# Patient Record
Sex: Female | Born: 1940
Health system: Southern US, Community
[De-identification: ages and names within clinical notes are randomized; demographics above are authoritative.]

## PROBLEM LIST (undated history)

## (undated) DIAGNOSIS — I499 Cardiac arrhythmia, unspecified: Secondary | ICD-10-CM

## (undated) DIAGNOSIS — M1990 Unspecified osteoarthritis, unspecified site: Secondary | ICD-10-CM

## (undated) DIAGNOSIS — I4891 Unspecified atrial fibrillation: Secondary | ICD-10-CM

## (undated) DIAGNOSIS — I639 Cerebral infarction, unspecified: Secondary | ICD-10-CM

## (undated) DIAGNOSIS — R011 Cardiac murmur, unspecified: Secondary | ICD-10-CM

## (undated) DIAGNOSIS — E785 Hyperlipidemia, unspecified: Secondary | ICD-10-CM

## (undated) DIAGNOSIS — I509 Heart failure, unspecified: Secondary | ICD-10-CM

## (undated) DIAGNOSIS — I10 Essential (primary) hypertension: Secondary | ICD-10-CM

## (undated) DIAGNOSIS — F419 Anxiety disorder, unspecified: Secondary | ICD-10-CM

## (undated) HISTORY — DX: Cerebral infarction, unspecified: I63.9

## (undated) HISTORY — DX: Hyperlipidemia, unspecified: E78.5

## (undated) HISTORY — DX: Cardiac arrhythmia, unspecified: I49.9

## (undated) HISTORY — PX: ABDOMINAL HYSTERECTOMY: SHX81

## (undated) HISTORY — PX: TUMOR REMOVAL: SHX12

## (undated) HISTORY — DX: Unspecified osteoarthritis, unspecified site: M19.90

## (undated) HISTORY — PX: APPENDECTOMY: SHX54

## (undated) HISTORY — DX: Unspecified atrial fibrillation: I48.91

## (undated) HISTORY — PX: TONSILLECTOMY: SUR1361

## (undated) HISTORY — PX: BLADDER SUSPENSION: SHX72

## (undated) HISTORY — DX: Anxiety disorder, unspecified: F41.9

## (undated) HISTORY — DX: Heart failure, unspecified: I50.9

---

## 2005-09-02 ENCOUNTER — Ambulatory Visit: Payer: Self-pay | Admitting: Family Medicine

## 2005-09-17 ENCOUNTER — Ambulatory Visit: Payer: Self-pay | Admitting: Family Medicine

## 2005-10-08 ENCOUNTER — Ambulatory Visit: Payer: Self-pay | Admitting: Family Medicine

## 2005-10-17 ENCOUNTER — Ambulatory Visit: Payer: Self-pay | Admitting: Family Medicine

## 2005-10-23 ENCOUNTER — Ambulatory Visit: Payer: Self-pay | Admitting: Family Medicine

## 2006-01-09 ENCOUNTER — Ambulatory Visit: Payer: Self-pay | Admitting: Family Medicine

## 2006-08-04 ENCOUNTER — Encounter (INDEPENDENT_AMBULATORY_CARE_PROVIDER_SITE_OTHER): Payer: Self-pay | Admitting: Internal Medicine

## 2006-12-22 ENCOUNTER — Telehealth (INDEPENDENT_AMBULATORY_CARE_PROVIDER_SITE_OTHER): Payer: Self-pay | Admitting: *Deleted

## 2006-12-26 ENCOUNTER — Ambulatory Visit: Payer: Self-pay | Admitting: Family Medicine

## 2006-12-26 DIAGNOSIS — Z8739 Personal history of other diseases of the musculoskeletal system and connective tissue: Secondary | ICD-10-CM

## 2006-12-26 DIAGNOSIS — M722 Plantar fascial fibromatosis: Secondary | ICD-10-CM | POA: Insufficient documentation

## 2006-12-26 DIAGNOSIS — I10 Essential (primary) hypertension: Secondary | ICD-10-CM | POA: Insufficient documentation

## 2006-12-29 LAB — CONVERTED CEMR LAB
Basophils Absolute: 0 10*3/uL (ref 0.0–0.1)
Creatinine, Ser: 0.7 mg/dL (ref 0.4–1.2)
Direct LDL: 171.2 mg/dL
Eosinophils Absolute: 0.1 10*3/uL (ref 0.0–0.6)
Glucose, Bld: 96 mg/dL (ref 70–99)
HCT: 38.1 % (ref 36.0–46.0)
Hemoglobin: 13 g/dL (ref 12.0–15.0)
MCHC: 34.1 g/dL (ref 30.0–36.0)
MCV: 92.7 fL (ref 78.0–100.0)
Monocytes Absolute: 0.5 10*3/uL (ref 0.2–0.7)
Neutrophils Relative %: 53.4 % (ref 43.0–77.0)
Potassium: 3.9 meq/L (ref 3.5–5.1)
Sodium: 140 meq/L (ref 135–145)

## 2007-01-01 ENCOUNTER — Encounter (INDEPENDENT_AMBULATORY_CARE_PROVIDER_SITE_OTHER): Payer: Self-pay | Admitting: Internal Medicine

## 2007-08-05 ENCOUNTER — Encounter (INDEPENDENT_AMBULATORY_CARE_PROVIDER_SITE_OTHER): Payer: Self-pay | Admitting: Internal Medicine

## 2008-01-06 ENCOUNTER — Telehealth (INDEPENDENT_AMBULATORY_CARE_PROVIDER_SITE_OTHER): Payer: Self-pay | Admitting: Internal Medicine

## 2008-01-12 ENCOUNTER — Ambulatory Visit: Payer: Self-pay | Admitting: Internal Medicine

## 2008-01-13 ENCOUNTER — Encounter (INDEPENDENT_AMBULATORY_CARE_PROVIDER_SITE_OTHER): Payer: Self-pay | Admitting: Internal Medicine

## 2008-01-13 LAB — CONVERTED CEMR LAB
ALT: 55 units/L — ABNORMAL HIGH (ref 0–35)
AST: 43 units/L — ABNORMAL HIGH (ref 0–37)
BUN: 10 mg/dL (ref 6–23)
Chloride: 108 meq/L (ref 96–112)
Direct LDL: 118.8 mg/dL
GFR calc non Af Amer: 107 mL/min
Potassium: 4 meq/L (ref 3.5–5.1)
Sodium: 142 meq/L (ref 135–145)
VLDL: 19 mg/dL (ref 0–40)

## 2008-07-06 ENCOUNTER — Ambulatory Visit: Payer: Self-pay | Admitting: Family Medicine

## 2008-07-11 LAB — CONVERTED CEMR LAB
ALT: 28 units/L (ref 0–35)
AST: 29 units/L (ref 0–37)
Albumin: 4 g/dL (ref 3.5–5.2)
Alkaline Phosphatase: 59 units/L (ref 39–117)
Bilirubin, Direct: 0.1 mg/dL (ref 0.0–0.3)
Cholesterol: 218 mg/dL — ABNORMAL HIGH (ref 0–200)
Total Protein: 6.5 g/dL (ref 6.0–8.3)
VLDL: 14.4 mg/dL (ref 0.0–40.0)

## 2008-07-13 ENCOUNTER — Ambulatory Visit: Payer: Self-pay | Admitting: Family Medicine

## 2008-07-29 ENCOUNTER — Encounter (INDEPENDENT_AMBULATORY_CARE_PROVIDER_SITE_OTHER): Payer: Self-pay | Admitting: Internal Medicine

## 2008-08-02 ENCOUNTER — Encounter (INDEPENDENT_AMBULATORY_CARE_PROVIDER_SITE_OTHER): Payer: Self-pay | Admitting: Internal Medicine

## 2008-08-04 ENCOUNTER — Encounter (INDEPENDENT_AMBULATORY_CARE_PROVIDER_SITE_OTHER): Payer: Self-pay | Admitting: *Deleted

## 2008-08-04 ENCOUNTER — Encounter (INDEPENDENT_AMBULATORY_CARE_PROVIDER_SITE_OTHER): Payer: Self-pay | Admitting: Internal Medicine

## 2009-01-24 ENCOUNTER — Ambulatory Visit: Payer: Self-pay | Admitting: Family Medicine

## 2009-02-02 ENCOUNTER — Ambulatory Visit: Payer: Self-pay | Admitting: Family Medicine

## 2009-02-02 DIAGNOSIS — Z780 Asymptomatic menopausal state: Secondary | ICD-10-CM | POA: Insufficient documentation

## 2009-02-03 ENCOUNTER — Encounter (INDEPENDENT_AMBULATORY_CARE_PROVIDER_SITE_OTHER): Payer: Self-pay | Admitting: Internal Medicine

## 2009-02-03 LAB — CONVERTED CEMR LAB
ALT: 20 units/L (ref 0–35)
AST: 24 units/L (ref 0–37)
Calcium: 9 mg/dL (ref 8.4–10.5)
Direct LDL: 160.7 mg/dL
GFR calc non Af Amer: 88.89 mL/min (ref >60)
Potassium: 3.7 meq/L (ref 3.5–5.1)
Sodium: 141 meq/L (ref 135–145)
Total CHOL/HDL Ratio: 4
VLDL: 16.8 mg/dL (ref 0.0–40.0)

## 2009-02-06 ENCOUNTER — Telehealth (INDEPENDENT_AMBULATORY_CARE_PROVIDER_SITE_OTHER): Payer: Self-pay | Admitting: Internal Medicine

## 2009-03-01 ENCOUNTER — Telehealth (INDEPENDENT_AMBULATORY_CARE_PROVIDER_SITE_OTHER): Payer: Self-pay | Admitting: Internal Medicine

## 2009-03-02 ENCOUNTER — Ambulatory Visit: Payer: Self-pay | Admitting: Family Medicine

## 2009-05-24 ENCOUNTER — Ambulatory Visit: Payer: Self-pay | Admitting: Family Medicine

## 2009-05-24 DIAGNOSIS — J0100 Acute maxillary sinusitis, unspecified: Secondary | ICD-10-CM | POA: Insufficient documentation

## 2009-05-24 DIAGNOSIS — J019 Acute sinusitis, unspecified: Secondary | ICD-10-CM

## 2009-05-24 DIAGNOSIS — H02849 Edema of unspecified eye, unspecified eyelid: Secondary | ICD-10-CM

## 2009-06-16 ENCOUNTER — Ambulatory Visit: Payer: Self-pay | Admitting: Family Medicine

## 2009-06-19 ENCOUNTER — Telehealth: Payer: Self-pay | Admitting: Family Medicine

## 2009-06-27 ENCOUNTER — Telehealth: Payer: Self-pay | Admitting: Family Medicine

## 2009-07-11 ENCOUNTER — Ambulatory Visit: Payer: Self-pay | Admitting: Family Medicine

## 2009-07-11 LAB — CONVERTED CEMR LAB
ALT: 32 units/L (ref 0–35)
AST: 32 units/L (ref 0–37)
Cholesterol: 211 mg/dL — ABNORMAL HIGH (ref 0–200)
Direct LDL: 139.5 mg/dL
HDL: 63.8 mg/dL (ref >39.00)
Total CHOL/HDL Ratio: 3
Triglycerides: 79 mg/dL (ref 0.0–149.0)
VLDL: 15.8 mg/dL (ref 0.0–40.0)

## 2009-07-18 ENCOUNTER — Ambulatory Visit: Payer: Self-pay | Admitting: Family Medicine

## 2009-07-18 LAB — CONVERTED CEMR LAB: LDL Cholesterol: 139 mg/dL

## 2009-07-18 LAB — HM COLONOSCOPY

## 2010-01-17 ENCOUNTER — Ambulatory Visit: Payer: Self-pay | Admitting: Family Medicine

## 2010-01-18 LAB — CONVERTED CEMR LAB
ALT: 19 units/L (ref 0–35)
Alkaline Phosphatase: 64 units/L (ref 39–117)
Bilirubin, Direct: 0 mg/dL (ref 0.0–0.3)
CO2: 27 meq/L (ref 19–32)
Calcium: 9.4 mg/dL (ref 8.4–10.5)
Chloride: 109 meq/L (ref 96–112)
Creatinine, Ser: 0.7 mg/dL (ref 0.4–1.2)
Direct LDL: 152.3 mg/dL
HDL: 62.6 mg/dL (ref >39.00)
Sodium: 143 meq/L (ref 135–145)
Total Bilirubin: 0.4 mg/dL (ref 0.3–1.2)
Total CHOL/HDL Ratio: 4
Total Protein: 6.5 g/dL (ref 6.0–8.3)
Triglycerides: 109 mg/dL (ref 0.0–149.0)

## 2010-01-24 ENCOUNTER — Ambulatory Visit: Payer: Self-pay | Admitting: Family Medicine

## 2010-05-01 NOTE — Progress Notes (Signed)
Summary: rash with BP meds  Phone Note Call from Patient Call back at Home Phone [phone]   Caller: Patient Call For: Kerby Nora MD Summary of Call: Pt states she has been having rashes with BP meds.  She has a rash with metoprolol/ hctz.  She cut this in half on saturday night, which helped ease the rash a little.  She is still taking half a dose.  The medicine did relieve the swelling in her feet and ankles.  Please advise on what she needs to do. Initial call taken by: Lowella Petties CMA,  June 27, 2009 8:36 AM  Follow-up for Phone Call        Stop metoprolol HCTZ..I believe she may have cross reactivity allergy to diuretics given her allergy to sulfa. Change to metoprolol alone without diuretic.  Follow-up by: Kerby Nora MD,  June 27, 2009 11:55 AM  Additional Follow-up for Phone Call Additional follow up Details #1::        Patient advised and will try this medication Additional Follow-up by: Benny Lennert CMA Duncan Dull),  June 27, 2009 12:14 PM    New/Updated Medications: METOPROLOL TARTRATE 50 MG TABS (METOPROLOL TARTRATE) 1 tab by mouth two times a day Prescriptions: METOPROLOL TARTRATE 50 MG TABS (METOPROLOL TARTRATE) 1 tab by mouth two times a day  #60 x 11   Entered and Authorized by:   Kerby Nora MD   Signed by:   Kerby Nora MD on 06/27/2009   Method used:   [facility]        [facility]       [address]       [place], [place]  [address]       Ph: [phone] or [phone]       Fax: [phone]   RxID:   [id number]

## 2010-05-01 NOTE — Progress Notes (Signed)
Summary: swelling with diovan  Phone Note Call from Patient Call back at Home Phone [phone]   Caller: Patient Call For: Kerby Nora MD Summary of Call: Pt states she took diovan on friday and saturday and her face started to swell and get a rash. Her feet are also swelling.  She didnt take this yesterday.  She is asking if you know of something else that she can try.  [facility]. Initial call taken by: Lowella Petties CMA,  June 19, 2009 11:30 AM  Follow-up for Phone Call        If continued swelling in face we will given repeat prednisone taper. Let me know. Continue off diovan. Instead start metoprolol HCT twice daily. Follow up as scheduled with Dr. Patsy Lager in April , but call if BP remains above 140/90.   (Note: if continued allergic symtpoms may be cross reactivity between sulfa allergy and diuretics) Follow-up by: Kerby Nora MD,  June 20, 2009 2:04 PM  Additional Follow-up for Phone Call Additional follow up Details #1::        Patient advised.Consuello Masse CMA  Additional Follow-up by: Benny Lennert CMA Duncan Dull),  June 20, 2009 2:15 PM    New/Updated Medications: METOPROLOL-HYDROCHLOROTHIAZIDE 50-25 MG TABS (METOPROLOL-HYDROCHLOROTHIAZIDE)  METOPROLOL-HYDROCHLOROTHIAZIDE 50-25 MG TABS (METOPROLOL-HYDROCHLOROTHIAZIDE) 1 tab by mouth two times a day Prescriptions: METOPROLOL-HYDROCHLOROTHIAZIDE 50-25 MG TABS (METOPROLOL-HYDROCHLOROTHIAZIDE) 1 tab by mouth two times a day  #60 x 3   Entered and Authorized by:   Kerby Nora MD   Signed by:   Kerby Nora MD on 06/20/2009   Method used:   [facility]        [facility] (retail[facility]       [address]       [place], [place]  [address]       Ph: [phone] or [phone]       Fax: [phone]   RxID:   [id number]   Current Allergies (reviewed today): ! CIPRO ! DYAZIDE ! SULFA ! DIOVAN

## 2010-05-01 NOTE — Letter (Signed)
Summary: [facility]   [facility]   Imported By: Lanelle Bal 07/31/2009 09:12:58  _____________________________________________________________________  External Attachment:    Type:   Image     Comment:   External Document

## 2010-05-01 NOTE — Progress Notes (Signed)
Summary: Med List Brought by Patient  Med List Brought by Patient   Imported By: Lanelle Bal 01/31/2010 08:49:35  _____________________________________________________________________  External Attachment:    Type:   Image     Comment:   External Document

## 2010-05-01 NOTE — Assessment & Plan Note (Signed)
Summary: RASH AND SWOLLEN EYE LID   Vital Signs:  Patient profile:   70 year old female Height:      65.25 inches Weight:      139.38 pounds BMI:     23.10 Temp:     98.0 degrees F oral Pulse rate:   76 / minute Pulse rhythm:   regular BP sitting:   140 / 80  (left arm) Cuff size:   regular  Vitals Entered By: Linde Gillis CMA Duncan Dull) (May 24, 2009 8:02 AM) CC: rash, swollen eye, sinus concerns   History of Present Illness: 70 year old female:  Eyes are swollen, some puffiness,mostly on the left side.   started to get a cold in december. Feels something is not right. Having some discharge. May have some yellowish coloration.   Coughing some. Not so severe.   B eye swelling - mostly on the L, mild swelling, no loss of vision, no throat swelling, no swollen tongue.  Acute Visit History:      The patient complains of cough, eye symptoms, headache, nasal discharge, rash, and sinus problems.  These symptoms began 2 months ago.  She denies earache, fever, nausea, and sore throat.        There is no history of wheezing, sleep interference, shortness of breath, respiratory retractions, tachypnea, cyanosis, or interference with oral intake associated with her cough.        She complains of sinus pressure, nasal congestion, purulent drainage, and frontal headache.  The patient has had a past history of sinusitis.  She denies previous sinus surgery.        Urine output has been normal.  She is tolerating clear liquids.        Allergies: 1)  ! Cipro 2)  ! Dyazide 3)  ! Sulfa 4)  ! Diovan  Past History:  Past medical, surgical, family and social histories (including risk factors) reviewed, and no changes noted (except as noted below).  Past Surgical History: Reviewed history from 01/24/2009 and no changes required. colonoscopy --4/04--polyp complete hysterectomy at age 21 for severe endometriosis  Family History: Reviewed history from 12/26/2006 and no changes  required. Father: died at a ge 67--colon cancer Mother:   osteoporosis, valvular heart disease Siblings: 1 br--Chron's   DM- Maunt MI-  CVA- mother's side Prostate Cancer- 0 Breast Cancer- [place] Ovarian Cancer- 0 Uterine Cancer- 0 Colon Cancer- multiple on father's side Drug/ ETOH Abuse- 0 Depression-   Social History: Reviewed history from 12/26/2006 and no changes required. Marital Status: Married Children: 2 sons Occupation: retired Tourist information centre manager.   Review of Systems General:  Complains of fatigue; denies chills and fever. ENT:  See HPI. Derm:  See HPI.  Physical Exam  General:  Well-developed,well-nourished,in no acute distress; alert,appropriate and cooperative throughout examination Head:  Normocephalic and atraumatic without obvious abnormalities. No apparent alopecia or balding. Eyes:  vision grossly intact, pupils equal, pupils round, and pupils reactive to light.    EYELID SWELLING, RELATIVELY MILD, L > R.  Ears:  External ear exam shows no significant lesions or deformities.  Otoscopic examination reveals clear canals, tympanic membranes are intact bilaterally without bulging, retraction, inflammation or discharge. Hearing is grossly normal bilaterally. Nose:  External nasal examination shows no deformity or inflammation. Nasal mucosa are pink and moist without lesions or exudates. Mouth:  Oral mucosa and oropharynx without lesions or exudates.  Teeth in good repair. Neck:  No deformities, masses, or tenderness noted. Lungs:  Normal respiratory effort, chest expands symmetrically.  Lungs are clear to auscultation, no crackles or wheezes. Heart:  Normal rate and regular rhythm. S1 and S2 normal without gallop, murmur, click, rub or other extra sounds. Extremities:  No clubbing, cyanosis, edema, or deformity noted with normal full range of motion of all joints.   Cervical Nodes:  No lymphadenopathy noted Psych:  Cognition and judgment appear intact. Alert and cooperative  with normal attention span and concentration. No apparent delusions, illusions, hallucinations   Impression & Recommendations:  Problem # 1:  SINUSITIS - ACUTE-NOS (ICD-461.9) Assessment New  high dose amox, 1500 mg two times a day     The following medications were removed from the medication list:    Amoxicillin 500 Mg Caps (Amoxicillin) .Marland Kitchen... Take 2 two times a day Her updated medication list for this problem includes:    Amoxicillin 875 Mg Tabs (Amoxicillin) .Marland Kitchen... 1 by mouth two times a day  Instructed on treatment. Call if symptoms persist or worsen.   Problem # 2:  EDEMA, EYELID (ICD-374.82) Assessment: New Concerning -- the patient is on a tremendous number of supplements, this could be very mild angioedema, so I am stopping her ARB.  Could also be a reaction to supplement -- on some she does not know the name of as well  Problem # 3:  HYPERTENSION, BENIGN ESSENTIAL (ICD-401.1) recheck with Dr. Ermalene Searing  The following medications were removed from the medication list:    Diovan 160 Mg Tabs (Valsartan) .Marland Kitchen... Take 1 tablet by mouth once a day Her updated medication list for this problem includes:    Norvasc 10 Mg Tabs (Amlodipine besylate) .Marland Kitchen... 1 by mouth daily  Complete Medication List: 1)  Glucosamine-chondroitin 500-400 Mg Tabs (Glucosamine-chondroitin) .... Take 1 tablet by mouth two times a day 2)  [facility] Evening Primrose Oil 500 Mg Caps (Evening primrose oil) .... Take 4 capsules  by mouth once a day 3)  Silymarin Caps (Milk thistle-turmeric) .... Take 2 capsule by mouth once a day 4)  Bilberry Extract 40 Mg Caps (Bilberry-bioflav-rutin-quercet) .... Take 2 capsules  by mouth once a day 5)  Ginger 500 Mg Caps (Ginger (zingiber officinalis)) .... Take 2 tablets by mouth once a day 6)  Azo-cranberry 450 Mg Tabs (Cranberry) .... Take 1 tablet by mouth once a day 7)  Olive Leaf Extract 500 Mg Caps (Olive leaf) .... Take 1 capsule by mouth once a day 8)  Daily Multiple  Vitamins Tabs (Multiple vitamin) .... Take 3 by mouth every am and pm 9)  Probiotic Caps (Misc intestinal flora regulat) .... Take 2 tablet by mouth once a day 10)  Absinthium Oil ([place] (absinthium) oil) .... As directed 11)  Glutamine 500 Mg Caps (Glutamine) .... Take 1 tablet by mouth once a day 12)  Cinnamon 500 Mg Caps (Cinnamon) .... Take 4 capsules by mouth once a day 13)  Colon Care  .... Take 3 by mouth every pm 14)  Bone-up Build Bone Density  .... Take 2 by mouth two times a day 15)  Vitamin C 1000 Mg Tabs (Ascorbic acid) .... Take one by mouth two times a day 16)  Cayne Pepper Tabs  .... One twice a day 17)  Clotrimazole-betamethasone 1-0.05 % Crea (Clotrimazole-betamethasone) .... Use sparingly two times a day to rash as needed 18)  Amoxicillin 875 Mg Tabs (Amoxicillin) .Marland Kitchen.. 1 by mouth two times a day 19)  Norvasc 10 Mg Tabs (Amlodipine besylate) .Marland Kitchen.. 1 by mouth daily  Patient Instructions: 1)  Benadryl 1 tablet by mouth  q 6 hours for the next few days Prescriptions: NORVASC 10 MG TABS (AMLODIPINE BESYLATE) 1 by mouth daily  #30 x 5   Entered and Authorized by:   Hannah Beat MD   Signed by:   Hannah Beat MD on 05/24/2009   Method used:   [facility]        [id number]       [address]       [place], [place]  [address]       Ph: [phone] or [phone]       Fax: [phone]   RxID:   [id number] AMOXICILLIN 875 MG TABS (AMOXICILLIN) 1 by mouth two times a day  #20 x 0   Entered and Authorized by:   Hannah Beat MD   Signed by:   Hannah Beat MD on 05/24/2009   Method used:   [facility]        [facility]  [address] (retail)       [address]       [place], [place]  [address]       Ph: [phone] or [phone]       Fax: [phone]   RxID:   [id number]   Current Allergies (reviewed today): ! CIPRO ! DYAZIDE ! SULFA ! DIOVAN

## 2010-05-01 NOTE — Assessment & Plan Note (Signed)
Summary: CPX / LFW   Vital Signs:  Patient profile:   70 year old female Height:      65.25 inches Weight:      137.6 pounds BMI:     22.80 Temp:     97.8 degrees F oral Pulse rate:   76 / minute Pulse rhythm:   regular BP sitting:   160 / 98  (left arm) Cuff size:   regular  Vitals Entered By: Benny Lennert CMA Duncan Dull) (July 18, 2009 9:59 AM)  History of Present Illness: Chief complaint cpx  The patient is here for annual wellness exam and preventative care.    HTN, continued poor control on metoprolol. Stopped diovan/HCTZ and metoprolol HCTZ for allergic reaction/angioedema thought to be due to ARB, then may thought to be due to cross reactivity to HCTZ given sulfa allergy.   She felt she had worse symtpoms on metoprolol alone... so she has stopped all BP medication entirely.  BP poorly controlled now.   She states that she will not go through that again..refuses any BP medication despite risk. Has been using dandelion and CoQ 10. Plans on going to naturalist for BP treatment.   See Dr. Gavin Potters for RA..will do bone density  Problems Prior to Update: 1)  Sinusitis - Acute-nos  (ICD-461.9) 2)  Edema, Eyelid  (ICD-374.82) 3)  Postmenopausal Status  (ICD-V49.81) 4)  Health Screening  (ICD-V70.0) 5)  Other Screening Mammogram  (ICD-V76.12) 6)  Arthritis, Rheumatoid  (ICD-714.0) 7)  Hyperlipidemia  (ICD-272.2) 8)  Hypertension, Benign Essential  (ICD-401.1) 9)  Plantar Fasciitis  (ICD-728.71)  Current Medications (verified): 1)  Naprosyn 500 Mg Tabs (Naproxen) .... If Needed One Tablet 2 Times Daily 2)  Astepro 0.15 % Soln (Azelastine Hcl) .... One Spray in The Pm 3)  Gr8- Dophilus .... Daily 4)  Cholest Off 450 Mg Tabs (Plant Sterols and Stanols) .... Daily 5)  Colon Care .... Daily 6)  Quercetin 50 Mg Tabs (Quercetin) .... Sometimes 7)  Serrapeptase 8)  Bee Pollen 580 Mg Caps (Bee Pollen) .... Daily 9)  Oil of Oregano 1500 Mg Caps (Oregano) .... Daily 10)   Multivitamins  Caps (Multiple Vitamin) .... Daily 11)  Bone Up .... Daily 12)  Osteo Bi-Flex Adv Double St  Tabs (Misc Natural Products) .... Daily 13)  Cissus 14)  Ginger Root 500 Mg Caps (Ginger (Zingiber Officinalis)) .... Daily 15)  Urinary Tract Health 365-50 Mg Caps (Cranberry-Olive Leaf) .... Daily in Am 16)  Milk Thistle 300 Mg Caps (Milk Thistle) .... Daily 17)  Lutein 8 Mg Caps (Lutein) .... Daily For Eyes 18)  Evening Primrose Oil 500 Mg Caps (Evening Primrose Oil) .... 1000mg  Nightly 19)  Ashwagandha 20)  Black Cohosh 540 Mg Caps (Black Cohosh) .... Once Daily 21)  Red Wine Extract Plus  Caps (Misc Natural Products) .... 200mg  Daily 22)  Super-C 1000  Tabs (Bioflavonoid Products) .... 2000 Mg Daily 23)  Magnesium Oxide 250 Mg Tabs (Magnesium Oxide) .... Daily 24)  Ayr 0.65 % Soln (Saline) .... Sometime 25)  Saline Allergy With Butterbur Extract .... Sometimes 26)  Now Activated Nasal With Erythritol and Xylitol and Natural .... Sometimes 27)  Co-Enzyme Q-10 30 Mg Caps (Coenzyme Q10) .... Daily 28)  Dandelion Root 520 Mg Caps (Misc Natural Products) .... Daily 29)  Cinnamon 500 Mg Tabs (Cinnamon) .... Daily  Allergies: 1)  ! Cipro 2)  ! Dyazide 3)  ! Sulfa 4)  ! Diovan  Past History:  Past medical, surgical, family  and social histories (including risk factors) reviewed, and no changes noted (except as noted below).  Past Surgical History: Reviewed history from 01/24/2009 and no changes required. colonoscopy --4/04--polyp complete hysterectomy at age 27 for severe endometriosis  Family History: Reviewed history from 12/26/2006 and no changes required. Father: died at a ge 67--colon cancer Mother:   osteoporosis, valvular heart disease Siblings: 1 br--Chron's   DM- Maunt MI-  CVA- mother's side Prostate Cancer- 0 Breast Cancer- [place] Ovarian Cancer- 0 Uterine Cancer- 0 Colon Cancer- multiple on father's side Drug/ ETOH Abuse- 0 Depression-   Social  History: Reviewed history from 12/26/2006 and no changes required. Marital Status: Married Children: 2 sons Occupation: retired Tourist information centre manager.   Review of Systems General:  Denies fatigue and fever. CV:  Denies chest pain or discomfort. Resp:  Denies shortness of breath. GI:  Denies abdominal pain. GU:  Denies dysuria.  Physical Exam  General:  Well-developed,well-nourished,in no acute distress; alert,appropriate and cooperative throughout examination Eyes:  vision grossly intact, pupils equal, pupils round, and pupils reactive to light.    EYELID SWELLING, RELATIVELY MILD, L > R.  Ears:  External ear exam shows no significant lesions or deformities.  Otoscopic examination reveals clear canals, tympanic membranes are intact bilaterally without bulging, retraction, inflammation or discharge. Hearing is grossly normal bilaterally. Nose:  External nasal examination shows no deformity or inflammation. Nasal mucosa are pink and moist without lesions or exudates. Mouth:  Oral mucosa and oropharynx without lesions or exudates.  Teeth in good repair. Neck:  no carotid bruit or thyromegaly no cervical or supraclavicular lymphadenopathy  Chest Wall:  No deformities, masses, or tenderness noted. Breasts:  No mass, nodules, thickening, tenderness, bulging, retraction, inflamation, nipple discharge or skin changes noted.   Lungs:  Normal respiratory effort, chest expands symmetrically. Lungs are clear to auscultation, no crackles or wheezes. Heart:  Normal rate and regular rhythm. S1 and S2 normal without gallop, murmur, click, rub or other extra sounds. Abdomen:  Bowel sounds positive,abdomen soft and non-tender without masses, organomegaly or hernias noted. Genitalia:  No indication given complete hysterectomy Pulses:  R and L posterior tibial pulses are full and equal bilaterally  Extremities:  no edema  Psych:  slightly anxious.     Impression & Recommendations:  Problem # 1:  HYPERTENSION, BENIGN  ESSENTIAL (ICD-401.1) Poor control, but refuses any medication..understands risksof HTN, poor control...wishes to try natural treatment. Will call if interested in treatment.  Spent >than 30- min in discussion with [pt and coordination of care.  The following medications were removed from the medication list:    Diovan Hct 160-12.5 Mg Tabs (Valsartan-hydrochlorothiazide) .Marland Kitchen... Take 1 tablet by mouth once a day    Metoprolol Tartrate 50 Mg Tabs (Metoprolol tartrate) .Marland Kitchen... 1 tab by mouth two times a day  Problem # 2:  HYPERLIPIDEMIA (ICD-272.2) Well controlled. Encouraged exercise, weight loss, healthy eating habits.  Labs Reviewed: SGOT: 32 (07/11/2009)   SGPT: 32 (07/11/2009)   HDL:63.80 (07/11/2009), 57.50 (02/02/2009)  LDL:139 (07/18/2009), DEL (01/12/2008)  Chol:211 (07/11/2009), 234 (02/02/2009)  Trig:79.0 (07/11/2009), 84.0 (02/02/2009)  Problem # 3:  Preventive Health Care (ICD-V70.0) Assessment: Comment Only Reviewed preventive care protocols, scheduled due services, and updated immunizations.   Complete Medication List: 1)  Naprosyn 500 Mg Tabs (Naproxen) .... If needed one tablet 2 times daily 2)  Astepro 0.15 % Soln (Azelastine hcl) .... One spray in the pm 3)  Gr8- Dophilus  .... Daily 4)  Cholest Off 450 Mg Tabs (Plant sterols and stanols) .Marland KitchenMarland KitchenMarland Kitchen  Daily 5)  Colon Care  .... Daily 6)  Quercetin 50 Mg Tabs (Quercetin) .... Sometimes 7)  Serrapeptase  8)  Bee Pollen 580 Mg Caps (Bee pollen) .... Daily 9)  Oil of Oregano 1500 Mg Caps (Oregano) .... Daily 10)  Multivitamins Caps (Multiple vitamin) .... Daily 11)  Bone Up  .... Daily 12)  Osteo Bi-flex Adv Double St Tabs (Misc natural products) .... Daily 13)  Cissus  14)  Ginger Root 500 Mg Caps (Ginger (zingiber officinalis)) .... Daily 15)  Urinary Tract Health 365-50 Mg Caps (Cranberry-olive leaf) .... Daily in am 16)  Milk Thistle 300 Mg Caps (Milk thistle) .... Daily 17)  Lutein 8 Mg Caps (Lutein) .... Daily for eyes 18)   Evening Primrose Oil 500 Mg Caps (Evening primrose oil) .... 1000mg  nightly 19)  Ashwagandha  20)  Black Cohosh 540 Mg Caps (Black cohosh) .... Once daily 21)  Red Wine Extract Plus Caps (Misc natural products) .... 200mg  daily 22)  Super-c 1000 Tabs (Bioflavonoid products) .... 2000 mg daily 23)  Magnesium Oxide 250 Mg Tabs (Magnesium oxide) .... Daily 24)  Ayr 0.65 % Soln (Saline) .... Sometime 25)  Saline Allergy With Butterbur Extract  .... Sometimes 26)  Now Activated Nasal With Erythritol and Xylitol and Natural  .... Sometimes 27)  Co-enzyme Q-10 30 Mg Caps (Coenzyme q10) .... Daily 28)  Dandelion Root 520 Mg Caps (Misc natural products) .... Daily 29)  Cinnamon 500 Mg Tabs (Cinnamon) .... Daily  Patient Instructions: 1)  Please schedule a follow-up appointment in 6 months  30 min.  2)  BMP prior to visit, ICD-9: 272.0 3)  Hepatic Panel prior to visit ICD-9:  4)  Lipid panel prior to visit ICD-9 :  5)  Call for nurse visit if interested in shingles vaccine or pneumonia vaccine.Call if interested in BP treatment.   Current Allergies (reviewed today): ! CIPRO ! DYAZIDE ! SULFA ! DIOVAN  Last HDL:  63.80 (07/11/2009 10:13:26 AM) HDL Next Due:  6 mo Last LDL:  DEL (01/12/2008 11:05:00 AM) LDL Result Date:  07/18/2009 LDL Result:  139 LDL Next Due:  6 mo Flex Sig Next Due:  Not Indicated Hemoccult Next Due:  Not Indicated Last PAP:  normal (04/01/1978 6:06:06 PM) PAP Next Due:  Not Indicated Last Mammogram:  normal (08/04/2008 11:34:02 AM) Mammogram Next Due:  Refused     Past Surgical History:    Reviewed history from 01/24/2009 and no changes required:       colonoscopy --4/04--polyp       complete hysterectomy at age 49 for severe endometriosis

## 2010-05-01 NOTE — Assessment & Plan Note (Signed)
Summary: 8 M F/U 30 MIN PER MD/DLO   Vital Signs:  Patient profile:   70 year old female Height:      65.25 inches Weight:      138.0 pounds BMI:     22.87 Temp:     97.7 degrees F oral Pulse rate:   76 / minute Pulse rhythm:   regular BP sitting:   150 / 90  (left arm) Cuff size:   regular  Vitals Entered By: Benny Lennert CMA Duncan Dull) (January 24, 2010 10:55 AM)  History of Present Illness: Chief complaint 6 month follow up    HTN, inadequate control.Marland KitchenMarland KitchenShe has ben using natural meds to treat (she is on probably 69 natural suplements).. refuses prescription due to past SE and intolerance.  Was not exercising regularly  Problems Prior to Update: 1)  Sinusitis - Acute-nos  (ICD-461.9) 2)  Edema, Eyelid  (ICD-374.82) 3)  Postmenopausal Status  (ICD-V49.81) 4)  Health Screening  (ICD-V70.0) 5)  Other Screening Mammogram  (ICD-V76.12) 6)  Arthritis, Rheumatoid  (ICD-714.0) 7)  Hyperlipidemia  (ICD-272.2) 8)  Hypertension, Benign Essential  (ICD-401.1) 9)  Plantar Fasciitis  (ICD-728.71)  Current Medications (verified): 1)  Naprosyn 500 Mg Tabs (Naproxen) .... If Needed One Tablet 2 Times Daily 2)  Astepro 0.15 % Soln (Azelastine Hcl) .... One Spray in The Pm 3)  Gr8- Dophilus .... Daily 4)  Cholest Off 450 Mg Tabs (Plant Sterols and Stanols) .... Daily 5)  Colon Care .... Daily 6)  Quercetin 50 Mg Tabs (Quercetin) .... Sometimes 7)  Serrapeptase 8)  Bee Pollen 580 Mg Caps (Bee Pollen) .... Daily 9)  Oil of Oregano 1500 Mg Caps (Oregano) .... Daily 10)  Multivitamins  Caps (Multiple Vitamin) .... Daily 11)  Bone Up .... Daily 12)  Osteo Bi-Flex Adv Double St  Tabs (Misc Natural Products) .... Daily 13)  Cissus 14)  Ginger Root 500 Mg Caps (Ginger (Zingiber Officinalis)) .... Daily 15)  Urinary Tract Health 365-50 Mg Caps (Cranberry-Olive Leaf) .... Daily in Am 16)  Milk Thistle 300 Mg Caps (Milk Thistle) .... Daily 17)  Lutein 8 Mg Caps (Lutein) .... Daily For Eyes 18)   Evening Primrose Oil 500 Mg Caps (Evening Primrose Oil) .... 1000mg  Nightly 19)  Ashwagandha 20)  Black Cohosh 540 Mg Caps (Black Cohosh) .... Once Daily 21)  Red Wine Extract Plus  Caps (Misc Natural Products) .... 200mg  Daily 22)  Super-C 1000  Tabs (Bioflavonoid Products) .... 2000 Mg Daily 23)  Magnesium Oxide 250 Mg Tabs (Magnesium Oxide) .... Daily 24)  Ayr 0.65 % Soln (Saline) .... Sometime 25)  Saline Allergy With Butterbur Extract .... Sometimes 26)  Now Activated Nasal With Erythritol and Xylitol and Natural .... Sometimes 27)  Co-Enzyme Q-10 30 Mg Caps (Coenzyme Q10) .... Daily 28)  Dandelion Root 520 Mg Caps (Misc Natural Products) .... Daily 29)  Cinnamon 500 Mg Tabs (Cinnamon) .... Daily 30)  Bell .... Blood Pressure Combo 31)  Fiber Smart .Marland Kitchen.. 1 Scoop Every Am 32)  Lechithin .... One- Once or Twice Daily 33)  Tumeric .... One Tablet 2 Times Daily 34)  Olive Leaf Extract 500 Mg Caps (Olive Leaf) .... One Tablet Daily 35)  Cranberry Fruit 405 Mg Caps (Cranberry) .... One Daily 36)  Evening Primrose Oil 1000 Mg Caps (Evening Primrose Oil) .... One Tablet Dialy  Allergies (verified): 1)  ! Cipro 2)  ! Dyazide 3)  ! Sulfa 4)  ! Diovan  Past History:  Past medical, surgical, family  and social histories (including risk factors) reviewed, and no changes noted (except as noted below).  Past Surgical History: Reviewed history from 01/24/2009 and no changes required. colonoscopy --4/04--polyp complete hysterectomy at age 64 for severe endometriosis  Family History: Reviewed history from 12/26/2006 and no changes required. Father: died at a ge 67--colon cancer Mother:   osteoporosis, valvular heart disease Siblings: 1 br--Chron's   DM- Maunt MI-  CVA- mother's side Prostate Cancer- 0 Breast Cancer- [place] Ovarian Cancer- 0 Uterine Cancer- 0 Colon Cancer- multiple on father's side Drug/ ETOH Abuse- 0 Depression-   Social History: Reviewed history from  12/26/2006 and no changes required. Marital Status: Married Children: 2 sons Occupation: retired Tourist information centre manager.   Review of Systems General:  Denies fatigue and fever. CV:  Denies chest pain or discomfort; Recent palpitations with allergies  cayenne pepper. Resp:  Denies shortness of breath. GI:  Denies abdominal pain. GU:  Denies dysuria.  Physical Exam  General:  Well-developed,well-nourished,in no acute distress; alert,appropriate and cooperative throughout examination Nose:  External nasal examination shows no deformity or inflammation. Nasal mucosa are pink and moist without lesions or exudates. Mouth:  Oral mucosa and oropharynx without lesions or exudates.  Teeth in good repair. Neck:  no carotid bruit or thyromegaly no cervical or supraclavicular lymphadenopathy  Lungs:  Normal respiratory effort, chest expands symmetrically. Lungs are clear to auscultation, no crackles or wheezes. Heart:  Normal rate and regular rhythm. S1 and S2 normal without gallop, murmur, click, rub or other extra sounds. Abdomen:  Bowel sounds positive,abdomen soft and non-tender without masses, organomegaly or hernias noted. Pulses:  R and L posterior tibial pulses are full and equal bilaterally  Extremities:  no edema Skin:  Intact without suspicious lesions or rashes Psych:  Cognition and judgment appear intact. Alert and cooperative with normal attention span and concentration. No apparent delusions, illusions, hallucinations   Impression & Recommendations:  Problem # 1:  HYPERTENSION, BENIGN ESSENTIAL (ICD-401.1) poor control.Charolette Forward refuses prescription medication...despite risk of complications. Will scontinue to treat naturally.   Problem # 2:  HYPERLIPIDEMIA (ICD-272.2) Inadequate control...counseld on lifestyle and diet changes.  Complete Medication List: 1)  Naprosyn 500 Mg Tabs (Naproxen) .... If needed one tablet 2 times daily 2)  Astepro 0.15 % Soln (Azelastine hcl) .... One spray in  the pm 3)  Gr8- Dophilus  .... Daily 4)  Cholest Off 450 Mg Tabs (Plant sterols and stanols) .... Daily 5)  Colon Care  .... Daily 6)  Quercetin 50 Mg Tabs (Quercetin) .... Sometimes 7)  Serrapeptase  8)  Bee Pollen 580 Mg Caps (Bee pollen) .... Daily 9)  Oil of Oregano 1500 Mg Caps (Oregano) .... Daily 10)  Multivitamins Caps (Multiple vitamin) .... Daily 11)  Bone Up  .... Daily 12)  Osteo Bi-flex Adv Double St Tabs (Misc natural products) .... Daily 13)  Cissus  14)  Ginger Root 500 Mg Caps (Ginger (zingiber officinalis)) .... Daily 15)  Urinary Tract Health 365-50 Mg Caps (Cranberry-olive leaf) .... Daily in am 16)  Milk Thistle 300 Mg Caps (Milk thistle) .... Daily 17)  Lutein 8 Mg Caps (Lutein) .... Daily for eyes 18)  Evening Primrose Oil 500 Mg Caps (Evening primrose oil) .... 1000mg  nightly 19)  Ashwagandha  20)  Black Cohosh 540 Mg Caps (Black cohosh) .... Once daily 21)  Red Wine Extract Plus Caps (Misc natural products) .... 200mg  daily 22)  Super-c 1000 Tabs (Bioflavonoid products) .... 2000 mg daily 23)  Magnesium Oxide 250 Mg Tabs (  Magnesium oxide) .... Daily 24)  Ayr 0.65 % Soln (Saline) .... Sometime 25)  Saline Allergy With Butterbur Extract  .... Sometimes 26)  Now Activated Nasal With Erythritol and Xylitol and Natural  .... Sometimes 27)  Co-enzyme Q-10 30 Mg Caps (Coenzyme q10) .... Daily 28)  Dandelion Root 520 Mg Caps (Misc natural products) .... Daily 29)  Cinnamon 500 Mg Tabs (Cinnamon) .... Daily 30)  Bell  .... Blood pressure combo 31)  Fiber Smart  .Marland Kitchen.. 1 scoop every am 32)  Lechithin  .... One- once or twice daily 33)  Tumeric  .... One tablet 2 times daily 34)  Olive Leaf Extract 500 Mg Caps (Olive leaf) .... One tablet daily 35)  Cranberry Fruit 405 Mg Caps (Cranberry) .... One daily 36)  Evening Primrose Oil 1000 Mg Caps (Evening primrose oil) .... One tablet dialy  Patient Instructions: 1)  Work on low salt siet. 2)   Decrease cheese in  diet..change to 2% cheese, stop butter. 3)   Increase exercisie. 4)  Scheduled CPX in 6 months with fasting labs pror Dx 2 72.0CMEt, lipids  5)  call if interested in prescripton med for high blood pressure.    Orders Added: 1)  Est. Patient Level IV [04540]    Prior Medications: NAPROSYN 500 MG TABS (NAPROXEN) if needed one tablet 2 times daily ASTEPRO 0.15 % SOLN (AZELASTINE HCL) one spray in the pm GR8- DOPHILUS () daily CHOLEST OFF 450 MG TABS (PLANT STEROLS AND STANOLS) daily COLON CARE () daily QUERCETIN 50 MG TABS (QUERCETIN) sometimes SERRAPEPTASE ()  BEE POLLEN 580 MG CAPS (BEE POLLEN) daily OIL OF OREGANO 1500 MG CAPS (OREGANO) daily MULTIVITAMINS  CAPS (MULTIPLE VITAMIN) Daily BONE UP () daily OSTEO BI-FLEX ADV DOUBLE ST  TABS (MISC NATURAL PRODUCTS) daily CISSUS ()  GINGER ROOT 500 MG CAPS (GINGER (ZINGIBER OFFICINALIS)) DAILY URINARY TRACT HEALTH 365-50 MG CAPS (CRANBERRY-OLIVE LEAF) DAILY IN AM MILK THISTLE 300 MG CAPS (MILK THISTLE) DAILY LUTEIN 8 MG CAPS (LUTEIN) DAILY FOR EYES EVENING PRIMROSE OIL 500 MG CAPS (EVENING PRIMROSE OIL) 1000MG  NIGHTLY ASHWAGANDHA ()  BLACK COHOSH 540 MG CAPS (BLACK COHOSH) ONCE DAILY RED WINE EXTRACT PLUS  CAPS (MISC NATURAL PRODUCTS) 200mg  daily SUPER-C 1000  TABS (BIOFLAVONOID PRODUCTS) 2000 mg daily MAGNESIUM OXIDE 250 MG TABS (MAGNESIUM OXIDE) daily AYR 0.65 % SOLN (SALINE) sometime SALINE ALLERGY WITH BUTTERBUR EXTRACT () sometimes NOW ACTIVATED NASAL WITH ERYTHRITOL AND XYLITOL AND NATURAL () sometimes CO-ENZYME Q-10 30 MG CAPS (COENZYME Q10) daily DANDELION ROOT 520 MG CAPS (MISC NATURAL PRODUCTS) daily CINNAMON 500 MG TABS (CINNAMON) daily Current Allergies (reviewed today): ! CIPRO ! DYAZIDE ! SULFA ! DIOVAN  Flu Vaccine Next Due:  Refused Pneumovax Next Due:  Refused Herpes Zoster Next Due:  Refused

## 2010-05-01 NOTE — Assessment & Plan Note (Signed)
Summary: ROA 3 WKS  CYD   Vital Signs:  Patient profile:   70 year old female Height:      65.25 inches Weight:      138.6 pounds BMI:     22.97 Temp:     97.5 degrees F oral Pulse rate:   76 / minute Pulse rhythm:   regular BP sitting:   140 / 80  (left arm) Cuff size:   regular  Vitals Entered By: Benny Lennert CMA Duncan Dull) (June 16, 2009 11:54 AM)  History of Present Illness: Chief complaint 3 wk follow up  HTN, poor control..changed BP meds from ACE to norvasc due to ? angioedema 1 months ago Has increase in peripheral swelling. Bp not as well controlled on this new medicaiton.  Felt better on diovan overall.    Took amoxicillin for sinus infection, no improvement Saw ENT.. Dr. Andee Poles..gave rx's for astepro, clarithromycin and prednisone. HAs issue with chronic recurrent sinusitis. No further swelling in face.     Problems Prior to Update: 1)  Sinusitis - Acute-nos  (ICD-461.9) 2)  Edema, Eyelid  (ICD-374.82) 3)  Postmenopausal Status  (ICD-V49.81) 4)  Health Screening  (ICD-V70.0) 5)  Other Screening Mammogram  (ICD-V76.12) 6)  Arthritis, Rheumatoid  (ICD-714.0) 7)  Hyperlipidemia  (ICD-272.2) 8)  Hypertension, Benign Essential  (ICD-401.1) 9)  Plantar Fasciitis  (ICD-728.71)  Current Medications (verified): 1)  Naprosyn 500 Mg Tabs (Naproxen) .... If Needed One Tablet 2 Times Daily 2)  Astepro 0.15 % Soln (Azelastine Hcl) .... One Spray in The Pm 3)  Gr8- Dophilus .... Daily 4)  Cholest Off 450 Mg Tabs (Plant Sterols and Stanols) .... Daily 5)  Colon Care .... Daily 6)  Quercetin 50 Mg Tabs (Quercetin) .... Sometimes 7)  Serrapeptase 8)  Bee Pollen 580 Mg Caps (Bee Pollen) .... Daily 9)  Oil of Oregano 1500 Mg Caps (Oregano) .... Daily 10)  Multivitamins  Caps (Multiple Vitamin) .... Daily 11)  Bone Up .... Daily 12)  Osteo Bi-Flex Adv Double St  Tabs (Misc Natural Products) .... Daily 13)  Cissus 14)  Ginger Root 500 Mg Caps (Ginger (Zingiber  Officinalis)) .... Daily 15)  Urinary Tract Health 365-50 Mg Caps (Cranberry-Olive Leaf) .... Daily in Am 16)  Milk Thistle 300 Mg Caps (Milk Thistle) .... Daily 17)  Lutein 8 Mg Caps (Lutein) .... Daily For Eyes 18)  Evening Primrose Oil 500 Mg Caps (Evening Primrose Oil) .... 1000mg  Nightly 19)  Ashwagandha 20)  Black Cohosh 540 Mg Caps (Black Cohosh) .... Once Daily 21)  Red Wine Extract Plus  Caps (Misc Natural Products) .... 200mg  Daily 22)  Super-C 1000  Tabs (Bioflavonoid Products) .... 2000 Mg Daily 23)  Magnesium Oxide 250 Mg Tabs (Magnesium Oxide) .... Daily 24)  Ayr 0.65 % Soln (Saline) .... Sometime 25)  Saline Allergy With Butterbur Extract .... Sometimes 26)  Now Activated Nasal With Erythritol and Xylitol and Natural .... Sometimes 27)  Diovan Hct 160-12.5 Mg Tabs (Valsartan-Hydrochlorothiazide) .... Take 1 Tablet By Mouth Once A Day  Allergies: 1)  ! Cipro 2)  ! Dyazide 3)  ! Sulfa 4)  ! Diovan  Past History:  Past medical, surgical, family and social histories (including risk factors) reviewed, and no changes noted (except as noted below).  Past Surgical History: Reviewed history from 01/24/2009 and no changes required. colonoscopy --4/04--polyp complete hysterectomy at age 77 for severe endometriosis  Family History: Reviewed history from 12/26/2006 and no changes required. Father: died at a ge 67--colon  cancer Mother:   osteoporosis, valvular heart disease Siblings: 1 br--Chron's   DM- Maunt MI-  CVA- mother's side Prostate Cancer- 0 Breast Cancer- [place] Ovarian Cancer- 0 Uterine Cancer- 0 Colon Cancer- multiple on father's side Drug/ ETOH Abuse- 0 Depression-   Social History: Reviewed history from 12/26/2006 and no changes required. Marital Status: Married Children: 2 sons Occupation: retired Tourist information centre manager.   Review of Systems General:  Denies fatigue and fever. CV:  Denies chest pain or discomfort. Resp:  Denies shortness of breath.  Physical  Exam  General:  Well-developed,well-nourished,in no acute distress; alert,appropriate and cooperative throughout examination Head:  no maxillary sinus ttp  Ears:  External ear exam shows no significant lesions or deformities.  Otoscopic examination reveals clear canals, tympanic membranes are intact bilaterally without bulging, retraction, inflammation or discharge. Hearing is grossly normal bilaterally. Nose:  External nasal examination shows no deformity or inflammation. Nasal mucosa are pink and moist without lesions or exudates. Mouth:  Oral mucosa and oropharynx without lesions or exudates.  Teeth in good repair. Neck:  no carotid bruit or thyromegaly no cervical or supraclavicular lymphadenopathy  Lungs:  Normal respiratory effort, chest expands symmetrically. Lungs are clear to auscultation, no crackles or wheezes. Heart:  Normal rate and regular rhythm. S1 and S2 normal without gallop, murmur, click, rub or other extra sounds. Pulses:  R and L posterior tibial pulses are full and equal bilaterally  Extremities:  no edema    Impression & Recommendations:  Problem # 1:  HYPERTENSION, BENIGN ESSENTIAL (ICD-401.1) Bothersome peripheral edema on amlodipine..recommend trial to restart diovan, but if any facial swelling..stop immediately.  Stop norvasc.  The following medications were removed from the medication list:    Norvasc 10 Mg Tabs (Amlodipine besylate) .Marland Kitchen... 1 by mouth daily Her updated medication list for this problem includes:    Diovan Hct 160-12.5 Mg Tabs (Valsartan-hydrochlorothiazide) .Marland Kitchen... Take 1 tablet by mouth once a day  Problem # 2:  EDEMA, EYELID (ICD-374.82) Unclear cause..? secondary to sinus infection given resolution with broader antibitoics...although also took prednisone taper at same time.   Complete Medication List: 1)  Naprosyn 500 Mg Tabs (Naproxen) .... If needed one tablet 2 times daily 2)  Astepro 0.15 % Soln (Azelastine hcl) .... One spray in the  pm 3)  Gr8- Dophilus  .... Daily 4)  Cholest Off 450 Mg Tabs (Plant sterols and stanols) .... Daily 5)  Colon Care  .... Daily 6)  Quercetin 50 Mg Tabs (Quercetin) .... Sometimes 7)  Serrapeptase  8)  Bee Pollen 580 Mg Caps (Bee pollen) .... Daily 9)  Oil of Oregano 1500 Mg Caps (Oregano) .... Daily 10)  Multivitamins Caps (Multiple vitamin) .... Daily 11)  Bone Up  .... Daily 12)  Osteo Bi-flex Adv Double St Tabs (Misc natural products) .... Daily 13)  Cissus  14)  Ginger Root 500 Mg Caps (Ginger (zingiber officinalis)) .... Daily 15)  Urinary Tract Health 365-50 Mg Caps (Cranberry-olive leaf) .... Daily in am 16)  Milk Thistle 300 Mg Caps (Milk thistle) .... Daily 17)  Lutein 8 Mg Caps (Lutein) .... Daily for eyes 18)  Evening Primrose Oil 500 Mg Caps (Evening primrose oil) .... 1000mg  nightly 19)  Ashwagandha  20)  Black Cohosh 540 Mg Caps (Black cohosh) .... Once daily 21)  Red Wine Extract Plus Caps (Misc natural products) .... 200mg  daily 22)  Super-c 1000 Tabs (Bioflavonoid products) .... 2000 mg daily 23)  Magnesium Oxide 250 Mg Tabs (Magnesium oxide) .... Daily 24)  Ayr 0.65 % Soln (Saline) .... Sometime 25)  Saline Allergy With Butterbur Extract  .... Sometimes 26)  Now Activated Nasal With Erythritol and Xylitol and Natural  .... Sometimes 27)  Diovan Hct 160-12.5 Mg Tabs (Valsartan-hydrochlorothiazide) .... Take 1 tablet by mouth once a day  Patient Instructions: 1)  MAke sure next April appt scheduled for CPX.  OV.  2)  Follow BPs at home.  3)  Goal <140/90.  4)  Stop amlodipine. 5)  Restart diovan. 6)  Call immediately if any facial swelling and stop diovan.   Current Allergies (reviewed today): ! CIPRO ! DYAZIDE ! SULFA ! DIOVAN

## 2010-05-31 ENCOUNTER — Encounter: Payer: Self-pay | Admitting: Family Medicine

## 2010-07-11 ENCOUNTER — Telehealth: Payer: Self-pay | Admitting: Family Medicine

## 2010-07-11 DIAGNOSIS — E782 Mixed hyperlipidemia: Secondary | ICD-10-CM

## 2010-07-11 DIAGNOSIS — I10 Essential (primary) hypertension: Secondary | ICD-10-CM

## 2010-07-11 NOTE — Telephone Encounter (Signed)
Message copied by Kerby Nora on Wed Jul 11, 2010  6:49 PM ------      Message from: Melody Comas      Created: Wed Jul 11, 2010 11:29 AM      Regarding: CPX labs for Monday        Patient having cpx labs, please order future labs.

## 2010-07-16 ENCOUNTER — Other Ambulatory Visit (INDEPENDENT_AMBULATORY_CARE_PROVIDER_SITE_OTHER): Payer: Medicare PPO | Admitting: Family Medicine

## 2010-07-16 DIAGNOSIS — I10 Essential (primary) hypertension: Secondary | ICD-10-CM

## 2010-07-16 DIAGNOSIS — E782 Mixed hyperlipidemia: Secondary | ICD-10-CM

## 2010-07-16 LAB — COMPREHENSIVE METABOLIC PANEL
ALT: 18 U/L (ref 0–35)
AST: 23 U/L (ref 0–37)
Creatinine, Ser: 0.7 mg/dL (ref 0.4–1.2)
GFR: 88.5 mL/min (ref >60.00)
Total Bilirubin: 0.5 mg/dL (ref 0.3–1.2)

## 2010-07-16 LAB — LIPID PANEL
HDL: 70.6 mg/dL (ref >39.00)
Total CHOL/HDL Ratio: 4
VLDL: 24 mg/dL (ref 0.0–40.0)

## 2010-07-20 ENCOUNTER — Encounter: Payer: Self-pay | Admitting: Family Medicine

## 2010-07-20 ENCOUNTER — Ambulatory Visit (INDEPENDENT_AMBULATORY_CARE_PROVIDER_SITE_OTHER): Payer: Medicare PPO | Admitting: Family Medicine

## 2010-07-20 DIAGNOSIS — Z1212 Encounter for screening for malignant neoplasm of rectum: Secondary | ICD-10-CM

## 2010-07-20 DIAGNOSIS — I10 Essential (primary) hypertension: Secondary | ICD-10-CM

## 2010-07-20 DIAGNOSIS — E782 Mixed hyperlipidemia: Secondary | ICD-10-CM

## 2010-07-20 DIAGNOSIS — Z0000 Encounter for general adult medical examination without abnormal findings: Secondary | ICD-10-CM

## 2010-07-20 DIAGNOSIS — R002 Palpitations: Secondary | ICD-10-CM

## 2010-07-20 MED ORDER — AMLODIPINE BESYLATE 2.5 MG PO TABS: 2.5000 mg | ORAL_TABLET | Freq: Every day | ORAL | Status: DC

## 2010-07-20 NOTE — Patient Instructions (Addendum)
Start fish oil 2400 mg daily for cholesterol.  Call if interested in pneumonia vaccine and shingles vaccine. Also let us know if interested in mammogram before next year...as well as bone density. Return stool cards. Start low dose amlodipine 2.5 mg daily... Follow BPs.. Call if remain >140/90 in 1 week or if SE on this medication.

## 2010-07-20 NOTE — Assessment & Plan Note (Addendum)
Poor control cholesterol. Start fish oil 2400 mg daily for cholesterol.

## 2010-07-20 NOTE — Assessment & Plan Note (Addendum)
EKG showed no specific changes.. Pt without CP or exertional fatigue, SOB. Restarting calcium channel blocker should help with this issue.

## 2010-07-20 NOTE — Assessment & Plan Note (Addendum)
Poor control today in office. On no medication for this issue.  In the past she has had ? Angioedema to ACEI, ARB.... ? Reaction to HCTZ given sulfa allergy.  Also on metoprolol made her feel "BAD' and amlodipine 10 mg cause peripheral swelling.  We will try lower dose amlodipine to see if better BP control without peripheral swelling.  Start low dose amlodipine 2.5 mg daily... Follow BPs.. Call if remain >140/90 in 1 week or if SE on this medication.

## 2010-07-20 NOTE — Progress Notes (Signed)
Subjective:    Patient ID: Karen Bowman, female    DOB: 1940/11/16, 70 y.o.   MRN: [id number]  HPI I have personally reviewed the Medicare Annual Wellness questionnaire and have noted 1. The patient's medical and social history 2. Their use of alcohol, tobacco or illicit drugs 3. Their current medications and supplements 4. The patient's functional ability including ADL's, fall risks, home safety risks and hearing or visual             impairment. 5. Diet and physical activities 6. Evidence for depression or mood disorders The patients weight, height, BMI and visual acuity have been recorded in the chart I have made referrals, counseling and provided education to the patient based review of the above and I have provided the pt with a written personalized care plan for preventive services.   Rheumatoid arthritis: Seeing Dr. Gavin Potters once a year. Maintained with naprosyn. DEXA done in 2010.Marland Kitchen Not interested in repeating yet.    HTN .Marland Kitchen Poor control... Pt very hesitant about medicaion SE. HAs bad experience in past. Has not been checking BP at home. Occ having palpitations ...resolves if laying still. Some anxiety associated with it. No chest pain, no SOB. Did not sleep well last night.  Also having some left sided buttock pain... Occurred in past. Cramps in left side. No numbness, no weakness.    High cholesterol...poor control. Red yeast rice helped in past... Dropped it 30-40 points.  Open to restarting this.  She eats very low fat diet, getting regular exercsie.    Review of Systems  Constitutional: Negative for fever and fatigue.  HENT: Negative for ear pain.   Eyes: Negative for pain.  Respiratory: Negative for chest tightness and shortness of breath.   Cardiovascular: Negative for chest pain and leg swelling.  Gastrointestinal: Negative for abdominal pain.  Genitourinary: Negative for dysuria.  Musculoskeletal: Positive for back pain. Negative for joint swelling  and gait problem.       Objective:   Physical Exam  Constitutional: Vital signs are normal. She appears well-developed and well-nourished. She is cooperative.  Non-toxic appearance. She does not appear ill. No distress.  HENT:  Head: Normocephalic.  Right Ear: Hearing, tympanic membrane, external ear and ear canal normal.  Left Ear: Hearing, tympanic membrane, external ear and ear canal normal.  Nose: Nose normal.  Eyes: Conjunctivae, EOM and lids are normal. Pupils are equal, round, and reactive to light. No foreign bodies found.  Neck: Trachea normal and normal range of motion. Neck supple. Carotid bruit is not present. No mass and no thyromegaly present.  Cardiovascular: Normal rate, regular rhythm, S1 normal, S2 normal, normal heart sounds and intact distal pulses.  Exam reveals no gallop.   No murmur heard. Pulmonary/Chest: Effort normal and breath sounds normal. No respiratory distress. She has no wheezes. She has no rhonchi. She has no rales.  Abdominal: Soft. Normal appearance and bowel sounds are normal. She exhibits no distension, no fluid wave, no abdominal bruit and no mass. There is no hepatosplenomegaly. There is no tenderness. There is no rebound, no guarding and no CVA tenderness. No hernia.  Genitourinary: Vagina normal and uterus normal. No breast swelling, tenderness, discharge or bleeding. Pelvic exam was performed with patient prone. There is no rash, tenderness or lesion on the right labia. There is no rash, tenderness or lesion on the left labia. Uterus is not enlarged and not tender. Cervix exhibits no motion tenderness, no discharge and no friability. Right adnexum displays no mass, no  tenderness and no fullness. Left adnexum displays no mass, no tenderness and no fullness.  Lymphadenopathy:    She has no cervical adenopathy.    She has no axillary adenopathy.  Neurological: She is alert. She has normal strength. No cranial nerve deficit or sensory deficit.  Skin: Skin  is warm, dry and intact. No rash noted.  Psychiatric: Her speech is normal and behavior is normal. Judgment normal. Her mood appears not anxious. Cognition and memory are normal. She does not exhibit a depressed mood.          Assessment & Plan:  Complete Physical Exam: The patient's preventative maintenance and recommended screening tests for an annual wellness exam were reviewed in full today. Brought up to date unless services declined.  Counselled on the importance of diet, exercise, and its role in overall health and mortality. The patient's FH and SH was reviewed, including their home life, tobacco status, and drug and alcohol status.   No indication for PAP or DVE.Marland Kitchen Has had TAH.  Declines most prevention.. Wishes to wait until feeling better.

## 2010-07-20 NOTE — Progress Notes (Deleted)
I have personally reviewed the Medicare Annual Wellness questionnaire and have noted 1. The patient's medical and social history 2. Their use of alcohol, tobacco or illicit drugs 3. Their current medications and supplements 4. The patient's functional ability including ADL's, fall risks, home safety risks and hearing or visual             impairment. 5. Diet and physical activities 6. Evidence for depression or mood disorders The patients weight, height, BMI and visual acuity have been recorded in the chart I have made referrals, counseling and provided education to the patient based review of the above and I have provided the pt with a written personalized care plan for preventive services.

## 2010-07-30 NOTE — Progress Notes (Signed)
  Subjective:    Patient ID: Karen Bowman, female    DOB: 1941-03-20, 70 y.o.   MRN: [id number]  HPI    Review of Systems     Objective:   Physical Exam  Correction to physical exam... No GYN exam performed, no pap. Please eliminate this from records. No change for pelvic exam. Breast exam was performed.        Assessment & Plan:

## 2010-08-02 NOTE — Progress Notes (Signed)
Addended byMills Koller on: 08/02/2010 11:31 AM   Modules accepted: Orders

## 2010-08-03 ENCOUNTER — Other Ambulatory Visit: Payer: Medicare PPO

## 2010-08-03 ENCOUNTER — Other Ambulatory Visit (INDEPENDENT_AMBULATORY_CARE_PROVIDER_SITE_OTHER): Payer: Medicare PPO | Admitting: Family Medicine

## 2010-08-03 DIAGNOSIS — Z1212 Encounter for screening for malignant neoplasm of rectum: Secondary | ICD-10-CM

## 2010-10-16 ENCOUNTER — Other Ambulatory Visit (INDEPENDENT_AMBULATORY_CARE_PROVIDER_SITE_OTHER): Payer: Medicare PPO | Admitting: Family Medicine

## 2010-10-16 DIAGNOSIS — E782 Mixed hyperlipidemia: Secondary | ICD-10-CM

## 2010-10-16 LAB — LIPID PANEL
Cholesterol: 247 mg/dL — ABNORMAL HIGH (ref 0–200)
Total CHOL/HDL Ratio: 4
Triglycerides: 97 mg/dL (ref 0.0–149.0)
VLDL: 19.4 mg/dL (ref 0.0–40.0)

## 2010-10-18 ENCOUNTER — Telehealth: Payer: Self-pay | Admitting: Family Medicine

## 2010-10-18 DIAGNOSIS — E7800 Pure hypercholesterolemia, unspecified: Secondary | ICD-10-CM

## 2010-10-18 NOTE — Telephone Encounter (Signed)
Patient notified. She says that she is going to think about restarting the red yeast rice and will call back. She will call back to schedule labs.

## 2010-10-18 NOTE — Telephone Encounter (Signed)
Notify pt that LDL chol is higher than last check despite lifestyle changes and fish oil. I would recommend restarting red yeast rice and recheck in 3 months.  Add to med list if pt agreeable, if not let me know.

## 2011-04-09 ENCOUNTER — Encounter: Payer: Self-pay | Admitting: Family Medicine

## 2011-04-09 ENCOUNTER — Other Ambulatory Visit: Payer: Self-pay | Admitting: *Deleted

## 2011-04-09 DIAGNOSIS — K589 Irritable bowel syndrome without diarrhea: Secondary | ICD-10-CM | POA: Insufficient documentation

## 2011-04-09 MED ORDER — NAPROXEN 500 MG PO TABS: 500.0000 mg | ORAL_TABLET | Freq: Two times a day (BID) | ORAL | Status: DC | PRN

## 2011-04-09 MED ORDER — AMLODIPINE BESYLATE 2.5 MG PO TABS: 2.5000 mg | ORAL_TABLET | Freq: Every day | ORAL | Status: DC

## 2011-08-02 ENCOUNTER — Other Ambulatory Visit: Payer: Self-pay

## 2011-08-02 MED ORDER — AMLODIPINE BESYLATE 2.5 MG PO TABS: 2.5000 mg | ORAL_TABLET | Freq: Every day | ORAL | Status: DC

## 2011-08-02 MED ORDER — NAPROXEN 500 MG PO TABS: 500.0000 mg | ORAL_TABLET | Freq: Two times a day (BID) | ORAL | Status: DC | PRN

## 2011-08-02 NOTE — Telephone Encounter (Signed)
Okay to fill as requested

## 2011-08-02 NOTE — Telephone Encounter (Signed)
Pt left note at front desk requesting 3 month supply with refills for Naproxen 500 mg taking twice a day and Amlodipine 2.5 mg taking one daily sent to [facility] fax [phone]. Pt also request 30 day supply while waiting on mail order for amlodipine 2.5 mg sent to [facility]. Pt last seen 07/20/10. No future appt scheduled.Please advise. Pt can be reached [phone].

## 2011-10-16 ENCOUNTER — Ambulatory Visit: Payer: Self-pay | Admitting: Ophthalmology

## 2011-12-11 ENCOUNTER — Ambulatory Visit: Payer: Self-pay | Admitting: Ophthalmology

## 2012-08-26 ENCOUNTER — Other Ambulatory Visit: Payer: Self-pay | Admitting: *Deleted

## 2012-08-26 MED ORDER — AMLODIPINE BESYLATE 2.5 MG PO TABS: 2.5000 mg | ORAL_TABLET | Freq: Every day | ORAL | Status: DC

## 2012-08-26 MED ORDER — NAPROXEN 500 MG PO TABS: 500.0000 mg | ORAL_TABLET | Freq: Two times a day (BID) | ORAL | Status: DC | PRN

## 2012-08-26 NOTE — Telephone Encounter (Signed)
Received refill request from [facility]. I called pt and left message for her to return call to office. She has not been seen since 4/12.

## 2012-09-01 ENCOUNTER — Other Ambulatory Visit (INDEPENDENT_AMBULATORY_CARE_PROVIDER_SITE_OTHER): Payer: Medicare PPO

## 2012-09-01 ENCOUNTER — Telehealth: Payer: Self-pay | Admitting: Family Medicine

## 2012-09-01 DIAGNOSIS — E782 Mixed hyperlipidemia: Secondary | ICD-10-CM

## 2012-09-01 LAB — LIPID PANEL
Cholesterol: 264 mg/dL — ABNORMAL HIGH (ref 0–200)
HDL: 57.9 mg/dL (ref >39.00)
VLDL: 23 mg/dL (ref 0.0–40.0)

## 2012-09-01 LAB — COMPREHENSIVE METABOLIC PANEL
BUN: 15 mg/dL (ref 6–23)
CO2: 27 mEq/L (ref 19–32)
Creatinine, Ser: 0.7 mg/dL (ref 0.4–1.2)
GFR: 85.14 mL/min (ref >60.00)
Glucose, Bld: 93 mg/dL (ref 70–99)
Total Bilirubin: 0.6 mg/dL (ref 0.3–1.2)

## 2012-09-01 NOTE — Telephone Encounter (Signed)
Message copied by Excell Seltzer on Tue Sep 01, 2012  8:29 AM ------      Message from: Alvina Chou      Created: Thu Aug 27, 2012 11:07 AM      Regarding: lab orders for Tuesday, 6.3.14       Patient is scheduled for CPX labs, please order future labs, Thanks , Terri       ------

## 2012-09-04 ENCOUNTER — Ambulatory Visit (INDEPENDENT_AMBULATORY_CARE_PROVIDER_SITE_OTHER): Payer: Medicare PPO | Admitting: Family Medicine

## 2012-09-04 ENCOUNTER — Encounter: Payer: Self-pay | Admitting: Family Medicine

## 2012-09-04 VITALS — BP 120/84 | HR 86 | Temp 98.3°F | Ht 65.5 in | Wt 138.0 lb

## 2012-09-04 DIAGNOSIS — Z1212 Encounter for screening for malignant neoplasm of rectum: Secondary | ICD-10-CM

## 2012-09-04 DIAGNOSIS — E782 Mixed hyperlipidemia: Secondary | ICD-10-CM

## 2012-09-04 DIAGNOSIS — Z0000 Encounter for general adult medical examination without abnormal findings: Secondary | ICD-10-CM

## 2012-09-04 DIAGNOSIS — I10 Essential (primary) hypertension: Secondary | ICD-10-CM

## 2012-09-04 MED ORDER — AMLODIPINE BESYLATE 2.5 MG PO TABS: 2.5000 mg | ORAL_TABLET | Freq: Every day | ORAL | Status: DC

## 2012-09-04 MED ORDER — BETAMETHASONE DIPROPIONATE 0.05 % EX CREA: TOPICAL_CREAM | Freq: Two times a day (BID) | CUTANEOUS | Status: DC

## 2012-09-04 NOTE — Progress Notes (Signed)
HPI  I have personally reviewed the Medicare Annual Wellness questionnaire and have noted  1. The patient's medical and social history  2. Their use of alcohol, tobacco or illicit drugs  3. Their current medications and supplements  4. The patient's functional ability including ADL's, fall risks, home safety risks and hearing or visual  impairment.  5. Diet and physical activities  6. Evidence for depression or mood disorders  The patients weight, height, BMI and visual acuity have been recorded in the chart  I have made referrals, counseling and provided education to the patient based review of the above and I have provided the pt with a written personalized care plan for preventive services.   Rheumatoid arthritis: tolerable, not interesteds in meds other than natural meds for this. Seeing Dr. Gavin Potters in past, not routinely. Maintained with naprosyn.   HTN Well controlled on amlodipine. Using medication without problems or lightheadedness: None Chest pain with exertion:None Edema:None Short of breath:None Average home BPs: well controlled. Other issues:.  No numbness, no weakness.   High cholesterol...poor control.  Using coQ 10. Squid oil. Lab Results  Component Value Date   CHOL 264* 09/01/2012   HDL 57.90 09/01/2012   LDLCALC 139 07/18/2009   LDLDIRECT 182.4 09/01/2012   TRIG 115.0 09/01/2012   CHOLHDL 5 09/01/2012  She eats very low fat diet, getting regular exercsie.   She has not tolerated red yeast rice well.  History   Social History  . Marital Status: Married    Spouse Name: N/A    Number of Children: 2  . Years of Education: N/A   Occupational History  . retired Dipl[employer] Services operational officer    Social History Main Topics  . Smoking status: Never Smoker   . Smokeless tobacco: Never Used  . Alcohol Use: None  . Drug Use: None  . Sexually Active: None   Other Topics Concern  . None   Social History Narrative   Exercise:walking 2-3 times a week   End of life issues: Has living  will.. Husband is HCPOA. Full code (reviewed 2014)    Review of Systems  Constitutional: Negative for fever and fatigue.  HENT: Negative for ear pain.  Eyes: Negative for pain.  Respiratory: Negative for chest tightness and shortness of breath.  Cardiovascular: Negative for chest pain and leg swelling.  Gastrointestinal: Negative for abdominal pain.  Genitourinary: Negative for dysuria.  Musculoskeletal: Positive for back pain. Negative for joint swelling and gait problem.  Objective:   Physical Exam  Constitutional: Vital signs are normal. She appears well-developed and well-nourished. She is cooperative. Non-toxic appearance. She does not appear ill. No distress.  HENT:  Head: Normocephalic.  Right Ear: Hearing, tympanic membrane, external ear and ear canal normal.  Left Ear: Hearing, tympanic membrane, external ear and ear canal normal.  Nose: Nose normal.  Eyes: Conjunctivae, EOM and lids are normal. Pupils are equal, round, and reactive to light. No foreign bodies found.  Neck: Trachea normal and normal range of motion. Neck supple. Carotid bruit is not present. No mass and no thyromegaly present.  Cardiovascular: Normal rate, regular rhythm, S1 normal, S2 normal, normal heart sounds and intact distal pulses. Exam reveals no gallop.  No murmur heard.  Pulmonary/Chest: Effort normal and breath sounds normal. No respiratory distress. She has no wheezes. She has no rhonchi. She has no rales.  Abdominal: Soft. Normal appearance and bowel sounds are normal. She exhibits no distension, no fluid wave, no abdominal bruit and no mass. There is no  hepatosplenomegaly. There is no tenderness. There is no rebound, no guarding and no CVA tenderness. No hernia.  Genitourinary: Vagina normal and uterus normal. No breast swelling, tenderness, discharge or bleeding. Pelvic exam was performed with patient prone. There is no rash, tenderness or lesion on the right labia. There is no rash, tenderness or  lesion on the left labia. Uterus is not enlarged and not tender. Cervix exhibits no motion tenderness, no discharge and no friability. Right adnexum displays no mass, no tenderness and no fullness. Left adnexum displays no mass, no tenderness and no fullness.  Lymphadenopathy:  She has no cervical adenopathy.  She has no axillary adenopathy.  Neurological: She is alert. She has normal strength. No cranial nerve deficit or sensory deficit.  Skin: Skin is warm, dry and intact. No rash noted.  Psychiatric: Her speech is normal and behavior is normal. Judgment normal. Her mood appears not anxious. Cognition and memory are normal. She does not exhibit a depressed mood.  Assessment & Plan:   Complete Physical Exam:  The patient's preventative maintenance and recommended screening tests for an annual wellness exam were reviewed in full today.  Brought up to date unless services declined.  Counselled on the importance of diet, exercise, and its role in overall health and mortality.  The patient's FH and SH was reviewed, including their home life, tobacco status, and drug and alcohol status.    DEXA done in 2010.Marland Kitchen Not interested in repeating yet.  Vaccines:Due for PNA and shingles Due for mammogram DVE/pap: not indicated, s/p hysterectomy Colon:ifob in 2012 neg, plan to repeat

## 2012-09-04 NOTE — Assessment & Plan Note (Addendum)
Poor control. She refuses statin and had SE to red yeast rice. She will look into welchol as well as natural treatments. Increase exercire and fiber.

## 2012-09-04 NOTE — Patient Instructions (Addendum)
Consider welchol for cholesterol control.  Increase exercise. Increase fiber in diet, can try plant sterols for cholesterol control.  Change to 2% cheese or part skim milk cheese.  Stop creamer change to skim milk.  Change to egg white. Schedule bone density and mammogram on your own.  Stop at lab to pick up stool test.  Schedule fasting labs in 6 months.

## 2012-09-04 NOTE — Assessment & Plan Note (Signed)
Well controlled. Continue current medication.  

## 2012-11-10 ENCOUNTER — Other Ambulatory Visit: Payer: Self-pay | Admitting: Ophthalmology

## 2012-11-10 LAB — ALBUMIN: Albumin: 4.1 g/dL (ref 3.4–5.0)

## 2013-01-04 ENCOUNTER — Other Ambulatory Visit: Payer: Self-pay | Admitting: *Deleted

## 2013-01-04 NOTE — Telephone Encounter (Signed)
Last office visit 09/04/2012.  Ok to refill? 

## 2013-01-05 ENCOUNTER — Other Ambulatory Visit: Payer: Self-pay | Admitting: *Deleted

## 2013-01-05 MED ORDER — NAPROXEN 500 MG PO TABS: 500.0000 mg | ORAL_TABLET | Freq: Two times a day (BID) | ORAL | Status: DC | PRN

## 2013-01-07 NOTE — Telephone Encounter (Signed)
Pt left v/m requesting status of naprosyn refill; called back and spoke with pts husband and advised refilled on 01/04/13; pt to ck with [facility],

## 2013-03-08 ENCOUNTER — Other Ambulatory Visit (INDEPENDENT_AMBULATORY_CARE_PROVIDER_SITE_OTHER): Payer: Medicare PPO

## 2013-03-08 DIAGNOSIS — E7800 Pure hypercholesterolemia, unspecified: Secondary | ICD-10-CM

## 2013-03-08 LAB — LDL CHOLESTEROL, DIRECT: Direct LDL: 206 mg/dL

## 2013-03-08 LAB — LIPID PANEL
Total CHOL/HDL Ratio: 5
Triglycerides: 93 mg/dL (ref 0.0–149.0)

## 2013-03-08 NOTE — Addendum Note (Signed)
Addended by: Alvina Chou on: 03/08/2013 12:18 PM   Modules accepted: Orders

## 2013-03-09 NOTE — Progress Notes (Signed)
Noted  

## 2013-06-22 ENCOUNTER — Other Ambulatory Visit: Payer: Self-pay

## 2013-06-22 MED ORDER — NAPROXEN 500 MG PO TABS: 500.0000 mg | ORAL_TABLET | Freq: Two times a day (BID) | ORAL | Status: DC | PRN

## 2013-06-22 NOTE — Telephone Encounter (Signed)
Pt left v/m requesting refill naproxen to [facility]. Pt request cb when refilled.Please advise.

## 2013-06-23 NOTE — Telephone Encounter (Signed)
Left message for Karen Bowman that refill request has been sent in to [facility].

## 2013-11-24 ENCOUNTER — Other Ambulatory Visit: Payer: Self-pay | Admitting: *Deleted

## 2013-11-24 MED ORDER — NAPROXEN 500 MG PO TABS: 500.0000 mg | ORAL_TABLET | Freq: Two times a day (BID) | ORAL | Status: DC | PRN

## 2013-11-24 MED ORDER — AMLODIPINE BESYLATE 2.5 MG PO TABS: 2.5000 mg | ORAL_TABLET | Freq: Every day | ORAL | Status: DC

## 2013-11-25 ENCOUNTER — Other Ambulatory Visit: Payer: Self-pay | Admitting: Family Medicine

## 2013-11-26 MED ORDER — NAPROXEN 500 MG PO TABS: 500.0000 mg | ORAL_TABLET | Freq: Two times a day (BID) | ORAL | Status: DC | PRN

## 2013-11-26 MED ORDER — NAPROXEN 500 MG PO TABS
500.0000 mg | ORAL_TABLET | Freq: Two times a day (BID) | ORAL | Status: DC | PRN
Start: 2013-11-26 — End: 2014-02-01

## 2013-11-26 NOTE — Addendum Note (Signed)
Addended by: Carter Kitten on: 11/26/2013 10:02 AM   Modules accepted: Orders

## 2013-11-26 NOTE — Addendum Note (Signed)
Addended by: Carter Kitten on: 11/26/2013 09:10 AM   Modules accepted: Orders

## 2014-01-26 ENCOUNTER — Telehealth: Payer: Self-pay | Admitting: Family Medicine

## 2014-01-26 DIAGNOSIS — E782 Mixed hyperlipidemia: Secondary | ICD-10-CM

## 2014-01-26 NOTE — Telephone Encounter (Signed)
Message copied by Jinny Sanders on Wed Jan 26, 2014 10:43 PM ------      Message from: Ellamae Sia      Created: Wed Jan 19, 2014  5:40 PM      Regarding: Lab orders for Thursday,10.29.15       Patient is scheduled for CPX labs, please order future labs, Thanks , Terri       ------

## 2014-01-27 ENCOUNTER — Other Ambulatory Visit (INDEPENDENT_AMBULATORY_CARE_PROVIDER_SITE_OTHER): Payer: Medicare PPO

## 2014-01-27 DIAGNOSIS — E782 Mixed hyperlipidemia: Secondary | ICD-10-CM

## 2014-01-27 LAB — COMPREHENSIVE METABOLIC PANEL
ALBUMIN: 3.8 g/dL (ref 3.5–5.2)
ALK PHOS: 72 U/L (ref 39–117)
ALT: 18 U/L (ref 0–35)
AST: 25 U/L (ref 0–37)
BILIRUBIN TOTAL: 0.8 mg/dL (ref 0.2–1.2)
BUN: 19 mg/dL (ref 6–23)
CO2: 27 mEq/L (ref 19–32)
Calcium: 9.5 mg/dL (ref 8.4–10.5)
Chloride: 105 mEq/L (ref 96–112)
Creatinine, Ser: 0.8 mg/dL (ref 0.4–1.2)
GFR: 75.09 mL/min (ref >60.00)
Glucose, Bld: 94 mg/dL (ref 70–99)
Potassium: 4 mEq/L (ref 3.5–5.1)
Sodium: 138 mEq/L (ref 135–145)
Total Protein: 7.2 g/dL (ref 6.0–8.3)

## 2014-01-27 LAB — LIPID PANEL
CHOLESTEROL: 286 mg/dL — AB (ref 0–200)
HDL: 60.3 mg/dL (ref >39.00)
LDL CALC: 206 mg/dL — AB (ref 0–99)
NonHDL: 225.7
TRIGLYCERIDES: 99 mg/dL (ref 0.0–149.0)
Total CHOL/HDL Ratio: 5
VLDL: 19.8 mg/dL (ref 0.0–40.0)

## 2014-02-01 ENCOUNTER — Ambulatory Visit (INDEPENDENT_AMBULATORY_CARE_PROVIDER_SITE_OTHER): Payer: Medicare PPO | Admitting: Family Medicine

## 2014-02-01 ENCOUNTER — Encounter: Payer: Self-pay | Admitting: Family Medicine

## 2014-02-01 VITALS — BP 182/93 | HR 78 | Temp 98.2°F | Ht 65.0 in | Wt 139.2 lb

## 2014-02-01 DIAGNOSIS — Z1211 Encounter for screening for malignant neoplasm of colon: Secondary | ICD-10-CM

## 2014-02-01 DIAGNOSIS — Z0000 Encounter for general adult medical examination without abnormal findings: Secondary | ICD-10-CM

## 2014-02-01 DIAGNOSIS — I10 Essential (primary) hypertension: Secondary | ICD-10-CM

## 2014-02-01 DIAGNOSIS — E782 Mixed hyperlipidemia: Secondary | ICD-10-CM

## 2014-02-01 DIAGNOSIS — Z515 Encounter for palliative care: Secondary | ICD-10-CM | POA: Insufficient documentation

## 2014-02-01 MED ORDER — NAPROXEN 500 MG PO TABS: 500.0000 mg | ORAL_TABLET | Freq: Two times a day (BID) | ORAL | Status: DC | PRN

## 2014-02-01 MED ORDER — AMLODIPINE BESYLATE 2.5 MG PO TABS: ORAL_TABLET | ORAL | Status: DC

## 2014-02-01 MED ORDER — BETAMETHASONE DIPROPIONATE 0.05 % EX CREA: TOPICAL_CREAM | Freq: Two times a day (BID) | CUTANEOUS | Status: DC

## 2014-02-01 NOTE — Assessment & Plan Note (Signed)
Likely elevated due to recent anxiety. Follow at home. Increase med if continuing to be high at follow up.

## 2014-02-01 NOTE — Addendum Note (Signed)
Addended by: Carter Kitten on: 02/01/2014 03:44 PM   Modules accepted: Orders

## 2014-02-01 NOTE — Progress Notes (Signed)
HPI  I have personally reviewed the Medicare Annual Wellness questionnaire and have noted  1. The patient's medical and social history  2. Their use of alcohol, tobacco or illicit drugs  3. Their current medications and supplements  4. The patient's functional ability including ADL's, fall risks, home safety risks and hearing or visual  impairment.  5. Diet and physical activities  6. Evidence for depression or mood disorders  The patients weight, height, BMI and visual acuity have been recorded in the chart  I have made referrals, counseling and provided education to the patient based review of the above and I have provided the pt with a written personalized care plan for preventive services.   Rheumatoid arthritis: tolerable, not interesteds in meds other than natural meds for this. Seeing Dr. Jefm Bryant in past, not routinely. Maintained with naprosyn.  Brother in law died 2 weeks ago. Husband with new heart issues.  Increase in stress. She has been anxious.  No depression.  HTN: previously welll controlled on amlodipine. She feels that her BP is up due to her feeling anxious. BP Readings from Last 3 Encounters:  02/01/14 182/93  09/04/12 120/84  07/20/10 160/90  Using medication without problems or lightheadedness: None Chest pain with exertion:None Edema:None Short of breath:None Average home BPs: not checking at home. Other issues:.    High cholesterol...poor control. Using coQ 10. Squid oil. Lab Results  Component Value Date   CHOL 286* 01/27/2014   HDL 60.30 01/27/2014   LDLCALC 206* 01/27/2014   LDLDIRECT 206.0 03/08/2013   TRIG 99.0 01/27/2014   CHOLHDL 5 01/27/2014  She eats very low fat diet, getting regular exercsie.  She has not tolerated red yeast rice well. Not interested in statin meds.    History   Social History  . Marital Status: Married    Spouse Name: N/A    Number of Children: 2  . Years of Education: N/A   Occupational History   . retired Network engineer    Social History Main Topics  . Smoking status: Never Smoker   . Smokeless tobacco: Never Used  . Alcohol Use: None  . Drug Use: None  . Sexual Activity: None   Other Topics Concern  . None   Social History Narrative   Exercise:walking 2-3 times a week   End of life issues: Has living will.. Husband is HCPOA. Full code (reviewed 2014)   Review of Systems  Constitutional: Negative for fever and fatigue.  HENT: Negative for ear pain.  Eyes: Negative for pain.  Respiratory: Negative for chest tightness and shortness of breath.  Cardiovascular: Negative for chest pain and leg swelling.  Gastrointestinal: Negative for abdominal pain.  Genitourinary: Negative for dysuria.  Musculoskeletal: Positive for back pain. Negative for joint swelling and gait problem.  Objective:   Physical Exam  Constitutional: Vital signs are normal. She appears well-developed and well-nourished. She is cooperative. Non-toxic appearance. She does not appear ill. No distress.  HENT:  Head: Normocephalic.  Right Ear: Hearing, tympanic membrane, external ear and ear canal normal.  Left Ear: Hearing, tympanic membrane, external ear and ear canal normal.  Nose: Nose normal.  Eyes: Conjunctivae, EOM and lids are normal. Pupils are equal, round, and reactive to light. No foreign bodies found.  Neck: Trachea normal and normal range of motion. Neck supple. Carotid bruit is not present. No mass and no thyromegaly present.  Cardiovascular: Normal rate, regular rhythm, S1 normal, S2 normal, normal heart sounds and intact distal pulses. Exam reveals no  gallop.  No murmur heard.  Pulmonary/Chest: Effort normal and breath sounds normal. No respiratory distress. She has no wheezes. She has no rhonchi. She has no rales.  Abdominal: Soft. Normal appearance and bowel sounds are normal. She exhibits no distension, no fluid wave, no abdominal bruit and no mass. There is no  hepatosplenomegaly. There is no tenderness. There is no rebound, no guarding and no CVA tenderness. No hernia.  Genitourinary: not indicated Breast exam: no breast masses or nipple changes Bilaterally. Lymphadenopathy:  She has no cervical adenopathy.  She has no axillary adenopathy.  Neurological: She is alert. She has normal strength. No cranial nerve deficit or sensory deficit.  Skin: Skin is warm, dry and intact. No rash noted.  Psychiatric: Her speech is normal and behavior is normal. Judgment normal. Her mood appears not anxious. Cognition and memory are normal. She does not exhibit a depressed mood.  Assessment & Plan:   Complete Physical Exam:  The patient's preventative maintenance and recommended screening tests for an annual wellness exam were reviewed in full today.  Brought up to date unless services declined.  Counselled on the importance of diet, exercise, and its role in overall health and mortality.  The patient's FH and SH was reviewed, including their home life, tobacco status, and drug and alcohol status.    DEXA done in 2010.Marland Kitchen Not interested in repeating yet.  Vaccines:Due for PNA and shingles. SE to flu vaccine in past.  She is not interested. Due for mammogram,never went last year, not interested in repeating. DVE/pap: not indicated, s/p hysterectomy Colon:never sent back ifob 2014, wants to do this year

## 2014-02-01 NOTE — Patient Instructions (Addendum)
Work on stress reduction, relaxation.  Follow BP at home , call if consistently > 140/90  Follow up in 1 month for BP check.  Look into welchol for high cholesterol.  Continue working on low cholesterol diet.  Continue exercise as able.  Stop at lab on way [address] to pick up stool test.

## 2014-02-01 NOTE — Assessment & Plan Note (Signed)
Pt refuses statin. Intolerant of red yeast rice. She will consider welchol.

## 2014-02-01 NOTE — Addendum Note (Signed)
Addended by: Carter Kitten on: 02/01/2014 05:09 PM   Modules accepted: Orders

## 2014-02-01 NOTE — Progress Notes (Signed)
Pre visit review using our clinic review tool, if applicable. No additional management support is needed unless otherwise documented below in the visit note. 

## 2014-02-02 ENCOUNTER — Telehealth: Payer: Self-pay | Admitting: Family Medicine

## 2014-02-02 NOTE — Addendum Note (Signed)
Addended by: Carter Kitten on: 02/02/2014 05:21 PM   Modules accepted: Medications

## 2014-02-02 NOTE — Addendum Note (Signed)
Addended by: Carter Kitten on: 02/02/2014 05:00 PM   Modules accepted: Orders, Medications

## 2014-02-02 NOTE — Telephone Encounter (Signed)
emmi emailed °

## 2014-03-11 ENCOUNTER — Encounter: Payer: Self-pay | Admitting: Family Medicine

## 2014-03-11 ENCOUNTER — Ambulatory Visit (INDEPENDENT_AMBULATORY_CARE_PROVIDER_SITE_OTHER): Payer: Medicare PPO | Admitting: Family Medicine

## 2014-03-11 VITALS — BP 120/80 | HR 73 | Temp 97.6°F | Ht 65.0 in | Wt 142.5 lb

## 2014-03-11 DIAGNOSIS — I10 Essential (primary) hypertension: Secondary | ICD-10-CM

## 2014-03-11 NOTE — Progress Notes (Signed)
Pre visit review using our clinic review tool, if applicable. No additional management support is needed unless otherwise documented below in the visit note. 

## 2014-03-11 NOTE — Assessment & Plan Note (Signed)
Back in nml range. Encouraged exercise, weight maintenance and , healthy eating habits.

## 2014-03-11 NOTE — Patient Instructions (Signed)
Continue to work on healthy eating and regular exercise. Follow BP when you can.

## 2014-03-11 NOTE — Progress Notes (Signed)
   Subjective:    Patient ID: Karen Bowman, female    DOB: [birth year], 73 y.o.   MRN: [id number]  HPI  73 year old female presents for 1 month follow up. BP was up due to back pain and anxiety last OV.Marland Kitchen  Hypertension:  Well controlled today on amlodipine. Using medication without problems or lightheadedness:  None  Chest pain with exertion:None Edema:None Short of breath:None Average home BPs:not checking. Other issues:  BP Readings from Last 3 Encounters:  03/11/14 120/80  02/01/14 182/93  09/04/12 120/84    HAs had murmer since a child.   Review of Systems  Constitutional: Negative for fever and fatigue.  HENT: Negative for ear pain.   Eyes: Negative for pain.  Respiratory: Negative for chest tightness and shortness of breath.   Cardiovascular: Negative for chest pain, palpitations and leg swelling.  Gastrointestinal: Negative for abdominal pain.  Genitourinary: Negative for dysuria.       Objective:   Physical Exam  Constitutional: Vital signs are normal. She appears well-developed and well-nourished. She is cooperative.  Non-toxic appearance. She does not appear ill. No distress.  HENT:  Head: Normocephalic.  Right Ear: Hearing, tympanic membrane, external ear and ear canal normal. Tympanic membrane is not erythematous, not retracted and not bulging.  Left Ear: Hearing, tympanic membrane, external ear and ear canal normal. Tympanic membrane is not erythematous, not retracted and not bulging.  Nose: No mucosal edema or rhinorrhea. Right sinus exhibits no maxillary sinus tenderness and no frontal sinus tenderness. Left sinus exhibits no maxillary sinus tenderness and no frontal sinus tenderness.  Mouth/Throat: Uvula is midline, oropharynx is clear and moist and mucous membranes are normal.  Eyes: Conjunctivae, EOM and lids are normal. Pupils are equal, round, and reactive to light. Lids are everted and swept, no foreign bodies found.  Neck: Trachea normal and normal  range of motion. Neck supple. Carotid bruit is not present. No thyroid mass and no thyromegaly present.  Cardiovascular: Normal rate, regular rhythm, S1 normal, S2 normal, intact distal pulses and normal pulses.  Exam reveals no gallop and no friction rub.   Murmur heard.  Systolic murmur is present with a grade of 3/6  Pulmonary/Chest: Effort normal and breath sounds normal. No tachypnea. No respiratory distress. She has no decreased breath sounds. She has no wheezes. She has no rhonchi. She has no rales.  Abdominal: Soft. Normal appearance and bowel sounds are normal. There is no tenderness.  Neurological: She is alert.  Skin: Skin is warm, dry and intact. No rash noted.  Psychiatric: Her speech is normal and behavior is normal. Judgment and thought content normal. Her mood appears not anxious. Cognition and memory are normal. She does not exhibit a depressed mood.          Assessment & Plan:

## 2014-10-14 ENCOUNTER — Inpatient Hospital Stay
Admission: EM | Admit: 2014-10-14 | Discharge: 2014-10-18 | DRG: 280 | Disposition: A | Discharge status: Home / Self Care | Payer: Medicare PPO | Attending: Internal Medicine | Admitting: Internal Medicine

## 2014-10-14 ENCOUNTER — Inpatient Hospital Stay (HOSPITAL_COMMUNITY)
Admit: 2014-10-14 | Discharge: 2014-10-14 | Disposition: A | Discharge status: Home / Self Care | Payer: Medicare PPO | Attending: Internal Medicine | Admitting: Internal Medicine

## 2014-10-14 ENCOUNTER — Encounter: Payer: Self-pay | Admitting: Emergency Medicine

## 2014-10-14 ENCOUNTER — Emergency Department: Payer: Medicare PPO

## 2014-10-14 ENCOUNTER — Telehealth: Payer: Self-pay | Admitting: Family Medicine

## 2014-10-14 DIAGNOSIS — I480 Paroxysmal atrial fibrillation: Secondary | ICD-10-CM | POA: Diagnosis present

## 2014-10-14 DIAGNOSIS — Z9049 Acquired absence of other specified parts of digestive tract: Secondary | ICD-10-CM | POA: Diagnosis present

## 2014-10-14 DIAGNOSIS — Z7982 Long term (current) use of aspirin: Secondary | ICD-10-CM

## 2014-10-14 DIAGNOSIS — N281 Cyst of kidney, acquired: Secondary | ICD-10-CM | POA: Diagnosis present

## 2014-10-14 DIAGNOSIS — R7989 Other specified abnormal findings of blood chemistry: Secondary | ICD-10-CM | POA: Diagnosis not present

## 2014-10-14 DIAGNOSIS — Z91128 Patient's intentional underdosing of medication regimen for other reason: Secondary | ICD-10-CM | POA: Diagnosis present

## 2014-10-14 DIAGNOSIS — Z888 Allergy status to other drugs, medicaments and biological substances status: Secondary | ICD-10-CM

## 2014-10-14 DIAGNOSIS — I5021 Acute systolic (congestive) heart failure: Secondary | ICD-10-CM | POA: Diagnosis not present

## 2014-10-14 DIAGNOSIS — R011 Cardiac murmur, unspecified: Secondary | ICD-10-CM | POA: Diagnosis present

## 2014-10-14 DIAGNOSIS — I509 Heart failure, unspecified: Secondary | ICD-10-CM | POA: Diagnosis not present

## 2014-10-14 DIAGNOSIS — Z9889 Other specified postprocedural states: Secondary | ICD-10-CM

## 2014-10-14 DIAGNOSIS — J90 Pleural effusion, not elsewhere classified: Secondary | ICD-10-CM

## 2014-10-14 DIAGNOSIS — I214 Non-ST elevation (NSTEMI) myocardial infarction: Principal | ICD-10-CM | POA: Diagnosis present

## 2014-10-14 DIAGNOSIS — E876 Hypokalemia: Secondary | ICD-10-CM | POA: Diagnosis present

## 2014-10-14 DIAGNOSIS — Z90710 Acquired absence of both cervix and uterus: Secondary | ICD-10-CM | POA: Diagnosis not present

## 2014-10-14 DIAGNOSIS — R778 Other specified abnormalities of plasma proteins: Secondary | ICD-10-CM | POA: Diagnosis present

## 2014-10-14 DIAGNOSIS — I4891 Unspecified atrial fibrillation: Secondary | ICD-10-CM | POA: Diagnosis not present

## 2014-10-14 DIAGNOSIS — I517 Cardiomegaly: Secondary | ICD-10-CM | POA: Diagnosis not present

## 2014-10-14 DIAGNOSIS — K7689 Other specified diseases of liver: Secondary | ICD-10-CM | POA: Diagnosis not present

## 2014-10-14 DIAGNOSIS — I481 Persistent atrial fibrillation: Secondary | ICD-10-CM | POA: Diagnosis not present

## 2014-10-14 DIAGNOSIS — I5043 Acute on chronic combined systolic (congestive) and diastolic (congestive) heart failure: Secondary | ICD-10-CM | POA: Diagnosis not present

## 2014-10-14 DIAGNOSIS — I340 Nonrheumatic mitral (valve) insufficiency: Secondary | ICD-10-CM | POA: Diagnosis not present

## 2014-10-14 DIAGNOSIS — R945 Abnormal results of liver function studies: Secondary | ICD-10-CM

## 2014-10-14 DIAGNOSIS — Z882 Allergy status to sulfonamides status: Secondary | ICD-10-CM | POA: Diagnosis not present

## 2014-10-14 DIAGNOSIS — R0600 Dyspnea, unspecified: Secondary | ICD-10-CM | POA: Diagnosis not present

## 2014-10-14 DIAGNOSIS — E871 Hypo-osmolality and hyponatremia: Secondary | ICD-10-CM | POA: Diagnosis not present

## 2014-10-14 DIAGNOSIS — I10 Essential (primary) hypertension: Secondary | ICD-10-CM | POA: Diagnosis present

## 2014-10-14 DIAGNOSIS — J811 Chronic pulmonary edema: Secondary | ICD-10-CM | POA: Diagnosis not present

## 2014-10-14 DIAGNOSIS — R932 Abnormal findings on diagnostic imaging of liver and biliary tract: Secondary | ICD-10-CM | POA: Diagnosis not present

## 2014-10-14 DIAGNOSIS — R740 Nonspecific elevation of levels of transaminase and lactic acid dehydrogenase [LDH]: Secondary | ICD-10-CM | POA: Diagnosis not present

## 2014-10-14 DIAGNOSIS — Z79899 Other long term (current) drug therapy: Secondary | ICD-10-CM | POA: Diagnosis not present

## 2014-10-14 DIAGNOSIS — I5023 Acute on chronic systolic (congestive) heart failure: Secondary | ICD-10-CM | POA: Diagnosis not present

## 2014-10-14 HISTORY — DX: Cardiac murmur, unspecified: R01.1

## 2014-10-14 HISTORY — DX: Essential (primary) hypertension: I10

## 2014-10-14 LAB — CBC WITH DIFFERENTIAL/PLATELET
BASOS PCT: 1 %
Basophils Absolute: 0 10*3/uL (ref 0–0.1)
EOS PCT: 0 %
Eosinophils Absolute: 0 10*3/uL (ref 0–0.7)
HCT: 49.4 % — ABNORMAL HIGH (ref 35.0–47.0)
HEMOGLOBIN: 16 g/dL (ref 12.0–16.0)
Lymphocytes Relative: 25 %
Lymphs Abs: 2 10*3/uL (ref 1.0–3.6)
MCH: 29.5 pg (ref 26.0–34.0)
MCHC: 32.4 g/dL (ref 32.0–36.0)
MCV: 90.9 fL (ref 80.0–100.0)
Monocytes Absolute: 0.8 10*3/uL (ref 0.2–0.9)
Monocytes Relative: 10 %
Neutro Abs: 5.1 10*3/uL (ref 1.4–6.5)
Neutrophils Relative %: 64 %
Platelets: 267 10*3/uL (ref 150–440)
RBC: 5.44 MIL/uL — ABNORMAL HIGH (ref 3.80–5.20)
RDW: 14.4 % (ref 11.5–14.5)
WBC: 8 10*3/uL (ref 3.6–11.0)

## 2014-10-14 LAB — COMPREHENSIVE METABOLIC PANEL
ALT: 349 U/L — ABNORMAL HIGH (ref 14–54)
ANION GAP: 8 (ref 5–15)
AST: 204 U/L — ABNORMAL HIGH (ref 15–41)
Albumin: 4.1 g/dL (ref 3.5–5.0)
Alkaline Phosphatase: 117 U/L (ref 38–126)
BILIRUBIN TOTAL: 1 mg/dL (ref 0.3–1.2)
BUN: 21 mg/dL — ABNORMAL HIGH (ref 6–20)
CALCIUM: 8.6 mg/dL — AB (ref 8.9–10.3)
CO2: 22 mmol/L (ref 22–32)
CREATININE: 0.6 mg/dL (ref 0.44–1.00)
Chloride: 97 mmol/L — ABNORMAL LOW (ref 101–111)
GFR calc non Af Amer: >60 mL/min (ref >60)
GLUCOSE: 114 mg/dL — AB (ref 65–99)
Potassium: 4.2 mmol/L (ref 3.5–5.1)
Sodium: 127 mmol/L — ABNORMAL LOW (ref 135–145)
Total Protein: 6.8 g/dL (ref 6.5–8.1)

## 2014-10-14 LAB — APTT: aPTT: 26 seconds (ref 24–36)

## 2014-10-14 LAB — TROPONIN I
Troponin I: 0.22 ng/mL — ABNORMAL HIGH (ref <0.031)
Troponin I: 0.22 ng/mL — ABNORMAL HIGH (ref <0.031)

## 2014-10-14 LAB — MAGNESIUM: Magnesium: 2.1 mg/dL (ref 1.7–2.4)

## 2014-10-14 LAB — PROTIME-INR
INR: 1.05
PROTHROMBIN TIME: 13.9 s (ref 11.4–15.0)

## 2014-10-14 LAB — GLUCOSE, CAPILLARY: Glucose-Capillary: 101 mg/dL — ABNORMAL HIGH (ref 65–99)

## 2014-10-14 LAB — MRSA PCR SCREENING: MRSA BY PCR: NEGATIVE

## 2014-10-14 LAB — BRAIN NATRIURETIC PEPTIDE: B Natriuretic Peptide: 1018 pg/mL — ABNORMAL HIGH (ref 0.0–100.0)

## 2014-10-14 LAB — HEPARIN LEVEL (UNFRACTIONATED): Heparin Unfractionated: <0.1 IU/mL — ABNORMAL LOW (ref 0.30–0.70)

## 2014-10-14 MED ORDER — ASPIRIN 81 MG PO CHEW
CHEWABLE_TABLET | ORAL | Status: AC
  Filled 2014-10-14: qty 4

## 2014-10-14 MED ORDER — ACETAMINOPHEN 325 MG PO TABS: 650.0000 mg | ORAL_TABLET | Freq: Four times a day (QID) | ORAL | Status: DC | PRN

## 2014-10-14 MED ORDER — SODIUM CHLORIDE 0.9 % IV SOLN: 250.0000 mL | INTRAVENOUS | Status: DC | PRN

## 2014-10-14 MED ORDER — FUROSEMIDE 10 MG/ML IJ SOLN
20.0000 mg | Freq: Two times a day (BID) | INTRAMUSCULAR | Status: DC
  Administered 2014-10-15 – 2014-10-17 (×4): 20 mg via INTRAVENOUS
  Filled 2014-10-14 (×4): qty 2

## 2014-10-14 MED ORDER — ONDANSETRON HCL 4 MG/2ML IJ SOLN: 4.0000 mg | Freq: Four times a day (QID) | INTRAMUSCULAR | Status: DC | PRN

## 2014-10-14 MED ORDER — SODIUM CHLORIDE 0.9 % IJ SOLN: 3.0000 mL | INTRAMUSCULAR | Status: DC | PRN

## 2014-10-14 MED ORDER — HEPARIN BOLUS VIA INFUSION
3400.0000 [IU] | Freq: Once | INTRAVENOUS | Status: AC
  Administered 2014-10-14: 3400 [IU] via INTRAVENOUS
  Filled 2014-10-14: qty 3400

## 2014-10-14 MED ORDER — ASPIRIN 81 MG PO CHEW
324.0000 mg | CHEWABLE_TABLET | Freq: Once | ORAL | Status: AC
  Administered 2014-10-14: 324 mg via ORAL

## 2014-10-14 MED ORDER — ONDANSETRON HCL 4 MG PO TABS: 4.0000 mg | ORAL_TABLET | Freq: Four times a day (QID) | ORAL | Status: DC | PRN

## 2014-10-14 MED ORDER — DILTIAZEM HCL 25 MG/5ML IV SOLN
15.0000 mg | INTRAVENOUS | Status: AC
  Administered 2014-10-14: 15 mg via INTRAVENOUS
  Filled 2014-10-14: qty 5

## 2014-10-14 MED ORDER — ALBUTEROL SULFATE (2.5 MG/3ML) 0.083% IN NEBU: 2.5000 mg | INHALATION_SOLUTION | RESPIRATORY_TRACT | Status: DC | PRN

## 2014-10-14 MED ORDER — ASPIRIN EC 81 MG PO TBEC
81.0000 mg | DELAYED_RELEASE_TABLET | Freq: Every day | ORAL | Status: DC
  Administered 2014-10-15 – 2014-10-16 (×2): 81 mg via ORAL
  Filled 2014-10-14 (×2): qty 1

## 2014-10-14 MED ORDER — SODIUM CHLORIDE 0.9 % IJ SOLN
3.0000 mL | Freq: Two times a day (BID) | INTRAMUSCULAR | Status: DC
  Administered 2014-10-16 – 2014-10-19 (×7): 3 mL via INTRAVENOUS

## 2014-10-14 MED ORDER — ACETAMINOPHEN 650 MG RE SUPP: 650.0000 mg | Freq: Four times a day (QID) | RECTAL | Status: DC | PRN

## 2014-10-14 MED ORDER — SENNOSIDES-DOCUSATE SODIUM 8.6-50 MG PO TABS: 1.0000 | ORAL_TABLET | Freq: Every evening | ORAL | Status: DC | PRN

## 2014-10-14 MED ORDER — RISAQUAD PO CAPS
2.0000 | ORAL_CAPSULE | Freq: Every day | ORAL | Status: DC
  Administered 2014-10-14 – 2014-10-20 (×7): 2 via ORAL
  Filled 2014-10-14 (×7): qty 2

## 2014-10-14 MED ORDER — SODIUM CHLORIDE 0.9 % IV BOLUS (SEPSIS)
1000.0000 mL | INTRAVENOUS | Status: AC
  Administered 2014-10-14: 1000 mL via INTRAVENOUS

## 2014-10-14 MED ORDER — DILTIAZEM HCL 100 MG IV SOLR
5.0000 mg/h | INTRAVENOUS | Status: DC
  Administered 2014-10-14 – 2014-10-15 (×2): 5 mg/h via INTRAVENOUS
  Filled 2014-10-14 (×2): qty 100

## 2014-10-14 MED ORDER — PROBIOTIC PO CAPS: 2.0000 | ORAL_CAPSULE | Freq: Every day | ORAL | Status: DC

## 2014-10-14 MED ORDER — DILTIAZEM HCL 30 MG PO TABS
120.0000 mg | ORAL_TABLET | Freq: Once | ORAL | Status: AC
  Administered 2014-10-14: 120 mg via ORAL
  Filled 2014-10-14: qty 4

## 2014-10-14 MED ORDER — HEPARIN (PORCINE) IN NACL 100-0.45 UNIT/ML-% IJ SOLN
850.0000 [IU]/h | INTRAMUSCULAR | Status: DC
  Administered 2014-10-14: 950 [IU]/h via INTRAVENOUS
  Filled 2014-10-14 (×2): qty 250

## 2014-10-14 NOTE — Telephone Encounter (Signed)
Patient Name: Karen Bowman DOB: 11-Oct-1940 Initial Comment Caller states her legs are swelling down to her ankles. Nurse Assessment Nurse: Marcelline Deist, RN, Lynda Date/Time (Eastern Time): 10/14/2014 10:38:30 AM Confirm and document reason for call. If symptomatic, describe symptoms. ---Caller states her legs are swelling from knees down to her ankles. It has become worse over the last 2 weeks. She has arthritis, thought it was that. Stopped taking Amlodipine d/t pain in her ankles, started taking another BP medication -a natural product. Has had some panting with her breathing. Has a cough as well that nurse can hear. Has been very exhausted. No fever. Has the patient traveled out of the country within the last 30 days? ---Not Applicable Does the patient require triage? ---Yes Related visit to physician within the last 2 weeks? ---No Does the PT have any chronic conditions? (i.e. diabetes, asthma, etc.) ---Yes List chronic conditions. ---on BP rx., back issues Guidelines Guideline Title Affirmed Question Affirmed Notes Leg Swelling and Edema [1] Difficulty breathing with exertion (e.g., walking) AND [2] new onset or worsening Final Disposition User Go to ED Now (or PCP triage) Marcelline Deist, RN, [facility] - ED Disagree/Comply: Comply

## 2014-10-14 NOTE — ED Notes (Signed)
Pt placed on 2L 

## 2014-10-14 NOTE — H&P (Addendum)
[facility] NAME: Karen Bowman    MR#:  [id number]  DATE OF BIRTH:  12/18/1940  DATE OF ADMISSION:  10/14/2014  PRIMARY CARE PHYSICIAN: [name], MD   REQUESTING/REFERRING PHYSICIAN: Hinda Kehr, MD  CHIEF COMPLAINT:   Chief Complaint  Patient presents with  . Leg Swelling  . Shortness of Breath   shortness of breath, leg swelling and generalized weakness for 2 weeks.  HISTORY OF PRESENT ILLNESS:  Karen Bowman  is a 74 y.o. female with a known history of hypertension and heart murmur. She has had cough,shortness of breath, leg swelling and generalized weakness for 2 weeks. He stopped taking Norvasc due to leg edema for one week. She also complains of the decreased oral intake. But she denies any fever or chills, no orthopnea or nocturnal dyspnea. She was found to have a A. fib with RVR at 170s in the ED. She was treated with Cardizem 15 mg 1 dose IV without improvement and was started with a Cardizem drip and heparin drip. Chest x-ray show pulmonary edema.  PAST MEDICAL HISTORY:   Past Medical History  Diagnosis Date  . Hypertension   . Heart murmur     PAST SURGICAL HISTORY:   Past Surgical History  Procedure Laterality Date  . Abdominal hysterectomy    . Tonsillectomy    . Appendectomy    . Tumor removal      right arm  . Bladder suspension      SOCIAL HISTORY:   History  Substance Use Topics  . Smoking status: Never Smoker   . Smokeless tobacco: Not on file  . Alcohol Use: No    FAMILY HISTORY:  No family history on file.  DRUG ALLERGIES:   Allergies  Allergen Reactions  . Ciprofloxacin Nausea And Vomiting  . Hydrochlorothiazide Nausea And Vomiting  . Sulfa Antibiotics Nausea And Vomiting  . Valsartan Nausea And Vomiting    REVIEW OF SYSTEMS:  CONSTITUTIONAL: No fever, decreased oral intake and generalized weakness.  EYES: No blurred or double vision.  EARS, NOSE, AND THROAT: No  tinnitus or ear pain.  RESPIRATORY: Has cough and shortness of breath, no wheezing or hemoptysis.  CARDIOVASCULAR: No chest pain, orthopnea, but has a leg edema.  GASTROINTESTINAL: No nausea, vomiting, diarrhea or abdominal pain.  GENITOURINARY: No dysuria, hematuria.  ENDOCRINE: No polyuria, nocturia,  HEMATOLOGY: No anemia, easy bruising or bleeding SKIN: No rash or lesion. MUSCULOSKELETAL: No joint pain or arthritis.   NEUROLOGIC: No tingling, numbness, no focal weakness.  PSYCHIATRY: No anxiety or depression.   MEDICATIONS AT HOME:   Prior to Admission medications   Medication Sig Start Date End Date Taking? Authorizing Provider  ALOE VERA PO Take 1 capsule by mouth daily.    Yes Historical Provider, MD  Amino Acids (AMINO ACID PO) Take 5 mLs by mouth daily.   Yes Historical Provider, MD  amLODipine (NORVASC) 2.5 MG tablet Take 2.5 mg by mouth daily.   Yes Historical Provider, MD  Ascorbic Acid (VITAMIN C) 1000 MG tablet Take 1,000 mg by mouth daily.   Yes Historical Provider, MD  ASHWAGANDHA PO Take 1 tablet by mouth 2 (two) times daily.   Yes Historical Provider, MD  BEE POLLEN PO Take 1 tablet by mouth 2 (two) times daily.   Yes Historical Provider, MD  betamethasone dipropionate (DIPROLENE) 0.05 % cream Apply 1 application topically 2 (two) times daily as needed (for rash).   Yes Historical  Provider, MD  BIOTIN PO Take 1 tablet by mouth daily.   Yes Historical Provider, MD  Azucena Freed Serrata (BOSWELLIA PO) Take 1 tablet by mouth daily.   Yes Historical Provider, MD  Cholecalciferol 1000 UNITS tablet Take 1,000 Units by mouth daily.   Yes Historical Provider, MD  CINNAMON PO Take 2 tablets by mouth daily.   Yes Historical Provider, MD  Coenzyme Q10-Fish Oil-Vit E (CO-Q 10 OMEGA-3 FISH OIL) CAPS Take 1 capsule by mouth 2 (two) times daily.   Yes Historical Provider, MD  CRANBERRY PO Take 1 tablet by mouth daily.   Yes Historical Provider, MD  EYEBRIGHT HERB PO Take 1 tablet by  mouth daily.   Yes Historical Provider, MD  GARLIC PO Take 1 tablet by mouth 2 (two) times daily.   Yes Historical Provider, MD  Ginger, Zingiber officinalis, (GINGER PO) Take 1 tablet by mouth 2 (two) times daily.   Yes Historical Provider, MD  Glucosamine-Chondroitin (GLUCOSAMINE CHONDR COMPLEX PO) Take 2 tablets by mouth daily.   Yes Historical Provider, MD  GLUTAMINE PO Take 1 tablet by mouth 2 (two) times daily.   Yes Historical Provider, MD  HYALURONIC ACID-VITAMIN C PO Take 1 tablet by mouth 2 (two) times daily.   Yes Historical Provider, MD  MILK THISTLE PO Take 1 tablet by mouth 2 (two) times daily.   Yes Historical Provider, MD  Misc Natural Products (APPLE CIDER VINEGAR) TABS Take 1 tablet by mouth 4 (four) times daily.   Yes Historical Provider, MD  Multiple Vitamins-Minerals (MULTIVITAMIN PO) Take 1 tablet by mouth 3 (three) times daily.   Yes Historical Provider, MD  naproxen (NAPROSYN) 500 MG tablet Take 500 mg by mouth 2 (two) times daily as needed for mild pain.   Yes Historical Provider, MD  Nutritional Supplements (GAMMA-ORYZANOL PO) Take 1 tablet by mouth daily.   Yes Historical Provider, MD  OLIVE LEAF PO Take 1 capsule by mouth daily.   Yes Historical Provider, MD  OREGANO PO Take 1 capsule by mouth daily.   Yes Historical Provider, MD  OVER THE COUNTER MEDICATION Take 2 tablets by mouth 2 (two) times daily. Pt takes Advanced BP support tablet.   Yes Historical Provider, MD  OVER THE COUNTER MEDICATION Take 2 tablets by mouth daily. Pt takes Triveratrol Plus.   Yes Historical Provider, MD  OVER THE COUNTER MEDICATION Take 2 tablets by mouth 2 (two) times daily. Pt takes [employer].   Yes Historical Provider, MD  OVER THE COUNTER MEDICATION Take 2 tablets by mouth daily. Pt takes Cordyceps.   Yes Historical Provider, MD  OVER THE COUNTER MEDICATION Take 1 tablet by mouth 5 (five) times daily. Pt takes a Bone-Up by [employer].   Yes Historical Provider, MD  Probiotic CAPS  Take 2 capsules by mouth daily.   Yes Historical Provider, MD  QUERCETIN PO Take 1 capsule by mouth daily.   Yes Historical Provider, MD  ROYAL JELLY PO Take 1 capsule by mouth daily.   Yes Historical Provider, MD  RUTIN PO Take 1 tablet by mouth daily.   Yes Historical Provider, MD  TURMERIC CURCUMIN PO Take 1 capsule by mouth 2 (two) times daily.   Yes Historical Provider, MD  TURMERIC PO Take 1 tablet by mouth 2 (two) times daily.   Yes Historical Provider, MD      VITAL SIGNS:  Blood pressure 91/64, pulse 150, temperature 99.6 F (37.6 C), temperature source Oral, resp. rate 19, height 5\' 5"  (1.651  m), weight 72.179 kg (159 lb 2 oz), SpO2 92 %.  PHYSICAL EXAMINATION:  GENERAL:  74 y.o.-year-old patient lying in the bed with no acute distress.  EYES: Pupils equal, round, reactive to light and accommodation. No scleral icterus. Extraocular muscles intact.  HEENT: Head atraumatic, normocephalic. Oropharynx and nasopharynx clear.  NECK:  Supple, no jugular venous distention. No thyroid enlargement, no tenderness.  LUNGS: Normal breath sounds bilaterally, no wheezing, bilateral basilar rales. No use of accessory muscles of respiration.  CARDIOVASCULAR: S1, S2 irregular rate and rhythm. No murmurs, rubs, or gallops.  ABDOMEN: Soft, nontender, nondistended. Bowel sounds present. No organomegaly or mass.  EXTREMITIES: Bilateral lower extremity edema 2+, no cyanosis, or clubbing.  NEUROLOGIC: Cranial nerves II through XII are intact. Muscle strength 4/5 in all extremities. Sensation intact. Gait not checked.  PSYCHIATRIC: The patient is alert and oriented x 3.  SKIN: No obvious rash, lesion, or ulcer.   LABORATORY PANEL:   CBC  Recent Labs Lab 10/14/14 1320  WBC 8.0  HGB 16.0  HCT 49.4*  PLT 267   ------------------------------------------------------------------------------------------------------------------  Chemistries  No results for input(s): NA, K, CL, CO2, GLUCOSE, BUN,  CREATININE, CALCIUM, MG, AST, ALT, ALKPHOS, BILITOT in the last 168 hours.  Invalid input(s): GFRCGP ------------------------------------------------------------------------------------------------------------------  Cardiac Enzymes No results for input(s): TROPONINI in the last 168 hours. ------------------------------------------------------------------------------------------------------------------  RADIOLOGY:  Dg Chest Portable 1 View  10/14/2014   CLINICAL DATA:  New onset atrial fibrillation  EXAM: PORTABLE CHEST - 1 VIEW  COMPARISON:  None.  FINDINGS: There is mild bibasilar edema with small pleural effusions and cardiomegaly. There is slight pulmonary venous hypertension. No adenopathy. There is atherosclerotic change in aorta. There is no appreciable airspace consolidation.  IMPRESSION: Findings consistent with a degree of congestive heart failure.   Electronically Signed   By: Lowella Grip III M.D.   On: 10/14/2014 14:11    EKG:   Orders placed or performed during the hospital encounter of 10/14/14  . EKG 12-Lead  . EKG 12-Lead    IMPRESSION AND PLAN:   A. fib with RVR Acute CHF Elevated troponin due to demanding ischemia Hyponatremia Abnormal liver function test Hypertension  The patient will be admitted to stepdown unit. I will continue Cardizem drip and heparin drip, get echocardiogram and cardiology consult from Dr. Rockey Situ. I will start a CHF protocol and give Lasix 20 mg IV every 12 hours if blood pressure allows. Follow up BMP and magnesium level. I will check lipid panel and a follow-up troponin level, continue heparin drip. Further anticoagulation defer to cardiologist recommendation. For abnormal liver function tests, I will get ultrasound of abdomen and GI consult.  All the records are reviewed and case discussed with ED provider. Management plans discussed with the patient, the patient's husband and they are in agreement.  CODE STATUS: Full  code  TOTAL CRITICAL TIME TAKING CARE OF THIS PATIENT: 62 minutes.    Demetrios Loll M.D on 10/14/2014 at 3:40 PM  Between 7am to 6pm - Pager - [phone]  After 6pm go to [email or web address] - password [email or web address]  [facility]  Office  [phone]  CC: Primary care physician; [name], MD

## 2014-10-14 NOTE — Progress Notes (Signed)
ANTICOAGULATION CONSULT NOTE - Initial Consult  Pharmacy Consult for Heparin Drip  Indication: atrial fibrillation  Allergies  Allergen Reactions  . Ciprofloxacin Nausea And Vomiting  . Hydrochlorothiazide Nausea And Vomiting  . Sulfa Antibiotics Nausea And Vomiting  . Valsartan Nausea And Vomiting    Patient Measurements: Height: 5\' 5"  (165.1 cm) Weight: 159 lb 2 oz (72.179 kg) IBW/kg (Calculated) : 57 Heparin Dosing Weight: 68 kg  Vital Signs: Temp: 99.6 F (37.6 C) (07/15 1256) Temp Source: Oral (07/15 1256) BP: 119/100 mmHg (07/15 1428) Pulse Rate: 70 (07/15 1428)  Labs:  Recent Labs  10/14/14 1320  APTT 26  LABPROT 13.9  INR 1.05   CBC pending  CrCl cannot be calculated (Patient has no serum creatinine result on file.).- pending   Medical History: Past Medical History  Diagnosis Date  . Hypertension   . Heart murmur     Medications:  Scheduled:  . heparin  3,400 Units Intravenous Once   Infusions:  . diltiazem (CARDIZEM) infusion    . heparin      Assessment: 74 yo F  to start Heparin drip for Atrial Fibrillation  Goal of Therapy:  Heparin level 0.3-0.7 units/ml Monitor platelets by anticoagulation protocol: Yes   Plan:  Will give Heparin 3400 units x 1 as bolus and start Heparin drip at 950 units/hr. Will order AntiXa level in 8 hrs at 2300 on 7/15.  Chinita Greenland PharmD Clinical Pharmacist 10/14/2014

## 2014-10-14 NOTE — ED Provider Notes (Addendum)
[facility] Emergency Department Provider Note  ____________________________________________  Time seen: Approximately 1:26 PM  I have reviewed the triage vital signs and the nursing notes.   HISTORY  Chief Complaint Leg Swelling and Shortness of Breath    HPI Karen Bowman is a 74 y.o. female with a history of hypertension who takes amlodipine but has been noncompliant for one week because she believed it was causing her leg swelling who presents with about 2 weeks of fatigue, shortness of breath especially with exertion, and worsening bilateral lower extremity edema.  She reports that the onset has been gradual but it has been significantly worse over the last week.  She does not like going to the doctor so she has not had it checked, but her husband insisted that she go today because her symptoms are worse.  Her fatigue and shortness of breath or currently severe with any exertion and mild shortness of breath at rest.  She denies any recent infectious symptoms and has had no fever/chills, cough, abdominal pain, nausea/vomiting, nor diarrhea.  She denies chest pain.  Her biggest complaint is fatigue and the increasing exertional dyspnea.   Past Medical History  Diagnosis Date  . Hypertension   . Heart murmur     Patient Active Problem List   Diagnosis Date Noted  . A-fib 10/14/2014  . Acute CHF 10/14/2014  . Elevated troponin 10/14/2014    Past Surgical History  Procedure Laterality Date  . Abdominal hysterectomy    . Tonsillectomy    . Appendectomy    . Tumor removal      right arm  . Bladder suspension      Current Outpatient Rx  Name  Route  Sig  Dispense  Refill  . ALOE VERA PO   Oral   Take 1 capsule by mouth daily.          . Amino Acids (AMINO ACID PO)   Oral   Take 5 mLs by mouth daily.         Marland Kitchen amLODipine (NORVASC) 2.5 MG tablet   Oral   Take 2.5 mg by mouth daily.         . Ascorbic Acid (VITAMIN C) 1000 MG  tablet   Oral   Take 1,000 mg by mouth daily.         . ASHWAGANDHA PO   Oral   Take 1 tablet by mouth 2 (two) times daily.         Marland Kitchen BEE POLLEN PO   Oral   Take 1 tablet by mouth 2 (two) times daily.         . betamethasone dipropionate (DIPROLENE) 0.05 % cream   Topical   Apply 1 application topically 2 (two) times daily as needed (for rash).         Marland Kitchen BIOTIN PO   Oral   Take 1 tablet by mouth daily.         Azucena Freed Serrata (BOSWELLIA PO)   Oral   Take 1 tablet by mouth daily.         . Cholecalciferol 1000 UNITS tablet   Oral   Take 1,000 Units by mouth daily.         Marland Kitchen CINNAMON PO   Oral   Take 2 tablets by mouth daily.         . Coenzyme Q10-Fish Oil-Vit E (CO-Q 10 OMEGA-3 FISH OIL) CAPS   Oral   Take 1 capsule by mouth 2 (  two) times daily.         Marland Kitchen CRANBERRY PO   Oral   Take 1 tablet by mouth daily.         Marland Kitchen EYEBRIGHT HERB PO   Oral   Take 1 tablet by mouth daily.         Marland Kitchen GARLIC PO   Oral   Take 1 tablet by mouth 2 (two) times daily.         . Ginger, Zingiber officinalis, (GINGER PO)   Oral   Take 1 tablet by mouth 2 (two) times daily.         . Glucosamine-Chondroitin (GLUCOSAMINE CHONDR COMPLEX PO)   Oral   Take 2 tablets by mouth daily.         Marland Kitchen GLUTAMINE PO   Oral   Take 1 tablet by mouth 2 (two) times daily.         Marland Kitchen HYALURONIC ACID-VITAMIN C PO   Oral   Take 1 tablet by mouth 2 (two) times daily.         Marland Kitchen MILK THISTLE PO   Oral   Take 1 tablet by mouth 2 (two) times daily.         . Misc Natural Products (APPLE CIDER VINEGAR) TABS   Oral   Take 1 tablet by mouth 4 (four) times daily.         . Multiple Vitamins-Minerals (MULTIVITAMIN PO)   Oral   Take 1 tablet by mouth 3 (three) times daily.         . naproxen (NAPROSYN) 500 MG tablet   Oral   Take 500 mg by mouth 2 (two) times daily as needed for mild pain.         . Nutritional Supplements (GAMMA-ORYZANOL PO)   Oral    Take 1 tablet by mouth daily.         Marland Kitchen OLIVE LEAF PO   Oral   Take 1 capsule by mouth daily.         . OREGANO PO   Oral   Take 1 capsule by mouth daily.         Marland Kitchen OVER THE COUNTER MEDICATION   Oral   Take 2 tablets by mouth 2 (two) times daily. Pt takes Advanced BP support tablet.         Marland Kitchen OVER THE COUNTER MEDICATION   Oral   Take 2 tablets by mouth daily. Pt takes Triveratrol Plus.         Marland Kitchen OVER THE COUNTER MEDICATION   Oral   Take 2 tablets by mouth 2 (two) times daily. Pt takes [employer].         Marland Kitchen OVER THE COUNTER MEDICATION   Oral   Take 2 tablets by mouth daily. Pt takes Cordyceps.         Marland Kitchen OVER THE COUNTER MEDICATION   Oral   Take 1 tablet by mouth 5 (five) times daily. Pt takes a Bone-Up by [employer].         . Probiotic CAPS   Oral   Take 2 capsules by mouth daily.         Marland Kitchen QUERCETIN PO   Oral   Take 1 capsule by mouth daily.         Marland Kitchen ROYAL JELLY PO   Oral   Take 1 capsule by mouth daily.         Marland Kitchen RUTIN PO   Oral  Take 1 tablet by mouth daily.         . TURMERIC CURCUMIN PO   Oral   Take 1 capsule by mouth 2 (two) times daily.         . TURMERIC PO   Oral   Take 1 tablet by mouth 2 (two) times daily.           Allergies Ciprofloxacin; Hydrochlorothiazide; Sulfa antibiotics; and Valsartan  No family history on file.  Social History History  Substance Use Topics  . Smoking status: Never Smoker   . Smokeless tobacco: Not on file  . Alcohol Use: No    Review of Systems Constitutional: No fever/chills.  Generalized fatigue/exhaustion Eyes: No visual changes. ENT: No sore throat. Cardiovascular: Denies chest pain. Respiratory: Increasing exertional dyspnea Gastrointestinal: No abdominal pain.  No nausea, no vomiting.  No diarrhea.  No constipation. Genitourinary: Negative for dysuria. Musculoskeletal: Negative for back pain. Skin: Negative for rash. Neurological: Negative for headaches,  focal weakness or numbness.  10-point ROS otherwise negative.  ____________________________________________   PHYSICAL EXAM:  ED Triage Vitals  Enc Vitals Group     BP 10/14/14 1256 147/110 mmHg     Pulse Rate 10/14/14 1256 177     Resp 10/14/14 1256 22     Temp 10/14/14 1256 99.6 F (37.6 C)     Temp Source 10/14/14 1256 Oral     SpO2 10/14/14 1256 95 %     Weight 10/14/14 1256 150 lb (68.04 kg)     Height 10/14/14 1256 5\' 5"  (1.651 m)     Head Cir --      Peak Flow --      Pain Score 10/14/14 1257 5     Pain Loc --      Pain Edu? --      Excl. in [place]? --      Constitutional: Alert and oriented but appears ill. Eyes: Conjunctivae are normal. PERRL. EOMI. Head: Atraumatic. Nose: No congestion/rhinnorhea. Mouth/Throat: Mucous membranes are moist.  Oropharynx non-erythematous. Neck: No stridor.   Cardiovascular: Irregularly irregular rhythm with a rate ranging from the 160s to 190s. Grossly normal heart sounds.  Good peripheral circulation. Respiratory: Mild tachypnea.  No retractions. Lungs CTAB. Gastrointestinal: Soft and nontender. No distention. No abdominal bruits. No CVA tenderness. Musculoskeletal: No lower extremity tenderness nor edema.  No joint effusions. Neurologic:  Normal speech and language. No gross focal neurologic deficits are appreciated.  Skin:  Skin is warm, dry and intact. No rash noted. Psychiatric: Slightly anxious but essentially normal. Speech and behavior are normal.  ____________________________________________   LABS (all labs ordered are listed, but only abnormal results are displayed)  Labs Reviewed  CBC WITH DIFFERENTIAL/PLATELET - Abnormal; Notable for the following:    RBC 5.44 (*)    HCT 49.4 (*)    All other components within normal limits  PROTIME-INR  APTT  TROPONIN I  MAGNESIUM  COMPREHENSIVE METABOLIC PANEL  HEPARIN LEVEL (UNFRACTIONATED)   ____________________________________________  EKG  ED ECG REPORT I, Ivor Kishi,  Evian Derringer, the attending physician, personally viewed and interpreted this ECG.   Date: 10/14/2014  EKG Time: 13:04  Rate: 177  Rhythm: atrial fibrillation, rate 177  Axis: Normal  Intervals:Abnormal due to atrial fibrillation, but QRS is narrow and QTc is 41  ST&T Change: Non-specific ST segment / T-wave changes, but no evidence of acute ischemia.   ____________________________________________  RADIOLOGY  I, Milissa Fesperman, personally viewed and evaluated these images as part of my medical decision  making.   Dg Chest Portable 1 View  10/14/2014   CLINICAL DATA:  New onset atrial fibrillation  EXAM: PORTABLE CHEST - 1 VIEW  COMPARISON:  None.  FINDINGS: There is mild bibasilar edema with small pleural effusions and cardiomegaly. There is slight pulmonary venous hypertension. No adenopathy. There is atherosclerotic change in aorta. There is no appreciable airspace consolidation.  IMPRESSION: Findings consistent with a degree of congestive heart failure.   Electronically Signed   By: Lowella Grip III M.D.   On: 10/14/2014 14:11    ____________________________________________   PROCEDURES  Procedure(s) performed: None  Critical Care performed: Yes, see critical care note(s)   CRITICAL CARE Performed by: Hinda Kehr   Total critical care time: 45 minutes  Critical care time was exclusive of separately billable procedures and treating other patients.  Critical care was necessary to treat or prevent imminent or life-threatening deterioration.  Critical care was time spent personally by me on the following activities: development of treatment plan with patient and/or surrogate as well as nursing, discussions with consultants, evaluation of patient's response to treatment, examination of patient, obtaining history from patient or surrogate, ordering and performing treatments and interventions, ordering and review of laboratory studies, ordering and review of radiographic studies,  pulse oximetry and re-evaluation of patient's condition.  ____________________________________________   INITIAL IMPRESSION / ASSESSMENT AND PLAN / ED COURSE  Pertinent labs & imaging results that were available during my care of the patient were reviewed by me and considered in my medical decision making (see chart for details).  The patient presents in mild respiratory distress with a heart rate ranging from the 160s to the 190s in atrial fibrillation.  Her blood pressure upon arriving in the exam room is 118/66.  Believe the borderline hypertension is due to her atrial fibrillation.  We immediately established IV access, connected to her to the heart monitor, placed her on the Zoll, and after verifying she does not have an allergy to calcium channel blockers, we pushed a slow infusion of diltiazem 15 mg IV.  I was present and watched over the course of several minutes as her heart rate improved, ranging from the low 100s to the 130s.  She had no symptomatic Caryl Comes during that time and remained alert and oriented.  She is currently getting a normal saline infusion.  We are waiting for lab results.  I am giving diltiazem 120 mg by mouth but will have a low threshold for giving another IV push and/or starting an infusion given that she has likely been in rapid A. fib for 1-2 weeks given the onset of her symptoms.  I am also ordering heparin per pharmacy consult.  I gave ASA 81 x 4 PO.  The patient and her husband are aware of the plan.  ----------------------------------------- ~2:15 PM on 10/14/2014 -----------------------------------------  I reassessed the patient and she remains in A. fib from 100s to 130s with borderline hypertension.  She states that subjectively she feels slightly better as like she is breathing more comfortably.  Chest x-ray is consistent with mild CHF.  I will start her on a diltiazem infusion rather then bolus her and risk hemodynamic instability.  I will discuss with the  hospitalist.  ----------------------------------------- 3:12 PM on 10/14/2014 -----------------------------------------  Called lab about results not available.  Trop 0.22, electrolytes generally unremarkable per phone call.  LFTs reportedly slightly elevated, but pt has no abdominal tenderness.  Admitted to Dr. Bridgett Larsson (discussed in person).  ____________________________________________  FINAL CLINICAL IMPRESSION(S) /  ED DIAGNOSES  Final diagnoses:  Atrial fibrillation with rapid ventricular response  NSTEMI (non-ST elevated myocardial infarction)      NEW MEDICATIONS STARTED DURING THIS VISIT:  New Prescriptions   No medications on file     Hinda Kehr, MD 10/14/14 1514  Hinda Kehr, MD 10/14/14 2223

## 2014-10-14 NOTE — Progress Notes (Signed)
Pt admit from ED. On heparin and cardizem gtts. Denies pain. Lungs clear and diminished bibasilar. On 2l/[place] with good sats.  Afib with ventricular rate rate 90-115/min. HR increases to 140 with exertion.  SBP too low to give lasix at this time. Took all of her dinner tray. Echocardiogram in progress.

## 2014-10-14 NOTE — ED Notes (Signed)
Pt reports legs have been swelling for the past few days, pt reports shortness of breath. Pt ambulatory to triage, no apparent distress noted.

## 2014-10-14 NOTE — Telephone Encounter (Signed)
Agreed -

## 2014-10-14 NOTE — ED Notes (Signed)
Dr Bridgett Larsson aware of pt b/p 91/65, he said to call if sys drops below 90

## 2014-10-14 NOTE — ED Notes (Signed)
Pt placed on Zoll pads, Code cart at bedside.

## 2014-10-14 NOTE — ED Notes (Signed)
MD at bedside. 

## 2014-10-14 NOTE — ED Notes (Signed)
Pt states that she has been feeling tired, SOB X " a couple of weeks". Pt also c/o bilateral lower leg swelling. Pt color WNL.

## 2014-10-15 ENCOUNTER — Inpatient Hospital Stay: Payer: Medicare PPO

## 2014-10-15 LAB — URINALYSIS COMPLETE WITH MICROSCOPIC (ARMC ONLY)
BILIRUBIN URINE: NEGATIVE
Glucose, UA: NEGATIVE mg/dL
HGB URINE DIPSTICK: NEGATIVE
Ketones, ur: NEGATIVE mg/dL
LEUKOCYTES UA: NEGATIVE
Nitrite: NEGATIVE
PH: 5 (ref 5.0–8.0)
PROTEIN: NEGATIVE mg/dL
Specific Gravity, Urine: 1.015 (ref 1.005–1.030)

## 2014-10-15 LAB — HEMOGLOBIN A1C: Hgb A1c MFr Bld: 5.7 % (ref 4.0–6.0)

## 2014-10-15 LAB — CBC
HEMATOCRIT: 43 % (ref 35.0–47.0)
Hemoglobin: 14.2 g/dL (ref 12.0–16.0)
MCH: 29.8 pg (ref 26.0–34.0)
MCHC: 33 g/dL (ref 32.0–36.0)
MCV: 90.5 fL (ref 80.0–100.0)
PLATELETS: 212 10*3/uL (ref 150–440)
RBC: 4.76 MIL/uL (ref 3.80–5.20)
RDW: 14.3 % (ref 11.5–14.5)
WBC: 12 10*3/uL — AB (ref 3.6–11.0)

## 2014-10-15 LAB — HEPATIC FUNCTION PANEL
ALK PHOS: 92 U/L (ref 38–126)
ALT: 283 U/L — ABNORMAL HIGH (ref 14–54)
AST: 163 U/L — ABNORMAL HIGH (ref 15–41)
Albumin: 3.2 g/dL — ABNORMAL LOW (ref 3.5–5.0)
BILIRUBIN INDIRECT: 1 mg/dL — AB (ref 0.3–0.9)
BILIRUBIN TOTAL: 1.2 mg/dL (ref 0.3–1.2)
Bilirubin, Direct: 0.2 mg/dL (ref 0.1–0.5)
Total Protein: 5.4 g/dL — ABNORMAL LOW (ref 6.5–8.1)

## 2014-10-15 LAB — BASIC METABOLIC PANEL
ANION GAP: 10 (ref 5–15)
BUN: 21 mg/dL — ABNORMAL HIGH (ref 6–20)
CALCIUM: 8 mg/dL — AB (ref 8.9–10.3)
CO2: 21 mmol/L — AB (ref 22–32)
CREATININE: 0.63 mg/dL (ref 0.44–1.00)
Chloride: 97 mmol/L — ABNORMAL LOW (ref 101–111)
GFR calc non Af Amer: >60 mL/min (ref >60)
Glucose, Bld: 100 mg/dL — ABNORMAL HIGH (ref 65–99)
Potassium: 4 mmol/L (ref 3.5–5.1)
Sodium: 128 mmol/L — ABNORMAL LOW (ref 135–145)

## 2014-10-15 LAB — TSH: TSH: 2.64 u[IU]/mL (ref 0.350–4.500)

## 2014-10-15 LAB — HEPARIN LEVEL (UNFRACTIONATED)
HEPARIN UNFRACTIONATED: 0.59 [IU]/mL (ref 0.30–0.70)
Heparin Unfractionated: 0.72 IU/mL — ABNORMAL HIGH (ref 0.30–0.70)

## 2014-10-15 LAB — TROPONIN I: Troponin I: 0.18 ng/mL — ABNORMAL HIGH (ref <0.031)

## 2014-10-15 MED ORDER — AMIODARONE HCL IN DEXTROSE 360-4.14 MG/200ML-% IV SOLN
30.0000 mg/h | INTRAVENOUS | Status: DC
  Administered 2014-10-15 – 2014-10-16 (×3): 30 mg/h via INTRAVENOUS
  Filled 2014-10-15 (×11): qty 200

## 2014-10-15 MED ORDER — AMIODARONE LOAD VIA INFUSION
150.0000 mg | Freq: Once | INTRAVENOUS | Status: AC
  Administered 2014-10-15: 150 mg via INTRAVENOUS
  Filled 2014-10-15: qty 83.34

## 2014-10-15 MED ORDER — DIGOXIN 0.25 MG/ML IJ SOLN
0.2500 mg | Freq: Four times a day (QID) | INTRAMUSCULAR | Status: DC
  Administered 2014-10-15 – 2014-10-17 (×9): 0.25 mg via INTRAVENOUS
  Filled 2014-10-15 (×10): qty 2

## 2014-10-15 MED ORDER — HEPARIN (PORCINE) IN NACL 100-0.45 UNIT/ML-% IJ SOLN
1000.0000 [IU]/h | INTRAMUSCULAR | Status: DC
  Administered 2014-10-15: 1000 [IU]/h via INTRAVENOUS
  Filled 2014-10-15 (×2): qty 250

## 2014-10-15 MED ORDER — APIXABAN 5 MG PO TABS
5.0000 mg | ORAL_TABLET | Freq: Two times a day (BID) | ORAL | Status: DC
  Administered 2014-10-15: 5 mg via ORAL
  Filled 2014-10-15 (×2): qty 1

## 2014-10-15 MED ORDER — AMIODARONE HCL IN DEXTROSE 360-4.14 MG/200ML-% IV SOLN
60.0000 mg/h | INTRAVENOUS | Status: AC
  Administered 2014-10-15: 60 mg/h via INTRAVENOUS
  Filled 2014-10-15: qty 200

## 2014-10-15 NOTE — Progress Notes (Signed)
Contacted Dr. Johnsie Cancel in reference to pt being hypotensive and HR remains elevated.  Pt is currently in the 02'V systolic, but is not symptomatic, states that she feels better now than she has been feeling.  Spoke with Dr. About same.  Hold cardizem drip for now, go ahead with 1645 dose of Dig, and hold 1745 dose of Lasix.  Continue to monitor same.  Cell phone number of Dr. Gabriel Rainwater and will pass along to night shift nurse per his orders.

## 2014-10-15 NOTE — Progress Notes (Signed)
[facility] [place] NAME: Karen Bowman    MR#:  [id number]  DATE OF BIRTH:  1940/11/14  SUBJECTIVE:  CHIEF COMPLAINT:   Chief Complaint  Patient presents with  . Leg Swelling  . Shortness of Breath  SOB is better. Still Afib RVR at 120- 140's. On cardizem drip.  REVIEW OF SYSTEMS:  CONSTITUTIONAL: No fever, generalized weakness.  EYES: No blurred or double vision.  EARS, NOSE, AND THROAT: No tinnitus or ear pain.  RESPIRATORY: has cough, shortness of breath, no wheezing or hemoptysis.  CARDIOVASCULAR: No chest pain, orthopnea, has leg edema.  GASTROINTESTINAL: No nausea, vomiting, diarrhea or abdominal pain.  GENITOURINARY: No dysuria, hematuria.  ENDOCRINE: No polyuria, nocturia,  HEMATOLOGY: No anemia, easy bruising or bleeding SKIN: No rash or lesion. MUSCULOSKELETAL: No joint pain or arthritis.   NEUROLOGIC: No tingling, numbness, weakness.  PSYCHIATRY: No anxiety or depression.   DRUG ALLERGIES:   Allergies  Allergen Reactions  . Ciprofloxacin Nausea And Vomiting  . Hydrochlorothiazide Nausea And Vomiting  . Sulfa Antibiotics Nausea And Vomiting  . Valsartan Nausea And Vomiting    VITALS:  Blood pressure 110/70, pulse 88, temperature 98.8 F (37.1 C), temperature source Oral, resp. rate 25, height 5\' 5"  (1.651 m), weight 76.1 kg (167 lb 12.3 oz), SpO2 93 %.  PHYSICAL EXAMINATION:  GENERAL:  74 y.o.-year-old patient lying in the bed with no acute distress.  EYES: Pupils equal, round, reactive to light and accommodation. No scleral icterus. Extraocular muscles intact.  HEENT: Head atraumatic, normocephalic. Oropharynx and nasopharynx clear.  NECK:  Supple, no jugular venous distention. No thyroid enlargement, no tenderness.  LUNGS: Normal breath sounds bilaterally, no wheezing, rales,rhonchi or crepitation. No use of accessory muscles of respiration.  CARDIOVASCULAR: S1, S2 normal. No murmurs, rubs, or gallops.   ABDOMEN: Soft, nontender, nondistended. Bowel sounds present. No organomegaly or mass.  EXTREMITIES: leg edema 2+ , no cyanosis, or clubbing.  NEUROLOGIC: Cranial nerves II through XII are intact. Muscle strength 4/5 in all extremities. Sensation intact. Gait not checked.  PSYCHIATRIC: The patient is alert and oriented x 3.  SKIN: No obvious rash, lesion, or ulcer.    LABORATORY PANEL:   CBC  Recent Labs Lab 10/15/14 0108  WBC 12.0*  HGB 14.2  HCT 43.0  PLT 212   ------------------------------------------------------------------------------------------------------------------  Chemistries   Recent Labs Lab 10/14/14 1320 10/15/14 0108  NA 127* 128*  K 4.2 4.0  CL 97* 97*  CO2 22 21*  GLUCOSE 114* 100*  BUN 21* 21*  CREATININE 0.60 0.63  CALCIUM 8.6* 8.0*  MG 2.1  --   AST 204* 163*  ALT 349* 283*  ALKPHOS 117 92  BILITOT 1.0 1.2   ------------------------------------------------------------------------------------------------------------------  Cardiac Enzymes  Recent Labs Lab 10/15/14 0607  TROPONINI 0.18*   ------------------------------------------------------------------------------------------------------------------  RADIOLOGY:  Dg Chest Portable 1 View  10/14/2014   CLINICAL DATA:  New onset atrial fibrillation  EXAM: PORTABLE CHEST - 1 VIEW  COMPARISON:  None.  FINDINGS: There is mild bibasilar edema with small pleural effusions and cardiomegaly. There is slight pulmonary venous hypertension. No adenopathy. There is atherosclerotic change in aorta. There is no appreciable airspace consolidation.  IMPRESSION: Findings consistent with a degree of congestive heart failure.   Electronically Signed   By: Lowella Grip III M.D.   On: 10/14/2014 14:11    EKG:   Orders placed or performed during the hospital encounter of 10/14/14  . EKG 12-Lead  .  EKG 12-Lead    ASSESSMENT AND PLAN:   A. fib with RVR. continue Cardizem drip and heparin drip, f/u   echocardiogram and cardiology consult from Dr. Rockey Situ.  Further anticoagulation defer to cardiologist recommendation.  Acute CHF.  Lasix 20 mg IV every 12 hours if blood pressure allows. She got one dose lasix this am.  Elevated troponin due to demanding ischemia  Hyponatremia. Follow up BMP  Abnormal liver function test. F/u ultrasound of abdomen and GI consult.    All the records are reviewed and case discussed with Care Management/Social Workerr. Management plans discussed with the patient, family and they are in agreement.  CODE STATUS: Full code.  TOTAL CRITICAL TIME TAKING CARE OF THIS PATIENT: 46  minutes.   POSSIBLE D/C IN 3 DAYS, DEPENDING ON CLINICAL CONDITION.   Demetrios Loll M.D on 10/15/2014 at 8:30 AM  Between 7am to 6pm - Pager - [phone]  After 6pm go to [email or web address] - password [email or web address] [facility]  [facility]  Office  [phone]  CC: Primary care physician; [name], MD

## 2014-10-15 NOTE — Consult Note (Signed)
GI Inpatient Consult Note  Reason for Consult: Abnormal Liver function test   Attending Requesting Consult:Dr. Bridgett Larsson  History of Present Illness: Karen Bowman is a 74 y.o. female is being treated for cough, shortness of breath, edema and gneralized weakness.  She reports that she has not been able to sleep or eat very much for the past two weeks.  She reports that since she was admitted she is improving.  She reports that she has never had abnormal liver function tests before.  She takes a myriad of over the counter supplements.  She reports that over the past two weeks, she hasn't been taking all of them.  She is concerned that some of them may have caused her current condition.  She reports she would like someone to review all of them with her and tell her which are safe and beneficial for her to take.  She has no other GI complaints at this time.  Past Medical History:  Past Medical History  Diagnosis Date  . Hypertension   . Heart murmur     Problem List: Patient Active Problem List   Diagnosis Date Noted  . A-fib 10/14/2014  . Acute CHF 10/14/2014  . Elevated troponin 10/14/2014  . Hyponatremia 10/14/2014  . Abnormal LFTs 10/14/2014    Past Surgical History: Past Surgical History  Procedure Laterality Date  . Abdominal hysterectomy    . Tonsillectomy    . Appendectomy    . Tumor removal      right arm  . Bladder suspension      Allergies: Allergies  Allergen Reactions  . Ciprofloxacin Nausea And Vomiting  . Hydrochlorothiazide Nausea And Vomiting  . Sulfa Antibiotics Nausea And Vomiting  . Valsartan Nausea And Vomiting    Home Medications: Prescriptions prior to admission  Medication Sig Dispense Refill Last Dose  . ALOE VERA PO Take 1 capsule by mouth daily.    Past Week at Unknown time  . Amino Acids (AMINO ACID PO) Take 5 mLs by mouth daily.   Past Week at Unknown time  . amLODipine (NORVASC) 2.5 MG tablet Take 2.5 mg by mouth daily.   Past Month at  Unknown time  . Ascorbic Acid (VITAMIN C) 1000 MG tablet Take 1,000 mg by mouth daily.   Past Week at Unknown time  . ASHWAGANDHA PO Take 1 tablet by mouth 2 (two) times daily.   Past Week at Unknown time  . BEE POLLEN PO Take 1 tablet by mouth 2 (two) times daily.   Past Week at Unknown time  . betamethasone dipropionate (DIPROLENE) 0.05 % cream Apply 1 application topically 2 (two) times daily as needed (for rash).   PRN at PRN  . BIOTIN PO Take 1 tablet by mouth daily.   Past Week at Unknown time  . Boswellia Serrata (BOSWELLIA PO) Take 1 tablet by mouth daily.   Past Week at Unknown time  . Cholecalciferol 1000 UNITS tablet Take 1,000 Units by mouth daily.   Past Week at Unknown time  . CINNAMON PO Take 2 tablets by mouth daily.   Past Week at Unknown time  . Coenzyme Q10-Fish Oil-Vit E (CO-Q 10 OMEGA-3 FISH OIL) CAPS Take 1 capsule by mouth 2 (two) times daily.   Past Week at Unknown time  . CRANBERRY PO Take 1 tablet by mouth daily.   Past Week at Unknown time  . EYEBRIGHT HERB PO Take 1 tablet by mouth daily.   Past Week at Unknown time  .  GARLIC PO Take 1 tablet by mouth 2 (two) times daily.   Past Week at Unknown time  . Ginger, Zingiber officinalis, (GINGER PO) Take 1 tablet by mouth 2 (two) times daily.   Past Week at Unknown time  . Glucosamine-Chondroitin (GLUCOSAMINE CHONDR COMPLEX PO) Take 2 tablets by mouth daily.   Past Week at Unknown time  . GLUTAMINE PO Take 1 tablet by mouth 2 (two) times daily.   Past Week at Unknown time  . HYALURONIC ACID-VITAMIN C PO Take 1 tablet by mouth 2 (two) times daily.   Past Week at Unknown time  . MILK THISTLE PO Take 1 tablet by mouth 2 (two) times daily.   Past Week at Unknown time  . Misc Natural Products (APPLE CIDER VINEGAR) TABS Take 1 tablet by mouth 4 (four) times daily.   Past Week at Unknown time  . Multiple Vitamins-Minerals (MULTIVITAMIN PO) Take 1 tablet by mouth 3 (three) times daily.   Past Week at Unknown time  . naproxen  (NAPROSYN) 500 MG tablet Take 500 mg by mouth 2 (two) times daily as needed for mild pain.   Past Week at Unknown time  . Nutritional Supplements (GAMMA-ORYZANOL PO) Take 1 tablet by mouth daily.   Past Week at Unknown time  . OLIVE LEAF PO Take 1 capsule by mouth daily.   Past Week at Unknown time  . OREGANO PO Take 1 capsule by mouth daily.   Past Week at Unknown time  . OVER THE COUNTER MEDICATION Take 2 tablets by mouth 2 (two) times daily. Pt takes Advanced BP support tablet.   10/14/2014 at Unknown time  . OVER THE COUNTER MEDICATION Take 2 tablets by mouth daily. Pt takes Triveratrol Plus.   Past Week at Unknown time  . OVER THE COUNTER MEDICATION Take 2 tablets by mouth 2 (two) times daily. Pt takes [employer].   Past Week at Unknown time  . OVER THE COUNTER MEDICATION Take 2 tablets by mouth daily. Pt takes Cordyceps.   Past Week at Unknown time  . OVER THE COUNTER MEDICATION Take 1 tablet by mouth 5 (five) times daily. Pt takes a Bone-Up by [employer].   Past Week at Unknown time  . Probiotic CAPS Take 2 capsules by mouth daily.   Past Week at Unknown time  . QUERCETIN PO Take 1 capsule by mouth daily.   Past Week at Unknown time  . ROYAL JELLY PO Take 1 capsule by mouth daily.   Past Week at Unknown time  . RUTIN PO Take 1 tablet by mouth daily.   Past Week at Unknown time  . TURMERIC CURCUMIN PO Take 1 capsule by mouth 2 (two) times daily.   Past Week at Unknown time  . TURMERIC PO Take 1 tablet by mouth 2 (two) times daily.   Past Week at Unknown time   Home medication reconciliation was completed with the patient.   Scheduled Inpatient Medications:   . acidophilus  2 capsule Oral Daily  . apixaban  5 mg Oral BID  . aspirin EC  81 mg Oral Daily  . digoxin  0.25 mg Intravenous Q6H  . furosemide  20 mg Intravenous Q12H  . sodium chloride  3 mL Intravenous Q12H    Continuous Inpatient Infusions:   . amiodarone 60 mg/hr (10/15/14 1100)   Followed by  . amiodarone    .  diltiazem (CARDIZEM) infusion 5 mg/hr (10/15/14 1007)    PRN Inpatient Medications:  sodium chloride, acetaminophen **  OR** acetaminophen, albuterol, ondansetron **OR** ondansetron (ZOFRAN) IV, senna-docusate, sodium chloride  Family History: family history is not on file.  The patient's family history is negative for inflammatory bowel disorders, GI malignancy, or solid organ transplantation.  Social History:   reports that she has never smoked. She does not have any smokeless tobacco history on file. She reports that she does not drink alcohol or use illicit drugs. The patient denies ETOH, tobacco, or drug use.   Review of Systems: Constitutional: Weight is stable.  Eyes: No changes in vision. ENT: No oral lesions, sore throat.  GI: see HPI.  Heme/Lymph: No easy bruising.  GU: No hematuria.  Integumentary: No rashes.  Neuro: No headaches.  Psych: No depression/anxiety.  Endocrine: No heat/cold intolerance.  Allergic/Immunologic: No urticaria.   Physical Examination: BP 83/67 mmHg  Pulse 62  Temp(Src) 98.8 F (37.1 C) (Oral)  Resp 26  Ht _0  (1.651 m)  Wt 76.1 kg (167 lb 12.3 oz)  BMI 27.92 kg/m2  SpO2 93% Gen: NAD, alert and oriented x 4, very pleasant, good historian HEENT: PEERLA, EOMI, Neck: supple, no JVD or thyromegaly Chest: CTA bilaterally, no wheezes, crackles, or other adventitious sounds CV: RRR, no m/g/c/r Abd: soft, NT, ND, +BS in all four quadrants; no HSM, guarding, ridigity, or rebound tenderness Ext: BLE edema, well perfused with 2+ pulses, Skin: no rash or lesions noted Lymph: no LAD  Data: Lab Results  Component Value Date   WBC 12.0* 10/15/2014   HGB 14.2 10/15/2014   HCT 43.0 10/15/2014   MCV 90.5 10/15/2014   PLT 212 10/15/2014    Recent Labs Lab 10/14/14 1320 10/15/14 0108  HGB 16.0 14.2   Lab Results  Component Value Date   NA 128* 10/15/2014   K 4.0 10/15/2014   CL 97* 10/15/2014   CO2 21* 10/15/2014   BUN 21* 10/15/2014    CREATININE 0.63 10/15/2014   Lab Results  Component Value Date   ALT 283* 10/15/2014   AST 163* 10/15/2014   ALKPHOS 92 10/15/2014   BILITOT 1.2 10/15/2014    Recent Labs Lab 10/14/14 1320  APTT 26  INR 1.05   Imaging:  CLINICAL DATA: Abnormal liver function tests  EXAM: ULTRASOUND ABDOMEN COMPLETE  COMPARISON: None.  FINDINGS: Gallbladder: No gallstones. Wall adjacent to the liver is mildly thickened to 5 mm. Free wall appears normal in thickness.  Common bile duct: Diameter: 3.7 mm  Liver: Septated cyst versus 2 contiguous cysts noted in the left lobe, measuring a total of 3.2 cm x 2.8 cm x 3.3 cm. No other liver masses or focal lesions. Normal overall liver parenchymal echogenicity. Normal color Doppler flow in the portal vein and hepatic veins.  IVC: No abnormality visualized.  Pancreas: Visualized portion unremarkable.  Spleen: Size and appearance within normal limits.  Right Kidney: Length: 10.9 cm. Echogenicity within normal limits. No mass or hydronephrosis visualized.  Left Kidney: Length: 11.9 cm. Small midpole cyst measuring 11 mm. No other masses. Normal parenchymal echogenicity. No hydronephrosis.  Abdominal aorta: No aneurysm visualized.  Other findings: Large bilateral pleural effusions.  IMPRESSION: 1. 3.3 cm septated liver cyst in the left lobe. No other liver abnormality. 2. No acute findings. No evidence of acute cholecystitis. 3. Small left renal cyst. 4. Large bilateral pleural effusions. 5. No other abnormalities.   Electronically Signed  By: Lajean Manes M.D.  On: 10/15/2014 15:38 Assessment/Plan: Karen Bowman is a 74 y.o. female   Recommendations: The US abdomen showed Septated cyst versus  2 contiguous cysts noted in the left lobe, measuring a total of 3.2 cm x 2.8 cm x 3.3 cm.. The elevated LFT's may be related to her current condition or may be related to her nutritional supplements.We recommend  ordering hepatitis panel, ANA to check for acute and chronic conditions.  We recommend that patient should have all supplements and anything else she takes over the counter reviewed with her PCP once her acute situation is resolved. We will continue to follow with you. Thank you for the consult. Please call with questions or concerns.  Salvadore Farber, PA-C  I personally performed these services.

## 2014-10-15 NOTE — Progress Notes (Signed)
ANTICOAGULATION CONSULT NOTE - Initial Consult  Pharmacy Consult for Heparin Drip  Indication: atrial fibrillation  Allergies  Allergen Reactions  . Ciprofloxacin Nausea And Vomiting  . Hydrochlorothiazide Nausea And Vomiting  . Sulfa Antibiotics Nausea And Vomiting  . Valsartan Nausea And Vomiting    Patient Measurements: Height: 5\' 5"  (165.1 cm) Weight: 167 lb 12.3 oz (76.1 kg) IBW/kg (Calculated) : 57 Heparin Dosing Weight: 68 kg  Vital Signs: Temp: 98 F (36.7 C) (07/15 2000) Temp Source: Oral (07/15 1809) BP: 105/72 mmHg (07/15 2000) Pulse Rate: 120 (07/15 2000)  Labs:  Recent Labs  10/14/14 1320 10/14/14 1751 10/15/14 0108  HGB 16.0  --  14.2  HCT 49.4*  --  43.0  PLT 267  --  212  APTT 26  --   --   LABPROT 13.9  --   --   INR 1.05  --   --   HEPARINUNFRC <0.10*  --  0.59  CREATININE 0.60  --   --   TROPONINI 0.22* 0.22*  --    CBC pending  Estimated Creatinine Clearance: 64.8 mL/min (by C-G formula based on Cr of 0.6).- pending   Medical History: Past Medical History  Diagnosis Date  . Hypertension   . Heart murmur     Medications:  Scheduled:  . acidophilus  2 capsule Oral Daily  . aspirin EC  81 mg Oral Daily  . furosemide  20 mg Intravenous Q12H  . sodium chloride  3 mL Intravenous Q12H   Infusions:  . diltiazem (CARDIZEM) infusion 5 mg/hr (10/14/14 1900)  . heparin 950 Units/hr (10/14/14 1900)    Assessment: 74 yo F  to start Heparin drip for Atrial Fibrillation.  HL at 0108 on 7/16: 0.59  Goal of Therapy:  Heparin level 0.3-0.7 units/ml Monitor platelets by anticoagulation protocol: Yes   Plan:  Continue current heparin drip rate of 950 units/hr.  Will recheck HL to confirm dosing at 0900 on 7/16.  CBC ordered in the AM for 7/17.  Murrell Converse, PharmD Clinical Pharmacist 10/15/2014

## 2014-10-15 NOTE — Consult Note (Signed)
CARDIOLOGY CONSULT NOTE       Patient ID: Karen Bowman MRN: [id number] DOB/AGE: [birth year] 74 y.o.  Admit date: 10/14/2014 Referring Physician:  Bridgett Larsson Primary Physician: [name], MD Primary Cardiologist:  [place] Reason for Consultation:  CHF/Afib  Principal Problem:   A-fib Active Problems:   Acute CHF   Elevated troponin   Hyponatremia   Abnormal LFTs   HPI:   74 y.o. no previous cardiac history 2-3 weeks of malaise, dyspnea and LE edema.  Indicates her pulse is always high but admitted with rapid afib.  On norvasc for HTN stopped as ? If it was contributing to LE edema.  No chest pain. No history of TIA, bleeding CAD or valvular disease.  TSH normal.  BNP on admission 1018 with elevated LFTls  Denies chronic liver issues, hepatitis or GB disease No abdominal pain or history of ETOH abuse.  Denies history of hepatitis C or blood transfusion Despite cardizem drip afib rate still high and Rx limited by soft BP.    ROS All other systems reviewed and negative except as noted above  Past Medical History  Diagnosis Date  . Hypertension   . Heart murmur     History reviewed. No pertinent family history.  History   Social History  . Marital Status: Married    Spouse Name: N/A  . Number of Children: N/A  . Years of Education: N/A   Occupational History  . Not on file.   Social History Main Topics  . Smoking status: Never Smoker   . Smokeless tobacco: Not on file  . Alcohol Use: No  . Drug Use: No  . Sexual Activity: Not on file   Other Topics Concern  . Not on file   Social History Narrative  . No narrative on file    Past Surgical History  Procedure Laterality Date  . Abdominal hysterectomy    . Tonsillectomy    . Appendectomy    . Tumor removal      right arm  . Bladder suspension       . acidophilus  2 capsule Oral Daily  . apixaban  5 mg Oral BID  . aspirin EC  81 mg Oral Daily  . digoxin  0.25 mg Intravenous Q6H  . furosemide  20 mg  Intravenous Q12H  . sodium chloride  3 mL Intravenous Q12H   . amiodarone 60 mg/hr (10/15/14 1119)   Followed by  . amiodarone    . diltiazem (CARDIZEM) infusion 5 mg/hr (10/15/14 1007)    Physical Exam: Blood pressure 98/71, pulse 67, temperature 98.8 F (37.1 C), temperature source Oral, resp. rate 18, height 5\' 5"  (1.651 m), weight 167 lb 12.3 oz (76.1 kg), SpO2 98 %.   Affect appropriate Healthy:  appears stated age 74: normal Neck supple with no adenopathy JVP elevated  no bruits no thyromegaly Lungs basilar ratles  no wheezing and good diaphragmatic motion Heart:  S1/S2 no murmur, no rub, gallop or click PMI normal Abdomen: benighn, BS positve, no tenderness, no AAA no bruit.  No HSM or HJR Distal pulses intact with no bruits Plus one bilateral  edema Neuro non-focal Skin warm and dry No muscular weakness   Labs:   Lab Results  Component Value Date   WBC 12.0* 10/15/2014   HGB 14.2 10/15/2014   HCT 43.0 10/15/2014   MCV 90.5 10/15/2014   PLT 212 10/15/2014    Recent Labs Lab 10/15/14 0108  NA 128*  K 4.0  CL  97*  CO2 21*  BUN 21*  CREATININE 0.63  CALCIUM 8.0*  PROT 5.4*  BILITOT 1.2  ALKPHOS 92  ALT 283*  AST 163*  GLUCOSE 100*   Lab Results  Component Value Date   TROPONINI 0.18* 10/15/2014     Radiology: Dg Chest Portable 1 View  10/14/2014   CLINICAL DATA:  New onset atrial fibrillation  EXAM: PORTABLE CHEST - 1 VIEW  COMPARISON:  None.  FINDINGS: There is mild bibasilar edema with small pleural effusions and cardiomegaly. There is slight pulmonary venous hypertension. No adenopathy. There is atherosclerotic change in aorta. There is no appreciable airspace consolidation.  IMPRESSION: Findings consistent with a degree of congestive heart failure.   Electronically Signed   By: Lowella Grip III M.D.   On: 10/14/2014 14:11    EKG: afib rapid rate normal ST segments  Telemetry :  afib rates 120-140 10/15/2014    ASSESSMENT AND PLAN:    Afib:  TSH normal.  Add amiodarone and digoxin for rate control.  Continue heparin Pharmacy consult for eliquis.  TEE/DCC Monday if no conversion Echo ordered ? Being done at 3:00pm Discussed issues of stroke prevention, anticoagulation and possible TEE/DCC Monday with patient and she agrees with good understanding.    CHF:  Concern that elevated LFTls represent passive congestion and EF is low from rapid afib and possible non ischemic DCM ? Rate related since she indicates HR always high.  BNP elevated and CXR consistent with CHF.  Continue lasix and attempts at rate control/conversion  Signed: Jenkins Rouge 10/15/2014, 2:42 PM

## 2014-10-15 NOTE — Progress Notes (Signed)
ANTICOAGULATION CONSULT NOTE - Follow up   Pharmacy Consult for Heparin Drip  Indication: atrial fibrillation  Allergies  Allergen Reactions  . Ciprofloxacin Nausea And Vomiting  . Hydrochlorothiazide Nausea And Vomiting  . Sulfa Antibiotics Nausea And Vomiting  . Valsartan Nausea And Vomiting    Patient Measurements: Height: 5\' 5"  (165.1 cm) Weight:  (not done) IBW/kg (Calculated) : 57 Heparin Dosing Weight: 68 kg  Vital Signs: Temp: 98.3 F (36.8 C) (07/16 1900) Temp Source: Oral (07/16 1900) BP: 105/89 mmHg (07/16 2000) Pulse Rate: 103 (07/16 2100)  Labs:  Recent Labs  10/14/14 1320 10/14/14 1751 10/15/14 0108 10/15/14 0607 10/15/14 0853  HGB 16.0  --  14.2  --   --   HCT 49.4*  --  43.0  --   --   PLT 267  --  212  --   --   APTT 26  --   --   --   --   LABPROT 13.9  --   --   --   --   INR 1.05  --   --   --   --   HEPARINUNFRC <0.10*  --  0.59  --  0.72*  CREATININE 0.60  --  0.63  --   --   TROPONINI 0.22* 0.22*  --  0.18*  --    CBC pending  Estimated Creatinine Clearance: 64.8 mL/min (by C-G formula based on Cr of 0.63).- pending   Medical History: Past Medical History  Diagnosis Date  . Hypertension   . Heart murmur     Medications:  Scheduled:  . acidophilus  2 capsule Oral Daily  . aspirin EC  81 mg Oral Daily  . digoxin  0.25 mg Intravenous Q6H  . furosemide  20 mg Intravenous Q12H  . sodium chloride  3 mL Intravenous Q12H   Infusions:  . amiodarone 30 mg/hr (10/15/14 1900)  . diltiazem (CARDIZEM) infusion 5 mg/hr (10/15/14 1007)  . heparin      Assessment: 74 yo F  to start Heparin drip for Atrial Fibrillation. Pharmacy received Eliquis consult and patient's heparin drip was stopped and patient started on Eliquis 5mg  BID. Per cards note, it appears that patient should have been continued on Heparin Drip through Monday, 7/18. Patient received one dose of Eliquis 5mg  at 1100 on 7/16.     Goal of Therapy:  Heparin level 0.3-0.7  units/ml Monitor platelets by anticoagulation protocol: Yes   Plan:  Will order aPTT and Anti-Xa level for 2230. Plan on resuming heparin drip at 2300, 11 hours after Eliquis dose. Will order follow-up Anti-Xa and aPTT for 0700.    Pharmacy will continue to monitor and adjust per consult.   Currie Paris , PharmD Clinical Pharmacist 10/15/2014

## 2014-10-15 NOTE — Progress Notes (Signed)
Pt rested most of night, complaint of chronic neck pain given ice pack for same. Some relief noted. Pt states will talk with md in morning as having discomfort due to mattress.

## 2014-10-15 NOTE — Progress Notes (Signed)
ANTICOAGULATION CONSULT NOTE - Follow up   Pharmacy Consult for Heparin Drip  Indication: atrial fibrillation  Allergies  Allergen Reactions  . Ciprofloxacin Nausea And Vomiting  . Hydrochlorothiazide Nausea And Vomiting  . Sulfa Antibiotics Nausea And Vomiting  . Valsartan Nausea And Vomiting    Patient Measurements: Height: 5\' 5"  (165.1 cm) Weight: 167 lb 12.3 oz (76.1 kg) IBW/kg (Calculated) : 57 Heparin Dosing Weight: 68 kg  Vital Signs: Temp: 98.8 F (37.1 C) (07/16 0700) Temp Source: Oral (07/16 0700) BP: 96/80 mmHg (07/16 0900) Pulse Rate: 113 (07/16 0900)  Labs:  Recent Labs  10/14/14 1320 10/14/14 1751 10/15/14 0108 10/15/14 0607 10/15/14 0853  HGB 16.0  --  14.2  --   --   HCT 49.4*  --  43.0  --   --   PLT 267  --  212  --   --   APTT 26  --   --   --   --   LABPROT 13.9  --   --   --   --   INR 1.05  --   --   --   --   HEPARINUNFRC <0.10*  --  0.59  --  0.72*  CREATININE 0.60  --  0.63  --   --   TROPONINI 0.22* 0.22*  --  0.18*  --    CBC pending  Estimated Creatinine Clearance: 64.8 mL/min (by C-G formula based on Cr of 0.63).- pending   Medical History: Past Medical History  Diagnosis Date  . Hypertension   . Heart murmur     Medications:  Scheduled:  . acidophilus  2 capsule Oral Daily  . aspirin EC  81 mg Oral Daily  . furosemide  20 mg Intravenous Q12H  . sodium chloride  3 mL Intravenous Q12H   Infusions:  . diltiazem (CARDIZEM) infusion 5 mg/hr (10/15/14 0600)  . heparin 850 Units/hr (10/15/14 0941)    Assessment: 74 yo F  to start Heparin drip for Atrial Fibrillation.  HL at 0108 on 7/16: 0.59 HL @ 0850 was 0.72 (supratherapeutic)   Goal of Therapy:  Heparin level 0.3-0.7 units/ml Monitor platelets by anticoagulation protocol: Yes   Plan:  Will decrease heparin gtt to 850 units/hr.Will order heparin level @ 1730.   Larene Beach, PharmD Clinical Pharmacist 10/15/2014

## 2014-10-16 ENCOUNTER — Inpatient Hospital Stay (HOSPITAL_COMMUNITY)
Admit: 2014-10-16 | Discharge: 2014-10-16 | Disposition: A | Discharge status: Home / Self Care | Payer: Medicare PPO | Attending: Cardiovascular Disease | Admitting: Cardiovascular Disease

## 2014-10-16 DIAGNOSIS — I340 Nonrheumatic mitral (valve) insufficiency: Secondary | ICD-10-CM

## 2014-10-16 LAB — CBC
HCT: 47.2 % — ABNORMAL HIGH (ref 35.0–47.0)
Hemoglobin: 15.6 g/dL (ref 12.0–16.0)
MCH: 30.1 pg (ref 26.0–34.0)
MCHC: 33.1 g/dL (ref 32.0–36.0)
MCV: 90.9 fL (ref 80.0–100.0)
Platelets: 193 10*3/uL (ref 150–440)
RBC: 5.19 MIL/uL (ref 3.80–5.20)
RDW: 14.3 % (ref 11.5–14.5)
WBC: 6.9 10*3/uL (ref 3.6–11.0)

## 2014-10-16 LAB — HEPARIN LEVEL (UNFRACTIONATED)
HEPARIN UNFRACTIONATED: 1.04 [IU]/mL — AB (ref 0.30–0.70)
HEPARIN UNFRACTIONATED: 0.96 [IU]/mL — AB (ref 0.30–0.70)

## 2014-10-16 LAB — BASIC METABOLIC PANEL
Anion gap: 10 (ref 5–15)
BUN: 13 mg/dL (ref 6–20)
CALCIUM: 8.7 mg/dL — AB (ref 8.9–10.3)
CO2: 25 mmol/L (ref 22–32)
Chloride: 102 mmol/L (ref 101–111)
Creatinine, Ser: 0.61 mg/dL (ref 0.44–1.00)
GFR calc Af Amer: >60 mL/min (ref >60)
Glucose, Bld: 78 mg/dL (ref 65–99)
POTASSIUM: 3.3 mmol/L — AB (ref 3.5–5.1)
Sodium: 137 mmol/L (ref 135–145)

## 2014-10-16 LAB — APTT
APTT: 28 s (ref 24–36)
aPTT: 84 seconds — ABNORMAL HIGH (ref 24–36)

## 2014-10-16 LAB — MAGNESIUM: Magnesium: 1.9 mg/dL (ref 1.7–2.4)

## 2014-10-16 MED ORDER — POTASSIUM CHLORIDE CRYS ER 20 MEQ PO TBCR
40.0000 meq | EXTENDED_RELEASE_TABLET | Freq: Once | ORAL | Status: AC
  Administered 2014-10-16: 40 meq via ORAL
  Filled 2014-10-16: qty 2

## 2014-10-16 MED ORDER — APIXABAN 5 MG PO TABS
5.0000 mg | ORAL_TABLET | Freq: Two times a day (BID) | ORAL | Status: DC
  Administered 2014-10-16 – 2014-10-20 (×9): 5 mg via ORAL
  Filled 2014-10-16 (×10): qty 1

## 2014-10-16 MED ORDER — HEPARIN (PORCINE) IN NACL 100-0.45 UNIT/ML-% IJ SOLN
1000.0000 [IU]/h | INTRAMUSCULAR | Status: DC
  Filled 2014-10-16 (×2): qty 250

## 2014-10-16 NOTE — Progress Notes (Signed)
Subjective:  Denies SSCP, palpitations or Dyspnea Feels much better.  BP still soft  cardizem drip held Tolerating amiodarone and digoxin.  Echo being done Now despite order from Friday 5:35 pm I spoke with tech And ordered stat  Objective:  Filed Vitals:   10/16/14 0400 10/16/14 0500 10/16/14 0600 10/16/14 0800  BP: 97/65 94/58 111/70 126/101  Pulse: 85 70 81 60  Temp:    97.7 F (36.5 C)  TempSrc:    Oral  Resp: 13 17 15 16   Height:      Weight:  148 lb 2.4 oz (67.2 kg)    SpO2: 95% 95% 96% 97%    Intake/Output from previous day:  Intake/Output Summary (Last 24 hours) at 10/16/14 1058 Last data filed at 10/16/14 1000  Gross per 24 hour  Intake 1836.11 ml  Output   1250 ml  Net 586.11 ml    Physical Exam: Affect appropriate Healthy:  appears stated age HEENT: normal Neck supple with no adenopathy JVP normal no bruits no thyromegaly Lungs decreased BS bilaterally at base  no wheezing and good diaphragmatic motion Heart:  S1/S2 no murmur, no rub, gallop or click PMI normal Abdomen: benighn, BS positve, no tenderness, no AAA no bruit.  No HSM or HJR Distal pulses intact with no bruits No edema Neuro non-focal Skin warm and dry No muscular weakness   Lab Results: Basic Metabolic Panel:  Recent Labs  10/14/14 1320 10/15/14 0108 10/16/14 0708  NA 127* 128* 137  K 4.2 4.0 3.3*  CL 97* 97* 102  CO2 22 21* 25  GLUCOSE 114* 100* 78  BUN 21* 21* 13  CREATININE 0.60 0.63 0.61  CALCIUM 8.6* 8.0* 8.7*  MG 2.1  --  1.9   Liver Function Tests:  Recent Labs  10/14/14 1320 10/15/14 0108  AST 204* 163*  ALT 349* 283*  ALKPHOS 117 92  BILITOT 1.0 1.2  PROT 6.8 5.4*  ALBUMIN 4.1 3.2*   CBC:  Recent Labs  10/14/14 1320 10/15/14 0108 10/16/14 0708  WBC 8.0 12.0* 6.9  NEUTROABS 5.1  --   --   HGB 16.0 14.2 15.6  HCT 49.4* 43.0 47.2*  MCV 90.9 90.5 90.9  PLT 267 212 193   Cardiac Enzymes:  Recent Labs  10/14/14 1320 10/14/14 1751  10/15/14 0607  TROPONINI 0.22* 0.22* 0.18*   Hemoglobin A1C:  Recent Labs  10/15/14 0108  HGBA1C 5.7   Thyroid Function Tests:  Recent Labs  10/15/14 0108  TSH 2.640    Imaging: US Abdomen Complete  10/15/2014   CLINICAL DATA:  Abnormal liver function tests  EXAM: ULTRASOUND ABDOMEN COMPLETE  COMPARISON:  None.  FINDINGS: Gallbladder: No gallstones. Wall adjacent to the liver is mildly thickened to 5 mm. Free wall appears normal in thickness.  Common bile duct: Diameter: 3.7 mm  Liver: Septated cyst versus 2 contiguous cysts noted in the left lobe, measuring a total of 3.2 cm x 2.8 cm x 3.3 cm. No other liver masses or focal lesions. Normal overall liver parenchymal echogenicity. Normal color Doppler flow in the portal vein and hepatic veins.  IVC: No abnormality visualized.  Pancreas: Visualized portion unremarkable.  Spleen: Size and appearance within normal limits.  Right Kidney: Length: 10.9 cm. Echogenicity within normal limits. No mass or hydronephrosis visualized.  Left Kidney: Length: 11.9 cm. Small midpole cyst measuring 11 mm. No other masses. Normal parenchymal echogenicity. No hydronephrosis.  Abdominal aorta: No aneurysm visualized.  Other findings: Large bilateral pleural  effusions.  IMPRESSION: 1. 3.3 cm septated liver cyst in the left lobe. No other liver abnormality. 2. No acute findings.  No evidence of acute cholecystitis. 3. Small left renal cyst. 4. Large bilateral pleural effusions. 5. No other abnormalities.   Electronically Signed   By: Lajean Manes M.D.   On: 10/15/2014 15:38   Dg Chest Portable 1 View  10/14/2014   CLINICAL DATA:  New onset atrial fibrillation  EXAM: PORTABLE CHEST - 1 VIEW  COMPARISON:  None.  FINDINGS: There is mild bibasilar edema with small pleural effusions and cardiomegaly. There is slight pulmonary venous hypertension. No adenopathy. There is atherosclerotic change in aorta. There is no appreciable airspace consolidation.  IMPRESSION:  Findings consistent with a degree of congestive heart failure.   Electronically Signed   By: Lowella Grip III M.D.   On: 10/14/2014 14:11    Cardiac Studies:  ECG:   afib nonspecific ST T wave changes    Telemetry:  afib rates better 90-110 10/16/2014   Echo: pending  Medications:   . acidophilus  2 capsule Oral Daily  . apixaban  5 mg Oral BID  . aspirin EC  81 mg Oral Daily  . digoxin  0.25 mg Intravenous Q6H  . furosemide  20 mg Intravenous Q12H  . sodium chloride  3 mL Intravenous Q12H     . amiodarone 30 mg/hr (10/15/14 2207)  . diltiazem (CARDIZEM) infusion 5 mg/hr (10/15/14 1007)    Assessment/Plan:  Afib:  Discussed again need for TEE/DCC  She understands she will have "anesthesia" and have her heart shocked.  Should have 4 doses of eliquis in by then As it was started Saturday.  D/C heparin  Would stop cardizem drip post [place] and start low dose coreg.  I will read echo latter today  CHF:  I/O's positive but she feels better with good sats and no dyspnea.  Suspect EF poor given liver congestion and elevated LFTls.  Hopefully will get both doses of lasix today.  Increase dose/frequency in am if no significant diuresis  Pleural Effusions:  Can consider IR thoracentesis but on anticoagulation and I think [facility]  Have written for f/u CXR with LL decubitus in am  Anticoagulation:  Eliquis  5 bid d/c heparin  LFT;s  Hepatitis labs pending improved with Rx for CHF [place] benign likely secondary to passive congestion   Jenkins Rouge 10/16/2014, 10:58 AM

## 2014-10-16 NOTE — Progress Notes (Signed)
ANTICOAGULATION CONSULT NOTE - Follow up   Pharmacy Consult for Heparin Drip  Indication: atrial fibrillation  Allergies  Allergen Reactions  . Ciprofloxacin Nausea And Vomiting  . Hydrochlorothiazide Nausea And Vomiting  . Sulfa Antibiotics Nausea And Vomiting  . Valsartan Nausea And Vomiting    Patient Measurements: Height: 5\' 5"  (165.1 cm) Weight:  (not done) IBW/kg (Calculated) : 57 Heparin Dosing Weight: 68 kg  Vital Signs: Temp: 98.3 F (36.8 C) (07/16 1900) Temp Source: Oral (07/16 1900) BP: 109/68 mmHg (07/17 0000) Pulse Rate: 71 (07/17 0000)  Labs:  Recent Labs  10/14/14 1320 10/14/14 1751 10/15/14 0108 10/15/14 0607 10/15/14 0853  HGB 16.0  --  14.2  --   --   HCT 49.4*  --  43.0  --   --   PLT 267  --  212  --   --   APTT 26  --   --   --   --   LABPROT 13.9  --   --   --   --   INR 1.05  --   --   --   --   HEPARINUNFRC <0.10*  --  0.59  --  0.72*  CREATININE 0.60  --  0.63  --   --   TROPONINI 0.22* 0.22*  --  0.18*  --    CBC pending  Estimated Creatinine Clearance: 64.8 mL/min (by C-G formula based on Cr of 0.63).- pending   Medical History: Past Medical History  Diagnosis Date  . Hypertension   . Heart murmur     Medications:  Scheduled:  . acidophilus  2 capsule Oral Daily  . aspirin EC  81 mg Oral Daily  . digoxin  0.25 mg Intravenous Q6H  . furosemide  20 mg Intravenous Q12H  . sodium chloride  3 mL Intravenous Q12H   Infusions:  . amiodarone 30 mg/hr (10/15/14 2207)  . diltiazem (CARDIZEM) infusion 5 mg/hr (10/15/14 1007)  . heparin 1,000 Units/hr (10/15/14 2300)    Assessment: 74 yo F  to start Heparin drip for Atrial Fibrillation. Pharmacy received Eliquis consult and patient's heparin drip was stopped and patient started on Eliquis 5mg  BID. Per cards note, it appears that patient should have been continued on Heparin Drip through Monday, 7/18. Patient received one dose of Eliquis 5mg  at 1100 on 7/16.    APTT: 28.4, HL:  1.04  Goal of Therapy:  Heparin level 0.3-0.7 units/ml Monitor platelets by anticoagulation protocol: Yes   Plan:  APTT of 28 and HL of 1.04. HL elevated due to Eliquis dose at 1100. Recheck for aPTT and HL as well as CBC ordered for 0700.  Continue ordering aPTT levels every 6 hours until HL and aPTT values correlate. Continue Heparin drip at rate of 1000 units/hr.   Pharmacy will continue to monitor and adjust per consult.   Murrell Converse, PharmD Clinical Pharmacist 10/16/2014

## 2014-10-16 NOTE — Progress Notes (Signed)
[place] [facility] [place] NAME: Karen Bowman    MR#:  [id number]  DATE OF BIRTH:  May 24, 1940  SUBJECTIVE:  CHIEF COMPLAINT:   Chief Complaint  Patient presents with  . Leg Swelling  . Shortness of Breath  feels better. Still Afib RVR at 100- 120's. On amiodarone and heparin drip.  REVIEW OF SYSTEMS:  CONSTITUTIONAL: No fever, generalized weakness.  EYES: No blurred or double vision.  EARS, NOSE, AND THROAT: No tinnitus or ear pain.  RESPIRATORY: has cough, shortness of breath, no wheezing or hemoptysis.  CARDIOVASCULAR: No chest pain, orthopnea, has leg edema.  GASTROINTESTINAL: No nausea, vomiting, diarrhea or abdominal pain.  GENITOURINARY: No dysuria, hematuria.  ENDOCRINE: No polyuria, nocturia,  HEMATOLOGY: No anemia, easy bruising or bleeding SKIN: No rash or lesion. MUSCULOSKELETAL: No joint pain or arthritis.   NEUROLOGIC: No tingling, numbness, weakness.  PSYCHIATRY: No anxiety or depression.   DRUG ALLERGIES:   Allergies  Allergen Reactions  . Ciprofloxacin Nausea And Vomiting  . Hydrochlorothiazide Nausea And Vomiting  . Sulfa Antibiotics Nausea And Vomiting  . Valsartan Nausea And Vomiting    VITALS:  Blood pressure 126/101, pulse 60, temperature 97.7 F (36.5 C), temperature source Oral, resp. rate 16, height 5\' 5"  (1.651 m), weight 67.2 kg (148 lb 2.4 oz), SpO2 97 %.  PHYSICAL EXAMINATION:  GENERAL:  74 y.o.-year-old patient lying in the bed with no acute distress.  EYES: Pupils equal, round, reactive to light and accommodation. No scleral icterus. Extraocular muscles intact.  HEENT: Head atraumatic, normocephalic. Oropharynx and nasopharynx clear.  NECK:  Supple, no jugular venous distention. No thyroid enlargement, no tenderness.  LUNGS: Normal breath sounds bilaterally, no wheezing, rales,rhonchi or crepitation. No use of accessory muscles of respiration.  CARDIOVASCULAR: S1, S2 normal. No murmurs, rubs,  or gallops.  ABDOMEN: Soft, nontender, nondistended. Bowel sounds present. No organomegaly or mass.  EXTREMITIES: leg edema 1+ , no cyanosis, or clubbing.  NEUROLOGIC: Cranial nerves II through XII are intact. Muscle strength 4/5 in all extremities. Sensation intact. Gait not checked.  PSYCHIATRIC: The patient is alert and oriented x 3.  SKIN: No obvious rash, lesion, or ulcer.    LABORATORY PANEL:   CBC  Recent Labs Lab 10/16/14 0708  WBC 6.9  HGB 15.6  HCT 47.2*  PLT 193   ------------------------------------------------------------------------------------------------------------------  Chemistries   Recent Labs Lab 10/14/14 1320 10/15/14 0108 10/16/14 0708  NA 127* 128* 137  K 4.2 4.0 3.3*  CL 97* 97* 102  CO2 22 21* 25  GLUCOSE 114* 100* 78  BUN 21* 21* 13  CREATININE 0.60 0.63 0.61  CALCIUM 8.6* 8.0* 8.7*  MG 2.1  --   --   AST 204* 163*  --   ALT 349* 283*  --   ALKPHOS 117 92  --   BILITOT 1.0 1.2  --    ------------------------------------------------------------------------------------------------------------------  Cardiac Enzymes  Recent Labs Lab 10/15/14 0607  TROPONINI 0.18*   ------------------------------------------------------------------------------------------------------------------  RADIOLOGY:  US Abdomen Complete  10/15/2014   CLINICAL DATA:  Abnormal liver function tests  EXAM: ULTRASOUND ABDOMEN COMPLETE  COMPARISON:  None.  FINDINGS: Gallbladder: No gallstones. Wall adjacent to the liver is mildly thickened to 5 mm. Free wall appears normal in thickness.  Common bile duct: Diameter: 3.7 mm  Liver: Septated cyst versus 2 contiguous cysts noted in the left lobe, measuring a total of 3.2 cm x 2.8 cm x 3.3 cm. No other liver masses or focal lesions.  Normal overall liver parenchymal echogenicity. Normal color Doppler flow in the portal vein and hepatic veins.  IVC: No abnormality visualized.  Pancreas: Visualized portion unremarkable.   Spleen: Size and appearance within normal limits.  Right Kidney: Length: 10.9 cm. Echogenicity within normal limits. No mass or hydronephrosis visualized.  Left Kidney: Length: 11.9 cm. Small midpole cyst measuring 11 mm. No other masses. Normal parenchymal echogenicity. No hydronephrosis.  Abdominal aorta: No aneurysm visualized.  Other findings: Large bilateral pleural effusions.  IMPRESSION: 1. 3.3 cm septated liver cyst in the left lobe. No other liver abnormality. 2. No acute findings.  No evidence of acute cholecystitis. 3. Small left renal cyst. 4. Large bilateral pleural effusions. 5. No other abnormalities.   Electronically Signed   By: Lajean Manes M.D.   On: 10/15/2014 15:38   Dg Chest Portable 1 View  10/14/2014   CLINICAL DATA:  New onset atrial fibrillation  EXAM: PORTABLE CHEST - 1 VIEW  COMPARISON:  None.  FINDINGS: There is mild bibasilar edema with small pleural effusions and cardiomegaly. There is slight pulmonary venous hypertension. No adenopathy. There is atherosclerotic change in aorta. There is no appreciable airspace consolidation.  IMPRESSION: Findings consistent with a degree of congestive heart failure.   Electronically Signed   By: Lowella Grip III M.D.   On: 10/14/2014 14:11    EKG:   Orders placed or performed during the hospital encounter of 10/14/14  . EKG 12-Lead  . EKG 12-Lead    ASSESSMENT AND PLAN:   A. fib with RVR. Continue amiodarone drip and heparin drip, Cardizem drip was discontinued and digoxin was started.  f/u  echocardiogram and cardiologist. May d/c heparin drip and change to eliquis.  Acute CHF.  Continue Lasix 20 mg IV every 12 hours if blood pressure allows.  Elevated troponin due to demanding ischemia.   Hypokalemia. KCl,  F/u BMP. Hyponatremia. Improved.  Abnormal liver function test. F/u ultrasound of abdomen and GI consult.    All the records are reviewed and case discussed with Care Management/Social Workerr. Management  plans discussed with the patient, family and they are in agreement.  CODE STATUS: Full code.  TOTAL CRITICAL TIME TAKING CARE OF THIS PATIENT: 42  minutes.   POSSIBLE D/C IN 2-3 DAYS, DEPENDING ON CLINICAL CONDITION.   Demetrios Loll M.D on 10/16/2014 at 8:25 AM  Between 7am to 6pm - Pager - [phone]  After 6pm go to [email or web address] - password [email or web address]  [place] [facility]  Office  [phone]  CC: Primary care physician; [name], MD

## 2014-10-16 NOTE — Consult Note (Signed)
Pt doing better, slept well, much less SOB. Had a "great nite".  BP 97/64, HR 110-120, chest clear, heart ireg ireg PTT 84 on heparin and amiodarone drips.  Abd exam neg.  Recommend repeat LFT's in morning, if signif better the source was likely CHF.  No new suggestions from GI standpoint.

## 2014-10-16 NOTE — Progress Notes (Signed)
ANTICOAGULATION CONSULT NOTE - Follow up   Pharmacy Consult for Heparin Drip  Indication: atrial fibrillation  Allergies  Allergen Reactions  . Ciprofloxacin Nausea And Vomiting  . Hydrochlorothiazide Nausea And Vomiting  . Sulfa Antibiotics Nausea And Vomiting  . Valsartan Nausea And Vomiting    Patient Measurements: Height: 5\' 5"  (165.1 cm) Weight: 148 lb 2.4 oz (67.2 kg) IBW/kg (Calculated) : 57 Heparin Dosing Weight: 68 kg  Vital Signs: Temp: 97.7 F (36.5 C) (07/17 0800) Temp Source: Oral (07/17 0800) BP: 126/101 mmHg (07/17 0800) Pulse Rate: 60 (07/17 0800)  Labs:  Recent Labs  10/14/14 1320 10/14/14 1751 10/15/14 0108 10/15/14 0607 10/15/14 0853 10/15/14 2247 10/16/14 0708  HGB 16.0  --  14.2  --   --   --  15.6  HCT 49.4*  --  43.0  --   --   --  47.2*  PLT 267  --  212  --   --   --  193  APTT 26  --   --   --   --  28 84*  LABPROT 13.9  --   --   --   --   --   --   INR 1.05  --   --   --   --   --   --   HEPARINUNFRC <0.10*  --  0.59  --  0.72* 1.04* 0.96*  CREATININE 0.60  --  0.63  --   --   --  0.61  TROPONINI 0.22* 0.22*  --  0.18*  --   --   --    CBC pending  Estimated Creatinine Clearance: 57.2 mL/min (by C-G formula based on Cr of 0.61).- pending   Medical History: Past Medical History  Diagnosis Date  . Hypertension   . Heart murmur     Medications:  Scheduled:  . acidophilus  2 capsule Oral Daily  . aspirin EC  81 mg Oral Daily  . digoxin  0.25 mg Intravenous Q6H  . furosemide  20 mg Intravenous Q12H  . potassium chloride  40 mEq Oral Once  . sodium chloride  3 mL Intravenous Q12H   Infusions:  . amiodarone 30 mg/hr (10/15/14 2207)  . diltiazem (CARDIZEM) infusion 5 mg/hr (10/15/14 1007)  . heparin 1,000 Units/hr (10/15/14 2300)    Assessment: 74 yo F  to start Heparin drip for Atrial Fibrillation. Pharmacy received Eliquis consult and patient's heparin drip was stopped and patient started on Eliquis 5mg  BID. Per cards  note, it appears that patient should have been continued on Heparin Drip through Monday, 7/18. Patient received one dose of Eliquis 5mg  at 1100 on 7/16.    APTT: 28.4, HL: 1.04  Goal of Therapy:  Heparin level 0.3-0.7 units/ml Monitor platelets by anticoagulation protocol: Yes   Plan:  APTT of 28 and HL of 1.04. HL elevated due to Eliquis dose at 1100. Recheck for aPTT and HL as well as CBC ordered for 0700.  Continue ordering aPTT levels every 6 hours until HL and aPTT values correlate. Continue Heparin drip at rate of 1000 units/hr.   7/17: APTT within goal range however HL still elevated. Will order next APTT @ 1300  Pharmacy will continue to monitor and adjust per consult.   Larene Beach, PharmD Clinical Pharmacist 10/16/2014

## 2014-10-17 DIAGNOSIS — I481 Persistent atrial fibrillation: Secondary | ICD-10-CM

## 2014-10-17 DIAGNOSIS — R7989 Other specified abnormal findings of blood chemistry: Secondary | ICD-10-CM

## 2014-10-17 LAB — BASIC METABOLIC PANEL
ANION GAP: 10 (ref 5–15)
BUN: 17 mg/dL (ref 6–20)
CO2: 25 mmol/L (ref 22–32)
Calcium: 8.4 mg/dL — ABNORMAL LOW (ref 8.9–10.3)
Chloride: 102 mmol/L (ref 101–111)
Creatinine, Ser: 0.57 mg/dL (ref 0.44–1.00)
GFR calc non Af Amer: >60 mL/min (ref >60)
GLUCOSE: 89 mg/dL (ref 65–99)
Potassium: 3.5 mmol/L (ref 3.5–5.1)
Sodium: 137 mmol/L (ref 135–145)

## 2014-10-17 LAB — DIGOXIN LEVEL: Digoxin Level: 3 ng/mL (ref 0.8–2.0)

## 2014-10-17 MED ORDER — SODIUM CHLORIDE 0.9 % IJ SOLN: 3.0000 mL | INTRAMUSCULAR | Status: DC | PRN

## 2014-10-17 MED ORDER — POTASSIUM CHLORIDE CRYS ER 10 MEQ PO TBCR
10.0000 meq | EXTENDED_RELEASE_TABLET | Freq: Every day | ORAL | Status: DC
  Administered 2014-10-18 – 2014-10-20 (×3): 10 meq via ORAL
  Filled 2014-10-17 (×3): qty 1

## 2014-10-17 MED ORDER — SODIUM CHLORIDE 0.9 % IV SOLN: 250.0000 mL | INTRAVENOUS | Status: DC

## 2014-10-17 MED ORDER — SODIUM CHLORIDE 0.9 % IJ SOLN
3.0000 mL | Freq: Two times a day (BID) | INTRAMUSCULAR | Status: DC
  Administered 2014-10-17: 3 mL via INTRAVENOUS

## 2014-10-17 MED ORDER — POTASSIUM CHLORIDE CRYS ER 20 MEQ PO TBCR
40.0000 meq | EXTENDED_RELEASE_TABLET | Freq: Once | ORAL | Status: AC
  Administered 2014-10-17: 40 meq via ORAL
  Filled 2014-10-17: qty 2

## 2014-10-17 MED ORDER — AMIODARONE HCL 200 MG PO TABS
400.0000 mg | ORAL_TABLET | Freq: Two times a day (BID) | ORAL | Status: AC
  Administered 2014-10-17 – 2014-10-19 (×6): 400 mg via ORAL
  Filled 2014-10-17 (×6): qty 2

## 2014-10-17 MED ORDER — SODIUM CHLORIDE 0.9 % IV SOLN: 250.0000 mL | INTRAVENOUS | Status: DC | PRN

## 2014-10-17 MED ORDER — SODIUM CHLORIDE 0.9 % IJ SOLN
3.0000 mL | Freq: Two times a day (BID) | INTRAMUSCULAR | Status: DC
  Administered 2014-10-17 (×2): 3 mL via INTRAVENOUS

## 2014-10-17 MED ORDER — SODIUM CHLORIDE 0.9 % IV SOLN: INTRAVENOUS | Status: DC

## 2014-10-17 NOTE — Progress Notes (Signed)
[facility] [place] NAME: Karen Bowman    MR#:  [id number]  DATE OF BIRTH:  1941/02/08  SUBJECTIVE:  CHIEF COMPLAINT:   Chief Complaint  Patient presents with  . Leg Swelling  . Shortness of Breath  feels much  better. On amiodarone and eliquis,  off heparin drip.  REVIEW OF SYSTEMS:  CONSTITUTIONAL: No fever, generalized weakness.  EYES: No blurred or double vision.  EARS, NOSE, AND THROAT: No tinnitus or ear pain.  RESPIRATORY: has cough, shortness of breath, no wheezing or hemoptysis.  CARDIOVASCULAR: No chest pain, orthopnea, has leg edema 1+.  GASTROINTESTINAL: No nausea, vomiting, diarrhea or abdominal pain.  GENITOURINARY: No dysuria, hematuria.  ENDOCRINE: No polyuria, nocturia,  HEMATOLOGY: No anemia, easy bruising or bleeding SKIN: No rash or lesion. MUSCULOSKELETAL: No joint pain or arthritis.   NEUROLOGIC: No tingling, numbness, weakness.  PSYCHIATRY: No anxiety or depression.   DRUG ALLERGIES:   Allergies  Allergen Reactions  . Ciprofloxacin Nausea And Vomiting  . Hydrochlorothiazide Nausea And Vomiting  . Sulfa Antibiotics Nausea And Vomiting  . Valsartan Nausea And Vomiting    VITALS:  Blood pressure 129/93, pulse 101, temperature 97.8 F (36.6 C), temperature source Oral, resp. rate 18, height 5\' 5"  (1.651 m), weight 64.4 kg (141 lb 15.6 oz), SpO2 96 %.  PHYSICAL EXAMINATION:  GENERAL:  74 y.o.-year-old patient lying in the bed with no acute distress.  EYES: Pupils equal, round, reactive to light and accommodation. No scleral icterus. Extraocular muscles intact.  HEENT: Head atraumatic, normocephalic. Oropharynx and nasopharynx clear.  NECK:  Supple, no jugular venous distention. No thyroid enlargement, no tenderness.  LUNGS: Normal breath sounds bilaterally, no wheezing, rales,rhonchi or crepitation. No use of accessory muscles of respiration.  CARDIOVASCULAR: S1, S2 irregular rythma. No murmurs,  rubs, or gallops.  ABDOMEN: Soft, nontender, nondistended. Bowel sounds present. No organomegaly or mass.  EXTREMITIES: leg edema 1+ , no cyanosis, or clubbing.  NEUROLOGIC: Cranial nerves II through XII are intact. Muscle strength 4/5 in all extremities. Sensation intact. Gait not checked.  PSYCHIATRIC: The patient is alert and oriented x 3.  SKIN: No obvious rash, lesion, or ulcer.    LABORATORY PANEL:   CBC  Recent Labs Lab 10/16/14 0708  WBC 6.9  HGB 15.6  HCT 47.2*  PLT 193   ------------------------------------------------------------------------------------------------------------------  Chemistries   Recent Labs Lab 10/15/14 0108 10/16/14 0708 10/17/14 0436  NA 128* 137 137  K 4.0 3.3* 3.5  CL 97* 102 102  CO2 21* 25 25  GLUCOSE 100* 78 89  BUN 21* 13 17  CREATININE 0.63 0.61 0.57  CALCIUM 8.0* 8.7* 8.4*  MG  --  1.9  --   AST 163*  --   --   ALT 283*  --   --   ALKPHOS 92  --   --   BILITOT 1.2  --   --    ------------------------------------------------------------------------------------------------------------------  Cardiac Enzymes  Recent Labs Lab 10/15/14 0607  TROPONINI 0.18*   ------------------------------------------------------------------------------------------------------------------  RADIOLOGY:  US Abdomen Complete  10/15/2014   CLINICAL DATA:  Abnormal liver function tests  EXAM: ULTRASOUND ABDOMEN COMPLETE  COMPARISON:  None.  FINDINGS: Gallbladder: No gallstones. Wall adjacent to the liver is mildly thickened to 5 mm. Free wall appears normal in thickness.  Common bile duct: Diameter: 3.7 mm  Liver: Septated cyst versus 2 contiguous cysts noted in the left lobe, measuring a total of 3.2 cm x 2.8 cm x  3.3 cm. No other liver masses or focal lesions. Normal overall liver parenchymal echogenicity. Normal color Doppler flow in the portal vein and hepatic veins.  IVC: No abnormality visualized.  Pancreas: Visualized portion unremarkable.   Spleen: Size and appearance within normal limits.  Right Kidney: Length: 10.9 cm. Echogenicity within normal limits. No mass or hydronephrosis visualized.  Left Kidney: Length: 11.9 cm. Small midpole cyst measuring 11 mm. No other masses. Normal parenchymal echogenicity. No hydronephrosis.  Abdominal aorta: No aneurysm visualized.  Other findings: Large bilateral pleural effusions.  IMPRESSION: 1. 3.3 cm septated liver cyst in the left lobe. No other liver abnormality. 2. No acute findings.  No evidence of acute cholecystitis. 3. Small left renal cyst. 4. Large bilateral pleural effusions. 5. No other abnormalities.   Electronically Signed   By: Lajean Manes M.D.   On: 10/15/2014 15:38    EKG:   Orders placed or performed during the hospital encounter of 10/14/14  . EKG 12-Lead  . EKG 12-Lead  . EKG 12-Lead    ASSESSMENT AND PLAN:   A. fib with RVR.  Better controlled. Change amiodarone drip to po. continue digoxin and check level, continue eliquis.  echocardiogram show EF 35-40%. F/u TEE in am per Dr. Fletcher Anon.  Acute systolic CHF.  Continue Lasix 20 mg IV every 12 hours if blood pressure allows. Large bilateral pleural effusions (per [place]). Due to CHF.  Repeat CXR in am.   Elevated troponin due to demanding ischemia. Continue eliquis.  Hypokalemia.  Improved after supplement. Hyponatremia. Improved.  Abnormal liver function test. Possible due to CHF. Liver cyst per ultrasound of abdomen, f/u LFT per Dr. Tiffany Kocher.  D/w Dr. Fletcher Anon. All the records are reviewed and case discussed with Care Management/Social Workerr. Management plans discussed with the patient, family and they are in agreement.  CODE STATUS: Full code.  TOTAL CRITICAL TIME TAKING CARE OF THIS PATIENT: 46  minutes.   POSSIBLE D/C IN 2-3 DAYS, DEPENDING ON CLINICAL CONDITION.   Demetrios Loll M.D on 10/17/2014 at 2:10 PM  Between 7am to 6pm - Pager - [phone]  After 6pm go to [email or web address] - password [email or web address] [facility]  [facility]  Office  [phone]  CC: Primary care physician; [name], MD

## 2014-10-17 NOTE — Progress Notes (Signed)
Patient: Karen Bowman / Admit Date: 10/14/2014 / Date of Encounter: 10/17/2014, 8:06 AM   Subjective: Feels better this morning. Remains in Afib, rate controlled if at rest. No chest pain or SOB. Echo with EF 35-40%, diffuse HK, PASP 46 mmHg.   Review of Systems: Review of Systems  Constitutional: Positive for malaise/fatigue. Negative for fever and chills.  HENT: Positive for congestion.   Eyes: Negative for discharge and redness.  Respiratory: Negative for cough, sputum production, shortness of breath and wheezing.   Cardiovascular: Negative for chest pain, palpitations, orthopnea, claudication, leg swelling and PND.  Musculoskeletal: Negative for falls.  Skin: Negative for rash.  Neurological: Positive for weakness. Negative for speech change.  Endo/Heme/Allergies: Does not bruise/bleed easily.  Psychiatric/Behavioral: The patient is not nervous/anxious.       Objective: Telemetry: Afib with RVR, 80's to 130's Physical Exam: Blood pressure 132/84, pulse 41, temperature 97.4 F (36.3 C), temperature source Oral, resp. rate 17, height 5\' 5"  (1.651 m), weight 141 lb 15.6 oz (64.4 kg), SpO2 95 %. Body mass index is 23.63 kg/(m^2). General: Well developed, well nourished, in no acute distress. Head: Normocephalic, atraumatic, sclera non-icteric, no xanthomas, nares are without discharge. Neck: Negative for carotid bruits. JVP not elevated. Lungs: Clear bilaterally to auscultation without wheezes, rales, or rhonchi. Breathing is unlabored. Heart: Irregularly-irregular, S1 S2 without murmurs, rubs, or gallops.  Abdomen: Soft, non-tender, non-distended with normoactive bowel sounds. No rebound/guarding. Extremities: No clubbing or cyanosis. No edema. Distal pedal pulses are 2+ and equal bilaterally. Neuro: Alert and oriented X 3. Moves all extremities spontaneously. Psych:  Responds to questions appropriately with a normal affect.   Intake/Output Summary (Last 24 hours) at  10/17/14 0806 Last data filed at 10/17/14 0600  Gross per 24 hour  Intake  379.5 ml  Output   1852 ml  Net -1472.5 ml    Inpatient Medications:  . acidophilus  2 capsule Oral Daily  . apixaban  5 mg Oral BID  . aspirin EC  81 mg Oral Daily  . digoxin  0.25 mg Intravenous Q6H  . furosemide  20 mg Intravenous Q12H  . sodium chloride  3 mL Intravenous Q12H  . sodium chloride  3 mL Intravenous Q12H   Infusions:  . amiodarone 30 mg/hr (10/16/14 2335)  . diltiazem (CARDIZEM) infusion 5 mg/hr (10/15/14 1007)    Labs:  Recent Labs  10/14/14 1320  10/16/14 0708 10/17/14 0436  NA 127*  < > 137 137  K 4.2  < > 3.3* 3.5  CL 97*  < > 102 102  CO2 22  < > 25 25  GLUCOSE 114*  < > 78 89  BUN 21*  < > 13 17  CREATININE 0.60  < > 0.61 0.57  CALCIUM 8.6*  < > 8.7* 8.4*  MG 2.1  --  1.9  --   < > = values in this interval not displayed.  Recent Labs  10/14/14 1320 10/15/14 0108  AST 204* 163*  ALT 349* 283*  ALKPHOS 117 92  BILITOT 1.0 1.2  PROT 6.8 5.4*  ALBUMIN 4.1 3.2*    Recent Labs  10/14/14 1320 10/15/14 0108 10/16/14 0708  WBC 8.0 12.0* 6.9  NEUTROABS 5.1  --   --   HGB 16.0 14.2 15.6  HCT 49.4* 43.0 47.2*  MCV 90.9 90.5 90.9  PLT 267 212 193    Recent Labs  10/14/14 1320 10/14/14 1751 10/15/14 0607  TROPONINI 0.22* 0.22* 0.18*  Invalid input(s): POCBNP  Recent Labs  10/15/14 0108  HGBA1C 5.7     Weights: Filed Weights   10/14/14 1700 10/16/14 0500 10/17/14 2751  Weight: 167 lb 12.3 oz (76.1 kg) 148 lb 2.4 oz (67.2 kg) 141 lb 15.6 oz (64.4 kg)     Radiology/Studies:  US Abdomen Complete  10/15/2014   CLINICAL DATA:  Abnormal liver function tests  EXAM: ULTRASOUND ABDOMEN COMPLETE  COMPARISON:  None.  FINDINGS: Gallbladder: No gallstones. Wall adjacent to the liver is mildly thickened to 5 mm. Free wall appears normal in thickness.  Common bile duct: Diameter: 3.7 mm  Liver: Septated cyst versus 2 contiguous cysts noted in the left lobe,  measuring a total of 3.2 cm x 2.8 cm x 3.3 cm. No other liver masses or focal lesions. Normal overall liver parenchymal echogenicity. Normal color Doppler flow in the portal vein and hepatic veins.  IVC: No abnormality visualized.  Pancreas: Visualized portion unremarkable.  Spleen: Size and appearance within normal limits.  Right Kidney: Length: 10.9 cm. Echogenicity within normal limits. No mass or hydronephrosis visualized.  Left Kidney: Length: 11.9 cm. Small midpole cyst measuring 11 mm. No other masses. Normal parenchymal echogenicity. No hydronephrosis.  Abdominal aorta: No aneurysm visualized.  Other findings: Large bilateral pleural effusions.  IMPRESSION: 1. 3.3 cm septated liver cyst in the left lobe. No other liver abnormality. 2. No acute findings.  No evidence of acute cholecystitis. 3. Small left renal cyst. 4. Large bilateral pleural effusions. 5. No other abnormalities.   Electronically Signed   By: Lajean Manes M.D.   On: 10/15/2014 15:38   Dg Chest Portable 1 View  10/14/2014   CLINICAL DATA:  New onset atrial fibrillation  EXAM: PORTABLE CHEST - 1 VIEW  COMPARISON:  None.  FINDINGS: There is mild bibasilar edema with small pleural effusions and cardiomegaly. There is slight pulmonary venous hypertension. No adenopathy. There is atherosclerotic change in aorta. There is no appreciable airspace consolidation.  IMPRESSION: Findings consistent with a degree of congestive heart failure.   Electronically Signed   By: Lowella Grip III M.D.   On: 10/14/2014 14:11     Assessment and Plan  1. Afib with [id number], orders did not cross to specials this morning for her TEE/DCCV, thus preventing her from undergoing the procedure. She remains rate controlled when at rest, though with minimal movement or exertion she becomes tachycardic into the 1-teens to 130's. Because of this she would benefit from the above procedure prior to discharge. Will attempt to get procedure done today, if not  will need to be done first thing 7/19 in the AM. Continue Eliquis 5 mg bid. She will have received 5 doses by 7/19 AM. TEE/DCCV discussed with patient and questions answered. Discontinued amiodarone gtt and placed on po amiodarone taper 400 mg bid x 1 week, 200 mg bid x 1 week, then 200 mg daily. Continue diltiazem gtt until post DCCV then transition to po diltiazem. Discontinue aspirin as there is no need for dual therapy at this time. Echo with EF 35-40%, diffuse HK, PASP 46 mmHg. Possibly tachy-mediated cardiomyopathy. Recommend repeat echo once NSR has been obtained for at least 1 month to re-evaluate EF. If EF remains down would pursue ischemic work up.    2. Acute on chronic combined systolic and diastolic CHF: Minus 3.8G for the admission, she feels better with good sats and no dyspnea. Continue IV Lasix 20 mg q 12 hours. When able, s/p DCCV, would want to change  from diltiazem gtt to po beta blocker given EF with follow up echo in approximately 1 month or so. BP has been somewhat labile. Status post DCCV consider adding ACEi.   3. Pleural Effusions: Can consider IR thoracentesis but on anticoagulation and will likely do fine post DCCV. US abdomen with large bilateral effusions, diurese as above, anticoagulation: Eliquis 5 bid, d/c heparin  4. LFT's: Hepatitis labs pending improved with Rx for CHF, [place] benign likely secondary to passive congestion  5. Elevated troponin: Likely 2/2 Afib with RVR. Though cannot exclude ischemia given depressed EF and diffuse HK. Nop angina. Consider ischemic evaluation if EF does not improve with conversion to NSR  6. Hypokalemia: Replete to 4.0   Melvern Banker, PA-C Pager: [phone] 10/17/2014, 8:06 AM

## 2014-10-17 NOTE — Progress Notes (Signed)
Patient refused lasix due to TEE this morning.  She stated she does not want to be peeing every few minutes. Lungs clear.

## 2014-10-17 NOTE — Care Management (Signed)
Transfer to to 2a from ICU. Patient is on Eliquis 5mg  BID. O2 monitoring.

## 2014-10-17 NOTE — Progress Notes (Signed)
Patient digoxin level 3.0 , DR Bridgett Larsson was informed and discontinue tab digoxin, level in am

## 2014-10-17 NOTE — Care Management Important Message (Signed)
Important Message  Patient Details  Name: Karen Bowman MRN: [id number] Date of Birth: 10/01/1940   Medicare Important Message Given:  Yes-second notification given    Juliann Pulse A Allmond 10/17/2014, 11:03 AM

## 2014-10-17 NOTE — Progress Notes (Signed)
Patient has remains NPO after midnight

## 2014-10-17 NOTE — Care Management (Signed)
REceived patient from icu.  Patient admitted with a fib with rvr requiring a cardizem drip.  She is for TEE and cardioversion this day.  She has been started on Eliquis.  Provided patient with 30 day free coupon

## 2014-10-18 ENCOUNTER — Encounter
Admission: RE | Disposition: A | Discharge status: Home / Self Care | Payer: Self-pay | Source: Ambulatory Visit | Attending: Cardiovascular Disease

## 2014-10-18 ENCOUNTER — Encounter: Payer: Self-pay | Admitting: Anesthesiology

## 2014-10-18 ENCOUNTER — Ambulatory Visit: Payer: Medicare PPO | Admitting: Anesthesiology

## 2014-10-18 ENCOUNTER — Inpatient Hospital Stay: Payer: Medicare PPO

## 2014-10-18 ENCOUNTER — Inpatient Hospital Stay (HOSPITAL_COMMUNITY)
Admit: 2014-10-18 | Discharge: 2014-10-18 | Disposition: A | Discharge status: Home / Self Care | Payer: Medicare PPO | Attending: Physician Assistant | Admitting: Physician Assistant

## 2014-10-18 ENCOUNTER — Inpatient Hospital Stay
Admission: RE | Admit: 2014-10-18 | Discharge: 2014-10-20 | Disposition: A | Discharge status: Home / Self Care | Payer: Medicare PPO | Source: Ambulatory Visit | Attending: Cardiovascular Disease | Admitting: Cardiovascular Disease

## 2014-10-18 DIAGNOSIS — I4891 Unspecified atrial fibrillation: Secondary | ICD-10-CM

## 2014-10-18 HISTORY — PX: TEE WITHOUT CARDIOVERSION: SHX5443

## 2014-10-18 HISTORY — PX: ELECTROPHYSIOLOGIC STUDY: SHX172A

## 2014-10-18 LAB — COMPREHENSIVE METABOLIC PANEL
ALT: 118 U/L — ABNORMAL HIGH (ref 14–54)
AST: 41 U/L (ref 15–41)
Albumin: 2.8 g/dL — ABNORMAL LOW (ref 3.5–5.0)
Alkaline Phosphatase: 68 U/L (ref 38–126)
Anion gap: 9 (ref 5–15)
BUN: 15 mg/dL (ref 6–20)
CO2: 25 mmol/L (ref 22–32)
Calcium: 8.7 mg/dL — ABNORMAL LOW (ref 8.9–10.3)
Chloride: 103 mmol/L (ref 101–111)
Creatinine, Ser: 0.61 mg/dL (ref 0.44–1.00)
GFR calc non Af Amer: >60 mL/min (ref >60)
Glucose, Bld: 100 mg/dL — ABNORMAL HIGH (ref 65–99)
POTASSIUM: 3.8 mmol/L (ref 3.5–5.1)
SODIUM: 137 mmol/L (ref 135–145)
Total Bilirubin: 1.1 mg/dL (ref 0.3–1.2)
Total Protein: 5.7 g/dL — ABNORMAL LOW (ref 6.5–8.1)

## 2014-10-18 LAB — CBC WITH DIFFERENTIAL/PLATELET
BASOS PCT: 1 %
Basophils Absolute: 0.1 10*3/uL (ref 0–0.1)
EOS ABS: 0.1 10*3/uL (ref 0–0.7)
EOS PCT: 3 %
HCT: 45.8 % (ref 35.0–47.0)
Hemoglobin: 15.2 g/dL (ref 12.0–16.0)
LYMPHS ABS: 1.2 10*3/uL (ref 1.0–3.6)
LYMPHS PCT: 22 %
MCH: 30.1 pg (ref 26.0–34.0)
MCHC: 33.1 g/dL (ref 32.0–36.0)
MCV: 91 fL (ref 80.0–100.0)
MONOS PCT: 11 %
Monocytes Absolute: 0.6 10*3/uL (ref 0.2–0.9)
NEUTROS ABS: 3.4 10*3/uL (ref 1.4–6.5)
Neutrophils Relative %: 63 %
Platelets: 210 10*3/uL (ref 150–440)
RBC: 5.04 MIL/uL (ref 3.80–5.20)
RDW: 13.7 % (ref 11.5–14.5)
WBC: 5.4 10*3/uL (ref 3.6–11.0)

## 2014-10-18 LAB — DIGOXIN LEVEL: Digoxin Level: 2.1 ng/mL — ABNORMAL HIGH (ref 0.8–2.0)

## 2014-10-18 SURGERY — ECHOCARDIOGRAM, TRANSESOPHAGEAL
Anesthesia: General

## 2014-10-18 MED ORDER — DIGOXIN 125 MCG PO TABS
0.1250 mg | ORAL_TABLET | Freq: Every day | ORAL | Status: DC
Start: 2014-10-19 — End: 2014-10-19
  Administered 2014-10-19: 0.125 mg via ORAL
  Filled 2014-10-18: qty 1

## 2014-10-18 MED ORDER — BUTAMBEN-TETRACAINE-BENZOCAINE 2-2-14 % EX AERO
INHALATION_SPRAY | CUTANEOUS | Status: AC
  Filled 2014-10-18: qty 20

## 2014-10-18 MED ORDER — PROPOFOL 10 MG/ML IV BOLUS
INTRAVENOUS | Status: DC | PRN
  Administered 2014-10-18: 10 mg via INTRAVENOUS
  Administered 2014-10-18: 30 mg via INTRAVENOUS
  Administered 2014-10-18: 20 mg via INTRAVENOUS
  Administered 2014-10-18: 50 mg via INTRAVENOUS
  Administered 2014-10-18: 10 mg via INTRAVENOUS

## 2014-10-18 MED ORDER — FUROSEMIDE 20 MG PO TABS
20.0000 mg | ORAL_TABLET | Freq: Two times a day (BID) | ORAL | Status: DC
  Administered 2014-10-18 – 2014-10-20 (×4): 20 mg via ORAL
  Filled 2014-10-18 (×4): qty 1

## 2014-10-18 MED ORDER — SODIUM CHLORIDE 0.9 % IV SOLN
INTRAVENOUS | Status: DC | PRN
  Administered 2014-10-18: 08:00:00 via INTRAVENOUS

## 2014-10-18 NOTE — Anesthesia Preprocedure Evaluation (Signed)
Anesthesia Evaluation  Patient identified by MRN, date of birth, ID band Patient awake    Reviewed: Allergy & Precautions, H&P , NPO status , Patient's Chart, lab work & pertinent test results, reviewed documented beta blocker date and time   Airway Mallampati: III  TM Distance: >3 FB Neck ROM: limited    Dental no notable dental hx. (+) Caps   Pulmonary neg pulmonary ROS,  breath sounds clear to auscultation  Pulmonary exam normal       Cardiovascular Exercise Tolerance: Good hypertension, - Past MI Normal cardiovascular exam+ dysrhythmias Atrial Fibrillation Rhythm:regular Rate:Normal     Neuro/Psych negative neurological ROS  negative psych ROS   GI/Hepatic negative GI ROS, Neg liver ROS,   Endo/Other  negative endocrine ROS  Renal/GU negative Renal ROS  negative genitourinary   Musculoskeletal   Abdominal   Peds  Hematology negative hematology ROS (+)   Anesthesia Other Findings   Reproductive/Obstetrics negative OB ROS                             Anesthesia Physical Anesthesia Plan  ASA: III  Anesthesia Plan: General   Post-op Pain Management:    Induction:   Airway Management Planned:   Additional Equipment:   Intra-op Plan:   Post-operative Plan:   Informed Consent: I have reviewed the patients History and Physical, chart, labs and discussed the procedure including the risks, benefits and alternatives for the proposed anesthesia with the patient or authorized representative who has indicated his/her understanding and acceptance.     Plan Discussed with: Anesthesiologist, CRNA and Surgeon  Anesthesia Plan Comments:         Anesthesia Quick Evaluation

## 2014-10-18 NOTE — Progress Notes (Signed)
Dr. Marcille Blanco informed of B/P, no new orders at this time.

## 2014-10-18 NOTE — Progress Notes (Signed)
Patient refused lasix's stating, " I can't believe you want to give me something to make me go to the bathroom before my test." Patient educated on the need for her lasix's in detail and she continued to refuse.

## 2014-10-18 NOTE — Progress Notes (Signed)
*  PRELIMINARY RESULTS* Echocardiogram Echocardiogram Transesophageal has been performed.  Laqueta Jean Hege 10/18/2014, 8:10 AM

## 2014-10-18 NOTE — Progress Notes (Signed)
[place] [facility] [place] NAME: Karen Bowman    MR#:  [id number]  DATE OF BIRTH:  Jan 30, 1941  SUBJECTIVE:  CHIEF COMPLAINT:   Chief Complaint  Patient presents with  . Leg Swelling  . Shortness of Breath  no complaint, s/p TEE for cardioversion, but still Afib. HR is controlled.  REVIEW OF SYSTEMS:  CONSTITUTIONAL: No fever, no weakness.  EYES: No blurred or double vision.  EARS, NOSE, AND THROAT: No tinnitus or ear pain.  RESPIRATORY: has cough, shortness of breath, no wheezing or hemoptysis.  CARDIOVASCULAR: No chest pain, orthopnea, has better leg edema .  GASTROINTESTINAL: No nausea, vomiting, diarrhea or abdominal pain.  GENITOURINARY: No dysuria, hematuria.  ENDOCRINE: No polyuria, nocturia,  HEMATOLOGY: No anemia, easy bruising or bleeding SKIN: No rash or lesion. MUSCULOSKELETAL: No joint pain or arthritis.   NEUROLOGIC: No tingling, numbness, weakness.  PSYCHIATRY: No anxiety or depression.   DRUG ALLERGIES:   Allergies  Allergen Reactions  . Ciprofloxacin Nausea And Vomiting  . Hydrochlorothiazide Nausea And Vomiting  . Sulfa Antibiotics Nausea And Vomiting  . Valsartan Nausea And Vomiting    VITALS:  Blood pressure 129/78, pulse 98, temperature 98.4 F (36.9 C), temperature source Oral, resp. rate 18, height 5\' 5"  (1.651 m), weight 61.508 kg (135 lb 9.6 oz), SpO2 96 %.  PHYSICAL EXAMINATION:  GENERAL:  74 y.o.-year-old patient lying in the bed with no acute distress.  EYES: Pupils equal, round, reactive to light and accommodation. No scleral icterus. Extraocular muscles intact.  HEENT: Head atraumatic, normocephalic. Oropharynx and nasopharynx clear.  NECK:  Supple, no jugular venous distention. No thyroid enlargement, no tenderness.  LUNGS: Normal breath sounds bilaterally, no wheezing, rales,rhonchi or crepitation. No use of accessory muscles of respiration.  CARDIOVASCULAR: S1, S2 irregular rythm. No  murmurs, rubs, or gallops.  ABDOMEN: Soft, nontender, nondistended. Bowel sounds present. No organomegaly or mass.  EXTREMITIES: trace leg edema , no cyanosis, or clubbing.  NEUROLOGIC: Cranial nerves II through XII are intact. Muscle strength 4/5 in all extremities. Sensation intact. Gait not checked.  PSYCHIATRIC: The patient is alert and oriented x 3.  SKIN: No obvious rash, lesion, or ulcer.    LABORATORY PANEL:   CBC  Recent Labs Lab 10/18/14 0415  WBC 5.4  HGB 15.2  HCT 45.8  PLT 210   ------------------------------------------------------------------------------------------------------------------  Chemistries   Recent Labs Lab 10/16/14 0708  10/18/14 0415  NA 137  < > 137  K 3.3*  < > 3.8  CL 102  < > 103  CO2 25  < > 25  GLUCOSE 78  < > 100*  BUN 13  < > 15  CREATININE 0.61  < > 0.61  CALCIUM 8.7*  < > 8.7*  MG 1.9  --   --   AST  --   --  41  ALT  --   --  118*  ALKPHOS  --   --  68  BILITOT  --   --  1.1  < > = values in this interval not displayed. ------------------------------------------------------------------------------------------------------------------  Cardiac Enzymes  Recent Labs Lab 10/15/14 0607  TROPONINI 0.18*   ------------------------------------------------------------------------------------------------------------------  RADIOLOGY:  Dg Chest 2 View  10/18/2014   CLINICAL DATA:  Follow-up pleural effusions.  Subsequent encounter.  EXAM: CHEST  2 VIEW  COMPARISON:  Chest radiograph performed 10/14/2014  FINDINGS: Mildly increased small bilateral pleural effusions are seen, left greater than right. Associated mild bibasilar opacities are seen. No  pneumothorax is identified.  The cardiomediastinal silhouette is borderline enlarged. No acute osseous abnormalities are identified. Diffuse calcification is noted along the abdominal aorta.  IMPRESSION: Mildly increased small bilateral pleural effusions, left greater than right. Mild  bibasilar opacities seen. Borderline cardiomegaly. Diffuse calcification along the abdominal aorta.   Electronically Signed   By: Garald Balding M.D.   On: 10/18/2014 07:09    EKG:   Orders placed or performed during the hospital encounter of 10/14/14  . EKG 12-Lead  . EKG 12-Lead  . EKG 12-Lead  . EKG 12-Lead    ASSESSMENT AND PLAN:   A. fib with RVR.  Still Afib, Rate controlled. continue amiodarone po. Digoxin was discontinued due to high level, f/u level, continue eliquis.  S/p TEE with cardioversion.   Acute systolic CHF EF 36-46 %.  Continue Lasix 20 mg bid. Bilateral pleural effusions. Due to CHF.  small.   Elevated troponin due to demanding ischemia. Continue eliquis.  Hypokalemia.  Improved after supplement. Hyponatremia. Improved.  Abnormal liver function test. Possible due to CHF.  Better.   PT  D/w cardiologist PA, Mr. Idolina Primer. All the records are reviewed and case discussed with Care Management/Social Workerr. Management plans discussed with the patient, family and they are in agreement.  CODE STATUS: Full code.  TOTAL CRITICAL TIME TAKING CARE OF THIS PATIENT: 38  minutes.   POSSIBLE D/C 1-2 DAYS, DEPENDING ON CLINICAL CONDITION.   Demetrios Loll M.D on 10/18/2014 at 11:53 AM  Between 7am to 6pm - Pager - [phone]  After 6pm go to [email or web address] - password [email or web address] [facility]  [facility]  Office  [phone]  CC: Primary care physician; [name], MD

## 2014-10-18 NOTE — Anesthesia Procedure Notes (Signed)
Date/Time: 10/18/2014 7:35 AM Performed by: Doreen Salvage Pre-anesthesia Checklist: Patient identified, Emergency Drugs available, Suction available and Patient being monitored Patient Re-evaluated:Patient Re-evaluated prior to inductionOxygen Delivery Method: Nasal cannula

## 2014-10-18 NOTE — CV Procedure (Signed)
    Transesophageal Echocardiogram Note  Karen Bowman [id number] 21-Dec-1940  Procedure: Transesophageal Echocardiogram Indications: atrial fib with RVR  Procedure Details Consent: Obtained Time Out: Verified patient identification, verified procedure, site/side was marked, verified correct patient position, special equipment/implants available, Radiology Safety Procedures followed,  medications/allergies/relevent history reviewed, required imaging and test results available.  Performed  Medications: Propofol 120 mg for the TEE and CArdioversion   Left Ventrical:  Mild LV dysfunction . EF 45-50%  Mitral Valve: mild - mod. MR ( 2 small jets of MR )   Aortic Valve: normal , no AS, no significant AI  Tricuspid Valve: mild - mod TR   Pulmonic Valve: trivial PI  Left Atrium/ Left atrial appendage: no thrombi,  Moderate amount of spontaneous contrast   Atrial septum: no ASD or PFO by color flow   Aorta: mild calcification    Complications: No apparent complications Patient did tolerate procedure well.      Cardioversion Note  Karen Bowman [id number] [birth year]  Procedure: DC Cardioversion Indications: atrial fib   Procedure Details Consent: Obtained Time Out: Verified patient identification, verified procedure, site/side was marked, verified correct patient position, special equipment/implants available, Radiology Safety Procedures followed,  medications/allergies/relevent history reviewed, required imaging and test results available.  Performed  The patient has been on adequate anticoagulation.  The patient received IV Propofol 120 mg IV  for sedation for the TEE and cardioversion  ( see above ) .  Synchronous cardioversion was performed at 120  Joules.  The patient went into a junctional brady but then went back into atrial fib after 1 minute.  She then received additional shocks of 120 J and 200 J but remained in NSR   The cardioversion was unsuccessful      Complications: No apparent complications Patient did tolerate procedure well.   Thayer Headings, Brooke Bonito., MD, [facility] 10/18/2014, 8:24 AM

## 2014-10-18 NOTE — Transfer of Care (Signed)
Immediate Anesthesia Transfer of Care Note  Patient: Karen Bowman  Procedure(s) Performed: Procedure(s): TRANSESOPHAGEAL ECHOCARDIOGRAM (TEE) (N/A) CARDIOVERSION (N/A)  Patient Location: PACU and Short Stay  Anesthesia Type:General  Level of Consciousness: awake, alert  and oriented  Airway & Oxygen Therapy: Patient Spontanous Breathing and Patient connected to nasal cannula oxygen  Post-op Assessment: Report given to RN and Post -op Vital signs reviewed and stable  Post vital signs: Reviewed and stable  Last Vitals:  Filed Vitals:   10/18/14 0806  BP:   Pulse:   Temp:   Resp: 21    Complications: No apparent anesthesia complications

## 2014-10-18 NOTE — Plan of Care (Signed)
B/P 150/101 right arm lying, prime paged awaiting call back. No new orders at this time.

## 2014-10-18 NOTE — Consult Note (Signed)
Her AST and ALT are down significantly, likely due to CHF.  ALT usually takes longer to come down.  I will sign off, reconsult if needed

## 2014-10-18 NOTE — Plan of Care (Signed)
Problem: Phase II Progression Outcomes Goal: Other Phase II Outcomes/Goals Outcome: Completed/Met Date Met:  10/18/14 Pt is alert and oriented x 4, cardioversion performed in am, pt remains in afib with rate elevated, on medication management. Pt is up to bathroom with stand by assist, on room air, good appetite, vital signs remain stable. Patient denies pain. Care of patient taken over by another nurse at 15:00. Uneventful shift.

## 2014-10-18 NOTE — Anesthesia Postprocedure Evaluation (Signed)
  Anesthesia Post-op Note  Patient: Karen Bowman  Procedure(s) Performed: Procedure(s): TRANSESOPHAGEAL ECHOCARDIOGRAM (TEE) (N/A) CARDIOVERSION (N/A)  Anesthesia type:General  Patient location: PACU  Post pain: Pain level controlled  Post assessment: Post-op Vital signs reviewed, Patient's Cardiovascular Status Stable, Respiratory Function Stable, Patent Airway and No signs of Nausea or vomiting  Post vital signs: Reviewed and stable  Last Vitals:  Filed Vitals:   10/18/14 0832  BP: 127/90  Pulse: 72  Temp:   Resp: 34    Level of consciousness: awake, alert  and patient cooperative  Complications: No apparent anesthesia complications

## 2014-10-18 NOTE — Evaluation (Signed)
Physical Therapy Evaluation Patient Details Name: Karen Bowman MRN: [id number] DOB: 08-28-1940 Today's Date: 10/18/2014   History of Present Illness  presented to ER secondary to LE edema, SOB; admitted for acute hospitalization secondary to Afib with RVR (rates in 160-170s), initially requiring cardizem drip (now transitioned to PO).  Of note, patient status post unsuccessful cardioversion (7/19); plan to manage medically.  Clinical Impression  Upon evaluation, patient alert and oriented, follows all commands and demonstrates good safety awareness/insight.  Bilat UE/LE strength and ROM grossly WFL for basic transfers and mobility.  Able to complete bed mobility indep; sit/stand and basic transfers without assist device, mod indep; gait (220') without assist device, sup.  No LOB or significant safety concerns, but patient noted with HR increase to 148 with minimal exertion (asymptomatic) that recovers to baseline with seated rest.   RN informed/aware. Would benefit from continued skilled PT during acute hospitalization to monitor cardiopulmonary endurance and response to activity.  Recommend transition to home with [facility] (for continued monitoring as discharge) and transition to cardiac rehab as appropriate.  No PT follow up required upon discharge at this time.    Follow Up Recommendations No PT follow up [facility], cardiac rehab as appropriate)    Equipment Recommendations       Recommendations for Other Services       Precautions / Restrictions Precautions Precautions: Fall Restrictions Weight Bearing Restrictions: No      Mobility  Bed Mobility Overal bed mobility: Independent                Transfers Overall transfer level: Modified independent               General transfer comment: sit/stand from variety of surface heights without UE support, without assist device; no safety concerns  Ambulation/Gait Ambulation/Gait assistance: Supervision Ambulation Distance  (Feet): 220 Feet Assistive device: None       General Gait Details: reciprocal stepping pattern with fair step height/length; fair cadence and gait speed.  No significant safety concerns or LOB; however HR to 148 with minimal activity (patient asymptomatic), recovers to baseline with seated rest period.  RN informed/aware.  Stairs            Wheelchair Mobility    Modified Rankin (Stroke Patients Only)       Balance Overall balance assessment: Modified Independent                                           Pertinent Vitals/Pain Pain Assessment: No/denies pain    Home Living Family/patient expects to be discharged to:: Private residence Living Arrangements: Spouse/significant other Available Help at Discharge: Family Type of Home: House Home Access: Stairs to enter   Technical brewer of Steps: 3 Home Layout: One level Home Equipment: None      Prior Function Level of Independence: Independent         Comments: Indep with all household/community mobility; + driving; denies fall history     Hand Dominance        Extremity/Trunk Assessment   Upper Extremity Assessment: Overall WFL for tasks assessed           Lower Extremity Assessment: Overall WFL for tasks assessed         Communication   Communication: No difficulties  Cognition Arousal/Alertness: Awake/alert Behavior During Therapy: WFL for tasks assessed/performed Overall Cognitive Status: Within Functional Limits  for tasks assessed                      General Comments      Exercises Other Exercises Other Exercises: Toilet transfer without assist device, distant sup/mod indep; sit/stand from standard toilet height, mod indep. Able to negotiate surface changes, environmental obstacles and narrowed spaces without LOB or safety concern (8 minutes)      Assessment/Plan    PT Assessment Patient needs continued PT services  PT Diagnosis Generalized  weakness   PT Problem List Decreased activity tolerance;Decreased knowledge of precautions;Decreased safety awareness;Cardiopulmonary status limiting activity  PT Treatment Interventions Gait training;Functional mobility training;Therapeutic activities;Therapeutic exercise;Patient/family education   PT Goals (Current goals can be found in the Care Plan section) Acute Rehab PT Goals Patient Stated Goal: "to go home" PT Goal Formulation: With patient Time For Goal Achievement: 11/01/14 Potential to Achieve Goals: Good    Frequency Min 2X/week   Barriers to discharge        Co-evaluation               End of Session Equipment Utilized During Treatment: Gait belt Activity Tolerance: Patient tolerated treatment well Patient left: in bed;with call bell/phone within reach;with bed alarm set Nurse Communication: Mobility status (HR response to activity)         Time: 5686-1683 PT Time Calculation (min) (ACUTE ONLY): 18 min   Charges:   PT Evaluation $Initial PT Evaluation Tier I: 1 Procedure PT Treatments $Therapeutic Activity: 8-22 mins   PT G Codes:       Armany Mano H. Owens Shark, PT, DPT, NCS 10/18/2014, 4:43 PM [phone]

## 2014-10-19 ENCOUNTER — Telehealth: Payer: Self-pay

## 2014-10-19 DIAGNOSIS — I5023 Acute on chronic systolic (congestive) heart failure: Secondary | ICD-10-CM

## 2014-10-19 LAB — DIGOXIN LEVEL: Digoxin Level: 1.4 ng/mL (ref 0.8–2.0)

## 2014-10-19 MED ORDER — DIGOXIN 125 MCG PO TABS: 0.1250 mg | ORAL_TABLET | Freq: Every day | ORAL | Status: DC

## 2014-10-19 MED ORDER — AMIODARONE HCL 200 MG PO TABS
200.0000 mg | ORAL_TABLET | Freq: Two times a day (BID) | ORAL | Status: DC
  Administered 2014-10-20: 200 mg via ORAL
  Filled 2014-10-19: qty 1

## 2014-10-19 MED ORDER — APIXABAN 5 MG PO TABS: 5.0000 mg | ORAL_TABLET | Freq: Two times a day (BID) | ORAL | Status: DC

## 2014-10-19 MED ORDER — FUROSEMIDE 20 MG PO TABS: 20.0000 mg | ORAL_TABLET | Freq: Two times a day (BID) | ORAL | Status: DC

## 2014-10-19 MED ORDER — AMIODARONE HCL 400 MG PO TABS: 400.0000 mg | ORAL_TABLET | Freq: Two times a day (BID) | ORAL | Status: DC

## 2014-10-19 MED ORDER — METOPROLOL TARTRATE 25 MG PO TABS
25.0000 mg | ORAL_TABLET | Freq: Two times a day (BID) | ORAL | Status: DC
Start: 2014-10-19 — End: 2014-10-20
  Administered 2014-10-19 – 2014-10-20 (×3): 25 mg via ORAL
  Filled 2014-10-19 (×3): qty 1

## 2014-10-19 MED ORDER — DIGOXIN 125 MCG PO TABS
0.0625 mg | ORAL_TABLET | Freq: Every day | ORAL | Status: DC
  Administered 2014-10-20: 0.0625 mg via ORAL
  Filled 2014-10-19: qty 1

## 2014-10-19 NOTE — Progress Notes (Signed)
ANTICOAGULATION CONSULT NOTE - Initial Consult  Pharmacy Consult for Apixaban Indication: atrial fibrillation  Allergies  Allergen Reactions  . Ciprofloxacin Nausea And Vomiting  . Hydrochlorothiazide Nausea And Vomiting  . Sulfa Antibiotics Nausea And Vomiting  . Valsartan Nausea And Vomiting    Patient Measurements: Height: 5\' 5"  (165.1 cm) Weight: 127 lb 4.8 oz (57.743 kg) IBW/kg (Calculated) : 57  Vital Signs: Temp: 97.9 F (36.6 C) (07/20 1227) Temp Source: Oral (07/20 0800) BP: 109/57 mmHg (07/20 1227) Pulse Rate: 91 (07/20 1227)  Labs:  Recent Labs  10/17/14 0436 10/18/14 0415  HGB  --  15.2  HCT  --  45.8  PLT  --  210  CREATININE 0.57 0.61    Estimated Creatinine Clearance: 57.2 mL/min (by C-G formula based on Cr of 0.61).   Medical History: Past Medical History  Diagnosis Date  . Hypertension   . Heart murmur       Assessment: 74 yo female with new onset AFib on apixaban for anticoagulation 7/19: Scr 0.61, Hgb 15.2, Plt 210   Goal of Therapy:  Monitor platelets by anticoagulation protocol: Yes   Plan:  Will continue current orders for Apixaban 5 mg PO BID  Rayna Sexton L 10/19/2014,3:13 PM

## 2014-10-19 NOTE — Progress Notes (Signed)
[place] [facility] [place] NAME: Karen Bowman    MR#:  [id number]  DATE OF BIRTH:  06-25-1940  SUBJECTIVE:  CHIEF COMPLAINT:   Chief Complaint  Patient presents with  . Leg Swelling  . Shortness of Breath  no complaint, s/p TEE with  successful cardioversion, still Afib at 110-120.  REVIEW OF SYSTEMS:  CONSTITUTIONAL: No fever, no weakness.  EYES: No blurred or double vision.  EARS, NOSE, AND THROAT: No tinnitus or ear pain.  RESPIRATORY: has cough, shortness of breath, no wheezing or hemoptysis.  CARDIOVASCULAR: No chest pain, orthopnea, has better leg edema .  GASTROINTESTINAL: No nausea, vomiting, diarrhea or abdominal pain.  GENITOURINARY: No dysuria, hematuria.  ENDOCRINE: No polyuria, nocturia,  HEMATOLOGY: No anemia, easy bruising or bleeding SKIN: No rash or lesion. MUSCULOSKELETAL: No joint pain or arthritis.   NEUROLOGIC: No tingling, numbness, weakness.  PSYCHIATRY: No anxiety or depression.   DRUG ALLERGIES:   Allergies  Allergen Reactions  . Ciprofloxacin Nausea And Vomiting  . Hydrochlorothiazide Nausea And Vomiting  . Sulfa Antibiotics Nausea And Vomiting  . Valsartan Nausea And Vomiting    VITALS:  Blood pressure 115/95, pulse 92, temperature 97.9 F (36.6 C), temperature source Oral, resp. rate 18, height 5\' 5"  (1.651 m), weight 57.743 kg (127 lb 4.8 oz), SpO2 96 %.  PHYSICAL EXAMINATION:  GENERAL:  74 y.o.-year-old patient lying in the bed with no acute distress.  EYES: Pupils equal, round, reactive to light and accommodation. No scleral icterus. Extraocular muscles intact.  HEENT: Head atraumatic, normocephalic. Oropharynx and nasopharynx clear.  NECK:  Supple, no jugular venous distention. No thyroid enlargement, no tenderness.  LUNGS: Normal breath sounds bilaterally, no wheezing, rales,rhonchi or crepitation. No use of accessory muscles of respiration.  CARDIOVASCULAR: S1, S2 irregular rythm. No  murmurs, rubs, or gallops.  ABDOMEN: Soft, nontender, nondistended. Bowel sounds present. No organomegaly or mass.  EXTREMITIES: trace leg edema , no cyanosis, or clubbing.  NEUROLOGIC: Cranial nerves II through XII are intact. Muscle strength 5/5 in all extremities. Sensation intact. Gait not checked.  PSYCHIATRIC: The patient is alert and oriented x 3.  SKIN: No obvious rash, lesion, or ulcer.    LABORATORY PANEL:   CBC  Recent Labs Lab 10/18/14 0415  WBC 5.4  HGB 15.2  HCT 45.8  PLT 210   ------------------------------------------------------------------------------------------------------------------  Chemistries   Recent Labs Lab 10/16/14 0708  10/18/14 0415  NA 137  < > 137  K 3.3*  < > 3.8  CL 102  < > 103  CO2 25  < > 25  GLUCOSE 78  < > 100*  BUN 13  < > 15  CREATININE 0.61  < > 0.61  CALCIUM 8.7*  < > 8.7*  MG 1.9  --   --   AST  --   --  41  ALT  --   --  118*  ALKPHOS  --   --  68  BILITOT  --   --  1.1  < > = values in this interval not displayed. ------------------------------------------------------------------------------------------------------------------  Cardiac Enzymes  Recent Labs Lab 10/15/14 0607  TROPONINI 0.18*   ------------------------------------------------------------------------------------------------------------------  RADIOLOGY:  Dg Chest 2 View  10/18/2014   CLINICAL DATA:  Follow-up pleural effusions.  Subsequent encounter.  EXAM: CHEST  2 VIEW  COMPARISON:  Chest radiograph performed 10/14/2014  FINDINGS: Mildly increased small bilateral pleural effusions are seen, left greater than right. Associated mild bibasilar opacities are seen. No  pneumothorax is identified.  The cardiomediastinal silhouette is borderline enlarged. No acute osseous abnormalities are identified. Diffuse calcification is noted along the abdominal aorta.  IMPRESSION: Mildly increased small bilateral pleural effusions, left greater than right. Mild  bibasilar opacities seen. Borderline cardiomegaly. Diffuse calcification along the abdominal aorta.   Electronically Signed   By: Garald Balding M.D.   On: 10/18/2014 07:09    EKG:   Orders placed or performed during the hospital encounter of 10/14/14  . EKG 12-Lead  . EKG 12-Lead  . EKG 12-Lead  . EKG 12-Lead    ASSESSMENT AND PLAN:   A. fib with RVR.  Still Afib with RVR Change amiodarone to 200 mg bid po tomorrow, add lopressor 25 mg po bid and continue digoxin and eliquis per Dr. Cathie Olden.  S/p TEE with cardioversion.   Acute systolic CHF EF 76-16 %.  Continue Lasix 20 mg bid. Bilateral pleural effusions. Due to CHF.  small.   Elevated troponin due to demanding ischemia. Continue eliquis.  Hypokalemia.  Improved after supplement. Hyponatremia. Improved.  Abnormal liver function test. Possible due to CHF.  Better.   PT: no need for home PT.  D/w cardiologist Dr. Cathie Olden. All the records are reviewed and case discussed with Care Management/Social Workerr. Management plans discussed with the patient, family and they are in agreement.  CODE STATUS: Full code.  TOTAL CRITICAL TIME TAKING CARE OF THIS PATIENT: 37  minutes.   POSSIBLE D/C 1-2 DAYS, DEPENDING ON CLINICAL CONDITION.   Demetrios Loll M.D on 10/19/2014 at 11:34 AM  Between 7am to 6pm - Pager - [phone]  After 6pm go to [email or web address] - password [email or web address] [facility]  [place] [facility]  Office  [phone]  CC: Primary care physician; [name], MD

## 2014-10-19 NOTE — Progress Notes (Signed)
PROGRESS NOTE  Subjective:    74 yo with new onset Atrial fib.  Hx of HTN Had TEE / Cardioversion yesterday but it was unsuccessful  HR is still elevated. On amiodarone 400 BID . On eliquis   Objective:    Vital Signs:   Temp:  [97.9 F (36.6 C)-98.2 F (36.8 C)] 97.9 F (36.6 C) (07/20 0800) Pulse Rate:  [65-148] 92 (07/20 0800) Resp:  [18] 18 (07/20 0800) BP: (108-144)/(63-95) 115/95 mmHg (07/20 0800) SpO2:  [93 %-96 %] 96 % (07/20 0800) Weight:  [57.743 kg (127 lb 4.8 oz)] 57.743 kg (127 lb 4.8 oz) (07/20 0503)  Last BM Date: 10/17/14   24-hour weight change: Weight change: -3.765 kg (-8 lb 4.8 oz)  Weight trends: Filed Weights   10/17/14 0633 10/18/14 0533 10/19/14 0503  Weight: 64.4 kg (141 lb 15.6 oz) 61.508 kg (135 lb 9.6 oz) 57.743 kg (127 lb 4.8 oz)    Intake/Output:  07/19 0701 - 07/20 0700 In: 480 [P.O.:480] Out: 3000 [Urine:3000] Total I/O In: -  Out: 200 [Urine:200]   Physical Exam: BP 115/95 mmHg  Pulse 92  Temp(Src) 97.9 F (36.6 C) (Oral)  Resp 18  Ht 5\' 5"  (1.651 m)  Wt 57.743 kg (127 lb 4.8 oz)  BMI 21.18 kg/m2  SpO2 96%  Wt Readings from Last 3 Encounters:  10/19/14 57.743 kg (127 lb 4.8 oz)    General: Vital signs reviewed and noted.   Head: Normocephalic, atraumatic.  Eyes: conjunctivae/corneas clear.  EOM's intact.   Throat: normal  Neck:  normal   Lungs:    clear   Heart:  Irreg. Irreg.   Abdomen:  Soft, non-tender, non-distended    Extremities: No edema    Neurologic: A&O X3, CN II - XII are grossly intact.   Psych: Normal     Labs: BMET:  Recent Labs  10/17/14 0436 10/18/14 0415  NA 137 137  K 3.5 3.8  CL 102 103  CO2 25 25  GLUCOSE 89 100*  BUN 17 15  CREATININE 0.57 0.61  CALCIUM 8.4* 8.7*    Liver function tests:  Recent Labs  10/18/14 0415  AST 41  ALT 118*  ALKPHOS 68  BILITOT 1.1  PROT 5.7*  ALBUMIN 2.8*   No results for input(s): LIPASE, AMYLASE in the last 72  hours.  CBC:  Recent Labs  10/18/14 0415  WBC 5.4  NEUTROABS 3.4  HGB 15.2  HCT 45.8  MCV 91.0  PLT 210    Cardiac Enzymes: No results for input(s): CKTOTAL, CKMB, TROPONINI in the last 72 hours.  Coagulation Studies: No results for input(s): LABPROT, INR in the last 72 hours.  Other: Invalid input(s): POCBNP No results for input(s): DDIMER in the last 72 hours. No results for input(s): HGBA1C in the last 72 hours. No results for input(s): CHOL, HDL, LDLCALC, TRIG, CHOLHDL in the last 72 hours. No results for input(s): TSH, T4TOTAL, T3FREE, THYROIDAB in the last 72 hours.  Invalid input(s): FREET3 No results for input(s): VITAMINB12, FOLATE, FERRITIN, TIBC, IRON, RETICCTPCT in the last 72 hours.   Other results:  Tele   ( personally reviewed ) atrial fib with RVR ,     Medications:    Infusions:    Scheduled Medications: . acidophilus  2 capsule Oral Daily  . amiodarone  400 mg Oral BID  . apixaban  5 mg Oral BID  . digoxin  0.125 mg Oral Daily  . furosemide  20  mg Oral BID  . potassium chloride  10 mEq Oral Daily  . sodium chloride  3 mL Intravenous Q12H    Assessment/ Plan:   Principal Problem:   A-fib Active Problems:   Acute CHF   Elevated troponin   Hyponatremia   Abnormal LFTs  1. Atrial fib:  Cardioversion yesterday was unsuccessful.  She maintained junctional bradycardia for about a minute but then went back into atrial fib.  Will continue amio.  Will add metoprolol for better rate control  Continue Eliquis .   2. Acute on chronic systolic CHF:  May be related to the atrial fib with RVR.  Will start metoprolol  She was intolerant to Valsartan.  We may try another ARB once she is better. Would hesitate to give it now.     Disposition:  Length of Stay: 5  Thayer Headings, Brooke Bonito., MD, [facility] 10/19/2014, 9:14 AM Office [phone] Pager [phone]

## 2014-10-19 NOTE — Telephone Encounter (Signed)
S/w gentleman who answered the home phone. States she is still at [facility]. Will call back once pt is home.

## 2014-10-19 NOTE — Progress Notes (Signed)
Amiodarone Drug - Drug Interaction Consult Note  Recommendations:  Amiodarone is metabolized by the cytochrome P450 system and therefore has the potential to cause many drug interactions. Amiodarone has an average plasma half-life of 50 days (range 20 to 100 days).   There is potential for drug interactions to occur several weeks or months after stopping treatment and the onset of drug interactions may be slow after initiating amiodarone.   []  Statins: Increased risk of myopathy. Simvastatin- restrict dose to 20mg  daily. Other statins: counsel patients to report any muscle pain or weakness immediately.  []  Anticoagulants: Amiodarone can increase anticoagulant effect. Consider warfarin dose reduction. Patients should be monitored closely and the dose of anticoagulant altered accordingly, remembering that amiodarone levels take several weeks to stabilize.  []  Antiepileptics: Amiodarone can increase plasma concentration of phenytoin, the dose should be reduced. Note that small changes in phenytoin dose can result in large changes in levels. Monitor patient and counsel on signs of toxicity.  []  Beta blockers: increased risk of bradycardia, AV block and myocardial depression. Sotalol - avoid concomitant use.  []   Calcium channel blockers (diltiazem and verapamil): increased risk of bradycardia, AV block and myocardial depression.  []   Cyclosporine: Amiodarone increases levels of cyclosporine. Reduced dose of cyclosporine is recommended.  [x]  Digoxin dose should be halved when amiodarone is started. - Recommended to MD that dose be halved from 0.125 mg PO daily to 0.0625 mg PO daily; recommendation accepted by MD  []  Diuretics: increased risk of cardiotoxicity if hypokalemia occurs.  []  Oral hypoglycemic agents (glyburide, glipizide, glimepiride): increased risk of hypoglycemia. Patient's glucose levels should be monitored closely when initiating amiodarone therapy.   []  Drugs that prolong the  QT interval:  Torsades de pointes risk may be increased with concurrent use - avoid if possible.  Monitor QTc, also keep magnesium/potassium WNL if concurrent therapy can't be avoided. Marland Kitchen Antibiotics: e.g. fluoroquinolones, erythromycin. . Antiarrhythmics: e.g. quinidine, procainamide, disopyramide, sotalol. . Antipsychotics: e.g. phenothiazines, haloperidol.  . Lithium, tricyclic antidepressants, and methadone. Thank You,  Rocky Morel  10/19/2014 3:19 PM

## 2014-10-19 NOTE — Care Management (Signed)
S/P TEE/ cardioversion which was not successful.  Patient remains in a fib and heart rate at rest averages in the low 100's.  Physical therapy has no recommendation for further therapy at discharge at present time

## 2014-10-19 NOTE — Progress Notes (Signed)
Patient heart rate dropped to 51, bp normal. Dr. Bridgett Larsson notified, no new orders at this time. Patient Resting comfortably. Wilnette Kales

## 2014-10-19 NOTE — Care Management Important Message (Signed)
Important Message  Patient Details  Name: Karen Bowman MRN: [id number] Date of Birth: [birth year]   Medicare Important Message Given:  Yes-third notification given    Darius Bump Allmond 10/19/2014, 9:49 AM

## 2014-10-20 ENCOUNTER — Telehealth: Payer: Self-pay | Admitting: *Deleted

## 2014-10-20 DIAGNOSIS — I5021 Acute systolic (congestive) heart failure: Secondary | ICD-10-CM

## 2014-10-20 MED ORDER — AMIODARONE HCL 200 MG PO TABS: 200.0000 mg | ORAL_TABLET | Freq: Two times a day (BID) | ORAL | Status: DC

## 2014-10-20 MED ORDER — METOPROLOL TARTRATE 25 MG PO TABS: 12.5000 mg | ORAL_TABLET | Freq: Two times a day (BID) | ORAL | Status: DC

## 2014-10-20 MED ORDER — DIGOXIN 62.5 MCG PO TABS: 0.0625 mg | ORAL_TABLET | Freq: Every day | ORAL | Status: DC

## 2014-10-20 MED ORDER — METOPROLOL TARTRATE 25 MG PO TABS: 25.0000 mg | ORAL_TABLET | Freq: Two times a day (BID) | ORAL | Status: DC

## 2014-10-20 NOTE — H&P (Signed)
A-fib Active Problems:  Acute CHF  Elevated troponin  Hyponatremia  Abnormal LFTs   HPI: 74 y.o. no previous cardiac history 2-3 weeks of malaise, dyspnea and LE edema. Indicates her pulse is always high but admitted with rapid afib. On norvasc for HTN stopped as ? If it was contributing to LE edema. No chest pain. No history of TIA, bleeding CAD or valvular disease. TSH normal. BNP on admission 1018 with elevated LFTls Denies chronic liver issues, hepatitis or GB disease No abdominal pain or history of ETOH abuse. Denies history of hepatitis C or blood transfusion Despite cardizem drip afib rate still high and Rx limited by soft BP.   ROS All other systems reviewed and negative except as noted above  Past Medical History  Diagnosis Date  . Hypertension   . Heart murmur     History reviewed. No pertinent family history.  History   Social History  . Marital Status: Married    Spouse Name: N/A  . Number of Children: N/A  . Years of Education: N/A   Occupational History  . Not on file.   Social History Main Topics  . Smoking status: Never Smoker   . Smokeless tobacco: Not on file  . Alcohol Use: No  . Drug Use: No  . Sexual Activity: Not on file   Other Topics Concern  . Not on file   Social History Narrative  . No narrative on file    Past Surgical History  Procedure Laterality Date  . Abdominal hysterectomy    . Tonsillectomy    . Appendectomy    . Tumor removal      right arm  . Bladder suspension       . acidophilus 2 capsule Oral Daily  . apixaban 5 mg Oral BID  . aspirin EC 81 mg Oral Daily  . digoxin 0.25 mg Intravenous Q6H  . furosemide 20 mg Intravenous Q12H  . sodium chloride 3 mL Intravenous Q12H   . amiodarone 60 mg/hr (10/15/14 1119)   Followed by  . amiodarone   . diltiazem (CARDIZEM)  infusion 5 mg/hr (10/15/14 1007)    Physical Exam: Blood pressure 98/71, pulse 67, temperature 98.8 F (37.1 C), temperature source Oral, resp. rate 18, height 5\' 5"  (1.651 m), weight 167 lb 12.3 oz (76.1 kg), SpO2 98 %.   Affect appropriate Healthy: appears stated age 89: normal Neck supple with no adenopathy JVP elevated no bruits no thyromegaly Lungs basilar ratles no wheezing and good diaphragmatic motion Heart: S1/S2 no murmur, no rub, gallop or click PMI normal Abdomen: benighn, BS positve, no tenderness, no AAA no bruit. No HSM or HJR Distal pulses intact with no bruits Plus one bilateral edema Neuro non-focal Skin warm and dry No muscular weakness   Labs:  Lab Results  Component Value Date   WBC 12.0* 10/15/2014   HGB 14.2 10/15/2014   HCT 43.0 10/15/2014   MCV 90.5 10/15/2014   PLT 212 10/15/2014    Recent Labs Lab 10/15/14 0108  NA 128*  K 4.0  CL 97*  CO2 21*  BUN 21*  CREATININE 0.63  CALCIUM 8.0*  PROT 5.4*  BILITOT 1.2  ALKPHOS 92  ALT 283*  AST 163*  GLUCOSE 100*    Recent Labs    Lab Results  Component Value Date   TROPONINI 0.18* 10/15/2014      Radiology:  Imaging Results    Dg Chest Portable 1 View  10/14/2014 CLINICAL DATA: New onset atrial fibrillation  EXAM: PORTABLE CHEST - 1 VIEW COMPARISON: None. FINDINGS: There is mild bibasilar edema with small pleural effusions and cardiomegaly. There is slight pulmonary venous hypertension. No adenopathy. There is atherosclerotic change in aorta. There is no appreciable airspace consolidation. IMPRESSION: Findings consistent with a degree of congestive heart failure. Electronically Signed By: Lowella Grip III M.D. On: 10/14/2014 14:11     EKG: afib rapid rate normal ST segments  Telemetry : afib rates 120-140 10/15/2014    ASSESSMENT AND PLAN:   Afib: Pt was admitted with atrial fib.  Was  not able to have the TEE cardioversion yesterday . Will proceed with procedure today  .  Discussed risks, benefits, options with her,  She understands and agrees to proceed.      Nielle Duford, Wonda Cheng, MD  10/20/2014 10:21 AM    [address],  [place], [place]  [address] Pager [phone] Phone: [phone]; Fax: [phone]   [facility]  [address] [place], [place]  [address] [phone]   Fax [phone]

## 2014-10-20 NOTE — Discharge Instructions (Signed)
Low sodium and low fat diet. Activity as tolerated.   Heart Failure Clinic appointment at October 31, 2014 at 9:00am with Darylene Price, [place]. Please call [phone] to reschedule.

## 2014-10-20 NOTE — Care Management (Signed)
Patient has had heart rates into the 50's.

## 2014-10-20 NOTE — Progress Notes (Signed)
PROGRESS NOTE  Subjective:    74 yo with new onset Atrial fib.  Hx of HTN Had TEE / Cardioversion 2 days ago which was not successful. However, she is noted to be in sinus rhythm today. She feels very good and denies any chest pain or shortness of breath.     Objective:    Vital Signs:   Temp:  [97.6 F (36.4 C)-98.2 F (36.8 C)] 97.6 F (36.4 C) (07/21 0353) Pulse Rate:  [59-91] 59 (07/21 0353) Resp:  [16-20] 20 (07/21 0353) BP: (109-139)/(57-71) 126/71 mmHg (07/21 0353) SpO2:  [96 %] 96 % (07/21 0353) Weight:  [128 lb 9.6 oz (58.333 kg)] 128 lb 9.6 oz (58.333 kg) (07/21 0500)  Last BM Date: 10/19/14   24-hour weight change: Weight change: 1 lb 4.8 oz (0.59 kg)  Weight trends: Filed Weights   10/18/14 0533 10/19/14 0503 10/20/14 0500  Weight: 135 lb 9.6 oz (61.508 kg) 127 lb 4.8 oz (57.743 kg) 128 lb 9.6 oz (58.333 kg)    Intake/Output:  07/20 0701 - 07/21 0700 In: 720 [P.O.:720] Out: 2675 [Urine:2675]     Physical Exam: BP 126/71 mmHg  Pulse 59  Temp(Src) 97.6 F (36.4 C) (Oral)  Resp 20  Ht 5\' 5"  (1.651 m)  Wt 128 lb 9.6 oz (58.333 kg)  BMI 21.40 kg/m2  SpO2 96%  Wt Readings from Last 3 Encounters:  10/20/14 128 lb 9.6 oz (58.333 kg)    General: Vital signs reviewed and noted.   Head: Normocephalic, atraumatic.  Eyes: conjunctivae/corneas clear.  EOM's intact.   Throat: normal  Neck:  normal   Lungs:    clear   Heart:  Irreg. Irreg.   Abdomen:  Soft, non-tender, non-distended    Extremities: No edema    Neurologic: A&O X3, CN II - XII are grossly intact.   Psych: Normal     Labs: BMET:  Recent Labs  10/18/14 0415  NA 137  K 3.8  CL 103  CO2 25  GLUCOSE 100*  BUN 15  CREATININE 0.61  CALCIUM 8.7*    Liver function tests:  Recent Labs  10/18/14 0415  AST 41  ALT 118*  ALKPHOS 68  BILITOT 1.1  PROT 5.7*  ALBUMIN 2.8*   No results for input(s): LIPASE, AMYLASE in the last 72 hours.  CBC:  Recent Labs  10/18/14 0415  WBC 5.4  NEUTROABS 3.4  HGB 15.2  HCT 45.8  MCV 91.0  PLT 210    Cardiac Enzymes: No results for input(s): CKTOTAL, CKMB, TROPONINI in the last 72 hours.  Coagulation Studies: No results for input(s): LABPROT, INR in the last 72 hours.  Other: Invalid input(s): POCBNP No results for input(s): DDIMER in the last 72 hours. No results for input(s): HGBA1C in the last 72 hours. No results for input(s): CHOL, HDL, LDLCALC, TRIG, CHOLHDL in the last 72 hours. No results for input(s): TSH, T4TOTAL, T3FREE, THYROIDAB in the last 72 hours.  Invalid input(s): FREET3 No results for input(s): VITAMINB12, FOLATE, FERRITIN, TIBC, IRON, RETICCTPCT in the last 72 hours.   Other results:  Tele   ( personally reviewed ) atrial fib with RVR ,     Medications:    Infusions:    Scheduled Medications: . acidophilus  2 capsule Oral Daily  . amiodarone  200 mg Oral BID  . apixaban  5 mg Oral BID  . digoxin  0.0625 mg Oral Daily  . furosemide  20 mg Oral BID  .  metoprolol tartrate  25 mg Oral BID  . potassium chloride  10 mEq Oral Daily  . sodium chloride  3 mL Intravenous Q12H    Assessment/ Plan:   Principal Problem:   A-fib Active Problems:   Acute CHF   Elevated troponin   Hyponatremia   Abnormal LFTs  1. Atrial fib:  She is noted to be in sinus rhythm this morning.  Continue amiodarone 200 mg twice daily, metoprolol 25 mg twice daily and Eliquis 5 mg twice daily. Digoxin can be discontinued. Okay to discharge home from a cardiac standpoint. I will arrange for follow-up in our clinic in 2-3 weeks.  2. Acute on chronic systolic CHF:  May be related to the atrial fib with RVR.  Continue metoprolol 25 mg twice daily. Blood pressure is on the low side and thus would hold off on adding an ACE inhibitor. Continue small dose Lasix 20 mg once daily.      Disposition:  Length of Stay: 6   Karen Sacramento, MD, [facility]

## 2014-10-20 NOTE — Discharge Summary (Signed)
Karen Bowman, is a 74 y.o. female  DOB 1940-09-24  MRN [id number].  Admission date:  10/14/2014  Admitting Physician  Demetrios Loll, MD  Discharge Date:  10/20/2014   Primary MD  [name], MD  Recommendations for primary care physician for things to follow:    Admission Diagnosis  NSTEMI (non-ST elevated myocardial infarction) [I21.4] Atrial fibrillation with rapid ventricular response [I48.91]   Discharge Diagnosis  NSTEMI (non-ST elevated myocardial infarction) [I21.4] Atrial fibrillation with rapid ventricular response [I48.91]    Principal Problem:   A-fib Active Problems:   Acute CHF   Elevated troponin   Hyponatremia   Abnormal LFTs      Past Medical History  Diagnosis Date  . Hypertension   . Heart murmur     Past Surgical History  Procedure Laterality Date  . Abdominal hysterectomy    . Tonsillectomy    . Appendectomy    . Tumor removal      right arm  . Bladder suspension         History of present illness and  Hospital Course:     Kindly see H&P for history of present illness and admission details, please review complete Labs, Consult reports and Test reports for all details in brief  HPI  from the history and physical done on the day of admission   74 year old female female patient admitted because of shortness of breath. Her edema and A. fib with RVR. Admitted to ICU started on Cardizem drip, heparin drip.   Hospital Course  1. Atrial fibrillation with RVR new onset. Patient initially started on Cardizem drip but became hypotensive and so we change the Cardizem to amiodarone drip. Patient seen  By Eppie Gibson . Patient had a TEE and cardioversion on July 19. But the patient the was unsuccessful heart rate still elevated. Patient moved out of intensive care unit and the transfer to  telemetry cardiology is following the patient started on Eliquis, amiodarone. Patient echo showed EF from 35-40%. #2 acute systolic heart failure related patient started on Lasix. Her symptoms of shortness of breath and edema improved. Patient started on metoprolol and the continued on amiodarone. And she is intolerant to wall soft and needs to have another ARB's as an outpatient. #3 hyponatremia and abnormal LFTs or: Seen by gastroenterology Dr. Vira Agar all her symptoms are due to CHF rather than now any GI problem. Did not have any further workup. LFTs improved. 4. non-ST elevation MI secondary to A. fib with RVR and congestive heart failure.  Discharge Condition: [facility]   Follow UP  Follow-up Information    Follow up with Ida Rogue, MD. Go on 12/09/2014.   Specialty:  Cardiology   Why:  Time at 8:20 am.  You are placed on the cancellation list for sooner appointments.   Contact information:   [place] [address] [phone]       Follow up with [name], MD. Go on 11/08/2014.   Specialty:  Family Medicine   Why:  Hospital follow up, Time at 1:15 pm.  If you need to seen sooner please call the office to get in sooner.   Contact information:   [place] [address] [phone]       Follow up with [name], MD In 1 week.   Specialty:  Family Medicine   Contact information:   [address] [place] [address] [phone]  Follow up with Kathlyn Sacramento, MD In 1 week.   Specialty:  Cardiology   Contact information:   [place] [address] [phone]         Discharge Instructions  and  Discharge Medications        Discharge Instructions    AMB referral to CHF clinic    Complete by:  As directed      Diet - low sodium heart healthy    Complete by:  As directed      Increase activity slowly    Complete by:  As directed             Medication List     STOP taking these medications        ALOE VERA PO     AMINO ACID PO     Apple Cider Vinegar Tabs     ASHWAGANDHA PO     BEE POLLEN PO     betamethasone dipropionate 0.05 % cream  Commonly known as:  DIPROLENE     BIOTIN PO     BOSWELLIA PO     CINNAMON PO     EYEBRIGHT HERB PO     GAMMA-ORYZANOL PO     GARLIC PO     GINGER PO     GLUCOSAMINE CHONDR COMPLEX PO     GLUTAMINE PO     HYALURONIC ACID-VITAMIN C PO     MILK THISTLE PO     naproxen 500 MG tablet  Commonly known as:  NAPROSYN     OLIVE LEAF PO     OREGANO PO     OVER THE COUNTER MEDICATION     QUERCETIN PO     ROYAL JELLY PO     RUTIN PO     TURMERIC CURCUMIN PO     TURMERIC PO      TAKE these medications        amiodarone 200 MG tablet  Commonly known as:  PACERONE  Take 1 tablet (200 mg total) by mouth 2 (two) times daily.     amLODipine 2.5 MG tablet  Commonly known as:  NORVASC  Take 2.5 mg by mouth daily.     apixaban 5 MG Tabs tablet  Commonly known as:  ELIQUIS  Take 1 tablet (5 mg total) by mouth 2 (two) times daily.     Cholecalciferol 1000 UNITS tablet  Take 1,000 Units by mouth daily.     CO-Q 10 Omega-3 Fish Oil Caps  Take 1 capsule by mouth 2 (two) times daily.     CRANBERRY PO  Take 1 tablet by mouth daily.     digoxin 0.125 MG tablet  Commonly known as:  LANOXIN  Take 1 tablet (0.125 mg total) by mouth daily.     furosemide 20 MG tablet  Commonly known as:  LASIX  Take 1 tablet (20 mg total) by mouth 2 (two) times daily.     metoprolol tartrate 25 MG tablet  Commonly known as:  LOPRESSOR  Take 0.5 tablets (12.5 mg total) by mouth 2 (two) times daily.     MULTIVITAMIN PO  Take 1 tablet by mouth 3 (three) times daily.     Probiotic Caps  Take 2 capsules by mouth daily.     vitamin C 1000 MG tablet  Take 1,000 mg by mouth daily.          Diet and Activity recommendation: See Discharge Instructions above   Consults obtained -  cardio\ GI   Major procedures and Radiology Reports - PLEASE review detailed and final reports for all details, in brief -   TEE   Dg Chest 2 View  10/18/2014   CLINICAL DATA:  Follow-up pleural effusions.  Subsequent encounter.  EXAM: CHEST  2 VIEW  COMPARISON:  Chest radiograph performed 10/14/2014  FINDINGS: Mildly increased small bilateral pleural effusions are seen, left greater than right. Associated mild bibasilar opacities are seen. No pneumothorax is identified.  The cardiomediastinal silhouette is borderline enlarged. No acute osseous abnormalities are identified. Diffuse calcification is noted along the abdominal aorta.  IMPRESSION: Mildly increased small bilateral pleural effusions, left greater than right. Mild bibasilar opacities seen. Borderline cardiomegaly. Diffuse calcification along the abdominal aorta.   Electronically Signed   By: Garald Balding M.D.   On: 10/18/2014 07:09   US Abdomen Complete  10/15/2014   CLINICAL DATA:  Abnormal liver function tests  EXAM: ULTRASOUND ABDOMEN COMPLETE  COMPARISON:  None.  FINDINGS: Gallbladder: No gallstones. Wall adjacent to the liver is mildly thickened to 5 mm. Free wall appears normal in thickness.  Common bile duct: Diameter: 3.7 mm  Liver: Septated cyst versus 2 contiguous cysts noted in the left lobe, measuring a total of 3.2 cm x 2.8 cm x 3.3 cm. No other liver masses or focal lesions. Normal overall liver parenchymal echogenicity. Normal color Doppler flow in the portal vein and hepatic veins.  IVC: No abnormality visualized.  Pancreas: Visualized portion unremarkable.  Spleen: Size and appearance within normal limits.  Right Kidney: Length: 10.9 cm. Echogenicity within normal limits. No mass or hydronephrosis visualized.  Left Kidney: Length: 11.9 cm. Small midpole cyst measuring 11 mm. No other masses. Normal parenchymal echogenicity. No hydronephrosis.  Abdominal aorta: No aneurysm visualized.  Other findings: Large bilateral pleural  effusions.  IMPRESSION: 1. 3.3 cm septated liver cyst in the left lobe. No other liver abnormality. 2. No acute findings.  No evidence of acute cholecystitis. 3. Small left renal cyst. 4. Large bilateral pleural effusions. 5. No other abnormalities.   Electronically Signed   By: Lajean Manes M.D.   On: 10/15/2014 15:38   Dg Chest Portable 1 View  10/14/2014   CLINICAL DATA:  New onset atrial fibrillation  EXAM: PORTABLE CHEST - 1 VIEW  COMPARISON:  None.  FINDINGS: There is mild bibasilar edema with small pleural effusions and cardiomegaly. There is slight pulmonary venous hypertension. No adenopathy. There is atherosclerotic change in aorta. There is no appreciable airspace consolidation.  IMPRESSION: Findings consistent with a degree of congestive heart failure.   Electronically Signed   By: Lowella Grip III M.D.   On: 10/14/2014 14:11    Micro Results    Recent Results (from the past 240 hour(s))  MRSA PCR Screening     Status: None   Collection Time: 10/14/14  5:39 PM  Result Value Ref Range Status   MRSA by PCR NEGATIVE NEGATIVE Final       Today   Subjective:   Octaviano Glow today has no SOB, no chest pain. No palpitations. Feels great. Objective:   Blood pressure 126/71, pulse 59, temperature 97.6 F (36.4 C), temperature source Oral, resp. rate 20, height 5\' 5"  (1.651 m), weight 58.333 kg (128 lb 9.6 oz), SpO2 96 %.   Intake/Output Summary (Last 24 hours) at 10/20/14 0843 Last data filed at 10/20/14 0350  Gross per 24 hour  Intake    480 ml  Output   2475 ml  Net  -  1995 ml    Exam Awake Alert, Oriented x 3, No new F.N deficits, Normal affect [place].AT,PERRAL Supple Neck,No JVD, No cervical lymphadenopathy appriciated.  Symmetrical Chest wall movement, Good air movement bilaterally, CTAB RRR,No Gallops,Rubs or new Murmurs, No Parasternal Heave +ve B.Sounds, Abd Soft, Non tender, No organomegaly appriciated, No rebound -guarding or rigidity. No Cyanosis,  Clubbing or edema, No new Rash or bruise  Data Review   CBC w Diff:  Lab Results  Component Value Date   WBC 5.4 10/18/2014   HGB 15.2 10/18/2014   HCT 45.8 10/18/2014   PLT 210 10/18/2014   LYMPHOPCT 22 10/18/2014   MONOPCT 11 10/18/2014   EOSPCT 3 10/18/2014   BASOPCT 1 10/18/2014    CMP:  Lab Results  Component Value Date   NA 137 10/18/2014   K 3.8 10/18/2014   CL 103 10/18/2014   CO2 25 10/18/2014   BUN 15 10/18/2014   CREATININE 0.61 10/18/2014   PROT 5.7* 10/18/2014   ALBUMIN 2.8* 10/18/2014   BILITOT 1.1 10/18/2014   ALKPHOS 68 10/18/2014   AST 41 10/18/2014   ALT 118* 10/18/2014  .   Total Time in preparing paper work, data evaluation and todays exam - 19 minutes  Aidynn Krenn M.D on 10/20/2014 at 8:43 AM

## 2014-10-20 NOTE — Plan of Care (Signed)
Problem: Discharge Progression Outcomes Goal: Other Discharge Outcomes/Goals Outcome: Completed/Met Date Met:  10/20/14 Pt is alert and  oriented x 4, denies pain, up to bathroom with stand by assist, nsr on tele, heart rate controlled, on room air, d/c to home, rx provided to pt, pt has schedulded appointments to f/u with pcp, cardiologist, and HF clinic. Pt reports understanding of d/c instructions and has no further questions at this time.

## 2014-10-20 NOTE — Telephone Encounter (Signed)
Transition Care Management Follow-up Telephone Call   Date discharged? 10/20/14   How have you been since you were released from the hospital? Patient reports she is doing well other than some fatigue   Do you understand why you were in the hospital? yes   Do you understand the discharge instructions? yes   Where were you discharged to? Home   Items Reviewed:  Medications reviewed: yes  Allergies reviewed: yes  Dietary changes reviewed: yes, low-sodium  Referrals reviewed: yes, cardiology-scheduled   Functional Questionnaire:   Activities of Daily Living (ADLs):   She states they are independent in the following: ambulation, bathing and hygiene, feeding, continence, grooming, toileting and dressing States they require assistance with the following: None   Any transportation issues/concerns?: no  Any patient concerns? No   Confirmed importance and date/time of follow-up visits scheduled yes, 10/25/14 @ 1315  Provider Appointment booked with [name], MD  Confirmed with patient if condition begins to worsen call PCP or go to the ER.  Patient was given the office number and encouraged to call back with question or concerns.  : yes

## 2014-10-20 NOTE — CV Procedure (Signed)
    Transesophageal Echocardiogram Note  Karen Bowman [id number] Aug 26, 1940  Procedure: Transesophageal Echocardiogram Indications: Atrial fib   Procedure Details Consent: Obtained Time Out: Verified patient identification, verified procedure, site/side was marked, verified correct patient position, special equipment/implants available, Radiology Safety Procedures followed,  medications/allergies/relevent history reviewed, required imaging and test results available.  Performed  Medications: Propofol 120 mg for the TEE and cardioversion   Left Ventrical:  Moderate LV dysfunction, EF ~ 35-40%  Mitral Valve: mild - mod MR  Aortic Valve: normal   Tricuspid Valve: mild TR   Pulmonic Valve:   Left Atrium/ Left atrial appendage: no thrombi,  + spontaneous contrast   Atrial septum: no PFO or ASD by color flow,  Aorta:    Complications: No apparent complications Patient did tolerate procedure well.  We proceeded with cardioversion at this point   Cardioversion Note  Karen Bowman [id number] [birth year]  Procedure: DC Cardioversion Indications: atrial fib   Procedure Details Consent: Obtained Time Out: Verified patient identification, verified procedure, site/side was marked, verified correct patient position, special equipment/implants available, Radiology Safety Procedures followed,  medications/allergies/relevent history reviewed, required imaging and test results available.  Performed  The patient has been on adequate anticoagulation.  The patient received IV Propofol  for sedation.  Synchronous cardioversion was performed at 120, 120, 200  joules.  The cardioversion was unsuccessful     Complications: No apparent complications Patient did tolerate procedure well.   Thayer Headings, Brooke Bonito., MD, [facility] 10/20/2014, 10:26 AM

## 2014-10-21 ENCOUNTER — Other Ambulatory Visit: Payer: Self-pay

## 2014-10-21 MED ORDER — APIXABAN 5 MG PO TABS: 5.0000 mg | ORAL_TABLET | Freq: Two times a day (BID) | ORAL | Status: DC

## 2014-10-21 MED ORDER — FUROSEMIDE 20 MG PO TABS: 20.0000 mg | ORAL_TABLET | Freq: Two times a day (BID) | ORAL | Status: DC

## 2014-10-21 NOTE — Telephone Encounter (Signed)
S/w pt husband and notified them that prescriptions resent to [facility] in [place] spouse verbalized understanding with no further questions.

## 2014-10-21 NOTE — Telephone Encounter (Signed)
Ok to refill 

## 2014-10-21 NOTE — Telephone Encounter (Signed)
Patient contacted regarding discharge from [facility] on 7/21.  Patient understands to follow up with provider Gollan on 9/9 at 8:20am at [facility] Patient understands discharge instructions? yes Patient understands medications and regiment? yes Patient understands to bring all medications to this visit? yes  Pt states she does not have prescription for eliquis or lasix.  Uses [facility] [place]. I called pharmacy and spoke with Vicente Males who states the prescription never got to them.  Noted in chart eliquis 5mg  BID; lasix 20mg  BID  Will resubmit to pharmacy

## 2014-10-24 ENCOUNTER — Encounter: Payer: Self-pay | Admitting: Emergency Medicine

## 2014-10-25 ENCOUNTER — Encounter: Payer: Self-pay | Admitting: Family Medicine

## 2014-10-25 ENCOUNTER — Ambulatory Visit (INDEPENDENT_AMBULATORY_CARE_PROVIDER_SITE_OTHER): Payer: Medicare PPO | Admitting: Family Medicine

## 2014-10-25 VITALS — BP 108/71 | HR 105 | Temp 98.5°F | Ht 65.0 in | Wt 127.2 lb

## 2014-10-25 DIAGNOSIS — E871 Hypo-osmolality and hyponatremia: Secondary | ICD-10-CM

## 2014-10-25 DIAGNOSIS — R7989 Other specified abnormal findings of blood chemistry: Secondary | ICD-10-CM

## 2014-10-25 DIAGNOSIS — I10 Essential (primary) hypertension: Secondary | ICD-10-CM

## 2014-10-25 DIAGNOSIS — I5021 Acute systolic (congestive) heart failure: Secondary | ICD-10-CM

## 2014-10-25 DIAGNOSIS — I4891 Unspecified atrial fibrillation: Secondary | ICD-10-CM

## 2014-10-25 DIAGNOSIS — E782 Mixed hyperlipidemia: Secondary | ICD-10-CM

## 2014-10-25 DIAGNOSIS — R945 Abnormal results of liver function studies: Secondary | ICD-10-CM

## 2014-10-25 LAB — COMPREHENSIVE METABOLIC PANEL
ALBUMIN: 4 g/dL (ref 3.5–5.2)
ALT: 54 U/L — ABNORMAL HIGH (ref 0–35)
AST: 33 U/L (ref 0–37)
Alkaline Phosphatase: 78 U/L (ref 39–117)
BUN: 20 mg/dL (ref 6–23)
CO2: 30 meq/L (ref 19–32)
Calcium: 9.8 mg/dL (ref 8.4–10.5)
Chloride: 101 mEq/L (ref 96–112)
Creatinine, Ser: 0.87 mg/dL (ref 0.40–1.20)
GFR: 68.02 mL/min (ref >60.00)
Glucose, Bld: 100 mg/dL — ABNORMAL HIGH (ref 70–99)
Potassium: 4 mEq/L (ref 3.5–5.1)
SODIUM: 139 meq/L (ref 135–145)
Total Bilirubin: 0.5 mg/dL (ref 0.2–1.2)
Total Protein: 7 g/dL (ref 6.0–8.3)

## 2014-10-25 NOTE — Assessment & Plan Note (Signed)
Almost euvolemic on lasix.. Taking BID.Marland Kitchen Causing insomnia. Encouraged her to take dose earlier in day.  ON BBlocker, diuretic and digoxin.Marland Kitchen Needs ACEI  ( SE to valsartan) If indicated for optimal care. Will await cards recs.

## 2014-10-25 NOTE — Assessment & Plan Note (Addendum)
Rate fairly well controlled on digoxin, amlodipine and metoprolol. SE to ARB, but cards will likely consider ACEI.  On eliquis with no SE for anticoagulation.

## 2014-10-25 NOTE — Assessment & Plan Note (Signed)
Due to CHF, resolved at discharge.

## 2014-10-25 NOTE — Assessment & Plan Note (Signed)
BP Readings from Last 3 Encounters:  10/25/14 108/71  10/18/14 127/90  10/20/14 126/71  BP well controlled on current regimen.

## 2014-10-25 NOTE — Progress Notes (Signed)
Pre visit review using our clinic review tool, if applicable. No additional management support is needed unless otherwise documented below in the visit note. 

## 2014-10-25 NOTE — Patient Instructions (Addendum)
Change lasix to  1 tab in AM and one in early afternoon to try to help with frequent urination through the night causing insomnia. Not twice daily.  Stop at lab on way out. Consider trying red yeast rice 2 tabs 600 mg each twice daily for cholesterol.  Keep appt with cardiology.

## 2014-10-25 NOTE — Progress Notes (Signed)
Subjective:    Patient ID: Karen Bowman, female    DOB: 10-26-1940, 74 y.o.   MRN: [id number]  HPI   74 year old female presents for hospital follow up.  She was contacted on  7/21 for transitional care.  Admitted to [facility] on 10/14/2014  Discharged on 10/20/2014  Admission Diagnosis NSTEMI (non-ST elevated myocardial infarction) [I21.4] Atrial fibrillation with rapid ventricular response [I48.91]  Hospital Course  1. Atrial fibrillation with RVR new onset. Patient initially started on Cardizem drip but became hypotensive and so we change the Cardizem to amiodarone drip. Patient seen By Eppie Gibson . Patient had a TEE and cardioversion on July 19. But the patient the was unsuccessful heart rate still elevated. Patient moved out of intensive care unit and the transfer to telemetry cardiology is following the patient started on Eliquis, amiodarone. Patient echo showed EF from 35-40%. #2 acute systolic heart failure related patient started on Lasix. Her symptoms of shortness of breath and edema improved. Patient started on metoprolol and the continued on amiodarone. She is intolerant to  valsartan and needs to have another ARB's as an outpatient. #3 hyponatremia and abnormal LFTs or: Seen by gastroenterology Dr. Vira Agar all her symptoms are due to CHF rather than now any GI problem. Did not have any further workup. LFTs improved. 4. non-ST elevation MI secondary to A. fib with RVR and congestive heart failure.  She has follow up with Dr. Rockey Situ on  12/09/2014. Per D/C note she was supposed to follow up with cards in 1 week from discharge.  Last lipid panel in 12/2013 LDL elevated at 206 She has not tolerated red yeast rice well. Not interested in statin meds in past. She continues to refuse meds and wishes to treat with   Today: She remains tired since discharge. Not sleeping well given going to bathroom a lot from fluid pills. Taking furosemide 20 mg 2 times daily. LAst dose at  8PM! She has continued to have decrease edema. No palpitations.No bleeding.  No lightheadedness.  She has been eating well.   Wt Readings from Last 3 Encounters:  10/25/14 127 lb 4 oz (57.72 kg)  10/20/14 128 lb 9.6 oz (58.333 kg)  03/11/14 142 lb 8 oz (64.638 kg)         Review of Systems  Constitutional: Positive for fatigue. Negative for fever.  HENT: Negative for ear pain.   Eyes: Negative for pain.  Respiratory: Negative for chest tightness and shortness of breath.   Cardiovascular: Negative for chest pain, palpitations and leg swelling.  Gastrointestinal: Negative for abdominal pain.  Genitourinary: Negative for dysuria.       Objective:   Physical Exam  Constitutional: Vital signs are normal. She appears well-developed and well-nourished. She is cooperative.  Non-toxic appearance. She does not appear ill. No distress.  HENT:  Head: Normocephalic.  Right Ear: Hearing, tympanic membrane, external ear and ear canal normal. Tympanic membrane is not erythematous, not retracted and not bulging.  Left Ear: Hearing, tympanic membrane, external ear and ear canal normal. Tympanic membrane is not erythematous, not retracted and not bulging.  Nose: No mucosal edema or rhinorrhea. Right sinus exhibits no maxillary sinus tenderness and no frontal sinus tenderness. Left sinus exhibits no maxillary sinus tenderness and no frontal sinus tenderness.  Mouth/Throat: Uvula is midline, oropharynx is clear and moist and mucous membranes are normal.  Eyes: Conjunctivae, EOM and lids are normal. Pupils are equal, round, and reactive to light. Lids are everted and swept,  no foreign bodies found. Right eye exhibits no discharge. Left eye exhibits no discharge.  Neck: Trachea normal and normal range of motion. Neck supple. Carotid bruit is not present. No thyroid mass and no thyromegaly present.  Cardiovascular: S1 normal, S2 normal, intact distal pulses and normal pulses.  An irregularly  irregular rhythm present. Tachycardia present.  Exam reveals distant heart sounds. Exam reveals no gallop and no friction rub.   No murmur heard. Varicose veins bilaterally.  Bilateral edema mild nonpitting   Pulmonary/Chest: Effort normal and breath sounds normal. No tachypnea. No respiratory distress. She has no decreased breath sounds. She has no wheezes. She has no rhonchi. She has no rales.  Abdominal: Soft. Normal appearance and bowel sounds are normal. There is no hepatosplenomegaly. There is no tenderness. There is no CVA tenderness.  Neurological: She is alert.  Skin: Skin is warm, dry and intact. No rash noted.  Psychiatric: Her speech is normal and behavior is normal. Judgment and thought content normal. Her mood appears not anxious. Cognition and memory are normal. She does not exhibit a depressed mood.          Assessment & Plan:

## 2014-10-25 NOTE — Assessment & Plan Note (Signed)
Poor control last check.  Pt refuses statin will cosdier retrying red yeast rice. Recheck in 3 months. Encouraged exercise, weight loss, healthy eating habits.

## 2014-10-26 ENCOUNTER — Encounter: Payer: Self-pay | Admitting: *Deleted

## 2014-10-26 ENCOUNTER — Encounter: Payer: Self-pay | Admitting: Family Medicine

## 2014-10-31 ENCOUNTER — Encounter: Payer: Self-pay | Admitting: Family

## 2014-10-31 ENCOUNTER — Ambulatory Visit: Payer: Medicare PPO | Attending: Family | Admitting: Family

## 2014-10-31 VITALS — BP 156/50 | HR 59 | Resp 20 | Ht 65.0 in | Wt 129.0 lb

## 2014-10-31 DIAGNOSIS — I4891 Unspecified atrial fibrillation: Secondary | ICD-10-CM | POA: Diagnosis not present

## 2014-10-31 DIAGNOSIS — Z79899 Other long term (current) drug therapy: Secondary | ICD-10-CM | POA: Insufficient documentation

## 2014-10-31 DIAGNOSIS — I10 Essential (primary) hypertension: Secondary | ICD-10-CM | POA: Diagnosis not present

## 2014-10-31 DIAGNOSIS — I5022 Chronic systolic (congestive) heart failure: Secondary | ICD-10-CM | POA: Diagnosis not present

## 2014-10-31 DIAGNOSIS — R011 Cardiac murmur, unspecified: Secondary | ICD-10-CM | POA: Diagnosis not present

## 2014-10-31 DIAGNOSIS — I480 Paroxysmal atrial fibrillation: Secondary | ICD-10-CM

## 2014-10-31 NOTE — Patient Instructions (Signed)
Continue weighing daily and call for an overnight weight gain of > 2 pounds or a weekly weight gain of >5 pounds.  Low-Sodium Eating Plan Sodium raises blood pressure and causes water to be held in the body. Getting less sodium from food will help lower your blood pressure, reduce any swelling, and protect your heart, liver, and kidneys. We get sodium by adding salt (sodium chloride) to food. Most of our sodium comes from canned, boxed, and frozen foods. Restaurant foods, fast foods, and pizza are also very high in sodium. Even if you take medicine to lower your blood pressure or to reduce fluid in your body, getting less sodium from your food is important. WHAT IS MY PLAN? Most people should limit their sodium intake to 2,300 mg a day. Your health care provider recommends that you limit your sodium intake to __2000mg__ a day.  WHAT DO I NEED TO KNOW ABOUT THIS EATING PLAN? For the low-sodium eating plan, you will follow these general guidelines:  Choose foods with a % Daily Value for sodium of less than 5% (as listed on the food label).   Use salt-free seasonings or herbs instead of table salt or sea salt.   Check with your health care provider or pharmacist before using salt substitutes.   Eat fresh foods.  Eat more vegetables and fruits.  Limit canned vegetables. If you do use them, rinse them well to decrease the sodium.   Limit cheese to 1 oz (28 g) per day.   Eat lower-sodium products, often labeled as "lower sodium" or "no salt added."  Avoid foods that contain monosodium glutamate (MSG). MSG is sometimes added to Chinese food and some canned foods.  Check food labels (Nutrition Facts labels) on foods to learn how much sodium is in one serving.  Eat more home-cooked food and less restaurant, buffet, and fast food.  When eating at a restaurant, ask that your food be prepared with less salt or none, if possible.  HOW DO I READ FOOD LABELS FOR SODIUM INFORMATION? The  Nutrition Facts label lists the amount of sodium in one serving of the food. If you eat more than one serving, you must multiply the listed amount of sodium by the number of servings. Food labels may also identify foods as:  Sodium free--Less than 5 mg in a serving.  Very low sodium--35 mg or less in a serving.  Low sodium--140 mg or less in a serving.  Light in sodium--50% less sodium in a serving. For example, if a food that usually has 300 mg of sodium is changed to become light in sodium, it will have 150 mg of sodium.  Reduced sodium--25% less sodium in a serving. For example, if a food that usually has 400 mg of sodium is changed to reduced sodium, it will have 300 mg of sodium. WHAT FOODS CAN I EAT? Grains Low-sodium cereals, including oats, puffed wheat and rice, and shredded wheat cereals. Low-sodium crackers. Unsalted rice and pasta. Lower-sodium bread.  Vegetables Frozen or fresh vegetables. Low-sodium or reduced-sodium canned vegetables. Low-sodium or reduced-sodium tomato sauce and paste. Low-sodium or reduced-sodium tomato and vegetable juices.  Fruits Fresh, frozen, and canned fruit. Fruit juice.  Meat and Other Protein Products Low-sodium canned tuna and salmon. Fresh or frozen meat, poultry, seafood, and fish. Lamb. Unsalted nuts. Dried beans, peas, and lentils without added salt. Unsalted canned beans. Homemade soups without salt. Eggs.  Dairy Milk. Soy milk. Ricotta cheese. Low-sodium or reduced-sodium cheeses. Yogurt.  Condiments Fresh   and dried herbs and spices. Salt-free seasonings. Onion and garlic powders. Low-sodium varieties of mustard and ketchup. Lemon juice.  Fats and Oils Reduced-sodium salad dressings. Unsalted butter.  Other Unsalted popcorn and pretzels.  The items listed above may not be a complete list of recommended foods or beverages. Contact your dietitian for more options. WHAT FOODS ARE NOT RECOMMENDED? Grains Instant hot cereals.  Bread stuffing, pancake, and biscuit mixes. Croutons. Seasoned rice or pasta mixes. Noodle soup cups. Boxed or frozen macaroni and cheese. Self-rising flour. Regular salted crackers. Vegetables Regular canned vegetables. Regular canned tomato sauce and paste. Regular tomato and vegetable juices. Frozen vegetables in sauces. Salted french fries. Olives. Pickles. Relishes. Sauerkraut. Salsa. Meat and Other Protein Products Salted, canned, smoked, spiced, or pickled meats, seafood, or fish. Bacon, ham, sausage, hot dogs, corned beef, chipped beef, and packaged luncheon meats. Salt pork. Jerky. Pickled herring. Anchovies, regular canned tuna, and sardines. Salted nuts. Dairy Processed cheese and cheese spreads. Cheese curds. Blue cheese and cottage cheese. Buttermilk.  Condiments Onion and garlic salt, seasoned salt, table salt, and sea salt. Canned and packaged gravies. Worcestershire sauce. Tartar sauce. Barbecue sauce. Teriyaki sauce. Soy sauce, including reduced sodium. Steak sauce. Fish sauce. Oyster sauce. Cocktail sauce. Horseradish. Regular ketchup and mustard. Meat flavorings and tenderizers. Bouillon cubes. Hot sauce. Tabasco sauce. Marinades. Taco seasonings. Relishes. Fats and Oils Regular salad dressings. Salted butter. Margarine. Ghee. Bacon fat.  Other Potato and tortilla chips. Corn chips and puffs. Salted popcorn and pretzels. Canned or dried soups. Pizza. Frozen entrees and pot pies.  The items listed above may not be a complete list of foods and beverages to avoid. Contact your dietitian for more information. Document Released: 09/07/2001 Document Revised: 03/23/2013 Document Reviewed: 01/20/2013 Exit[employer] Patient Information 2015 [employer]. This information is not intended to replace advice given to you by your health care provider. Make sure you discuss any questions you have with your health care provider.  

## 2014-10-31 NOTE — Progress Notes (Signed)
Subjective:    Patient ID: Karen Bowman, female    DOB: 01-16-1941, 74 y.o.   MRN: [id number]  Congestive Heart Failure Presents for initial visit. The disease course has been improving. Associated symptoms include edema (this morning in right ankle), fatigue and palpitations. Pertinent negatives include no abdominal pain, chest pain, orthopnea or shortness of breath. The symptoms have been improving. Past treatments include beta blockers, digoxin and salt and fluid restriction. The treatment provided significant relief. Compliance with prior treatments has been good. Her past medical history is significant for HTN. Compliance with total regimen is 76-100%.  Atrial Fibrillation Presents for initial visit. Symptoms include hypertension and palpitations. Symptoms are negative for chest pain, dizziness, shortness of breath and weakness. The symptoms have been stable. Past treatments include anticoagulant, beta blockers and antiarrhythmics. Compliance with prior treatments has been good. Past medical history includes atrial fibrillation, CHF and HTN.   Past Medical History  Diagnosis Date  . Hypertension   . Heart murmur     Past Surgical History  Procedure Laterality Date  . Tonsillectomy    . Appendectomy    . Tumor removal      right arm  . Bladder suspension    . Abdominal hysterectomy     Family History  Problem Relation Age of Onset  . Osteoporosis Mother   . Heart disease Mother   . Cancer Father   . Diabetes Maternal Aunt     History  Substance Use Topics  . Smoking status: Never Smoker   . Smokeless tobacco: Never Used  . Alcohol Use: No    Allergies  Allergen Reactions  . Ciprofloxacin     REACTION: dizzy  . Ciprofloxacin Nausea And Vomiting  . Hydrochlorothiazide Nausea And Vomiting  . Hydrochlorothiazide W-Triamterene     REACTION: nausea  . Sulfa Antibiotics Nausea And Vomiting  . Sulfonamide Derivatives     REACTION: nausea \\T \ vomiting  . Valsartan      REACTION: angioedema - probable  . Valsartan Nausea And Vomiting    Prior to Admission medications   Medication Sig Start Date End Date Taking? Authorizing Provider  amiodarone (PACERONE) 200 MG tablet Take 1 tablet (200 mg total) by mouth 2 (two) times daily. 10/20/14  Yes Epifanio Lesches, MD  apixaban (ELIQUIS) 5 MG TABS tablet Take 1 tablet (5 mg total) by mouth 2 (two) times daily. 10/21/14  Yes Minna Merritts, MD  APPLE CIDER VINEGAR PO Take 2-4 tablets by mouth daily.   Yes Historical Provider, MD  Ascorbic Acid (VITAMIN C) 1000 MG tablet Take 1,000 mg by mouth daily.    Yes Historical Provider, MD  betamethasone dipropionate (DIPROLENE) 0.05 % cream Apply topically 2 (two) times daily. 02/01/14  Yes Amy Cletis [place], MD  Cholecalciferol 1000 UNITS capsule Take 2,000 Units by mouth daily.    Yes Historical Provider, MD  Coenzyme Q10-Fish Oil-Vit E (CO-Q 10 OMEGA-3 FISH OIL) CAPS Take 1 capsule by mouth 2 (two) times daily.   Yes Historical Provider, MD  CRANBERRY PO Take 1 tablet by mouth daily.   Yes Historical Provider, MD  digoxin (LANOXIN) 0.125 MG tablet Take 1 tablet (0.125 mg total) by mouth daily. 10/19/14  Yes Demetrios Loll, MD  EYEBRIGHT PO Take 1 tablet by mouth daily.   Yes Historical Provider, MD  furosemide (LASIX) 20 MG tablet Take 1 tablet (20 mg total) by mouth 2 (two) times daily. 10/21/14  Yes Minna Merritts, MD  Hyaluronic Acid-Vitamin C (HYALURONIC  ACID PO) Take 2 tablets by mouth daily.   Yes Historical Provider, MD  L-GLUTAMINE PO Take 1 tablet by mouth daily.   Yes Historical Provider, MD  metoprolol tartrate (LOPRESSOR) 25 MG tablet Take 0.5 tablets (12.5 mg total) by mouth 2 (two) times daily. 10/20/14  Yes Epifanio Lesches, MD  MILK THISTLE PO Take 2 tablets by mouth daily.   Yes Historical Provider, MD  MISC NATURAL PRODUCTS PO Take 4 tablets by mouth daily. Advanced BP Support-Dr.Sinatra ([place]'s #1 integrated cardiologist)   Yes Historical Provider, MD   MISC NATURAL PRODUCTS PO Take 4 tablets by mouth daily. Multivitamin-Dr. Talmage Coin   Yes Historical Provider, MD  MISC NATURAL PRODUCTS PO Take 4 tablets by mouth daily. Lindsay   Yes Historical Provider, MD  MISC NATURAL PRODUCTS PO Take 2 tablets by mouth daily. Cordyceps   Yes Historical Provider, MD  MISC NATURAL PRODUCTS PO Take 1 tablet by mouth daily. Ashwagandha   Yes Historical Provider, MD  MISC NATURAL PRODUCTS PO Take 5 tablets by mouth daily. Bone-Up by Gus Height   Yes Historical Provider, MD  MISC NATURAL PRODUCTS PO Advanced Glucosamine Chondroitin-MSM Natures Plus Ultra Rx Joint Liquid 1/2 capful per day   Yes Historical Provider, MD  MISC NATURAL PRODUCTS PO Take 1 tablet by mouth daily. Curcumin   Yes Historical Provider, MD  Multiple Vitamins-Minerals (MULTIVITAMIN PO) Take 1 tablet by mouth 3 (three) times daily.   Yes Historical Provider, MD  naproxen (NAPROSYN) 500 MG tablet Take 1 tablet (500 mg total) by mouth 2 (two) times daily as needed. 02/01/14  Yes Amy Cletis [place], MD  OVER THE COUNTER MEDICATION Take 2 tablets by mouth daily. Omega Q Plus w/resveratrol-Dr. Talmage Coin   Yes Historical Provider, MD  Probiotic CAPS Take 2 capsules by mouth daily.   Yes Historical Provider, MD  Probiotic Product (PROBIOTIC DAILY PO) Dr. Gertie Exon Probiotics one tablet daily   Yes Historical Provider, MD  QUERCETIN PO Take 1-2 tablets by mouth daily. w/bromelain   Yes Historical Provider, MD  ROYAL JELLY PO Take 1 tablet by mouth daily.   Yes Historical Provider, MD  RUTIN PO Take 2 tablets by mouth daily.   Yes Historical Provider, MD  ALOE VERA PO Take 1 tablet by mouth daily.    Historical Provider, MD  Amino Acids (L-CARNITINE) LIQD 1/4 teaspoon liquid per day    Historical Provider, MD  BEE POLLEN PO Take 2 tablets by mouth daily.     Historical Provider, MD  BIOTIN PO Take 1 tablet by mouth daily.    Historical Provider, MD  CINNAMON PO Take 2 tablets by mouth daily.      Historical Provider, MD  Misc Natural Products (BLACK CHERRY CONCENTRATE PO) Take 2 tablets by mouth daily.    Historical Provider, MD  MISC NATURAL PRODUCTS PO Take 1 tablet by mouth daily. Cholesterol Solutions-Dr. Venetia Maxon    Historical Provider, MD  MISC NATURAL PRODUCTS PO Take 2 tablets by mouth daily. Triveratrol Plus(resveratrol w/other items) Dr. Venetia Maxon    Historical Provider, MD  MISC NATURAL PRODUCTS PO Take 1 tablet by mouth daily. Garlic & Ginger    Historical Provider, MD  MISC NATURAL PRODUCTS PO Take 1 tablet by mouth daily. [place] Seed (1000)    Historical Provider, MD  MISC NATURAL PRODUCTS PO Magnesium Citrate 1/2 teaspoon per day    Historical Provider, MD  MISC NATURAL PRODUCTS PO Take 1 tablet by mouth daily. Gamma E Complex    Historical  Provider, MD  MISC NATURAL PRODUCTS PO Take 2-3 tablets by mouth daily. Tumeric    Historical Provider, MD  MISC NATURAL PRODUCTS PO Take 1 tablet by mouth daily. Boswellia (w/devil's claw)    Historical Provider, MD  OIL OF OREGANO PO Take 1 tablet by mouth daily.    Historical Provider, MD  OLIVE LEAF PO Take 1 tablet by mouth daily.    Historical Provider, MD      Review of Systems  Constitutional: Positive for fatigue. Negative for appetite change.  HENT: Negative for congestion, postnasal drip and sore throat.   Eyes: Negative.   Respiratory: Negative for cough, chest tightness and shortness of breath.   Cardiovascular: Positive for palpitations. Negative for chest pain and leg swelling.  Gastrointestinal: Negative for abdominal pain and abdominal distention.  Endocrine: Negative.   Genitourinary: Negative.   Musculoskeletal: Negative for back pain and neck pain.  Skin: Negative.   Allergic/Immunologic: Negative.   Neurological: Negative for dizziness, weakness and light-headedness.  Hematological: Negative for adenopathy. Does not bruise/bleed easily.  Psychiatric/Behavioral: Negative for sleep disturbance (sleeping on 2  pillows) and dysphoric mood. The patient is not nervous/anxious.        Objective:   Physical Exam  Constitutional: She is oriented to person, place, and time. She appears well-developed and well-nourished.  HENT:  Head: Normocephalic and atraumatic.  Eyes: Conjunctivae are normal. Pupils are equal, round, and reactive to light.  Neck: Normal range of motion. Neck supple.  Cardiovascular: An irregular rhythm present. Bradycardia present.   Pulmonary/Chest: Effort normal. She has no wheezes. She has no rales.  Abdominal: Soft. She exhibits no distension. There is no tenderness.  Musculoskeletal: She exhibits edema (trace amount in right ankle). She exhibits no tenderness.  Neurological: She is alert and oriented to person, place, and time.  Skin: Skin is warm and dry.  Psychiatric: She has a normal mood and affect. Her behavior is normal. Thought content normal.  Nursing note and vitals reviewed.   BP 156/50 mmHg  Pulse 59  Resp 20  Ht 5\' 5"  (1.651 m)  Wt 129 lb (58.514 kg)  BMI 21.47 kg/m2  SpO2 99%  LMP  (LMP Unknown)       Assessment & Plan:  1: Chronic heart failure with reduced ejection fraction- Patient presents stating that her symptoms have improved quite a bit since hospital discharge and that she is feeling well. Still gets some fatigue but is learning to rest often in between activities. She says that yesterday was the first day that she did not lay down and rest. She does notice some swelling in her right ankle this morning but, again, she was on her feet yesterday and hasn't taken her diuretic yet today. She is planning on taking her furosemide today once she gets home. She is already weighing herself daily and says that her weight has been stable. Reminded to call for an overnight weight gain of >2 pounds or a weekly weight gain of >5 pounds. She is not adding salt to her food and uses Mrs. Dash as well as other herbs. Did discuss a 2000mg  sodium diet and how to read  food labels. Written information was provided along with giving her a low sodium cookbook. Patient has a documented allergy to valsartan (possible angioedema) so will defer possible ACE-i use to cardiologist. 2: HTN- Blood pressure slightly elevated today but, again, she hasn't taken her diuretic yet this morning.  3: Atrial fibrillation- Currently rate controlled. Is taking digoxin,  metoprolol, amiodarone and eliquis. Following closely with cardiology.  Return in 1 month or sooner for any questions/problems before then.

## 2014-11-08 ENCOUNTER — Ambulatory Visit: Payer: Medicare PPO | Admitting: Family Medicine

## 2014-11-11 ENCOUNTER — Telehealth: Payer: Self-pay | Admitting: Cardiovascular Disease

## 2014-11-11 NOTE — Telephone Encounter (Signed)
°  1. Which medications need to be refilled? Pacerone 200 mg po 2 x daily; digoxin 0.125 mg po daily ; lasix 20 mg po 2 x daily ; Eliquis 5 mg po 2 x daily     2. Which pharmacy is medication to be sent to? [facility]  3. Do they need a 30 day or 90 day supply? 90   4. Would they like a call back once the medication has been sent to the pharmacy?  If problems call

## 2014-11-14 NOTE — Telephone Encounter (Signed)
Forward to Arida 

## 2014-11-14 NOTE — Telephone Encounter (Signed)
Give 1 month refill only

## 2014-11-14 NOTE — Telephone Encounter (Signed)
Pt requesting Rx for Amiodarone, Digoxin, Eliquis and lasix. Pt has not been seen in office only hospital. Pt has appointment with Dr. Rockey Situ 12/09/2014. Please advise if ok to refill.

## 2014-11-15 ENCOUNTER — Other Ambulatory Visit: Payer: Self-pay

## 2014-11-15 MED ORDER — FUROSEMIDE 20 MG PO TABS: 20.0000 mg | ORAL_TABLET | Freq: Two times a day (BID) | ORAL | Status: DC

## 2014-11-15 MED ORDER — APIXABAN 5 MG PO TABS: 5.0000 mg | ORAL_TABLET | Freq: Two times a day (BID) | ORAL | Status: DC

## 2014-11-15 MED ORDER — AMIODARONE HCL 200 MG PO TABS: 200.0000 mg | ORAL_TABLET | Freq: Two times a day (BID) | ORAL | Status: DC

## 2014-11-15 MED ORDER — DIGOXIN 125 MCG PO TABS: 0.1250 mg | ORAL_TABLET | Freq: Every day | ORAL | Status: DC

## 2014-11-15 NOTE — Telephone Encounter (Signed)
S/w pt regarding refills. Will send in one month to [facility] in [place] asking to change appt time to 9/9 at 2:40pm Pt states this time will be better and verbalized understanding on new appt time. Pt had no further questions.

## 2014-12-02 ENCOUNTER — Telehealth: Payer: Self-pay | Admitting: Family Medicine

## 2014-12-02 DIAGNOSIS — R32 Unspecified urinary incontinence: Secondary | ICD-10-CM

## 2014-12-02 NOTE — Telephone Encounter (Signed)
Referral sent 

## 2014-12-02 NOTE — Telephone Encounter (Signed)
Pt would like referral to urology for dropped bladder best cb number is [phone]

## 2014-12-02 NOTE — Telephone Encounter (Signed)
Ms. Raney notified Urology Referral has been ordered.  She would like to be referred to someone in [place].  Advised Rosaria Ferries or Ebony Hail would be calling her with that appointment once they get it scheduled for her.

## 2014-12-09 ENCOUNTER — Ambulatory Visit (INDEPENDENT_AMBULATORY_CARE_PROVIDER_SITE_OTHER): Payer: Medicare PPO | Admitting: Cardiovascular Disease

## 2014-12-09 ENCOUNTER — Encounter: Payer: Self-pay | Admitting: Cardiovascular Disease

## 2014-12-09 ENCOUNTER — Encounter (INDEPENDENT_AMBULATORY_CARE_PROVIDER_SITE_OTHER): Payer: Self-pay

## 2014-12-09 ENCOUNTER — Encounter: Payer: Medicare PPO | Admitting: Cardiovascular Disease

## 2014-12-09 VITALS — BP 128/70 | HR 32 | Ht 65.0 in | Wt 130.8 lb

## 2014-12-09 DIAGNOSIS — I498 Other specified cardiac arrhythmias: Secondary | ICD-10-CM

## 2014-12-09 DIAGNOSIS — I480 Paroxysmal atrial fibrillation: Secondary | ICD-10-CM | POA: Diagnosis not present

## 2014-12-09 DIAGNOSIS — E782 Mixed hyperlipidemia: Secondary | ICD-10-CM | POA: Diagnosis not present

## 2014-12-09 DIAGNOSIS — I10 Essential (primary) hypertension: Secondary | ICD-10-CM | POA: Diagnosis not present

## 2014-12-09 MED ORDER — FUROSEMIDE 20 MG PO TABS: 20.0000 mg | ORAL_TABLET | Freq: Two times a day (BID) | ORAL | Status: DC

## 2014-12-09 MED ORDER — APIXABAN 5 MG PO TABS: 5.0000 mg | ORAL_TABLET | Freq: Two times a day (BID) | ORAL | Status: DC

## 2014-12-09 NOTE — Assessment & Plan Note (Signed)
Asymptomatic junctional rhythm on today's visit, rate 32 bpm We will hold digoxin, amiodarone, metoprolol Once her heart rate increases above 55, we will start amiodarone 100 mg daily, metoprolol 12.5 mg twice a day Continue to hold digoxin EKG in 2 weeks' time, follow-up with me in clinic in one month Recommended she call us if she has any symptoms of lightheadedness

## 2014-12-09 NOTE — Progress Notes (Signed)
Patient ID: Karen Bowman, female    DOB: 04-13-1940, 74 y.o.   MRN: [id number]  HPI Comments: 74 year old woman with history of paroxysmal atrial fibrillation, hospitalization July 5 with discharge 10/20/2014 for atrial fibrillation, discharged on amiodarone, anticoagulation, digoxin, metoprolol. She presents today for follow-up of her arrhythmia History of hyperlipidemia, total cholesterol 280  In general she reports that she is doing well Heart rate at heart failure clinic was 59 Heart rate today is 32 bpm EKG showing junctional escape rhythm with ST and T-wave abnormality diffusely She reports that she feels well with no complaints of shortness of breath. She continues to have leg edema. She has been drinking significant fluid in the daytime as she has been having bladder problems, pain. She is trying to flush her bladder she's been taking Lasix at 10 in the morning, 9 at night, significant nocturia that keeps her awake. She sleeps late into the morning She reports her swelling is worse on the right lower extremity than the left. Currently not on a potassium supplement She does report some fatigue. She is on numerous other supplements   Notes indicate TEE with cardioversion 10/18/2014 which was unsuccessful  EKG on today's visit as above       Allergies  Allergen Reactions  . Ciprofloxacin     REACTION: dizzy  . Ciprofloxacin Nausea And Vomiting  . Hydrochlorothiazide Nausea And Vomiting  . Hydrochlorothiazide W-Triamterene     REACTION: nausea  . Sulfa Antibiotics Nausea And Vomiting  . Sulfonamide Derivatives     REACTION: nausea \\T \ vomiting  . Valsartan     REACTION: angioedema - probable  . Valsartan Nausea And Vomiting    Current Outpatient Prescriptions on File Prior to Visit  Medication Sig Dispense Refill  . ALOE VERA PO Take 1 tablet by mouth daily.    . Amino Acids (L-CARNITINE) LIQD 1/4 teaspoon liquid per day    . apixaban (ELIQUIS) 5 MG TABS tablet  Take 1 tablet (5 mg total) by mouth 2 (two) times daily. 60 tablet 0  . APPLE CIDER VINEGAR PO Take 2-4 tablets by mouth daily.    . Ascorbic Acid (VITAMIN C) 1000 MG tablet Take 1,000 mg by mouth daily.     Marland Kitchen BEE POLLEN PO Take 2 tablets by mouth daily.     . betamethasone dipropionate (DIPROLENE) 0.05 % cream Apply topically 2 (two) times daily. 30 g 3  . BIOTIN PO Take 1 tablet by mouth daily.    . Cholecalciferol 1000 UNITS capsule Take 2,000 Units by mouth daily.     Marland Kitchen CINNAMON PO Take 2 tablets by mouth daily.     . Coenzyme Q10-Fish Oil-Vit E (CO-Q 10 OMEGA-3 FISH OIL) CAPS Take 1 capsule by mouth 2 (two) times daily.    Marland Kitchen CRANBERRY PO Take 1 tablet by mouth daily.    Marland Kitchen EYEBRIGHT PO Take 1 tablet by mouth daily.    Marland Kitchen Hyaluronic Acid-Vitamin C (HYALURONIC ACID PO) Take 2 tablets by mouth daily.    . L-GLUTAMINE PO Take 1 tablet by mouth daily.    . metoprolol tartrate (LOPRESSOR) 25 MG tablet Take 0.5 tablets (12.5 mg total) by mouth 2 (two) times daily. 60 tablet 0  . MILK THISTLE PO Take 1 tablet by mouth daily.     . Misc Natural Products (BLACK CHERRY CONCENTRATE PO) Take 2 tablets by mouth daily.    Marland Kitchen MISC NATURAL PRODUCTS PO Take 3 tablets by mouth daily. Advanced BP Support-Dr.Sinatra ([place]'s #  1 integrated cardiologist)    . MISC NATURAL PRODUCTS PO Take 1 tablet by mouth daily. Multivitamin-Dr. Talmage Coin    . MISC NATURAL PRODUCTS PO Take 2 tablets by mouth daily. Triveratrol Plus(resveratrol w/other items) Dr. Venetia Maxon    . MISC NATURAL PRODUCTS PO Take 4 tablets by mouth daily. [employer]    . MISC NATURAL PRODUCTS PO Take 2 tablets by mouth daily. Cordyceps    . MISC NATURAL PRODUCTS PO Take 1 tablet by mouth daily. Garlic & Ginger    . MISC NATURAL PRODUCTS PO Take 2 tablets by mouth daily. Ashwagandha    . MISC NATURAL PRODUCTS PO Take 5 tablets by mouth daily. Bone-Up by Gus Height    . MISC NATURAL PRODUCTS PO Advanced Glucosamine Chondroitin-MSM Natures Plus  Ultra Rx Joint Liquid 1/2 capful per day    . MISC NATURAL PRODUCTS PO Take 1 tablet by mouth daily. [place] (w/devil's claw)    . MISC NATURAL PRODUCTS PO Take 2 tablets by mouth daily. Curcumin    . naproxen (NAPROSYN) 500 MG tablet Take 1 tablet (500 mg total) by mouth 2 (two) times daily as needed. 180 tablet 3  . OVER THE COUNTER MEDICATION Take 2 tablets by mouth daily. Omega Q Plus w/resveratrol-Dr. Talmage Coin    . Probiotic CAPS Take 2 capsules by mouth daily.    . Probiotic Product (PROBIOTIC DAILY PO) Dr. Gertie Exon Probiotics one tablet daily    . QUERCETIN PO Take 1-2 tablets by mouth daily. w/bromelain     No current facility-administered medications on file prior to visit.    Past Medical History  Diagnosis Date  . Hypertension   . Heart murmur     Past Surgical History  Procedure Laterality Date  . Tonsillectomy    . Appendectomy    . Tumor removal      right arm  . Bladder suspension    . Abdominal hysterectomy    . Tee without cardioversion N/A 10/18/2014    Procedure: TRANSESOPHAGEAL ECHOCARDIOGRAM (TEE);  Surgeon: Thayer Headings, MD;  Location: [facility];  Service: Cardiovascular;  Laterality: N/A;  . Electrophysiologic study N/A 10/18/2014    Procedure: CARDIOVERSION;  Surgeon: Thayer Headings, MD;  Location: [facility];  Service: Cardiovascular;  Laterality: N/A;    Social History  reports that she has never smoked. She has never used smokeless tobacco. She reports that she does not drink alcohol or use illicit drugs.  Family History family history includes Cancer in her father; Diabetes in her maternal aunt; Heart disease in her mother; Osteoporosis in her mother.   Review of Systems  Constitutional: Negative.   HENT: Negative.   Respiratory: Negative.   Cardiovascular: Positive for leg swelling.  Gastrointestinal: Negative.   Musculoskeletal: Negative.   Neurological: Negative.   Hematological: Negative.   Psychiatric/Behavioral: Negative.     BP  128/70 mmHg  Pulse 32  Ht 5\' 5"  (1.651 m)  Wt 130 lb 12 oz (59.308 kg)  BMI 21.76 kg/m2  LMP  (LMP Unknown)   Physical Exam  Constitutional: She is oriented to person, place, and time. She appears well-developed and well-nourished.  HENT:  Head: Normocephalic.  Nose: Nose normal.  Mouth/Throat: Oropharynx is clear and moist.  Eyes: Conjunctivae are normal. Pupils are equal, round, and reactive to light.  Neck: Normal range of motion. Neck supple. No JVD present.  Cardiovascular: Regular rhythm, normal heart sounds and intact distal pulses.  Bradycardia present.  Exam reveals no gallop and no friction rub.  No murmur heard. Trace edema of the lower extremities, worse on the right than the left  Pulmonary/Chest: Effort normal and breath sounds normal. No respiratory distress. She has no wheezes. She has no rales. She exhibits no tenderness.  Abdominal: Soft. Bowel sounds are normal. She exhibits no distension. There is no tenderness.  Musculoskeletal: Normal range of motion. She exhibits edema. She exhibits no tenderness.  Lymphadenopathy:    She has no cervical adenopathy.  Neurological: She is alert and oriented to person, place, and time. Coordination normal.  Skin: Skin is warm and dry. No rash noted. No erythema.  Psychiatric: She has a normal mood and affect. Her behavior is normal. Judgment and thought content normal.

## 2014-12-09 NOTE — Patient Instructions (Signed)
Please hold the digoxin Please hold the amiodarone Please hold the metoprolol  Monitor your heart rate at home on your blood pressure cuff: When the heart rate goes above 55, then restart metoprolol 1/2 twice a day, and restart 1/2 pill amiodarone once a day   Take lasix early Am and 2 to 3 pm  We will check labs, a BMP  Please call us if you have new issues that need to be addressed before your next appt.  Your physician wants you to follow-up in: ekg in 2 weeks, Follow up 1 month.

## 2014-12-09 NOTE — Assessment & Plan Note (Signed)
Blood pressure well controlled. Medication changes as above for bradycardia

## 2014-12-09 NOTE — Assessment & Plan Note (Signed)
Hyperlipidemia not discussed with her today. Cholesterol is very elevated. She is on numerous supplements. We'll discuss this with her in follow-up

## 2014-12-09 NOTE — Assessment & Plan Note (Signed)
She seems to have converted out of atrial fibrillation possibly one month ago, now with profound bradycardia, escape junctional rhythm We will stop the amiodarone, digoxin, metoprolol

## 2014-12-10 LAB — BASIC METABOLIC PANEL
BUN / CREAT RATIO: 23 (ref 11–26)
BUN: 22 mg/dL (ref 8–27)
CHLORIDE: 101 mmol/L (ref 97–108)
CO2: 21 mmol/L (ref 18–29)
Calcium: 9.4 mg/dL (ref 8.7–10.3)
Creatinine, Ser: 0.95 mg/dL (ref 0.57–1.00)
GFR calc Af Amer: 69 mL/min/{1.73_m2} (ref >59)
GFR calc non Af Amer: 60 mL/min/{1.73_m2} (ref >59)
Glucose: 79 mg/dL (ref 65–99)
Potassium: 5.1 mmol/L (ref 3.5–5.2)
Sodium: 141 mmol/L (ref 134–144)

## 2014-12-12 ENCOUNTER — Encounter: Payer: Self-pay | Admitting: Family

## 2014-12-12 ENCOUNTER — Other Ambulatory Visit: Payer: Self-pay

## 2014-12-12 ENCOUNTER — Ambulatory Visit: Payer: Medicare PPO | Attending: Family | Admitting: Family

## 2014-12-12 VITALS — BP 162/42 | HR 42 | Resp 20 | Ht 65.0 in | Wt 131.0 lb

## 2014-12-12 DIAGNOSIS — R001 Bradycardia, unspecified: Secondary | ICD-10-CM | POA: Diagnosis not present

## 2014-12-12 DIAGNOSIS — R011 Cardiac murmur, unspecified: Secondary | ICD-10-CM | POA: Insufficient documentation

## 2014-12-12 DIAGNOSIS — I10 Essential (primary) hypertension: Secondary | ICD-10-CM

## 2014-12-12 DIAGNOSIS — Z7901 Long term (current) use of anticoagulants: Secondary | ICD-10-CM | POA: Diagnosis not present

## 2014-12-12 DIAGNOSIS — I5022 Chronic systolic (congestive) heart failure: Secondary | ICD-10-CM | POA: Diagnosis not present

## 2014-12-12 DIAGNOSIS — Z79899 Other long term (current) drug therapy: Secondary | ICD-10-CM | POA: Insufficient documentation

## 2014-12-12 DIAGNOSIS — I480 Paroxysmal atrial fibrillation: Secondary | ICD-10-CM

## 2014-12-12 DIAGNOSIS — I4891 Unspecified atrial fibrillation: Secondary | ICD-10-CM | POA: Diagnosis not present

## 2014-12-12 NOTE — Progress Notes (Signed)
Subjective:    Patient ID: Karen Bowman, female    DOB: 1940/05/24, 74 y.o.   MRN: [id number]  Congestive Heart Failure Presents for follow-up visit. The disease course has been stable. Associated symptoms include edema, fatigue and palpitations. Pertinent negatives include no abdominal pain, chest pain, chest pressure, orthopnea, shortness of breath or unexpected weight change. The symptoms have been stable. Past treatments include beta blockers, digoxin and salt and fluid restriction. The treatment provided moderate relief. Compliance with prior treatments has been good. Her past medical history is significant for HTN. There is no history of CVA. She has one 1st degree relative with heart disease. Compliance with total regimen is 76-100%.  Other This is a chronic (edema) problem. The current episode started more than 1 month ago. The problem occurs daily. The problem has been unchanged. Associated symptoms include fatigue. Pertinent negatives include no abdominal pain, chest pain, congestion, coughing, headaches, neck pain or sore throat. The symptoms are aggravated by walking and standing. She has tried position changes for the symptoms. The treatment provided significant (completely resolves overnight) relief.    Past Medical History  Diagnosis Date  . Hypertension   . Heart murmur    Past Surgical History  Procedure Laterality Date  . Tonsillectomy    . Appendectomy    . Tumor removal      right arm  . Bladder suspension    . Abdominal hysterectomy    . Tee without cardioversion N/A 10/18/2014    Procedure: TRANSESOPHAGEAL ECHOCARDIOGRAM (TEE);  Surgeon: Thayer Headings, MD;  Location: [facility];  Service: Cardiovascular;  Laterality: N/A;  . Electrophysiologic study N/A 10/18/2014    Procedure: CARDIOVERSION;  Surgeon: Thayer Headings, MD;  Location: [facility] OR[facility];  Service: Cardiovascular;  Laterality: N/A;    Family History  Problem Relation Age of Onset  . Osteoporosis Mother    . Heart disease Mother   . Cancer Father   . Diabetes Maternal Aunt     Social History  Substance Use Topics  . Smoking status: Never Smoker   . Smokeless tobacco: Never Used  . Alcohol Use: No    Allergies  Allergen Reactions  . Ciprofloxacin     REACTION: dizzy  . Ciprofloxacin Nausea And Vomiting  . Hydrochlorothiazide Nausea And Vomiting  . Hydrochlorothiazide W-Triamterene     REACTION: nausea  . Sulfa Antibiotics Nausea And Vomiting  . Sulfonamide Derivatives     REACTION: nausea \\T \ vomiting  . Valsartan     REACTION: angioedema - probable  . Valsartan Nausea And Vomiting    Prior to Admission medications   Medication Sig Start Date End Date Taking? Authorizing Provider  ALOE VERA PO Take 1 tablet by mouth daily.   Yes Historical Provider, MD  Amino Acids (L-CARNITINE) LIQD 1/4 teaspoon liquid per day   Yes Historical Provider, MD  apixaban (ELIQUIS) 5 MG TABS tablet Take 1 tablet (5 mg total) by mouth 2 (two) times daily. 12/09/14  Yes Minna Merritts, MD  APPLE CIDER VINEGAR PO Take 2-4 tablets by mouth daily.   Yes Historical Provider, MD  Ascorbic Acid (VITAMIN C) 1000 MG tablet Take 1,000 mg by mouth daily.    Yes Historical Provider, MD  BEE POLLEN PO Take 2 tablets by mouth daily.    Yes Historical Provider, MD  betamethasone dipropionate (DIPROLENE) 0.05 % cream Apply topically 2 (two) times daily. 02/01/14  Yes Amy E Bedsole, MD  BIOTIN PO Take 1 tablet by mouth daily.  Yes Historical Provider, MD  Cholecalciferol 1000 UNITS capsule Take 2,000 Units by mouth daily.    Yes Historical Provider, MD  CINNAMON PO Take 2 tablets by mouth daily.    Yes Historical Provider, MD  Coenzyme Q10-Fish Oil-Vit E (CO-Q 10 OMEGA-3 FISH OIL) CAPS Take 1 capsule by mouth 2 (two) times daily.   Yes Historical Provider, MD  CRANBERRY PO Take 1 tablet by mouth daily.   Yes Historical Provider, MD  EYEBRIGHT PO Take 1 tablet by mouth daily.   Yes Historical Provider, MD   furosemide (LASIX) 20 MG tablet Take 1 tablet (20 mg total) by mouth 2 (two) times daily. 12/09/14  Yes Minna Merritts, MD  Hyaluronic Acid-Vitamin C (HYALURONIC ACID PO) Take 2 tablets by mouth daily.   Yes Historical Provider, MD  L-GLUTAMINE PO Take 1 tablet by mouth daily.   Yes Historical Provider, MD  MILK THISTLE PO Take 1 tablet by mouth daily.    Yes Historical Provider, MD  Misc Natural Products (BLACK CHERRY CONCENTRATE PO) Take 2 tablets by mouth daily.   Yes Historical Provider, MD  MISC NATURAL PRODUCTS PO Take 4 tablets by mouth daily. Advanced BP Support-Dr.Sinatra ([place]'s #1 integrated cardiologist)   Yes Historical Provider, MD  MISC NATURAL PRODUCTS PO Take 1 tablet by mouth daily. Multivitamin-Dr. Talmage Coin   Yes Historical Provider, MD  MISC NATURAL PRODUCTS PO Take 4 tablets by mouth daily. [place]   Yes Historical Provider, MD  MISC NATURAL PRODUCTS PO Take 1 tablet by mouth daily. Ashwagandha   Yes Historical Provider, MD  MISC NATURAL PRODUCTS PO Take 5 tablets by mouth daily. Bone-Up by Gus Height   Yes Historical Provider, MD  MISC NATURAL PRODUCTS PO Advanced Glucosamine Chondroitin-MSM Natures Plus Ultra Rx Joint Liquid 1/2 capful per day   Yes Historical Provider, MD  MISC NATURAL PRODUCTS PO Take 2 tablets by mouth daily. Curcumin   Yes Historical Provider, MD  OVER THE COUNTER MEDICATION Take 2 tablets by mouth daily. Omega Q Plus w/resveratrol-Dr. Talmage Coin   Yes Historical Provider, MD  Probiotic CAPS Take 2 capsules by mouth daily.   Yes Historical Provider, MD  Probiotic Product (PROBIOTIC DAILY PO) Dr. Gertie Exon Probiotics one tablet daily   Yes Historical Provider, MD  QUERCETIN PO Take 1-2 tablets by mouth daily. w/bromelain   Yes Historical Provider, MD  ROYAL JELLY PO Take 1 tablet by mouth daily.   Yes Historical Provider, MD  RUTIN PO Take 1 capsule by mouth 2 (two) times daily.   Yes Historical Provider, MD    Review of Systems   Constitutional: Positive for fatigue. Negative for appetite change and unexpected weight change.  HENT: Positive for rhinorrhea. Negative for congestion and sore throat.   Eyes: Negative.   Respiratory: Negative for cough, chest tightness, shortness of breath and wheezing.   Cardiovascular: Positive for palpitations and leg swelling (R>L). Negative for chest pain.  Gastrointestinal: Negative for abdominal pain and abdominal distention.  Endocrine: Negative.   Genitourinary: Positive for dysuria (urinary pressure). Negative for flank pain.  Musculoskeletal: Positive for back pain (chronic). Negative for neck pain.  Skin: Negative.   Allergic/Immunologic: Positive for environmental allergies. Negative for food allergies.  Neurological: Negative for dizziness, light-headedness and headaches.  Hematological: Negative for adenopathy. Does not bruise/bleed easily.  Psychiatric/Behavioral: Positive for sleep disturbance (sleeping on 2 pillows). Negative for dysphoric mood. The patient is not nervous/anxious.        Objective:   Physical Exam  Constitutional:  She is oriented to person, place, and time. She appears well-developed and well-nourished.  HENT:  Head: Normocephalic and atraumatic.  Eyes: Conjunctivae are normal. Pupils are equal, round, and reactive to light.  Neck: Normal range of motion. Neck supple.  Cardiovascular: Regular rhythm.  Bradycardia present.   No murmur heard. Pulmonary/Chest: Effort normal. She has no wheezes. She has no rales.  Abdominal: Soft. She exhibits no distension. There is no tenderness.  Musculoskeletal: She exhibits edema (1+ edema in right lower leg, trace amount in left lower leg). She exhibits no tenderness.  Neurological: She is alert and oriented to person, place, and time.  Skin: Skin is warm and dry.  Psychiatric: She has a normal mood and affect. Her behavior is normal. Thought content normal.  Nursing note and vitals reviewed.   BP 162/42  mmHg  Pulse 42  Resp 20  Ht 5\' 5"  (1.651 m)  Wt 131 lb (59.421 kg)  BMI 21.80 kg/m2  SpO2 98%  LMP  (LMP Unknown)       Assessment & Plan:  1: Chronic heart failure with reduced ejection fraction- Patient presents with fatigue upon exertion. She says that when she does experience tiredness, she will either sit down or lay down to rest for a little bit and then she will resume her activities. She continues to have some edema in her lower leg with R>L. She says that when she wakes up in the mornings, the swelling is completely gone but within a couple of hours of being up, they become swollen again. Does wear "light compression" hose on both legs. Continues to weigh herself and hasn't had any overnight weight gains. Reminded to call for an overnight weight gain of >2 pounds or a weekly weight gain of >5 pounds. She is not adding salt to her food and tries to follow a low sodium diet. Deferring ACE-i use to her cardiologist since she has an allergy to valsartan and due to her numerous herbal supplements that she takes.  2: HTN- Blood pressure elevated today but she did not take her medication prior to coming to the visit. She says that she will take everything once she gets home. She's had some medication changes by her cardiologist but she does continue to take the Advanced BP support supplements. Blood pressure at cardiologist office was normal. 3: Atrial fibrillation- Currently in normal rhythm. She did notice some palpitations over the weekend but she had some medications stopped. Hasn't noticed any in the last 24 hours. 4: Bradycardia- Heart rate remains low in the 40's but was in the 30's at cardiologist's office. Amiodarone, metoprolol and digoxin were all held for now.   Return in 3 months or sooner for any questions/problems before the next office visit.

## 2014-12-12 NOTE — Patient Instructions (Addendum)
Continue weighing daily and call for an overnight weight gain of > 2 pounds or a weekly weight gain of >5 pounds. 

## 2014-12-13 ENCOUNTER — Telehealth: Payer: Self-pay

## 2014-12-13 NOTE — Telephone Encounter (Signed)
Pt has a question regarding her labwork. Please call.

## 2014-12-13 NOTE — Telephone Encounter (Signed)
Spoke w/ pt.  Reviewed her lab results in depth w/ her.  She is feeling much better and HR has been "good". Asked her to call back w/ any further questions or concerns.

## 2014-12-22 ENCOUNTER — Other Ambulatory Visit: Payer: Self-pay

## 2014-12-22 ENCOUNTER — Ambulatory Visit (INDEPENDENT_AMBULATORY_CARE_PROVIDER_SITE_OTHER): Payer: Medicare PPO

## 2014-12-22 VITALS — BP 152/80 | HR 57 | Ht 65.0 in | Wt 130.3 lb

## 2014-12-22 DIAGNOSIS — I480 Paroxysmal atrial fibrillation: Secondary | ICD-10-CM

## 2014-12-22 NOTE — Patient Instructions (Addendum)
Your physician recommends that you continue on your current medications as directed. Please refer to the Current Medication list given to you today.  Please keep your follow up with Dr. Rockey Situ

## 2014-12-22 NOTE — Progress Notes (Signed)
1.) Reason for visit: EKG  2.) Name of MD requesting visit: Dr. Rockey Situ  3.) H&P: Pt's HR at ov 12/09/14 was 10.  Pt advised at that time to: " Please hold the digoxin Please hold the amiodarone Please hold the metoprolol  Monitor your heart rate at home on your blood pressure cuff: When the heart rate goes above 55, then restart metoprolol 1/2 twice a day, and restart 1/2 pill amiodarone once a day "  4.) ROS related to problem: Pt reports that her HR got up above 55 about 2 days ago, so she has resumed metoprolol 12.5 mg and amiodarone 100 mg.   Pt feels good, her HR today is 57.  5.) Assessment and plan per MD: Dr. Fletcher Anon looked over EKG and advises that pt not make any med changes at this time.  Advised pt that I will make Dr. Rockey Situ aware and call her with any further recommendations.  She is sched to be seen for f/u on 01/03/15.

## 2014-12-23 ENCOUNTER — Ambulatory Visit (INDEPENDENT_AMBULATORY_CARE_PROVIDER_SITE_OTHER): Payer: Medicare PPO | Admitting: Urology

## 2014-12-23 ENCOUNTER — Encounter: Payer: Self-pay | Admitting: Urology

## 2014-12-23 ENCOUNTER — Other Ambulatory Visit: Payer: Self-pay

## 2014-12-23 VITALS — BP 192/73 | HR 64 | Ht 65.0 in | Wt 130.4 lb

## 2014-12-23 DIAGNOSIS — N952 Postmenopausal atrophic vaginitis: Secondary | ICD-10-CM | POA: Diagnosis not present

## 2014-12-23 DIAGNOSIS — R102 Pelvic and perineal pain: Secondary | ICD-10-CM | POA: Insufficient documentation

## 2014-12-23 DIAGNOSIS — N8111 Cystocele, midline: Secondary | ICD-10-CM | POA: Diagnosis not present

## 2014-12-23 DIAGNOSIS — N9489 Other specified conditions associated with female genital organs and menstrual cycle: Secondary | ICD-10-CM

## 2014-12-23 DIAGNOSIS — N362 Urethral caruncle: Secondary | ICD-10-CM | POA: Diagnosis not present

## 2014-12-23 DIAGNOSIS — N8110 Cystocele, unspecified: Secondary | ICD-10-CM

## 2014-12-23 LAB — URINALYSIS, COMPLETE
BILIRUBIN UA: NEGATIVE
GLUCOSE, UA: NEGATIVE
KETONES UA: NEGATIVE
Nitrite, UA: NEGATIVE
Protein, UA: NEGATIVE
RBC UA: NEGATIVE
SPEC GRAV UA: 1.015 (ref 1.005–1.030)
Urobilinogen, Ur: 0.2 mg/dL (ref 0.2–1.0)
pH, UA: 6 (ref 5.0–7.5)

## 2014-12-23 LAB — MICROSCOPIC EXAMINATION
RBC, UA: NONE SEEN /hpf (ref 0–?)
RENAL EPITHEL UA: NONE SEEN /HPF

## 2014-12-23 LAB — BLADDER SCAN AMB NON-IMAGING

## 2014-12-23 MED ORDER — ESTRADIOL 0.1 MG/GM VA CREA: 1.0000 | TOPICAL_CREAM | VAGINAL | Status: DC

## 2014-12-23 MED ORDER — AMLODIPINE BESYLATE 5 MG PO TABS: 2.5000 mg | ORAL_TABLET | Freq: Every day | ORAL | Status: DC

## 2014-12-23 NOTE — Progress Notes (Signed)
12/23/2014 4:01 PM   Karen Bowman 1941-01-07 [id number]  Referring provider: Jinny Sanders, MD [place], [place] [address]  CC: Pelvic Pressure  HPI:  1 - Pelvic Pressure - new bother from episodic daily pelvic pressure after starting Lasix 20 BID for progressive CHF. Overall modest bother. Pelvic 12/2014 w/o prolapse, but sensitive caruncle noted as per below. PVR 12/2014 "79mL" which is normal.  2 - Cystocele - s/p cystocele repair by GYN in early 1990s for symptomatic prolapse following benign hyst many years prior. No prolapse by exam 12/2014.  3 - Atrophic Vaginitis / Caruncle - pt with pea sized and very sensitive caruncle by pelvic 12/2014. She endorses this area accounts for "about 50%" of bother from above and worse when sits on hard surface. No h/o estrogen-sensitive malignancy.   PMH sig for benign hyst, cystocele repair, Afib/RVR + CHF (follows [facility] cards).  Today "Karen Bowman" is seen as new patient for above.   PMH: Past Medical History  Diagnosis Date  . Hypertension   . Heart murmur   . Anxiety   . Arrhythmia   . Arthritis   . Atrial fibrillation   . Acute heart failure   . Hyperlipemia     Surgical History: Past Surgical History  Procedure Laterality Date  . Tonsillectomy    . Appendectomy    . Tumor removal      right arm  . Bladder suspension    . Abdominal hysterectomy    . Tee without cardioversion N/A 10/18/2014    Procedure: TRANSESOPHAGEAL ECHOCARDIOGRAM (TEE);  Surgeon: Thayer Headings, MD;  Location: [facility] ORS;  Service: Cardiovascular;  Laterality: N/A;  . Electrophysiologic study N/A 10/18/2014    Procedure: CARDIOVERSION;  Surgeon: Thayer Headings, MD;  Location: [facility] ORS;  Service: Cardiovascular;  Laterality: N/A;    Home Medications:    Medication List       This list is accurate as of: 12/23/14  4:01 PM.  Always use your most recent med list.               amiodarone 200 MG tablet  Commonly known as:   PACERONE  Take 200 mg by mouth daily.     amLODipine 5 MG tablet  Commonly known as:  NORVASC  Take 0.5 tablets (2.5 mg total) by mouth daily.     apixaban 5 MG Tabs tablet  Commonly known as:  ELIQUIS  Take 1 tablet (5 mg total) by mouth 2 (two) times daily.     BEE POLLEN PO  Take 2 tablets by mouth daily.     betamethasone dipropionate 0.05 % cream  Commonly known as:  DIPROLENE  Apply topically 2 (two) times daily.     Cholecalciferol 1000 UNITS capsule  Take 2,000 Units by mouth daily.     CINNAMON PO  Take 2 tablets by mouth daily.     CO-Q 10 Omega-3 Fish Oil Caps  Take 1 capsule by mouth 2 (two) times daily.     CRANBERRY PO  Take 1 tablet by mouth daily.     EYEBRIGHT PO  Take 1 tablet by mouth daily.     furosemide 20 MG tablet  Commonly known as:  LASIX  Take 1 tablet (20 mg total) by mouth 2 (two) times daily.     HYALURONIC ACID PO  Take 2 tablets by mouth daily.     L-GLUTAMINE PO  Take 1 tablet by mouth daily.  metoprolol succinate 50 MG 24 hr tablet  Commonly known as:  TOPROL-XL  Take 25 mg by mouth daily. Take with or immediately following a meal.     MILK THISTLE PO  Take 1 tablet by mouth daily.     MISC NATURAL PRODUCTS PO  Take 4 tablets by mouth daily. Advanced BP Support-Dr.Sinatra ([place]'s #1 integrated cardiologist)     MISC NATURAL PRODUCTS PO  Take 1 tablet by mouth daily. Multivitamin-Dr. Talmage Coin     MISC NATURAL PRODUCTS PO  Take 4 tablets by mouth daily. [place] [place]     MISC NATURAL PRODUCTS PO  Take 1 tablet by mouth daily. Ashwagandha     MISC NATURAL PRODUCTS PO  Take 5 tablets by mouth daily. Bone-Up by Gus Height     MISC NATURAL PRODUCTS PO  Advanced Glucosamine Chondroitin-MSM Natures Plus Ultra Rx Joint Liquid 1/2 capful per day     MISC NATURAL PRODUCTS PO  Take 2 tablets by mouth daily. Curcumin     OVER THE COUNTER MEDICATION  Take 2 tablets by mouth daily. Omega Q Plus w/resveratrol-Dr.  Talmage Coin     PROBIOTIC DAILY PO  Dr. Gertie Exon Probiotics one tablet daily     Probiotic Caps  Take 2 capsules by mouth daily.     QUERCETIN PO  Take 1-2 tablets by mouth daily. w/bromelain     ROYAL JELLY PO  Take 1 tablet by mouth daily.     RUTIN PO  Take 1 capsule by mouth 2 (two) times daily.        Allergies:  Allergies  Allergen Reactions  . Ciprofloxacin     REACTION: dizzy  . Ciprofloxacin Nausea And Vomiting  . Hydrochlorothiazide Nausea And Vomiting  . Hydrochlorothiazide W-Triamterene     REACTION: nausea  . Sulfa Antibiotics Nausea And Vomiting  . Sulfonamide Derivatives     REACTION: nausea \\T \ vomiting  . Valsartan     REACTION: angioedema - probable  . Valsartan Nausea And Vomiting    Family History: Family History  Problem Relation Age of Onset  . Osteoporosis Mother   . Heart disease Mother   . Cancer Father   . Diabetes Maternal Aunt     Social History:  reports that she has never smoked. She has never used smokeless tobacco. She reports that she does not drink alcohol or use illicit drugs.  ROS: UROLOGY Frequent Urination?: Yes Hard to postpone urination?: Yes Burning/pain with urination?: No Get up at night to urinate?: Yes Leakage of urine?: Yes Urine stream starts and stops?: No Trouble starting stream?: Yes Do you have to strain to urinate?: No Blood in urine?: No Urinary tract infection?: No Sexually transmitted disease?: No Injury to kidneys or bladder?: No Painful intercourse?: No Weak stream?: No Currently pregnant?: No Vaginal bleeding?: No Last menstrual period?: hysterectomy  Gastrointestinal Nausea?: No Vomiting?: No Indigestion/heartburn?: No Diarrhea?: No Constipation?: No  Constitutional Fever: No Night sweats?: No Weight loss?: Yes Fatigue?: Yes  Skin Skin rash/lesions?: Yes Itching?: No  Eyes Blurred vision?: No Double vision?: No  Ears/Nose/Throat Sore throat?: No Sinus problems?:  Yes  Hematologic/Lymphatic Swollen glands?: No Easy bruising?: No  Cardiovascular Leg swelling?: Yes Chest pain?: No  Respiratory Cough?: No Shortness of breath?: Yes  Endocrine Excessive thirst?: No  Musculoskeletal Back pain?: Yes Joint pain?: Yes  Neurological Headaches?: No Dizziness?: No  Psychologic Depression?: No Anxiety?: No  Physical Exam: BP 192/73 mmHg  Pulse 64  Ht 5\' 5"  (1.651 m)  Wt 130  lb 6.4 oz (59.149 kg)  BMI 21.70 kg/m2  LMP  (LMP Unknown)  Constitutional:  Alert and oriented, No acute distress. HEENT: [place] AT, moist mucus membranes.  Trachea midline, no masses. Cardiovascular: No clubbing, cyanosis, or edema. Respiratory: Normal respiratory effort, no increased work of breathing. GI: Abdomen is soft, nontender, nondistended, no abdominal masses GU: No CVA tenderness. Pelvic with significant vaginal atrophy, absent cervix / uterus. Pea size urethral caruncle that is very sensitive and pt localizes "about 50%" of her discomfort to this area.  Skin: No rashes, bruises or suspicious lesions. Lymph: No cervical or inguinal adenopathy. Neurologic: Grossly intact, no focal deficits, moving all 4 extremities. Psychiatric: Normal mood and affect.  Laboratory Data: Lab Results  Component Value Date   WBC 5.4 10/18/2014   HGB 15.2 10/18/2014   HCT 45.8 10/18/2014   MCV 91.0 10/18/2014   PLT 210 10/18/2014    Lab Results  Component Value Date   CREATININE 0.95 12/09/2014    No results found for: PSA  No results found for: TESTOSTERONE  Lab Results  Component Value Date   HGBA1C 5.7 10/15/2014    Urinalysis    Component Value Date/Time   COLORURINE YELLOW* 10/15/2014 1213   APPEARANCEUR CLEAR* 10/15/2014 1213   LABSPEC 1.015 10/15/2014 1213   PHURINE 5.0 10/15/2014 1213   GLUCOSEU NEGATIVE 10/15/2014 1213   HGBUR NEGATIVE 10/15/2014 1213   [place] 10/15/2014 1213   [place] 10/15/2014 1213   PROTEINUR  NEGATIVE 10/15/2014 1213   NITRITE NEGATIVE 10/15/2014 1213   LEUKOCYTESUR NEGATIVE 10/15/2014 1213    Pertinent Imaging:  PVR "35mL"  Assessment & Plan:    1 - Pelvic Pressure - about 50% from rapid bladder filling from Lasix, explained this is normal physiologic response, and pt understands and is happy to no no problems with bladder function, which there is clearly not based on normal PVR and no incontinence. I feel about 50% of bother from caruncle as per below. She may discuss with her cardiologist about more long-acting diuretic that may minimize the rapid 3-4 hour diuresis after lasix doses. And smooth out more over 24 hr period.   2 - Cystocele - no recurrence by exam, observe.   3 - Atrophic Vaginitis / Caruncle - discussed options of observation v. Use of topical estrogen cream, she would like to try. Estrace sample given and RX sent to pharmacy. Explained may take few weeks to notice effect.  4 - RTC  1 year.    Alexis Frock, [facility] [address], [place], [place] [address] [phone]

## 2014-12-30 ENCOUNTER — Other Ambulatory Visit: Payer: Self-pay

## 2014-12-30 ENCOUNTER — Other Ambulatory Visit: Payer: Self-pay | Admitting: *Deleted

## 2014-12-30 MED ORDER — METOPROLOL TARTRATE 25 MG PO TABS: 12.5000 mg | ORAL_TABLET | Freq: Two times a day (BID) | ORAL | Status: DC

## 2014-12-30 MED ORDER — METOPROLOL SUCCINATE ER 25 MG PO TB24: 25.0000 mg | ORAL_TABLET | Freq: Every day | ORAL | Status: DC

## 2014-12-30 NOTE — Telephone Encounter (Signed)
Pt should be taking 25 mg 1/2 bid as directed per Dr. Rockey Situ last ov.

## 2014-12-30 NOTE — Telephone Encounter (Signed)
Rx sent to local pharmacy pt request 1/2 tablet Metoprolol 25 mg bid.

## 2014-12-30 NOTE — Telephone Encounter (Signed)
Pt asks for only 1/2 a rx.

## 2015-01-03 ENCOUNTER — Ambulatory Visit (INDEPENDENT_AMBULATORY_CARE_PROVIDER_SITE_OTHER): Payer: Medicare PPO | Admitting: Cardiovascular Disease

## 2015-01-03 ENCOUNTER — Encounter: Payer: Self-pay | Admitting: Cardiovascular Disease

## 2015-01-03 VITALS — BP 134/70 | HR 52 | Ht 65.0 in | Wt 129.5 lb

## 2015-01-03 DIAGNOSIS — I5022 Chronic systolic (congestive) heart failure: Secondary | ICD-10-CM

## 2015-01-03 DIAGNOSIS — E782 Mixed hyperlipidemia: Secondary | ICD-10-CM

## 2015-01-03 DIAGNOSIS — R001 Bradycardia, unspecified: Secondary | ICD-10-CM | POA: Diagnosis not present

## 2015-01-03 DIAGNOSIS — I498 Other specified cardiac arrhythmias: Secondary | ICD-10-CM

## 2015-01-03 DIAGNOSIS — I4891 Unspecified atrial fibrillation: Secondary | ICD-10-CM

## 2015-01-03 NOTE — Assessment & Plan Note (Signed)
Previously noted junctional rhythm has resolved. Likely secondary to prior medication regimen. Now maintaining normal sinus rhythm After medication changes, lower doses

## 2015-01-03 NOTE — Assessment & Plan Note (Signed)
Maintaining normal sinus rhythm. No changes to her medications Encouraged her to take extra amiodarone and metoprolol for any breakthrough arrhythmia She will stay on anticoagulation

## 2015-01-03 NOTE — Patient Instructions (Addendum)
You are doing well.  Please check on the dose of the amiodarone (100 vs 200 mg ?)  If you go back into atrial fibrillation, Take extra amiodarone, maybe extra metoprolol  Ok to cut back on the morning lasix as you see fit, Monitor your weight Keep the weight stable  Please call us if you have new issues that need to be addressed before your next appt.  Your physician wants you to follow-up in: 6 months.  You will receive a reminder letter in the mail two months in advance. If you don't receive a letter, please call our office to schedule the follow-up appointment.

## 2015-01-03 NOTE — Assessment & Plan Note (Signed)
Will recommend repeat cholesterol panel when she has follow-up with primary care. She takes numerous over-the-counter supplements.prior cholesterol 286. No known coronary artery disease We'll discuss again in follow-up

## 2015-01-03 NOTE — Assessment & Plan Note (Signed)
She is followed by Lasix twice a day dosing, she attributes this to her vaginal irritation. Suggested she could try to decrease the Lasix down to daily every other day, alternating with twice a day dosing If she is able to decrease her fluids, could potentially go down to down to daily dosing but she has high fluid intake

## 2015-01-03 NOTE — Progress Notes (Signed)
Patient ID: Karen Bowman, female    DOB: 10/30/1940, 74 y.o.   MRN: [id number]  HPI Comments: 74 year old woman with history of paroxysmal atrial fibrillation, hospitalization July 5 with discharge 10/20/2014 for atrial fibrillation, discharged on amiodarone, anticoagulation, digoxin, metoprolol.  History of hyperlipidemia, total cholesterol 280 On her last clinic visit she had converted to normal sinus rhythm, rate 32 bpm. Amiodarone was decreased, metoprolol dose decreased, digoxin held. She presents today for follow-up of her atrial fibrillation  She reports that she feels well. She does not think that she has had any significant arrhythmia. In the past she could tell something was wrong but could not clearly identify her arrhythmia.  In general her energy is that her, feels back to normal. Trace lower extremity edema. Her biggest complaint is some vaginal irritation. She attributes this to the Lasix which she takes twice a day. She has some back discomfort for muscle spasms. Potassium mildly elevated on recent BMP, 5.1 She does have significant over-the-counter supplements that she takes, eats bananas, potatoes, cranberry pills She is unclear if she is taking amiodarone 100 mg a day or 200 mg per day  EKG on today's visit shows normal sinus rhythm with rate 52 bpm, no significant ST or T-wave changes  Other past medical history Notes indicate TEE with cardioversion 10/18/2014 which was unsuccessful        Allergies  Allergen Reactions  . Ciprofloxacin     REACTION: dizzy  . Ciprofloxacin Nausea And Vomiting  . Hydrochlorothiazide Nausea And Vomiting  . Hydrochlorothiazide W-Triamterene     REACTION: nausea  . Sulfa Antibiotics Nausea And Vomiting  . Sulfonamide Derivatives     REACTION: nausea \\T \ vomiting  . Valsartan     REACTION: angioedema - probable  . Valsartan Nausea And Vomiting    Current Outpatient Prescriptions on File Prior to Visit  Medication Sig  Dispense Refill  . amiodarone (PACERONE) 200 MG tablet Take 200 mg by mouth daily.    Marland Kitchen amLODipine (NORVASC) 5 MG tablet Take 0.5 tablets (2.5 mg total) by mouth daily. 90 tablet 3  . apixaban (ELIQUIS) 5 MG TABS tablet Take 1 tablet (5 mg total) by mouth 2 (two) times daily. 180 tablet 3  . BEE POLLEN PO Take 2 tablets by mouth daily.     . betamethasone dipropionate (DIPROLENE) 0.05 % cream Apply topically 2 (two) times daily. 30 g 3  . Cholecalciferol 1000 UNITS capsule Take 2,000 Units by mouth daily.     Marland Kitchen CINNAMON PO Take 2 tablets by mouth daily.     . Coenzyme Q10-Fish Oil-Vit E (CO-Q 10 OMEGA-3 FISH OIL) CAPS Take 1 capsule by mouth 2 (two) times daily.    Marland Kitchen CRANBERRY PO Take 1 tablet by mouth daily.    Marland Kitchen estradiol (ESTRACE) 0.1 MG/GM vaginal cream Place 1 Applicatorful vaginally once a week. 42.5 g 12  . EYEBRIGHT PO Take 1 tablet by mouth daily.    . furosemide (LASIX) 20 MG tablet Take 1 tablet (20 mg total) by mouth 2 (two) times daily. 180 tablet 3  . Hyaluronic Acid-Vitamin C (HYALURONIC ACID PO) Take 2 tablets by mouth daily.    . L-GLUTAMINE PO Take 1 tablet by mouth daily.    . metoprolol tartrate (LOPRESSOR) 25 MG tablet Take 0.5 tablets (12.5 mg total) by mouth 2 (two) times daily. 30 tablet 3  . MILK THISTLE PO Take 1 tablet by mouth daily.     Marland Kitchen MISC NATURAL PRODUCTS PO  Take 4 tablets by mouth daily. Advanced BP Support-Dr.Sinatra ([place]'s #1 integrated cardiologist)    . MISC NATURAL PRODUCTS PO Take 1 tablet by mouth daily. Multivitamin-Dr. Talmage Coin    . MISC NATURAL PRODUCTS PO Take 4 tablets by mouth daily. [employer]    . MISC NATURAL PRODUCTS PO Take 1 tablet by mouth daily. Ashwagandha    . MISC NATURAL PRODUCTS PO Take 5 tablets by mouth daily. Bone-Up by Gus Height    . MISC NATURAL PRODUCTS PO Advanced Glucosamine Chondroitin-MSM Natures Plus Ultra Rx Joint Liquid 1/2 capful per day    . MISC NATURAL PRODUCTS PO Take 2 tablets by mouth daily. Curcumin     . OVER THE COUNTER MEDICATION Take 2 tablets by mouth daily. Omega Q Plus w/resveratrol-Dr. Talmage Coin    . Probiotic CAPS Take 2 capsules by mouth daily.    . Probiotic Product (PROBIOTIC DAILY PO) Dr. Gertie Exon Probiotics one tablet daily    . QUERCETIN PO Take 1-2 tablets by mouth daily. w/bromelain    . ROYAL JELLY PO Take 1 tablet by mouth daily.    Marland Kitchen RUTIN PO Take 1 capsule by mouth 2 (two) times daily.     No current facility-administered medications on file prior to visit.    Past Medical History  Diagnosis Date  . Hypertension   . Heart murmur   . Anxiety   . Arrhythmia   . Arthritis   . Atrial fibrillation ([place])   . Acute heart failure ([place])   . Hyperlipemia     Past Surgical History  Procedure Laterality Date  . Tonsillectomy    . Appendectomy    . Tumor removal      right arm  . Bladder suspension    . Abdominal hysterectomy    . Tee without cardioversion N/A 10/18/2014    Procedure: TRANSESOPHAGEAL ECHOCARDIOGRAM (TEE);  Surgeon: Thayer Headings, MD;  Location: [facility] ORS;  Service: Cardiovascular;  Laterality: N/A;  . Electrophysiologic study N/A 10/18/2014    Procedure: CARDIOVERSION;  Surgeon: Thayer Headings, MD;  Location: [facility] ORS;  Service: Cardiovascular;  Laterality: N/A;    Social History  reports that she has never smoked. She has never used smokeless tobacco. She reports that she does not drink alcohol or use illicit drugs.  Family History family history includes Cancer in her father; Diabetes in her maternal aunt; Heart disease in her mother; Osteoporosis in her mother.   Review of Systems  Constitutional: Negative.   Respiratory: Negative.   Cardiovascular: Positive for leg swelling.  Gastrointestinal: Negative.   Musculoskeletal: Negative.   Neurological: Negative.   Hematological: Negative.   Psychiatric/Behavioral: Negative.   All other systems reviewed and are negative.   BP 134/70 mmHg  Pulse 52  Ht 5\' 5"  (1.651 m)  Wt 129 lb 8 oz  (58.741 kg)  BMI 21.55 kg/m2  LMP  (LMP Unknown)   Physical Exam  Constitutional: She is oriented to person, place, and time. She appears well-developed and well-nourished.  HENT:  Head: Normocephalic.  Nose: Nose normal.  Mouth/Throat: Oropharynx is clear and moist.  Eyes: Conjunctivae are normal. Pupils are equal, round, and reactive to light.  Neck: Normal range of motion. Neck supple. No JVD present.  Cardiovascular: Regular rhythm, normal heart sounds and intact distal pulses.  Bradycardia present.  Exam reveals no gallop and no friction rub.   No murmur heard. minimal edema of the lower extremities  Pulmonary/Chest: Effort normal and breath sounds normal. No respiratory distress. She  has no wheezes. She has no rales. She exhibits no tenderness.  Abdominal: Soft. Bowel sounds are normal. She exhibits no distension. There is no tenderness.  Musculoskeletal: Normal range of motion. She exhibits edema. She exhibits no tenderness.  Lymphadenopathy:    She has no cervical adenopathy.  Neurological: She is alert and oriented to person, place, and time. Coordination normal.  Skin: Skin is warm and dry. No rash noted. No erythema.  Psychiatric: She has a normal mood and affect. Her behavior is normal. Judgment and thought content normal.

## 2015-01-03 NOTE — Assessment & Plan Note (Signed)
Bradycardia has improved with holding digoxin, decreased amiodarone and metoprolol We'll continue her current medications. She will call to clarify her amiodarone dose

## 2015-01-05 ENCOUNTER — Telehealth: Payer: Self-pay | Admitting: *Deleted

## 2015-01-05 NOTE — Telephone Encounter (Signed)
Pt calling to let us know she is taking 200 mg of Amiodarone  We didn't have it written down in her charts she is just letting us know.

## 2015-01-05 NOTE — Telephone Encounter (Signed)
Would continue amiodarone

## 2015-01-05 NOTE — Telephone Encounter (Signed)
Is she supposed to be taking amiodarone daily or is still to be holding it?

## 2015-01-06 NOTE — Telephone Encounter (Signed)
Spoke w/ pt's husband.  She states that pt is still sleeping and he is unsure of what she takes.  Asked him to have pt call back and let us know that she is indeed taking this.

## 2015-01-06 NOTE — Telephone Encounter (Signed)
Spoke w/ pt.  She reports that she is taking amiodarone 200 mg daily.

## 2015-01-09 ENCOUNTER — Other Ambulatory Visit: Payer: Self-pay | Admitting: *Deleted

## 2015-01-09 ENCOUNTER — Telehealth: Payer: Self-pay | Admitting: Cardiovascular Disease

## 2015-01-09 MED ORDER — AMIODARONE HCL 200 MG PO TABS: 200.0000 mg | ORAL_TABLET | Freq: Every day | ORAL | Status: DC

## 2015-01-09 MED ORDER — METOPROLOL TARTRATE 25 MG PO TABS: 12.5000 mg | ORAL_TABLET | Freq: Two times a day (BID) | ORAL | Status: DC

## 2015-01-09 NOTE — Telephone Encounter (Signed)
°  1. Which medications need to be refilled? Metoprolol 25 mg po 1/2 tab twice daily and     Amiodarone HCL 200 mg po 1/2 tab once daily   2. Which pharmacy is medication to be sent to?  [facility]  Mail order  3. Do they need a 30 day or 90 day supply? 90   4. Would they like a call back once the medication has been sent to the pharmacy? Call  If issues with refill

## 2015-01-25 ENCOUNTER — Ambulatory Visit: Payer: Self-pay | Admitting: Cardiovascular Disease

## 2015-01-30 ENCOUNTER — Other Ambulatory Visit (INDEPENDENT_AMBULATORY_CARE_PROVIDER_SITE_OTHER): Payer: Medicare PPO

## 2015-01-30 ENCOUNTER — Telehealth: Payer: Self-pay | Admitting: Family Medicine

## 2015-01-30 DIAGNOSIS — E782 Mixed hyperlipidemia: Secondary | ICD-10-CM

## 2015-01-30 LAB — COMPREHENSIVE METABOLIC PANEL
ALT: 24 U/L (ref 0–35)
AST: 21 U/L (ref 0–37)
Albumin: 3.8 g/dL (ref 3.5–5.2)
Alkaline Phosphatase: 71 U/L (ref 39–117)
BILIRUBIN TOTAL: 0.5 mg/dL (ref 0.2–1.2)
BUN: 23 mg/dL (ref 6–23)
CO2: 28 meq/L (ref 19–32)
CREATININE: 0.79 mg/dL (ref 0.40–1.20)
Calcium: 9.3 mg/dL (ref 8.4–10.5)
Chloride: 105 mEq/L (ref 96–112)
GFR: 75.97 mL/min (ref >60.00)
GLUCOSE: 86 mg/dL (ref 70–99)
Potassium: 3.9 mEq/L (ref 3.5–5.1)
SODIUM: 141 meq/L (ref 135–145)
TOTAL PROTEIN: 6.8 g/dL (ref 6.0–8.3)

## 2015-01-30 LAB — LIPID PANEL
Cholesterol: 264 mg/dL — ABNORMAL HIGH (ref 0–200)
HDL: 63 mg/dL (ref >39.00)
LDL Cholesterol: 179 mg/dL — ABNORMAL HIGH (ref 0–99)
NonHDL: 200.61
TRIGLYCERIDES: 109 mg/dL (ref 0.0–149.0)
Total CHOL/HDL Ratio: 4
VLDL: 21.8 mg/dL (ref 0.0–40.0)

## 2015-01-30 NOTE — Telephone Encounter (Signed)
-----   Message from Ellamae Sia sent at 01/24/2015  2:27 PM EDT ----- Regarding: Lab orders for Monday, 10.31.16 Patient is scheduled for CPX labs, please order future labs, Thanks , Karna Christmas

## 2015-02-02 ENCOUNTER — Ambulatory Visit (INDEPENDENT_AMBULATORY_CARE_PROVIDER_SITE_OTHER): Payer: Medicare PPO | Admitting: Family Medicine

## 2015-02-02 ENCOUNTER — Encounter: Payer: Self-pay | Admitting: Family Medicine

## 2015-02-02 VITALS — BP 146/78 | HR 59 | Temp 97.7°F | Ht 65.5 in | Wt 129.8 lb

## 2015-02-02 DIAGNOSIS — Z0000 Encounter for general adult medical examination without abnormal findings: Secondary | ICD-10-CM | POA: Diagnosis not present

## 2015-02-02 DIAGNOSIS — Z7189 Other specified counseling: Secondary | ICD-10-CM

## 2015-02-02 DIAGNOSIS — I10 Essential (primary) hypertension: Secondary | ICD-10-CM

## 2015-02-02 DIAGNOSIS — Z1211 Encounter for screening for malignant neoplasm of colon: Secondary | ICD-10-CM

## 2015-02-02 DIAGNOSIS — E782 Mixed hyperlipidemia: Secondary | ICD-10-CM

## 2015-02-02 NOTE — Progress Notes (Signed)
Pre visit review using our clinic review tool, if applicable. No additional management support is needed unless otherwise documented below in the visit note. 

## 2015-02-02 NOTE — Assessment & Plan Note (Signed)
White coat element, has been improved at recent OV with cards on current meds.

## 2015-02-02 NOTE — Patient Instructions (Addendum)
Get back to regular exercise and low cholesterol diet. Decrease cheese, cream, butter, sausage in diet. Call to schedule mamogram on your own. Call our office if interested in bone density.  Stop  At lab out to pick up ifob test.

## 2015-02-02 NOTE — Progress Notes (Signed)
HPI  I have personally reviewed the Medicare Annual Wellness questionnaire and have noted 1. The patient's medical and social history 2. Their use of alcohol, tobacco or illicit drugs 3. Their current medications and supplements 4. The patient's functional ability including ADL's, fall risks, home safety risks and hearing or visual             impairment. 5. Diet and physical activities 6. Evidence for depression or mood disorders 7.         Updated provider list Cognitive evaluation was performed and recorded on pt medicare questionnaire form. The patients weight, height, BMI and visual acuity have been recorded in the chart  I have made referrals, counseling and provided education to the patient based review of the above and I have provided the pt with a written personalized care plan for preventive services.    12/2014 CHF:  Followed by Dr. Rockey Situ. Hospitalization July 5 with discharge 10/20/2014 for atrial fibrillation, discharged on amiodarone, anticoagulation (Eliquis), digoxin ( now off), metoprolol.   Had SE to lasix of  Burning,  She has stopped now. She has swelling in legs at end of day, but tolerating. She is using  Dandelion for this instead. She feels it helps as much.  Atrophic vaginitis:  Now improved on estrace vaginal cream.  Rheumatoid arthritis: tolerable, not interesteds in meds other than natural meds for this. Seeing Dr. Jefm Bryant in past, not routinely. Maintained with naprosyn.  HTN: previously welll controlled on amlodipine. She feels that her BP is up due to her rushing here and feeling anxious. BP Readings from Last 3 Encounters:  02/02/15 146/78  01/03/15 134/70  12/23/14 192/73  Using medication without problems or lightheadedness: None Chest pain with exertion:None Edema: yes Short of breath:None Average home BPs: not checking  Other issues:.    High cholesterol.Marland KitchenMarland KitchenImproving but inadequate  control, Goal < 130 Using coQ 10. Squid oil. Lab Results   Component Value Date   CHOL 264* 01/30/2015   HDL 63.00 01/30/2015   LDLCALC 179* 01/30/2015   LDLDIRECT 206.0 03/08/2013   TRIG 109.0 01/30/2015   CHOLHDL 4 01/30/2015  She eats very low fat diet, getting back to exercsie.  She has not tolerated red yeast rice well. Not interested in statin meds.    History   Social History  . Marital Status: Married    Spouse Name: N/A    Number of Children: 2  . Years of Education: N/A   Occupational History  . retired Network engineer    Social History Main Topics  . Smoking status: Never Smoker   . Smokeless tobacco: Never Used  . Alcohol Use: None  . Drug Use: None  . Sexual Activity: None   Other Topics Concern  . None   Social History Narrative   Exercise:walking 2-3 times a week   End of life issues: Has living will.. Husband is HCPOA. Full code (reviewed 2016)   Review of Systems  Constitutional: Negative for fever and fatigue.  HENT: Negative for ear pain.  Eyes: Negative for pain.  Respiratory: Negative for chest tightness and shortness of breath.  Cardiovascular: Negative for chest pain and leg swelling.  Gastrointestinal: Negative for abdominal pain.  Genitourinary: Negative for dysuria.  Musculoskeletal: Positive for back pain. Negative for joint swelling and gait problem.  Objective:   Physical Exam  Constitutional: Vital signs are normal. She appears well-developed and well-nourished. She is cooperative. Non-toxic appearance. She does not appear ill. No distress.  HENT:  Head: Normocephalic.  Right Ear: Hearing, tympanic membrane, external ear and ear canal normal.  Left Ear: Hearing, tympanic membrane, external ear and ear canal normal.  Nose: Nose normal.  Eyes: Conjunctivae, EOM and lids are normal. Pupils are equal, round, and reactive to light. No foreign bodies found.  Neck: Trachea normal and normal range of motion. Neck supple.  Carotid bruit is not present. No mass and no thyromegaly present.  Cardiovascular: Normal rate, regular rhythm, S1 normal, S2 normal, normal heart sounds and intact distal pulses. Exam reveals no gallop.  No murmur heard.  Pulmonary/Chest: Effort normal and breath sounds normal. No respiratory distress. She has no wheezes. She has no rhonchi. She has no rales.  Abdominal: Soft. Normal appearance and bowel sounds are normal. She exhibits no distension, no fluid wave, no abdominal bruit and no mass. There is no hepatosplenomegaly. There is no tenderness. There is no rebound, no guarding and no CVA tenderness. No hernia.  Genitourinary: not indicated Breast exam: no breast masses or nipple changes Bilaterally. Lymphadenopathy:  She has no cervical adenopathy.  She has no axillary adenopathy.  Neurological: She is alert. She has normal strength. No cranial nerve deficit or sensory deficit.  Skin: Skin is warm, dry and intact. No rash noted.  Psychiatric: Her speech is normal and behavior is normal. Judgment normal. Her mood appears not anxious. Cognition and memory are normal. She does not exhibit a depressed mood.  Assessment & Plan:   Complete Physical Exam:  The patient's preventative maintenance and recommended screening tests for an annual wellness exam were reviewed in full today.  Brought up to date unless services declined.  Counselled on the importance of diet, exercise, and its role in overall health and mortality.  The patient's FH and SH was reviewed, including their home life, tobacco status, and drug and alcohol status.    DEXA done in 2010.Marland Kitchen Not interested in repeating yet.  Vaccines:Due for PNA and shingles. SE to flu vaccine in past. She is not interested. Due for mammogram,never went last year, not interested in repeating. DVE/pap: not indicated, s/p hysterectomy Colon:never sent back ifob 2014, 2015 wants to do this year

## 2015-02-02 NOTE — Assessment & Plan Note (Signed)
Improved but inadequate control. Pot refuses statin or other prescription med, wishes to treat with supplements. Encouraged exercise, weight loss, healthy eating habits. Info on low chol diet reveiwed.

## 2015-03-13 ENCOUNTER — Encounter: Payer: Self-pay | Admitting: Family

## 2015-03-13 ENCOUNTER — Ambulatory Visit: Payer: Medicare PPO | Attending: Family | Admitting: Family

## 2015-03-13 VITALS — BP 152/70 | HR 62 | Resp 18 | Ht 65.0 in | Wt 131.0 lb

## 2015-03-13 DIAGNOSIS — I5022 Chronic systolic (congestive) heart failure: Secondary | ICD-10-CM | POA: Diagnosis not present

## 2015-03-13 DIAGNOSIS — Z809 Family history of malignant neoplasm, unspecified: Secondary | ICD-10-CM | POA: Diagnosis not present

## 2015-03-13 DIAGNOSIS — M1990 Unspecified osteoarthritis, unspecified site: Secondary | ICD-10-CM | POA: Diagnosis not present

## 2015-03-13 DIAGNOSIS — F419 Anxiety disorder, unspecified: Secondary | ICD-10-CM | POA: Diagnosis not present

## 2015-03-13 DIAGNOSIS — I10 Essential (primary) hypertension: Secondary | ICD-10-CM | POA: Insufficient documentation

## 2015-03-13 DIAGNOSIS — E785 Hyperlipidemia, unspecified: Secondary | ICD-10-CM | POA: Insufficient documentation

## 2015-03-13 DIAGNOSIS — Z833 Family history of diabetes mellitus: Secondary | ICD-10-CM | POA: Insufficient documentation

## 2015-03-13 DIAGNOSIS — R011 Cardiac murmur, unspecified: Secondary | ICD-10-CM | POA: Insufficient documentation

## 2015-03-13 DIAGNOSIS — I480 Paroxysmal atrial fibrillation: Secondary | ICD-10-CM

## 2015-03-13 DIAGNOSIS — I4891 Unspecified atrial fibrillation: Secondary | ICD-10-CM | POA: Diagnosis not present

## 2015-03-13 DIAGNOSIS — Z8249 Family history of ischemic heart disease and other diseases of the circulatory system: Secondary | ICD-10-CM | POA: Insufficient documentation

## 2015-03-13 DIAGNOSIS — R001 Bradycardia, unspecified: Secondary | ICD-10-CM

## 2015-03-13 DIAGNOSIS — I509 Heart failure, unspecified: Secondary | ICD-10-CM | POA: Diagnosis present

## 2015-03-13 NOTE — Progress Notes (Signed)
Subjective:    Patient ID: Karen Bowman, female    DOB: Dec 20, 1940, 74 y.o.   MRN: [id number]  Congestive Heart Failure Presents for follow-up visit. The disease course has been stable. Associated symptoms include abdominal pain (rarely), edema (right ankle) and fatigue. Pertinent negatives include no chest pain, orthopnea, palpitations, shortness of breath or unexpected weight change. The symptoms have been stable. Past treatments include beta blockers and salt and fluid restriction. The treatment provided significant relief. Compliance with prior treatments has been good. Her past medical history is significant for arrhythmia and HTN. She has one 1st degree relative with heart disease. Compliance with total regimen is 76-100%.  Hypertension This is a chronic problem. The current episode started more than 1 year ago. The problem has been waxing and waning since onset. Associated symptoms include headaches (mild) and peripheral edema. Pertinent negatives include no chest pain, palpitations, PND or shortness of breath. Agents associated with hypertension include decongestants (sudafed on occasion). Risk factors for coronary artery disease include dyslipidemia and post-menopausal state. Past treatments include beta blockers, diuretics, lifestyle changes and angiotensin blockers. The current treatment provides moderate improvement. Compliance problems include medication side effects.  Hypertensive end-organ damage includes heart failure.    Past Medical History  Diagnosis Date  . Hypertension   . Heart murmur   . Anxiety   . Arrhythmia   . Arthritis   . Atrial fibrillation ([place])   . Acute heart failure ([place])   . Hyperlipemia     Past Surgical History  Procedure Laterality Date  . Tonsillectomy    . Appendectomy    . Tumor removal      right arm  . Bladder suspension    . Abdominal hysterectomy    . Tee without cardioversion N/A 10/18/2014    Procedure: TRANSESOPHAGEAL ECHOCARDIOGRAM  (TEE);  Surgeon: Thayer Headings, MD;  Location: [facility] ORS;  Service: Cardiovascular;  Laterality: N/A;  . Electrophysiologic study N/A 10/18/2014    Procedure: CARDIOVERSION;  Surgeon: Thayer Headings, MD;  Location: [facility] ORS;  Service: Cardiovascular;  Laterality: N/A;    Family History  Problem Relation Age of Onset  . Osteoporosis Mother   . Heart disease Mother   . Cancer Father   . Diabetes Maternal Aunt     Social History  Substance Use Topics  . Smoking status: Never Smoker   . Smokeless tobacco: Never Used  . Alcohol Use: No    Allergies  Allergen Reactions  . Ciprofloxacin     REACTION: dizzy  . Ciprofloxacin Nausea And Vomiting  . Hydrochlorothiazide Nausea And Vomiting  . Hydrochlorothiazide W-Triamterene     REACTION: nausea  . Sulfa Antibiotics Nausea And Vomiting  . Sulfonamide Derivatives     REACTION: nausea \\T \ vomiting  . Valsartan     REACTION: angioedema - probable  . Valsartan Nausea And Vomiting    Prior to Admission medications   Medication Sig Start Date End Date Taking? Authorizing Provider  amiodarone (PACERONE) 200 MG tablet Take 1 tablet (200 mg total) by mouth daily. Patient taking differently: Take 100 mg by mouth daily.  01/09/15  Yes Minna Merritts, MD  apixaban (ELIQUIS) 5 MG TABS tablet Take 1 tablet (5 mg total) by mouth 2 (two) times daily. 12/09/14  Yes Minna Merritts, MD  BEE POLLEN PO Take 2 tablets by mouth daily.    Yes Historical Provider, MD  betamethasone dipropionate (DIPROLENE) 0.05 % cream Apply topically 2 (two) times daily. 02/01/14  Yes  Amy Cletis [place], MD  CINNAMON PO Take 2 tablets by mouth daily.    Yes Historical Provider, MD  Coenzyme Q10-Fish Oil-Vit E (CO-Q 10 OMEGA-3 FISH OIL) CAPS Take 1 capsule by mouth 2 (two) times daily.   Yes Historical Provider, MD  CRANBERRY PO Take 1 tablet by mouth 2 (two) times daily.    Yes Historical Provider, MD  DANDELION PO Take 1 tablet by mouth 2 (two) times daily.   Yes Historical  Provider, MD  estradiol (ESTRACE) 0.1 MG/GM vaginal cream Place 1 Applicatorful vaginally once a week. 12/23/14  Yes Alexis Frock, MD  EYEBRIGHT PO Take 1 tablet by mouth daily.   Yes Historical Provider, MD  Hyaluronic Acid-Vitamin C (HYALURONIC ACID PO) Take 2 tablets by mouth daily.   Yes Historical Provider, MD  L-GLUTAMINE PO Take 1 tablet by mouth 2 (two) times daily.    Yes Historical Provider, MD  metoprolol tartrate (LOPRESSOR) 25 MG tablet Take 0.5 tablets (12.5 mg total) by mouth 2 (two) times daily. 01/09/15  Yes Minna Merritts, MD  MILK THISTLE PO Take 1 tablet by mouth daily.    Yes Historical Provider, MD  MISC NATURAL PRODUCTS PO Take 1 tablet by mouth daily. Advanced BP Support-Dr.Sinatra ([place]'s #1 integrated cardiologist)   Yes Historical Provider, MD  MISC NATURAL PRODUCTS PO Take 1 tablet by mouth daily. Multivitamin-Dr. Talmage Coin   Yes Historical Provider, MD  MISC NATURAL PRODUCTS PO Take 4 tablets by mouth daily. Ong   Yes Historical Provider, MD  MISC NATURAL PRODUCTS PO Take 1 tablet by mouth daily. Ashwagandha   Yes Historical Provider, MD  MISC NATURAL PRODUCTS PO Take 5 tablets by mouth daily. Bone-Up by Gus Height   Yes Historical Provider, MD  MISC NATURAL PRODUCTS PO Advanced Glucosamine Chondroitin-MSM Natures Plus Ultra Rx Joint Liquid 1/2 capful per day   Yes Historical Provider, MD  MISC NATURAL PRODUCTS PO Take 2 tablets by mouth daily. Curcumin   Yes Historical Provider, MD  OVER THE COUNTER MEDICATION Take 2 tablets by mouth daily. Omega Q Plus w/resveratrol-Dr. Talmage Coin   Yes Historical Provider, MD  Probiotic CAPS Take 2 capsules by mouth daily.   Yes Historical Provider, MD  Probiotic Product (PROBIOTIC DAILY PO) Dr. Gertie Exon Probiotics one tablet daily   Yes Historical Provider, MD  QUERCETIN PO Take 1-2 tablets by mouth daily. w/bromelain   Yes Historical Provider, MD  ROYAL JELLY PO Take 1 tablet by mouth daily.   Yes Historical  Provider, MD  RUTIN PO Take 1 capsule by mouth 2 (two) times daily.   Yes Historical Provider, MD  Cholecalciferol 1000 UNITS capsule Take 2,000 Units by mouth daily.     Historical Provider, MD     Review of Systems  Constitutional: Positive for fatigue. Negative for appetite change and unexpected weight change.  HENT: Positive for congestion. Negative for rhinorrhea and sore throat.   Eyes: Negative.   Respiratory: Negative for cough, chest tightness, shortness of breath and wheezing.   Cardiovascular: Positive for leg swelling. Negative for chest pain, palpitations and PND.  Gastrointestinal: Positive for abdominal pain (rarely) and constipation (on occasion). Negative for abdominal distention.  Endocrine: Negative.   Genitourinary: Negative.   Musculoskeletal: Positive for back pain (on occasion) and neck stiffness.  Skin: Negative.   Allergic/Immunologic: Positive for environmental allergies. Negative for immunocompromised state.  Neurological: Positive for light-headedness (only when constipated) and headaches (mild). Negative for dizziness.  Hematological: Negative for adenopathy. Does not bruise/bleed easily.  Psychiatric/Behavioral: Negative for sleep disturbance (sleeping on 2 pillows) and dysphoric mood. The patient is not nervous/anxious.        Objective:   Physical Exam  Constitutional: She is oriented to person, place, and time. She appears well-developed and well-nourished.  HENT:  Head: Normocephalic and atraumatic.  Eyes: Conjunctivae are normal. Pupils are equal, round, and reactive to light.  Neck: Normal range of motion. Neck supple.  Cardiovascular: Regular rhythm.  Bradycardia present.   Pulmonary/Chest: Effort normal. She has no wheezes. She has no rales.  Abdominal: Soft. She exhibits no distension. There is no tenderness.  Musculoskeletal: She exhibits edema (trace amount around right ankle). She exhibits no tenderness.  Neurological: She is alert and  oriented to person, place, and time.  Skin: Skin is warm and dry.  Psychiatric: She has a normal mood and affect. Her behavior is normal. Thought content normal.  Nursing note and vitals reviewed.   BP 152/70 mmHg  Pulse 62  Resp 18  Ht 5\' 5"  (1.651 m)  Wt 131 lb (59.421 kg)  BMI 21.80 kg/m2  SpO2 100%  LMP  (LMP Unknown)       Assessment & Plan:  1: Chronic heart failure with reduced ejection fraction- Patient presents with fatigue upon exertion but she doesn't feel like it's interfering with her activities. She feels like she is sleeping well and does sleep on 2 pillows. She continues to weigh herself and reports a stable weight. Weight is unchanged from her previous visit. She is not adding any salt to her food and she doesn't cook with salt either. Does try to limit processed food as well. She does have some swelling around her right ankle but says that it goes down overnight. She is no longer taking furosemide and is using dandelion instead and feels better with making that change. Since she has had probable angioedema with ARB will not attempt the use of an ACE-i.  2: HTN- Blood pressure better on recheck. Continues to take multiple supplements. 3: Atrial fibrillation- Currently rate controlled at this time on amiodarone and metoprolol. Follows with cardiology regarding this. 4: Bradycardia- Heart rate has improved.   Return here in 6 months or sooner for any questions/problems before then.

## 2015-03-13 NOTE — Patient Instructions (Signed)
Continue weighing daily and call for an overnight weight gain of > 2 pounds or a weekly weight gain of >5 pounds. 

## 2015-04-19 ENCOUNTER — Telehealth: Payer: Self-pay

## 2015-04-19 NOTE — Telephone Encounter (Signed)
Pt left v/m requesting 3 month rx for amlodipine 2.5 mg sent to [facility]. Amlodipine is not on current med list but is on hx med list as d/c due to change in therapy by Dr Rockey Situ. Tried to contact pt but pts husband said she is resting and he will have her cb but not sure when she will cb. Sending note to Dr Diona Browner.

## 2015-04-19 NOTE — Telephone Encounter (Signed)
Pt left v/m; pt has been taking amlodipine a long time and pt is not aware was told to stop amlodipine at any time. Pt request 3 month refill to [facility]. Pt request cb. Note forwarded to Dr Diona Browner.

## 2015-04-21 NOTE — Telephone Encounter (Signed)
Will forward to Dr Gollan  

## 2015-04-24 ENCOUNTER — Other Ambulatory Visit: Payer: Self-pay | Admitting: Cardiovascular Disease

## 2015-04-24 MED ORDER — AMLODIPINE BESYLATE 2.5 MG PO TABS: 2.5000 mg | ORAL_TABLET | Freq: Every day | ORAL | Status: DC

## 2015-04-24 NOTE — Telephone Encounter (Signed)
Amlodipine refill sent in

## 2015-05-02 ENCOUNTER — Telehealth: Payer: Self-pay | Admitting: Cardiovascular Disease

## 2015-05-02 NOTE — Telephone Encounter (Signed)
Pt c/o medication issue:  1. Name of Medication: Amlodipine   2. How are you currently taking this medication (dosage and times per day)? 2.5 mg po daily   3. Are you having a reaction (difficulty breathing--STAT)? none  4. What is your medication issue? Wants to know if she is still suppose to take this medicine PCP was concerned that she may not need it please call to clarify

## 2015-06-27 ENCOUNTER — Inpatient Hospital Stay (HOSPITAL_COMMUNITY)
Admission: EM | Admit: 2015-06-27 | Discharge: 2015-07-03 | DRG: 065 | Disposition: A | Discharge status: Rehabilitation Facility | Payer: Medicare PPO | Attending: Internal Medicine | Admitting: Internal Medicine

## 2015-06-27 ENCOUNTER — Emergency Department (HOSPITAL_COMMUNITY): Payer: Medicare PPO

## 2015-06-27 DIAGNOSIS — I6340 Cerebral infarction due to embolism of unspecified cerebral artery: Principal | ICD-10-CM | POA: Diagnosis present

## 2015-06-27 DIAGNOSIS — I6350 Cerebral infarction due to unspecified occlusion or stenosis of unspecified cerebral artery: Secondary | ICD-10-CM

## 2015-06-27 DIAGNOSIS — R471 Dysarthria and anarthria: Secondary | ICD-10-CM | POA: Diagnosis present

## 2015-06-27 DIAGNOSIS — I69398 Other sequelae of cerebral infarction: Secondary | ICD-10-CM | POA: Diagnosis not present

## 2015-06-27 DIAGNOSIS — I63231 Cerebral infarction due to unspecified occlusion or stenosis of right carotid arteries: Secondary | ICD-10-CM | POA: Diagnosis not present

## 2015-06-27 DIAGNOSIS — I110 Hypertensive heart disease with heart failure: Secondary | ICD-10-CM | POA: Diagnosis not present

## 2015-06-27 DIAGNOSIS — E782 Mixed hyperlipidemia: Secondary | ICD-10-CM | POA: Diagnosis not present

## 2015-06-27 DIAGNOSIS — R270 Ataxia, unspecified: Secondary | ICD-10-CM | POA: Diagnosis not present

## 2015-06-27 DIAGNOSIS — R29810 Facial weakness: Secondary | ICD-10-CM | POA: Diagnosis not present

## 2015-06-27 DIAGNOSIS — Z7901 Long term (current) use of anticoagulants: Secondary | ICD-10-CM | POA: Diagnosis not present

## 2015-06-27 DIAGNOSIS — R4781 Slurred speech: Secondary | ICD-10-CM | POA: Diagnosis not present

## 2015-06-27 DIAGNOSIS — I63512 Cerebral infarction due to unspecified occlusion or stenosis of left middle cerebral artery: Secondary | ICD-10-CM | POA: Diagnosis not present

## 2015-06-27 DIAGNOSIS — I6339 Cerebral infarction due to thrombosis of other cerebral artery: Secondary | ICD-10-CM | POA: Diagnosis not present

## 2015-06-27 DIAGNOSIS — R29818 Other symptoms and signs involving the nervous system: Secondary | ICD-10-CM | POA: Diagnosis not present

## 2015-06-27 DIAGNOSIS — Z881 Allergy status to other antibiotic agents status: Secondary | ICD-10-CM

## 2015-06-27 DIAGNOSIS — Z8249 Family history of ischemic heart disease and other diseases of the circulatory system: Secondary | ICD-10-CM

## 2015-06-27 DIAGNOSIS — G8191 Hemiplegia, unspecified affecting right dominant side: Secondary | ICD-10-CM | POA: Diagnosis present

## 2015-06-27 DIAGNOSIS — M6249 Contracture of muscle, multiple sites: Secondary | ICD-10-CM | POA: Diagnosis not present

## 2015-06-27 DIAGNOSIS — I639 Cerebral infarction, unspecified: Secondary | ICD-10-CM

## 2015-06-27 DIAGNOSIS — I63531 Cerebral infarction due to unspecified occlusion or stenosis of right posterior cerebral artery: Secondary | ICD-10-CM | POA: Diagnosis not present

## 2015-06-27 DIAGNOSIS — E785 Hyperlipidemia, unspecified: Secondary | ICD-10-CM | POA: Diagnosis present

## 2015-06-27 DIAGNOSIS — Z888 Allergy status to other drugs, medicaments and biological substances status: Secondary | ICD-10-CM | POA: Diagnosis not present

## 2015-06-27 DIAGNOSIS — M791 Myalgia: Secondary | ICD-10-CM | POA: Diagnosis not present

## 2015-06-27 DIAGNOSIS — Z79899 Other long term (current) drug therapy: Secondary | ICD-10-CM | POA: Diagnosis not present

## 2015-06-27 DIAGNOSIS — Z90710 Acquired absence of both cervix and uterus: Secondary | ICD-10-CM | POA: Diagnosis not present

## 2015-06-27 DIAGNOSIS — I482 Chronic atrial fibrillation: Secondary | ICD-10-CM | POA: Diagnosis not present

## 2015-06-27 DIAGNOSIS — I480 Paroxysmal atrial fibrillation: Secondary | ICD-10-CM | POA: Diagnosis present

## 2015-06-27 DIAGNOSIS — I6789 Other cerebrovascular disease: Secondary | ICD-10-CM | POA: Diagnosis not present

## 2015-06-27 DIAGNOSIS — F419 Anxiety disorder, unspecified: Secondary | ICD-10-CM | POA: Diagnosis not present

## 2015-06-27 DIAGNOSIS — Z882 Allergy status to sulfonamides status: Secondary | ICD-10-CM

## 2015-06-27 DIAGNOSIS — I5042 Chronic combined systolic (congestive) and diastolic (congestive) heart failure: Secondary | ICD-10-CM | POA: Diagnosis not present

## 2015-06-27 DIAGNOSIS — I10 Essential (primary) hypertension: Secondary | ICD-10-CM | POA: Diagnosis present

## 2015-06-27 DIAGNOSIS — I69351 Hemiplegia and hemiparesis following cerebral infarction affecting right dominant side: Secondary | ICD-10-CM | POA: Diagnosis not present

## 2015-06-27 DIAGNOSIS — I5022 Chronic systolic (congestive) heart failure: Secondary | ICD-10-CM | POA: Diagnosis not present

## 2015-06-27 DIAGNOSIS — I69359 Hemiplegia and hemiparesis following cerebral infarction affecting unspecified side: Secondary | ICD-10-CM | POA: Diagnosis not present

## 2015-06-27 DIAGNOSIS — R531 Weakness: Secondary | ICD-10-CM | POA: Diagnosis not present

## 2015-06-27 LAB — PROTIME-INR
INR: 1.22 (ref 0.00–1.49)
PROTHROMBIN TIME: 15.6 s — AB (ref 11.6–15.2)

## 2015-06-27 LAB — I-STAT CHEM 8, ED
BUN: 19 mg/dL (ref 6–20)
Calcium, Ion: 1.08 mmol/L — ABNORMAL LOW (ref 1.13–1.30)
Chloride: 101 mmol/L (ref 101–111)
Creatinine, Ser: 0.7 mg/dL (ref 0.44–1.00)
Glucose, Bld: 118 mg/dL — ABNORMAL HIGH (ref 65–99)
HEMATOCRIT: 47 % — AB (ref 36.0–46.0)
Hemoglobin: 16 g/dL — ABNORMAL HIGH (ref 12.0–15.0)
Potassium: 3.6 mmol/L (ref 3.5–5.1)
Sodium: 138 mmol/L (ref 135–145)
TCO2: 25 mmol/L (ref 0–100)

## 2015-06-27 LAB — COMPREHENSIVE METABOLIC PANEL
ALBUMIN: 3.9 g/dL (ref 3.5–5.0)
ALK PHOS: 65 U/L (ref 38–126)
ALT: 22 U/L (ref 14–54)
ANION GAP: 12 (ref 5–15)
AST: 30 U/L (ref 15–41)
BUN: 14 mg/dL (ref 6–20)
CO2: 23 mmol/L (ref 22–32)
Calcium: 9.4 mg/dL (ref 8.9–10.3)
Chloride: 104 mmol/L (ref 101–111)
Creatinine, Ser: 0.78 mg/dL (ref 0.44–1.00)
GFR calc Af Amer: >60 mL/min (ref >60)
GFR calc non Af Amer: >60 mL/min (ref >60)
GLUCOSE: 125 mg/dL — AB (ref 65–99)
POTASSIUM: 3.7 mmol/L (ref 3.5–5.1)
SODIUM: 139 mmol/L (ref 135–145)
Total Bilirubin: 0.5 mg/dL (ref 0.3–1.2)
Total Protein: 6.5 g/dL (ref 6.5–8.1)

## 2015-06-27 LAB — CBC
HCT: 42.6 % (ref 36.0–46.0)
Hemoglobin: 14 g/dL (ref 12.0–15.0)
MCH: 31.6 pg (ref 26.0–34.0)
MCHC: 32.9 g/dL (ref 30.0–36.0)
MCV: 96.2 fL (ref 78.0–100.0)
PLATELETS: 182 10*3/uL (ref 150–400)
RBC: 4.43 MIL/uL (ref 3.87–5.11)
RDW: 12.6 % (ref 11.5–15.5)
WBC: 5.9 10*3/uL (ref 4.0–10.5)

## 2015-06-27 LAB — DIFFERENTIAL
BASOS ABS: 0 10*3/uL (ref 0.0–0.1)
Basophils Relative: 1 %
EOS ABS: 0 10*3/uL (ref 0.0–0.7)
EOS PCT: 0 %
LYMPHS PCT: 16 %
Lymphs Abs: 0.9 10*3/uL (ref 0.7–4.0)
Monocytes Absolute: 0.4 10*3/uL (ref 0.1–1.0)
Monocytes Relative: 7 %
NEUTROS PCT: 76 %
Neutro Abs: 4.5 10*3/uL (ref 1.7–7.7)

## 2015-06-27 LAB — CBG MONITORING, ED: GLUCOSE-CAPILLARY: 103 mg/dL — AB (ref 65–99)

## 2015-06-27 LAB — ETHANOL: Alcohol, Ethyl (B): <5 mg/dL (ref <5)

## 2015-06-27 LAB — I-STAT TROPONIN, ED: Troponin i, poc: 0.01 ng/mL (ref 0.00–0.08)

## 2015-06-27 LAB — APTT: APTT: 36 s (ref 24–37)

## 2015-06-27 NOTE — ED Notes (Signed)
Attempted report 

## 2015-06-27 NOTE — ED Notes (Signed)
Pt arrives via EMS from home as a code stroke R sided weakness, slurred speech and abnormal gait. Alertx4.

## 2015-06-27 NOTE — Consult Note (Signed)
Neurology Consultation Reason for Consult: Stroke Referring Physician: Tyrone Nine, M.D.  CC: Sided weakness  History is obtained from: Patient  HPI: Karen Bowman is a 75 y.o. female with a history of atrial fibrillation on apixaban who presents with right-sided weakness that started around 4:00. This seemed to get slightly worse and therefore she decided to seek therapy and called 911. EMS noted that she was dragging her leg and aggravated a code stroke en route. She denies missing any doses of her apixaban. She denies any sensory change. Denies any speaking change, visual change, other symptoms.   LKW: 4 PM tpa given?: no, anticoagulated    ROS: A 14 point ROS was performed and is negative except as noted in the HPI.  Past Medical History  Diagnosis Date  . Hypertension   . Heart murmur   . Anxiety   . Arrhythmia   . Arthritis   . Atrial fibrillation ([place])   . Acute heart failure ([place])   . Hyperlipemia      Family History  Problem Relation Age of Onset  . Osteoporosis Mother   . Heart disease Mother   . Cancer Father   . Diabetes Maternal Aunt      Social History:  reports that she has never smoked. She has never used smokeless tobacco. She reports that she does not drink alcohol or use illicit drugs.   Exam: Current vital signs: BP 128/78 mmHg  Pulse 67  Temp(Src) 98.3 F (36.8 C) (Oral)  Resp 16  Ht 5' 5.5" (1.664 m)  Wt 57.153 kg (126 lb)  BMI 20.64 kg/m2  SpO2 97%  LMP  (LMP Unknown) Vital signs in last 24 hours: Temp:  [98.3 F (36.8 C)] 98.3 F (36.8 C) (03/28 2216) Pulse Rate:  [65-67] 67 (03/28 2240) Resp:  [12-16] 16 (03/28 2240) BP: (128-144)/(78-92) 128/78 mmHg (03/28 2240) SpO2:  [96 %-97 %] 97 % (03/28 2240) Weight:  [57.153 kg (126 lb)] 57.153 kg (126 lb) (03/28 2216)   Physical Exam  Constitutional: Appears well-developed and well-nourished.  Psych: Affect appropriate to situation Eyes: No scleral injection HENT: No OP  obstrucion Head: Normocephalic.  Cardiovascular: Normal rate and regular rhythm.  Respiratory: Effort normal and breath sounds normal to anterior ascultation GI: Soft.  No distension. There is no tenderness.  Skin: WDI  Neuro: Mental Status: Patient is awake, alert, oriented to person, place, month, year, and situation. Patient is able to give a clear and coherent history. No signs of aphasia or neglect Cranial Nerves: II: Visual Fields are full. Pupils are equal, round, and reactive to light.   III,IV, VI: EOMI without ptosis or diploplia.  V: Facial sensation is symmetric to temperature VII: Facial movement is symmetric.  VIII: hearing is intact to voice X: Uvula elevates symmetrically XI: Shoulder shrug is symmetric. XII: tongue is midline without atrophy or fasciculations.  Motor: Tone is normal. Bulk is normal. 5/5 strength was present on the left, she has mild 4/5 weakness in the right arm and leg, though her fine motor is disproportionately affected compared to her gross strength Sensory: Sensation is symmetric to light touch and temperature in the arms and legs. Cerebellar: FNF and HKS are intact on the left, heel-knee-shin is consistent with weakness on the right, but the arm appears ataxic.   I have reviewed labs in epic and the results pertinent to this consultation are: Chem 8-unremarkable  I have reviewed the images obtained:CT head - negative.   Impression: 75 yo F with  mild right sided weakness that started this evening. I suspect tha this is a small embolic infarct due to afib but small vessel disease could certainly also be possible.  She will need to be admitted for further evaluation/management. I would hold her anticoagulation, at least pending MRI quantification of stroke burden.   Recommendations: 1. HgbA1c, fasting lipid panel 2. MRI, MRA  of the brain without contrast 3. Frequent neuro checks 4. Echocardiogram 5. Carotid dopplers 6. Prophylactic  therapy-Antiplatelet med: Aspirin - dose 325mg  PO or 300mg  PR 7. Risk factor modification 8. Telemetry monitoring 9. PT consult, OT consult, Speech consult 10. please page stroke NP  Or  PA  Or MD  M-F from 8am -4 pm starting 3/29 as this patient will be followed by the stroke team at this point.   You can look them up on [email or web address]  Password [email or web address]    Roland Rack, MD [facility] [phone]  If 7pm- 7am, please page neurology on call as listed in [place].

## 2015-06-27 NOTE — Progress Notes (Signed)
23:33 attempted to call report for patient coming to [phone]

## 2015-06-27 NOTE — ED Provider Notes (Signed)
CSN: [id number]     Arrival date & time 06/27/15  2158 History   First MD Initiated Contact with Patient 06/27/15 2159     Chief Complaint  Patient presents with  . Code Stroke     (Consider location/radiation/quality/duration/timing/severity/associated sxs/prior Treatment) Patient is a 75 y.o. female presenting with Acute Neurological Problem. The history is provided by the patient.  Cerebrovascular Accident This is a new problem. The current episode started 6 to 12 hours ago. The problem occurs constantly. The problem has not changed since onset.Pertinent negatives include no chest pain, no abdominal pain, no headaches and no shortness of breath. Nothing aggravates the symptoms. Nothing relieves the symptoms. She has tried nothing for the symptoms. The treatment provided no relief.   75 yo F with a chief complaint of strokelike symptoms. Patient said about 4:00 noticed that she was having slurred speech and right-sided weakness and difficulty walking. On EMS arrival noted to be dragging her right leg and having difficulty with gait on that side. She was made a code stroke on arrival  Past Medical History  Diagnosis Date  . Hypertension   . Heart murmur   . Anxiety   . Arrhythmia   . Arthritis   . Atrial fibrillation ([place])   . Acute heart failure ([place])   . Hyperlipemia    Past Surgical History  Procedure Laterality Date  . Tonsillectomy    . Appendectomy    . Tumor removal      right arm  . Bladder suspension    . Abdominal hysterectomy    . Tee without cardioversion N/A 10/18/2014    Procedure: TRANSESOPHAGEAL ECHOCARDIOGRAM (TEE);  Surgeon: Thayer Headings, MD;  Location: [facility] ORS;  Service: Cardiovascular;  Laterality: N/A;  . Electrophysiologic study N/A 10/18/2014    Procedure: CARDIOVERSION;  Surgeon: Thayer Headings, MD;  Location: [facility] ORS;  Service: Cardiovascular;  Laterality: N/A;   Family History  Problem Relation Age of Onset  . Osteoporosis Mother   . Heart  disease Mother   . Cancer Father   . Diabetes Maternal Aunt    Social History  Substance Use Topics  . Smoking status: Never Smoker   . Smokeless tobacco: Never Used  . Alcohol Use: No   OB History    Gravida Para Term Preterm AB TAB SAB Ectopic Multiple Living   0 0 0 0 0 0 0 0       Review of Systems  Constitutional: Negative for fever and chills.  HENT: Negative for congestion and rhinorrhea.   Eyes: Negative for redness and visual disturbance.  Respiratory: Negative for shortness of breath and wheezing.   Cardiovascular: Negative for chest pain and palpitations.  Gastrointestinal: Negative for nausea, vomiting and abdominal pain.  Genitourinary: Negative for dysuria and urgency.  Musculoskeletal: Negative for myalgias and arthralgias.  Skin: Negative for pallor and wound.  Neurological: Positive for facial asymmetry, speech difficulty and weakness. Negative for dizziness and headaches.      Allergies  Ciprofloxacin; Ciprofloxacin; Hydrochlorothiazide; Hydrochlorothiazide w-triamterene; Sulfa antibiotics; Sulfonamide derivatives; Valsartan; and Valsartan  Home Medications   Prior to Admission medications   Medication Sig Start Date End Date Taking? Authorizing Provider  amiodarone (PACERONE) 200 MG tablet Take 1 tablet (200 mg total) by mouth daily. Patient taking differently: Take 100 mg by mouth daily.  01/09/15  Yes Minna Merritts, MD  amLODipine (NORVASC) 2.5 MG tablet Take 1 tablet (2.5 mg total) by mouth daily. 04/24/15  Yes Minna Merritts, MD  apixaban (ELIQUIS) 5 MG TABS tablet Take 1 tablet (5 mg total) by mouth 2 (two) times daily. 12/09/14  Yes Minna Merritts, MD  metoprolol tartrate (LOPRESSOR) 25 MG tablet Take 0.5 tablets (12.5 mg total) by mouth 2 (two) times daily. 01/09/15  Yes Minna Merritts, MD  BEE POLLEN PO Take 2 tablets by mouth daily.     Historical Provider, MD  betamethasone dipropionate (DIPROLENE) 0.05 % cream Apply topically 2 (two)  times daily. 02/01/14   Amy Cletis [place], MD  Cholecalciferol 1000 UNITS capsule Take 2,000 Units by mouth daily.     Historical Provider, MD  CINNAMON PO Take 2 tablets by mouth daily.     Historical Provider, MD  Coenzyme Q10-Fish Oil-Vit E (CO-Q 10 OMEGA-3 FISH OIL) CAPS Take 1 capsule by mouth 2 (two) times daily.    Historical Provider, MD  CRANBERRY PO Take 1 tablet by mouth 2 (two) times daily.     Historical Provider, MD  DANDELION PO Take 1 tablet by mouth 2 (two) times daily.    Historical Provider, MD  estradiol (ESTRACE) 0.1 MG/GM vaginal cream Place 1 Applicatorful vaginally once a week. 12/23/14   Alexis Frock, MD  EYEBRIGHT PO Take 1 tablet by mouth daily.    Historical Provider, MD  Hyaluronic Acid-Vitamin C (HYALURONIC ACID PO) Take 2 tablets by mouth daily.    Historical Provider, MD  L-GLUTAMINE PO Take 1 tablet by mouth 2 (two) times daily.     Historical Provider, MD  MILK THISTLE PO Take 1 tablet by mouth daily.     Historical Provider, MD  MISC NATURAL PRODUCTS PO Take 1 tablet by mouth daily. Advanced BP Support-Dr.Sinatra ([place]'s #1 integrated cardiologist)    Historical Provider, MD  MISC NATURAL PRODUCTS PO Take 1 tablet by mouth daily. Multivitamin-Dr. Talmage Coin    Historical Provider, MD  MISC NATURAL PRODUCTS PO Take 4 tablets by mouth daily. [place]    Historical Provider, MD  MISC NATURAL PRODUCTS PO Take 1 tablet by mouth daily. Ashwagandha    Historical Provider, MD  MISC NATURAL PRODUCTS PO Take 5 tablets by mouth daily. Bone-Up by Gus Height    Historical Provider, MD  MISC NATURAL PRODUCTS PO Advanced Glucosamine Chondroitin-MSM Natures Plus Ultra Rx Joint Liquid 1/2 capful per day    Historical Provider, MD  MISC NATURAL PRODUCTS PO Take 2 tablets by mouth daily. Curcumin    Historical Provider, MD  OVER THE COUNTER MEDICATION Take 2 tablets by mouth daily. Omega Q Plus w/resveratrol-Dr. Talmage Coin    Historical Provider, MD  Probiotic CAPS Take 2  capsules by mouth daily.    Historical Provider, MD  Probiotic Product (PROBIOTIC DAILY PO) Dr. Gertie Exon Probiotics one tablet daily    Historical Provider, MD  QUERCETIN PO Take 1-2 tablets by mouth daily. w/bromelain    Historical Provider, MD  ROYAL JELLY PO Take 1 tablet by mouth daily.    Historical Provider, MD  RUTIN PO Take 1 capsule by mouth 2 (two) times daily.    Historical Provider, MD   BP 144/92 mmHg  Pulse 65  Temp(Src) 98.3 F (36.8 C) (Oral)  Resp 12  Ht 5' 5.5" (1.664 m)  Wt 126 lb (57.153 kg)  BMI 20.64 kg/m2  SpO2 96%  LMP  (LMP Unknown) Physical Exam  Constitutional: She is oriented to person, place, and time. She appears well-developed and well-nourished. No distress.  HENT:  Head: Normocephalic and atraumatic.  Eyes: EOM are normal. Pupils are  equal, round, and reactive to light.  Neck: Normal range of motion. Neck supple.  Cardiovascular: Normal rate and regular rhythm.  Exam reveals no gallop and no friction rub.   No murmur heard. Pulmonary/Chest: Effort normal. She has no wheezes. She has no rales.  Abdominal: Soft. She exhibits no distension. There is no tenderness.  Musculoskeletal: She exhibits no edema or tenderness.  Neurological: She is alert and oriented to person, place, and time.  Right-sided facial droop right upper and lower extremity weakness 4/5 compared to L.   Skin: Skin is warm and dry. She is not diaphoretic.  Psychiatric: She has a normal mood and affect. Her behavior is normal.  Nursing note and vitals reviewed.   ED Course  Procedures (including critical care time) Labs Review Labs Reviewed  PROTIME-INR - Abnormal; Notable for the following:    Prothrombin Time 15.6 (*)    All other components within normal limits  I-STAT CHEM 8, ED - Abnormal; Notable for the following:    Glucose, Bld 118 (*)    Calcium, Ion 1.08 (*)    Hemoglobin 16.0 (*)    HCT 47.0 (*)    All other components within normal limits  CBG MONITORING, ED  - Abnormal; Notable for the following:    Glucose-Capillary 103 (*)    All other components within normal limits  APTT  CBC  DIFFERENTIAL  ETHANOL  COMPREHENSIVE METABOLIC PANEL  URINE RAPID DRUG SCREEN, HOSP PERFORMED  URINALYSIS, ROUTINE W REFLEX MICROSCOPIC (NOT AT [facility])  Randolm Idol, ED    Imaging Review Ct Head Wo Contrast  06/27/2015  CLINICAL DATA:  75 year old female with right-sided weakness and slurred speech. Code stroke. EXAM: CT HEAD WITHOUT CONTRAST TECHNIQUE: Contiguous axial images were obtained from the base of the skull through the vertex without intravenous contrast. COMPARISON:  None. FINDINGS: There is mild prominence of the ventricles and sulci compatible with age-related atrophy. Minimal periventricular and deep white matter chronic microvascular ischemic changes noted. There is no acute intracranial hemorrhage. No mass effect or midline shift. A 5 mm focal hyperdense focus along the M1 segment of the left MCA (series 201, image 13) may be artifactual. A left MCA thrombus is not excluded. Correlation with clinical exam recommended. CT angiography may provide additional evaluation. The visualized paranasal sinuses and the mastoid air cells are clear. The calvarium is intact. IMPRESSION: No acute intracranial hemorrhage. Artifact versus less likely a small focal left MCA thrombus. Clinical correlation recommended. CT angiography may provide better evaluation. Electronically Signed   By: Anner Crete M.D.   On: 06/27/2015 22:23   I have personally reviewed and evaluated these images and lab results as part of my medical decision-making.   EKG Interpretation   Date/Time:  Tuesday June 27 2015 22:12:18 EDT Ventricular Rate:  74 PR Interval:  161 QRS Duration: 101 QT Interval:  419 QTC Calculation: 465 R Axis:   72 Text Interpretation:  Sinus rhythm Probable left atrial enlargement RSR'  in V1 or V2, probably normal variant Left ventricular hypertrophy No   significant change since last tracing Confirmed by Hillery Zachman MD, Quillian Quince  [id number]) on 06/27/2015 10:26:20 PM      MDM   Final diagnoses:  Cerebrovascular accident (CVA) due to occlusion of cerebral artery ([place])    75 yo F with a chief complaint of strokelike symptoms. Code stroke was initiated. I evaluated the patient on arrival to the bridge patient's airways intact she is clear to go to the CT scanner.  CT scan is concerning for an acute stroke. Patient is on Eliquis and no further intervention is available. Neurology Dr. Leonel Ramsay evaluated the patient at bedside. Recommends hospitalist admission switching from Eliquis to aspirin continuing stroke workup.  The patients results and plan were reviewed and discussed.   Any x-rays performed were independently reviewed by myself.   Differential diagnosis were considered with the presenting HPI.  Medications - No data to display  Filed Vitals:   06/27/15 2216  BP: 144/92  Pulse: 65  Temp: 98.3 F (36.8 C)  TempSrc: Oral  Resp: 12  Height: 5' 5.5" (1.664 m)  Weight: 126 lb (57.153 kg)  SpO2: 96%    Final diagnoses:  Cerebrovascular accident (CVA) due to occlusion of cerebral artery ([place])    Admission/ observation were discussed with the admitting physician, patient and/or family and they are comfortable with the plan.    Deno Etienne, DO 06/27/15 2228

## 2015-06-27 NOTE — Progress Notes (Signed)
Code Stroke called on 75 y.o female, LSN 1600. Pertinent history includes atrial fibrillation (on Eliquis), and HTN. Pt developed right side weakness starting around 1600, which seemed to worsen. EMS called and noted Pt dragging right leg. Patient brought [facility] from home. Pt taken to CT scan STAT, negative for acute abnormalities per Dr. Leonel Ramsay.  NIHSS completed yielding 3 for left arm weakness, right legs weakness and ataxia. CBG 103. Pt out of window for TPA treatment and also contraindicated due to Eliquis. Pt for admit and full stroke work up.

## 2015-06-28 ENCOUNTER — Encounter (HOSPITAL_COMMUNITY): Payer: Self-pay | Admitting: Internal Medicine

## 2015-06-28 ENCOUNTER — Ambulatory Visit (HOSPITAL_COMMUNITY): Payer: Medicare PPO

## 2015-06-28 ENCOUNTER — Inpatient Hospital Stay (HOSPITAL_COMMUNITY): Payer: Medicare PPO

## 2015-06-28 DIAGNOSIS — I63512 Cerebral infarction due to unspecified occlusion or stenosis of left middle cerebral artery: Secondary | ICD-10-CM

## 2015-06-28 DIAGNOSIS — I6789 Other cerebrovascular disease: Secondary | ICD-10-CM

## 2015-06-28 LAB — COMPREHENSIVE METABOLIC PANEL
ALT: 21 U/L (ref 14–54)
AST: 31 U/L (ref 15–41)
Albumin: 3.8 g/dL (ref 3.5–5.0)
Alkaline Phosphatase: 62 U/L (ref 38–126)
Anion gap: 11 (ref 5–15)
BUN: 10 mg/dL (ref 6–20)
CALCIUM: 9.2 mg/dL (ref 8.9–10.3)
CO2: 23 mmol/L (ref 22–32)
CREATININE: 0.66 mg/dL (ref 0.44–1.00)
Chloride: 104 mmol/L (ref 101–111)
GFR calc Af Amer: >60 mL/min (ref >60)
GLUCOSE: 118 mg/dL — AB (ref 65–99)
POTASSIUM: 3.7 mmol/L (ref 3.5–5.1)
Sodium: 138 mmol/L (ref 135–145)
TOTAL PROTEIN: 6.6 g/dL (ref 6.5–8.1)
Total Bilirubin: 0.8 mg/dL (ref 0.3–1.2)

## 2015-06-28 LAB — CBC
HEMATOCRIT: 40.4 % (ref 36.0–46.0)
HEMATOCRIT: 40.8 % (ref 36.0–46.0)
Hemoglobin: 13.4 g/dL (ref 12.0–15.0)
Hemoglobin: 13.4 g/dL (ref 12.0–15.0)
MCH: 31.7 pg (ref 26.0–34.0)
MCH: 31.3 pg (ref 26.0–34.0)
MCHC: 33.2 g/dL (ref 30.0–36.0)
MCHC: 32.8 g/dL (ref 30.0–36.0)
MCV: 95.5 fL (ref 78.0–100.0)
MCV: 95.3 fL (ref 78.0–100.0)
PLATELETS: 182 10*3/uL (ref 150–400)
PLATELETS: 191 10*3/uL (ref 150–400)
RBC: 4.23 MIL/uL (ref 3.87–5.11)
RBC: 4.28 MIL/uL (ref 3.87–5.11)
RDW: 12.6 % (ref 11.5–15.5)
RDW: 12.6 % (ref 11.5–15.5)
WBC: 5.5 10*3/uL (ref 4.0–10.5)
WBC: 5.5 10*3/uL (ref 4.0–10.5)

## 2015-06-28 LAB — RAPID URINE DRUG SCREEN, HOSP PERFORMED
Amphetamines: NOT DETECTED
BENZODIAZEPINES: NOT DETECTED
Barbiturates: NOT DETECTED
COCAINE: NOT DETECTED
Opiates: NOT DETECTED
Tetrahydrocannabinol: NOT DETECTED

## 2015-06-28 LAB — LIPID PANEL
CHOLESTEROL: 276 mg/dL — AB (ref 0–200)
HDL: 70 mg/dL (ref >40)
LDL CALC: 195 mg/dL — AB (ref 0–99)
Total CHOL/HDL Ratio: 3.9 RATIO
Triglycerides: 57 mg/dL (ref <150)
VLDL: 11 mg/dL (ref 0–40)

## 2015-06-28 LAB — URINALYSIS, ROUTINE W REFLEX MICROSCOPIC
Bilirubin Urine: NEGATIVE
GLUCOSE, UA: NEGATIVE mg/dL
HGB URINE DIPSTICK: NEGATIVE
Ketones, ur: NEGATIVE mg/dL
LEUKOCYTES UA: NEGATIVE
Nitrite: NEGATIVE
Protein, ur: NEGATIVE mg/dL
SPECIFIC GRAVITY, URINE: 1.008 (ref 1.005–1.030)
pH: 8 (ref 5.0–8.0)

## 2015-06-28 LAB — ECHOCARDIOGRAM COMPLETE
HEIGHTINCHES: 65.5 in
Weight: 2016 oz

## 2015-06-28 LAB — CREATININE, SERUM: CREATININE: 0.68 mg/dL (ref 0.44–1.00)

## 2015-06-28 IMAGING — CT CT ANGIO HEAD
1 of 10 series · 1 of 33 positions shown · IV contrast (omnipaque)
Comparison: Head MRI/ MRA earlier today

CLINICAL DATA: Right leg weakness and difficulty walking. Posterior
headaches. Acute left basal ganglia infarct on MRI.

EXAM:
CT ANGIOGRAPHY HEAD AND NECK
TECHNIQUE: Multidetector CT imaging of the head and neck was performed using
the standard protocol during bolus administration of intravenous
contrast. Multiplanar CT image reconstructions and MIPs were
obtained to evaluate the vascular anatomy. Carotid stenosis
measurements (when applicable) are obtained utilizing NASCET
criteria, using the distal internal carotid diameter as the
denominator.
CONTRAST:  50mL OMNIPAQUE IOHEXOL 350 MG/ML SOLN

[Series 200: locator · axial · 0.49mm/px · 1 of 1 slices shown]
[im 1/1  soft-tissue]
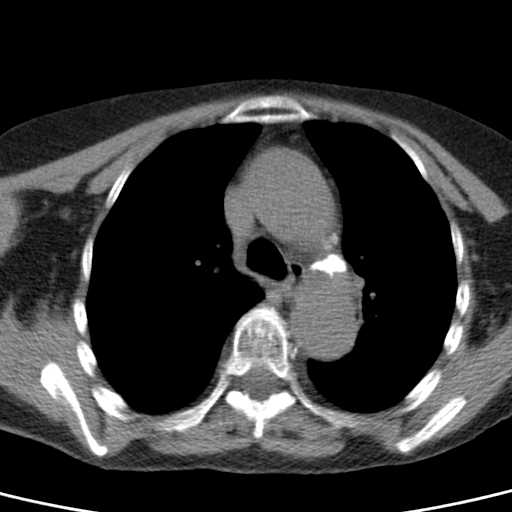

[1 of 33 positions shown; findings below may reference images not displayed]

FINDINGS: CTA NECK

Aortic arch: Normal variant aortic arch branching pattern with
common origin of the brachiocephalic and left common carotid
arteries. Noncalcified plaque in the proximal left subclavian artery
results in less than 50% stenosis. No brachiocephalic or right
subclavian artery stenosis.

Right carotid system: Patent with mild non stenotic plaque at the
carotid bifurcation. Tortuous distal cervical ICA.

Left carotid system: Patent with mild non stenotic plaque at the
common carotid artery origin and at the carotid bifurcation.

Vertebral arteries: The vertebral arteries are patent with the right
being dominant. No vertebral artery stenosis is identified. Left V1
assessment is slightly limited by surrounding dense venous contrast.

Skeleton: Mild cervical spondylosis.

Other neck: Mild enlargement of the right greater than left thyroid
lobes with multiple small predominantly hypoattenuating nodules
suggestive of multinodular goiter.

CTA HEAD

Anterior circulation: The internal carotid arteries are patent from
skullbase to carotid termini. There is mild carotid siphon
atherosclerosis bilaterally, and there is mild narrowing of the
anterior cavernous and proximal supraclinoid segments on the right.
A small infundibulum is noted at the right posterior communicating
artery origin. There is also a 2 mm outpouching in the region of the
left posterior communicating artery origin also favored to reflect a
small infundibulum. The right A1 segment is absent. The left A1
segment is widely patent and supplies the right ACA. MCAs are patent
without evidence of significant proximal stenosis or major branch
vessel occlusion. Mild bilateral MCA branch vessel irregularity
suggests atherosclerosis.

Posterior circulation: The intracranial vertebral arteries are
patent with the right being dominant. The left vertebral artery
functionally ends in PICA. PICA and SCA origins are patent. Basilar
artery is patent without stenosis. There is a fetal type origin of
the right PCA with hypoplastic P1 segment. There is mild narrowing
of the mid to distal right posterior communicating artery, and there
is severe stenosis of the proximal P2 segment. There is also a
focal, moderate to severe left PCA stenosis involving the distal P1
segment.

Venous sinuses: Patent.

Anatomic variants: Absent right A1.

Delayed phase: Mild edema is noted in corresponding to the acute
left basal ganglia infarct, most conspicuous in the posterior
putamen. There is no evidence of acute intracranial hemorrhage or
abnormal enhancement.
IMPRESSION: 1. Moderate to severe bilateral proximal PCA stenoses.
2. Mild right cavernous and supraclinoid ICA stenosis.
3. No cervical carotid or vertebral artery stenosis.

## 2015-06-28 MED ORDER — ATORVASTATIN CALCIUM 80 MG PO TABS
80.0000 mg | ORAL_TABLET | Freq: Every day | ORAL | Status: DC
  Filled 2015-06-28: qty 1

## 2015-06-28 MED ORDER — ALPRAZOLAM 0.5 MG PO TABS
0.5000 mg | ORAL_TABLET | Freq: Once | ORAL | Status: AC
  Administered 2015-06-28: 0.5 mg via ORAL
  Filled 2015-06-28: qty 1

## 2015-06-28 MED ORDER — ASPIRIN EC 81 MG PO TBEC
81.0000 mg | DELAYED_RELEASE_TABLET | Freq: Every day | ORAL | Status: DC
  Administered 2015-06-29 – 2015-07-03 (×5): 81 mg via ORAL
  Filled 2015-06-28 (×5): qty 1

## 2015-06-28 MED ORDER — ENOXAPARIN SODIUM 40 MG/0.4ML ~~LOC~~ SOLN
40.0000 mg | SUBCUTANEOUS | Status: DC
  Administered 2015-06-28: 40 mg via SUBCUTANEOUS
  Filled 2015-06-28: qty 0.4

## 2015-06-28 MED ORDER — AMLODIPINE BESYLATE 2.5 MG PO TABS
2.5000 mg | ORAL_TABLET | Freq: Every day | ORAL | Status: DC
  Administered 2015-06-28 – 2015-07-03 (×6): 2.5 mg via ORAL
  Filled 2015-06-28 (×6): qty 1

## 2015-06-28 MED ORDER — ASPIRIN 325 MG PO TABS
325.0000 mg | ORAL_TABLET | Freq: Every day | ORAL | Status: DC
  Administered 2015-06-28: 325 mg via ORAL
  Filled 2015-06-28: qty 1

## 2015-06-28 MED ORDER — IOHEXOL 350 MG/ML SOLN
50.0000 mL | Freq: Once | INTRAVENOUS | Status: AC | PRN
  Administered 2015-06-28: 50 mL via INTRAVENOUS

## 2015-06-28 MED ORDER — ASPIRIN 300 MG RE SUPP: 300.0000 mg | Freq: Every day | RECTAL | Status: DC

## 2015-06-28 MED ORDER — METOPROLOL TARTRATE 12.5 MG HALF TABLET
12.5000 mg | ORAL_TABLET | Freq: Two times a day (BID) | ORAL | Status: DC
  Administered 2015-06-28 – 2015-07-03 (×12): 12.5 mg via ORAL
  Filled 2015-06-28 (×12): qty 1

## 2015-06-28 MED ORDER — AMIODARONE HCL 200 MG PO TABS
100.0000 mg | ORAL_TABLET | Freq: Every day | ORAL | Status: DC
  Administered 2015-06-28 – 2015-07-03 (×6): 100 mg via ORAL
  Filled 2015-06-28 (×6): qty 1

## 2015-06-28 MED ORDER — APIXABAN 5 MG PO TABS
5.0000 mg | ORAL_TABLET | Freq: Two times a day (BID) | ORAL | Status: DC
Start: 2015-06-28 — End: 2015-07-03
  Administered 2015-06-29 – 2015-07-03 (×10): 5 mg via ORAL
  Filled 2015-06-28 (×10): qty 1

## 2015-06-28 MED ORDER — SODIUM CHLORIDE 0.9 % IV SOLN
INTRAVENOUS | Status: DC
  Administered 2015-06-28: 03:00:00 via INTRAVENOUS

## 2015-06-28 MED ORDER — STROKE: EARLY STAGES OF RECOVERY BOOK
Freq: Once | Status: AC
  Administered 2015-06-28: 03:00:00

## 2015-06-28 NOTE — H&P (Signed)
[facility] History and Physical  Karen Bowman [id number] DOB: Aug 12, 1940 DOA: 06/27/2015  Referring physician: ER physician. PCP: [name], MD  Specialists: Cardiologist.  Chief Complaint: Right-sided weakness.  HPI: Karen Bowman is a 75 y.o. female with history of atrial fibrillation, hypertension, systolic heart failure presents to the ER because of right-sided weakness. Patient started experiencing right-sided upper extremity weakness last evening around 4 PM. The weakness persisted and patient later called EMS and patient was brought to the ER. EMS also noticed that patient had right lower extremity weakness in addition to the right upper extremity. In the ER patient was counseled by the neurologist and CD8 was unremarkable. Patient is on Apixaban and is not a candidate for TPA. On exam patient has right-sided facial droop and some difficulty expressing words and right upper and lower extremity weakness. Denies any blurred vision or difficulty swallowing.  Review of Systems: As presented in the history of presenting illness, rest negative.  Past Medical History  Diagnosis Date  . Hypertension   . Heart murmur   . Anxiety   . Arrhythmia   . Arthritis   . Atrial fibrillation ([place])   . Acute heart failure ([place])   . Hyperlipemia    Past Surgical History  Procedure Laterality Date  . Tonsillectomy    . Appendectomy    . Tumor removal      right arm  . Bladder suspension    . Abdominal hysterectomy    . Tee without cardioversion N/A 10/18/2014    Procedure: TRANSESOPHAGEAL ECHOCARDIOGRAM (TEE);  Surgeon: Thayer Headings, MD;  Location: [facility] ORS;  Service: Cardiovascular;  Laterality: N/A;  . Electrophysiologic study N/A 10/18/2014    Procedure: CARDIOVERSION;  Surgeon: Thayer Headings, MD;  Location: [facility] ORS;  Service: Cardiovascular;  Laterality: N/A;   Social History:  reports that she has never smoked. She has never used smokeless tobacco. She reports  that she does not drink alcohol or use illicit drugs. Where does patient live Home. Can patient participate in ADLs? Yes.  Allergies  Allergen Reactions  . Ciprofloxacin     REACTION: dizzy  . Ciprofloxacin Nausea And Vomiting  . Hydrochlorothiazide Nausea And Vomiting  . Hydrochlorothiazide W-Triamterene     REACTION: nausea  . Sulfa Antibiotics Nausea And Vomiting  . Sulfonamide Derivatives     REACTION: nausea \\T \ vomiting  . Valsartan     REACTION: angioedema - probable  . Valsartan Nausea And Vomiting    Family History:  Family History  Problem Relation Age of Onset  . Osteoporosis Mother   . Heart disease Mother   . Cancer Father   . Diabetes Maternal Aunt       Prior to Admission medications   Medication Sig Start Date End Date Taking? Authorizing Provider  amiodarone (PACERONE) 200 MG tablet Take 1 tablet (200 mg total) by mouth daily. Patient taking differently: Take 100 mg by mouth daily.  01/09/15  Yes Minna Merritts, MD  amLODipine (NORVASC) 2.5 MG tablet Take 1 tablet (2.5 mg total) by mouth daily. 04/24/15  Yes Minna Merritts, MD  apixaban (ELIQUIS) 5 MG TABS tablet Take 1 tablet (5 mg total) by mouth 2 (two) times daily. 12/09/14  Yes Minna Merritts, MD  BEE POLLEN PO Take 2 tablets by mouth daily.    Yes Historical Provider, MD  Cholecalciferol 1000 UNITS capsule Take 2,000 Units by mouth daily.    Yes Historical Provider, MD  CINNAMON PO Take 2 tablets  by mouth daily.    Yes Historical Provider, MD  Coenzyme Q10-Fish Oil-Vit E (CO-Q 10 OMEGA-3 FISH OIL) CAPS Take 1 capsule by mouth 2 (two) times daily.   Yes Historical Provider, MD  CRANBERRY PO Take 1 tablet by mouth 2 (two) times daily.    Yes Historical Provider, MD  DANDELION PO Take 1 tablet by mouth 2 (two) times daily.   Yes Historical Provider, MD  EYEBRIGHT PO Take 1 tablet by mouth daily.   Yes Historical Provider, MD  Hyaluronic Acid-Vitamin C (HYALURONIC ACID PO) Take 2 tablets by mouth daily.    Yes Historical Provider, MD  L-GLUTAMINE PO Take 1 tablet by mouth 2 (two) times daily.    Yes Historical Provider, MD  metoprolol tartrate (LOPRESSOR) 25 MG tablet Take 0.5 tablets (12.5 mg total) by mouth 2 (two) times daily. 01/09/15  Yes Minna Merritts, MD  MILK THISTLE PO Take 1 tablet by mouth daily.    Yes Historical Provider, MD  MISC NATURAL PRODUCTS PO Take 1 tablet by mouth daily. Advanced BP Support-Dr.Sinatra ([place]'s #1 integrated cardiologist)   Yes Historical Provider, MD  MISC NATURAL PRODUCTS PO Take 1 tablet by mouth daily. Multivitamin-Dr. Talmage Coin   Yes Historical Provider, MD  MISC NATURAL PRODUCTS PO Take 4 tablets by mouth daily. [place]   Yes Historical Provider, MD  MISC NATURAL PRODUCTS PO Take 1 tablet by mouth daily. Ashwagandha   Yes Historical Provider, MD  MISC NATURAL PRODUCTS PO Take 5 tablets by mouth daily. Bone-Up by Gus Height   Yes Historical Provider, MD  MISC NATURAL PRODUCTS PO Advanced Glucosamine Chondroitin-MSM Natures Plus Ultra Rx Joint Liquid 1/2 capful per day   Yes Historical Provider, MD  MISC NATURAL PRODUCTS PO Take 2 tablets by mouth daily. Curcumin   Yes Historical Provider, MD  OVER THE COUNTER MEDICATION Take 2 tablets by mouth daily. Omega Q Plus w/resveratrol-Dr. Talmage Coin   Yes Historical Provider, MD  Probiotic CAPS Take 2 capsules by mouth daily.   Yes Historical Provider, MD  Probiotic Product (PROBIOTIC DAILY PO) Dr. Gertie Exon Probiotics one tablet daily   Yes Historical Provider, MD  QUERCETIN PO Take 1-2 tablets by mouth daily. w/bromelain   Yes Historical Provider, MD  ROYAL JELLY PO Take 1 tablet by mouth daily.   Yes Historical Provider, MD  RUTIN PO Take 1 capsule by mouth 2 (two) times daily.   Yes Historical Provider, MD  betamethasone dipropionate (DIPROLENE) 0.05 % cream Apply topically 2 (two) times daily. Patient not taking: Reported on 06/27/2015 02/01/14   Jinny Sanders, MD  estradiol (ESTRACE) 0.1 MG/GM  vaginal cream Place 1 Applicatorful vaginally once a week. Patient not taking: Reported on 06/27/2015 12/23/14   Alexis Frock, MD    Physical Exam: Filed Vitals:   06/27/15 2240 06/27/15 2245 06/27/15 2300 06/28/15 0010  BP: 128/78 117/94 125/83 114/89  Pulse: 67 63 63 69  Temp:    98.9 F (37.2 C)  TempSrc:    Oral  Resp: 16 12 12 18   Height:      Weight:      SpO2: 97% 98% 97% 97%     General:  Moderately built and nourished.  Eyes: Anicteric no pallor.  ENT: No discharge from the ears eyes nose and mouth.  Neck: No mass felt.  Cardiovascular: S1 and S2 heard.  Respiratory: No rhonchi or crepitations.  Abdomen: Soft nontender bowel sounds present.  Skin: No rash.  Musculoskeletal: No edema.  Psychiatric: Appears  normal.  Neurologic: Alert awake oriented to time place and person. Right-sided upper and lower extremity is 4 x 5 in strength. Right facial droop. PERRLA positive. Left upper and lower extremity is 5 x 5 strength.  Labs on Admission:  Basic Metabolic Panel:  Recent Labs Lab 06/27/15 2201 06/27/15 2208  NA 139 138  K 3.7 3.6  CL 104 101  CO2 23  --   GLUCOSE 125* 118*  BUN 14 19  CREATININE 0.78 0.70  CALCIUM 9.4  --    Liver Function Tests:  Recent Labs Lab 06/27/15 2201  AST 30  ALT 22  ALKPHOS 65  BILITOT 0.5  PROT 6.5  ALBUMIN 3.9   No results for input(s): LIPASE, AMYLASE in the last 168 hours. No results for input(s): AMMONIA in the last 168 hours. CBC:  Recent Labs Lab 06/27/15 2201 06/27/15 2208  WBC 5.9  --   NEUTROABS 4.5  --   HGB 14.0 16.0*  HCT 42.6 47.0*  MCV 96.2  --   PLT 182  --    Cardiac Enzymes: No results for input(s): CKTOTAL, CKMB, CKMBINDEX, TROPONINI in the last 168 hours.  BNP (last 3 results)  Recent Labs  10/14/14 1751  BNP 1018.0*    ProBNP (last 3 results) No results for input(s): PROBNP in the last 8760 hours.  CBG:  Recent Labs Lab 06/27/15 2216  GLUCAP 103*     Radiological Exams on Admission: Ct Head Wo Contrast  06/27/2015  ADDENDUM REPORT: 06/27/2015 22:28 ADDENDUM: These results were called by telephone at the time of interpretation on 06/27/2015 at 10:28 pm to Dr. Leonel Ramsay, who verbally acknowledged these results. Electronically Signed   By: Anner Crete M.D.   On: 06/27/2015 22:28  06/27/2015  CLINICAL DATA:  75 year old female with right-sided weakness and slurred speech. Code stroke. EXAM: CT HEAD WITHOUT CONTRAST TECHNIQUE: Contiguous axial images were obtained from the base of the skull through the vertex without intravenous contrast. COMPARISON:  None. FINDINGS: There is mild prominence of the ventricles and sulci compatible with age-related atrophy. Minimal periventricular and deep white matter chronic microvascular ischemic changes noted. There is no acute intracranial hemorrhage. No mass effect or midline shift. A 5 mm focal hyperdense focus along the M1 segment of the left MCA (series 201, image 13) may be artifactual. A left MCA thrombus is not excluded. Correlation with clinical exam recommended. CT angiography may provide additional evaluation. The visualized paranasal sinuses and the mastoid air cells are clear. The calvarium is intact. IMPRESSION: No acute intracranial hemorrhage. Artifact versus less likely a small focal left MCA thrombus. Clinical correlation recommended. CT angiography may provide better evaluation. Electronically Signed: By: Anner Crete M.D. On: 06/27/2015 22:23    EKG: Independently reviewed. Normal sinus rhythm.  Assessment/Plan Principal Problem:   Stroke [facility]) Active Problems:   HYPERLIPIDEMIA   HYPERTENSION, BENIGN ESSENTIAL   A-fib (HCC)   Stroke (cerebrum) (HCC)   Cerebrovascular accident (CVA) due to occlusion of cerebral artery ([place])   1. Stroke with right-sided weakness - discussed with Dr. Leonel Ramsay on call neurologist who has advised at this time to hold off Apixaban until stroke  workup complete. MRI/MRA brain 2-D echo carotid Doppler has been ordered. Check hemoglobin A1c and lipid panel. Get physical therapy consult. Restart anticoagulation once okay with neurologist. 2. Atrial fibrillation presently in sinus rhythm - chads 2 vasc score is 4. Holding off Apixaban as requested by neurologist. Continue amiodarone and beta blockers for rate control. 3. Hypertension -  allow for permissive hypertension. Continue amlodipine and beta blockers. 4. Systolic heart failure - last EF measured was 35-40% in August 2016. Appears compensated. Since patient has stroke patient is receiving gentle hydration.    DVT ProphylaxisLovenox. Code Status: Full code.  Family Communication: Discussed with patient.  Disposition Plan: Admit to inpatient.    Allene Furuya N. [facility] Pager [phone].  If 7PM-7AM, please contact night-coverage [email or web address] Password [email or web address] 06/28/2015, 1:08 AM

## 2015-06-28 NOTE — Progress Notes (Signed)
Pt unavailable for her 10 am neuro assessment and vital signs. Cori Razor, RN

## 2015-06-28 NOTE — Plan of Care (Signed)
[facility] PLAN OF CARE NOTE Patient: Karen Bowman [id number]   PCP: [name], MD DOB: [birth year]   DOA: 06/27/2015   DOS: 06/28/2015    Patient was admitted by my colleague Dr. Hal Hope earlier on 06/28/2015. I have reviewed the H&P as well as assessment and plan and agree with the same. Important changes in the plan are listed below.  Plan of care: Principal Problem:   Stroke [facility]) Neurology probably recommending resuming Eliquis along with aspirin for her lacunar infarct. Further workup is currently pending for stroke.  Author: Berle Mull, MD [facility] Pager: [phone] 06/28/2015 5:47 PM   If 7PM-7AM, please contact night-coverage at [email or web address], password [email or web address]

## 2015-06-28 NOTE — Care Management Note (Signed)
Case Management Note  Patient Details  Name: SHENEQUA SYKORA MRN: [id number] Date of Birth: May 11, 1940  Subjective/Objective:                    Action/Plan: Patient was admitted with CVA. Admitted from home. Will follow for discharge needs pending PT/OT evals and physician orders.  Expected Discharge Date:                  Expected Discharge Plan:     In-House Referral:     Discharge planning Services     Post Acute Care Choice:    Choice offered to:     DME Arranged:    DME Agency:     HH Arranged:    HH Agency:     Status of Service:  In process, will continue to follow  Medicare Important Message Given:    Date Medicare IM Given:    Medicare IM give by:    Date Additional Medicare IM Given:    Additional Medicare Important Message give by:     If discussed at [place] of Stay Meetings, dates discussed:    Additional Comments:  Rolm Baptise, RN 06/28/2015, 11:33 AM [phone]

## 2015-06-28 NOTE — Progress Notes (Signed)
  Echocardiogram 2D Echocardiogram has been performed.  Symphanie Cederberg 06/28/2015, 2:05 PM

## 2015-06-28 NOTE — Progress Notes (Signed)
STROKE TEAM PROGRESS NOTE   HISTORY OF PRESENT ILLNESS Karen Bowman is a 75 y.o. female with a history of atrial fibrillation on apixaban who presents with right-sided weakness that started around 4:00 on 06/27/2015 ([facility]). This seemed to get slightly worse and therefore she decided to seek therapy and called 911. EMS noted that she was dragging her leg and activated a code stroke en route. She denies missing any doses of her apixaban. She denies any sensory change. Denies any speaking change, visual change, other symptoms. Patient was not administered IV t-PA secondary to being on Eliquis. She was admitted for further evaluation and treatment.   SUBJECTIVE (INTERVAL HISTORY) No family is at the bedside.  Overall she feels her condition is stable. She still has right side weakness and right facial droop. MRI showed right BG infarct. She is on eliquis and denies noncompliance.    OBJECTIVE Temp:  [98.2 F (36.8 C)-99.1 F (37.3 C)] 98.3 F (36.8 C) (03/29 2145) Pulse Rate:  [60-79] 71 (03/29 2145) Cardiac Rhythm:  [-] Normal sinus rhythm (03/29 1900) Resp:  [12-18] 17 (03/29 2145) BP: (97-158)/(60-105) 156/60 mmHg (03/29 2145) SpO2:  [95 %-100 %] 100 % (03/29 2145)  CBC:  Recent Labs Lab 06/27/15 2201  06/28/15 0200 06/28/15 0300  WBC 5.9  --  5.5 5.5  NEUTROABS 4.5  --   --   --   HGB 14.0  < > 13.4 13.4  HCT 42.6  < > 40.4 40.8  MCV 96.2  --  95.5 95.3  PLT 182  --  182 191  < > = values in this interval not displayed.  Basic Metabolic Panel:   Recent Labs Lab 06/27/15 2201 06/27/15 2208 06/28/15 0200 06/28/15 0300  NA 139 138  --  138  K 3.7 3.6  --  3.7  CL 104 101  --  104  CO2 23  --   --  23  GLUCOSE 125* 118*  --  118*  BUN 14 19  --  10  CREATININE 0.78 0.70 0.68 0.66  CALCIUM 9.4  --   --  9.2    Lipid Panel:     Component Value Date/Time   CHOL 276* 06/28/2015 0310   TRIG 57 06/28/2015 0310   HDL 70 06/28/2015 0310   CHOLHDL 3.9 06/28/2015 0310    VLDL 11 06/28/2015 0310   LDLCALC 195* 06/28/2015 0310   HgbA1c:  Lab Results  Component Value Date   HGBA1C 5.7 10/15/2014   Urine Drug Screen:     Component Value Date/Time   LABOPIA NONE DETECTED 06/28/2015 0006   COCAINSCRNUR NONE DETECTED 06/28/2015 0006   LABBENZ NONE DETECTED 06/28/2015 0006   AMPHETMU NONE DETECTED 06/28/2015 0006   THCU NONE DETECTED 06/28/2015 0006   LABBARB NONE DETECTED 06/28/2015 0006      IMAGING  Ct Angio Head and neck W/cm &/or Wo Cm  06/28/2015   IMPRESSION: 1. Moderate to severe bilateral proximal PCA stenoses. 2. Mild right cavernous and supraclinoid ICA stenosis. 3. No cervical carotid or vertebral artery stenosis. Electronically Signed   By: Logan Bores M.D.   On: 06/28/2015 12:03   Ct Head Wo Contrast  06/27/2015  IMPRESSION: No acute intracranial hemorrhage. Artifact versus less likely a small focal left MCA thrombus. Clinical correlation recommended. CT angiography may provide better evaluation. Electronically Signed: By: Anner Crete M.D. On: 06/27/2015 22:23   Mri and Mra Brain Wo Contrast  06/28/2015  IMPRESSION: 1. Acute left basal  ganglia infarct. 2. Mild chronic small vessel ischemic disease and cerebral atrophy. 3. No large vessel occlusion or significant proximal anterior circulation stenosis. 4. Severe right P2 PCA stenosis. Electronically Signed   By: Logan Bores M.D.   On: 06/28/2015 11:15   2D echo - - Left ventricle: The cavity size was normal. Wall thickness was  increased in a pattern of mild LVH. Systolic function was normal.  The estimated ejection fraction was in the range of 55% to 60%.  Wall motion was normal; there were no regional wall motion  abnormalities. - Mitral valve: Moderately calcified annulus. Moderately thickened,  moderately calcified leaflets . - Atrial septum: No defect or patent foramen ovale was identified.   Physical exam  Temp:  [98.2 F (36.8 C)-99.1 F (37.3 C)] 98.3 F (36.8  C) (03/29 2145) Pulse Rate:  [63-79] 71 (03/29 2145) Resp:  [12-18] 17 (03/29 2145) BP: (97-158)/(60-105) 156/60 mmHg (03/29 2145) SpO2:  [97 %-100 %] 100 % (03/29 2145)  General - Well nourished, well developed, in no apparent distress.  Ophthalmologic - Fundi not visualized due to eye movement.  Cardiovascular - Regular rate and rhythm.  Mental Status -  Level of arousal and orientation to time, place, and person were intact. Language including expression, naming, repetition, comprehension was assessed and found intact. Moderate dysarthria Fund of Knowledge was assessed and was intact.  Cranial Nerves II - XII - II - Visual field intact OU. III, IV, VI - Extraocular movements intact. V - Facial sensation intact bilaterally. VII - right facial droop. VIII - Hearing & vestibular intact bilaterally. X - Palate elevates symmetrically. XI - Chin turning & shoulder shrug intact bilaterally. XII - Tongue protrusion intact.  Motor Strength - The patient's strength was 2/5 proximal and 3+/5 distal of bother RUE and RLE, 5/5 LUE and LLE. Bulk was normal and fasciculations were absent.   Motor Tone - Muscle tone was assessed at the neck and appendages and was normal.  Reflexes - The patient's reflexes were 1+ in all extremities and she had no pathological reflexes.  Sensory - Light touch, temperature/pinprick were assessed and were symmetrical.    Coordination - The patient had normal movements in the left hand with no ataxia or dysmetria.  Tremor was absent.  Gait and Station - deferred due to safety concerns   ASSESSMENT/PLAN Karen Bowman is a 75 y.o. female with history of atrial fibrillation on eliquis, hypertension and systolic heart failure presenting with right-sided weakness. She did not receive IV t-PA due to being on Eliquis.   Stroke:  Dominant left BG infarct embolic, likely secondary to small vessel disease source. However, pt does have afib hx and on eliquis  before admission. Recommend eliquis and baby ASA daily for stroke prevention  Resultant  Right facial droop, right side hemiparesis  MRI  Left BG infarct  CTA head and neck - Moderate to severe bilateral proximal PCA stenoses  2D Echo  EF 55-60%  LDL 195  HgbA1c pending  Lovenox 40 mg sq daily for VTE prophylaxis Diet Heart Room service appropriate?: Yes; Fluid consistency:: Thin  Eliquis (apixaban) daily prior to admission, now on aspirin 325 mg daily. Due to afib and small infarct, we recommend to re-start eliquis and start ASA 81mg  for stroke prevention.   Patient counseled to be compliant with her antithrombotic medications  Ongoing aggressive stroke risk factor management  Therapy recommendations:  Pending   Disposition:  Pending   Atrial Fibrillation  Home anticoagulation:  Eliquis (apixaban) daily continued in the hospital  History of TEE with cardioversion 09/2014  Recommend to resume eliquis  Continue eliquis and ASA 81 at discharge  On metoprolol for rate control   Hypertension  Stable Permissive hypertension (OK if < 220/120) but gradually normalize in 5-7 days  Hyperlipidemia  Home meds:  No statin  LDL 195, goal < 70  Add lipitor 80mg    Continue statin at discharge  Other Stroke Risk Factors  Advanced age  Other Active Problems  Systolic heart failure. EF 35-40% in August 2016  Hospital day # 1  Neurology will sign off. Please call with questions. Pt will follow up with Dr. [name] at [facility] in about 2 months. Thanks for the consult.   Rosalin Hawking, MD PhD Stroke Neurology 06/28/2015 10:45 PM    To contact Stroke Continuity provider, please refer to [email or web address] After hours, contact General Neurology

## 2015-06-28 NOTE — Progress Notes (Signed)
Received report from [facility] in ED; pt arrived on unit at midnight with no complaints.  Patient was oriented to floor and room; all questions were answered and patient is resting waiting on further testing.  Husband was at bedside but has left for the night

## 2015-06-29 DIAGNOSIS — G8191 Hemiplegia, unspecified affecting right dominant side: Secondary | ICD-10-CM

## 2015-06-29 DIAGNOSIS — I480 Paroxysmal atrial fibrillation: Secondary | ICD-10-CM

## 2015-06-29 DIAGNOSIS — I5022 Chronic systolic (congestive) heart failure: Secondary | ICD-10-CM

## 2015-06-29 DIAGNOSIS — E782 Mixed hyperlipidemia: Secondary | ICD-10-CM

## 2015-06-29 DIAGNOSIS — I10 Essential (primary) hypertension: Secondary | ICD-10-CM

## 2015-06-29 DIAGNOSIS — I6350 Cerebral infarction due to unspecified occlusion or stenosis of unspecified cerebral artery: Secondary | ICD-10-CM

## 2015-06-29 DIAGNOSIS — I6339 Cerebral infarction due to thrombosis of other cerebral artery: Secondary | ICD-10-CM

## 2015-06-29 LAB — HEMOGLOBIN A1C
HEMOGLOBIN A1C: 5.7 % — AB (ref 4.8–5.6)
Mean Plasma Glucose: 117 mg/dL

## 2015-06-29 MED ORDER — ATORVASTATIN CALCIUM 40 MG PO TABS
40.0000 mg | ORAL_TABLET | Freq: Every day | ORAL | Status: DC
  Administered 2015-06-29 – 2015-07-02 (×4): 40 mg via ORAL
  Filled 2015-06-29 (×4): qty 1

## 2015-06-29 NOTE — Evaluation (Addendum)
Occupational Therapy Evaluation Patient Details Name: Karen Bowman MRN: [id number] DOB: 13-Jun-1940 Today's Date: 06/29/2015    History of Present Illness Adm Rt sided weakness; MRI + Lt BG CVA [id number], HTN, CHF,    Clinical Impression   Pt reports she was very independent with all ADLs and mobility PTA; pt tearful when discussing PLOF. Pt currently presenting with RUE decreased AROM, strength, and fine/gross motor coordination impacting her independence and safety with ADLs and functional mobility. Educated pt on RUE positioning, incorporating into functional activities, and ROM. Pt motivated to participate and therapies and eager to return to independence. Recommending CIR level therapies for follow up in order to maximize independence and safety with ADLs and functional mobility required for safe d/c home. Pt would benefit from continued skilled OT to address established goals.    Follow Up Recommendations  CIR;Supervision/Assistance - 24 hour    Equipment Recommendations  Other (comment) (TBD)    Recommendations for Other Services Rehab consult     Precautions / Restrictions Precautions Precautions: Fall Restrictions Weight Bearing Restrictions: No      Mobility Bed Mobility Overal bed mobility: Needs Assistance Bed Mobility: Supine to Sit;Sit to Supine     Supine to sit: Min assist Sit to supine: Min assist   General bed mobility comments: HOB flat, no bed rail. Assist for RLE to EOB and back into bed.  Transfers Overall transfer level: Needs assistance Equipment used: Rolling walker (2 wheeled) Transfers: Sit to/from Stand Sit to Stand: Mod assist         General transfer comment: Pt unable to stand for more than a few seconds; reports she feels very tired and her body is tired.     Balance Overall balance assessment: Needs assistance Sitting-balance support: Feet supported;Bilateral upper extremity supported Sitting balance-Leahy Scale: Fair      Standing balance support: Bilateral upper extremity supported Standing balance-Leahy Scale: Poor                              ADL Overall ADL's : Needs assistance/impaired Eating/Feeding: Minimal assistance;Sitting   Grooming: Minimal assistance;Bed level;Oral care   Upper Body Bathing: Moderate assistance;Sitting   Lower Body Bathing: Maximal assistance;Sit to/from stand   Upper Body Dressing : Minimal assistance;Sitting   Lower Body Dressing: Maximal assistance;Sit to/from stand                 General ADL Comments: Pt able to perform sit to stand from EOB with mod assist; once in standing pt only able to weight shift toward R side then with LOB backward onto bed. Pt reports she feels tired and weaker than this AM.     Vision Vision Assessment?: No apparent visual deficits   Perception     Praxis      Pertinent Vitals/Pain Pain Assessment: No/denies pain     Hand Dominance Right   Extremity/Trunk Assessment Upper Extremity Assessment Upper Extremity Assessment: RUE deficits/detail RUE Deficits / Details: Limited AROM with shoulder and elbow flexion; full PROM. Shoulder flex/ext 2/5, elbow 4/5. Decreased grip strength. Pt reports no sensation deficits. Decreased fine/gross motor coordination noted. RUE Coordination: decreased fine motor;decreased gross motor   Lower Extremity Assessment Lower Extremity Assessment: Defer to PT evaluation   Cervical / Trunk Assessment Cervical / Trunk Assessment: Other exceptions   Communication Communication Communication: Expressive difficulties   Cognition Arousal/Alertness: Awake/alert Behavior During Therapy: Anxious (Tearful) Overall Cognitive Status: Within Functional Limits for tasks  assessed                     General Comments       Exercises       Shoulder Instructions      Home Living Family/patient expects to be discharged to:: Private residence Living Arrangements:  Spouse/significant other Available Help at Discharge: Family Type of Home: House Home Access: Stairs to enter Technical brewer of Steps: 3 Entrance Stairs-Rails: None Home Layout: One level     Bathroom Shower/Tub: Occupational psychologist: Handicapped height     [place]: None          Prior Functioning/Environment Level of Independence: Independent        Comments: Indep with all household/community mobility; + driving; denies fall history    OT Diagnosis: Generalized weakness;Hemiplegia dominant side   OT Problem List: Decreased strength;Decreased range of motion;Decreased activity tolerance;Impaired balance (sitting and/or standing);Decreased coordination;Decreased safety awareness;Decreased knowledge of use of DME or AE;Decreased knowledge of precautions;Impaired tone;Impaired UE functional use   OT Treatment/Interventions: Self-care/ADL training;Therapeutic exercise;Neuromuscular education;Energy conservation;DME and/or AE instruction;Therapeutic activities;Patient/family education;Balance training    OT Goals(Current goals can be found in the care plan section) Acute Rehab OT Goals Patient Stated Goal: get back to being independent. OT Goal Formulation: With patient Time For Goal Achievement: 07/13/15 Potential to Achieve Goals: Good ADL Goals Pt Will Perform Grooming: with set-up;sitting Pt Will Perform Upper Body Bathing: with supervision;sitting Pt Will Perform Lower Body Bathing: sit to/from stand;with min assist Pt Will Transfer to Toilet: with mod assist;ambulating;bedside commode (over toilet) Pt Will Perform Toileting - Clothing Manipulation and hygiene: sit to/from stand;with min assist Pt/caregiver will Perform Home Exercise Program: Increased ROM;Increased strength;Right Upper extremity;Independently;With written HEP provided (increase fine/gross motor coordination)  OT Frequency: Min 2X/week   Barriers to D/C:             Co-evaluation              End of Session Equipment Utilized During Treatment: Gait belt;Rolling walker  Activity Tolerance: Patient limited by fatigue Patient left: in bed;with call bell/phone within reach;with bed alarm set   Time: 1437-1500 OT Time Calculation (min): 23 min Charges:  OT General Charges $OT Visit: 1 Procedure OT Evaluation $OT Eval Moderate Complexity: 1 Procedure OT Treatments $Self Care/Home Management : 8-22 mins G-Codes:     Binnie Kand M.S., OTR/L Pager: [phone]  06/29/2015, 3:14 PM

## 2015-06-29 NOTE — Consult Note (Signed)
Physical Medicine and Rehabilitation Consult Reason for Consult: Left basal ganglia infarct Referring Physician: [facility]   HPI: Karen Bowman is a 75 y.o. right handed female with history of hypertension, systolic congestive heart failure, atrial fibrillation maintained on Eliquis. Patient lives with spouse independent prior to admission. One level home with 3 steps to entry. Presented 06/28/2015 with right-sided weakness and slurred speech. MRI of the brain showed acute left basal ganglia infarct. MRA of the head with no large vessel occlusion or stenosis. Patient did not receive TPA. Echocardiogram with ejection fraction 60% no wall motion abnormalities. CTA angiogram of head and neck with moderate to severe bilateral proximal PCA stenosis. Neurology follow-up currently remains on Eliquis as well as low-dose aspirin. Tolerating a regular diet. Physical therapy evaluation completed with recommendations of physical medicine rehabilitation consult.   Review of Systems  Constitutional: Negative for fever and chills.  HENT: Negative for hearing loss.   Eyes: Negative for blurred vision and double vision.  Respiratory: Negative for cough and shortness of breath.   Cardiovascular: Positive for palpitations and leg swelling. Negative for chest pain.  Gastrointestinal: Positive for constipation. Negative for nausea and vomiting.  Genitourinary: Negative for dysuria and hematuria.  Musculoskeletal: Positive for myalgias and joint pain.  Skin: Negative for rash.  Neurological: Positive for speech change. Negative for headaches.  Psychiatric/Behavioral:       Anxiety  All other systems reviewed and are negative.  Past Medical History  Diagnosis Date  . Hypertension   . Heart murmur   . Anxiety   . Arrhythmia   . Arthritis   . Atrial fibrillation ([place])   . Acute heart failure ([place])   . Hyperlipemia    Past Surgical History  Procedure Laterality Date  . Tonsillectomy    .  Appendectomy    . Tumor removal      right arm  . Bladder suspension    . Abdominal hysterectomy    . Tee without cardioversion N/A 10/18/2014    Procedure: TRANSESOPHAGEAL ECHOCARDIOGRAM (TEE);  Surgeon: Thayer Headings, MD;  Location: [facility] ORS;  Service: Cardiovascular;  Laterality: N/A;  . Electrophysiologic study N/A 10/18/2014    Procedure: CARDIOVERSION;  Surgeon: Thayer Headings, MD;  Location: [facility] ORS;  Service: Cardiovascular;  Laterality: N/A;   Family History  Problem Relation Age of Onset  . Osteoporosis Mother   . Heart disease Mother   . Cancer Father   . Diabetes Maternal Aunt    Social History:  reports that she has never smoked. She has never used smokeless tobacco. She reports that she does not drink alcohol or use illicit drugs. Allergies:  Allergies  Allergen Reactions  . Ciprofloxacin     REACTION: dizzy  . Ciprofloxacin Nausea And Vomiting  . Hydrochlorothiazide Nausea And Vomiting  . Hydrochlorothiazide W-Triamterene     REACTION: nausea  . Sulfa Antibiotics Nausea And Vomiting  . Sulfonamide Derivatives     REACTION: nausea \\T \ vomiting  . Valsartan     REACTION: angioedema - probable  . Valsartan Nausea And Vomiting   Medications Prior to Admission  Medication Sig Dispense Refill  . amiodarone (PACERONE) 200 MG tablet Take 1 tablet (200 mg total) by mouth daily. (Patient taking differently: Take 100 mg by mouth daily. ) 90 tablet 3  . amLODipine (NORVASC) 2.5 MG tablet Take 1 tablet (2.5 mg total) by mouth daily. 90 tablet 3  . apixaban (ELIQUIS) 5 MG TABS tablet Take 1 tablet (5  mg total) by mouth 2 (two) times daily. 180 tablet 3  . BEE POLLEN PO Take 2 tablets by mouth daily.     . Cholecalciferol 1000 UNITS capsule Take 2,000 Units by mouth daily.     Marland Kitchen CINNAMON PO Take 2 tablets by mouth daily.     . Coenzyme Q10-Fish Oil-Vit E (CO-Q 10 OMEGA-3 FISH OIL) CAPS Take 1 capsule by mouth 2 (two) times daily.    Marland Kitchen CRANBERRY PO Take 1 tablet by mouth 2  (two) times daily.     Marland Kitchen DANDELION PO Take 1 tablet by mouth 2 (two) times daily.    Marland Kitchen EYEBRIGHT PO Take 1 tablet by mouth daily.    Marland Kitchen Hyaluronic Acid-Vitamin C (HYALURONIC ACID PO) Take 2 tablets by mouth daily.    . L-GLUTAMINE PO Take 1 tablet by mouth 2 (two) times daily.     . metoprolol tartrate (LOPRESSOR) 25 MG tablet Take 0.5 tablets (12.5 mg total) by mouth 2 (two) times daily. 90 tablet 3  . MILK THISTLE PO Take 1 tablet by mouth daily.     Marland Kitchen MISC NATURAL PRODUCTS PO Take 1 tablet by mouth daily. Advanced BP Support-Dr.Sinatra ([place]'s #1 integrated cardiologist)    . MISC NATURAL PRODUCTS PO Take 1 tablet by mouth daily. Multivitamin-Dr. Talmage Coin    . MISC NATURAL PRODUCTS PO Take 4 tablets by mouth daily. [employer]    . MISC NATURAL PRODUCTS PO Take 1 tablet by mouth daily. Ashwagandha    . MISC NATURAL PRODUCTS PO Take 5 tablets by mouth daily. Bone-Up by Gus Height    . MISC NATURAL PRODUCTS PO Advanced Glucosamine Chondroitin-MSM Natures Plus Ultra Rx Joint Liquid 1/2 capful per day    . MISC NATURAL PRODUCTS PO Take 2 tablets by mouth daily. Curcumin    . OVER THE COUNTER MEDICATION Take 2 tablets by mouth daily. Omega Q Plus w/resveratrol-Dr. Talmage Coin    . Probiotic CAPS Take 2 capsules by mouth daily.    . Probiotic Product (PROBIOTIC DAILY PO) Dr. Gertie Exon Probiotics one tablet daily    . QUERCETIN PO Take 1-2 tablets by mouth daily. w/bromelain    . ROYAL JELLY PO Take 1 tablet by mouth daily.    Marland Kitchen RUTIN PO Take 1 capsule by mouth 2 (two) times daily.    . betamethasone dipropionate (DIPROLENE) 0.05 % cream Apply topically 2 (two) times daily. (Patient not taking: Reported on 06/27/2015) 30 g 3  . estradiol (ESTRACE) 0.1 MG/GM vaginal cream Place 1 Applicatorful vaginally once a week. (Patient not taking: Reported on 06/27/2015) 42.5 g 12    Home: Home Living Family/patient expects to be discharged to:: Private residence Living Arrangements: Spouse/significant  other Available Help at Discharge: Family Type of Home: House Home Access: Stairs to enter Technical brewer of Steps: 3 Entrance Stairs-Rails: None Home Layout: One level Bathroom Shower/Tub: Multimedia programmer: Handicapped height Home Equipment: None  Functional History: Prior Function Level of Independence: Independent Comments: Indep with all household/community mobility; + driving; denies fall history Functional Status:  Mobility: Bed Mobility Overal bed mobility: Needs Assistance Bed Mobility: Supine to Sit Supine to sit: Min assist General bed mobility comments: HOB flat, no rail; very effortful and incr time; required assist to move RLE over EOB Transfers Overall transfer level: Needs assistance Equipment used: None Transfers: Sit to/from Stand, [employer], Squat Pivot Transfers Sit to Stand: Mod assist Stand pivot transfers: Mod assist Squat pivot transfers: Mod assist General transfer comment: Stand-pivot  x 2 to her Lt with Rt knee buckling and assist for balance and to pivot; squat-pivot to her Rt (onto [facility])  Ambulation/Gait General Gait Details: unable    ADL:    Cognition: Cognition Overall Cognitive Status: Within Functional Limits for tasks assessed Orientation Level: Oriented X4 Cognition Arousal/Alertness: Awake/alert Behavior During Therapy: Anxious, Impulsive Overall Cognitive Status: Within Functional Limits for tasks assessed  Blood pressure 136/81, pulse 76, temperature 98.3 F (36.8 C), temperature source Oral, resp. rate 16, height 5' 5.5" (1.664 m), weight 57.153 kg (126 lb), SpO2 99 %. Physical Exam  Constitutional: She is oriented to person, place, and time. She appears well-developed.  HENT:  Right facial droop  Eyes: EOM are normal.  Neck: Normal range of motion. Neck supple. No thyromegaly present.  Cardiovascular:  Cardiac rate control  Respiratory: Breath sounds normal. No respiratory distress.  GI:  Soft. Bowel sounds are normal. She exhibits no distension.  Neurological: She is alert and oriented to person, place, and time.  Moderate dysarthria. Fair awareness of deficits  Skin: Skin is warm and dry.  Motor strength is 2 minus at the right deltoid, 3 minus at the biceps triceps and finger flexors as well as finger extensors 3 minus in the right hip flexor and knee extensor and ankle dorsiflexor 5/5 in the left deltoid, biceps, triceps, grip, hip flexor, knee extensor, ankle dorsiflexor and plantar flexor Sensation is intact to light touch in bilateral upper and lower limbs Tone shows no evidence of spasticity deep tendon reflexes are 3+ on the right and 2+ on the left  No results found for this or any previous visit (from the past 24 hour(s)). Ct Angio Head W/cm &/or Wo Cm  06/28/2015  CLINICAL DATA:  Right leg weakness and difficulty walking. Posterior headaches. Acute left basal ganglia infarct on MRI. EXAM: CT ANGIOGRAPHY HEAD AND NECK TECHNIQUE: Multidetector CT imaging of the head and neck was performed using the standard protocol during bolus administration of intravenous contrast. Multiplanar CT image reconstructions and MIPs were obtained to evaluate the vascular anatomy. Carotid stenosis measurements (when applicable) are obtained utilizing NASCET criteria, using the distal internal carotid diameter as the denominator. CONTRAST:  1mL OMNIPAQUE IOHEXOL 350 MG/ML SOLN COMPARISON:  Head MRI/ MRA earlier today FINDINGS: CTA NECK Aortic arch: Normal variant aortic arch branching pattern with common origin of the brachiocephalic and left common carotid arteries. Noncalcified plaque in the proximal left subclavian artery results in less than 50% stenosis. No brachiocephalic or right subclavian artery stenosis. Right carotid system: Patent with mild non stenotic plaque at the carotid bifurcation. Tortuous distal cervical ICA. Left carotid system: Patent with mild non stenotic plaque at the common  carotid artery origin and at the carotid bifurcation. Vertebral arteries: The vertebral arteries are patent with the right being dominant. No vertebral artery stenosis is identified. Left V1 assessment is slightly limited by surrounding dense venous contrast. Skeleton: Mild cervical spondylosis. Other neck: Mild enlargement of the right greater than left thyroid lobes with multiple small predominantly hypoattenuating nodules suggestive of multinodular goiter. CTA HEAD Anterior circulation: The internal carotid arteries are patent from skullbase to carotid termini. There is mild carotid siphon atherosclerosis bilaterally, and there is mild narrowing of the anterior cavernous and proximal supraclinoid segments on the right. A small infundibulum is noted at the right posterior communicating artery origin. There is also a 2 mm outpouching in the region of the left posterior communicating artery origin also favored to reflect a small infundibulum. The right  A1 segment is absent. The left A1 segment is widely patent and supplies the right ACA. MCAs are patent without evidence of significant proximal stenosis or major branch vessel occlusion. Mild bilateral MCA branch vessel irregularity suggests atherosclerosis. Posterior circulation: The intracranial vertebral arteries are patent with the right being dominant. The left vertebral artery functionally ends in PICA. PICA and SCA origins are patent. Basilar artery is patent without stenosis. There is a fetal type origin of the right PCA with hypoplastic P1 segment. There is mild narrowing of the mid to distal right posterior communicating artery, and there is severe stenosis of the proximal P2 segment. There is also a focal, moderate to severe left PCA stenosis involving the distal P1 segment. Venous sinuses: Patent. Anatomic variants: Absent right A1. Delayed phase: Mild edema is noted in corresponding to the acute left basal ganglia infarct, most conspicuous in the  posterior putamen. There is no evidence of acute intracranial hemorrhage or abnormal enhancement. IMPRESSION: 1. Moderate to severe bilateral proximal PCA stenoses. 2. Mild right cavernous and supraclinoid ICA stenosis. 3. No cervical carotid or vertebral artery stenosis. Electronically Signed   By: Logan Bores M.D.   On: 06/28/2015 12:03   Dg Chest 2 View  06/28/2015  CLINICAL DATA:  Recent stroke and weakness EXAM: CHEST  2 VIEW COMPARISON:  10/18/2014 FINDINGS: Cardiac shadow is enlarged. Aortic calcifications are again noted. The previously seen effusions have resolved in the interval. No focal infiltrate or sizable effusion is seen. No bony abnormality seen. IMPRESSION: No acute abnormality noted. Electronically Signed   By: Inez Catalina M.D.   On: 06/28/2015 15:34   Ct Head Wo Contrast  06/27/2015  ADDENDUM REPORT: 06/27/2015 22:28 ADDENDUM: These results were called by telephone at the time of interpretation on 06/27/2015 at 10:28 pm to Dr. Leonel Ramsay, who verbally acknowledged these results. Electronically Signed   By: Anner Crete M.D.   On: 06/27/2015 22:28  06/27/2015  CLINICAL DATA:  75 year old female with right-sided weakness and slurred speech. Code stroke. EXAM: CT HEAD WITHOUT CONTRAST TECHNIQUE: Contiguous axial images were obtained from the base of the skull through the vertex without intravenous contrast. COMPARISON:  None. FINDINGS: There is mild prominence of the ventricles and sulci compatible with age-related atrophy. Minimal periventricular and deep white matter chronic microvascular ischemic changes noted. There is no acute intracranial hemorrhage. No mass effect or midline shift. A 5 mm focal hyperdense focus along the M1 segment of the left MCA (series 201, image 13) may be artifactual. A left MCA thrombus is not excluded. Correlation with clinical exam recommended. CT angiography may provide additional evaluation. The visualized paranasal sinuses and the mastoid air cells are  clear. The calvarium is intact. IMPRESSION: No acute intracranial hemorrhage. Artifact versus less likely a small focal left MCA thrombus. Clinical correlation recommended. CT angiography may provide better evaluation. Electronically Signed: By: Anner Crete M.D. On: 06/27/2015 22:23   Ct Angio Neck W/cm &/or Wo/cm  06/28/2015  CLINICAL DATA:  Right leg weakness and difficulty walking. Posterior headaches. Acute left basal ganglia infarct on MRI. EXAM: CT ANGIOGRAPHY HEAD AND NECK TECHNIQUE: Multidetector CT imaging of the head and neck was performed using the standard protocol during bolus administration of intravenous contrast. Multiplanar CT image reconstructions and MIPs were obtained to evaluate the vascular anatomy. Carotid stenosis measurements (when applicable) are obtained utilizing NASCET criteria, using the distal internal carotid diameter as the denominator. CONTRAST:  70mL OMNIPAQUE IOHEXOL 350 MG/ML SOLN COMPARISON:  Head MRI/ MRA earlier  today FINDINGS: CTA NECK Aortic arch: Normal variant aortic arch branching pattern with common origin of the brachiocephalic and left common carotid arteries. Noncalcified plaque in the proximal left subclavian artery results in less than 50% stenosis. No brachiocephalic or right subclavian artery stenosis. Right carotid system: Patent with mild non stenotic plaque at the carotid bifurcation. Tortuous distal cervical ICA. Left carotid system: Patent with mild non stenotic plaque at the common carotid artery origin and at the carotid bifurcation. Vertebral arteries: The vertebral arteries are patent with the right being dominant. No vertebral artery stenosis is identified. Left V1 assessment is slightly limited by surrounding dense venous contrast. Skeleton: Mild cervical spondylosis. Other neck: Mild enlargement of the right greater than left thyroid lobes with multiple small predominantly hypoattenuating nodules suggestive of multinodular goiter. CTA HEAD  Anterior circulation: The internal carotid arteries are patent from skullbase to carotid termini. There is mild carotid siphon atherosclerosis bilaterally, and there is mild narrowing of the anterior cavernous and proximal supraclinoid segments on the right. A small infundibulum is noted at the right posterior communicating artery origin. There is also a 2 mm outpouching in the region of the left posterior communicating artery origin also favored to reflect a small infundibulum. The right A1 segment is absent. The left A1 segment is widely patent and supplies the right ACA. MCAs are patent without evidence of significant proximal stenosis or major branch vessel occlusion. Mild bilateral MCA branch vessel irregularity suggests atherosclerosis. Posterior circulation: The intracranial vertebral arteries are patent with the right being dominant. The left vertebral artery functionally ends in PICA. PICA and SCA origins are patent. Basilar artery is patent without stenosis. There is a fetal type origin of the right PCA with hypoplastic P1 segment. There is mild narrowing of the mid to distal right posterior communicating artery, and there is severe stenosis of the proximal P2 segment. There is also a focal, moderate to severe left PCA stenosis involving the distal P1 segment. Venous sinuses: Patent. Anatomic variants: Absent right A1. Delayed phase: Mild edema is noted in corresponding to the acute left basal ganglia infarct, most conspicuous in the posterior putamen. There is no evidence of acute intracranial hemorrhage or abnormal enhancement. IMPRESSION: 1. Moderate to severe bilateral proximal PCA stenoses. 2. Mild right cavernous and supraclinoid ICA stenosis. 3. No cervical carotid or vertebral artery stenosis. Electronically Signed   By: Logan Bores M.D.   On: 06/28/2015 12:03   Mr Brain Wo Contrast  06/28/2015  CLINICAL DATA:  Right-sided weakness. EXAM: MRI HEAD WITHOUT CONTRAST MRA HEAD WITHOUT CONTRAST  TECHNIQUE: Multiplanar, multiecho pulse sequences of the brain and surrounding structures were obtained without intravenous contrast. Angiographic images of the head were obtained using MRA technique without contrast. COMPARISON:  Head CT 06/27/2015 FINDINGS: MRI HEAD FINDINGS There is an acute infarct involving the left lateral lenticulostriate territory extending from the posterior body of the caudate into the posterior putamen. There is no evidence of intracranial hemorrhage, mass, midline shift, or extra-axial fluid collection. Small foci of T2 hyperintensity scattered throughout the cerebral white matter bilaterally are nonspecific but compatible with mild chronic small vessel ischemic disease. There is mild generalized cerebral atrophy. Moderate chronic small vessel ischemic change is noted in the pons. Prior bilateral cataract extraction is noted. Tiny mucous retention cysts are noted in the maxillary sinuses. No significant mastoid fluid. Major intracranial vascular flow voids are preserved. MRA HEAD FINDINGS The study is mildly motion degraded. The visualized distal vertebral arteries are patent with the right  being strongly dominant. The left vertebral artery ends in PICA. Right PICA origin is patent. SCA origins are patent. Basilar artery is patent without stenosis. There is a fetal type origin of the right PCA. There is evidence of a severe proximal right P2 stenosis, with assessment mildly limited by motion artifact through this area. There may be mild stenosis near the left P1 - P2 junction. Internal carotid arteries are patent from skullbase to carotid termini without evidence of significant stenosis. The right A1 segment is absent. The left A1 segment is widely patent and supplies the right ACA. MCAs are patent without evidence of significant proximal stenosis or major branch occlusion. No intracranial aneurysm is identified. IMPRESSION: 1. Acute left basal ganglia infarct. 2. Mild chronic small  vessel ischemic disease and cerebral atrophy. 3. No large vessel occlusion or significant proximal anterior circulation stenosis. 4. Severe right P2 PCA stenosis. Electronically Signed   By: Logan Bores M.D.   On: 06/28/2015 11:15   Mr Jodene Nam Head/brain Wo Cm  06/28/2015  CLINICAL DATA:  Right-sided weakness. EXAM: MRI HEAD WITHOUT CONTRAST MRA HEAD WITHOUT CONTRAST TECHNIQUE: Multiplanar, multiecho pulse sequences of the brain and surrounding structures were obtained without intravenous contrast. Angiographic images of the head were obtained using MRA technique without contrast. COMPARISON:  Head CT 06/27/2015 FINDINGS: MRI HEAD FINDINGS There is an acute infarct involving the left lateral lenticulostriate territory extending from the posterior body of the caudate into the posterior putamen. There is no evidence of intracranial hemorrhage, mass, midline shift, or extra-axial fluid collection. Small foci of T2 hyperintensity scattered throughout the cerebral white matter bilaterally are nonspecific but compatible with mild chronic small vessel ischemic disease. There is mild generalized cerebral atrophy. Moderate chronic small vessel ischemic change is noted in the pons. Prior bilateral cataract extraction is noted. Tiny mucous retention cysts are noted in the maxillary sinuses. No significant mastoid fluid. Major intracranial vascular flow voids are preserved. MRA HEAD FINDINGS The study is mildly motion degraded. The visualized distal vertebral arteries are patent with the right being strongly dominant. The left vertebral artery ends in PICA. Right PICA origin is patent. SCA origins are patent. Basilar artery is patent without stenosis. There is a fetal type origin of the right PCA. There is evidence of a severe proximal right P2 stenosis, with assessment mildly limited by motion artifact through this area. There may be mild stenosis near the left P1 - P2 junction. Internal carotid arteries are patent from  skullbase to carotid termini without evidence of significant stenosis. The right A1 segment is absent. The left A1 segment is widely patent and supplies the right ACA. MCAs are patent without evidence of significant proximal stenosis or major branch occlusion. No intracranial aneurysm is identified. IMPRESSION: 1. Acute left basal ganglia infarct. 2. Mild chronic small vessel ischemic disease and cerebral atrophy. 3. No large vessel occlusion or significant proximal anterior circulation stenosis. 4. Severe right P2 PCA stenosis. Electronically Signed   By: Logan Bores M.D.   On: 06/28/2015 11:15    Assessment/Plan: Diagnosis: Right hemiparesis secondary to left basal ganglia infarct, gait disorder, dysarthria 1. Does the need for close, 24 hr/day medical supervision in concert with the patient's rehab needs make it unreasonable for this patient to be served in a less intensive setting? Yes 2. Co-Morbidities requiring supervision/potential complications: Atrial fibrillation, hypertension, congestive heart failure 3. Due to bladder management, bowel management, safety, skin/wound care, disease management, medication administration, pain management and patient education, does the patient require  24 hr/day rehab nursing? Yes 4. Does the patient require coordinated care of a physician, rehab nurse, PT (1-2 hrs/day, 5 days/week), OT (1-2 hrs/day, 5 days/week) and SLP (0.5-1 hrs/day, 5 days/week) to address physical and functional deficits in the context of the above medical diagnosis(es)? Yes Addressing deficits in the following areas: balance, endurance, locomotion, strength, transferring, bowel/bladder control, bathing, dressing, feeding, grooming, toileting, cognition, speech and psychosocial support 5. Can the patient actively participate in an intensive therapy program of at least 3 hrs of therapy per day at least 5 days per week? Yes 6. The potential for patient to make measurable gains while on  inpatient rehab is excellent 7. Anticipated functional outcomes upon discharge from inpatient rehab are modified independent and supervision  with PT, modified independent and supervision with OT, modified independent and supervision with SLP. 8. Estimated rehab length of stay to reach the above functional goals is: 10-14 days 9. Does the patient have adequate social supports and living environment to accommodate these discharge functional goals? Yes 10. Anticipated D/C setting: Home 11. Anticipated post D/C treatments: Strong therapy 12. Overall Rehab/Functional Prognosis: excellent  RECOMMENDATIONS: This patient's condition is appropriate for continued rehabilitative care in the following setting: CIR Patient has agreed to participate in recommended program. Yes Note that insurance prior authorization may be required for reimbursement for recommended care.  Comment:     06/29/2015

## 2015-06-29 NOTE — Evaluation (Signed)
Physical Therapy Evaluation Patient Details Name: Karen Bowman MRN: [id number] DOB: January 17, 1941 Today's Date: 06/29/2015   History of Present Illness  Adm Rt sided weakness; MRI + Lt BG CVA PMHx-afib, HTN, CHF,   Clinical Impression  Pt admitted with above diagnosis. Pt currently with functional limitations due to the deficits listed below (see PT Problem List). Patient very motivated, slightly anxious/impulsive, and should progress well with intensive therapies. Pt will benefit from skilled PT to increase their independence and safety with mobility to allow discharge to the venue listed below.       Follow Up Recommendations CIR    Equipment Recommendations  Other (comment) (TBA at next venue)    Recommendations for Other Services Rehab consult;OT consult;Speech consult     Precautions / Restrictions Precautions Precautions: Fall      Mobility  Bed Mobility Overal bed mobility: Needs Assistance Bed Mobility: Supine to Sit     Supine to sit: Min assist     General bed mobility comments: HOB flat, no rail; very effortful and incr time; required assist to move RLE over EOB  Transfers Overall transfer level: Needs assistance Equipment used: None Transfers: Sit to/from [employer] Transfers Sit to Stand: Mod assist Stand pivot transfers: Mod assist Squat pivot transfers: Mod assist     General transfer comment: Stand-pivot x 2 to her Lt with Rt knee buckling and assist for balance and to pivot; squat-pivot to her Rt (onto [facility])   Ambulation/Gait             General Gait Details: unable  Stairs            Wheelchair Mobility    Modified Rankin (Stroke Patients Only) Modified Rankin (Stroke Patients Only) Pre-Morbid Rankin Score: No symptoms Modified Rankin: Severe disability     Balance Overall balance assessment: Needs assistance Sitting-balance support: Feet supported;No upper extremity supported Sitting  balance-Leahy Scale: Fair Sitting balance - Comments: initial Rt lean, however likely due to mattress position; once adjusted maintained midline with no assist   Standing balance support: Single extremity supported;During functional activity Standing balance-Leahy Scale: Poor                               Pertinent Vitals/Pain Pain Assessment: No/denies pain    Home Living Family/patient expects to be discharged to:: Private residence Living Arrangements: Spouse/significant other Available Help at Discharge: Family Type of Home: House Home Access: Stairs to enter Entrance Stairs-Rails: None Entrance Stairs-Number of Steps: 3 Home Layout: One level Home Equipment: None      Prior Function Level of Independence: Independent         Comments: Indep with all household/community mobility; + driving; denies fall history     Hand Dominance   Dominant Hand: Right    Extremity/Trunk Assessment   Upper Extremity Assessment: Defer to OT evaluation (noted AROM throughout RUE)           Lower Extremity Assessment: RLE deficits/detail RLE Deficits / Details: AAROM WNL; hip flexion/extension 3-, knee extensin 3-, ankle DF 3+    Cervical / Trunk Assessment: Other exceptions  Communication   Communication: Expressive difficulties  Cognition Arousal/Alertness: Awake/alert Behavior During Therapy: Anxious;Impulsive Overall Cognitive Status: Within Functional Limits for tasks assessed                      General Comments      Exercises  Assessment/Plan    PT Assessment Patient needs continued PT services  PT Diagnosis Hemiplegia dominant side   PT Problem List Decreased strength;Decreased balance;Decreased mobility;Decreased knowledge of use of DME;Decreased safety awareness;Decreased knowledge of precautions;Impaired tone  PT Treatment Interventions DME instruction;Gait training;Stair training;Functional mobility training;Therapeutic  activities;Balance training;Neuromuscular re-education;Patient/family education   PT Goals (Current goals can be found in the Care Plan section) Acute Rehab PT Goals Patient Stated Goal: get her hand working PT Goal Formulation: With patient Time For Goal Achievement: 07/06/15 Potential to Achieve Goals: Good    Frequency Min 4X/week   Barriers to discharge        Co-evaluation               End of Session Equipment Utilized During Treatment: Gait belt Activity Tolerance: Patient tolerated treatment well Patient left: in chair;with call bell/phone within reach;with chair alarm set Nurse Communication: Mobility status;Other (comment) (toileted; stood for pericare )         Time: [id number] PT Time Calculation (min) (ACUTE ONLY): 40 min   Charges:   PT Evaluation $PT Eval Moderate Complexity: 1 Procedure PT Treatments $Therapeutic Activity: 23-37 mins   PT G Codes:        Bohden Dung 07/16/15, 10:25 AM Pager [phone]

## 2015-06-29 NOTE — Progress Notes (Signed)
Inpatient Rehabilitation  Per PT request, patient was screened by Gunnar Fusi for appropriateness for an Inpatient Acute Rehab consult.  At this time we are recommending an Inpatient Rehab consult.  MD paged, please order if you are agreeable.    Carmelia Roller., CCC/SLP Admission Coordinator  [place]  Cell [phone]

## 2015-06-29 NOTE — Progress Notes (Signed)
[facility] Progress Note  Patient: Karen Bowman [id number]   PCP: [name], MD DOB: 07/13/1940   DOA: 06/27/2015   DOS: 06/29/2015   Date of Service: the patient was seen and examined on 06/29/2015  Subjective: pt has been showing improvement in her symptoms. Denies any acute chest pain, abdominal pain or nausea or vomiting Nutrition: tolerating oral diet  Brief hospital course: Patient was admitted on 06/27/2015, with complaint of right sided weakness, was found to have left basal ganglia infacrt. Currently further plan is awaiting recommendation from CIR.  Assessment and Plan: 1. Cerebrovascular accident (CVA) due to occlusion of cerebral artery (HCC) Right basal ganglia infarct on CT scan. Moderate to severe bilateral proximal PCA stenosis  Echocardiogram ejection fraction 0000000, diastolic dysfunction  LDL 195 hemoglobin A1c 5.7  Neurology consult recommends to add aspirin to patient's Eliquis. PTOT recommends CIR, consult currently pending. Patient is gradually improving regarding her weakness  2. Chronic A. Fib Chronic combined CHF Essential hypertension. Continue amlodipine, continue beta blocker. It is currently controlled. Continue Eliquis. Continue amiodarone  Activity: physical therapy CIR Bowel regimen: last BM 06/28/2015 DVT Prophylaxis: on therapeutic anticoagulation. Nutrition: Cardiac diet Advance goals of care discussion: Full code  HPI: As per the H and P dictated on admission, "Karen Bowman is a 75 y.o. female with history of atrial fibrillation, hypertension, systolic heart failure presents to the ER because of right-sided weakness. Patient started experiencing right-sided upper extremity weakness last evening around 4 PM. The weakness persisted and patient later called EMS and patient was brought to the ER. EMS also noticed that patient had right lower extremity weakness in addition to the right upper extremity. In the ER patient was  counseled by the neurologist and CD8 was unremarkable. Patient is on Apixaban and is not a candidate for TPA. On exam patient has right-sided facial droop and some difficulty expressing words and right upper and lower extremity weakness. Denies any blurred vision or difficulty swallowing." Procedures: Echocardiogram Consultants: Neurology Antibiotics: Anti-infectives    None       Family Communication: no family was present at bedside, at the time of interview.   Disposition:  Expected discharge date:06/30/2015 Barriers to safe discharge: Recommendation from CIR   Intake/Output Summary (Last 24 hours) at 06/29/15 1538 Last data filed at 06/29/15 1356  Gross per 24 hour  Intake    240 ml  Output      0 ml  Net    240 ml   Filed Weights   06/27/15 2216  Weight: 57.153 kg (126 lb)    Objective: Physical Exam: Filed Vitals:   06/29/15 0119 06/29/15 0535 06/29/15 0946 06/29/15 1357  BP: 139/94 131/91 136/81 157/85  Pulse: 78 97 76 75  Temp: 97.9 F (36.6 C) 97.7 F (36.5 C) 98.3 F (36.8 C) 98.1 F (36.7 C)  TempSrc: Oral Oral Oral Oral  Resp: 16 18 16 16   Height:      Weight:      SpO2: 99% 97% 99% 97%     General: Appear in mild distress, no Rash; Oral Mucosa moist. Cardiovascular: S1 and S2 Present, no Murmur, no JVD Respiratory: Bilateral Air entry present and Clear to Auscultation, no Crackles, no wheezes Abdomen: Bowel Sound present, Soft and no tenderness Extremities: no Pedal edema, no calf tenderness Neurology: Dysarthria, right facial droop, right-sided weakness  Data Reviewed: CBC:  Recent Labs Lab 06/27/15 2201 06/27/15 2208 06/28/15 0200 06/28/15 0300  WBC 5.9  --  5.5 5.5  NEUTROABS 4.5  --   --   --   HGB 14.0 16.0* 13.4 13.4  HCT 42.6 47.0* 40.4 40.8  MCV 96.2  --  95.5 95.3  PLT 182  --  182 [id number]   Basic Metabolic Panel:  Recent Labs Lab 06/27/15 2201 06/27/15 2208 06/28/15 0200 06/28/15 0300  NA 139 138  --  138  K 3.7 3.6  --   3.7  CL 104 101  --  104  CO2 23  --   --  23  GLUCOSE 125* 118*  --  118*  BUN 14 19  --  10  CREATININE 0.78 0.70 0.68 0.66  CALCIUM 9.4  --   --  9.2   Liver Function Tests:  Recent Labs Lab 06/27/15 2201 06/28/15 0300  AST 30 31  ALT 22 21  ALKPHOS 65 62  BILITOT 0.5 0.8  PROT 6.5 6.6  ALBUMIN 3.9 3.8   No results for input(s): LIPASE, AMYLASE in the last 168 hours. No results for input(s): AMMONIA in the last 168 hours.  Cardiac Enzymes: No results for input(s): CKTOTAL, CKMB, CKMBINDEX, TROPONINI in the last 168 hours.  BNP (last 3 results)  Recent Labs  10/14/14 1751  BNP 1018.0*    CBG:  Recent Labs Lab 06/27/15 2216  GLUCAP 103*    No results found for this or any previous visit (from the past 240 hour(s)).   Studies: No results found.   Scheduled Meds: . amiodarone  100 mg Oral Daily  . amLODipine  2.5 mg Oral Daily  . apixaban  5 mg Oral BID  . aspirin EC  81 mg Oral Daily  . atorvastatin  40 mg Oral q1800  . metoprolol tartrate  12.5 mg Oral BID   Continuous Infusions:  PRN Meds:   Time spent: 30 minutes  Author: Berle Mull, MD [facility] Pager: [phone] 06/29/2015 3:38 PM  If 7PM-7AM, please contact night-coverage at [email or web address], password [facility]

## 2015-06-30 NOTE — Progress Notes (Signed)
Physical Therapy Treatment Patient Details Name: VIRGINIE ROTAR MRN: [id number] DOB: January 07, 1941 Today's Date: 06/30/2015    History of Present Illness Adm Rt sided weakness; MRI + Lt BG CVA PMHx-afib, HTN, CHF,     PT Comments    Pt appears very anxious so performed pregait activity prior to attempting ambulation.  Pt able to ambulate very short distance with moderate assist, increased times, and constant cues.  Pt reports sitting in recliner for 2 hours this morning so assisted back to bed and performed a couple R LE exercises.   Follow Up Recommendations  CIR     Equipment Recommendations  Other (comment) (TBA next venue)    Recommendations for Other Services Rehab consult;OT consult;Speech consult     Precautions / Restrictions Precautions Precautions: Fall    Mobility  Bed Mobility Overal bed mobility: Needs Assistance Bed Mobility: Supine to Sit;Sit to Supine     Supine to sit: Min guard Sit to supine: Min assist   General bed mobility comments: pt assisted R UE positioning with L UE and increased time to scoot R LE over EOB, assist for R LE onto bed due to fatigue  Transfers Overall transfer level: Needs assistance Equipment used: Rolling walker (2 wheeled) Transfers: Sit to/from Stand Sit to Stand: Mod assist;+2 safety/equipment         General transfer comment: assist to rise and steady, pregait activity of lateral weight shifting onto each LE and then stepping forward with R LE placing weight and stepping back to neutral performed prior to attempting gait  Ambulation/Gait Ambulation/Gait assistance: Mod assist;+2 safety/equipment Ambulation Distance (Feet): 8 Feet Assistive device: Rolling walker (2 wheeled) Gait Pattern/deviations: Step-to pattern;Decreased stance time - right;Decreased weight shift to right;Narrow base of support Gait velocity: very slow   General Gait Details: verbal cues for sequence, RW positioning, weight shifting, pt tends to  remain on L side of RW however able to correct with cues, mod assist for balance with occasional increased leaning on right side   Stairs            Wheelchair Mobility    Modified Rankin (Stroke Patients Only)       Balance           Standing balance support: Bilateral upper extremity supported Standing balance-Leahy Scale: Poor                      Cognition Arousal/Alertness: Awake/alert Behavior During Therapy: Anxious (Tearful) Overall Cognitive Status: Within Functional Limits for tasks assessed                      Exercises General Exercises - Lower Extremity Heel Slides: AROM;Right;10 reps Hip ABduction/ADduction: AROM;Right;10 reps;Other (comment) (small range due to limited strength) Straight Leg Raises: AROM;Right;10 reps    General Comments        Pertinent Vitals/Pain Pain Assessment: No/denies pain    Home Living                      Prior Function            PT Goals (current goals can now be found in the care plan section) Progress towards PT goals: Progressing toward goals    Frequency  Min 4X/week    PT Plan Current plan remains appropriate    Co-evaluation             End of Session Equipment Utilized During Treatment: Gait belt Activity  Tolerance: Patient tolerated treatment well Patient left: in bed;with call bell/phone within reach;with bed alarm set     Time: [id number] PT Time Calculation (min) (ACUTE ONLY): 21 min  Charges:  $Gait Training: 8-22 mins                    G Codes:      Zaliyah Meikle,KATHrine E 07/06/2015, 10:17 AM Carmelia Bake, PT, DPT 06-Jul-2015 Pager: [phone]

## 2015-06-30 NOTE — Care Management Important Message (Signed)
Important Message  Patient Details  Name: Karen Bowman MRN: [id number] Date of Birth: [birth year]   Medicare Important Message Given:  Yes    Barb Merino Louisa 06/30/2015, 12:18 PM

## 2015-06-30 NOTE — Progress Notes (Signed)
[facility] Progress Note  Patient: Karen Bowman [id number]   PCP: [name], MD DOB: 02-19-1941   DOA: 06/27/2015   DOS: 06/30/2015   Date of Service: the patient was seen and examined on 06/30/2015  Subjective: Patient denies any acute complaint. No chest pain and abdominal pain and nausea no vomiting Nutrition: tolerating oral diet  Brief hospital course: Patient was admitted on 06/27/2015, with complaint of right sided weakness, was found to have left basal ganglia infacrt. Currently further plan is awaiting recommendation from CIR.  Assessment and Plan: 1. Cerebrovascular accident (CVA) due to occlusion of cerebral artery (HCC) Right basal ganglia infarct on CT scan. Moderate to severe bilateral proximal PCA stenosis  Echocardiogram ejection fraction 0000000, diastolic dysfunction  LDL 195 hemoglobin A1c 5.7  Neurology consult recommends to add aspirin to patient's Eliquis. PTOT recommends CIR, awaiting arrangements Patient is gradually improving regarding her weakness  2. Chronic A. Fib Chronic combined CHF Essential hypertension. Continue amlodipine, continue beta blocker. It is currently controlled. Continue Eliquis. Continue amiodarone  Activity: physical therapy CIR Bowel regimen: last BM 06/28/2015 DVT Prophylaxis: on therapeutic anticoagulation. Nutrition: Cardiac diet Advance goals of care discussion: Full code  HPI: As per the H and P dictated on admission, "Karen Bowman is a 75 y.o. female with history of atrial fibrillation, hypertension, systolic heart failure presents to the ER because of right-sided weakness. Patient started experiencing right-sided upper extremity weakness last evening around 4 PM. The weakness persisted and patient later called EMS and patient was brought to the ER. EMS also noticed that patient had right lower extremity weakness in addition to the right upper extremity. In the ER patient was counseled by the neurologist and  CD8 was unremarkable. Patient is on Apixaban and is not a candidate for TPA. On exam patient has right-sided facial droop and some difficulty expressing words and right upper and lower extremity weakness. Denies any blurred vision or difficulty swallowing." Procedures: Echocardiogram Consultants: Neurology Antibiotics: Anti-infectives    None      Family Communication: no family was present at bedside, at the time of interview.   Disposition:  Expected discharge date:07/01/2015 Barriers to safe discharge: Recommendation from CIR   Intake/Output Summary (Last 24 hours) at 06/30/15 1724 Last data filed at 06/30/15 0800  Gross per 24 hour  Intake    600 ml  Output      0 ml  Net    600 ml   Filed Weights   06/27/15 2216  Weight: 57.153 kg (126 lb)    Objective: Physical Exam: Filed Vitals:   06/30/15 0215 06/30/15 0519 06/30/15 1039 06/30/15 1335  BP: 162/93 148/94 171/63 156/63  Pulse: 73 72 72 71  Temp: 97.5 F (36.4 C) 97.4 F (36.3 C) 98.4 F (36.9 C) 98.6 F (37 C)  TempSrc: Oral Oral Oral Oral  Resp: 18 18 18 18   Height:      Weight:      SpO2: 100% 97% 96% 96%    General: Appear in mild distress, no Rash; Oral Mucosa moist. Cardiovascular: S1 and S2 Present, no Murmur, no JVD Respiratory: Bilateral Air entry present and Clear to Auscultation, no Crackles, no wheezes Abdomen: Bowel Sound present, Soft and no tenderness Extremities: no Pedal edema, no calf tenderness Neurology: Improving Dysarthria, right facial droop, right-sided weakness  Data Reviewed: CBC:  Recent Labs Lab 06/27/15 2201 06/27/15 2208 06/28/15 0200 06/28/15 0300  WBC 5.9  --  5.5 5.5  NEUTROABS 4.5  --   --   --  HGB 14.0 16.0* 13.4 13.4  HCT 42.6 47.0* 40.4 40.8  MCV 96.2  --  95.5 95.3  PLT 182  --  182 [id number]   Basic Metabolic Panel:  Recent Labs Lab 06/27/15 2201 06/27/15 2208 06/28/15 0200 06/28/15 0300  NA 139 138  --  138  K 3.7 3.6  --  3.7  CL 104 101  --  104    CO2 23  --   --  23  GLUCOSE 125* 118*  --  118*  BUN 14 19  --  10  CREATININE 0.78 0.70 0.68 0.66  CALCIUM 9.4  --   --  9.2   Liver Function Tests:  Recent Labs Lab 06/27/15 2201 06/28/15 0300  AST 30 31  ALT 22 21  ALKPHOS 65 62  BILITOT 0.5 0.8  PROT 6.5 6.6  ALBUMIN 3.9 3.8   No results for input(s): LIPASE, AMYLASE in the last 168 hours. No results for input(s): AMMONIA in the last 168 hours.  Cardiac Enzymes: No results for input(s): CKTOTAL, CKMB, CKMBINDEX, TROPONINI in the last 168 hours.  BNP (last 3 results)  Recent Labs  10/14/14 1751  BNP 1018.0*    CBG:  Recent Labs Lab 06/27/15 2216  GLUCAP 103*    No results found for this or any previous visit (from the past 240 hour(s)).   Studies: No results found.   Scheduled Meds: . amiodarone  100 mg Oral Daily  . amLODipine  2.5 mg Oral Daily  . apixaban  5 mg Oral BID  . aspirin EC  81 mg Oral Daily  . atorvastatin  40 mg Oral q1800  . metoprolol tartrate  12.5 mg Oral BID   Continuous Infusions:  PRN Meds:   Time spent: 30 minutes  Author: Berle Mull, MD [facility] Pager: [phone] 06/30/2015 5:24 PM  If 7PM-7AM, please contact night-coverage at [email or web address], password [facility]

## 2015-06-30 NOTE — Progress Notes (Signed)
Rehab admissions - I met with patient, her husband and her son, Elta [place], at the bedside.  This morning I opened the case with [facility] and sent clinicals.  I gave patient rehab booklets and explained inpatient rehab.  Patient has 2 sons who live out of town.  Husband can assist and provide supervision at home.  Patient and family would like inpatient rehab admission.  I will have my partner follow up on Monday.  Call for questions.  #[phone]

## 2015-06-30 NOTE — Care Management Note (Signed)
Case Management Note  Patient Details  Name: Karen Bowman MRN: [id number] Date of Birth: March 09, 1941  Subjective/Objective:                    Action/Plan: Rec is for CIR. CM continuing to follow for d/c needs.   Expected Discharge Date:                  Expected Discharge Plan:     In-House Referral:     Discharge planning Services     Post Acute Care Choice:    Choice offered to:     DME Arranged:    DME Agency:     HH Arranged:    HH Agency:     Status of Service:  In process, will continue to follow  Medicare Important Message Given:  Yes Date Medicare IM Given:    Medicare IM give by:    Date Additional Medicare IM Given:    Additional Medicare Important Message give by:     If discussed at [place] of Stay Meetings, dates discussed:    Additional Comments:  Pollie Friar, RN 06/30/2015, 3:22 PM

## 2015-06-30 NOTE — Progress Notes (Signed)
Occupational Therapy Treatment Patient Details Name: Karen Bowman MRN: [id number] DOB: [birth year] Today's Date: 06/30/2015    History of present illness Adm Rt sided weakness; MRI + Lt BG CVA PMHx-afib, HTN, CHF,    OT comments  Pt making gradual progress toward OT goals but continues to be very motivated to participate in therapy. Pt tolerated RUE  table slides and grip exercises today. Continues to require min assist for grooming activities sitting EOB with WB through RUE. D/c plan remains appropriate. Will continue to follow acutely.    Follow Up Recommendations  CIR;Supervision/Assistance - 24 hour    Equipment Recommendations  Other (comment) (TBD)    Recommendations for Other Services      Precautions / Restrictions Precautions Precautions: Fall Restrictions Weight Bearing Restrictions: No       Mobility Bed Mobility Overal bed mobility: Needs Assistance Bed Mobility: Supine to Sit;Sit to Supine     Supine to sit: Min guard (Increased time required. ) Sit to supine: Min assist (Assist for RLE back to bed)   General bed mobility comments: HOB flat with no use of bed rail. Increased time required.  Transfers                 General transfer comment: Not assessed at this time    Balance Overall balance assessment: Needs assistance Sitting-balance support: Feet supported Sitting balance-Leahy Scale: Fair                             ADL Overall ADL's : Needs assistance/impaired     Grooming: Minimal assistance;Sitting;Brushing hair;Oral care                                 General ADL Comments: Educated pt on incorporating R UE into functional activities. WB through RUE performed with seating grooming activities. Performed table slides x 10 and grip with washcloth x 10.       Vision                     Perception     Praxis      Cognition   Behavior During Therapy: Anxious (Tearful) Overall Cognitive  Status: Within Functional Limits for tasks assessed                       Extremity/Trunk Assessment               Exercises Other Exercises Other Exercises: Min assist provided for table slides with R UE x 10. Pt with active retraction, assist provided for protraction. Other Exercises: Min assist for composite finger flexion/ext x 10. Pt with active flexion, assist required for extension.   Shoulder Instructions       General Comments      Pertinent Vitals/ Pain       Pain Assessment: No/denies pain  Home Living                                          Prior Functioning/Environment              Frequency Min 2X/week     Progress Toward Goals  OT Goals(current goals can now be found in the care plan section)  Progress towards OT goals: Progressing toward  goals  Acute Rehab OT Goals Patient Stated Goal: get back to being independent. OT Goal Formulation: With patient  Plan Discharge plan remains appropriate    Co-evaluation                 End of Session     Activity Tolerance Patient tolerated treatment well   Patient Left in bed;with call bell/phone within reach;with bed alarm set   Nurse Communication Other (comment) (Reddness/edema on LUE--old IV site?)        Time: [id number] OT Time Calculation (min): 31 min  Charges: OT General Charges $OT Visit: 1 Procedure OT Treatments $Self Care/Home Management : 8-22 mins $Therapeutic Exercise: 8-22 mins  Binnie Kand M.S., OTR/L Pager: [phone]  06/30/2015, 2:39 PM

## 2015-07-01 MED ORDER — POLYETHYLENE GLYCOL 3350 17 G PO PACK
17.0000 g | PACK | Freq: Every day | ORAL | Status: DC
  Filled 2015-07-01 (×2): qty 1

## 2015-07-01 NOTE — Progress Notes (Signed)
[facility] Progress Note  Patient: Karen Bowman [id number]   PCP: [name], MD DOB: 01/16/1941   DOA: 06/27/2015   DOS: 07/01/2015   Date of Service: the patient was seen and examined on 07/01/2015  Subjective: No acute complaint at present no acute events overnight tolerating medications Nutrition: tolerating oral diet  Brief hospital course: Patient was admitted on 06/27/2015, with complaint of right sided weakness, was found to have left basal ganglia infarct. Patient was started on aspirin in addition of her blood thinner. Currently further plan is awaiting placement at [facility].  Assessment and Plan: 1. Cerebrovascular accident (CVA) due to occlusion of cerebral artery (HCC) Right basal ganglia infarct on CT scan. Moderate to severe bilateral proximal PCA stenosis  Echocardiogram ejection fraction 0000000, diastolic dysfunction  LDL 195 hemoglobin A1c 5.7  Neurology consult recommends to add aspirin to patient's Eliquis. PTOT recommends CIR, awaiting arrangements Patient is gradually improving regarding her weakness  2. Chronic A. Fib Chronic combined CHF Essential hypertension. Continue amlodipine, continue beta blocker. Rate currently controlled. Continue Eliquis. Continue amiodarone  Activity: physical therapy [facility] Bowel regimen: last BM 06/28/2015, add MiraLAX  DVT Prophylaxis: on therapeutic anticoagulation. Nutrition: Cardiac diet Advance goals of care discussion: Full code  HPI: As per the H and P dictated on admission, "Karen Bowman is a 75 y.o. female with history of atrial fibrillation, hypertension, systolic heart failure presents to the ER because of right-sided weakness. Patient started experiencing right-sided upper extremity weakness last evening around 4 PM. The weakness persisted and patient later called EMS and patient was brought to the ER. EMS also noticed that patient had right lower extremity weakness in addition to the right upper extremity.  In the ER patient was counseled by the neurologist and CD8 was unremarkable. Patient is on Apixaban and is not a candidate for TPA. On exam patient has right-sided facial droop and some difficulty expressing words and right upper and lower extremity weakness. Denies any blurred vision or difficulty swallowing." Procedures: Echocardiogram Consultants: Neurology Antibiotics: Anti-infectives    None      Family Communication: no family was present at bedside, at the time of interview.   Disposition:  Expected discharge date:07/03/2015 Barriers to safe discharge: Recommendation from [facility]  No intake or output data in the 24 hours ending 07/01/15 1738 Filed Weights   06/27/15 2216  Weight: 57.153 kg (126 lb)    Objective: Physical Exam: Filed Vitals:   07/01/15 0525 07/01/15 1025 07/01/15 1423 07/01/15 1732  BP: 133/85 141/90 151/87 147/95  Pulse: 70 89 76 72  Temp: 98 F (36.7 C) 97.6 F (36.4 C) 97.8 F (36.6 C) 98.1 F (36.7 C)  TempSrc: Oral Oral Oral Oral  Resp: 18 16 15 16   Height:      Weight:      SpO2: 98% 97% 98% 98%    General: Appear in mild distress, no Rash; Oral Mucosa moist. Cardiovascular: S1 and S2 Present, no Murmur Respiratory: Bilateral Air entry present and Clear to Auscultation, no Crackles, no wheezes Abdomen: Bowel Sound present Extremities: no Pedal edema, no calf tenderness Neurology:  stable Dysarthria, right facial droop, right-sided weakness  Data Reviewed: CBC:  Recent Labs Lab 06/27/15 2201 06/27/15 2208 06/28/15 0200 06/28/15 0300  WBC 5.9  --  5.5 5.5  NEUTROABS 4.5  --   --   --   HGB 14.0 16.0* 13.4 13.4  HCT 42.6 47.0* 40.4 40.8  MCV 96.2  --  95.5 95.3  PLT 182  --  182 [id number]   Basic Metabolic Panel:  Recent Labs Lab 06/27/15 2201 06/27/15 2208 06/28/15 0200 06/28/15 0300  NA 139 138  --  138  K 3.7 3.6  --  3.7  CL 104 101  --  104  CO2 23  --   --  23  GLUCOSE 125* 118*  --  118*  BUN 14 19  --  10  CREATININE  0.78 0.70 0.68 0.66  CALCIUM 9.4  --   --  9.2   Liver Function Tests:  Recent Labs Lab 06/27/15 2201 06/28/15 0300  AST 30 31  ALT 22 21  ALKPHOS 65 62  BILITOT 0.5 0.8  PROT 6.5 6.6  ALBUMIN 3.9 3.8   No results for input(s): LIPASE, AMYLASE in the last 168 hours. No results for input(s): AMMONIA in the last 168 hours.  Cardiac Enzymes: No results for input(s): CKTOTAL, CKMB, CKMBINDEX, TROPONINI in the last 168 hours.  BNP (last 3 results)  Recent Labs  10/14/14 1751  BNP 1018.0*    CBG:  Recent Labs Lab 06/27/15 2216  GLUCAP 103*    No results found for this or any previous visit (from the past 240 hour(s)).   Studies: No results found.   Scheduled Meds: . amiodarone  100 mg Oral Daily  . amLODipine  2.5 mg Oral Daily  . apixaban  5 mg Oral BID  . aspirin EC  81 mg Oral Daily  . atorvastatin  40 mg Oral q1800  . metoprolol tartrate  12.5 mg Oral BID   Continuous Infusions:  PRN Meds:   Time spent: 30 minutes  Author: Berle Mull, MD [facility] Pager: [phone] 07/01/2015 5:38 PM  If 7PM-7AM, please contact night-coverage at [email or web address], password [facility]

## 2015-07-02 NOTE — Progress Notes (Signed)
[facility] PROGRESS NOTE  Patient: Karen Bowman [id number]   PCP: [name], MD DOB: [birth year]   DOA: 06/27/2015   DOS: 07/02/2015    Subjective: Patient continues to deny any acute complaint.  Objective:  Filed Vitals:   07/02/15 0526 07/02/15 1044  BP: 152/87 129/80  Pulse: 75 66  Temp: 97.5 F (36.4 C) 97.6 F (36.4 C)  Resp: 16 16   Physical Exam: Filed Vitals:   07/01/15 2201 07/02/15 0155 07/02/15 0526 07/02/15 1044  BP: 130/88 159/93 152/87 129/80  Pulse: 76 71 75 66  Temp: 98.1 F (36.7 C) 97.8 F (36.6 C) 97.5 F (36.4 C) 97.6 F (36.4 C)  TempSrc: Oral Oral Oral Oral  Resp: 16 16 16 16   Height:      Weight:      SpO2: 99% 100% 98% 98%   General: Alert, Awake and Oriented to Time, Place and Person. Appear in mild distress Eyes: PERRL ENT: Oral Mucosa clear moist. Cardiovascular: S1 and S2 Present, no Murmur, Neurologic: Persistent weakness and right-sided  Assessment and plan: Principal Problem:   Cerebrovascular accident (CVA) due to occlusion of cerebral artery (HCC) Active Problems:   HYPERLIPIDEMIA   HYPERTENSION, BENIGN ESSENTIAL   A-fib (HCC)   Chronic systolic heart failure (HCC)   Stroke (cerebrum) (HCC)  Continue current medication. Awaiting input from CIR as well as insurance. Patient is requesting some medication to be verified in the hospital that she takes at home and a regular basis. Requesting patient to notify the nurse once her medications are here.  Author: Berle Mull, MD [facility] Pager: [phone] 07/02/2015 1:34 PM   If 7PM-7AM, please contact night-coverage at [email or web address], password [facility]

## 2015-07-02 NOTE — Progress Notes (Signed)
Per MD, Pt likely will d/c to CIR.  Bernita Raisin, [place] Social Work [phone]

## 2015-07-03 ENCOUNTER — Inpatient Hospital Stay (HOSPITAL_COMMUNITY)
Admission: RE | Admit: 2015-07-03 | Discharge: 2015-07-15 | DRG: 057 | Disposition: A | Discharge status: Home Health | Payer: Medicare PPO | Source: Intra-hospital | Attending: Physical Medicine & Rehabilitation | Admitting: Physical Medicine & Rehabilitation

## 2015-07-03 DIAGNOSIS — I69959 Hemiplegia and hemiparesis following unspecified cerebrovascular disease affecting unspecified side: Secondary | ICD-10-CM

## 2015-07-03 DIAGNOSIS — Z888 Allergy status to other drugs, medicaments and biological substances status: Secondary | ICD-10-CM | POA: Diagnosis not present

## 2015-07-03 DIAGNOSIS — I110 Hypertensive heart disease with heart failure: Secondary | ICD-10-CM | POA: Diagnosis present

## 2015-07-03 DIAGNOSIS — M6249 Contracture of muscle, multiple sites: Secondary | ICD-10-CM | POA: Diagnosis not present

## 2015-07-03 DIAGNOSIS — I480 Paroxysmal atrial fibrillation: Secondary | ICD-10-CM

## 2015-07-03 DIAGNOSIS — M791 Myalgia, unspecified site: Secondary | ICD-10-CM | POA: Insufficient documentation

## 2015-07-03 DIAGNOSIS — E785 Hyperlipidemia, unspecified: Secondary | ICD-10-CM | POA: Diagnosis not present

## 2015-07-03 DIAGNOSIS — I509 Heart failure, unspecified: Secondary | ICD-10-CM

## 2015-07-03 DIAGNOSIS — R269 Unspecified abnormalities of gait and mobility: Secondary | ICD-10-CM | POA: Diagnosis not present

## 2015-07-03 DIAGNOSIS — R29810 Facial weakness: Secondary | ICD-10-CM | POA: Diagnosis present

## 2015-07-03 DIAGNOSIS — I5022 Chronic systolic (congestive) heart failure: Secondary | ICD-10-CM | POA: Diagnosis not present

## 2015-07-03 DIAGNOSIS — Z882 Allergy status to sulfonamides status: Secondary | ICD-10-CM

## 2015-07-03 DIAGNOSIS — Z7901 Long term (current) use of anticoagulants: Secondary | ICD-10-CM

## 2015-07-03 DIAGNOSIS — Z881 Allergy status to other antibiotic agents status: Secondary | ICD-10-CM

## 2015-07-03 DIAGNOSIS — Z79899 Other long term (current) drug therapy: Secondary | ICD-10-CM

## 2015-07-03 DIAGNOSIS — K589 Irritable bowel syndrome without diarrhea: Secondary | ICD-10-CM | POA: Diagnosis present

## 2015-07-03 DIAGNOSIS — I69398 Other sequelae of cerebral infarction: Secondary | ICD-10-CM | POA: Diagnosis not present

## 2015-07-03 DIAGNOSIS — M62838 Other muscle spasm: Secondary | ICD-10-CM | POA: Insufficient documentation

## 2015-07-03 DIAGNOSIS — H5340 Unspecified visual field defects: Secondary | ICD-10-CM | POA: Diagnosis present

## 2015-07-03 DIAGNOSIS — I10 Essential (primary) hypertension: Secondary | ICD-10-CM | POA: Diagnosis not present

## 2015-07-03 DIAGNOSIS — I639 Cerebral infarction, unspecified: Secondary | ICD-10-CM | POA: Diagnosis not present

## 2015-07-03 DIAGNOSIS — I6339 Cerebral infarction due to thrombosis of other cerebral artery: Secondary | ICD-10-CM | POA: Diagnosis not present

## 2015-07-03 DIAGNOSIS — I69351 Hemiplegia and hemiparesis following cerebral infarction affecting right dominant side: Secondary | ICD-10-CM | POA: Diagnosis not present

## 2015-07-03 DIAGNOSIS — I6350 Cerebral infarction due to unspecified occlusion or stenosis of unspecified cerebral artery: Secondary | ICD-10-CM

## 2015-07-03 DIAGNOSIS — Z90710 Acquired absence of both cervix and uterus: Secondary | ICD-10-CM | POA: Diagnosis not present

## 2015-07-03 DIAGNOSIS — I69993 Ataxia following unspecified cerebrovascular disease: Secondary | ICD-10-CM

## 2015-07-03 DIAGNOSIS — I69359 Hemiplegia and hemiparesis following cerebral infarction affecting unspecified side: Secondary | ICD-10-CM | POA: Diagnosis not present

## 2015-07-03 DIAGNOSIS — I4891 Unspecified atrial fibrillation: Secondary | ICD-10-CM

## 2015-07-03 LAB — CBC
HEMATOCRIT: 42.4 % (ref 36.0–46.0)
Hemoglobin: 14.2 g/dL (ref 12.0–15.0)
MCH: 32.2 pg (ref 26.0–34.0)
MCHC: 33.5 g/dL (ref 30.0–36.0)
MCV: 96.1 fL (ref 78.0–100.0)
Platelets: 192 10*3/uL (ref 150–400)
RBC: 4.41 MIL/uL (ref 3.87–5.11)
RDW: 12.7 % (ref 11.5–15.5)
WBC: 5.5 10*3/uL (ref 4.0–10.5)

## 2015-07-03 MED ORDER — SACCHAROMYCES BOULARDII 250 MG PO CAPS
250.0000 mg | ORAL_CAPSULE | Freq: Two times a day (BID) | ORAL | Status: DC
  Administered 2015-07-03 – 2015-07-15 (×24): 250 mg via ORAL
  Filled 2015-07-03 (×24): qty 1

## 2015-07-03 MED ORDER — LORATADINE 10 MG PO TABS
10.0000 mg | ORAL_TABLET | Freq: Every day | ORAL | Status: DC
  Administered 2015-07-04 – 2015-07-15 (×12): 10 mg via ORAL
  Filled 2015-07-03 (×12): qty 1

## 2015-07-03 MED ORDER — METOPROLOL TARTRATE 12.5 MG HALF TABLET
12.5000 mg | ORAL_TABLET | Freq: Two times a day (BID) | ORAL | Status: DC
  Administered 2015-07-03 – 2015-07-15 (×24): 12.5 mg via ORAL
  Filled 2015-07-03 (×24): qty 1

## 2015-07-03 MED ORDER — AMLODIPINE BESYLATE 2.5 MG PO TABS
2.5000 mg | ORAL_TABLET | Freq: Every day | ORAL | Status: DC
  Administered 2015-07-04 – 2015-07-06 (×3): 2.5 mg via ORAL
  Filled 2015-07-03 (×3): qty 1

## 2015-07-03 MED ORDER — ASPIRIN EC 81 MG PO TBEC
81.0000 mg | DELAYED_RELEASE_TABLET | Freq: Every day | ORAL | Status: DC
  Administered 2015-07-04 – 2015-07-15 (×12): 81 mg via ORAL
  Filled 2015-07-03 (×12): qty 1

## 2015-07-03 MED ORDER — ACETAMINOPHEN 325 MG PO TABS
325.0000 mg | ORAL_TABLET | ORAL | Status: DC | PRN
  Administered 2015-07-04 – 2015-07-15 (×9): 650 mg via ORAL
  Filled 2015-07-03 (×9): qty 2

## 2015-07-03 MED ORDER — ADULT MULTIVITAMIN W/MINERALS CH
1.0000 | ORAL_TABLET | Freq: Every day | ORAL | Status: DC
  Administered 2015-07-04 – 2015-07-15 (×12): 1 via ORAL
  Filled 2015-07-03 (×12): qty 1

## 2015-07-03 MED ORDER — ATORVASTATIN CALCIUM 40 MG PO TABS
40.0000 mg | ORAL_TABLET | Freq: Every day | ORAL | Status: DC
  Administered 2015-07-03 – 2015-07-14 (×12): 40 mg via ORAL
  Filled 2015-07-03 (×12): qty 1

## 2015-07-03 MED ORDER — ATORVASTATIN CALCIUM 40 MG PO TABS: 40.0000 mg | ORAL_TABLET | Freq: Every day | ORAL | Status: DC

## 2015-07-03 MED ORDER — ONDANSETRON HCL 4 MG PO TABS: 4.0000 mg | ORAL_TABLET | Freq: Four times a day (QID) | ORAL | Status: DC | PRN

## 2015-07-03 MED ORDER — POLYETHYLENE GLYCOL 3350 17 G PO PACK: 17.0000 g | PACK | Freq: Every day | ORAL | Status: DC

## 2015-07-03 MED ORDER — AMIODARONE HCL 100 MG PO TABS
100.0000 mg | ORAL_TABLET | Freq: Every day | ORAL | Status: DC
Start: 2015-07-04 — End: 2015-07-15
  Administered 2015-07-04 – 2015-07-15 (×12): 100 mg via ORAL
  Filled 2015-07-03 (×12): qty 1

## 2015-07-03 MED ORDER — APIXABAN 5 MG PO TABS
5.0000 mg | ORAL_TABLET | Freq: Two times a day (BID) | ORAL | Status: DC
  Administered 2015-07-03 – 2015-07-15 (×24): 5 mg via ORAL
  Filled 2015-07-03 (×24): qty 1

## 2015-07-03 MED ORDER — ONDANSETRON HCL 4 MG/2ML IJ SOLN: 4.0000 mg | Freq: Four times a day (QID) | INTRAMUSCULAR | Status: DC | PRN

## 2015-07-03 MED ORDER — POLYETHYLENE GLYCOL 3350 17 G PO PACK: 17.0000 g | PACK | Freq: Every day | ORAL | Status: DC | PRN

## 2015-07-03 MED ORDER — ASPIRIN 81 MG PO TBEC: 81.0000 mg | DELAYED_RELEASE_TABLET | Freq: Every day | ORAL | Status: DC

## 2015-07-03 NOTE — Care Management Note (Signed)
Case Management Note  Patient Details  Name: Karen Bowman MRN: [id number] Date of Birth: Aug 23, 1940  Subjective/Objective:                    Action/Plan: Patient discharging to CIR today. No further needs per CM.   Expected Discharge Date:                  Expected Discharge Plan:  [place]  In-House Referral:     Discharge planning Services     Post Acute Care Choice:    Choice offered to:     DME Arranged:    DME Agency:     HH Arranged:    [place] Agency:     Status of Service:  Completed, signed off  Medicare Important Message Given:  Yes Date Medicare IM Given:    Medicare IM give by:    Date Additional Medicare IM Given:    Additional Medicare Important Message give by:     If discussed at [place] of Stay Meetings, dates discussed:    Additional Comments:  Pollie Friar, RN 07/03/2015, 11:20 AM

## 2015-07-03 NOTE — Discharge Summary (Signed)
[facility] Discharge Summary   Patient: Karen Bowman [id number]   PCP: [name], MD DOB: January 29, 1941   Date of admission: 06/27/2015   Date of discharge:  07/03/2015    Discharge Diagnoses:  Principal Problem:   Cerebrovascular accident (CVA) due to occlusion of cerebral artery ([place]) Active Problems:   HYPERLIPIDEMIA   HYPERTENSION, BENIGN ESSENTIAL   A-fib ([place])   Chronic systolic heart failure ([place])   Stroke (cerebrum) ([facility])  Recommendations for Outpatient Follow-up:  1. Please follow-up with PCP in one week. 2. Please follow-up with neurology as scheduled. 3. Please get speech therapy evaluation at the [facility]  Follow-up Information    Follow up with Xu,Jindong, MD. Schedule an appointment as soon as possible for a visit in 2 months.   Specialty:  Neurology   Why:  stroke clinic   Contact information:   [address] [place] [address] [phone]       Follow up with [name], MD. Schedule an appointment as soon as possible for a visit in 1 week.   Specialty:  Family Medicine   Contact information:   [place] [id number] [phone]      Diet recommendation: Heart healthy diet  Activity: The patient is advised to gradually reintroduce usual activities.  Discharge Condition: good  History of present illness: As per the H and P dictated on admission, "Karen Bowman is a 75 y.o. female with history of atrial fibrillation, hypertension, systolic heart failure presents to the ER because of right-sided weakness. Patient started experiencing right-sided upper extremity weakness last evening around 4 PM. The weakness persisted and patient later called EMS and patient was brought to the ER. EMS also noticed that patient had right lower extremity weakness in addition to the right upper extremity. In the ER patient was counseled by the neurologist and CD8 was unremarkable. Patient is on Apixaban and is not a candidate  for TPA. On exam patient has right-sided facial droop and some difficulty expressing words and right upper and lower extremity weakness. Denies any blurred vision or difficulty swallowing."  Hospital Course:  Summary of her active problems in the hospital is as following. 1. Cerebrovascular accident (CVA) due to occlusion of cerebral artery ([place]) Right basal ganglia infarct on CT scan. Stroke swallowing test was successfully passed by the patient. Moderate to severe bilateral proximal PCA stenosis no further workup recommended from neurology Echocardiogram ejection fraction 0000000, diastolic dysfunction  LDL 195 hemoglobin A1c 5.7 Lipitor 40 mg added  Neurology consult recommends to add aspirin to patient's Eliquis. PTOT recommends CIR. speech evaluation for language will be performed at the CIR. Patient is gradually improving regarding her weakness  2. Chronic A. Fib Chronic combined CHF Essential hypertension. Continue amlodipine, continue beta blocker. Rate currently controlled. Continue Eliquis. Continue amiodarone  3. Constipation. Continue MiraLAX  4. Over-the-counter medications. The patient is taking multiple over-the-counter medication which she would like to continue on discharge. Recommended patient to an sure safety and efficacy as well as avoid interaction. Recommended her to always make sure that she discuss all her medication with the pharmacy due to her being on oral anticoagulation as well as aspirin  All other chronic medical condition were stable during the hospitalization.  Patient was seen by physical therapy, who recommended CIR, which was arranged by Education officer, museum and case Freight forwarder. On the day of the discharge the patient's vitals were stable, and no other acute medical condition were reported by patient.  the patient was felt safe to be discharge at [facility] with therapy.  Procedures and Results:   Echocardiogram Study Conclusions  - Left ventricle: The  cavity size was normal. Wall thickness was  increased in a pattern of mild LVH. Systolic function was normal.  The estimated ejection fraction was in the range of 55% to 60%.  Wall motion was normal; there were no regional wall motion  abnormalities. - Mitral valve: Moderately calcified annulus. Moderately thickened,  moderately calcified leaflets . - Atrial septum: No defect or patent foramen ovale was identified.  Consultations:  Neurology  DISCHARGE MEDICATION: Current Discharge Medication List    START taking these medications   Details  aspirin EC 81 MG EC tablet Take 1 tablet (81 mg total) by mouth daily. Qty: 30 tablet, Refills: 0    atorvastatin (LIPITOR) 40 MG tablet Take 1 tablet (40 mg total) by mouth daily at 6 PM. Qty: 30 tablet, Refills: 0    polyethylene glycol (MIRALAX / GLYCOLAX) packet Take 17 g by mouth daily. Qty: 14 each, Refills: 0      CONTINUE these medications which have NOT CHANGED   Details  amiodarone (PACERONE) 200 MG tablet Take 1 tablet (200 mg total) by mouth daily. Qty: 90 tablet, Refills: 3    amLODipine (NORVASC) 2.5 MG tablet Take 1 tablet (2.5 mg total) by mouth daily. Qty: 90 tablet, Refills: 3    apixaban (ELIQUIS) 5 MG TABS tablet Take 1 tablet (5 mg total) by mouth 2 (two) times daily. Qty: 180 tablet, Refills: 3    BEE POLLEN PO Take 2 tablets by mouth daily.     Cholecalciferol 1000 UNITS capsule Take 2,000 Units by mouth daily.     CINNAMON PO Take 2 tablets by mouth daily.     Coenzyme Q10-Fish Oil-Vit E (CO-Q 10 OMEGA-3 FISH OIL) CAPS Take 1 capsule by mouth 2 (two) times daily.    CRANBERRY PO Take 1 tablet by mouth 2 (two) times daily.     DANDELION PO Take 1 tablet by mouth 2 (two) times daily.    EYEBRIGHT PO Take 1 tablet by mouth daily.    Hyaluronic Acid-Vitamin C (HYALURONIC ACID PO) Take 2 tablets by mouth daily.    L-GLUTAMINE PO Take 1 tablet by mouth 2 (two) times daily.     metoprolol tartrate  (LOPRESSOR) 25 MG tablet Take 0.5 tablets (12.5 mg total) by mouth 2 (two) times daily. Qty: 90 tablet, Refills: 3    MILK THISTLE PO Take 1 tablet by mouth daily.     MISC NATURAL PRODUCTS PO Take 1 tablet by mouth daily. Advanced BP Support-Dr.Sinatra ([place]'s #1 integrated cardiologist)    MISC NATURAL PRODUCTS PO Take 1 tablet by mouth daily. Multivitamin-Dr. Talmage Coin    MISC NATURAL PRODUCTS PO Take 4 tablets by mouth daily. [place]    MISC NATURAL PRODUCTS PO Take 1 tablet by mouth daily. Ashwagandha    MISC NATURAL PRODUCTS PO Take 5 tablets by mouth daily. Bone-Up by Gus Height    MISC NATURAL PRODUCTS PO Advanced Glucosamine Chondroitin-MSM Natures Plus Ultra Rx Joint Liquid 1/2 capful per day    MISC NATURAL PRODUCTS PO Take 2 tablets by mouth daily. Curcumin    OVER THE COUNTER MEDICATION Take 2 tablets by mouth daily. Omega Q Plus w/resveratrol-Dr. Talmage Coin    Probiotic CAPS Take 2 capsules by mouth daily.    QUERCETIN PO Take 1-2 tablets by mouth daily. w/bromelain    ROYAL JELLY PO Take  1 tablet by mouth daily.    RUTIN PO Take 1 capsule by mouth 2 (two) times daily.    betamethasone dipropionate (DIPROLENE) 0.05 % cream Apply topically 2 (two) times daily. Qty: 30 g, Refills: 3    estradiol (ESTRACE) 0.1 MG/GM vaginal cream Place 1 Applicatorful vaginally once a week. Qty: 42.5 g, Refills: 12   Associated Diagnoses: Atrophic vaginitis; Urethral caruncle       Allergies  Allergen Reactions  . Ciprofloxacin     REACTION: dizzy  . Ciprofloxacin Nausea And Vomiting  . Hydrochlorothiazide Nausea And Vomiting  . Hydrochlorothiazide W-Triamterene     REACTION: nausea  . Sulfa Antibiotics Nausea And Vomiting  . Sulfonamide Derivatives     REACTION: nausea \\T \ vomiting  . Valsartan     REACTION: angioedema - probable  . Valsartan Nausea And Vomiting   Discharge Instructions    Ambulatory referral to Neurology    Complete by:  As directed   Pt  will follow up with Dr. [name] at [facility] in about 2 months. Thanks.     Diet - low sodium heart healthy    Complete by:  As directed      Discharge instructions    Complete by:  As directed   It is important that you read following instructions as well as go over your medication list with RN to help you understand your care after this hospitalization.  Discharge Instructions: Please follow-up with PCP in one week Please follow up with neurology in 2 months.  Please request your primary care physician to go over all Hospital Tests and Procedure/Radiological results at the follow up,  Please get all Hospital records sent to your PCP by signing hospital release before you go home.   You were cared for by a hospitalist during your hospital stay. If you have any questions about your discharge medications or the care you received while you were in the hospital after you are discharged, you can call the unit and ask to speak with the hospitalist on call if the hospitalist that took care of you is not available.  Once you are discharged, your primary care physician will handle any further medical issues. Please note that NO REFILLS for any discharge medications will be authorized once you are discharged, as it is imperative that you return to your primary care physician (or establish a relationship with a primary care physician if you do not have one) for your aftercare needs so that they can reassess your need for medications and monitor your lab values. You Must read complete instructions/literature along with all the possible adverse reactions/side effects for all the Medicines you take and that have been prescribed to you. Take any new Medicines after you have completely understood and accept all the possible adverse reactions/side effects. Wear Seat belts while driving.     Increase activity slowly    Complete by:  As directed           Discharge Exam: Filed Weights   06/27/15 2216  Weight: 57.153  kg (126 lb)   Filed Vitals:   07/03/15 0624 07/03/15 0903  BP: 152/90 163/77  Pulse: 65 100  Temp: 97.9 F (36.6 C) 97.7 F (36.5 C)  Resp: 16 20   General: Appear in mild distress, no Rash; Oral Mucosa moist. Cardiovascular: S1 and S2 Present, no Murmur, no JVD Respiratory: Bilateral Air entry present and Clear to Auscultation, no Crackles, no wheezes Abdomen: Bowel Sound present, Soft and no tenderness Extremities:  no Pedal edema, no calf tenderness Neurology: Improving right-sided weakness, improving dysarthria  The results of significant diagnostics from this hospitalization (including imaging, microbiology, ancillary and laboratory) are listed below for reference.    Significant Diagnostic Studies: Ct Angio Head W/cm &/or Wo Cm  06/28/2015  CLINICAL DATA:  Right leg weakness and difficulty walking. Posterior headaches. Acute left basal ganglia infarct on MRI. EXAM: CT ANGIOGRAPHY HEAD AND NECK TECHNIQUE: Multidetector CT imaging of the head and neck was performed using the standard protocol during bolus administration of intravenous contrast. Multiplanar CT image reconstructions and MIPs were obtained to evaluate the vascular anatomy. Carotid stenosis measurements (when applicable) are obtained utilizing NASCET criteria, using the distal internal carotid diameter as the denominator. CONTRAST:  52mL OMNIPAQUE IOHEXOL 350 MG/ML SOLN COMPARISON:  Head MRI/ MRA earlier today FINDINGS: CTA NECK Aortic arch: Normal variant aortic arch branching pattern with common origin of the brachiocephalic and left common carotid arteries. Noncalcified plaque in the proximal left subclavian artery results in less than 50% stenosis. No brachiocephalic or right subclavian artery stenosis. Right carotid system: Patent with mild non stenotic plaque at the carotid bifurcation. Tortuous distal cervical ICA. Left carotid system: Patent with mild non stenotic plaque at the common carotid artery origin and at the  carotid bifurcation. Vertebral arteries: The vertebral arteries are patent with the right being dominant. No vertebral artery stenosis is identified. Left V1 assessment is slightly limited by surrounding dense venous contrast. Skeleton: Mild cervical spondylosis. Other neck: Mild enlargement of the right greater than left thyroid lobes with multiple small predominantly hypoattenuating nodules suggestive of multinodular goiter. CTA HEAD Anterior circulation: The internal carotid arteries are patent from skullbase to carotid termini. There is mild carotid siphon atherosclerosis bilaterally, and there is mild narrowing of the anterior cavernous and proximal supraclinoid segments on the right. A small infundibulum is noted at the right posterior communicating artery origin. There is also a 2 mm outpouching in the region of the left posterior communicating artery origin also favored to reflect a small infundibulum. The right A1 segment is absent. The left A1 segment is widely patent and supplies the right ACA. MCAs are patent without evidence of significant proximal stenosis or major branch vessel occlusion. Mild bilateral MCA branch vessel irregularity suggests atherosclerosis. Posterior circulation: The intracranial vertebral arteries are patent with the right being dominant. The left vertebral artery functionally ends in PICA. PICA and SCA origins are patent. Basilar artery is patent without stenosis. There is a fetal type origin of the right PCA with hypoplastic P1 segment. There is mild narrowing of the mid to distal right posterior communicating artery, and there is severe stenosis of the proximal P2 segment. There is also a focal, moderate to severe left PCA stenosis involving the distal P1 segment. Venous sinuses: Patent. Anatomic variants: Absent right A1. Delayed phase: Mild edema is noted in corresponding to the acute left basal ganglia infarct, most conspicuous in the posterior putamen. There is no evidence  of acute intracranial hemorrhage or abnormal enhancement. IMPRESSION: 1. Moderate to severe bilateral proximal PCA stenoses. 2. Mild right cavernous and supraclinoid ICA stenosis. 3. No cervical carotid or vertebral artery stenosis. Electronically Signed   By: Logan Bores M.D.   On: 06/28/2015 12:03   Dg Chest 2 View  06/28/2015  CLINICAL DATA:  Recent stroke and weakness EXAM: CHEST  2 VIEW COMPARISON:  10/18/2014 FINDINGS: Cardiac shadow is enlarged. Aortic calcifications are again noted. The previously seen effusions have resolved in the interval. No  focal infiltrate or sizable effusion is seen. No bony abnormality seen. IMPRESSION: No acute abnormality noted. Electronically Signed   By: Inez Catalina M.D.   On: 06/28/2015 15:34   Ct Head Wo Contrast  06/27/2015  ADDENDUM REPORT: 06/27/2015 22:28 ADDENDUM: These results were called by telephone at the time of interpretation on 06/27/2015 at 10:28 pm to Dr. Leonel Ramsay, who verbally acknowledged these results. Electronically Signed   By: Anner Crete M.D.   On: 06/27/2015 22:28  06/27/2015  CLINICAL DATA:  75 year old female with right-sided weakness and slurred speech. Code stroke. EXAM: CT HEAD WITHOUT CONTRAST TECHNIQUE: Contiguous axial images were obtained from the base of the skull through the vertex without intravenous contrast. COMPARISON:  None. FINDINGS: There is mild prominence of the ventricles and sulci compatible with age-related atrophy. Minimal periventricular and deep white matter chronic microvascular ischemic changes noted. There is no acute intracranial hemorrhage. No mass effect or midline shift. A 5 mm focal hyperdense focus along the M1 segment of the left MCA (series 201, image 13) may be artifactual. A left MCA thrombus is not excluded. Correlation with clinical exam recommended. CT angiography may provide additional evaluation. The visualized paranasal sinuses and the mastoid air cells are clear. The calvarium is intact.  IMPRESSION: No acute intracranial hemorrhage. Artifact versus less likely a small focal left MCA thrombus. Clinical correlation recommended. CT angiography may provide better evaluation. Electronically Signed: By: Anner Crete M.D. On: 06/27/2015 22:23   Ct Angio Neck W/cm &/or Wo/cm  06/28/2015  CLINICAL DATA:  Right leg weakness and difficulty walking. Posterior headaches. Acute left basal ganglia infarct on MRI. EXAM: CT ANGIOGRAPHY HEAD AND NECK TECHNIQUE: Multidetector CT imaging of the head and neck was performed using the standard protocol during bolus administration of intravenous contrast. Multiplanar CT image reconstructions and MIPs were obtained to evaluate the vascular anatomy. Carotid stenosis measurements (when applicable) are obtained utilizing NASCET criteria, using the distal internal carotid diameter as the denominator. CONTRAST:  58mL OMNIPAQUE IOHEXOL 350 MG/ML SOLN COMPARISON:  Head MRI/ MRA earlier today FINDINGS: CTA NECK Aortic arch: Normal variant aortic arch branching pattern with common origin of the brachiocephalic and left common carotid arteries. Noncalcified plaque in the proximal left subclavian artery results in less than 50% stenosis. No brachiocephalic or right subclavian artery stenosis. Right carotid system: Patent with mild non stenotic plaque at the carotid bifurcation. Tortuous distal cervical ICA. Left carotid system: Patent with mild non stenotic plaque at the common carotid artery origin and at the carotid bifurcation. Vertebral arteries: The vertebral arteries are patent with the right being dominant. No vertebral artery stenosis is identified. Left V1 assessment is slightly limited by surrounding dense venous contrast. Skeleton: Mild cervical spondylosis. Other neck: Mild enlargement of the right greater than left thyroid lobes with multiple small predominantly hypoattenuating nodules suggestive of multinodular goiter. CTA HEAD Anterior circulation: The internal  carotid arteries are patent from skullbase to carotid termini. There is mild carotid siphon atherosclerosis bilaterally, and there is mild narrowing of the anterior cavernous and proximal supraclinoid segments on the right. A small infundibulum is noted at the right posterior communicating artery origin. There is also a 2 mm outpouching in the region of the left posterior communicating artery origin also favored to reflect a small infundibulum. The right A1 segment is absent. The left A1 segment is widely patent and supplies the right ACA. MCAs are patent without evidence of significant proximal stenosis or major branch vessel occlusion. Mild bilateral MCA branch vessel  irregularity suggests atherosclerosis. Posterior circulation: The intracranial vertebral arteries are patent with the right being dominant. The left vertebral artery functionally ends in PICA. PICA and SCA origins are patent. Basilar artery is patent without stenosis. There is a fetal type origin of the right PCA with hypoplastic P1 segment. There is mild narrowing of the mid to distal right posterior communicating artery, and there is severe stenosis of the proximal P2 segment. There is also a focal, moderate to severe left PCA stenosis involving the distal P1 segment. Venous sinuses: Patent. Anatomic variants: Absent right A1. Delayed phase: Mild edema is noted in corresponding to the acute left basal ganglia infarct, most conspicuous in the posterior putamen. There is no evidence of acute intracranial hemorrhage or abnormal enhancement. IMPRESSION: 1. Moderate to severe bilateral proximal PCA stenoses. 2. Mild right cavernous and supraclinoid ICA stenosis. 3. No cervical carotid or vertebral artery stenosis. Electronically Signed   By: Logan Bores M.D.   On: 06/28/2015 12:03   Mr Brain Wo Contrast  06/28/2015  CLINICAL DATA:  Right-sided weakness. EXAM: MRI HEAD WITHOUT CONTRAST MRA HEAD WITHOUT CONTRAST TECHNIQUE: Multiplanar, multiecho pulse  sequences of the brain and surrounding structures were obtained without intravenous contrast. Angiographic images of the head were obtained using MRA technique without contrast. COMPARISON:  Head CT 06/27/2015 FINDINGS: MRI HEAD FINDINGS There is an acute infarct involving the left lateral lenticulostriate territory extending from the posterior body of the caudate into the posterior putamen. There is no evidence of intracranial hemorrhage, mass, midline shift, or extra-axial fluid collection. Small foci of T2 hyperintensity scattered throughout the cerebral white matter bilaterally are nonspecific but compatible with mild chronic small vessel ischemic disease. There is mild generalized cerebral atrophy. Moderate chronic small vessel ischemic change is noted in the pons. Prior bilateral cataract extraction is noted. Tiny mucous retention cysts are noted in the maxillary sinuses. No significant mastoid fluid. Major intracranial vascular flow voids are preserved. MRA HEAD FINDINGS The study is mildly motion degraded. The visualized distal vertebral arteries are patent with the right being strongly dominant. The left vertebral artery ends in PICA. Right PICA origin is patent. SCA origins are patent. Basilar artery is patent without stenosis. There is a fetal type origin of the right PCA. There is evidence of a severe proximal right P2 stenosis, with assessment mildly limited by motion artifact through this area. There may be mild stenosis near the left P1 - P2 junction. Internal carotid arteries are patent from skullbase to carotid termini without evidence of significant stenosis. The right A1 segment is absent. The left A1 segment is widely patent and supplies the right ACA. MCAs are patent without evidence of significant proximal stenosis or major branch occlusion. No intracranial aneurysm is identified. IMPRESSION: 1. Acute left basal ganglia infarct. 2. Mild chronic small vessel ischemic disease and cerebral  atrophy. 3. No large vessel occlusion or significant proximal anterior circulation stenosis. 4. Severe right P2 PCA stenosis. Electronically Signed   By: Logan Bores M.D.   On: 06/28/2015 11:15   Mr Jodene Nam Head/brain Wo Cm  06/28/2015  CLINICAL DATA:  Right-sided weakness. EXAM: MRI HEAD WITHOUT CONTRAST MRA HEAD WITHOUT CONTRAST TECHNIQUE: Multiplanar, multiecho pulse sequences of the brain and surrounding structures were obtained without intravenous contrast. Angiographic images of the head were obtained using MRA technique without contrast. COMPARISON:  Head CT 06/27/2015 FINDINGS: MRI HEAD FINDINGS There is an acute infarct involving the left lateral lenticulostriate territory extending from the posterior body of the caudate into  the posterior putamen. There is no evidence of intracranial hemorrhage, mass, midline shift, or extra-axial fluid collection. Small foci of T2 hyperintensity scattered throughout the cerebral white matter bilaterally are nonspecific but compatible with mild chronic small vessel ischemic disease. There is mild generalized cerebral atrophy. Moderate chronic small vessel ischemic change is noted in the pons. Prior bilateral cataract extraction is noted. Tiny mucous retention cysts are noted in the maxillary sinuses. No significant mastoid fluid. Major intracranial vascular flow voids are preserved. MRA HEAD FINDINGS The study is mildly motion degraded. The visualized distal vertebral arteries are patent with the right being strongly dominant. The left vertebral artery ends in PICA. Right PICA origin is patent. SCA origins are patent. Basilar artery is patent without stenosis. There is a fetal type origin of the right PCA. There is evidence of a severe proximal right P2 stenosis, with assessment mildly limited by motion artifact through this area. There may be mild stenosis near the left P1 - P2 junction. Internal carotid arteries are patent from skullbase to carotid termini without  evidence of significant stenosis. The right A1 segment is absent. The left A1 segment is widely patent and supplies the right ACA. MCAs are patent without evidence of significant proximal stenosis or major branch occlusion. No intracranial aneurysm is identified. IMPRESSION: 1. Acute left basal ganglia infarct. 2. Mild chronic small vessel ischemic disease and cerebral atrophy. 3. No large vessel occlusion or significant proximal anterior circulation stenosis. 4. Severe right P2 PCA stenosis. Electronically Signed   By: Logan Bores M.D.   On: 06/28/2015 11:15    Microbiology: No results found for this or any previous visit (from the past 240 hour(s)).   Labs: CBC:  Recent Labs Lab 06/27/15 2201 06/27/15 2208 06/28/15 0200 06/28/15 0300 07/03/15 0224  WBC 5.9  --  5.5 5.5 5.5  NEUTROABS 4.5  --   --   --   --   HGB 14.0 16.0* 13.4 13.4 14.2  HCT 42.6 47.0* 40.4 40.8 42.4  MCV 96.2  --  95.5 95.3 96.1  PLT 182  --  182 191 [id number]   Basic Metabolic Panel:  Recent Labs Lab 06/27/15 2201 06/27/15 2208 06/28/15 0200 06/28/15 0300  NA 139 138  --  138  K 3.7 3.6  --  3.7  CL 104 101  --  104  CO2 23  --   --  23  GLUCOSE 125* 118*  --  118*  BUN 14 19  --  10  CREATININE 0.78 0.70 0.68 0.66  CALCIUM 9.4  --   --  9.2   Liver Function Tests:  Recent Labs Lab 06/27/15 2201 06/28/15 0300  AST 30 31  ALT 22 21  ALKPHOS 65 62  BILITOT 0.5 0.8  PROT 6.5 6.6  ALBUMIN 3.9 3.8   No results for input(s): LIPASE, AMYLASE in the last 168 hours. No results for input(s): AMMONIA in the last 168 hours. Cardiac Enzymes: No results for input(s): CKTOTAL, CKMB, CKMBINDEX, TROPONINI in the last 168 hours. BNP (last 3 results)  Recent Labs  10/14/14 1751  BNP 1018.0*   CBG:  Recent Labs Lab 06/27/15 2216  GLUCAP 103*   Time spent: 30 minutes  Signed:  PATEL, PRANAV  [facility]  07/03/2015  , 11:54 AM

## 2015-07-03 NOTE — Progress Notes (Signed)
Occupational Therapy Treatment Patient Details Name: TRENYCE KAMMERDIENER MRN: [id number] DOB: [birth year] Today's Date: 07/03/2015    History of present illness Admitted with Rt sided weakness; MRI + Lt basal ganglia CVA PMHx-afib, HTN, CHF,    OT comments  Pt progressing. Continue to recommend CIR.  Follow Up Recommendations  CIR;Supervision/Assistance - 24 hour    Equipment Recommendations  Other (comment) (TBD)    Recommendations for Other Services Rehab consult    Precautions / Restrictions Precautions Precautions: Fall Restrictions Weight Bearing Restrictions: No       Mobility Bed Mobility Overal bed mobility: Needs Assistance Bed Mobility: Supine to Sit     Supine to sit: Supervision        Transfers Overall transfer level: Needs assistance Equipment used: Rolling walker (2 wheeled) Transfers: Sit to/from [facility] Sit to Stand: Min guard Stand pivot transfers: Mod assist       General transfer comment: transferred from bed to [facility] without RW-Mod assist. Less assist for stand pivot from [facility] to chair and used RW for this transfer.     Balance      Assist with stand pivot transfer.                             ADL Overall ADL's : Needs assistance/impaired     Grooming: Brushing hair;Applying deodorant;Oral care;Minimal assistance (wiped off hands with washcloth) Grooming Details (indicate cue type and reason): assist applying deodorant                 Toilet Transfer: Moderate assistance;Stand-pivot;BSC;RW Toilet Transfer Details (indicate cue type and reason): Mod assist to transfer to [facility]; less assist needed when transferring from [facility] to chair. Toileting- Clothing Manipulation and Hygiene: Moderate assistance Toileting - Clothing Manipulation Details (indicate cue type and reason): OT managed gown for pt prior to urination and pt able to perform hygiene     Functional mobility during ADLs: Moderate assistance  (stand pivot; performed with and without RW) General ADL Comments: Educated on UB dressing technique. Encouraged pt to use RUE in session and she was able to use it some. Postitioned RUE on pillow at end of session.      Vision                     Perception     Praxis      Cognition  Awake/Alert Behavior During Therapy: WFL for tasks assessed/performed Overall Cognitive Status: Within Functional Limits for tasks assessed                       Extremity/Trunk Assessment               Exercises     Shoulder Instructions       General Comments      Pertinent Vitals/ Pain       Pain Assessment: No/denies pain  Home Living                                          Prior Functioning/Environment              Frequency Min 2X/week     Progress Toward Goals  OT Goals(current goals can now be found in the care plan section)  Progress towards OT goals: Progressing toward goals  Acute  Rehab OT Goals Patient Stated Goal: not stated OT Goal Formulation: With patient Time For Goal Achievement: 07/13/15 Potential to Achieve Goals: Good ADL Goals Pt Will Perform Grooming: with set-up;sitting Pt Will Perform Upper Body Bathing: with supervision;sitting Pt Will Perform Lower Body Bathing: sit to/from stand;with min assist Pt Will Transfer to Toilet: with mod assist;ambulating;bedside commode (over toilet) Pt Will Perform Toileting - Clothing Manipulation and hygiene: sit to/from stand;with min assist Pt/caregiver will Perform Home Exercise Program: Increased ROM;Increased strength;Right Upper extremity;Independently;With written HEP provided (increase fine/gross motor coordination)  Plan Discharge plan remains appropriate    Co-evaluation                 End of Session Equipment Utilized During Treatment: Gait belt;Rolling walker   Activity Tolerance Patient tolerated treatment well   Patient Left in chair;with chair  alarm set;with call bell/phone within reach   Nurse Communication          Time: [id number] OT Time Calculation (min): 22 min  Charges: OT General Charges $OT Visit: 1 Procedure OT Treatments $Self Care/Home Management : 8-22 mins  Benito Mccreedy OTR/L [id number] 07/03/2015, 5:17 PM

## 2015-07-03 NOTE — Progress Notes (Signed)
I have insurance approval and can admit pt to inpt rehab today. Pt is aware and in agreement. I have made Attending MD, Dr. Posey Pronto aware. I will make the arrangements and alert RN CM and SW. [phone]

## 2015-07-03 NOTE — H&P (View-Only) (Signed)
Physical Medicine and Rehabilitation Admission H&P    Chief Complaint  Patient presents with  . Right sided weakness, right facial weakness, slurred speech   HPI:  Karen Bowman is a 75 y.o. right handed female with history of HTN, chronic systolic congestive heart failure, A fib- on Eliquis; who was admitted on 06/28/2015 with right-sided weakness and slurred speech. MRI of the brain showed acute left basal ganglia infarct. MRA of the head was negative for large vessel occlusion or stenosis. Patient did not receive TPA. Echocardiogram with ejection fraction 60% no wall motion abnormalities. CTA angiogram of head and neck showed severe R-P2 PCA stenosis.  Dr. [name] felt that stroke likely due to small vessel disease and low dose ASA added for secondary stroke prevention.  She is tolerating a regular diet but continues to have mild dysarthria with right facial weakness and right sided weakness affecting ability to carry out ADL tasks and mobility. CIR recommended for follow up therapy and patient was admitted for a comprehensive rehabilitation program  Review of Systems  HENT: Negative for hearing loss.   Eyes: Negative for blurred vision, double vision and pain.  Respiratory: Positive for cough. Negative for shortness of breath.        Stuffy nose  Cardiovascular: Negative for chest pain and palpitations.  Gastrointestinal: Negative for heartburn, nausea and vomiting.       IBS managed by diet.   Genitourinary: Negative for urgency and frequency.  Musculoskeletal: Negative for myalgias, back pain and neck pain.  Neurological: Positive for speech change and focal weakness. Negative for dizziness and headaches.  Psychiatric/Behavioral: The patient is nervous/anxious and has insomnia (stuffy nose/PND).   All other systems reviewed and are negative.   Past Medical History  Diagnosis Date  . Hypertension   . Heart murmur   . Anxiety   . Arrhythmia   . Arthritis   . Atrial  fibrillation ([place])   . Acute heart failure ([place])   . Hyperlipemia     Past Surgical History  Procedure Laterality Date  . Tonsillectomy    . Appendectomy    . Tumor removal      right arm  . Bladder suspension    . Abdominal hysterectomy    . Tee without cardioversion N/A 10/18/2014    Procedure: TRANSESOPHAGEAL ECHOCARDIOGRAM (TEE);  Surgeon: Thayer Headings, MD;  Location: [facility] ORS;  Service: Cardiovascular;  Laterality: N/A;  . Electrophysiologic study N/A 10/18/2014    Procedure: CARDIOVERSION;  Surgeon: Thayer Headings, MD;  Location: [facility] ORS;  Service: Cardiovascular;  Laterality: N/A;    Family History  Problem Relation Age of Onset  . Osteoporosis Mother   . Heart disease Mother   . Cancer Father   . Diabetes Maternal Aunt     Social History:  Married.  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 She reports that she has never smoked. She has never used smokeless tobacco. She reports that she does not drink alcohol or use illicit drugs.     Allergies  Allergen Reactions  . Ciprofloxacin     REACTION: dizzy  . Ciprofloxacin Nausea And Vomiting  . Hydrochlorothiazide Nausea And Vomiting  . Hydrochlorothiazide W-Triamterene     REACTION: nausea  . Sulfa Antibiotics Nausea And Vomiting  . Sulfonamide Derivatives     REACTION: nausea \\T \ vomiting  . Valsartan     REACTION: angioedema - probable  . Valsartan Nausea And Vomiting  Medications Prior to Admission  Medication Sig Dispense Refill  . amiodarone (PACERONE) 200 MG tablet Take 1 tablet (200 mg total) by mouth daily. (Patient taking differently: Take 100 mg by mouth daily. ) 90 tablet 3  . amLODipine (NORVASC) 2.5 MG tablet Take 1 tablet (2.5 mg total) by mouth daily. 90 tablet 3  . apixaban (ELIQUIS) 5 MG TABS tablet Take 1 tablet (5 mg total) by mouth 2 (two) times daily. 180 tablet 3  . BEE POLLEN PO Take 2 tablets by mouth daily.     . Cholecalciferol 1000 UNITS  capsule Take 2,000 Units by mouth daily.     Marland Kitchen CINNAMON PO Take 2 tablets by mouth daily.     . Coenzyme Q10-Fish Oil-Vit E (CO-Q 10 OMEGA-3 FISH OIL) CAPS Take 1 capsule by mouth 2 (two) times daily.    Marland Kitchen CRANBERRY PO Take 1 tablet by mouth 2 (two) times daily.     Marland Kitchen DANDELION PO Take 1 tablet by mouth 2 (two) times daily.    Marland Kitchen EYEBRIGHT PO Take 1 tablet by mouth daily.    Marland Kitchen Hyaluronic Acid-Vitamin C (HYALURONIC ACID PO) Take 2 tablets by mouth daily.    . L-GLUTAMINE PO Take 1 tablet by mouth 2 (two) times daily.     . metoprolol tartrate (LOPRESSOR) 25 MG tablet Take 0.5 tablets (12.5 mg total) by mouth 2 (two) times daily. 90 tablet 3  . MILK THISTLE PO Take 1 tablet by mouth daily.     Marland Kitchen MISC NATURAL PRODUCTS PO Take 1 tablet by mouth daily. Advanced BP Support-Dr.Sinatra ([place]'s #1 integrated cardiologist)    . MISC NATURAL PRODUCTS PO Take 1 tablet by mouth daily. Multivitamin-Dr. Talmage Coin    . MISC NATURAL PRODUCTS PO Take 4 tablets by mouth daily. [employer]    . MISC NATURAL PRODUCTS PO Take 1 tablet by mouth daily. Ashwagandha    . MISC NATURAL PRODUCTS PO Take 5 tablets by mouth daily. Bone-Up by Gus Height    . MISC NATURAL PRODUCTS PO Advanced Glucosamine Chondroitin-MSM Natures Plus Ultra Rx Joint Liquid 1/2 capful per day    . MISC NATURAL PRODUCTS PO Take 2 tablets by mouth daily. Curcumin    . OVER THE COUNTER MEDICATION Take 2 tablets by mouth daily. Omega Q Plus w/resveratrol-Dr. Talmage Coin    . Probiotic CAPS Take 2 capsules by mouth daily.    . Probiotic Product (PROBIOTIC DAILY PO) Dr. Gertie Exon Probiotics one tablet daily    . QUERCETIN PO Take 1-2 tablets by mouth daily. w/bromelain    . ROYAL JELLY PO Take 1 tablet by mouth daily.    Marland Kitchen RUTIN PO Take 1 capsule by mouth 2 (two) times daily.    . betamethasone dipropionate (DIPROLENE) 0.05 % cream Apply topically 2 (two) times daily. (Patient not taking: Reported on 06/27/2015) 30 g 3  . estradiol (ESTRACE) 0.1  MG/GM vaginal cream Place 1 Applicatorful vaginally once a week. (Patient not taking: Reported on 06/27/2015) 42.5 g 12    Home: Home Living Family/patient expects to be discharged to:: Private residence Living Arrangements: Spouse/significant other Available Help at Discharge: Family, Available 24 hours/day (spouse 24/7 supervision. he is 48 years old, slow mobility d) Type of Home: House Home Access: Stairs to enter [employer] of Steps: 3 Entrance Stairs-Rails: None Home Layout: One level Bathroom Shower/Tub: Multimedia programmer: Handicapped height Bathroom Accessibility: Yes Home Equipment: None  Lives With: Spouse   Functional History: Prior Function Level of Independence:  Independent Comments: Indep with all household/community mobility; + driving; denies fall history  Functional Status:  Mobility: Bed Mobility Overal bed mobility: Needs Assistance Bed Mobility: Supine to Sit, Sit to Supine Supine to sit: Min guard (Increased time required. ) Sit to supine: Min assist (Assist for RLE back to bed) General bed mobility comments: HOB flat with no use of bed rail. Increased time required. Transfers Overall transfer level: Needs assistance Equipment used: Rolling walker (2 wheeled) Transfers: Sit to/from Stand Sit to Stand: Mod assist, +2 safety/equipment Stand pivot transfers: Mod assist Squat pivot transfers: Mod assist General transfer comment: Not assessed at this time Ambulation/Gait Ambulation/Gait assistance: Mod assist, +2 safety/equipment Ambulation Distance (Feet): 8 Feet Assistive device: Rolling walker (2 wheeled) Gait Pattern/deviations: Step-to pattern, Decreased stance time - right, Decreased weight shift to right, Narrow base of support General Gait Details: verbal cues for sequence, RW positioning, weight shifting, pt tends to remain on L side of RW however able to correct with cues, mod assist for balance with occasional increased  leaning on right side Gait velocity: very slow    ADL: ADL Overall ADL's : Needs assistance/impaired Eating/Feeding: Minimal assistance, Sitting Grooming: Minimal assistance, Sitting, Brushing hair, Oral care Upper Body Bathing: Moderate assistance, Sitting Lower Body Bathing: Maximal assistance, Sit to/from stand Upper Body Dressing : Minimal assistance, Sitting Lower Body Dressing: Maximal assistance, Sit to/from stand General ADL Comments: Educated pt on incorporating R UE into functional activities. WB through RUE performed with seating grooming activities. Performed table slides x 10 and grip with washcloth x 10.   Cognition: Cognition Overall Cognitive Status: Within Functional Limits for tasks assessed Orientation Level: Oriented X4 Cognition Arousal/Alertness: Awake/alert Behavior During Therapy: Anxious (Tearful) Overall Cognitive Status: Within Functional Limits for tasks assessed   Physical Exam: Blood pressure 163/77, pulse 100, temperature 97.7 F (36.5 C), temperature source Oral, resp. rate 20, height 5' 5.5" (1.664 m), weight 57.153 kg (126 lb), SpO2 99 %. Physical Exam  Nursing note and vitals reviewed. Constitutional: She is oriented to person, place, and time. She appears well-developed and well-nourished.  HENT:  Head: Normocephalic and atraumatic.  Mouth/Throat: Oropharynx is clear and moist.  Eyes: Conjunctivae and EOM are normal. Pupils are equal, round, and reactive to light.  Neck: Normal range of motion. Neck supple.  Cardiovascular: Normal rate and regular rhythm.   No murmur heard. Respiratory: Effort normal and breath sounds normal. No respiratory distress. She has no wheezes.  GI: Soft. Bowel sounds are normal. She exhibits no distension. There is no tenderness.  Musculoskeletal: She exhibits no edema or tenderness.  Varicose veins noted BLE.   Neurological: She is alert and oriented to person, place, and time. A cranial nerve deficit is  present.  Right facial weakness. Speech with minimal dysarthria.   Motor strength is 2-/5 right deltoid, 3-/5 biceps, triceps and 3+/5 finger flexors/ finger extensors. 4-/5 right HF, KE, ADF/APF. 5/5 in the left deltoid, biceps, triceps, grip, hip flexor, knee extensor, ankle dorsiflexor and plantar flexor Sensation is intact to light touch in bilateral upper and lower limbs. Tone shows no evidence of spasticity deep tendon reflexes are 3+ on the right and 2+ on the left  Skin: Skin is warm and dry.  Psychiatric: She has a normal mood and affect. Her behavior is normal. Thought content normal.    Results for orders placed or performed during the hospital encounter of 06/27/15 (from the past 48 hour(s))  CBC     Status: None   Collection  Time: 07/03/15  2:24 AM  Result Value Ref Range   WBC 5.5 4.0 - 10.5 K/uL   RBC 4.41 3.87 - 5.11 MIL/uL   Hemoglobin 14.2 12.0 - 15.0 g/dL   HCT 42.4 36.0 - 46.0 %   MCV 96.1 78.0 - 100.0 fL   MCH 32.2 26.0 - 34.0 pg   MCHC 33.5 30.0 - 36.0 g/dL   RDW 12.7 11.5 - 15.5 %   Platelets 192 150 - 400 K/uL   No results found.  Medical Problem List and Plan: 1.  Right facial weakness and right hemiparesis with inability to carry out ADL tasks as well as gait disorder secondary to left basal ganglia stroke.  2.  DVT Prophylaxis/Anticoagulation: Pharmaceutical: Other (comment)-eliquis 3. Pain Management: tylenol prn for pain 4. Mood: Very motivated. LCSW to follow for evaluation and support.  5. Neuropsych: This patient is capable of making decisions on his own behalf. 6. Skin/Wound Care: Routine pressure relief measures.  7. Fluids/Electrolytes/Nutrition: Monitor I/O. Check lytes in am. 8. Paroxymal A fib: Monitor heart rate bid. Monitor for symptoms with increase in activity level. Continue metoprolol 25 mg bid and amiodarone 100 mg daily.  9. Dyslipidemia: Continue lipitor. 10. HTN: monitor BP bid. Continue Norvasc and metoprolol.  11. Chronic systolic  CHF: Off lasix. Question angioedema with ARB therefore not on ACE. Monitor daily weights--heart healthy diet.  Continue metoprolol and statin.  10 IBS-D: Will resume probiotic. Reviewed hospital policy on supplements with patient/family.  Being managed with diet.   Post Admission Physician Evaluation: 1. Functional deficits secondary  to left basal ganglia stroke. 2. Patient is admitted to receive collaborative, interdisciplinary care between the physiatrist, rehab nursing staff, and therapy team. 3. Patient's level of medical complexity and substantial therapy needs in context of that medical necessity cannot be provided at a lesser intensity of care such as a SNF. 4. Patient has experienced substantial functional loss from his/her baseline which was documented above under the "Functional History" and "Functional Status" headings.  Judging by the patient's diagnosis, physical exam, and functional history, the patient has potential for functional progress which will result in measurable gains while on inpatient rehab.  These gains will be of substantial and practical use upon discharge  in facilitating mobility and self-care at the household level. 5. Physiatrist will provide 24 hour management of medical needs as well as oversight of the therapy plan/treatment and provide guidance as appropriate regarding the interaction of the two. 6. 24 hour rehab nursing will assist with bladder management, bowel management, safety, skin/wound care, disease management, medication administration, pain management and patient education and help integrate therapy concepts, techniques, stroke education, etc. 7. PT will assess and treat for/with: Lower extremity strength, range of motion, stamina, balance, functional mobility, safety, adaptive techniques and equipment, coping skills, pain control, education.   Goals are: Mod I/Supervision. 8. OT will assess and treat for/with: ADL's, functional mobility, safety, upper  extremity strength, adaptive techniques and equipment, ego support, and community reintegration.   Goals are: Mod I/Supervision. Therapy may proceed with showering this patient. 9. SLP will assess and treat for/with: speech, higher level cognition.  Goals are: Mod I/Supervision. 10. Case Management and Social Worker will assess and treat for psychological issues and discharge planning. 11. Team conference will be held weekly to assess progress toward goals and to determine barriers to discharge. 12. Patient will receive at least 3 hours of therapy per day at least 5 days per week. 13. ELOS: 10-14 days.  14. Prognosis:  good  Delice Lesch, MD 07/03/2015

## 2015-07-03 NOTE — Interval H&P Note (Signed)
Karen Bowman was admitted today to Inpatient Rehabilitation with the diagnosis of left basal ganglia stroke.  The patient's history has been reviewed, patient examined, and there is no change in status.  Patient continues to be appropriate for intensive inpatient rehabilitation.  I have reviewed the patient's chart and labs.  Questions were answered to the patient's satisfaction. The PAPE has been reviewed and assessment remains appropriate.  Karen Bowman Lorie Phenix 07/03/2015, 6:54 PM

## 2015-07-03 NOTE — PMR Pre-admission (Signed)
PMR Admission Coordinator Pre-Admission Assessment  Patient: Karen Bowman is an 75 y.o., female MRN: [id number] DOB: 1940/07/29 Height: 5' 5.5" (166.4 cm) Weight: 57.153 kg (126 lb)              Insurance Information HMO:     PPO: yes     PCP:      IPA:      80/20:      OTHER: medicare replacement policy PRIMARY: [employer]      Policy#: [id number]      Subscriber: pt CM Name: Tye [place]      Phone#: [phone] ext [phone]     Fax#: [phone] Pre-Cert#: [id number]      Employer: retired.. f/u on 4/10 with different Cathy at ext [phone] same fax Benefits:  Phone #: [phone]     Name: 3/31 Eff. Date: 04/01/13     Deduct: $200      Out of Pocket Max: $1000      Life Max: none CIR: 80%      SNF: no copay days 1-20: $135 copay per day days 21-100; $180 per day days after day 100 Outpatient: 80%     Co-Pay: no visit limit Home Health: 80%      Co-Pay: 20% DME: 80%     Co-Pay: 20% Providers: in network  SECONDARY: none     Medicaid Application Date:       Case Manager:  Disability Application Date:       Case Worker:   Emergency Contact Information Contact Information    Name Relation Home Work Mobile   [place] Spouse [phone]     Sistine, Haslett [phone]  [phone]   Khia, Schmaltz   [phone]     Current Medical History  Patient Admitting Diagnosis: left basal ganglia infarct  History of Present Illness: Karen Bowman is a 75 y.o. right handed female with history of hypertension, systolic congestive heart failure, atrial fibrillation maintained on Eliquis. Patient lives with spouse independent prior to admission.  Presented 06/28/2015 with right-sided weakness and slurred speech. MRI of the brain showed acute left basal ganglia infarct. MRA of the head with no large vessel occlusion or stenosis. Patient did not receive TPA. Echocardiogram with ejection fraction 60% no wall motion abnormalities. CTA angiogram of head and neck with moderate to severe  bilateral proximal PCA stenosis. Neurology follow-up currently remains on Eliquis as well as low-dose aspirin. Tolerating a regular diet.   Total: 5 NIH    Past Medical History  Past Medical History  Diagnosis Date  . Hypertension   . Heart murmur   . Anxiety   . Arrhythmia   . Arthritis   . Atrial fibrillation ([place])   . Acute heart failure ([place])   . Hyperlipemia     Family History  family history includes Cancer in her father; Diabetes in her maternal aunt; Heart disease in her mother; Osteoporosis in her mother.  Prior Rehab/Hospitalizations:  Has the patient had major surgery during 100 days prior to admission? No  Current Medications   Current facility-administered medications:  .  amiodarone (PACERONE) tablet 100 mg, 100 mg, Oral, Daily, Rise Patience, MD, 100 mg at 07/03/15 1055 .  amLODipine (NORVASC) tablet 2.5 mg, 2.5 mg, Oral, Daily, Rise Patience, MD, 2.5 mg at 07/03/15 1054 .  apixaban (ELIQUIS) tablet 5 mg, 5 mg, Oral, BID, Rosalin Hawking, MD, 5 mg at 07/03/15 1053 .  aspirin EC tablet 81 mg, 81 mg, Oral, Daily, Rosalin Hawking, MD, [phone]  mg at 07/03/15 1054 .  atorvastatin (LIPITOR) tablet 40 mg, 40 mg, Oral, q1800, Lavina Hamman, MD, 40 mg at 07/02/15 1734 .  metoprolol tartrate (LOPRESSOR) tablet 12.5 mg, 12.5 mg, Oral, BID, Rise Patience, MD, 12.5 mg at 07/03/15 1054 .  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 None of the above  Prior Functional Level Prior Function Level of Independence: Independent Comments: Indep with all household/community mobility; + driving; denies fall history  Self Care: Did the patient need help bathing, dressing, using the toilet or eating?  Independent  Indoor Mobility: Did the patient need assistance with walking from room to room (with or without device)? Independent  Stairs: Did the patient need assistance with internal or external stairs (with or without device)? Independent  Functional Cognition: Did the patient need help planning regular tasks such as shopping or remembering to take medications? Independent  Current Functional Level Cognition  Overall Cognitive Status: Within Functional Limits for tasks assessed Orientation Level: Oriented X4    Extremity Assessment (includes Sensation/Coordination)  Upper Extremity Assessment: RUE deficits/detail RUE Deficits / Details: Limited AROM with shoulder and elbow flexion; full PROM. Shoulder flex/ext 2/5, elbow 4/5. Decreased grip strength. Pt reports no sensation deficits. Decreased fine/gross motor coordination noted. RUE Coordination: decreased fine motor, decreased gross motor  Lower Extremity Assessment: Defer to PT evaluation RLE Deficits / Details: AAROM WNL; hip flexion/extension 3-, knee extensin 3-, ankle DF 3+ RLE Sensation:  (intact)    ADLs  Overall ADL's : Needs assistance/impaired Eating/Feeding: Minimal assistance, Sitting Grooming: Minimal assistance, Sitting, Brushing hair, Oral care Upper Body Bathing: Moderate assistance, Sitting Lower Body Bathing: Maximal assistance, Sit to/from stand Upper Body Dressing : Minimal assistance, Sitting Lower Body Dressing: Maximal assistance, Sit to/from stand General ADL Comments: Educated pt on  incorporating R UE into functional activities. WB through RUE performed with seating grooming activities. Performed table slides x 10 and grip with washcloth x 10.     Mobility  Overal bed mobility: Needs Assistance Bed Mobility: Supine to Sit, Sit to Supine Supine to sit: Min guard (Increased time required. ) Sit to supine: Min assist (Assist for RLE back to bed) General bed mobility comments: HOB flat with no use of bed rail. Increased time required.    Transfers  Overall transfer level: Needs assistance Equipment used: Rolling walker (2 wheeled) Transfers: Sit to/from Stand Sit to Stand: Mod assist, +2 safety/equipment Stand pivot transfers: Mod assist Squat pivot transfers: Mod assist General transfer comment: Not assessed at this time    Ambulation / Gait / Stairs / Wheelchair Mobility  Ambulation/Gait Ambulation/Gait assistance: Mod assist, +2 safety/equipment Ambulation Distance (Feet): 8 Feet Assistive device: Rolling walker (2 wheeled) Gait Pattern/deviations: Step-to pattern, Decreased stance time - right, Decreased weight shift to right, Narrow base of support General Gait Details: verbal cues for sequence, RW positioning, weight  shifting, pt tends to remain on L side of RW however able to correct with cues, mod assist for balance with occasional increased leaning on right side Gait velocity: very slow    Posture / Balance Dynamic Sitting Balance Sitting balance - Comments: initial Rt lean, however likely due to mattress position; once adjusted maintained midline with no assist Balance Overall balance assessment: Needs assistance Sitting-balance support: Feet supported Sitting balance-Leahy Scale: Fair Sitting balance - Comments: initial Rt lean, however likely due to mattress position; once adjusted maintained midline with no assist Standing balance support: Bilateral upper extremity supported Standing balance-Leahy Scale: Poor    Special needs/care consideration Skin  intact                               Bowel mgmt: continent LBM 4/1/ Bladder mgmt: continent   Previous Home Environment Living Arrangements: Spouse/significant other  Lives With: Spouse Available Help at Discharge: Family, Available 24 hours/day (spouse 24/7 supervision. he is 54 years old, slow mobility d) Type of Home: House Home Layout: One level Home Access: Stairs to enter Entrance Stairs-Rails: None Technical brewer of Steps: 3 Bathroom Shower/Tub: Multimedia programmer: Handicapped height Bathroom Accessibility: Yes How Accessible: Accessible via walker [place]: No  Discharge Living Setting Plans for Discharge Living Setting: Patient's home, Lives with (comment) (34 yo spouse) Type of Home at Discharge: House Discharge Home Layout: One level Discharge Home Access: Stairs to enter Entrance Stairs-Rails: None Entrance Stairs-Number of Steps: 3 Discharge Bathroom Shower/Tub: Walk-in shower Discharge Bathroom Toilet: Handicapped height Discharge Bathroom Accessibility: Yes How Accessible: Accessible via walker Does the patient have any problems obtaining your medications?: No  Social/Family/Support Systems Patient Roles: Spouse, Parent (2 adult sons out of state) Contact Information: spouse Anticipated Caregiver: spouse and hired assist possible Anticipated Ambulance person Information: see above Ability/Limitations of Caregiver: spouse is 86 years old, slow mobility Caregiver Availability: 24/7 Discharge Plan Discussed with Primary Caregiver: Yes Is Caregiver In Agreement with Plan?: Yes Does Caregiver/Family have Issues with Lodging/Transportation while Pt is in Rehab?: No Son in [place] currently here when pt admitted. Other son in [place].  Goals/Additional Needs Patient/Family Goal for Rehab: Mod I to supervision with PT, OT, and SLP Expected length of stay: 10-14 days Pt/Family Agrees to Admission and willing to participate:  Yes Program Orientation Provided & Reviewed with Pt/Caregiver Including Roles  & Responsibilities: Yes  Decrease burden of Care through IP rehab admission: n/a  Possible need for SNF placement upon discharge: not anticipated, but pt is concerned that she may have to hire assist  Patient Condition: This patient's medical and functional status has changed since the consult dated: 06/29/2015 in which the Rehabilitation Physician determined and documented that the patient's condition is appropriate for intensive rehabilitative care in an inpatient rehabilitation facility. See "History of Present Illness" (above) for medical update. Functional changes are: mod assist. Patient's medical and functional status update has been discussed with the Rehabilitation physician and patient remains appropriate for inpatient rehabilitation. Will admit to inpatient rehab today.  Preadmission Screen Completed By:  Cleatrice Burke, 07/03/2015 11:07 AM ______________________________________________________________________   Discussed status with Dr. Posey Pronto on 07/03/2015 at  1107 and received telephone approval for admission today.  Admission Coordinator:  Cleatrice Burke, time [id number] Date 07/03/2015.

## 2015-07-03 NOTE — Progress Notes (Signed)
Karen Gong, RN Rehab Admission Coordinator Signed Physical Medicine and Rehabilitation PMR Pre-admission 07/03/2015 11:00 AM  Related encounter: ED to Hosp-Admission (Discharged) from 06/27/2015 in [place] [facility]   PMR Admission Coordinator Pre-Admission Assessment  Patient: Karen Bowman is an 75 y.o., female MRN: [id number] DOB: 27-Aug-1940 Height: 5' 5.5" (166.4 cm) Weight: 57.153 kg (126 lb)  Insurance Information HMO: PPO: yes PCP: IPA: 80/20: OTHER: medicare replacement policy PRIMARY: [employer] Medicare Policy#: [id number] Subscriber: pt CM Name: Tye [place] Phone#: [phone] ext [phone] Fax#: [phone] Pre-Cert#: [id number] Employer: retired.. f/u on 4/10 with different Cathy at ext [phone] same fax Benefits: Phone #: [phone] Name: 3/31 Eff. Date: 04/01/13 Deduct: $200 Out of Pocket Max: $1000 Life Max: none CIR: 80% SNF: no copay days 1-20: $135 copay per day days 21-100; $180 per day days after day 100 Outpatient: 80% Co-Pay: no visit limit Home Health: 80% Co-Pay: 20% DME: 80% Co-Pay: 20% Providers: in network  SECONDARY: none  Medicaid Application Date: Case Manager:  Disability Application Date: Case Worker:   Emergency Contact Information Contact Information    Name Relation Home Work Mobile   [place] Spouse [phone]     Makayela, Harner [phone]  [phone]   Kymm, Sock   [phone]     Current Medical History  Patient Admitting Diagnosis: left basal ganglia infarct  History of Present Illness: Karen Bowman is a 75 y.o. right handed female with history of hypertension, systolic congestive heart failure,  atrial fibrillation maintained on Eliquis. Patient lives with spouse independent prior to admission. Presented 06/28/2015 with right-sided weakness and slurred speech. MRI of the brain showed acute left basal ganglia infarct. MRA of the head with no large vessel occlusion or stenosis. Patient did not receive TPA. Echocardiogram with ejection fraction 60% no wall motion abnormalities. CTA angiogram of head and neck with moderate to severe bilateral proximal PCA stenosis. Neurology follow-up currently remains on Eliquis as well as low-dose aspirin. Tolerating a regular diet.   Total: 5 NIH    Past Medical History  Past Medical History  Diagnosis Date  . Hypertension   . Heart murmur   . Anxiety   . Arrhythmia   . Arthritis   . Atrial fibrillation ([place])   . Acute heart failure ([place])   . Hyperlipemia     Family History  family history includes Cancer in her father; Diabetes in her maternal aunt; Heart disease in her mother; Osteoporosis in her mother.  Prior Rehab/Hospitalizations:  Has the patient had major surgery during 100 days prior to admission? No  Current Medications   Current facility-administered medications:  . amiodarone (PACERONE) tablet 100 mg, 100 mg, Oral, Daily, Rise Patience, MD, 100 mg at 07/03/15 1055 . amLODipine (NORVASC) tablet 2.5 mg, 2.5 mg, Oral, Daily, Rise Patience, MD, 2.5 mg at 07/03/15 1054 . apixaban (ELIQUIS) tablet 5 mg, 5 mg, Oral, BID, Rosalin Hawking, MD, 5 mg at 07/03/15 1053 . aspirin EC tablet 81 mg, 81 mg, Oral, Daily, Rosalin Hawking, MD, 81 mg at 07/03/15 1054 . atorvastatin (LIPITOR) tablet 40 mg, 40 mg, Oral, q1800, Lavina Hamman, MD, 40 mg at 07/02/15 1734 . metoprolol tartrate (LOPRESSOR) tablet 12.5 mg, 12.5 mg, Oral, BID, Rise Patience, MD, 12.5 mg at 07/03/15 1054 . 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 None of the above  Prior Functional Level Prior Function Level of Independence: Independent Comments: Indep with all household/community mobility; + driving; denies fall history  Self Care: Did the patient need help bathing, dressing, using the toilet or eating? Independent  Indoor Mobility: Did the patient need assistance with walking from room to room (with or without device)? Independent  Stairs: Did the patient need assistance with internal or external stairs (with or without device)? Independent  Functional Cognition: Did the patient need help planning regular tasks such as shopping or remembering to take medications? Independent  Current Functional Level Cognition  Overall Cognitive Status: Within Functional Limits for tasks assessed Orientation Level: Oriented X4   Extremity Assessment (includes Sensation/Coordination)  Upper Extremity Assessment: RUE deficits/detail RUE Deficits / Details: Limited AROM with shoulder and elbow flexion; full PROM. Shoulder flex/ext 2/5, elbow 4/5. Decreased grip strength. Pt reports no sensation deficits. Decreased fine/gross motor coordination noted. RUE Coordination: decreased fine motor, decreased gross motor  Lower Extremity Assessment: Defer to PT evaluation RLE Deficits / Details: AAROM WNL; hip flexion/extension 3-, knee  extensin 3-, ankle DF 3+ RLE Sensation: (intact)    ADLs  Overall ADL's : Needs assistance/impaired Eating/Feeding: Minimal assistance, Sitting Grooming: Minimal assistance, Sitting, Brushing hair, Oral care Upper Body Bathing: Moderate assistance, Sitting Lower Body Bathing: Maximal assistance, Sit to/from stand Upper Body Dressing : Minimal assistance, Sitting Lower Body Dressing: Maximal assistance, Sit to/from stand General ADL Comments: Educated pt on incorporating R UE into functional activities. WB through RUE performed with seating grooming activities. Performed table slides x 10 and grip with washcloth x 10.     Mobility  Overal bed mobility: Needs Assistance Bed Mobility: Supine to Sit, Sit to Supine Supine to sit: Min guard (Increased time required. ) Sit to supine: Min assist (Assist for RLE back to bed) General bed mobility comments: HOB flat with no use of bed rail. Increased time required.    Transfers  Overall transfer level: Needs assistance Equipment used: Rolling walker (2 wheeled) Transfers: Sit to/from Stand Sit to Stand: Mod assist, +2 safety/equipment Stand pivot transfers: Mod assist Squat pivot transfers: Mod assist General transfer comment: Not assessed at this time    Ambulation / Gait / Stairs / Wheelchair Mobility  Ambulation/Gait Ambulation/Gait assistance: Mod assist, +2 safety/equipment Ambulation Distance (Feet): 8 Feet Assistive device: Rolling walker (2 wheeled) Gait Pattern/deviations: Step-to pattern, Decreased stance time - right, Decreased weight shift to right, Narrow base of support General Gait Details: verbal cues for sequence, RW positioning, weight shifting, pt tends to remain on L side of RW however able to correct with cues, mod assist for balance with occasional increased leaning on right side Gait velocity: very slow    Posture / Balance Dynamic Sitting Balance Sitting balance - Comments: initial Rt lean, however  likely due to mattress position; once adjusted maintained midline with no assist Balance Overall balance assessment: Needs assistance Sitting-balance support: Feet supported Sitting balance-Leahy Scale: Fair Sitting balance - Comments:  initial Rt lean, however likely due to mattress position; once adjusted maintained midline with no assist Standing balance support: Bilateral upper extremity supported Standing balance-Leahy Scale: Poor    Special needs/care consideration Skin intact  Bowel mgmt: continent LBM 4/1/ Bladder mgmt: continent   Previous Home Environment Living Arrangements: Spouse/significant other Lives With: Spouse Available Help at Discharge: Family, Available 24 hours/day (spouse 24/7 supervision. he is 61 years old, slow mobility d) Type of Home: House Home Layout: One level Home Access: Stairs to enter Entrance Stairs-Rails: None Technical brewer of Steps: 3 Bathroom Shower/Tub: Multimedia programmer: Handicapped height Bathroom Accessibility: Yes How Accessible: Accessible via walker [place]: No  Discharge Living Setting Plans for Discharge Living Setting: Patient's home, Lives with (comment) (43 yo spouse) Type of Home at Discharge: House Discharge Home Layout: One level Discharge Home Access: Stairs to enter Entrance Stairs-Rails: None Entrance Stairs-Number of Steps: 3 Discharge Bathroom Shower/Tub: Walk-in shower Discharge Bathroom Toilet: Handicapped height Discharge Bathroom Accessibility: Yes How Accessible: Accessible via walker Does the patient have any problems obtaining your medications?: No  Social/Family/Support Systems Patient Roles: Spouse, Parent (2 adult sons out of state) Contact Information: spouse Anticipated Caregiver: spouse and hired assist possible Anticipated Ambulance person Information: see above Ability/Limitations of Caregiver: spouse is 78 years old, slow  mobility Caregiver Availability: 24/7 Discharge Plan Discussed with Primary Caregiver: Yes Is Caregiver In Agreement with Plan?: Yes Does Caregiver/Family have Issues with Lodging/Transportation while Pt is in Rehab?: No Son in [place] currently here when pt admitted. Other son in [place].  Goals/Additional Needs Patient/Family Goal for Rehab: Mod I to supervision with PT, OT, and SLP Expected length of stay: 10-14 days Pt/Family Agrees to Admission and willing to participate: Yes Program Orientation Provided & Reviewed with Pt/Caregiver Including Roles & Responsibilities: Yes  Decrease burden of Care through IP rehab admission: n/a  Possible need for SNF placement upon discharge: not anticipated, but pt is concerned that she may have to hire assist  Patient Condition: This patient's medical and functional status has changed since the consult dated: 06/29/2015 in which the Rehabilitation Physician determined and documented that the patient's condition is appropriate for intensive rehabilitative care in an inpatient rehabilitation facility. See "History of Present Illness" (above) for medical update. Functional changes are: mod assist. Patient's medical and functional status update has been discussed with the Rehabilitation physician and patient remains appropriate for inpatient rehabilitation. Will admit to inpatient rehab today.  Preadmission Screen Completed By: Karen Bowman, 07/03/2015 11:07 AM ______________________________________________________________________  Discussed status with Dr. Posey Pronto on 07/03/2015 at 1107 and received telephone approval for admission today.  Admission Coordinator: Karen Bowman, time [id number] Date 07/03/2015.          Cosigned by: Ankit Lorie Phenix, MD at 07/03/2015 11:25 AM  Revision History     Date/Time User Provider Type Action   07/03/2015 11:25 AM Ankit Lorie Phenix, MD Physician Cosign   07/03/2015 11:10 AM Karen Gong, RN Rehab  Admission Coordinator Sign

## 2015-07-03 NOTE — H&P (Signed)
Physical Medicine and Rehabilitation Admission H&P    Chief Complaint  Patient presents with  . Right sided weakness, right facial weakness, slurred speech   HPI:  Karen Bowman is a 75 y.o. right handed female with history of HTN, chronic systolic congestive heart failure, A fib- on Eliquis; who was admitted on 06/28/2015 with right-sided weakness and slurred speech. MRI of the brain showed acute left basal ganglia infarct. MRA of the head was negative for large vessel occlusion or stenosis. Patient did not receive TPA. Echocardiogram with ejection fraction 60% no wall motion abnormalities. CTA angiogram of head and neck showed severe R-P2 PCA stenosis.  Dr. Erlinda Bowman felt that stroke likely due to small vessel disease and low dose ASA added for secondary stroke prevention.  She is tolerating a regular diet but continues to have mild dysarthria with right facial weakness and right sided weakness affecting ability to carry out ADL tasks and mobility. CIR recommended for follow up therapy and patient was admitted for a comprehensive rehabilitation program  Review of Systems  HENT: Negative for hearing loss.   Eyes: Negative for blurred vision, double vision and pain.  Respiratory: Positive for cough. Negative for shortness of breath.        Stuffy nose  Cardiovascular: Negative for chest pain and palpitations.  Gastrointestinal: Negative for heartburn, nausea and vomiting.       IBS managed by diet.   Genitourinary: Negative for urgency and frequency.  Musculoskeletal: Negative for myalgias, back pain and neck pain.  Neurological: Positive for speech change and focal weakness. Negative for dizziness and headaches.  Psychiatric/Behavioral: The patient is nervous/anxious and has insomnia (stuffy nose/PND).   All other systems reviewed and are negative.   Past Medical History  Diagnosis Date  . Hypertension   . Heart murmur   . Anxiety   . Arrhythmia   . Arthritis   . Atrial  fibrillation ([place])   . Acute heart failure ([name])   . Hyperlipemia     Past Surgical History  Procedure Laterality Date  . Tonsillectomy    . Appendectomy    . Tumor removal      right arm  . Bladder suspension    . Abdominal hysterectomy    . Tee without cardioversion N/A 10/18/2014    Procedure: TRANSESOPHAGEAL ECHOCARDIOGRAM (TEE);  Surgeon: Thayer Headings, MD;  Location: [facility] ORS;  Service: Cardiovascular;  Laterality: N/A;  . Electrophysiologic study N/A 10/18/2014    Procedure: CARDIOVERSION;  Surgeon: Thayer Headings, MD;  Location: [facility] ORS;  Service: Cardiovascular;  Laterality: N/A;    Family History  Problem Relation Age of Onset  . Osteoporosis Mother   . Heart disease Mother   . Cancer Father   . Diabetes Maternal Aunt     Social History:  Married.  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 She reports that she has never smoked. She has never used smokeless tobacco. She reports that she does not drink alcohol or use illicit drugs.     Allergies  Allergen Reactions  . Ciprofloxacin     REACTION: dizzy  . Ciprofloxacin Nausea And Vomiting  . Hydrochlorothiazide Nausea And Vomiting  . Hydrochlorothiazide W-Triamterene     REACTION: nausea  . Sulfa Antibiotics Nausea And Vomiting  . Sulfonamide Derivatives     REACTION: nausea \\T \ vomiting  . Valsartan     REACTION: angioedema - probable  . Valsartan Nausea And Vomiting  Medications Prior to Admission  Medication Sig Dispense Refill  . amiodarone (PACERONE) 200 MG tablet Take 1 tablet (200 mg total) by mouth daily. (Patient taking differently: Take 100 mg by mouth daily. ) 90 tablet 3  . amLODipine (NORVASC) 2.5 MG tablet Take 1 tablet (2.5 mg total) by mouth daily. 90 tablet 3  . apixaban (ELIQUIS) 5 MG TABS tablet Take 1 tablet (5 mg total) by mouth 2 (two) times daily. 180 tablet 3  . BEE POLLEN PO Take 2 tablets by mouth daily.     . Cholecalciferol 1000 UNITS  capsule Take 2,000 Units by mouth daily.     Marland Kitchen CINNAMON PO Take 2 tablets by mouth daily.     . Coenzyme Q10-Fish Oil-Vit E (CO-Q 10 OMEGA-3 FISH OIL) CAPS Take 1 capsule by mouth 2 (two) times daily.    Marland Kitchen CRANBERRY PO Take 1 tablet by mouth 2 (two) times daily.     Marland Kitchen DANDELION PO Take 1 tablet by mouth 2 (two) times daily.    Marland Kitchen EYEBRIGHT PO Take 1 tablet by mouth daily.    Marland Kitchen Hyaluronic Acid-Vitamin C (HYALURONIC ACID PO) Take 2 tablets by mouth daily.    . L-GLUTAMINE PO Take 1 tablet by mouth 2 (two) times daily.     . metoprolol tartrate (LOPRESSOR) 25 MG tablet Take 0.5 tablets (12.5 mg total) by mouth 2 (two) times daily. 90 tablet 3  . MILK THISTLE PO Take 1 tablet by mouth daily.     Marland Kitchen MISC NATURAL PRODUCTS PO Take 1 tablet by mouth daily. Advanced BP Support-Dr.Sinatra ([place]'s #1 integrated cardiologist)    . MISC NATURAL PRODUCTS PO Take 1 tablet by mouth daily. Multivitamin-Dr. Talmage Coin    . MISC NATURAL PRODUCTS PO Take 4 tablets by mouth daily. [employer]    . MISC NATURAL PRODUCTS PO Take 1 tablet by mouth daily. Ashwagandha    . MISC NATURAL PRODUCTS PO Take 5 tablets by mouth daily. Bone-Up by Gus Height    . MISC NATURAL PRODUCTS PO Advanced Glucosamine Chondroitin-MSM Natures Plus Ultra Rx Joint Liquid 1/2 capful per day    . MISC NATURAL PRODUCTS PO Take 2 tablets by mouth daily. Curcumin    . OVER THE COUNTER MEDICATION Take 2 tablets by mouth daily. Omega Q Plus w/resveratrol-Dr. Talmage Coin    . Probiotic CAPS Take 2 capsules by mouth daily.    . Probiotic Product (PROBIOTIC DAILY PO) Dr. Gertie Exon Probiotics one tablet daily    . QUERCETIN PO Take 1-2 tablets by mouth daily. w/bromelain    . ROYAL JELLY PO Take 1 tablet by mouth daily.    Marland Kitchen RUTIN PO Take 1 capsule by mouth 2 (two) times daily.    . betamethasone dipropionate (DIPROLENE) 0.05 % cream Apply topically 2 (two) times daily. (Patient not taking: Reported on 06/27/2015) 30 g 3  . estradiol (ESTRACE) 0.1  MG/GM vaginal cream Place 1 Applicatorful vaginally once a week. (Patient not taking: Reported on 06/27/2015) 42.5 g 12    Home: Home Living Family/patient expects to be discharged to:: Private residence Living Arrangements: Spouse/significant other Available Help at Discharge: Family, Available 24 hours/day (spouse 24/7 supervision. he is 99 years old, slow mobility d) Type of Home: House Home Access: Stairs to enter [employer] of Steps: 3 Entrance Stairs-Rails: None Home Layout: One level Bathroom Shower/Tub: Multimedia programmer: Handicapped height Bathroom Accessibility: Yes Home Equipment: None  Lives With: Spouse   Functional History: Prior Function Level of Independence:  Independent Comments: Indep with all household/community mobility; + driving; denies fall history  Functional Status:  Mobility: Bed Mobility Overal bed mobility: Needs Assistance Bed Mobility: Supine to Sit, Sit to Supine Supine to sit: Min guard (Increased time required. ) Sit to supine: Min assist (Assist for RLE back to bed) General bed mobility comments: HOB flat with no use of bed rail. Increased time required. Transfers Overall transfer level: Needs assistance Equipment used: Rolling walker (2 wheeled) Transfers: Sit to/from Stand Sit to Stand: Mod assist, +2 safety/equipment Stand pivot transfers: Mod assist Squat pivot transfers: Mod assist General transfer comment: Not assessed at this time Ambulation/Gait Ambulation/Gait assistance: Mod assist, +2 safety/equipment Ambulation Distance (Feet): 8 Feet Assistive device: Rolling walker (2 wheeled) Gait Pattern/deviations: Step-to pattern, Decreased stance time - right, Decreased weight shift to right, Narrow base of support General Gait Details: verbal cues for sequence, RW positioning, weight shifting, pt tends to remain on L side of RW however able to correct with cues, mod assist for balance with occasional increased  leaning on right side Gait velocity: very slow    ADL: ADL Overall ADL's : Needs assistance/impaired Eating/Feeding: Minimal assistance, Sitting Grooming: Minimal assistance, Sitting, Brushing hair, Oral care Upper Body Bathing: Moderate assistance, Sitting Lower Body Bathing: Maximal assistance, Sit to/from stand Upper Body Dressing : Minimal assistance, Sitting Lower Body Dressing: Maximal assistance, Sit to/from stand General ADL Comments: Educated pt on incorporating R UE into functional activities. WB through RUE performed with seating grooming activities. Performed table slides x 10 and grip with washcloth x 10.   Cognition: Cognition Overall Cognitive Status: Within Functional Limits for tasks assessed Orientation Level: Oriented X4 Cognition Arousal/Alertness: Awake/alert Behavior During Therapy: Anxious (Tearful) Overall Cognitive Status: Within Functional Limits for tasks assessed   Physical Exam: Blood pressure 163/77, pulse 100, temperature 97.7 F (36.5 C), temperature source Oral, resp. rate 20, height 5' 5.5" (1.664 m), weight 57.153 kg (126 lb), SpO2 99 %. Physical Exam  Nursing note and vitals reviewed. Constitutional: She is oriented to person, place, and time. She appears well-developed and well-nourished.  HENT:  Head: Normocephalic and atraumatic.  Mouth/Throat: Oropharynx is clear and moist.  Eyes: Conjunctivae and EOM are normal. Pupils are equal, round, and reactive to light.  Neck: Normal range of motion. Neck supple.  Cardiovascular: Normal rate and regular rhythm.   No murmur heard. Respiratory: Effort normal and breath sounds normal. No respiratory distress. She has no wheezes.  GI: Soft. Bowel sounds are normal. She exhibits no distension. There is no tenderness.  Musculoskeletal: She exhibits no edema or tenderness.  Varicose veins noted BLE.   Neurological: She is alert and oriented to person, place, and time. A cranial nerve deficit is  present.  Right facial weakness. Speech with minimal dysarthria.   Motor strength is 2-/5 right deltoid, 3-/5 biceps, triceps and 3+/5 finger flexors/ finger extensors. 4-/5 right HF, KE, ADF/APF. 5/5 in the left deltoid, biceps, triceps, grip, hip flexor, knee extensor, ankle dorsiflexor and plantar flexor Sensation is intact to light touch in bilateral upper and lower limbs. Tone shows no evidence of spasticity deep tendon reflexes are 3+ on the right and 2+ on the left  Skin: Skin is warm and dry.  Psychiatric: She has a normal mood and affect. Her behavior is normal. Thought content normal.    Results for orders placed or performed during the hospital encounter of 06/27/15 (from the past 48 hour(s))  CBC     Status: None   Collection  Time: 07/03/15  2:24 AM  Result Value Ref Range   WBC 5.5 4.0 - 10.5 K/uL   RBC 4.41 3.87 - 5.11 MIL/uL   Hemoglobin 14.2 12.0 - 15.0 g/dL   HCT 42.4 36.0 - 46.0 %   MCV 96.1 78.0 - 100.0 fL   MCH 32.2 26.0 - 34.0 pg   MCHC 33.5 30.0 - 36.0 g/dL   RDW 12.7 11.5 - 15.5 %   Platelets 192 150 - 400 K/uL   No results found.  Medical Problem List and Plan: 1.  Right facial weakness and right hemiparesis with inability to carry out ADL tasks as well as gait disorder secondary to left basal ganglia stroke.  2.  DVT Prophylaxis/Anticoagulation: Pharmaceutical: Other (comment)-eliquis 3. Pain Management: tylenol prn for pain 4. Mood: Very motivated. LCSW to follow for evaluation and support.  5. Neuropsych: This patient is capable of making decisions on his own behalf. 6. Skin/Wound Care: Routine pressure relief measures.  7. Fluids/Electrolytes/Nutrition: Monitor I/O. Check lytes in am. 8. Paroxymal A fib: Monitor heart rate bid. Monitor for symptoms with increase in activity level. Continue metoprolol 25 mg bid and amiodarone 100 mg daily.  9. Dyslipidemia: Continue lipitor. 10. HTN: monitor BP bid. Continue Norvasc and metoprolol.  11. Chronic systolic  CHF: Off lasix. Question angioedema with ARB therefore not on ACE. Monitor daily weights--heart healthy diet.  Continue metoprolol and statin.  10 IBS-D: Will resume probiotic. Reviewed hospital policy on supplements with patient/family.  Being managed with diet.   Post Admission Physician Evaluation: 1. Functional deficits secondary  to left basal ganglia stroke. 2. Patient is admitted to receive collaborative, interdisciplinary care between the physiatrist, rehab nursing staff, and therapy team. 3. Patient's level of medical complexity and substantial therapy needs in context of that medical necessity cannot be provided at a lesser intensity of care such as a SNF. 4. Patient has experienced substantial functional loss from his/her baseline which was documented above under the "Functional History" and "Functional Status" headings.  Judging by the patient's diagnosis, physical exam, and functional history, the patient has potential for functional progress which will result in measurable gains while on inpatient rehab.  These gains will be of substantial and practical use upon discharge  in facilitating mobility and self-care at the household level. 5. Physiatrist will provide 24 hour management of medical needs as well as oversight of the therapy plan/treatment and provide guidance as appropriate regarding the interaction of the two. 6. 24 hour rehab nursing will assist with bladder management, bowel management, safety, skin/wound care, disease management, medication administration, pain management and patient education and help integrate therapy concepts, techniques, stroke education, etc. 7. PT will assess and treat for/with: Lower extremity strength, range of motion, stamina, balance, functional mobility, safety, adaptive techniques and equipment, coping skills, pain control, education.   Goals are: Mod I/Supervision. 8. OT will assess and treat for/with: ADL's, functional mobility, safety, upper  extremity strength, adaptive techniques and equipment, ego support, and community reintegration.   Goals are: Mod I/Supervision. Therapy may proceed with showering this patient. 9. SLP will assess and treat for/with: speech, higher level cognition.  Goals are: Mod I/Supervision. 10. Case Management and Social Worker will assess and treat for psychological issues and discharge planning. 11. Team conference will be held weekly to assess progress toward goals and to determine barriers to discharge. 12. Patient will receive at least 3 hours of therapy per day at least 5 days per week. 13. ELOS: 10-14 days.  14. Prognosis:  good  Delice Lesch, MD 07/03/2015

## 2015-07-03 NOTE — Care Management Important Message (Signed)
Important Message  Patient Details  Name: CHARYN HAERING MRN: [id number] Date of Birth: 09-13-1940   Medicare Important Message Given:  Yes    Gerson Fauth P Scurry 07/03/2015, 1:28 PM

## 2015-07-03 NOTE — Progress Notes (Signed)
Physical Medicine and Rehabilitation Consult Reason for Consult: Left basal ganglia infarct Referring Physician: [facility]   HPI: Karen Bowman is a 75 y.o. right handed female with history of hypertension, systolic congestive heart failure, atrial fibrillation maintained on Eliquis. Patient lives with spouse independent prior to admission. One level home with 3 steps to entry. Presented 06/28/2015 with right-sided weakness and slurred speech. MRI of the brain showed acute left basal ganglia infarct. MRA of the head with no large vessel occlusion or stenosis. Patient did not receive TPA. Echocardiogram with ejection fraction 60% no wall motion abnormalities. CTA angiogram of head and neck with moderate to severe bilateral proximal PCA stenosis. Neurology follow-up currently remains on Eliquis as well as low-dose aspirin. Tolerating a regular diet. Physical therapy evaluation completed with recommendations of physical medicine rehabilitation consult.   Review of Systems  Constitutional: Negative for fever and chills.  HENT: Negative for hearing loss.  Eyes: Negative for blurred vision and double vision.  Respiratory: Negative for cough and shortness of breath.  Cardiovascular: Positive for palpitations and leg swelling. Negative for chest pain.  Gastrointestinal: Positive for constipation. Negative for nausea and vomiting.  Genitourinary: Negative for dysuria and hematuria.  Musculoskeletal: Positive for myalgias and joint pain.  Skin: Negative for rash.  Neurological: Positive for speech change. Negative for headaches.  Psychiatric/Behavioral:   Anxiety  All other systems reviewed and are negative.  Past Medical History  Diagnosis Date  . Hypertension   . Heart murmur   . Anxiety   . Arrhythmia   . Arthritis   . Atrial fibrillation ([place])   . Acute heart failure ([place])   . Hyperlipemia    Past Surgical History  Procedure Laterality Date  .  Tonsillectomy    . Appendectomy    . Tumor removal      right arm  . Bladder suspension    . Abdominal hysterectomy    . Tee without cardioversion N/A 10/18/2014    Procedure: TRANSESOPHAGEAL ECHOCARDIOGRAM (TEE); Surgeon: Thayer Headings, MD; Location: [facility] ORS; Service: Cardiovascular; Laterality: N/A;  . Electrophysiologic study N/A 10/18/2014    Procedure: CARDIOVERSION; Surgeon: Thayer Headings, MD; Location: [facility] ORS; Service: Cardiovascular; Laterality: N/A;   Family History  Problem Relation Age of Onset  . Osteoporosis Mother   . Heart disease Mother   . Cancer Father   . Diabetes Maternal Aunt    Social History:  reports that she has never smoked. She has never used smokeless tobacco. She reports that she does not drink alcohol or use illicit drugs. Allergies:  Allergies  Allergen Reactions  . Ciprofloxacin     REACTION: dizzy  . Ciprofloxacin Nausea And Vomiting  . Hydrochlorothiazide Nausea And Vomiting  . Hydrochlorothiazide W-Triamterene     REACTION: nausea  . Sulfa Antibiotics Nausea And Vomiting  . Sulfonamide Derivatives     REACTION: nausea \\T \ vomiting  . Valsartan     REACTION: angioedema - probable  . Valsartan Nausea And Vomiting   Medications Prior to Admission  Medication Sig Dispense Refill  . amiodarone (PACERONE) 200 MG tablet Take 1 tablet (200 mg total) by mouth daily. (Patient taking differently: Take 100 mg by mouth daily. ) 90 tablet 3  . amLODipine (NORVASC) 2.5 MG tablet Take 1 tablet (2.5 mg total) by mouth daily. 90 tablet 3  . apixaban (ELIQUIS) 5 MG TABS tablet Take 1 tablet (5 mg total) by mouth 2 (two) times daily. 180 tablet 3  . BEE POLLEN PO Take  2 tablets by mouth daily.     . Cholecalciferol 1000 UNITS capsule Take 2,000 Units by mouth daily.     Marland Kitchen CINNAMON PO Take 2 tablets by mouth daily.       . Coenzyme Q10-Fish Oil-Vit E (CO-Q 10 OMEGA-3 FISH OIL) CAPS Take 1 capsule by mouth 2 (two) times daily.    Marland Kitchen CRANBERRY PO Take 1 tablet by mouth 2 (two) times daily.     Marland Kitchen DANDELION PO Take 1 tablet by mouth 2 (two) times daily.    Marland Kitchen EYEBRIGHT PO Take 1 tablet by mouth daily.    Marland Kitchen Hyaluronic Acid-Vitamin C (HYALURONIC ACID PO) Take 2 tablets by mouth daily.    . L-GLUTAMINE PO Take 1 tablet by mouth 2 (two) times daily.     . metoprolol tartrate (LOPRESSOR) 25 MG tablet Take 0.5 tablets (12.5 mg total) by mouth 2 (two) times daily. 90 tablet 3  . MILK THISTLE PO Take 1 tablet by mouth daily.     Marland Kitchen MISC NATURAL PRODUCTS PO Take 1 tablet by mouth daily. Advanced BP Support-Dr.Sinatra ([place]'s #1 integrated cardiologist)    . MISC NATURAL PRODUCTS PO Take 1 tablet by mouth daily. Multivitamin-Dr. Talmage Coin    . MISC NATURAL PRODUCTS PO Take 4 tablets by mouth daily. [employer]    . MISC NATURAL PRODUCTS PO Take 1 tablet by mouth daily. Ashwagandha    . MISC NATURAL PRODUCTS PO Take 5 tablets by mouth daily. Bone-Up by Gus Height    . MISC NATURAL PRODUCTS PO Advanced Glucosamine Chondroitin-MSM Natures Plus Ultra Rx Joint Liquid 1/2 capful per day    . MISC NATURAL PRODUCTS PO Take 2 tablets by mouth daily. Curcumin    . OVER THE COUNTER MEDICATION Take 2 tablets by mouth daily. Omega Q Plus w/resveratrol-Dr. Talmage Coin    . Probiotic CAPS Take 2 capsules by mouth daily.    . Probiotic Product (PROBIOTIC DAILY PO) Dr. Gertie Exon Probiotics one tablet daily    . QUERCETIN PO Take 1-2 tablets by mouth daily. w/bromelain    . ROYAL JELLY PO Take 1 tablet by mouth daily.    Marland Kitchen RUTIN PO Take 1 capsule by mouth 2 (two) times daily.    . betamethasone dipropionate (DIPROLENE) 0.05 % cream Apply topically 2 (two) times daily. (Patient not taking: Reported on 06/27/2015) 30 g 3  . estradiol  (ESTRACE) 0.1 MG/GM vaginal cream Place 1 Applicatorful vaginally once a week. (Patient not taking: Reported on 06/27/2015) 42.5 g 12    Home: Home Living Family/patient expects to be discharged to:: Private residence Living Arrangements: Spouse/significant other Available Help at Discharge: Family Type of Home: House Home Access: Stairs to enter Technical brewer of Steps: 3 Entrance Stairs-Rails: None Home Layout: One level Bathroom Shower/Tub: Multimedia programmer: Handicapped height Home Equipment: None  Functional History: Prior Function Level of Independence: Independent Comments: Indep with all household/community mobility; + driving; denies fall history Functional Status:  Mobility: Bed Mobility Overal bed mobility: Needs Assistance Bed Mobility: Supine to Sit Supine to sit: Min assist General bed mobility comments: HOB flat, no rail; very effortful and incr time; required assist to move RLE over EOB Transfers Overall transfer level: Needs assistance Equipment used: None Transfers: Sit to/from Stand, [employer], Squat Pivot Transfers Sit to Stand: Mod assist Stand pivot transfers: Mod assist Squat pivot transfers: Mod assist General transfer comment: Stand-pivot x 2 to her Lt with Rt knee buckling and assist for balance and to pivot;  squat-pivot to her Rt (onto [facility])  Ambulation/Gait General Gait Details: unable    ADL:    Cognition: Cognition Overall Cognitive Status: Within Functional Limits for tasks assessed Orientation Level: Oriented X4 Cognition Arousal/Alertness: Awake/alert Behavior During Therapy: Anxious, Impulsive Overall Cognitive Status: Within Functional Limits for tasks assessed  Blood pressure 136/81, pulse 76, temperature 98.3 F (36.8 C), temperature source Oral, resp. rate 16, height 5' 5.5" (1.664 m), weight 57.153 kg (126 lb), SpO2 99 %. Physical Exam  Constitutional: She is oriented to person, place,  and time. She appears well-developed.  HENT:  Right facial droop  Eyes: EOM are normal.  Neck: Normal range of motion. Neck supple. No thyromegaly present.  Cardiovascular:  Cardiac rate control  Respiratory: Breath sounds normal. No respiratory distress.  GI: Soft. Bowel sounds are normal. She exhibits no distension.  Neurological: She is alert and oriented to person, place, and time.  Moderate dysarthria. Fair awareness of deficits  Skin: Skin is warm and dry.  Motor strength is 2 minus at the right deltoid, 3 minus at the biceps triceps and finger flexors as well as finger extensors 3 minus in the right hip flexor and knee extensor and ankle dorsiflexor 5/5 in the left deltoid, biceps, triceps, grip, hip flexor, knee extensor, ankle dorsiflexor and plantar flexor Sensation is intact to light touch in bilateral upper and lower limbs Tone shows no evidence of spasticity deep tendon reflexes are 3+ on the right and 2+ on the left   Lab Results Last 24 Hours    No results found for this or any previous visit (from the past 24 hour(s)).    Imaging Results (Last 48 hours)    Ct Angio Head W/cm &/or Wo Cm  06/28/2015 CLINICAL DATA: Right leg weakness and difficulty walking. Posterior headaches. Acute left basal ganglia infarct on MRI. EXAM: CT ANGIOGRAPHY HEAD AND NECK TECHNIQUE: Multidetector CT imaging of the head and neck was performed using the standard protocol during bolus administration of intravenous contrast. Multiplanar CT image reconstructions and MIPs were obtained to evaluate the vascular anatomy. Carotid stenosis measurements (when applicable) are obtained utilizing NASCET criteria, using the distal internal carotid diameter as the denominator. CONTRAST: 48mL OMNIPAQUE IOHEXOL 350 MG/ML SOLN COMPARISON: Head MRI/ MRA earlier today FINDINGS: CTA NECK Aortic arch: Normal variant aortic arch branching pattern with common origin of the brachiocephalic and left common carotid  arteries. Noncalcified plaque in the proximal left subclavian artery results in less than 50% stenosis. No brachiocephalic or right subclavian artery stenosis. Right carotid system: Patent with mild non stenotic plaque at the carotid bifurcation. Tortuous distal cervical ICA. Left carotid system: Patent with mild non stenotic plaque at the common carotid artery origin and at the carotid bifurcation. Vertebral arteries: The vertebral arteries are patent with the right being dominant. No vertebral artery stenosis is identified. Left V1 assessment is slightly limited by surrounding dense venous contrast. Skeleton: Mild cervical spondylosis. Other neck: Mild enlargement of the right greater than left thyroid lobes with multiple small predominantly hypoattenuating nodules suggestive of multinodular goiter. CTA HEAD Anterior circulation: The internal carotid arteries are patent from skullbase to carotid termini. There is mild carotid siphon atherosclerosis bilaterally, and there is mild narrowing of the anterior cavernous and proximal supraclinoid segments on the right. A small infundibulum is noted at the right posterior communicating artery origin. There is also a 2 mm outpouching in the region of the left posterior communicating artery origin also favored to reflect a small infundibulum. The right  A1 segment is absent. The left A1 segment is widely patent and supplies the right ACA. MCAs are patent without evidence of significant proximal stenosis or major branch vessel occlusion. Mild bilateral MCA branch vessel irregularity suggests atherosclerosis. Posterior circulation: The intracranial vertebral arteries are patent with the right being dominant. The left vertebral artery functionally ends in PICA. PICA and SCA origins are patent. Basilar artery is patent without stenosis. There is a fetal type origin of the right PCA with hypoplastic P1 segment. There is mild narrowing of the mid to distal right posterior  communicating artery, and there is severe stenosis of the proximal P2 segment. There is also a focal, moderate to severe left PCA stenosis involving the distal P1 segment. Venous sinuses: Patent. Anatomic variants: Absent right A1. Delayed phase: Mild edema is noted in corresponding to the acute left basal ganglia infarct, most conspicuous in the posterior putamen. There is no evidence of acute intracranial hemorrhage or abnormal enhancement. IMPRESSION: 1. Moderate to severe bilateral proximal PCA stenoses. 2. Mild right cavernous and supraclinoid ICA stenosis. 3. No cervical carotid or vertebral artery stenosis. Electronically Signed By: Logan Bores M.D. On: 06/28/2015 12:03   Dg Chest 2 View  06/28/2015 CLINICAL DATA: Recent stroke and weakness EXAM: CHEST 2 VIEW COMPARISON: 10/18/2014 FINDINGS: Cardiac shadow is enlarged. Aortic calcifications are again noted. The previously seen effusions have resolved in the interval. No focal infiltrate or sizable effusion is seen. No bony abnormality seen. IMPRESSION: No acute abnormality noted. Electronically Signed By: Inez Catalina M.D. On: 06/28/2015 15:34   Ct Head Wo Contrast  06/27/2015 ADDENDUM REPORT: 06/27/2015 22:28 ADDENDUM: These results were called by telephone at the time of interpretation on 06/27/2015 at 10:28 pm to Dr. Leonel Ramsay, who verbally acknowledged these results. Electronically Signed By: Anner Crete M.D. On: 06/27/2015 22:28  06/27/2015 CLINICAL DATA: 75 year old female with right-sided weakness and slurred speech. Code stroke. EXAM: CT HEAD WITHOUT CONTRAST TECHNIQUE: Contiguous axial images were obtained from the base of the skull through the vertex without intravenous contrast. COMPARISON: None. FINDINGS: There is mild prominence of the ventricles and sulci compatible with age-related atrophy. Minimal periventricular and deep white matter chronic microvascular ischemic changes noted. There is no acute intracranial  hemorrhage. No mass effect or midline shift. A 5 mm focal hyperdense focus along the M1 segment of the left MCA (series 201, image 13) may be artifactual. A left MCA thrombus is not excluded. Correlation with clinical exam recommended. CT angiography may provide additional evaluation. The visualized paranasal sinuses and the mastoid air cells are clear. The calvarium is intact. IMPRESSION: No acute intracranial hemorrhage. Artifact versus less likely a small focal left MCA thrombus. Clinical correlation recommended. CT angiography may provide better evaluation. Electronically Signed: By: Anner Crete M.D. On: 06/27/2015 22:23   Ct Angio Neck W/cm &/or Wo/cm  06/28/2015 CLINICAL DATA: Right leg weakness and difficulty walking. Posterior headaches. Acute left basal ganglia infarct on MRI. EXAM: CT ANGIOGRAPHY HEAD AND NECK TECHNIQUE: Multidetector CT imaging of the head and neck was performed using the standard protocol during bolus administration of intravenous contrast. Multiplanar CT image reconstructions and MIPs were obtained to evaluate the vascular anatomy. Carotid stenosis measurements (when applicable) are obtained utilizing NASCET criteria, using the distal internal carotid diameter as the denominator. CONTRAST: 56mL OMNIPAQUE IOHEXOL 350 MG/ML SOLN COMPARISON: Head MRI/ MRA earlier today FINDINGS: CTA NECK Aortic arch: Normal variant aortic arch branching pattern with common origin of the brachiocephalic and left common carotid arteries. Noncalcified plaque in the proximal  left subclavian artery results in less than 50% stenosis. No brachiocephalic or right subclavian artery stenosis. Right carotid system: Patent with mild non stenotic plaque at the carotid bifurcation. Tortuous distal cervical ICA. Left carotid system: Patent with mild non stenotic plaque at the common carotid artery origin and at the carotid bifurcation. Vertebral arteries: The vertebral arteries are patent with the right being  dominant. No vertebral artery stenosis is identified. Left V1 assessment is slightly limited by surrounding dense venous contrast. Skeleton: Mild cervical spondylosis. Other neck: Mild enlargement of the right greater than left thyroid lobes with multiple small predominantly hypoattenuating nodules suggestive of multinodular goiter. CTA HEAD Anterior circulation: The internal carotid arteries are patent from skullbase to carotid termini. There is mild carotid siphon atherosclerosis bilaterally, and there is mild narrowing of the anterior cavernous and proximal supraclinoid segments on the right. A small infundibulum is noted at the right posterior communicating artery origin. There is also a 2 mm outpouching in the region of the left posterior communicating artery origin also favored to reflect a small infundibulum. The right A1 segment is absent. The left A1 segment is widely patent and supplies the right ACA. MCAs are patent without evidence of significant proximal stenosis or major branch vessel occlusion. Mild bilateral MCA branch vessel irregularity suggests atherosclerosis. Posterior circulation: The intracranial vertebral arteries are patent with the right being dominant. The left vertebral artery functionally ends in PICA. PICA and SCA origins are patent. Basilar artery is patent without stenosis. There is a fetal type origin of the right PCA with hypoplastic P1 segment. There is mild narrowing of the mid to distal right posterior communicating artery, and there is severe stenosis of the proximal P2 segment. There is also a focal, moderate to severe left PCA stenosis involving the distal P1 segment. Venous sinuses: Patent. Anatomic variants: Absent right A1. Delayed phase: Mild edema is noted in corresponding to the acute left basal ganglia infarct, most conspicuous in the posterior putamen. There is no evidence of acute intracranial hemorrhage or abnormal enhancement. IMPRESSION: 1. Moderate to severe  bilateral proximal PCA stenoses. 2. Mild right cavernous and supraclinoid ICA stenosis. 3. No cervical carotid or vertebral artery stenosis. Electronically Signed By: Logan Bores M.D. On: 06/28/2015 12:03   Mr Brain Wo Contrast  06/28/2015 CLINICAL DATA: Right-sided weakness. EXAM: MRI HEAD WITHOUT CONTRAST MRA HEAD WITHOUT CONTRAST TECHNIQUE: Multiplanar, multiecho pulse sequences of the brain and surrounding structures were obtained without intravenous contrast. Angiographic images of the head were obtained using MRA technique without contrast. COMPARISON: Head CT 06/27/2015 FINDINGS: MRI HEAD FINDINGS There is an acute infarct involving the left lateral lenticulostriate territory extending from the posterior body of the caudate into the posterior putamen. There is no evidence of intracranial hemorrhage, mass, midline shift, or extra-axial fluid collection. Small foci of T2 hyperintensity scattered throughout the cerebral white matter bilaterally are nonspecific but compatible with mild chronic small vessel ischemic disease. There is mild generalized cerebral atrophy. Moderate chronic small vessel ischemic change is noted in the pons. Prior bilateral cataract extraction is noted. Tiny mucous retention cysts are noted in the maxillary sinuses. No significant mastoid fluid. Major intracranial vascular flow voids are preserved. MRA HEAD FINDINGS The study is mildly motion degraded. The visualized distal vertebral arteries are patent with the right being strongly dominant. The left vertebral artery ends in PICA. Right PICA origin is patent. SCA origins are patent. Basilar artery is patent without stenosis. There is a fetal type origin of the right PCA. There  is evidence of a severe proximal right P2 stenosis, with assessment mildly limited by motion artifact through this area. There may be mild stenosis near the left P1 - P2 junction. Internal carotid arteries are patent from skullbase to carotid termini  without evidence of significant stenosis. The right A1 segment is absent. The left A1 segment is widely patent and supplies the right ACA. MCAs are patent without evidence of significant proximal stenosis or major branch occlusion. No intracranial aneurysm is identified. IMPRESSION: 1. Acute left basal ganglia infarct. 2. Mild chronic small vessel ischemic disease and cerebral atrophy. 3. No large vessel occlusion or significant proximal anterior circulation stenosis. 4. Severe right P2 PCA stenosis. Electronically Signed By: Logan Bores M.D. On: 06/28/2015 11:15   Mr Jodene Nam Head/brain Wo Cm  06/28/2015 CLINICAL DATA: Right-sided weakness. EXAM: MRI HEAD WITHOUT CONTRAST MRA HEAD WITHOUT CONTRAST TECHNIQUE: Multiplanar, multiecho pulse sequences of the brain and surrounding structures were obtained without intravenous contrast. Angiographic images of the head were obtained using MRA technique without contrast. COMPARISON: Head CT 06/27/2015 FINDINGS: MRI HEAD FINDINGS There is an acute infarct involving the left lateral lenticulostriate territory extending from the posterior body of the caudate into the posterior putamen. There is no evidence of intracranial hemorrhage, mass, midline shift, or extra-axial fluid collection. Small foci of T2 hyperintensity scattered throughout the cerebral white matter bilaterally are nonspecific but compatible with mild chronic small vessel ischemic disease. There is mild generalized cerebral atrophy. Moderate chronic small vessel ischemic change is noted in the pons. Prior bilateral cataract extraction is noted. Tiny mucous retention cysts are noted in the maxillary sinuses. No significant mastoid fluid. Major intracranial vascular flow voids are preserved. MRA HEAD FINDINGS The study is mildly motion degraded. The visualized distal vertebral arteries are patent with the right being strongly dominant. The left vertebral artery ends in PICA. Right PICA origin is patent. SCA  origins are patent. Basilar artery is patent without stenosis. There is a fetal type origin of the right PCA. There is evidence of a severe proximal right P2 stenosis, with assessment mildly limited by motion artifact through this area. There may be mild stenosis near the left P1 - P2 junction. Internal carotid arteries are patent from skullbase to carotid termini without evidence of significant stenosis. The right A1 segment is absent. The left A1 segment is widely patent and supplies the right ACA. MCAs are patent without evidence of significant proximal stenosis or major branch occlusion. No intracranial aneurysm is identified. IMPRESSION: 1. Acute left basal ganglia infarct. 2. Mild chronic small vessel ischemic disease and cerebral atrophy. 3. No large vessel occlusion or significant proximal anterior circulation stenosis. 4. Severe right P2 PCA stenosis. Electronically Signed By: Logan Bores M.D. On: 06/28/2015 11:15     Assessment/Plan: Diagnosis: Right hemiparesis secondary to left basal ganglia infarct, gait disorder, dysarthria 1. Does the need for close, 24 hr/day medical supervision in concert with the patient's rehab needs make it unreasonable for this patient to be served in a less intensive setting? Yes 2. Co-Morbidities requiring supervision/potential complications: Atrial fibrillation, hypertension, congestive heart failure 3. Due to bladder management, bowel management, safety, skin/wound care, disease management, medication administration, pain management and patient education, does the patient require 24 hr/day rehab nursing? Yes 4. Does the patient require coordinated care of a physician, rehab nurse, PT (1-2 hrs/day, 5 days/week), OT (1-2 hrs/day, 5 days/week) and SLP (0.5-1 hrs/day, 5 days/week) to address physical and functional deficits in the context of the above medical diagnosis(es)? Yes  Addressing deficits in the following areas: balance, endurance, locomotion, strength,  transferring, bowel/bladder control, bathing, dressing, feeding, grooming, toileting, cognition, speech and psychosocial support 5. Can the patient actively participate in an intensive therapy program of at least 3 hrs of therapy per day at least 5 days per week? Yes 6. The potential for patient to make measurable gains while on inpatient rehab is excellent 7. Anticipated functional outcomes upon discharge from inpatient rehab are modified independent and supervision with PT, modified independent and supervision with OT, modified independent and supervision with SLP. 8. Estimated rehab length of stay to reach the above functional goals is: 10-14 days 9. Does the patient have adequate social supports and living environment to accommodate these discharge functional goals? Yes 10. Anticipated D/C setting: Home 11. Anticipated post D/C treatments: [place] [facility] 12. Overall Rehab/Functional Prognosis: excellent  RECOMMENDATIONS: This patient's condition is appropriate for continued rehabilitative care in the following setting: CIR Patient has agreed to participate in recommended program. Yes Note that insurance prior authorization may be required for reimbursement for recommended care.  Comment:     06/29/2015       Revision History     Date/Time User Provider Type Action   06/29/2015 4:13 PM Charlett Blake, MD Physician Sign   06/29/2015 12:11 PM Cathlyn Parsons, PA-C Physician Assistant Pend   View Details Report       Routing History     Date/Time From To Method   06/29/2015 4:13 PM Charlett Blake, MD Amy Cletis [place], MD In [facility]

## 2015-07-04 ENCOUNTER — Inpatient Hospital Stay (HOSPITAL_COMMUNITY): Payer: Medicare PPO | Admitting: Speech Pathology

## 2015-07-04 ENCOUNTER — Inpatient Hospital Stay (HOSPITAL_COMMUNITY): Payer: Medicare PPO | Admitting: Physical Therapy

## 2015-07-04 ENCOUNTER — Inpatient Hospital Stay (HOSPITAL_COMMUNITY): Payer: Medicare PPO | Admitting: Occupational Therapy

## 2015-07-04 DIAGNOSIS — I6339 Cerebral infarction due to thrombosis of other cerebral artery: Secondary | ICD-10-CM

## 2015-07-04 LAB — COMPREHENSIVE METABOLIC PANEL
ALBUMIN: 3.2 g/dL — AB (ref 3.5–5.0)
ALK PHOS: 57 U/L (ref 38–126)
ALT: 30 U/L (ref 14–54)
AST: 24 U/L (ref 15–41)
Anion gap: 10 (ref 5–15)
BILIRUBIN TOTAL: 0.8 mg/dL (ref 0.3–1.2)
BUN: 17 mg/dL (ref 6–20)
CALCIUM: 9.2 mg/dL (ref 8.9–10.3)
CO2: 25 mmol/L (ref 22–32)
CREATININE: 0.72 mg/dL (ref 0.44–1.00)
Chloride: 106 mmol/L (ref 101–111)
GFR calc Af Amer: >60 mL/min (ref >60)
GFR calc non Af Amer: >60 mL/min (ref >60)
GLUCOSE: 89 mg/dL (ref 65–99)
POTASSIUM: 3.9 mmol/L (ref 3.5–5.1)
Sodium: 141 mmol/L (ref 135–145)
TOTAL PROTEIN: 6.1 g/dL — AB (ref 6.5–8.1)

## 2015-07-04 LAB — CBC WITH DIFFERENTIAL/PLATELET
BASOS ABS: 0.1 10*3/uL (ref 0.0–0.1)
BASOS PCT: 1 %
Eosinophils Absolute: 0.2 10*3/uL (ref 0.0–0.7)
Eosinophils Relative: 3 %
HEMATOCRIT: 41.2 % (ref 36.0–46.0)
HEMOGLOBIN: 13.4 g/dL (ref 12.0–15.0)
LYMPHS ABS: 2 10*3/uL (ref 0.7–4.0)
LYMPHS PCT: 40 %
MCH: 31 pg (ref 26.0–34.0)
MCHC: 32.5 g/dL (ref 30.0–36.0)
MCV: 95.4 fL (ref 78.0–100.0)
MONOS PCT: 10 %
Monocytes Absolute: 0.5 10*3/uL (ref 0.1–1.0)
NEUTROS ABS: 2.3 10*3/uL (ref 1.7–7.7)
Neutrophils Relative %: 46 %
Platelets: 196 10*3/uL (ref 150–400)
RBC: 4.32 MIL/uL (ref 3.87–5.11)
RDW: 12.6 % (ref 11.5–15.5)
WBC: 5 10*3/uL (ref 4.0–10.5)

## 2015-07-04 MED ORDER — FLUTICASONE PROPIONATE 50 MCG/ACT NA SUSP
1.0000 | Freq: Every day | NASAL | Status: AC
  Administered 2015-07-04 – 2015-07-08 (×5): 1 via NASAL
  Filled 2015-07-04: qty 16

## 2015-07-04 MED ORDER — PRO-STAT SUGAR FREE PO LIQD
30.0000 mL | Freq: Two times a day (BID) | ORAL | Status: DC
  Administered 2015-07-04 – 2015-07-15 (×23): 30 mL via ORAL
  Filled 2015-07-04 (×23): qty 30

## 2015-07-04 NOTE — Progress Notes (Signed)
Physical Therapy Session Note  Patient Details  Name: Karen Bowman MRN: [id number] Date of Birth: 01-Feb-1941  Today's Date: 07/04/2015 PT Individual Time: 1535-1605 PT Individual Time Calculation (min): 30 min   Short Term Goals: Week 1:  PT Short Term Goal 1 (Week 1): Patient will perform bed mobility with supervision. PT Short Term Goal 2 (Week 1): Patient will ambulate 150 ft with min A.  PT Short Term Goal 3 (Week 1): Patient will transfer bed <> wheelchair with min A.  PT Short Term Goal 4 (Week 1): Patient will maintain dynamic standing balance with min A during functional tasks.   Skilled Therapeutic Interventions/Progress Updates:   Session focused on RLE neuro re-ed with wheelchair propulsion in controlled environment using BLE only with supervision and max verbal/visual/tactile cues for R knee extension and heel strike x 100 ft, 5TSS with LUE support and supervision = 33 seconds, and gait training using RW with R hand orthosis to support RUE and prevent subluxation as patient has proximal > distal weakness, 2 x 50 ft with mod A faded to min A and max multimodal cues for R step length and weight acceptance RLE in stance phase. Patient left sitting in recliner with RUE supported on pillows and BLE elevated with all needs in reach.   Therapy Documentation Precautions:  Precautions Precautions: Fall Precaution Comments: RUE hemiparesis greater than LE Restrictions Weight Bearing Restrictions: No Pain: Pain Assessment Pain Assessment: No/denies pain   See Function Navigator for Current Functional Status.   Therapy/Group: Individual Therapy  Laretta Alstrom 07/04/2015, 4:21 PM

## 2015-07-04 NOTE — Evaluation (Signed)
Physical Therapy Assessment and Plan  Patient Details  Name: Karen Bowman MRN: [id number] Date of Birth: 1940/11/21  PT Diagnosis: Abnormal posture, Abnormality of gait, Coordination disorder, Difficulty walking, Edema, Hemiparesis dominant, Hypotonia and Muscle weakness Rehab Potential: Good ELOS: 12-14 days   Today's Date: 07/04/2015 PT Individual Time: 0800-0900 PT Individual Time Calculation (min): 60 min    Problem List:  Patient Active Problem List   Diagnosis Date Noted  . CVA (cerebral infarction) 07/03/2015  . Hemiparesis affecting dominant side as late effect of stroke ([place])   . Gait disturbance, post-stroke   . Paroxysmal atrial fibrillation (HCC)   . Dyslipidemia   . Benign essential HTN   . Chronic systolic congestive heart failure ([place])   . IBS (irritable bowel syndrome)   . Stroke (cerebrum) ([place]) 06/28/2015  . Cerebrovascular accident (CVA) due to occlusion of cerebral artery ([place])   . Stroke ([place]) 06/27/2015  . Counseling regarding end of life decision making 02/02/2015  . Atrophic vaginitis 12/23/2014  . Pelvic pressure in female 12/23/2014  . Urethral caruncle 12/23/2014  . Cystocele, midline 12/23/2014  . Bradycardia 12/12/2014  . Junctional rhythm 12/09/2014  . Chronic systolic heart failure ([place]) 10/31/2014  . A-fib ([place]) 10/14/2014  . Hyponatremia 10/14/2014  . Routine general medical examination at a health care facility 02/01/2014  . Irritable bowel syndrome (IBS) 04/09/2011  . POSTMENOPAUSAL STATUS 02/02/2009  . HYPERLIPIDEMIA 12/26/2006  . HYPERTENSION, BENIGN ESSENTIAL 12/26/2006  . ARTHRITIS, RHEUMATOID 12/26/2006    Past Medical History:  Past Medical History  Diagnosis Date  . Hypertension   . Heart murmur   . Anxiety   . Arrhythmia   . Arthritis   . Atrial fibrillation ([place])   . Acute heart failure ([place])   . Hyperlipemia    Past Surgical History:  Past Surgical History  Procedure Laterality Date  . Tonsillectomy    .  Appendectomy    . Tumor removal      right arm  . Bladder suspension    . Abdominal hysterectomy    . Tee without cardioversion N/A 10/18/2014    Procedure: TRANSESOPHAGEAL ECHOCARDIOGRAM (TEE);  Surgeon: Thayer Headings, MD;  Location: [facility] ORS;  Service: Cardiovascular;  Laterality: N/A;  . Electrophysiologic study N/A 10/18/2014    Procedure: CARDIOVERSION;  Surgeon: Thayer Headings, MD;  Location: [facility] ORS;  Service: Cardiovascular;  Laterality: N/A;    Assessment & Plan Clinical Impression: Karen Bowman is a 75 y.o. right handed female with history of HTN, chronic systolic congestive heart failure, A fib- on Eliquis; who was admitted on 06/28/2015 with right-sided weakness and slurred speech. MRI of the brain showed acute left basal ganglia infarct. MRA of the head was negative for large vessel occlusion or stenosis. Patient did not receive TPA. Echocardiogram with ejection fraction 60% no wall motion abnormalities. CTA angiogram of head and neck showed severe R-P2 PCA stenosis. Dr. [name] felt that stroke likely due to small vessel disease and low dose ASA added for secondary stroke prevention. She is tolerating a regular diet but continues to have mild dysarthria with right facial weakness and right sided weakness affecting ability to carry out ADL tasks and mobility. Patient transferred to CIR on 07/03/2015.   Patient currently requires mod with mobility secondary to muscle weakness and muscle paralysis, decreased cardiorespiratoy endurance, impaired timing and sequencing, abnormal tone and unbalanced muscle activation, decreased attention to right and decreased standing balance, decreased postural control, hemiplegia and decreased balance strategies.  Prior to  hospitalization, patient was independent  with mobility and lived with Spouse in a House home.  Home access is 3Stairs to enter.  Patient will benefit from skilled PT intervention to maximize safe functional mobility, minimize fall risk  and decrease caregiver burden for planned discharge home with 24 hour supervision.  Anticipate patient will benefit from follow up [place] at discharge.  PT - End of Session Activity Tolerance: Decreased this session;Tolerates 30+ min activity with multiple rests Endurance Deficit: Yes Endurance Deficit Description: fatigued quickly, required seated rest breaks PT Assessment Rehab Potential (ACUTE/IP ONLY): Good Barriers to Discharge: Decreased caregiver support PT Patient demonstrates impairments in the following area(s): Balance;Endurance;Edema;Motor;Nutrition;Pain;Perception;Safety PT Transfers Functional Problem(s): Bed Mobility;Bed to Chair;Car;Furniture PT Locomotion Functional Problem(s): Ambulation;Wheelchair Mobility;Stairs PT Plan PT Intensity: Minimum of 1-2 x/day ,45 to 90 minutes PT Frequency: 5 out of 7 days PT Duration Estimated Length of Stay: 12-14 days PT Treatment/Interventions: Ambulation/gait training;Balance/vestibular training;Community reintegration;Discharge planning;Disease management/prevention;DME/adaptive equipment instruction;Functional electrical stimulation;Functional mobility training;Neuromuscular re-education;Pain management;Patient/family education;Psychosocial support;Stair training;Therapeutic Activities;Therapeutic Exercise;UE/LE Strength taining/ROM;UE/LE Coordination activities;Wheelchair propulsion/positioning PT Transfers Anticipated Outcome(s): mod I PT Locomotion Anticipated Outcome(s): supervision PT Recommendation Recommendations for Other Services: Neuropsych consult Follow Up Recommendations: Home health PT;24 hour supervision/assistance Patient destination: Home Equipment Recommended: To be determined  Skilled Therapeutic Intervention Skilled therapeutic intervention initiated after completion of evaluation. Discussed with patient falls risk, safety within room, and focus of therapy during stay. Discussed possible length of stay, goals, and  follow-up therapy. Patient required min-mod A overall for mobility without AD with cues to attend to R side of body and to protect RUE as she sat on it in wheelchair with no awareness. Patient also noted to be somewhat impulsive with mobility. Patient left sitting in wheelchair with needs within reach for OT evaluation.   PT Evaluation Precautions/Restrictions Precautions Precautions: Fall Precaution Comments: RUE hemiparesis greater than LE Restrictions Weight Bearing Restrictions: No General Chart Reviewed: Yes Family/Caregiver Present: No  Pain Pain Assessment Pain Assessment: No/denies pain Home Living/Prior Functioning Home Living Available Help at Discharge: Family Type of Home: House Home Access: Stairs to enter Technical brewer of Steps: 3 Entrance Stairs-Rails: None Home Layout: One level Bathroom Shower/Tub: Multimedia programmer: Handicapped height Bathroom Accessibility: Yes  Lives With: Spouse Prior Function Level of Independence: Independent with basic ADLs;Independent with gait;Independent with transfers;Independent with homemaking with ambulation  Able to Take Stairs?: Yes Driving: Yes Vocation: Retired Leisure: Hobbies-yes (Comment) Comments: enjoys reading Vision/Perception  Perception Comments: Pt at times not managing the RUE when removing gown for shower and after transferring to diffierent surfaces.  Needed max instructional cueing to position it in her lap.    Cognition Arousal/Alertness: Awake/alert Attention: Sustained Sustained Attention: Appears intact Memory: Appears intact Awareness: Appears intact Problem Solving: Appears intact Safety/Judgment: Appears intact Comments: Pt completed ADL without any cognitive deficits but will continue to further assess in function.  Sensation Sensation Light Touch: Appears Intact Proprioception: Impaired Detail Proprioception Impaired Details: Impaired RUE;Impaired RLE Coordination Gross  Motor Movements are Fluid and Coordinated: No Fine Motor Movements are Fluid and Coordinated: No Coordination and Movement Description: RUE > RLE hemiparesis Motor  Motor Motor: Hemiplegia;Abnormal postural alignment and control Motor - Skilled Clinical Observations: RUE and RLE hemiparesis, more impaired in the RUE compared to the RLE.   Mobility Bed Mobility Bed Mobility: Supine to Sit Supine to Sit: 4: Min assist Transfers Transfers: Yes Stand Pivot Transfers: 3: Mod assist Locomotion  Ambulation Ambulation: Yes Ambulation/Gait Assistance: 3: Mod assist;1: +2 Total assist (+2  wheelchair follow) Ambulation Distance (Feet): 75 Feet Assistive device: None Gait Gait: Yes Gait Pattern: Impaired Gait Pattern: Step-to pattern;Step-through pattern;Decreased stance time - left;Decreased stride length;Decreased weight shift to left;Decreased step length - right;Right flexed knee in stance;Lateral hip instability;Decreased trunk rotation;Trunk flexed Gait velocity: decreased Stairs / Additional Locomotion Stairs: Yes Stairs Assistance: 4: Min assist;3: Mod assist Stair Management Technique: One rail Left;Step to pattern;Forwards Number of Stairs: 8 Height of Stairs: 3 Wheelchair Mobility Wheelchair Mobility: No  Trunk/Postural Assessment  Cervical Assessment Cervical Assessment: Within Functional Limits Thoracic Assessment Thoracic Assessment: Exceptions to [facility] (kyphotic) Lumbar Assessment Lumbar Assessment: Exceptions to [facility] (posterior pelvic tilt) Postural Control Postural Control: Deficits on evaluation Protective Responses: delayed/impaired  Balance Balance Balance Assessed: Yes Static Standing Balance Static Standing - Balance Support: During functional activity Static Standing - Level of Assistance: 4: Min assist Extremity Assessment  RUE Assessment RUE Assessment: Exceptions to [facility] RUE Strength RUE Overall Strength Comments: Pt with anterior inferior one finger  subluxation present.  AROM WFLs for all joints.  Brunnstrum stage III in the right arm with less active movement proximal vs distally.  Full gross grasp and release with opposition of the thumb to the index finger.   LUE Assessment LUE Assessment: Within Functional Limits RLE Assessment RLE Assessment: Exceptions to [facility] RLE Strength RLE Overall Strength: Deficits RLE Overall Strength Comments: grossly 3+ to 4/5 throughout except ankle DF/PF 5/5 LLE Assessment LLE Assessment: Within Functional Limits (grossly 4/5 throughout except ankle DF/PF 5/5)   See Function Navigator for Current Functional Status.   Refer to Care Plan for Long Term Goals  Recommendations for other services: Neuropsych  Discharge Criteria: Patient will be discharged from PT if patient refuses treatment 3 consecutive times without medical reason, if treatment goals not met, if there is a change in medical status, if patient makes no progress towards goals or if patient is discharged from hospital.  The above assessment, treatment plan, treatment alternatives and goals were discussed and mutually agreed upon: by patient  Laretta Alstrom 07/04/2015, 10:59 AM

## 2015-07-04 NOTE — Progress Notes (Signed)
Patient information reviewed and entered into eRehab system by Takita Riecke, RN, CRRN, PPS Coordinator.  Information including medical coding and functional independence measure will be reviewed and updated through discharge.     Per nursing patient was given "Data Collection Information Summary for Patients in Inpatient Rehabilitation Facilities with attached "Privacy Act Statement-Health Care Records" upon admission.  

## 2015-07-04 NOTE — Evaluation (Signed)
Speech Language Pathology Assessment and Plan  Patient Details  Name: Karen Bowman MRN: [id number] Date of Birth: 1941/01/03  SLP Diagnosis: Dysarthria;Cognitive Impairments  Rehab Potential: Excellent ELOS: 12-14 days     Today's Date: 07/04/2015 SLP Individual Time: 1305-1400 SLP Individual Time Calculation (min): 55 min   Problem List:  Patient Active Problem List   Diagnosis Date Noted  . CVA (cerebral infarction) 07/03/2015  . Hemiparesis affecting dominant side as late effect of stroke ([place])   . Gait disturbance, post-stroke   . Paroxysmal atrial fibrillation (HCC)   . Dyslipidemia   . Benign essential HTN   . Chronic systolic congestive heart failure ([place])   . IBS (irritable bowel syndrome)   . Stroke (cerebrum) ([place]) 06/28/2015  . Cerebrovascular accident (CVA) due to occlusion of cerebral artery ([place])   . Stroke ([place]) 06/27/2015  . Counseling regarding end of life decision making 02/02/2015  . Atrophic vaginitis 12/23/2014  . Pelvic pressure in female 12/23/2014  . Urethral caruncle 12/23/2014  . Cystocele, midline 12/23/2014  . Bradycardia 12/12/2014  . Junctional rhythm 12/09/2014  . Chronic systolic heart failure ([place]) 10/31/2014  . A-fib ([place]) 10/14/2014  . Hyponatremia 10/14/2014  . Routine general medical examination at a health care facility 02/01/2014  . Irritable bowel syndrome (IBS) 04/09/2011  . POSTMENOPAUSAL STATUS 02/02/2009  . HYPERLIPIDEMIA 12/26/2006  . HYPERTENSION, BENIGN ESSENTIAL 12/26/2006  . ARTHRITIS, RHEUMATOID 12/26/2006   Past Medical History:  Past Medical History  Diagnosis Date  . Hypertension   . Heart murmur   . Anxiety   . Arrhythmia   . Arthritis   . Atrial fibrillation ([place])   . Acute heart failure ([place])   . Hyperlipemia    Past Surgical History:  Past Surgical History  Procedure Laterality Date  . Tonsillectomy    . Appendectomy    . Tumor removal      right arm  . Bladder suspension    . Abdominal  hysterectomy    . Tee without cardioversion N/A 10/18/2014    Procedure: TRANSESOPHAGEAL ECHOCARDIOGRAM (TEE);  Surgeon: Thayer Headings, MD;  Location: [facility] ORS;  Service: Cardiovascular;  Laterality: N/A;  . Electrophysiologic study N/A 10/18/2014    Procedure: CARDIOVERSION;  Surgeon: Thayer Headings, MD;  Location: [facility] ORS;  Service: Cardiovascular;  Laterality: N/A;    Assessment / Plan / Recommendation Clinical Impression Patient is a 75 y.o. right handed female with history of HTN, chronic systolic congestive heart failure, A fib- on Eliquis; who was admitted on 06/28/2015 with right-sided weakness and slurred speech. MRI of the brain showed acute left basal ganglia infarct. CTA angiogram of head and neck showed severe R-P2 PCA stenosis.  Dr. [name] felt that stroke likely due to small vessel disease and low dose ASA added for secondary stroke prevention.  She is tolerating a regular diet but continues to have mild dysarthria with right facial weakness and right sided weakness affecting ability to carry out ADL tasks and mobility. CIR recommended for follow up therapy and patient was admitted for a comprehensive rehabilitation program on 07/03/15. Patient demonstrates mild higher-level cognitive impairments impacting problem solving, awareness, recall and attention to right field of environment/body impacting her ability to complete functional and familiar tasks safely. Patient also demonstrates mild dysarthria characterized by imprecise consonants and an increased rate of speech. However, patient was ~90% intelligible at the conversation level. Patient would benefit from skilled SLP intervention to maximize cognitive function and speech intelligibility in order to maximize her overall  functional independence prior to discharge home.   Skilled Therapeutic Interventions          Administered a cognitive-linguistic evaluation. Please see above for details. Educated the patient in regards to her current  cognitive function and goals of skilled SLP intervention. She verbalized understanding and agreement.   SLP Assessment  Patient will need skilled [place] Pathology Services during CIR admission    Recommendations  Oral Care Recommendations: Oral care BID Recommendations for Other Services: Neuropsych consult Patient destination: Home Follow up Recommendations:  (TBD) Equipment Recommended: None recommended by SLP    SLP Frequency 3 to 5 out of 7 days   SLP Duration  SLP Intensity  SLP Treatment/Interventions 12-14 days   Minumum of 1-2 x/day, 30 to 90 minutes  Cognitive remediation/compensation;Cueing hierarchy;Functional tasks;Patient/family education;Therapeutic Activities;Internal/external aids;Environmental controls;Speech/Language facilitation    Pain Pain Assessment Pain Assessment: No/denies pain  Prior Functioning Type of Home: House  Lives With: Spouse Available Help at Discharge: Family Vocation: Retired  Function:  Cognition Comprehension Comprehension assist level: Follows complex conversation/direction with extra time/assistive device  Expression   Expression assist level: Expresses basic 75 - 89% of the time/requires cueing 10 - 24% of the time. Needs helper to occlude trach/needs to repeat words.  Social Interaction Social Interaction assist level: Interacts appropriately with others - No medications needed.  Problem Solving Problem solving assist level: Solves basic 75 - 89% of the time/requires cueing 10 - 24% of the time  Memory Memory assist level: Recognizes or recalls 75 - 89% of the time/requires cueing 10 - 24% of the time   Short Term Goals: Week 1: SLP Short Term Goal 1 (Week 1): Patient will demonstrate problem solving for mildly complex tasks with supervision verbal cues.  SLP Short Term Goal 2 (Week 1): Patient will recall new, daily information with supervision verbal and visual cues.  SLP Short Term Goal 3 (Week 1): Patient will  attend to right field of enviornment during functional tasks with supervision verbal cues.  SLP Short Term Goal 4 (Week 1): Patient will self-monitor and correct errors during functional tasks with supervision verbal and question cues.  SLP Short Term Goal 5 (Week 1): Patient will utilize speech intelligibility strategies to achieve 100% intelligibility at the conversation level with supervision verbal cues.   Refer to Care Plan for Long Term Goals  Recommendations for other services: Neuropsych  Discharge Criteria: Patient will be discharged from SLP if patient refuses treatment 3 consecutive times without medical reason, if treatment goals not met, if there is a change in medical status, if patient makes no progress towards goals or if patient is discharged from hospital.  The above assessment, treatment plan, treatment alternatives and goals were discussed and mutually agreed upon: by patient  Aisling Emigh 07/04/2015, 3:32 PM

## 2015-07-04 NOTE — Evaluation (Signed)
Occupational Therapy Assessment and Plan  Patient Details  Name: MELESA Bowman MRN: [id number] Date of Birth: 03-15-1941  OT Diagnosis: abnormal posture, hemiplegia affecting dominant side and muscle weakness (generalized) Rehab Potential: Rehab Potential (ACUTE ONLY): Excellent ELOS: 12-14 days   Today's Date: 07/04/2015 OT Individual Time: 2876-8115 OT Individual Time Calculation (min): 63 min     Problem List:  Patient Active Problem List   Diagnosis Date Noted  . CVA (cerebral infarction) 07/03/2015  . Hemiparesis affecting dominant side as late effect of stroke ([place])   . Gait disturbance, post-stroke   . Paroxysmal atrial fibrillation (HCC)   . Dyslipidemia   . Benign essential HTN   . Chronic systolic congestive heart failure ([place])   . IBS (irritable bowel syndrome)   . Stroke (cerebrum) ([place]) 06/28/2015  . Cerebrovascular accident (CVA) due to occlusion of cerebral artery ([place])   . Stroke ([place]) 06/27/2015  . Counseling regarding end of life decision making 02/02/2015  . Atrophic vaginitis 12/23/2014  . Pelvic pressure in female 12/23/2014  . Urethral caruncle 12/23/2014  . Cystocele, midline 12/23/2014  . Bradycardia 12/12/2014  . Junctional rhythm 12/09/2014  . Chronic systolic heart failure ([place]) 10/31/2014  . A-fib ([place]) 10/14/2014  . Hyponatremia 10/14/2014  . Routine general medical examination at a health care facility 02/01/2014  . Irritable bowel syndrome (IBS) 04/09/2011  . POSTMENOPAUSAL STATUS 02/02/2009  . HYPERLIPIDEMIA 12/26/2006  . HYPERTENSION, BENIGN ESSENTIAL 12/26/2006  . ARTHRITIS, RHEUMATOID 12/26/2006    Past Medical History:  Past Medical History  Diagnosis Date  . Hypertension   . Heart murmur   . Anxiety   . Arrhythmia   . Arthritis   . Atrial fibrillation ([place])   . Acute heart failure ([place])   . Hyperlipemia    Past Surgical History:  Past Surgical History  Procedure Laterality Date  . Tonsillectomy    . Appendectomy    .  Tumor removal      right arm  . Bladder suspension    . Abdominal hysterectomy    . Tee without cardioversion N/A 10/18/2014    Procedure: TRANSESOPHAGEAL ECHOCARDIOGRAM (TEE);  Surgeon: Thayer Headings, MD;  Location: [facility] ORS;  Service: Cardiovascular;  Laterality: N/A;  . Electrophysiologic study N/A 10/18/2014    Procedure: CARDIOVERSION;  Surgeon: Thayer Headings, MD;  Location: [facility] ORS;  Service: Cardiovascular;  Laterality: N/A;    Assessment & Plan Clinical Impression: Patient is a 75 y.o. year old female with recent admission to the hospital on 06/28/2015 with right-sided weakness and slurred speech. MRI of the brain showed acute left basal ganglia infarctn   Patient transferred to CIR on 07/03/2015 .    Patient currently requires mod with basic self-care skills and IADL secondary to muscle weakness, impaired timing and sequencing, unbalanced muscle activation and decreased coordination, decreased attention to right and decreased sitting balance, decreased standing balance, decreased postural control, hemiplegia and decreased balance strategies.  Prior to hospitalization, patient could complete ADLs and IADLs with modified independent .  Patient will benefit from skilled intervention to decrease level of assist with basic self-care skills, increase independence with basic self-care skills and increase level of independence with iADL prior to discharge home with care partner.  Anticipate patient will require 24 hour supervision and follow up outpatient.  OT - End of Session Activity Tolerance: Tolerates 30+ min activity with multiple rests Endurance Deficit: Yes Endurance Deficit Description: Pt reporting fatigue throughout session OT Assessment Rehab Potential (ACUTE ONLY): Excellent OT Patient  demonstrates impairments in the following area(s): Balance;Endurance;Motor;Perception;Safety OT Basic ADL's Functional Problem(s): Eating;Grooming;Bathing;Dressing;Toileting OT Advanced ADL's  Functional Problem(s): Simple Meal Preparation;Light Housekeeping OT Transfers Functional Problem(s): Toilet;Tub/Shower OT Additional Impairment(s): Fuctional Use of Upper Extremity OT Plan OT Intensity: Minimum of 1-2 x/day, 45 to 90 minutes OT Frequency: 5 out of 7 days OT Duration/Estimated Length of Stay: 12-14 days OT Treatment/Interventions: Teacher, English as a foreign language;Neuromuscular re-education;Self Care/advanced ADL retraining;UE/LE Strength taining/ROM;UE/LE Coordination activities;Patient/family education;Community reintegration;Discharge planning;Functional mobility training;Psychosocial support;Therapeutic Activities;Functional electrical stimulation;Therapeutic Exercise OT Self Feeding Anticipated Outcome(s): modified independent OT Basic Self-Care Anticipated Outcome(s): supervision to modified independent OT Toileting Anticipated Outcome(s): supervision OT Bathroom Transfers Anticipated Outcome(s): supervision OT Recommendation Patient destination: Home Follow Up Recommendations: Outpatient OT Equipment Recommended: 3 in 1 bedside comode;Tub/shower seat   Skilled Therapeutic Intervention Pt completed shower and dressing during session.  Mod hand held assist for ambulation to the walk-in shower during session with decreased ability to maintain right knee and hip extension during mobility.  Max hand over hand assistance to incorporate RUE functional use during bathing tasks.  She needed min assist for crossing the RLE over the left knee and maintaining during bathing and dressing tasks.  Noted right shoulder scapular depression, downward rotation, and adduction as well.  Provided positioning handout for reference when in bed as well as 1/2 lap tray when up in chair.  Pt demonstrates good awareness of limitations but at times did not position her RUE post transfers and needed min cueing to remove her RUE from the hospital gown prior to shower.  Will  continue to further assess in function.   OT Evaluation Precautions/Restrictions  Precautions Precautions: Fall Precaution Comments: RUE hemiparesis greater than LE Restrictions Weight Bearing Restrictions: No  Pain Pain Assessment Pain Assessment: No/denies pain Home Living/Prior Functioning Home Living Available Help at Discharge: Family Type of Home: House Home Access: Stairs to enter Technical brewer of Steps: 3 Entrance Stairs-Rails: None Home Layout: One level Bathroom Shower/Tub: Multimedia programmer: Handicapped height Bathroom Accessibility: Yes  Lives With: Spouse Prior Function Level of Independence: Independent with basic ADLs, Independent with gait, Independent with transfers, Independent with homemaking with ambulation  Able to Take Stairs?: Yes Driving: Yes Vocation: Retired Leisure: Hobbies-yes (Comment) Comments: enjoys reading ADL  See Function section of chart for detalils Vision/Perception  Vision- History Baseline Vision/History: Wears glasses Wears Glasses: Reading only Patient Visual Report: No change from baseline Vision- Assessment Vision Assessment?: No apparent visual deficits Perception Comments: Pt at times not managing the RUE when removing gown for shower and after transferring to diffierent surfaces.  Needed max instructional cueing to position it in her lap.    Cognition Arousal/Alertness: Awake/alert Orientation Level: Person;Place;Situation Person: Oriented Place: Oriented Situation: Oriented Year: 2017 Month: April Day of Week: Correct Memory: Appears intact Immediate Memory Recall: Sock;Blue;Bed Memory Recall: Sock;Blue;Bed Memory Recall Sock: Without Cue Memory Recall Blue: Without Cue Memory Recall Bed: Without Cue Attention: Sustained Sustained Attention: Appears intact Awareness: Appears intact Problem Solving: Appears intact Safety/Judgment: Appears intact Comments: Pt completed ADL without any  cognitive deficits but will continue to further assess in function.  Sensation Sensation Light Touch: Appears Intact Stereognosis: Appears Intact Proprioception: Appears Intact Additional Comments: Sensation intact in RUE and hand however pt at times leaving it out to the side of the wheelchair or her body.   Coordination Gross Motor Movements are Fluid and Coordinated: No Fine Motor Movements are Fluid and Coordinated: No Coordination and Movement Description: Pt needs max assist to integrate the  RUE into functional tasks.  Noted greater distal movement in the hand and wrist compared to the elbow and shoulder.  Brunnstrum stage III in the arm and stage V in the hand.  Motor  Motor Motor: Hemiplegia;Abnormal postural alignment and control Motor - Skilled Clinical Observations: RUE and RLE hemiparesis, more impaired in the RUE compared to the RLE.  Mobility  Bed Mobility Bed Mobility: Supine to Sit Supine to Sit: 4: Min assist Transfers Transfers: Sit to Stand;Stand to Sit Sit to Stand: 4: Min assist;From toilet;With upper extremity assist Sit to Stand Details: Verbal cues for technique;Manual facilitation for weight shifting Stand to Sit: 4: Min assist;Without upper extremity assist;With upper extremity assist;To toilet;With armrests Stand to Sit Details (indicate cue type and reason): Verbal cues for technique;Manual facilitation for weight shifting  Trunk/Postural Assessment  Cervical Assessment Cervical Assessment: Within Functional Limits Thoracic Assessment Thoracic Assessment: Exceptions to [facility] (kyphotic thoracic rounding) Lumbar Assessment Lumbar Assessment: Exceptions to [facility] (posterior pelvic tilt with increased lumbar flexion) Postural Control Postural Control: Deficits on evaluation (maintains posterior pelvic tilt in sitting)  Balance Balance Balance Assessed: Yes Dynamic Sitting Balance Dynamic Sitting - Balance Support: No upper extremity supported Dynamic Sitting  - Level of Assistance: 5: Stand by assistance Static Standing Balance Static Standing - Balance Support: Right upper extremity supported;Left upper extremity supported;During functional activity Static Standing - Level of Assistance: 4: Min assist Dynamic Standing Balance Dynamic Standing - Balance Support: Right upper extremity supported Dynamic Standing - Level of Assistance: 2: Max assist Extremity/Trunk Assessment RUE Assessment RUE Assessment: Exceptions to [facility] RUE Strength RUE Overall Strength Comments: Pt with anterior inferior one finger subluxation present.  AROM WFLs for all joints.  Brunnstrum stage III in the right arm with less active movement proximal vs distally.  Full gross grasp and release with opposition of the thumb to the index finger.   LUE Assessment LUE Assessment: Within Functional Limits   See Function Navigator for Current Functional Status.   Refer to Care Plan for Long Term Goals  Recommendations for other services: None  Discharge Criteria: Patient will be discharged from OT if patient refuses treatment 3 consecutive times without medical reason, if treatment goals not met, if there is a change in medical status, if patient makes no progress towards goals or if patient is discharged from hospital.  The above assessment, treatment plan, treatment alternatives and goals were discussed and mutually agreed upon: by patient  Lounell Schumacher OTR/L 07/04/2015, 1:19 PM

## 2015-07-04 NOTE — Progress Notes (Signed)
75 y.o. right handed female with history of HTN, chronic systolic congestive heart failure, A fib- on Eliquis; who was admitted on 06/28/2015 with right-sided weakness and slurred speech. MRI of the brain showed acute left basal ganglia infarct. MRA of the head was negative for large vessel occlusion or stenosis. Patient did not receive TPA. Echocardiogram with ejection fraction 60% no wall motion abnormalities. CTA angiogram of head and neck showed severe R-P2 PCA stenosis. Dr. [name] felt that stroke likely due to small vessel disease and low dose ASA added for secondary stroke prevention  Subjective/Complaints: Slept fair, up part of night, no R shoulder pain  ROS- denies N/V/D, urinary problems, was constipated but improved after prune juice  Objective: Vital Signs: Blood pressure 169/70, pulse 72, temperature 98.1 F (36.7 C), temperature source Oral, resp. rate 18, height '5\' 5"'$  (1.651 m), weight 58.968 kg (130 lb), SpO2 95 %. No results found. Results for orders placed or performed during the hospital encounter of 07/03/15 (from the past 72 hour(s))  CBC WITH DIFFERENTIAL     Status: None   Collection Time: 07/04/15  3:53 AM  Result Value Ref Range   WBC 5.0 4.0 - 10.5 K/uL   RBC 4.32 3.87 - 5.11 MIL/uL   Hemoglobin 13.4 12.0 - 15.0 g/dL   HCT 41.2 36.0 - 46.0 %   MCV 95.4 78.0 - 100.0 fL   MCH 31.0 26.0 - 34.0 pg   MCHC 32.5 30.0 - 36.0 g/dL   RDW 12.6 11.5 - 15.5 %   Platelets 196 150 - 400 K/uL   Neutrophils Relative % 46 %   Neutro Abs 2.3 1.7 - 7.7 K/uL   Lymphocytes Relative 40 %   Lymphs Abs 2.0 0.7 - 4.0 K/uL   Monocytes Relative 10 %   Monocytes Absolute 0.5 0.1 - 1.0 K/uL   Eosinophils Relative 3 %   Eosinophils Absolute 0.2 0.0 - 0.7 K/uL   Basophils Relative 1 %   Basophils Absolute 0.1 0.0 - 0.1 K/uL  Comprehensive metabolic panel     Status: Abnormal   Collection Time: 07/04/15  3:53 AM  Result Value Ref Range   Sodium 141 135 - 145 mmol/L   Potassium 3.9 3.5 -  5.1 mmol/L   Chloride 106 101 - 111 mmol/L   CO2 25 22 - 32 mmol/L   Glucose, Bld 89 65 - 99 mg/dL   BUN 17 6 - 20 mg/dL   Creatinine, Ser 0.72 0.44 - 1.00 mg/dL   Calcium 9.2 8.9 - 10.3 mg/dL   Total Protein 6.1 (L) 6.5 - 8.1 g/dL   Albumin 3.2 (L) 3.5 - 5.0 g/dL   AST 24 15 - 41 U/L   ALT 30 14 - 54 U/L   Alkaline Phosphatase 57 38 - 126 U/L   Total Bilirubin 0.8 0.3 - 1.2 mg/dL   GFR calc non Af Amer >60 >60 mL/min   GFR calc Af Amer >60 >60 mL/min    Comment: (NOTE) The eGFR has been calculated using the CKD EPI equation. This calculation has not been validated in all clinical situations. eGFR's persistently <60 mL/min signify possible Chronic Kidney Disease.    Anion gap 10 5 - 15     HEENT: normal Cardio: RRR and no murmur Resp: CTA B/L and unlabored GI: BS positive and NT, ND Extremity:  No Edema Skin:   Intact Neuro: Confused, Normal Sensory, Abnormal Motor 2- Right deltoid, 3- R biceps and triceps and grip, 4-R HF, KE, 3-  ankle DF, Abnormal FMC Ataxic/ dec FMC and Dysarthric Musc/Skel:  Other no pain or swelling in RUE or RLE Gen NAD   Assessment/Plan: 1. Functional deficits secondary to Left basal ganglia lacunar infarct causing R Hemiparesis and dysarthria which require 3+ hours per day of interdisciplinary therapy in a comprehensive inpatient rehab setting. Physiatrist is providing close team supervision and 24 hour management of active medical problems listed below. Physiatrist and rehab team continue to assess barriers to discharge/monitor patient progress toward functional and medical goals. FIM:          Function - Air cabin crew transfer assistive device: Bedside commode Assist level to bedside commode (at bedside): Touching or steadying assistance (Pt > 75%) Assist level from bedside commode (at bedside): Touching or steadying assistance (Pt > 75%)        Function - Comprehension Comprehension: Auditory Comprehension assist level:  Follows complex conversation/direction with extra time/assistive device  Function - Expression Expression: Verbal Expression assist level: Expresses complex ideas: With extra time/assistive device  Function - Social Interaction Social Interaction assist level: Interacts appropriately with others - No medications needed.  Function - Problem Solving Problem solving assist level: Solves complex problems: With extra time  Function - Memory Memory assist level: More than reasonable amount of time Patient normally able to recall (first 3 days only): Current season, Location of own room, Staff names and faces, That he or she is in a hospital   Medical Problem List and Plan: 1.  Right facial weakness and right hemiparesis with inability to carry out ADL tasks as well as gait disorder secondary to left basal ganglia stroke.  Initiate rehab today 2.  DVT Prophylaxis/Anticoagulation: Pharmaceutical: Other (comment)-eliquis 3. Pain Management: tylenol prn for pain 4. Mood: Very motivated. LCSW to follow for evaluation and support.   5. Neuropsych: This patient is capable of making decisions on his own behalf. 6. Skin/Wound Care: Routine pressure relief measures.   7. Fluids/Electrolytes/Nutrition: Monitor I/O. Check lytes in am.Eating 100% meals and >1052m fluids 8. Paroxymal A fib: Monitor heart rate bid. Monitor for symptoms with increase in activity level. Continue metoprolol 25 mg bid and amiodarone 100 mg daily.   9. Dyslipidemia: Continue lipitor. 10. HTN: monitor BP bid. Continue Norvasc and metoprolol.  may need to adjust dose if BP remains elevated 11. Chronic systolic CHF: Off lasix. Question angioedema with ARB therefore not on ACE. Monitor daily weights--heart healthy diet.  Continue metoprolol and statin.   10 IBS-D: Will resume probiotic. Reviewed hospital policy on supplements with patient/family.  Being managed with diet.    LOS (Days) 1 A FACE TO FACE EVALUATION WAS  PERFORMED  KIRSTEINS,ANDREW E 07/04/2015, 7:10 AM

## 2015-07-05 ENCOUNTER — Telehealth: Payer: Self-pay | Admitting: Cardiovascular Disease

## 2015-07-05 ENCOUNTER — Inpatient Hospital Stay (HOSPITAL_COMMUNITY): Payer: Medicare PPO | Admitting: Occupational Therapy

## 2015-07-05 ENCOUNTER — Inpatient Hospital Stay (HOSPITAL_COMMUNITY): Payer: Medicare PPO | Admitting: Physical Therapy

## 2015-07-05 ENCOUNTER — Inpatient Hospital Stay (HOSPITAL_COMMUNITY): Payer: Medicare PPO | Admitting: Speech Pathology

## 2015-07-05 ENCOUNTER — Inpatient Hospital Stay (HOSPITAL_COMMUNITY): Payer: Self-pay

## 2015-07-05 MED ORDER — TIZANIDINE HCL 2 MG PO TABS
2.0000 mg | ORAL_TABLET | Freq: Every day | ORAL | Status: DC
  Administered 2015-07-05 – 2015-07-14 (×10): 2 mg via ORAL
  Filled 2015-07-05 (×10): qty 1

## 2015-07-05 NOTE — Progress Notes (Signed)
Social Work Assessment and Plan  Patient Details  Name: Karen Bowman MRN: [id number] Date of Birth: 12-Oct-1940  Today's Date: 07/04/2015  Problem List:  Patient Active Problem List   Diagnosis Date Noted  . CVA (cerebral infarction) 07/03/2015  . Hemiparesis affecting dominant side as late effect of stroke ([place])   . Gait disturbance, post-stroke   . Paroxysmal atrial fibrillation (HCC)   . Dyslipidemia   . Benign essential HTN   . Chronic systolic congestive heart failure ([place])   . IBS (irritable bowel syndrome)   . Stroke (cerebrum) ([place]) 06/28/2015  . Cerebrovascular accident (CVA) due to occlusion of cerebral artery ([place])   . Stroke ([place]) 06/27/2015  . Counseling regarding end of life decision making 02/02/2015  . Atrophic vaginitis 12/23/2014  . Pelvic pressure in female 12/23/2014  . Urethral caruncle 12/23/2014  . Cystocele, midline 12/23/2014  . Bradycardia 12/12/2014  . Junctional rhythm 12/09/2014  . Chronic systolic heart failure ([place]) 10/31/2014  . A-fib ([place]) 10/14/2014  . Hyponatremia 10/14/2014  . Routine general medical examination at a health care facility 02/01/2014  . Irritable bowel syndrome (IBS) 04/09/2011  . POSTMENOPAUSAL STATUS 02/02/2009  . HYPERLIPIDEMIA 12/26/2006  . HYPERTENSION, BENIGN ESSENTIAL 12/26/2006  . ARTHRITIS, RHEUMATOID 12/26/2006   Past Medical History:  Past Medical History  Diagnosis Date  . Hypertension   . Heart murmur   . Anxiety   . Arrhythmia   . Arthritis   . Atrial fibrillation ([place])   . Acute heart failure ([place])   . Hyperlipemia    Past Surgical History:  Past Surgical History  Procedure Laterality Date  . Tonsillectomy    . Appendectomy    . Tumor removal      right arm  . Bladder suspension    . Abdominal hysterectomy    . Tee without cardioversion N/A 10/18/2014    Procedure: TRANSESOPHAGEAL ECHOCARDIOGRAM (TEE);  Surgeon: Thayer Headings, MD;  Location: [facility] ORS;  Service: Cardiovascular;  Laterality:  N/A;  . Electrophysiologic study N/A 10/18/2014    Procedure: CARDIOVERSION;  Surgeon: Thayer Headings, MD;  Location: [facility] ORS;  Service: Cardiovascular;  Laterality: N/A;   Social History:  reports that she has never smoked. She has never used smokeless tobacco. She reports that she does not drink alcohol or use illicit drugs.  Family / Support Systems Marital Status: Married How Long?: 48 years Patient Roles: Spouse, Parent, Other (Comment) (friend/neighbor) Spouse/Significant Other: Dilara Navarrete - husband - [phone] Children: Chaquana Nichols - son - [phone]; Camillia Marcy - son - [phone] Anticipated Caregiver: spouse and hired assist possible Ability/Limitations of Caregiver: spouse is 44 years old, slow mobility Caregiver Availability: 24/7 Family Dynamics: Supportive family.  Son here from [place] right now to help pt's husband with some things that needed to be done and here to help his mother get settled on Rehab.  Social History Preferred language: English Religion: Unknown Education: high school  Read: Yes Write: Yes Employment Status: Retired Date Retired/Disabled/Unemployed: 1999 Age Retired: 42 Public relations account executive Issues: none reported Guardian/Conservator: N/A - MD feels pt to able to make her own decisions.   Abuse/Neglect Physical Abuse: Denies Verbal Abuse: Denies Sexual Abuse: Denies Exploitation of patient/patient's resources: Denies Self-Neglect: Denies  Emotional Status Pt's affect, behavior and adjustment status: Pt was tearful at times with CSW, but usually in response to feeling fortunate that the stroke wasn't worse than it was and for haivng such a supportive son who came to help her  and her husband.  She admits to being frustrated at times, but overall positive on her recovery from CVA and progress already made. Recent Psychosocial Issues: Pt reports that she and her husband were getting around okay, although pt has noticed since  she was diagnosed with CHF last year, it takes her longer to manage household tasks.  Her arthritis limits her too.   Psychiatric History: none reported Substance Abuse History: none reported  Patient / Family Perceptions, Expectations & Goals Pt/Family understanding of illness & functional limitations: Pt seems to have a good understanding of her condition and recognizes that she could have had a much worse stroke with a more devastating outcome.  Overall, she is grateful for her condition and progress. Premorbid pt/family roles/activities: Pt likes to read, listen to music, and attend concerts when she is able.  She also receives regular mail and stays up to date on conservative political issues and actively responds in the political process.  She also takes care of the house and runs errands for the couple. Anticipated changes in roles/activities/participation: Pt realizes she may need some help with household chores and is open to hiring someone to relieve her and her husband of these tasks.  She hopes to resume her hobbies, although she admits she doesn't have much time for them. Pt/family expectations/goals: Pt wants to be able to do things independently again and to get back use of her right leg and arm.  Community Resources Express Scripts: None Premorbid Home Care/DME Agencies: None Transportation available at discharge: husband Resource referrals recommended: Neuropsychology, Support group (specify)  Discharge Planning Living Arrangements: Spouse/significant other Support Systems: Spouse/significant other, Children, Friends/neighbors Type of Residence: Private residence Administrator, sports: Multimedia programmer (specify) Personnel officer Medicare) Financial Resources: Radio broadcast assistant Screen Referred: No Money Management: Patient, Spouse Does the patient have any problems obtaining your medications?: No Home Management: Pt was doing all of the inside home management, running  errands, meals, etc.  Her husband can help with this and she is considering hiring someone to help, as well. Patient/Family Preliminary Plans: Pt plans to return to her home with her husband to provide 24/7 supervision.  He is able to drive them to where they need to be. Barriers to Discharge: Steps Social Work Anticipated Follow Up Needs: HH/OP, Support Group Expected length of stay: 12-14 days  Clinical Impression CSW met with pt to introduce self and role of CSW, as well as to complete assessment.  Pt was alone in the room, but she expected to see her son and husband later on.  Pt was very open with CSW and shared that she was glad her stroke was not worse and this made her tear up a little.  Talking about everything her son has done for her and her husband also makes her emotional.  Otherwise, she is positive about her progress and motivated to rehab.  Pt's husband is slow moving, but will be able to provide the 24/7 supervision that she will need.  He can also drive to appointments and run errands.  CSW will meet husband and confirm the above with him.  CSW will continue to support pt and assist as needed.  July Linam, Silvestre Mesi 07/04/2015, 1:47 PM

## 2015-07-05 NOTE — Progress Notes (Signed)
Physical Therapy Session Note  Patient Details  Name: Karen Bowman MRN: [id number] Date of Birth: 04/18/1940  Today's Date: 07/05/2015 PT Individual Time: 1107-1208 PT Individual Time Calculation (min): 61 min   Short Term Goals: Week 1:  PT Short Term Goal 1 (Week 1): Patient will perform bed mobility with supervision. PT Short Term Goal 2 (Week 1): Patient will ambulate 150 ft with min A.  PT Short Term Goal 3 (Week 1): Patient will transfer bed <> wheelchair with min A.  PT Short Term Goal 4 (Week 1): Patient will maintain dynamic standing balance with min A during functional tasks.   Skilled Therapeutic Interventions/Progress Updates:   Pt received in recliner; reporting pain in R knee and lack of sleep last night due to RLE mm spasms; reports MD is aware.  Discussed with pt addition of RLE stretching at night time to for tone management.  Pt agreeable.  Performed transfers stand pivot recliner > w/c with mod lifting assistance to stand and for balance when weight shifting and pivoting to R.  Performed w/c mobility with L hemi technique x 100' with supervision and verbal cues for steering.  In gym performed stand pivot to mat with mod A for pivoting to L.  Performed sit > supine on flat mat, rolling to L and R and supine > sit all with min A and verbal cues for attention to RUE and RLE placement and sequencing. While supine on mat provided pt with handout of RLE PROM stretches for tone management and demonstrated each exercise to pt and discussed special considerations.  Pt reporting some back pain and feeling "drunk" lying flat on her back; she reports at home she lies propped up on multiple pillows or on her side due to back pain.  Returned to sitting and applied ACE wrap to R knee for support due to pain in WB.  In hallway performed NMR: see below for details.  Returned to room and pt transferred back to recliner stand pivot with mod A but improved weight shifting and advancement of each LE.   Pt set up in recliner with RUE elevated for edema management and ice on back of R knee and set up for lunch.    Therapy Documentation Precautions:  Precautions Precautions: Fall Precaution Comments: RUE hemiparesis greater than LE Restrictions Weight Bearing Restrictions: No Pain: Pain Assessment Pain Assessment: No/denies pain Pain Score: 3  (intermittent) Pain Location: Knee Pain Orientation: Right Pain Descriptors / Indicators: Sore Pain Intervention(s): Repositioned;Ambulation/increased activity Other Treatments: Treatments Neuromuscular Facilitation: Right;Lower Extremity;Upper Extremity;Activity to increase coordination;Activity to increase motor control;Activity to increase timing and sequencing;Activity to increase sustained activation;Activity to increase lateral weight shifting;Activity to increase anterior-posterior weight shifting in hallway with bilat UE support on wall rail with R and L lateral stepping with focus on full lateral weight shifting, full WB and activation of RLE in stance and full LE advancement to L and R; required mod A overall.     See Function Navigator for Current Functional Status.   Therapy/Group: Individual Therapy  Raylene Everts [facility] 07/05/2015, 12:23 PM

## 2015-07-05 NOTE — Patient Care Conference (Signed)
Inpatient RehabilitationTeam Conference and Plan of Care Update Date: 07/05/2015   Time: 10:35 AM    Patient Name: Karen Bowman      Medical Record Number: [id number]  Date of Birth: [birth year] Sex: Female         Room/Bed: 4M06C/4M06C-01 Payor Info: Payor: [employer] MEDICARE / Plan: [employer] MEDICARE CHOICE PPO / Product Type: *No Product type* /    Admitting Diagnosis: cva  Admit Date/Time:  07/03/2015  4:06 PM Admission Comments: No comment available   Primary Diagnosis:  <principal problem not specified> Principal Problem: <principal problem not specified>  Patient Active Problem List   Diagnosis Date Noted  . CVA (cerebral infarction) 07/03/2015  . Hemiparesis affecting dominant side as late effect of stroke ([place])   . Gait disturbance, post-stroke   . Paroxysmal atrial fibrillation (HCC)   . Dyslipidemia   . Benign essential HTN   . Chronic systolic congestive heart failure ([place])   . IBS (irritable bowel syndrome)   . Stroke (cerebrum) ([place]) 06/28/2015  . Cerebrovascular accident (CVA) due to occlusion of cerebral artery ([place])   . Stroke ([place]) 06/27/2015  . Counseling regarding end of life decision making 02/02/2015  . Atrophic vaginitis 12/23/2014  . Pelvic pressure in female 12/23/2014  . Urethral caruncle 12/23/2014  . Cystocele, midline 12/23/2014  . Bradycardia 12/12/2014  . Junctional rhythm 12/09/2014  . Chronic systolic heart failure ([place]) 10/31/2014  . A-fib ([place]) 10/14/2014  . Hyponatremia 10/14/2014  . Routine general medical examination at a health care facility 02/01/2014  . Irritable bowel syndrome (IBS) 04/09/2011  . POSTMENOPAUSAL STATUS 02/02/2009  . HYPERLIPIDEMIA 12/26/2006  . HYPERTENSION, BENIGN ESSENTIAL 12/26/2006  . ARTHRITIS, RHEUMATOID 12/26/2006    Expected Discharge Date: Expected Discharge Date: 07/15/15  Team Members Present: Physician leading conference: Dr. Alysia Penna Social Worker Present: Alfonse Alpers, LCSW Nurse Present:  Dorien Chihuahua, RN PT Present: Georjean Mode, PT OT Present: Clyda Greener, OT SLP Present: Windell Moulding, SLP PPS Coordinator present : Daiva Nakayama, RN, CRRN     Current Status/Progress Goal Weekly Team Focus  Medical   Right hamstring spasms,  right hemiparesis. No bowel or bladder issues  Maintain medical stability, avoid falls,  Anti-spasticity medication adjustments trial   Bowel/Bladder   cont x2; LBM 4/3  Cont x2 with Min assist  Continue monitoring patient status.    Swallow/Nutrition/ Hydration             ADL's   min to mod assist for bathing and dressing sit to stand.  Mod assist for stand pivot transfers or short distance mobility from wheelchair to the walk-in shower.   She demonstrates Brunnstrum stage III movement in the right arm and stage V movement in the left hand.  Max assist to integrate into selfcare tasks such as bathing or dressing.    supervison overall  selfcare re-training, transfer training, balance re-training, neuromuscular re-education, pt/family education   Mobility   Min-mod A overall  Mod I bed mobility and basic transfers, supervision car transfers, gait and stairs  NMR R side, pain management/spasms, transfers, gait, balance   Communication   Mild dysarthria   mod I   intelligibility in conversations    Safety/Cognition/ Behavioral Observations  mild higher level cognitive deficits   mod I   medication and financial management    Pain   Complaints of pain/spasms to RLE.   < 3 on 0-10 pain scale.   Continue monitoring patient pain.    Skin   Skin intact--bruise  to RLE   Patient's skin remain free from infection/breakdown while on Rehab.   Continue monitoring patient skin qshift.     Rehab Goals Patient on target to meet rehab goals: Yes Rehab Goals Revised: none, as this is pt's first [place] and progress notes for long and short-term goals.  Barriers to Discharge: Limited help at home    Possible Resolutions to Barriers:   Continue rehabilitation maximize functional  mobility    Discharge Planning/Teaching Needs:  Pt will return to her home with her husband to provide 24/7 supervision.  Pt's husband can come for family education as needed.   Team Discussion:  Pt has been having leg spasms at night and told Dr. Letta Pate about it this morning and he started her on Xanaflex at night.  Otherwise, she is doing well medically.  OT reports that pt is doing well at min to mod for ADLs.  Pt has good sensation on her right side, but needs lots of education to protect her right arm.  Pt walked with PT 50' x 2 with min assist and lots of cues.  She's mod assist for txs with overall supervision level goals.  ST has identified that pt has some higher level cognitive difficulties and will work with her on these.  Revisions to Treatment Plan:  None   Continued Need for Acute Rehabilitation Level of Care: The patient requires daily medical management by a physician with specialized training in physical medicine and rehabilitation for the following conditions: Daily direction of a multidisciplinary physical rehabilitation program to ensure safe treatment while eliciting the highest outcome that is of practical value to the patient.: Yes Daily medical management of patient stability for increased activity during participation in an intensive rehabilitation regime.: Yes Daily analysis of laboratory values and/or radiology reports with any subsequent need for medication adjustment of medical intervention for : Mood/behavior problems;Urological problems;Neurological problems  Locke Barrell, Silvestre Mesi 07/05/2015, 1:53 PM

## 2015-07-05 NOTE — Progress Notes (Signed)
Physical Therapy Session Note  Patient Details  Name: Karen Bowman MRN: [id number] Date of Birth: 1940-05-26  Today's Date: 07/05/2015 PT Individual Time: 0831-0859 PT Individual Time Calculation (min): 28 min   Short Term Goals: Week 1:  PT Short Term Goal 1 (Week 1): Patient will perform bed mobility with supervision. PT Short Term Goal 2 (Week 1): Patient will ambulate 150 ft with min A.  PT Short Term Goal 3 (Week 1): Patient will transfer bed <> wheelchair with min A.  PT Short Term Goal 4 (Week 1): Patient will maintain dynamic standing balance with min A during functional tasks.   Skilled Therapeutic Interventions/Progress Updates:    Pt received in recliner & agreeable to PT. Pt reported R hamstring muscle spasms throughout the night that interrupted her sleep, but had informed MD of this. Pt transferred sit<>stand with Min A & ambulated 160 ft + 100 ft + 100 ft with RW, R hand orthotic & min A; pt required seated rest breaks between each gait trial. PT facilitated slightly more R pelvic rotation & provided verbal cuing for ankle dorsiflexion during swing phase on R. Pt able to demonstrate improved R heel strike throughout gait training. Pt demonstrated difficulty with turning by staying in base of RW & advancing R foot in shorter strides to make turn. Pt with decreased weight shifting to R side during ambulation and decreased R quad activation to allow greater advancement of RLE during swing through phase. Pt also noted R LE fatigued with ambulation. At end of session pt left in recliner with all needs within reach.   Therapy Documentation Precautions:  Precautions Precautions: Fall Precaution Comments: RUE hemiparesis greater than LE Restrictions Weight Bearing Restrictions: No  Pain: Pain Assessment Pain Assessment: No/denies pain Pain Score: 3  (intermittent) Pain Location: Knee Pain Orientation: Right Pain Descriptors / Indicators: Sore Pain Intervention(s):  Repositioned;Ambulation/increased activity   See Function Navigator for Current Functional Status.   Therapy/Group: Individual Therapy  Waunita Schooner 07/05/2015, 10:57 AM

## 2015-07-05 NOTE — Progress Notes (Signed)
Social Work Patient ID: Karen Bowman, female   DOB: 1940/12/26, 75 y.o.   MRN: [id number]   CSW met with pt, her husband, and son to update them on team conference discussion and targeted d/c date of 07-15-15.  Pt/family were pleased with that.  Pt did not want to go home too soon, so she feels like having 10 more days would be helpful.  She/they continue to see progress in her.  Discussed pt's goals of supervision.  CSW also went over that CSW would arrange any [facility] therapy and order any needed DME.  Son was relieved by that.  He may go ahead and install a handheld showerhead and grab bar for pt before he leaves for [employer].  CSW will continue to follow and assist as needed.

## 2015-07-05 NOTE — Progress Notes (Signed)
Inpatient Rehabilitation Center Individual Statement of Services  Patient Name:  Karen Bowman  Date:  07/05/2015  Welcome to the [facility].  Our goal is to provide you with an individualized program based on your diagnosis and situation, designed to meet your specific needs.  With this comprehensive rehabilitation program, you will be expected to participate in at least 3 hours of rehabilitation therapies Monday-Friday, with modified therapy programming on the weekends.  Your rehabilitation program will include the following services:  Physical Therapy (PT), Occupational Therapy (OT), Speech Therapy (ST), 24 hour per day rehabilitation nursing, Neuropsychology, Case Management (Social Worker), Rehabilitation Medicine, Nutrition Services and Pharmacy Services  Weekly team conferences will be held on Wednesdays to discuss your progress.  Your Social Worker will talk with you frequently to get your input and to update you on team discussions.  Team conferences with you and your family in attendance may also be held.  Expected length of stay: 12 to 14 days  Overall anticipated outcome:  Supervision  Depending on your progress and recovery, your program may change. Your Social Worker will coordinate services and will keep you informed of any changes. Your Social Worker's name and contact numbers are listed  below.  The following services may also be recommended but are not provided by the [facility] will be made to provide these services after discharge if needed.  Arrangements include referral to agencies that provide these services.  Your insurance has been verified to be:  [employer] Your primary doctor is:  Dr. [name]  Pertinent information will be shared with your doctor and your insurance company.  Social Worker:  Alfonse Alpers, LCSW  [phone] or [phone]  Information discussed with and copy given to patient by: Trey Sailors, 07/05/2015, 9:45 AM

## 2015-07-05 NOTE — Progress Notes (Signed)
75 y.o. right handed female with history of HTN, chronic systolic congestive heart failure, A fib- on Eliquis; who was admitted on 06/28/2015 with right-sided weakness and slurred speech. MRI of the brain showed acute left basal ganglia infarct. MRA of the head was negative for large vessel occlusion or stenosis. Patient did not receive TPA. Echocardiogram with ejection fraction 60% no wall motion abnormalities. CTA angiogram of head and neck showed severe R-P2 PCA stenosis. Dr. [name] felt that stroke likely due to small vessel disease and low dose ASA added for secondary stroke prevention  Subjective/Complaints: Right LE spasms  Last noc for ~2hrs, R knee bend, partially painful ROS- denies N/V/D, urinary problems, was constipated but improved after prune juice  Objective: Vital Signs: Blood pressure 168/55, pulse 63, temperature 97.5 F (36.4 C), temperature source Oral, resp. rate 18, height '5\' 5"'$  (1.651 m), weight 58.6 kg (129 lb 3 oz), SpO2 98 %. No results found. Results for orders placed or performed during the hospital encounter of 07/03/15 (from the past 72 hour(s))  CBC WITH DIFFERENTIAL     Status: None   Collection Time: 07/04/15  3:53 AM  Result Value Ref Range   WBC 5.0 4.0 - 10.5 K/uL   RBC 4.32 3.87 - 5.11 MIL/uL   Hemoglobin 13.4 12.0 - 15.0 g/dL   HCT 41.2 36.0 - 46.0 %   MCV 95.4 78.0 - 100.0 fL   MCH 31.0 26.0 - 34.0 pg   MCHC 32.5 30.0 - 36.0 g/dL   RDW 12.6 11.5 - 15.5 %   Platelets 196 150 - 400 K/uL   Neutrophils Relative % 46 %   Neutro Abs 2.3 1.7 - 7.7 K/uL   Lymphocytes Relative 40 %   Lymphs Abs 2.0 0.7 - 4.0 K/uL   Monocytes Relative 10 %   Monocytes Absolute 0.5 0.1 - 1.0 K/uL   Eosinophils Relative 3 %   Eosinophils Absolute 0.2 0.0 - 0.7 K/uL   Basophils Relative 1 %   Basophils Absolute 0.1 0.0 - 0.1 K/uL  Comprehensive metabolic panel     Status: Abnormal   Collection Time: 07/04/15  3:53 AM  Result Value Ref Range   Sodium 141 135 - 145 mmol/L    Potassium 3.9 3.5 - 5.1 mmol/L   Chloride 106 101 - 111 mmol/L   CO2 25 22 - 32 mmol/L   Glucose, Bld 89 65 - 99 mg/dL   BUN 17 6 - 20 mg/dL   Creatinine, Ser 0.72 0.44 - 1.00 mg/dL   Calcium 9.2 8.9 - 10.3 mg/dL   Total Protein 6.1 (L) 6.5 - 8.1 g/dL   Albumin 3.2 (L) 3.5 - 5.0 g/dL   AST 24 15 - 41 U/L   ALT 30 14 - 54 U/L   Alkaline Phosphatase 57 38 - 126 U/L   Total Bilirubin 0.8 0.3 - 1.2 mg/dL   GFR calc non Af Amer >60 >60 mL/min   GFR calc Af Amer >60 >60 mL/min    Comment: (NOTE) The eGFR has been calculated using the CKD EPI equation. This calculation has not been validated in all clinical situations. eGFR's persistently <60 mL/min signify possible Chronic Kidney Disease.    Anion gap 10 5 - 15     HEENT: normal Cardio: RRR and no murmur Resp: CTA B/L and unlabored GI: BS positive and NT, ND Extremity:  No Edema Skin:   Intact Neuro: Confused, Normal Sensory, Abnormal Motor 2- Right deltoid, 3- R biceps and triceps and grip,  4-R HF, KE, 3- ankle DF, Abnormal FMC Ataxic/ dec FMC and Dysarthric [place] Gr 2 in Right Hamstrings Musc/Skel:  Other no pain or swelling in RUE or RLE Gen NAD   Assessment/Plan: 1. Functional deficits secondary to Left basal ganglia lacunar infarct causing R Hemiparesis and dysarthria which require 3+ hours per day of interdisciplinary therapy in a comprehensive inpatient rehab setting. Physiatrist is providing close team supervision and 24 hour management of active medical problems listed below. Physiatrist and rehab team continue to assess barriers to discharge/monitor patient progress toward functional and medical goals. FIM: Function - Bathing Position: Shower Body parts bathed by patient: Right arm, Chest, Abdomen, Right upper leg, Left upper leg, Left lower leg Body parts bathed by helper: Back, Right lower leg, Buttocks, Front perineal area, Left arm  Function- Upper Body Dressing/Undressing What is the patient wearing?: Bra,  Pull over shirt/dress Bra - Perfomed by patient: Thread/unthread right bra strap, Thread/unthread left bra strap Bra - Perfomed by helper: Hook/unhook bra (pull down sports bra) Pull over shirt/dress - Perfomed by patient: Thread/unthread left sleeve Function - Lower Body Dressing/Undressing What is the patient wearing?: Pants, Underwear, Ted Hose, Shoes Position: Education officer, museum at [employer] - Performed by patient: Thread/unthread left underwear leg Underwear - Performed by helper: Pull underwear up/down, Thread/unthread right underwear leg Pants- Performed by patient: Thread/unthread right pants leg, Thread/unthread left pants leg Pants- Performed by helper: Pull pants up/down Shoes - Performed by patient: Don/doff left shoe Shoes - Performed by helper: Fasten left, Fasten right, Don/doff right shoe TED Hose - Performed by helper: Don/doff right TED hose, Don/doff left TED hose  Function - Toileting Toileting steps completed by patient: Adjust clothing prior to toileting, Performs perineal hygiene, Adjust clothing after toileting Toileting steps completed by helper: Adjust clothing after toileting Toileting Assistive Devices: Grab bar or rail  Function - Air cabin crew transfer assistive device: Elevated toilet seat/BSC over toilet, Grab bar Assist level to toilet: Moderate assist (Pt 50 - 74%/lift or lower) Assist level from toilet: Moderate assist (Pt 50 - 74%/lift or lower) Assist level to bedside commode (at bedside): Moderate assist (Pt 50 - 74%/lift or lower) Assist level from bedside commode (at bedside): Moderate assist (Pt 50 - 74%/lift or lower)  Function - Chair/bed transfer Chair/bed transfer method: Stand pivot Chair/bed transfer assist level: Moderate assist (Pt 50 - 74%/lift or lower) Chair/bed transfer assistive device: Armrests  Function - Locomotion: Wheelchair Type: Manual Max wheelchair distance: 100 Assist Level: Supervision or verbal  cues Assist Level: Supervision or verbal cues Turns around,maneuvers to table,bed, and toilet,negotiates 3% grade,maneuvers on rugs and over doorsills: No Function - Locomotion: Ambulation Assistive device: No device Max distance: 75 ft Assist level: 2 helpers (min-mod A with +2 for wc follow) Assist level: 2 helpers Assist level: 2 helpers Walk 150 feet activity did not occur: Safety/medical concerns Assist level: Maximal assist (Pt 25 - 49%)  Function - Comprehension Comprehension: Auditory Comprehension assist level: Follows complex conversation/direction with extra time/assistive device  Function - Expression Expression: Verbal Expression assist level: Expresses basic 75 - 89% of the time/requires cueing 10 - 24% of the time. Needs helper to occlude trach/needs to repeat words.  Function - Social Interaction Social Interaction assist level: Interacts appropriately with others - No medications needed.  Function - Problem Solving Problem solving assist level: Solves basic 75 - 89% of the time/requires cueing 10 - 24% of the time  Function - Memory Memory assist level: Recognizes or recalls 75 -  89% of the time/requires cueing 10 - 24% of the time Patient normally able to recall (first 3 days only): Current season, Location of own room, Staff names and faces, That he or she is in a hospital   Medical Problem List and Plan: 1.  Right facial weakness and right hemiparesis with inability to carry out ADL tasks as well as gait disorder secondary to left basal ganglia stroke.  Team conference today please see physician documentation under team conference tab, met with team face-to-face to discuss problems,progress, and goals. Formulized individual treatment plan based on medical history, underlying problem and comorbidities. 2.  DVT Prophylaxis/Anticoagulation: Pharmaceutical: Other (comment)-eliquis 3. Pain Management: tylenol prn for pain 4. Mood: Very motivated. LCSW to follow for  evaluation and support.   5. Neuropsych: This patient is capable of making decisions on his own behalf. 6. Skin/Wound Care: Routine pressure relief measures.   7. Fluids/Electrolytes/Nutrition: Monitor I/O. Check lytes in am.Eating 100% meals and >1059m fluids 8. Paroxymal A fib: Monitor heart rate bid. Monitor for symptoms with increase in activity level. Continue metoprolol 25 mg bid and amiodarone 100 mg daily.   9. Dyslipidemia: Continue lipitor. 10. HTN: monitor BP bid. Continue Norvasc and metoprolol.  will increase norvasc to '5mg'$  11. Chronic systolic CHF: Off lasix. Question angioedema with ARB therefore not on ACE. Fluid balance +10844m, no clinical signs of edema weight stable at 58kg--heart healthy diet.  Continue metoprolol and statin.   10 IBS-D: Will resume probiotic. Reviewed hospital policy on supplements with patient/family.  Being managed with diet.  12.  Spasticity- start tizanidine  LOS (Days) 2 A FACE TO FACE EVALUATION WAS PERFORMED  Kenlee Vogt E 07/05/2015, 7:25 AM

## 2015-07-05 NOTE — Progress Notes (Signed)
Occupational Therapy Session Note  Patient Details  Name: Karen Bowman MRN: [id number] Date of Birth: [birth year]  Today's Date: 07/05/2015 OT Individual Time: [id number] Individual Time Calculation (min): 44 min    Short Term Goals: Week 1:  OT Short Term Goal 1 (Week 1): Pt will complete bathing with min assist sit to stand using wash mit PRN.  OT Short Term Goal 2 (Week 1): Pt will complete UB dressing with supervision including sports bra. OT Short Term Goal 3 (Week 1): Pt will perform LB dressing sit to stand with supervision.  OT Short Term Goal 4 (Week 1): Pt will complete toilet transfers with min guard assist using RW for support. OT Short Term Goal 5 (Week 1): Pt will use the RUE as a gross assist for self care tasks with supervision.   Skilled Therapeutic Interventions/Progress Updates:    Pt completed ambulation from the bedside chair to the wheelchair in front of the sink with mod assist hand held.  She was able to complete grooming tasks with supervision with incorporation of the RUE as a gross assist with supervision for holding items when opening.  She needed mod assist to incorporate the RUE as an active assist for washing the LUE as well.  Min assist for sit to stand when pulling up pants with mod assist for donning shoes secondary to not being able to tie them.  Mod instructional cueing with supervision for donning pullover shirt this session as well.  Pt left in bathroom with NT to complete toileting at end of session.    Therapy Documentation Precautions:  Precautions Precautions: Fall Precaution Comments: RUE hemiparesis greater than LE Restrictions Weight Bearing Restrictions: No  Pain: Pain Assessment Pain Assessment: No/denies pain Pain Score: 3  (intermittent) Pain Location: Knee Pain Orientation: Right Pain Descriptors / Indicators: Sore Pain Intervention(s): Repositioned;Ambulation/increased activity ADL: See Function Navigator for Current  Functional Status.   Therapy/Group: Individual Therapy  Darleth Eustache OTR/L 07/05/2015, 12:37 PM

## 2015-07-05 NOTE — Progress Notes (Signed)
Speech Language Pathology Daily Session Note  Patient Details  Name: Karen Bowman MRN: [id number] Date of Birth: [birth year]  Today's Date: 07/05/2015 SLP Individual Time: 1308-1400 SLP Individual Time Calculation (min): 52 min  Short Term Goals: Week 1: SLP Short Term Goal 1 (Week 1): Patient will demonstrate problem solving for mildly complex tasks with supervision verbal cues.  SLP Short Term Goal 2 (Week 1): Patient will recall new, daily information with supervision verbal and visual cues.  SLP Short Term Goal 3 (Week 1): Patient will attend to right field of enviornment during functional tasks with supervision verbal cues.  SLP Short Term Goal 4 (Week 1): Patient will self-monitor and correct errors during functional tasks with supervision verbal and question cues.  SLP Short Term Goal 5 (Week 1): Patient will utilize speech intelligibility strategies to achieve 100% intelligibility at the conversation level with supervision verbal cues.   Skilled Therapeutic Interventions:  Pt was seen for skilled ST targeting cognitive goals.  SLP facilitated the session with a medication management task targeting functional problem solving and recall of new information.  Pt recalled function of medications when named for 60% accuracy, which improved to 90% accuracy with repetition of information via spaced retrieval training.  Pt then loaded pills of varying dosages and frequencies into a BID pill box with overall min assist verbal cues for attention to detail and working memory for 100% accuracy.  Pt with decreased insight into task difficulty but was in agreement with recommendations from SLP that she have assistance for medication management at discharge.  Pt left in recliner with family at bedside.  Continue per current plan of care.   Function:  Eating Eating                 Cognition Comprehension Comprehension assist level: Understands complex 90% of the time/cues 10% of the time   Expression   Expression assist level: Expresses basic 75 - 89% of the time/requires cueing 10 - 24% of the time. Needs helper to occlude trach/needs to repeat words.  Social Interaction Social Interaction assist level: Interacts appropriately with others with medication or extra time (anti-anxiety, antidepressant).  Problem Solving Problem solving assist level: Solves basic 75 - 89% of the time/requires cueing 10 - 24% of the time  Memory Memory assist level: Recognizes or recalls 75 - 89% of the time/requires cueing 10 - 24% of the time    Pain Pain Assessment Pain Assessment: No/denies pain  Therapy/Group: Individual Therapy  Tavone Caesar, Selinda Orion 07/05/2015, 3:42 PM

## 2015-07-05 NOTE — Telephone Encounter (Signed)
Patient in hospital at this time

## 2015-07-05 NOTE — Telephone Encounter (Signed)
Called to schedule fu from recall.  Patient is at [facility] for CVA.  Just fyi per spouse.

## 2015-07-06 ENCOUNTER — Ambulatory Visit: Payer: Self-pay | Admitting: Cardiovascular Disease

## 2015-07-06 ENCOUNTER — Inpatient Hospital Stay (HOSPITAL_COMMUNITY): Payer: Medicare PPO | Admitting: Speech Pathology

## 2015-07-06 ENCOUNTER — Encounter (HOSPITAL_COMMUNITY): Payer: Self-pay

## 2015-07-06 ENCOUNTER — Inpatient Hospital Stay (HOSPITAL_COMMUNITY): Payer: Medicare PPO

## 2015-07-06 ENCOUNTER — Inpatient Hospital Stay (HOSPITAL_COMMUNITY): Payer: Self-pay | Admitting: Occupational Therapy

## 2015-07-06 DIAGNOSIS — I639 Cerebral infarction, unspecified: Secondary | ICD-10-CM

## 2015-07-06 MED ORDER — AMLODIPINE BESYLATE 5 MG PO TABS
5.0000 mg | ORAL_TABLET | Freq: Every day | ORAL | Status: DC
  Administered 2015-07-07 – 2015-07-15 (×9): 5 mg via ORAL
  Filled 2015-07-06 (×9): qty 1

## 2015-07-06 NOTE — Progress Notes (Signed)
Occupational Therapy Session Note  Patient Details  Name: Karen Bowman MRN: [id number] Date of Birth: 1940/12/18  Today's Date: 07/06/2015 OT Individual Time: 0901-1001 OT Individual Time Calculation (min): 60 min    Short Term Goals: Week 1:  OT Short Term Goal 1 (Week 1): Pt will complete bathing with min assist sit to stand using wash mit PRN.  OT Short Term Goal 2 (Week 1): Pt will complete UB dressing with supervision including sports bra. OT Short Term Goal 3 (Week 1): Pt will perform LB dressing sit to stand with supervision.  OT Short Term Goal 4 (Week 1): Pt will complete toilet transfers with min guard assist using RW for support. OT Short Term Goal 5 (Week 1): Pt will use the RUE as a gross assist for self care tasks with supervision.   Skilled Therapeutic Interventions/Progress Updates:    ADL session with OT this am.  Pt ambulated with hand held assist to her dresser to gather clothing prior to shower.  She then ambulated to the walk-in shower with min assist at the same level.  Bathing completed with overall min assist level sit to stand.  She did need mod assist for incorporating the RUE into the task in order to wash her left arm and part of her right leg.  Min assist for crossing and maintaining the RLE over the left knee for washing her foot.  She completed dressing at a mod assist level for donning bra and pullover shirt.  Increased assistance this am secondary to trying a different technique of fastening the bra in the front and then spinning it around.  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  Max assist for tying shoes.  Will attempt shoe buttons next ADL session.  Pt left in wheelchair with NT to finish grooming tasks before transferring back to the bedside recliner.   Therapy Documentation Precautions:  Precautions Precautions: Fall Precaution Comments: RUE hemiparesis greater than LE Restrictions Weight Bearing Restrictions:  No  Pain: Pain Assessment Pain Assessment: No/denies pain ADL: See Function Navigator for Current Functional Status.   Therapy/Group: Individual Therapy  Makenleigh Crownover OTR/L 07/06/2015, 12:44 PM

## 2015-07-06 NOTE — Progress Notes (Signed)
Subjective/Complaints: RLE spasms improved last noc Has hx of sciatica LLE and felt some tingling in L foot yesterday ROS- denies N/V/D, urinary problems,   Objective: Vital Signs: Blood pressure 184/71, pulse 67, temperature 97.8 F (36.6 C), temperature source Oral, resp. rate 16, height 5' 5" (1.651 m), weight 58.4 kg (128 lb 12 oz), SpO2 99 %. No results found. Results for orders placed or performed during the hospital encounter of 07/03/15 (from the past 72 hour(s))  CBC WITH DIFFERENTIAL     Status: None   Collection Time: 07/04/15  3:53 AM  Result Value Ref Range   WBC 5.0 4.0 - 10.5 K/uL   RBC 4.32 3.87 - 5.11 MIL/uL   Hemoglobin 13.4 12.0 - 15.0 g/dL   HCT 41.2 36.0 - 46.0 %   MCV 95.4 78.0 - 100.0 fL   MCH 31.0 26.0 - 34.0 pg   MCHC 32.5 30.0 - 36.0 g/dL   RDW 12.6 11.5 - 15.5 %   Platelets 196 150 - 400 K/uL   Neutrophils Relative % 46 %   Neutro Abs 2.3 1.7 - 7.7 K/uL   Lymphocytes Relative 40 %   Lymphs Abs 2.0 0.7 - 4.0 K/uL   Monocytes Relative 10 %   Monocytes Absolute 0.5 0.1 - 1.0 K/uL   Eosinophils Relative 3 %   Eosinophils Absolute 0.2 0.0 - 0.7 K/uL   Basophils Relative 1 %   Basophils Absolute 0.1 0.0 - 0.1 K/uL  Comprehensive metabolic panel     Status: Abnormal   Collection Time: 07/04/15  3:53 AM  Result Value Ref Range   Sodium 141 135 - 145 mmol/L   Potassium 3.9 3.5 - 5.1 mmol/L   Chloride 106 101 - 111 mmol/L   CO2 25 22 - 32 mmol/L   Glucose, Bld 89 65 - 99 mg/dL   BUN 17 6 - 20 mg/dL   Creatinine, Ser 0.72 0.44 - 1.00 mg/dL   Calcium 9.2 8.9 - 10.3 mg/dL   Total Protein 6.1 (L) 6.5 - 8.1 g/dL   Albumin 3.2 (L) 3.5 - 5.0 g/dL   AST 24 15 - 41 U/L   ALT 30 14 - 54 U/L   Alkaline Phosphatase 57 38 - 126 U/L   Total Bilirubin 0.8 0.3 - 1.2 mg/dL   GFR calc non Af Amer >60 >60 mL/min   GFR calc Af Amer >60 >60 mL/min    Comment: (NOTE) The eGFR has been calculated using the CKD EPI equation. This calculation has not been validated in  all clinical situations. eGFR's persistently <60 mL/min signify possible Chronic Kidney Disease.    Anion gap 10 5 - 15     HEENT: normal Cardio: RRR and no murmur Resp: CTA B/L and unlabored GI: BS positive and NT, ND Extremity:  No Edema Skin:   Intact Neuro: Confused, Normal Sensory, Abnormal Motor 2- Right deltoid, 3- R biceps and triceps and grip, 4-R HF, KE, 3- ankle DF, Abnormal FMC Ataxic/ dec FMC and Dysarthric Ashworth Gr 2 in Right Hamstrings Musc/Skel:  Other no pain or swelling in RUE or RLE Gen NAD   Assessment/Plan: 1. Functional deficits secondary to Left basal ganglia lacunar infarct causing R Hemiparesis and dysarthria which require 3+ hours per day of interdisciplinary therapy in a comprehensive inpatient rehab setting. Physiatrist is providing close team supervision and 24 hour management of active medical problems listed below. Physiatrist and rehab team continue to assess barriers to discharge/monitor patient progress toward functional and medical goals.  FIM: Function - Bathing Position: Shower Body parts bathed by patient: Right arm, Left arm Body parts bathed by helper: Back Bathing not applicable: Chest, Abdomen, Front perineal area, Buttocks, Right upper leg, Left upper leg, Left lower leg, Right lower leg (did not attempt)  Function- Upper Body Dressing/Undressing What is the patient wearing?: Bra, Pull over shirt/dress Bra - Perfomed by patient: Thread/unthread right bra strap, Thread/unthread left bra strap Bra - Perfomed by helper: Hook/unhook bra (pull down sports bra) Pull over shirt/dress - Perfomed by patient: Thread/unthread right sleeve, Thread/unthread left sleeve, Put head through opening, Pull shirt over trunk Function - Lower Body Dressing/Undressing What is the patient wearing?: Pants, Underwear, Maryln Manuel, Shoes Position: Education officer, museum at [employer] - Performed by patient: Thread/unthread left underwear leg Underwear - Performed  by helper: Pull underwear up/down, Thread/unthread right underwear leg Pants- Performed by patient: Thread/unthread right pants leg, Thread/unthread left pants leg Pants- Performed by helper: Pull pants up/down Shoes - Performed by patient: Don/doff left shoe Shoes - Performed by helper: Fasten left, Fasten right, Don/doff right shoe TED Hose - Performed by helper: Don/doff right TED hose, Don/doff left TED hose  Function - Toileting Toileting steps completed by patient: Adjust clothing prior to toileting, Performs perineal hygiene, Adjust clothing after toileting Toileting steps completed by helper: Adjust clothing after toileting Toileting Assistive Devices: Grab bar or rail Assist level: Touching or steadying assistance (Pt.75%)  Function - Toilet Transfers Toilet transfer assistive device: Elevated toilet seat/BSC over toilet, Grab bar Assist level to toilet: Moderate assist (Pt 50 - 74%/lift or lower) Assist level from toilet: Moderate assist (Pt 50 - 74%/lift or lower) Assist level to bedside commode (at bedside): Moderate assist (Pt 50 - 74%/lift or lower) Assist level from bedside commode (at bedside): Moderate assist (Pt 50 - 74%/lift or lower)  Function - Chair/bed transfer Chair/bed transfer method: Stand pivot Chair/bed transfer assist level: Moderate assist (Pt 50 - 74%/lift or lower) Chair/bed transfer assistive device: Armrests  Function - Locomotion: Wheelchair Type: Manual Max wheelchair distance: 100 Assist Level: Supervision or verbal cues Assist Level: Supervision or verbal cues Turns around,maneuvers to table,bed, and toilet,negotiates 3% grade,maneuvers on rugs and over doorsills: No Function - Locomotion: Ambulation Assistive device: Rail in hallway Max distance: 25 Assist level: Moderate assist (Pt 50 - 74%) Assist level: Moderate assist (Pt 50 - 74%) Assist level: Touching or steadying assistance (Pt > 75%) Walk 150 feet activity did not occur:  Safety/medical concerns Assist level: Touching or steadying assistance (Pt > 75%) Assist level: Maximal assist (Pt 25 - 49%)  Function - Comprehension Comprehension: Auditory Comprehension assist level: Follows complex conversation/direction with extra time/assistive device  Function - Expression Expression: Verbal Expression assist level: Expresses basic 75 - 89% of the time/requires cueing 10 - 24% of the time. Needs helper to occlude trach/needs to repeat words.  Function - Social Interaction Social Interaction assist level: Interacts appropriately with others - No medications needed.  Function - Problem Solving Problem solving assist level: Solves basic 75 - 89% of the time/requires cueing 10 - 24% of the time  Function - Memory Memory assist level: Recognizes or recalls 75 - 89% of the time/requires cueing 10 - 24% of the time Patient normally able to recall (first 3 days only): Current season, Location of own room, Staff names and faces, That he or she is in a hospital   Medical Problem List and Plan: 1.  Right facial weakness and right hemiparesis with inability to carry out ADL  tasks as well as gait disorder secondary to left basal ganglia stroke.  Mod A sit to stand 2.  DVT Prophylaxis/Anticoagulation: Pharmaceutical: Other (comment)-eliquis 3. Pain Management: tylenol prn for pain 4. Mood: Very motivated. LCSW to follow for evaluation and support.   5. Neuropsych: This patient is capable of making decisions on his own behalf. 6. Skin/Wound Care: Routine pressure relief measures.   7. Fluids/Electrolytes/Nutrition: Monitor I/O. Check lytes in am.Eating 100% meals and >1066m fluids 8. Paroxymal A fib: Monitor heart rate bid. Monitor for symptoms with increase in activity level. Continue metoprolol 25 mg bid and amiodarone 100 mg daily.   9. Dyslipidemia: Continue lipitor. 10. HTN: monitor BP bid. Continue Norvasc and metoprolol.  norvasc 542mstarted on 4/6, systolic  elevation, pt concerned about ankle swelling 11. Chronic systolic CHF: Off lasix. Question angioedema with ARB therefore not on ACE. Fluid balance +10802m no clinical signs of edema weight stable at 58kg--heart healthy diet.  Continue metoprolol and statin.   10 IBS-D: Will resume probiotic. Reviewed hospital policy on supplements with patient/family.  Being managed with diet.  12.  Spasticity- started tizanidine 2mg48m 4/5  LOS (Days) 3 A FACE TO FACE EVALUATION WAS PERFORMED  KIRSTEINS,ANDREW E 07/06/2015, 7:06 AM

## 2015-07-06 NOTE — Progress Notes (Signed)
Speech Language Pathology Daily Session Note  Patient Details  Name: Karen Bowman MRN: [id number] Date of Birth: 01/22/1941  Today's Date: 07/06/2015 SLP Individual Time: 1300-1400 SLP Individual Time Calculation (min): 60 min  Short Term Goals: Week 1: SLP Short Term Goal 1 (Week 1): Patient will demonstrate problem solving for mildly complex tasks with supervision verbal cues.  SLP Short Term Goal 2 (Week 1): Patient will recall new, daily information with supervision verbal and visual cues.  SLP Short Term Goal 3 (Week 1): Patient will attend to right field of enviornment during functional tasks with supervision verbal cues.  SLP Short Term Goal 4 (Week 1): Patient will self-monitor and correct errors during functional tasks with supervision verbal and question cues.  SLP Short Term Goal 5 (Week 1): Patient will utilize speech intelligibility strategies to achieve 100% intelligibility at the conversation level with supervision verbal cues.   Skilled Therapeutic Interventions:  Pt was seen for skilled ST targeting communication goals.  SLP facilitated the session with a verbal description task targeting intelligibility in conversations.  SLP provided skilled education regarding intelligibility strategies, emphasizing overarticulation, increased vocal intensity, and slow rate.  Pt utilized the abovementioned strategies to achieve intelligibility in conversations with supervision verbal cues.  Pt was returned to room and left in recliner with call bell within reach.  Continue per current plan of care.    Function:  Eating Eating     Eating Assist Level: Set up assist for   Eating Set Up Assist For: Opening containers       Cognition Comprehension Comprehension assist level: Follows complex conversation/direction with extra time/assistive device  Expression   Expression assist level: Expresses basic 75 - 89% of the time/requires cueing 10 - 24% of the time. Needs helper to occlude  trach/needs to repeat words.  Social Interaction Social Interaction assist level: Interacts appropriately with others - No medications needed.  Problem Solving Problem solving assist level: Solves basic 75 - 89% of the time/requires cueing 10 - 24% of the time  Memory Memory assist level: Recognizes or recalls 75 - 89% of the time/requires cueing 10 - 24% of the time    Pain Pain Assessment Pain Assessment: No/denies pain  Therapy/Group: Individual Therapy  Robie Oats, Selinda Orion 07/06/2015, 4:01 PM

## 2015-07-06 NOTE — Progress Notes (Signed)
Occupational Therapy Session Note  Patient Details  Name: STARLEY JEZEWSKI MRN: [id number] Date of Birth: 1940/12/31  Today's Date: 07/06/2015 OT Individual Time: 1500-1530 OT Individual Time Calculation (min): 30 min     Skilled Therapeutic Interventions/Progress Updates:    Took pt to the OT gym for rehab session via wheelchair.  Once in the gym transferred stand pivot with min assist to the therapy mat.  Educated and practiced self AAROM and AROM exercises for the left hand and arm.  Pt was able to return demonstrate with use of handout and min instructional cueing.  She completed 10 repetitions AAROM for shoulder flexion and elbow flexion.  AROM for supination/pronation, wrist flexion/extension, digit flexion/extension, and thumb opposition.  Ten repetitions completed for each exercise.  Pt was able to oppose her thumb to all digits this session which is much better than initial evaluation where she could only oppose to the index finger.  Pt returned to room at end of session. Half lap tray placed on wheelchair with pt instructed to work on washing the table with her RUE and towel underneath her hand.  Call button and phone within reach.    Therapy Documentation Precautions:  Precautions Precautions: Fall Precaution Comments: RUE hemiparesis greater than LE Restrictions Weight Bearing Restrictions: No  Pain: Pain Assessment Pain Assessment: No/denies pain ADL: See Function Navigator for Current Functional Status.   Therapy/Group: Individual Therapy  Jeramyah Goodpasture OTR/L 07/06/2015, 4:07 PM

## 2015-07-06 NOTE — IPOC Note (Signed)
Overall Plan of Care [facility]) Patient Details Name: Karen Bowman MRN: [id number] DOB: 10/26/1940  Admitting Diagnosis: cva  Hospital Problems: Active Problems:   CVA (cerebral infarction)   Hemiparesis affecting dominant side as late effect of stroke (HCC)   Gait disturbance, post-stroke   Paroxysmal atrial fibrillation (HCC)   Dyslipidemia   Benign essential HTN   Chronic systolic congestive heart failure (HCC)   IBS (irritable bowel syndrome)     Functional Problem List: Nursing Endurance, Medication Management, Motor, Pain, Safety, Sensory  PT Balance, Endurance, Edema, Motor, Nutrition, Pain, Perception, Safety  OT Balance, Endurance, Motor, Perception, Safety  SLP Cognition  TR         Basic ADL's: OT Eating, Grooming, Bathing, Dressing, Toileting     Advanced  ADL's: OT Simple Meal Preparation, Light Housekeeping     Transfers: PT Bed Mobility, Bed to Chair, Car, Manufacturing systems engineer, Metallurgist: PT Ambulation, Emergency planning/management officer, Stairs     Additional Impairments: OT Fuctional Use of Upper Extremity  SLP Communication, Social Cognition expression Problem Solving, Memory, Awareness  TR      Anticipated Outcomes Item Anticipated Outcome  Self Feeding modified independent  Swallowing      Basic self-care  supervision to modified independent  Toileting  supervision   Bathroom Transfers supervision  Bowel/Bladder  min assist  Transfers  mod I  Locomotion  supervision  Communication  Mod I  Cognition  Mod I   Pain  3 or less  Safety/Judgment  Min assist   Therapy Plan: PT Intensity: Minimum of 1-2 x/day ,45 to 90 minutes PT Frequency: 5 out of 7 days PT Duration Estimated Length of Stay: 12-14 days OT Intensity: Minimum of 1-2 x/day, 45 to 90 minutes OT Frequency: 5 out of 7 days OT Duration/Estimated Length of Stay: 12-14 days SLP Intensity: Minumum of 1-2 x/day, 30 to 90 minutes SLP Frequency: 3 to 5 out of 7  days SLP Duration/Estimated Length of Stay: 12-14 days        Team Interventions: Nursing Interventions Patient/Family Education, Medication Management, Disease Management/Prevention, Discharge Planning  PT interventions Ambulation/gait training, Training and development officer, Community reintegration, Discharge planning, Disease management/prevention, DME/adaptive equipment instruction, Functional electrical stimulation, Functional mobility training, Neuromuscular re-education, Pain management, Patient/family education, Psychosocial support, Stair training, Therapeutic Activities, Therapeutic Exercise, UE/LE Strength taining/ROM, UE/LE Coordination activities, Wheelchair propulsion/positioning  OT Interventions Training and development officer, DME/adaptive equipment instruction, Neuromuscular re-education, Self Care/advanced ADL retraining, UE/LE Strength taining/ROM, UE/LE Coordination activities, Patient/family education, Community reintegration, Discharge planning, Functional mobility training, Psychosocial support, Therapeutic Activities, Functional electrical stimulation, Therapeutic Exercise  SLP Interventions Cognitive remediation/compensation, Cueing hierarchy, Functional tasks, Patient/family education, Therapeutic Activities, Internal/external aids, Environmental controls, Speech/Language facilitation  TR Interventions    SW/CM Interventions Discharge Planning, Psychosocial Support, Patient/Family Education    Team Discharge Planning: Destination: PT-Home ,OT- Home , SLP-Home Projected Follow-up: PT-Home health PT, 24 hour supervision/assistance, OT-  Outpatient OT, SLP- (TBD) Projected Equipment Needs: PT-To be determined, OT- 3 in 1 bedside comode, Tub/shower seat, SLP-None recommended by SLP Equipment Details: PT- , OT-  Patient/family involved in discharge planning: PT- Patient,  OT-Patient, SLP-Patient  MD ELOS: 10-14d Medical Rehab Prognosis:  Excellent Assessment: 75 y.o. right handed  female with history of HTN, chronic systolic congestive heart failure, A fib- on Eliquis; who was admitted on 06/28/2015 with right-sided weakness and slurred speech. MRI of the brain showed acute left basal ganglia infarct. MRA of the head was negative for large vessel occlusion or  stenosis. Patient did not receive TPA. Echocardiogram with ejection fraction 60% no wall motion abnormalities. CTA angiogram of head and neck showed severe R-P2 PCA stenosis. Dr. [name] felt that stroke likely due to small vessel disease and low dose ASA added for secondary stroke prevention. She is tolerating a regular diet but continues to have mild dysarthria with right facial weakness and right sided weakness affecting ability to carry out ADL tasks and mobility   Now requiring 24/7 Rehab RN,MD, as well as CIR level PT, OT and SLP.  Treatment team will focus on ADLs and mobility As well as speech intelligibility with goals set at Supervision  See Team Conference Notes for weekly updates to the plan of care

## 2015-07-06 NOTE — Consult Note (Signed)
NEUROCOGNITIVE STATUS EXAMINATION - [facility]IDENTIAL [facility]   Mrs. Karen Bowman is a 75 year old woman, who was seen for a neurocognitive status examination to evaluate her emotional state and mental status in the setting of left basal ganglia stroke.    Emotional Functioning:  During the clinical interview, Karen Bowman reported that she is generally coping well.  She acknowledged having moments of depression and became tearful when talking about her son coming to visit, as she has felt overwhelmed by the support he has given her.  She noted that she has not been sleeping well lately, so that has contributed to low energy in general.  However, she said that she remains highly motivated to participate in various therapies and has enjoyed seeing the physical progress that she has already made.  Karen Bowman attributed her ability to stay positive to focusing on her physical successes, remembering that it could have been worse, and recalling how much social support she has.  She said that she will be ready for discharge and views her stroke as a "wake up call" to change her lifestyle.  Her responses to self-report measures of mood symptoms were not suggestive of the presence of clinically significant depression or anxiety at this time.    Mental Status:  Karen Bowman total score on a very brief measure of mental status was not suggestive of the presence of cognitive disruption at the level of dementia (MMSE-2 brief = 14/16).  She lost points for freely recalling only 2 of 3 previously studied words after a brief delay and for misstating the county in which she was being seen.  Subjectively, she noted that she has made paraphasic speech errors and she was observed to have slowed speech.  She denied noticing changes in other cognitive abilities (e.g. memory, attention, etc.).    Impressions and Recommendations:  Karen Bowman total score on a very brief measure of mental status  was not suggestive of the presence of marked cognitive disruption.  However, she was informed that should she notice subtle cognitive difficulties in the future, she could have a comprehensive neuropsychological evaluation as an outpatient.  Contact information on a neuropsychologist in her area should be included in her discharge paperwork for that purpose.  From an emotional standpoint, there was no evidence of clinically significant mood disruption.  Continued follow-up with the neuropsychologist could be provided in the future, should her treatment team feel that it would be beneficial in informing care.    DIAGNOSIS:   CVA  Marlane Hatcher, Psy.D.  Clinical Neuropsychologist

## 2015-07-06 NOTE — Progress Notes (Signed)
Physical Therapy Session Note  Patient Details  Name: Karen Bowman MRN: [id number] Date of Birth: 09-25-1940  Today's Date: 07/06/2015 PT Individual Time: 1100-1205 PT Individual Time Calculation (min): 65 min   Short Term Goals: Week 1:  PT Short Term Goal 1 (Week 1): Patient will perform bed mobility with supervision. PT Short Term Goal 2 (Week 1): Patient will ambulate 150 ft with min A.  PT Short Term Goal 3 (Week 1): Patient will transfer bed <> wheelchair with min A.  PT Short Term Goal 4 (Week 1): Patient will maintain dynamic standing balance with min A during functional tasks.   Skilled Therapeutic Interventions/Progress Updates:   neuromuscular re-education via demo, VCS, forced use for bil LE use to propel w/c, bil knee extension while w/c passively pushed by PT, trunk shortening/lengthening/rotating in sitting with feet supported/unsupported to facilitate head and trunk righting for balance, R hand pincher grasp of small items on table in sitting, sitting astride backless bench for facilitation of anterior pelvic tilt during reaching forward and laterally.  reciprocal scooting and pelvic tilts in sitting for R pelvic activation; mat/recliner >< w/c stand pivot transfers.  Pt tends to move quickly during transfers, with LOB at times.  Gait x 75' with R knee ACe wrap, RW, min guard assist, max cues for R knee ext, wider BOS, upright trunk and forward gaze.  Pt left sitting in recliner with all needs within reach.     Therapy Documentation Precautions:  Precautions Precautions: Fall Precaution Comments: RUE hemiparesis greater than LE Restrictions Weight Bearing Restrictions: No Pain: none noted      See Function Navigator for Current Functional Status.   Therapy/Group: Individual Therapy  Kordel Leavy 07/06/2015, 12:09 PM

## 2015-07-07 ENCOUNTER — Inpatient Hospital Stay (HOSPITAL_COMMUNITY): Payer: Medicare PPO | Admitting: Occupational Therapy

## 2015-07-07 ENCOUNTER — Inpatient Hospital Stay (HOSPITAL_COMMUNITY): Payer: Self-pay | Admitting: Physical Therapy

## 2015-07-07 ENCOUNTER — Inpatient Hospital Stay (HOSPITAL_COMMUNITY): Payer: Medicare PPO | Admitting: Speech Pathology

## 2015-07-07 NOTE — Progress Notes (Signed)
Occupational Therapy Session Note  Patient Details  Name: Karen Bowman MRN: [id number] Date of Birth: 06-30-1940  Today's Date: 07/07/2015 OT Individual Time: 1300-1345 OT Individual Time Calculation (min): 45 min    Short Term Goals: Week 1:  OT Short Term Goal 1 (Week 1): Pt will complete bathing with min assist sit to stand using wash mit PRN.  OT Short Term Goal 2 (Week 1): Pt will complete UB dressing with supervision including sports bra. OT Short Term Goal 3 (Week 1): Pt will perform LB dressing sit to stand with supervision.  OT Short Term Goal 4 (Week 1): Pt will complete toilet transfers with min guard assist using RW for support. OT Short Term Goal 5 (Week 1): Pt will use the RUE as a gross assist for self care tasks with supervision.   Skilled Therapeutic Interventions/Progress Updates:    Treatment session with focus on RUE NMR.  Utilized UE Ranger in sitting with focus on shoulder flexion and protraction and elbow extension/flexion.  Therapist provided support under elbow to minimize effects of gravity progressing to no support.  Tactile cues provided to decrease compensatory movements.  Utilized hula hoop and therapy ball with focus on internal/external rotation with pt demonstrating good grasp on hula hoop with movements, but requiring min cues for positioning when using ball.  Progressed to towel glides on wall with focus on shoulder flexion, utilizing hand over hand to facilitate wrist extension for proper positioning.  Pt returned to bed at end of session with min assist stand pivot and assist to position RLE due to fatigue.  Therapy Documentation Precautions:  Precautions Precautions: Fall Precaution Comments: RUE hemiparesis greater than LE Restrictions Weight Bearing Restrictions: No General:   Vital Signs: Therapy Vitals Temp: 98.1 F (36.7 C) Temp Source: Oral Pulse Rate: 68 Resp: 18 BP: 119/62 mmHg Patient Position (if appropriate): Lying Oxygen  Therapy SpO2: 99 % O2 Device: Not Delivered Pain: Pain Assessment Pain Assessment: No/denies pain  See Function Navigator for Current Functional Status.   Therapy/Group: Individual Therapy  Simonne Come 07/07/2015, 3:23 PM

## 2015-07-07 NOTE — Progress Notes (Signed)
Subjective/Complaints: RLE spasms not an issue overnite , best night yet ROS- denies N/V/D, urinary problems,   Objective: Vital Signs: Blood pressure 126/58, pulse 63, temperature 97.6 F (36.4 C), temperature source Oral, resp. rate 16, height 5\' 5"  (1.651 m), weight 58.1 kg (128 lb 1.4 oz), SpO2 98 %. No results found. No results found for this or any previous visit (from the past 72 hour(s)).   HEENT: normal Cardio: RRR and no murmur Resp: CTA B/L and unlabored GI: BS positive and NT, ND Extremity:  No Edema Skin:   Intact Neuro: Confused, Normal Sensory, Abnormal Motor 2- Right deltoid, 3- R biceps and triceps and grip, 4-R HF, KE, 3- ankle DF, Abnormal FMC Ataxic/ dec FMC and Dysarthric Ash[place] Gr 2 in Right Hamstrings Musc/Skel:  Other no pain or swelling in RUE or RLE Gen NAD   Assessment/Plan: 1. Functional deficits secondary to Left basal ganglia lacunar infarct causing R Hemiparesis and dysarthria which require 3+ hours per day of interdisciplinary therapy in a comprehensive inpatient rehab setting. Physiatrist is providing close team supervision and 24 hour management of active medical problems listed below. Physiatrist and rehab team continue to assess barriers to discharge/monitor patient progress toward functional and medical goals. FIM: Function - Bathing Position: Shower Body parts bathed by patient: Right arm, Chest, Abdomen, Front perineal area, Buttocks, Right upper leg, Left upper leg, Right lower leg, Left lower leg Body parts bathed by helper: Back, Left arm Bathing not applicable: Chest, Abdomen, Front perineal area, Buttocks, Right upper leg, Left upper leg, Left lower leg, Right lower leg (did not attempt) Assist Level:  (6/10, 60%, moderate assist)  Function- Upper Body Dressing/Undressing What is the patient wearing?: Bra, Pull over shirt/dress Bra - Perfomed by patient: Thread/unthread left bra strap Bra - Perfomed by helper: Thread/unthread right  bra strap, Hook/unhook bra (pull down sports bra) Pull over shirt/dress - Perfomed by patient: Thread/unthread right sleeve, Thread/unthread left sleeve Pull over shirt/dress - Perfomed by helper: Put head through opening, Pull shirt over trunk Assist Level: Touching or steadying assistance(Pt > 75%) Function - Lower Body Dressing/Undressing What is the patient wearing?: Pants, Underwear, Maryln Manuel, Shoes Position: Education officer, museum at [employer] - Performed by patient: Thread/unthread left underwear leg, Thread/unthread right underwear leg, Pull underwear up/down Underwear - Performed by helper: Pull underwear up/down, Thread/unthread right underwear leg Pants- Performed by patient: Thread/unthread right pants leg, Thread/unthread left pants leg Pants- Performed by helper: Pull pants up/down Shoes - Performed by patient: Don/doff right shoe, Don/doff left shoe Shoes - Performed by helper: Fasten right, Fasten left TED Hose - Performed by helper: Don/doff right TED hose, Don/doff left TED hose Assist for footwear: Maximal assist Assist for lower body dressing:  (4/8 tasks for FIM, 50%, moderate assist)  Function - Toileting Toileting steps completed by patient: Adjust clothing prior to toileting, Performs perineal hygiene, Adjust clothing after toileting Toileting steps completed by helper: Adjust clothing prior to toileting, Adjust clothing after toileting (per [place] Ingram, NT) Toileting Assistive Devices: Grab bar or rail Assist level: Touching or steadying assistance (Pt.75%)  Function - Air cabin crew transfer assistive device: Elevated toilet seat/BSC over toilet, Grab bar Assist level to toilet: Touching or steadying assistance (Pt > 75%) (per [place] Ingram, NT) Assist level from toilet: Touching or steadying assistance (Pt > 75%) Assist level to bedside commode (at bedside): Moderate assist (Pt 50 - 74%/lift or lower) Assist level from bedside commode (at bedside):  Moderate assist (Pt 50 - 74%/lift or lower)  Function - Chair/bed transfer Chair/bed transfer method: Stand pivot Chair/bed transfer assist level: Moderate assist (Pt 50 - 74%/lift or lower) Chair/bed transfer assistive device: Armrests Chair/bed transfer details: Verbal cues for precautions/safety, Manual facilitation for weight shifting  Function - Locomotion: Wheelchair Will patient use wheelchair at discharge?: Yes Type: Manual Max wheelchair distance: 70 Assist Level: Supervision or verbal cues Assist Level: Supervision or verbal cues Turns around,maneuvers to table,bed, and toilet,negotiates 3% grade,maneuvers on rugs and over doorsills: No Function - Locomotion: Ambulation Assistive device: Walker-rolling, Other (comment) (ACE R knee) Max distance: 75 Assist level: Touching or steadying assistance (Pt > 75%) Assist level: Moderate assist (Pt 50 - 74%) Assist level: Touching or steadying assistance (Pt > 75%) Walk 150 feet activity did not occur: Safety/medical concerns Assist level: Touching or steadying assistance (Pt > 75%) Assist level: Maximal assist (Pt 25 - 49%)  Function - Comprehension Comprehension: Auditory Comprehension assist level: Follows complex conversation/direction with extra time/assistive device  Function - Expression Expression: Verbal Expression assist level: Expresses basic 75 - 89% of the time/requires cueing 10 - 24% of the time. Needs helper to occlude trach/needs to repeat words.  Function - Social Interaction Social Interaction assist level: Interacts appropriately with others - No medications needed.  Function - Problem Solving Problem solving assist level: Solves basic 75 - 89% of the time/requires cueing 10 - 24% of the time  Function - Memory Memory assist level: Recognizes or recalls 75 - 89% of the time/requires cueing 10 - 24% of the time Patient normally able to recall (first 3 days only): Current season, Location of own room, Staff  names and faces, That he or she is in a hospital   Medical Problem List and Plan: 1.  Right facial weakness and right hemiparesis with inability to carry out ADL tasks as well as gait disorder secondary to left basal ganglia stroke.  improving with reaching R hand to mouth, cannot feed self 2.  DVT Prophylaxis/Anticoagulation: Pharmaceutical: Other (comment)-eliquis 3. Pain Management: tylenol prn for pain 4. Mood: Very motivated. LCSW to follow for evaluation and support.   5. Neuropsych: This patient is capable of making decisions on his own behalf. 6. Skin/Wound Care: Routine pressure relief measures.   7. Fluids/Electrolytes/Nutrition: Monitor I/O. Check lytes in am.Eating 100% meals and >103ml fluids 8. Paroxymal A fib: Monitor heart rate bid. Monitor for symptoms with increase in activity level. Continue metoprolol 25 mg bid and amiodarone 100 mg daily.   9. Dyslipidemia: Continue lipitor. 10. HTN: monitor BP bid. Continue Norvasc and metoprolol.  norvasc 5mg  started on 4/6, systolic elevation improved this am 11. Chronic systolic CHF: Off lasix. Question angioedema with ARB therefore not on ACE.  , no clinical signs of edema weight stable at 58kg--heart healthy diet.  Continue metoprolol and statin.   10 IBS-D: Will resume probiotic. Reviewed hospital policy on supplements with patient/family.  Being managed with diet.  12.  Spasticity-improved hamstring spasm on qhs tizanidine  LOS (Days) 4 A FACE TO FACE EVALUATION WAS PERFORMED  Karen Bowman E 07/07/2015, 7:20 AM

## 2015-07-07 NOTE — Progress Notes (Signed)
Physical Therapy Note  Patient Details  Name: Karen Bowman MRN: [id number] Date of Birth: 1940-08-31 Today's Date: 07/07/2015    Time: 740-855 75 minutes  1:1 no c/o pain. Pt performed gait with RW in controlled environment 150' x 3, 100' x 3 with min A. Manual facilitation for Rt hip and glute control in stance phase, wt shifts, anterior wt shift of Rt pelvis.  Pt responds well to verbal cues for R foot clearance during gait.  Pt fatigues easily but able to take standing rest breaks to recover during gait.  Standing forced [place] of R LE to promote improved strength and control in stance phase of gait.  Squat, grasp and reach task without AD with mod/max A for short distance gait and balance, pt uses L UE to assist R UE with grasp and reach task.  Seated NMR through wt bearing on R UE for improved strength and control, improves with repetition.  Gait in home environment with increased cues needed for R foot clearance on carpet.  Pt performed standing balance with donning clothing with min A for sit to stand and standing balance.  Pt fatigues easily but is making improvements in R LE and UE strength and coordination.   Alexandrya Chim 07/07/2015, 8:54 AM

## 2015-07-07 NOTE — Progress Notes (Signed)
Speech Language Pathology Daily Session Note  Patient Details  Name: Karen Bowman MRN: [id number] Date of Birth: 11-06-1940  Today's Date: 07/07/2015 SLP Individual Time: 1005-1100 SLP Individual Time Calculation (min): 55 min  Short Term Goals: Week 1: SLP Short Term Goal 1 (Week 1): Patient will demonstrate problem solving for mildly complex tasks with supervision verbal cues.  SLP Short Term Goal 2 (Week 1): Patient will recall new, daily information with supervision verbal and visual cues.  SLP Short Term Goal 3 (Week 1): Patient will attend to right field of enviornment during functional tasks with supervision verbal cues.  SLP Short Term Goal 4 (Week 1): Patient will self-monitor and correct errors during functional tasks with supervision verbal and question cues.  SLP Short Term Goal 5 (Week 1): Patient will utilize speech intelligibility strategies to achieve 100% intelligibility at the conversation level with supervision verbal cues.   Skilled Therapeutic Interventions:  Pt was seen for skilled ST targeting cognitive goals.  SLP facilitated the session with a semi-complex scheduling task targeting functional problem solving.  Pt required mod assist verbal cues for mental flexibility, working memory, and attention to detail for 100% accuracy during the abovementioned task.  SLP provided skilled education regarding compensatory strategies for memory, including planning ahead, routine, organization, and use of written aids to facilitate recall of daily information.  Pt verbalized understanding of education and was able to report on at least 5 different strategies that she was using prior to admission. Pt was left in recliner with call bell within reach.  Continue per current plan of care.    Function:  Eating Eating                 Cognition Comprehension Comprehension assist level: Follows complex conversation/direction with extra time/assistive device  Expression    Expression assist level: Expresses basic 90% of the time/requires cueing < 10% of the time.  Social Interaction Social Interaction assist level: Interacts appropriately with others - No medications needed.  Problem Solving Problem solving assist level: Solves basic 75 - 89% of the time/requires cueing 10 - 24% of the time  Memory Memory assist level: Recognizes or recalls 75 - 89% of the time/requires cueing 10 - 24% of the time    Pain Pain Assessment Pain Assessment: No/denies pain  Therapy/Group: Individual Therapy  Fumio Vandam, Selinda Orion 07/07/2015, 12:46 PM

## 2015-07-08 ENCOUNTER — Inpatient Hospital Stay (HOSPITAL_COMMUNITY): Payer: Medicare PPO | Admitting: Speech Pathology

## 2015-07-08 ENCOUNTER — Inpatient Hospital Stay (HOSPITAL_COMMUNITY): Payer: Medicare PPO | Admitting: Occupational Therapy

## 2015-07-08 ENCOUNTER — Ambulatory Visit (HOSPITAL_COMMUNITY): Payer: Self-pay | Admitting: Physical Therapy

## 2015-07-08 NOTE — Progress Notes (Signed)
Subjective/Complaints: Patient seen this morning sleeping in bed. She easily arousable. She notes she slept well overnight.  ROS- denies CP, SOB, N/V/D, urinary problems,   Objective: Vital Signs: Blood pressure 108/60, pulse 65, temperature 97.5 F (36.4 C), temperature source Oral, resp. rate 17, height 5\' 5"  (1.651 m), weight 59 kg (130 lb 1.1 oz), SpO2 98 %. No results found. No results found for this or any previous visit (from the past 72 hour(s)).   HEENT: Normocephalic, atraumatic Cardio: RRR and no murmur Resp: CTA B/L and unlabored GI: BS positive and NT, ND Skin:   Intact. Warm and dry. Neuro: Confused Motor 2- Right deltoid, 4 - R biceps and triceps and grip, 4-R HF, KE, 3- ankle DF, Abnormal FMC Ataxic/ dec FMC and Dysarthric Musc/Skel:  No edema. No tenderness. Gen NAD. vital signs reviewed.  Assessment/Plan: 1. Functional deficits secondary to Left basal ganglia lacunar infarct causing R Hemiparesis and dysarthria which require 3+ hours per day of interdisciplinary therapy in a comprehensive inpatient rehab setting. Physiatrist is providing close team supervision and 24 hour management of active medical problems listed below. Physiatrist and rehab team continue to assess barriers to discharge/monitor patient progress toward functional and medical goals. FIM: Function - Bathing Position: Shower Body parts bathed by patient: Right arm, Chest, Abdomen, Front perineal area, Buttocks, Right upper leg, Left upper leg, Right lower leg, Left lower leg Body parts bathed by helper: Back, Left arm Bathing not applicable: Chest, Abdomen, Front perineal area, Buttocks, Right upper leg, Left upper leg, Left lower leg, Right lower leg (did not attempt) Assist Level:  (6/10, 60%, moderate assist)  Function- Upper Body Dressing/Undressing What is the patient wearing?: Bra, Pull over shirt/dress Bra - Perfomed by patient: Thread/unthread left bra strap Bra - Perfomed by helper:  Thread/unthread right bra strap, Hook/unhook bra (pull down sports bra) Pull over shirt/dress - Perfomed by patient: Thread/unthread right sleeve, Thread/unthread left sleeve Pull over shirt/dress - Perfomed by helper: Put head through opening, Pull shirt over trunk Assist Level: Touching or steadying assistance(Pt > 75%) Function - Lower Body Dressing/Undressing What is the patient wearing?: Pants, Underwear, Maryln Manuel, Shoes Position: Education officer, museum at [employer] - Performed by patient: Thread/unthread left underwear leg, Thread/unthread right underwear leg, Pull underwear up/down Underwear - Performed by helper: Pull underwear up/down, Thread/unthread right underwear leg Pants- Performed by patient: Thread/unthread right pants leg, Thread/unthread left pants leg Pants- Performed by helper: Pull pants up/down Shoes - Performed by patient: Don/doff right shoe, Don/doff left shoe Shoes - Performed by helper: Fasten right, Fasten left TED Hose - Performed by helper: Don/doff right TED hose, Don/doff left TED hose Assist for footwear: Maximal assist Assist for lower body dressing:  (4/8 tasks for FIM, 50%, moderate assist)  Function - Toileting Toileting steps completed by patient: Adjust clothing prior to toileting, Performs perineal hygiene, Adjust clothing after toileting Toileting steps completed by helper: Adjust clothing prior to toileting, Adjust clothing after toileting (per [place] Ingram, NT) Toileting Assistive Devices: Grab bar or rail Assist level: Supervision or verbal cues  Function - Air cabin crew transfer assistive device: Elevated toilet seat/BSC over toilet, Grab bar Assist level to toilet: Touching or steadying assistance (Pt > 75%) (per [place] Ingram, NT) Assist level from toilet: Touching or steadying assistance (Pt > 75%) Assist level to bedside commode (at bedside): Moderate assist (Pt 50 - 74%/lift or lower) Assist level from bedside commode (at  bedside): Moderate assist (Pt 50 - 74%/lift or lower)  Function -  Chair/bed transfer Chair/bed transfer method: Stand pivot Chair/bed transfer assist level: Moderate assist (Pt 50 - 74%/lift or lower) Chair/bed transfer assistive device: Armrests Chair/bed transfer details: Verbal cues for precautions/safety, Manual facilitation for weight shifting  Function - Locomotion: Wheelchair Will patient use wheelchair at discharge?: Yes Type: Manual Max wheelchair distance: 70 Assist Level: Supervision or verbal cues Assist Level: Supervision or verbal cues Turns around,maneuvers to table,bed, and toilet,negotiates 3% grade,maneuvers on rugs and over doorsills: No Function - Locomotion: Ambulation Assistive device: Walker-rolling, Other (comment) (ACE R knee) Max distance: 75 Assist level: Touching or steadying assistance (Pt > 75%) Assist level: Moderate assist (Pt 50 - 74%) Assist level: Touching or steadying assistance (Pt > 75%) Walk 150 feet activity did not occur: Safety/medical concerns Assist level: Touching or steadying assistance (Pt > 75%) Assist level: Maximal assist (Pt 25 - 49%)  Function - Comprehension Comprehension: Auditory Comprehension assist level: Follows complex conversation/direction with extra time/assistive device  Function - Expression Expression: Verbal Expression assist level: Expresses basic 75 - 89% of the time/requires cueing 10 - 24% of the time. Needs helper to occlude trach/needs to repeat words.  Function - Social Interaction Social Interaction assist level: Interacts appropriately with others - No medications needed.  Function - Problem Solving Problem solving assist level: Solves basic 75 - 89% of the time/requires cueing 10 - 24% of the time  Function - Memory Memory assist level: Recognizes or recalls 75 - 89% of the time/requires cueing 10 - 24% of the time Patient normally able to recall (first 3 days only): Current season, Location of own  room, Staff names and faces, That he or she is in a hospital   Medical Problem List and Plan: 1.  Right facial weakness and right hemiparesis with inability to carry out ADL tasks as well as gait disorder secondary to left basal ganglia stroke.   Continue CIR   2.  DVT Prophylaxis/Anticoagulation: Pharmaceutical: Other (comment)-eliquis 3. Pain Management: tylenol prn for pain 4. Mood: Very motivated. LCSW to follow for evaluation and support.   5. Neuropsych: This patient is capable of making decisions on his own behalf. 6. Skin/Wound Care: Routine pressure relief measures.   7. Fluids/Electrolytes/Nutrition: Monitor I/O. Eating 100% meals and >1087ml fluids  8. Paroxymal A fib: Monitor heart rate bid. Monitor for symptoms with increase in activity level. Continue metoprolol 25 mg bid and amiodarone 100 mg daily.   9. Dyslipidemia: Continue lipitor. 10. HTN: monitor BP bid. Continue metoprolol.  norvasc 5mg  started on 4/6 11. Chronic systolic CHF: Off lasix. Question angioedema with ARB therefore not on ACE.  , no clinical signs of edema weight stable at 59 KG--heart healthy diet.  Continue metoprolol and statin.   10 IBS-D: Resumed probiotic. Reviewed hospital policy on supplements with patient/family.  Being managed with diet.  12.  Spasticity-improved hamstring spasm on qhs tizanidine  LOS (Days) 5 A FACE TO FACE EVALUATION WAS PERFORMED  Armentha Branagan Lorie Phenix 07/08/2015, 7:40 AM

## 2015-07-08 NOTE — Progress Notes (Signed)
Physical Therapy Session Note  Patient Details  Name: Karen Bowman MRN: [id number] Date of Birth: [birth year]  Today's Date: 07/08/2015 PT Concurrent Time:   1100-1202     62 minutes.   Short Term Goals: Week 1:  PT Short Term Goal 1 (Week 1): Patient will perform bed mobility with supervision. PT Short Term Goal 2 (Week 1): Patient will ambulate 150 ft with min A.  PT Short Term Goal 3 (Week 1): Patient will transfer bed <> wheelchair with min A.  PT Short Term Goal 4 (Week 1): Patient will maintain dynamic standing balance with min A during functional tasks.   Skilled Therapeutic Interventions/Progress Updates:    Patient received sitting in recliner and agreeable to PT. Patient performed stand pivot transfer with RW with min A form PTwith cues to awareness of R UE. PT transported patient to [place] with total A for time management. Nustep training for improved strength and endurance for BUE and BLE. Level 2 for 2 minutes, level 4 for 8 minutes, level 2 for 2 minutes. Gait training for 169ft with min A and RW with cues for improved hip flexion on the R LE for increased foot clearance. Toe taps on 4 inch step with BUE support on RW x 12 each LE. Gait for 47ft x 2.Paitent instructed in stair training with BUE support x 8 with moderate cues for R UE awareness and proper step to gait pattern for descent. Gait to room for 126ft with RW and min A from PT, min tactile cueing for last 20 ft at R knee to improve knee extension in stance and prevent buckle. Throughout PT therapy, patient performed Stand pivot transfer with RW and min A from PT x 8 to various surfaces heights. Patient left in recliner with call bell within reach.   Therapy Documentation Precautions:  Precautions Precautions: Fall Precaution Comments: RUE hemiparesis greater than LE Restrictions Weight Bearing Restrictions: No General:   Vital Signs: Therapy Vitals Temp: 98.3 F (36.8 C) Temp Source: Oral Pulse Rate:  69 Resp: 18 BP: (!) 117/47 mmHg Patient Position (if appropriate): Sitting Oxygen Therapy SpO2: 98 % O2 Device: Not Delivered Pain: Pain Assessment Pain Assessment: No/denies pain   See Function Navigator for Current Functional Status.   Therapy/Group: Individual Therapy  Lorie Phenix 07/08/2015, 5:19 PM

## 2015-07-08 NOTE — Progress Notes (Signed)
Speech Language Pathology Daily Session Note  Patient Details  Name: Karen Bowman MRN: [id number] Date of Birth: Sep 09, 1940  Today's Date: 07/08/2015 SLP Individual Time: 1450-1530 SLP Individual Time Calculation (min): 40 min  Short Term Goals: Week 1: SLP Short Term Goal 1 (Week 1): Patient will demonstrate problem solving for mildly complex tasks with supervision verbal cues.  SLP Short Term Goal 2 (Week 1): Patient will recall new, daily information with supervision verbal and visual cues.  SLP Short Term Goal 3 (Week 1): Patient will attend to right field of enviornment during functional tasks with supervision verbal cues.  SLP Short Term Goal 4 (Week 1): Patient will self-monitor and correct errors during functional tasks with supervision verbal and question cues.  SLP Short Term Goal 5 (Week 1): Patient will utilize speech intelligibility strategies to achieve 100% intelligibility at the conversation level with supervision verbal cues.   Skilled Therapeutic Interventions:  Pt was seen for skilled ST targeting goals for communication.  SLP facilitated the session with a verbal description task in the presence of background noise with the use of a barrier between therapist and pt to eliminate visual feedback to facilitate improved awareness and self regulation of intelligibility in conversations.  Pt required overall min-mod assist verbal cues for intelligibility during the abovementioned task and verbalized improved awareness into task difficulty in comparison to previous therapy session.  SLP discussed environmental modifications which, in combination with intelligibility strategies, could facilitate pt's improved communication across a variety of contexts and situations.  Pt verbalized understanding of education but SLP will continue to reinforce strategies on an ongoing basis.  Pt was left in recliner with call bell within reach.  Continue per current plan of care.     Function:  Eating Eating                 Cognition Comprehension Comprehension assist level: Follows complex conversation/direction with extra time/assistive device  Expression   Expression assist level: Expresses basic 75 - 89% of the time/requires cueing 10 - 24% of the time. Needs helper to occlude trach/needs to repeat words.  Social Interaction Social Interaction assist level: Interacts appropriately with others - No medications needed.  Problem Solving Problem solving assist level: Solves basic 75 - 89% of the time/requires cueing 10 - 24% of the time  Memory Memory assist level: Recognizes or recalls 75 - 89% of the time/requires cueing 10 - 24% of the time    Pain Pain Assessment Pain Assessment: No/denies pain  Therapy/Group: Individual Therapy  Aamiyah Derrick, Selinda Orion 07/08/2015, 3:59 PM

## 2015-07-08 NOTE — Progress Notes (Signed)
Occupational Therapy Session Note  Patient Details  Name: Karen Bowman MRN: [id number] Date of Birth: 1940-10-12  Today's Date: 07/08/2015 OT Individual Time:  -    0800-0900  (60 min)       Short Term Goals: Week 1:  OT Short Term Goal 1 (Week 1): Pt will complete bathing with min assist sit to stand using wash mit PRN.  OT Short Term Goal 2 (Week 1): Pt will complete UB dressing with supervision including sports bra. OT Short Term Goal 3 (Week 1): Pt will perform LB dressing sit to stand with supervision.  OT Short Term Goal 4 (Week 1): Pt will complete toilet transfers with min guard assist using RW for support. OT Short Term Goal 5 (Week 1): Pt will use the RUE as a gross assist for self care tasks with supervision.  :     Skilled Therapeutic Interventions/Progress Updates:    Skilled OT intervention with treatment focus on the following:  functional mobility, RUE NMRE, standing balance, sitting balance;, activity tolerance. . Pt. Ambulated with RW to shower with transfer to bench and mod assist for 1 posterior lean and poor ighting response.  Provided verbal cues for postural control and proprioceptive input.  Pt performed bathing in dynamice sitting postions with min assist for control and balance.  Stood with min assist for peri area.  Dressed EOb with assist with hooks, shoe laces, pants on left foot.  Left pt in recliner with  call bell,phone within reach.    RUE has gross movements at wrist, fingers, forearm.  Decreased proximal stability and control.   Pt tired at end of session.    Therapy Documentation Precautions:  Precautions Precautions: Fall Precaution Comments: RUE hemiparesis greater than LE Restrictions Weight Bearing Restrictions: No      Pain:  none          See Function Navigator for Current Functional Status.   Therapy/Group: Individual Therapy  Lisa Roca 07/08/2015, 7:41 AM

## 2015-07-09 DIAGNOSIS — M791 Myalgia, unspecified site: Secondary | ICD-10-CM | POA: Insufficient documentation

## 2015-07-09 DIAGNOSIS — M6249 Contracture of muscle, multiple sites: Secondary | ICD-10-CM

## 2015-07-09 DIAGNOSIS — M62838 Other muscle spasm: Secondary | ICD-10-CM | POA: Insufficient documentation

## 2015-07-09 NOTE — Progress Notes (Addendum)
Subjective/Complaints: Patient seen lying in bed this morning. He states she feels terrible after falling a muscle on the stationary bike yesterday. She notes that she received pain medications and that relieves the pain.  ROS- denies CP, SOB, N/V/D, urinary problems,   Objective: Vital Signs: Blood pressure 157/74, pulse 71, temperature 97.5 F (36.4 C), temperature source Oral, resp. rate 18, height 5\' 5"  (1.651 m), weight 58.2 kg (128 lb 4.9 oz), SpO2 100 %. No results found. No results found for this or any previous visit (from the past 72 hour(s)).   HEENT: Normocephalic, atraumatic Cardio: RRR and no murmur Resp: CTA B/L and unlabored GI: BS positive and NT, ND Skin:   Intact. Warm and dry. Neuro: Confused Motor 2- Right deltoid, 4 - R biceps and triceps and grip, 4-R HF, KE, 3- ankle DF, Abnormal FMC Ataxic/ dec FMC and Dysarthric Musc/Skel:  + TTP bilateral thighs. No edema.  Gen NAD. vital signs reviewed.  Assessment/Plan: 1. Functional deficits secondary to Left basal ganglia lacunar infarct causing R Hemiparesis and dysarthria which require 3+ hours per day of interdisciplinary therapy in a comprehensive inpatient rehab setting. Physiatrist is providing close team supervision and 24 hour management of active medical problems listed below. Physiatrist and rehab team continue to assess barriers to discharge/monitor patient progress toward functional and medical goals. FIM: Function - Bathing Position: Shower Body parts bathed by patient: Right arm, Chest, Abdomen, Front perineal area, Buttocks, Right upper leg, Left upper leg, Right lower leg, Left lower leg Body parts bathed by helper: Back, Left arm Bathing not applicable: Chest, Abdomen, Front perineal area, Buttocks, Right upper leg, Left upper leg, Left lower leg, Right lower leg Assist Level:  (6/10, 60%, moderate assist)  Function- Upper Body Dressing/Undressing What is the patient wearing?: Bra, Pull over  shirt/dress Bra - Perfomed by patient: Thread/unthread left bra strap Bra - Perfomed by helper: Thread/unthread right bra strap, Hook/unhook bra (pull down sports bra) Pull over shirt/dress - Perfomed by patient: Thread/unthread right sleeve, Thread/unthread left sleeve Pull over shirt/dress - Perfomed by helper: Put head through opening, Pull shirt over trunk Assist Level: Touching or steadying assistance(Pt > 75%) Function - Lower Body Dressing/Undressing What is the patient wearing?: Pants, Underwear, Maryln Manuel, Shoes Position: Education officer, museum at [employer] - Performed by patient: Thread/unthread left underwear leg, Thread/unthread right underwear leg, Pull underwear up/down Underwear - Performed by helper: Pull underwear up/down, Thread/unthread right underwear leg Pants- Performed by patient: Thread/unthread right pants leg, Thread/unthread left pants leg Pants- Performed by helper: Pull pants up/down Shoes - Performed by patient: Don/doff right shoe, Don/doff left shoe Shoes - Performed by helper: Fasten right, Fasten left TED Hose - Performed by helper: Don/doff right TED hose, Don/doff left TED hose Assist for footwear: Maximal assist Assist for lower body dressing:  (4/8 tasks for FIM, 50%, moderate assist)  Function - Toileting Toileting steps completed by patient: Adjust clothing prior to toileting, Performs perineal hygiene, Adjust clothing after toileting Toileting steps completed by helper: Adjust clothing prior to toileting, Adjust clothing after toileting Toileting Assistive Devices: Grab bar or rail Assist level: Supervision or verbal cues  Function - Toilet Transfers Toilet transfer assistive device: Elevated toilet seat/BSC over toilet, Grab bar Assist level to toilet: Supervision or verbal cues Assist level from toilet: Supervision or verbal cues Assist level to bedside commode (at bedside): Moderate assist (Pt 50 - 74%/lift or lower) Assist level from bedside  commode (at bedside): Moderate assist (Pt 50 - 74%/lift or lower)  Function - Chair/bed transfer Chair/bed transfer method: Stand pivot Chair/bed transfer assist level: Touching or steadying assistance (Pt > 75%) Chair/bed transfer assistive device: Walker Chair/bed transfer details: Verbal cues for technique, Verbal cues for precautions/safety, Verbal cues for safe use of DME/AE  Function - Locomotion: Wheelchair Will patient use wheelchair at discharge?: Yes Type: Manual Max wheelchair distance: 70 Assist Level: Supervision or verbal cues Assist Level: Supervision or verbal cues Turns around,maneuvers to table,bed, and toilet,negotiates 3% grade,maneuvers on rugs and over doorsills: No Function - Locomotion: Ambulation Assistive device: Walker-rolling Max distance: 150 Assist level: Touching or steadying assistance (Pt > 75%) Assist level: Touching or steadying assistance (Pt > 75%) Assist level: Touching or steadying assistance (Pt > 75%) Walk 150 feet activity did not occur: Safety/medical concerns Assist level: Touching or steadying assistance (Pt > 75%) Assist level: Maximal assist (Pt 25 - 49%)  Function - Comprehension Comprehension: Auditory Comprehension assist level: Follows complex conversation/direction with extra time/assistive device  Function - Expression Expression: Verbal Expression assist level: Expresses basic 75 - 89% of the time/requires cueing 10 - 24% of the time. Needs helper to occlude trach/needs to repeat words.  Function - Social Interaction Social Interaction assist level: Interacts appropriately with others - No medications needed.  Function - Problem Solving Problem solving assist level: Solves basic 75 - 89% of the time/requires cueing 10 - 24% of the time  Function - Memory Memory assist level: Recognizes or recalls 75 - 89% of the time/requires cueing 10 - 24% of the time Patient normally able to recall (first 3 days only): Current season,  Location of own room, Staff names and faces, That he or she is in a hospital   Medical Problem List and Plan: 1.  Right facial weakness and right hemiparesis with inability to carry out ADL tasks as well as gait disorder secondary to left basal ganglia stroke.   Continue CIR   2.  DVT Prophylaxis/Anticoagulation: Pharmaceutical: Other (comment)-eliquis 3. Pain Management: tylenol prn for pain  Muscle pain today, relieved with when necessary Tylenol 4. Mood: Very motivated. LCSW to follow for evaluation and support.   5. Neuropsych: This patient is capable of making decisions on his own behalf. 6. Skin/Wound Care: Routine pressure relief measures.   7. Fluids/Electrolytes/Nutrition: Monitor I/O. Eating 100% meals and >1060ml fluids  8. Paroxymal A fib: Monitor heart rate bid. Monitor for symptoms with increase in activity level. Continue metoprolol 25 mg bid and amiodarone 100 mg daily.   9. Dyslipidemia: Continue lipitor. 10. HTN: monitor BP bid. Continue metoprolol.  norvasc 5mg  started on 4/6 11. Chronic systolic CHF: Off lasix. Question angioedema with ARB therefore not on ACE.  , no clinical signs of edema weight stable at 58 KG--heart healthy diet.  Continue metoprolol and statin.   10 IBS-D: Resumed probiotic. Reviewed hospital policy on supplements with patient/family.  Being managed with diet.  12.  Spasticity-improved hamstring spasm on qhs tizanidine  Continue ROM  LOS (Days) 6 A FACE TO FACE EVALUATION WAS PERFORMED  Alandria Butkiewicz Lorie Phenix 07/09/2015, 7:03 AM

## 2015-07-10 ENCOUNTER — Inpatient Hospital Stay (HOSPITAL_COMMUNITY): Payer: Medicare PPO | Admitting: Speech Pathology

## 2015-07-10 ENCOUNTER — Inpatient Hospital Stay (HOSPITAL_COMMUNITY): Payer: Medicare PPO | Admitting: Occupational Therapy

## 2015-07-10 ENCOUNTER — Inpatient Hospital Stay (HOSPITAL_COMMUNITY): Payer: Self-pay | Admitting: Physical Therapy

## 2015-07-10 NOTE — Progress Notes (Signed)
Speech Language Pathology Daily Session Note  Patient Details  Name: Karen Bowman MRN: [id number] Date of Birth: 1941/03/30  Today's Date: 07/10/2015 SLP Individual Time: 0930-1030 SLP Individual Time Calculation (min): 60 min  Short Term Goals: Week 1: SLP Short Term Goal 1 (Week 1): Patient will demonstrate problem solving for mildly complex tasks with supervision verbal cues.  SLP Short Term Goal 2 (Week 1): Patient will recall new, daily information with supervision verbal and visual cues.  SLP Short Term Goal 3 (Week 1): Patient will attend to right field of enviornment during functional tasks with supervision verbal cues.  SLP Short Term Goal 4 (Week 1): Patient will self-monitor and correct errors during functional tasks with supervision verbal and question cues.  SLP Short Term Goal 5 (Week 1): Patient will utilize speech intelligibility strategies to achieve 100% intelligibility at the conversation level with supervision verbal cues.   Skilled Therapeutic Interventions: Skilled treatment session focused on addressing cognition goals. Patient required multiple repetitions, 4-5 and extra time to recall directions during a new learning task.  SLP facilitated session by providing demonstration of recall strategy: association.  Patient required Min assist during task. Continue with current plan of care.   Function:   Cognition Comprehension Comprehension assist level: Understands complex 90% of the time/cues 10% of the time  Expression   Expression assist level: Expresses complex 90% of the time/cues < 10% of the time  Social Interaction Social Interaction assist level: Interacts appropriately with others - No medications needed.  Problem Solving Problem solving assist level: Solves basic 90% of the time/requires cueing < 10% of the time  Memory Memory assist level: Recognizes or recalls 75 - 89% of the time/requires cueing 10 - 24% of the time    Pain Pain Assessment Pain  Assessment: No/denies pain  Therapy/Group: Individual Therapy  Carmelia Roller., CCC-SLP [id number]  [place] 07/10/2015, 10:30 AM

## 2015-07-10 NOTE — Progress Notes (Signed)
Physical Therapy Weekly Progress Note  Patient Details  Name: Karen Bowman MRN: [id number] Date of Birth: 12/12/1940  Beginning of progress report period: July 03, 2015 End of progress report period: July 10, 2015   Patient has met 3 of 4 short term goals.    Patient continues to demonstrate the following deficits: cardiovascular endurance, Right upper extremity and lower extremity strength and coordination, dynamic balance, functional mobility and therefore will continue to benefit from skilled PT intervention to enhance overall performance with activity tolerance, balance, functional use of  right upper extremity and right lower extremity, attention, awareness and coordination.  Patient progressing toward long term goals..  Continue plan of care.  PT Short Term Goals Week 1:  PT Short Term Goal 1 (Week 1): Patient will perform bed mobility with supervision. PT Short Term Goal 1 - Progress (Week 1): Progressing toward goal PT Short Term Goal 2 (Week 1): Patient will ambulate 150 ft with min A.  PT Short Term Goal 2 - Progress (Week 1): Met PT Short Term Goal 3 (Week 1): Patient will transfer bed <> wheelchair with min A.  PT Short Term Goal 3 - Progress (Week 1): Met PT Short Term Goal 4 (Week 1): Patient will maintain dynamic standing balance with min A during functional tasks.  PT Short Term Goal 4 - Progress (Week 1): Met Week 2:  PT Short Term Goal 1 (Week 2): STG's equal LTG's due to estimated LOS.   Skilled Therapeutic Interventions/Progress Updates:    Patient continues to demonstrate improvements in functional mobility and endurance. Patient continues to demonstrate improved functional use of right upper extremity and right lower extremity.     Therapy/Group: Individual Therapy  Retta Diones 07/10/2015, 4:34 PM

## 2015-07-10 NOTE — Progress Notes (Signed)
Occupational Therapy Session Note  Patient Details  Name: Karen Bowman MRN: [id number] Date of Birth: November 13, 1940  Today's Date: 07/10/2015 OT Individual Time: [id number] OT Individual Time Calculation (min): 64 min    Short Term Goals: Week 1:  OT Short Term Goal 1 (Week 1): Pt will complete bathing with min assist sit to stand using wash mit PRN.  OT Short Term Goal 2 (Week 1): Pt will complete UB dressing with supervision including sports bra. OT Short Term Goal 3 (Week 1): Pt will perform LB dressing sit to stand with supervision.  OT Short Term Goal 4 (Week 1): Pt will complete toilet transfers with min guard assist using RW for support. OT Short Term Goal 5 (Week 1): Pt will use the RUE as a gross assist for self care tasks with supervision.      Skilled Therapeutic Interventions/Progress Updates:    Pt seen for skilled OT to facilitate RUE AROM, dynamic balance and adaptive ADL techniques with ADL retraining of shower, dressing, grooming.  Pt directed caregiver to obtain clothing for her. Pt stated she was extremely tired today from weekend therapy. She needed extra time and rest time throughout the session due to fatigue. Pt ambulated to shower and doffed clothing from bench before water turned on. Pt actively used R arm during session, even reaching to wash under left armpit.  Pt noted with increased subluxation with forward flexed posture. Subluxation decreases with upright posture. Reviewed need to sit upright prior to lifting R arm.  Pt completed dressing from recliner with extra time, min A for bra and to pull up tight pants.  Pt is developing stronger R grasp and elbow flexion. Pt in recliner with meal tray set up for lunch.  Call light in reach.  Therapy Documentation Precautions:  Precautions Precautions: Fall Precaution Comments: RUE hemiparesis greater than LE Restrictions Weight Bearing Restrictions: No  Pain: Pain Assessment Pain Assessment: No/denies pain ADL:  See Function Navigator for Current Functional Status.   Therapy/Group: Individual Therapy  Latasha Buczkowski 07/10/2015, 12:57 PM

## 2015-07-10 NOTE — Progress Notes (Addendum)
Physical Therapy Session Note  Patient Details  Name: KINJAL NEITZKE MRN: [id number] Date of Birth: 05-31-1940  Today's Date: 07/10/2015 PT Group Time: [id number] PT Group Time Calculation (min): 70 min  Short Term Goals: Week 1:  PT Short Term Goal 1 (Week 1): Patient will perform bed mobility with supervision. PT Short Term Goal 2 (Week 1): Patient will ambulate 150 ft with min A.  PT Short Term Goal 3 (Week 1): Patient will transfer bed <> wheelchair with min A.  PT Short Term Goal 4 (Week 1): Patient will maintain dynamic standing balance with min A during functional tasks.   Therapy Documentation Precautions:  Precautions Precautions: Fall Precaution Comments: RUE hemiparesis greater than LE Restrictions Weight Bearing Restrictions: No Pain: Pain Assessment Pain Assessment: No/denies pain   Sit to and from stand transfer with RW supervision, verbal cues for hand placement and controlled descent.   Patient ambulated 150 feetx2 with RW and right built up handle close supervision and min assist as patient fatigues secondary to decreased right foot clearance. Verbal cues for step length and upright posture.   Patient ambulated 150 feet without assistive device min assist. Min assist for weight shifts in order to improved right foot clearance and step length.   Patient up and down 12 steps with LUE support on right handrail close supervision and incidental min assist during descent of stairs. Patient educated on proper sequence and technique. Verbal cues for pacing and sequencing s well as hand placement on the rail.   Patient performed side stepping to the left and right min to mod assist without assistive device 25 feet.  Patient negotiated 6 foot ramp with min assist  and RW and ambulated 15 feet over uneven terrain with min assist for RW management.   Standing there ex: B heel raises 3x10 Mini squats 3x10  Patient returned to room at end of session seated in recliner  chair with all needs met and spouse present.    See Function Navigator for Current Functional Status.   Therapy/Group: Individual Therapy  Retta Diones 07/10/2015, 4:05 PM

## 2015-07-10 NOTE — Consult Note (Signed)
   [facility] CM Inpatient Consult   07/10/2015  Karen Bowman 25-Dec-1940 [id number] Patient has been screened for [place] [facility] services for post rehab follow up.  Chart reviewed for progress.  Please contact this writer if [place] management needs are identified. Patient is eligible for services.  For questions or referrals, please contact: Natividad Brood, RN BSN [facility] Liaison  [phone] business mobile phone Toll free office [phone]

## 2015-07-10 NOTE — Progress Notes (Signed)
Subjective/Complaints: Feeling better today. Was quite sore after aggravating her hips/legs on exercise bike in therapy Saturday. Happy with progress. Able to zip up night gown this morning.   ROS- denies CP, SOB, N/V/D, urinary problems,   Objective: Vital Signs: Blood pressure 136/60, pulse 64, temperature 98.5 F (36.9 C), temperature source Oral, resp. rate 18, height 5\' 5"  (1.651 m), weight 59 kg (130 lb 1.1 oz), SpO2 100 %. No results found. No results found for this or any previous visit (from the past 72 hour(s)).   HEENT: Normocephalic, atraumatic Cardio: RRR and no murmur Resp: CTA B/L and unlabored GI: BS positive and NT, ND Skin:   Intact. Warm and dry. Neuro: Confused Motor 2 Right deltoid, 4 - R biceps and triceps and 3+ grip, 4-R HF, KE, 3- ankle DF, Abnormal FMC Ataxic/ dec FMC and Dysarthric Musc/Skel:  Thigh ROM improved. Minimal pain today with PROM. No edema.  Gen NAD. vital signs reviewed.  Assessment/Plan: 1. Functional deficits secondary to Left basal ganglia lacunar infarct causing R Hemiparesis and dysarthria which require 3+ hours per day of interdisciplinary therapy in a comprehensive inpatient rehab setting. Physiatrist is providing close team supervision and 24 hour management of active medical problems listed below. Physiatrist and rehab team continue to assess barriers to discharge/monitor patient progress toward functional and medical goals. FIM: Function - Bathing Position: Shower Body parts bathed by patient: Right arm, Chest, Abdomen, Front perineal area, Buttocks, Right upper leg, Left upper leg, Right lower leg, Left lower leg Body parts bathed by helper: Back, Left arm Bathing not applicable: Chest, Abdomen, Front perineal area, Buttocks, Right upper leg, Left upper leg, Left lower leg, Right lower leg Assist Level:  (6/10, 60%, moderate assist)  Function- Upper Body Dressing/Undressing What is the patient wearing?: Bra, Pull over  shirt/dress Bra - Perfomed by patient: Thread/unthread left bra strap Bra - Perfomed by helper: Thread/unthread right bra strap, Hook/unhook bra (pull down sports bra) Pull over shirt/dress - Perfomed by patient: Thread/unthread right sleeve, Thread/unthread left sleeve Pull over shirt/dress - Perfomed by helper: Put head through opening, Pull shirt over trunk Assist Level: Touching or steadying assistance(Pt > 75%) Function - Lower Body Dressing/Undressing What is the patient wearing?: Pants, Underwear, Maryln Manuel, Shoes Position: Education officer, museum at [employer] - Performed by patient: Thread/unthread left underwear leg, Thread/unthread right underwear leg, Pull underwear up/down Underwear - Performed by helper: Pull underwear up/down, Thread/unthread right underwear leg Pants- Performed by patient: Thread/unthread right pants leg, Thread/unthread left pants leg Pants- Performed by helper: Pull pants up/down Shoes - Performed by patient: Don/doff right shoe, Don/doff left shoe Shoes - Performed by helper: Fasten right, Fasten left TED Hose - Performed by helper: Don/doff right TED hose, Don/doff left TED hose Assist for footwear: Maximal assist Assist for lower body dressing:  (4/8 tasks for FIM, 50%, moderate assist)  Function - Toileting Toileting steps completed by patient: Adjust clothing prior to toileting, Performs perineal hygiene, Adjust clothing after toileting Toileting steps completed by helper: Adjust clothing after toileting Toileting Assistive Devices: Grab bar or rail Assist level: Supervision or verbal cues  Function - Toilet Transfers Toilet transfer assistive device: Elevated toilet seat/BSC over toilet, Grab bar Assist level to toilet: Supervision or verbal cues Assist level from toilet: Supervision or verbal cues Assist level to bedside commode (at bedside): Moderate assist (Pt 50 - 74%/lift or lower) Assist level from bedside commode (at bedside): Moderate assist  (Pt 50 - 74%/lift or lower)  Function - Chair/bed transfer  Chair/bed transfer method: Stand pivot Chair/bed transfer assist level: Touching or steadying assistance (Pt > 75%) Chair/bed transfer assistive device: Walker Chair/bed transfer details: Verbal cues for technique, Verbal cues for precautions/safety, Verbal cues for safe use of DME/AE  Function - Locomotion: Wheelchair Will patient use wheelchair at discharge?: Yes Type: Manual Max wheelchair distance: 70 Assist Level: Supervision or verbal cues Assist Level: Supervision or verbal cues Turns around,maneuvers to table,bed, and toilet,negotiates 3% grade,maneuvers on rugs and over doorsills: No Function - Locomotion: Ambulation Assistive device: Walker-rolling Max distance: 150 Assist level: Touching or steadying assistance (Pt > 75%) Assist level: Touching or steadying assistance (Pt > 75%) Assist level: Touching or steadying assistance (Pt > 75%) Walk 150 feet activity did not occur: Safety/medical concerns Assist level: Touching or steadying assistance (Pt > 75%) Assist level: Maximal assist (Pt 25 - 49%)  Function - Comprehension Comprehension: Auditory Comprehension assist level: Follows complex conversation/direction with extra time/assistive device  Function - Expression Expression: Verbal Expression assist level: Expresses basic 75 - 89% of the time/requires cueing 10 - 24% of the time. Needs helper to occlude trach/needs to repeat words.  Function - Social Interaction Social Interaction assist level: Interacts appropriately with others - No medications needed.  Function - Problem Solving Problem solving assist level: Solves basic 75 - 89% of the time/requires cueing 10 - 24% of the time  Function - Memory Memory assist level: Recognizes or recalls 75 - 89% of the time/requires cueing 10 - 24% of the time Patient normally able to recall (first 3 days only): Current season, Location of own room, Staff names and  faces, That he or she is in a hospital   Medical Problem List and Plan: 1.  Right facial weakness and right hemiparesis with inability to carry out ADL tasks as well as gait disorder secondary to left basal ganglia stroke.   Continue CIR    -making neuro and functional progress 2.  DVT Prophylaxis/Anticoagulation: Pharmaceutical: Other (comment)-eliquis 3. Pain Management: tylenol prn for pain  PRN Tylenol 4. Mood: Very motivated. LCSW to follow for evaluation and support.   5. Neuropsych: This patient is capable of making decisions on his own behalf. 6. Skin/Wound Care: Routine pressure relief measures.   7. Fluids/Electrolytes/Nutrition: Monitor I/O. Eating 100% meals and >1046ml fluids  8. Paroxymal A fib: Monitor heart rate bid. Monitor for symptoms with increase in activity level. Continue metoprolol 25 mg bid and amiodarone 100 mg daily.   9. Dyslipidemia: Continue lipitor. 10. HTN: monitor BP bid. Continue metoprolol.  norvasc 5mg  started on 4/6 11. Chronic systolic CHF: Off lasix. Question angioedema with ARB therefore not on ACE.  , no clinical signs of edema weight stable at 59 KG--heart healthy diet.  Continue metoprolol and statin.   10 IBS-D: Resumed probiotic. Reviewed hospital policy on supplements with patient/family.  Being managed with diet.  12.  Spasticity- qhs tizanidine effective  Continue ROM  LOS (Days) 7 A FACE TO FACE EVALUATION WAS PERFORMED  SWARTZ,ZACHARY T 07/10/2015, 8:34 AM

## 2015-07-11 ENCOUNTER — Inpatient Hospital Stay (HOSPITAL_COMMUNITY): Payer: Medicare PPO | Admitting: Physical Therapy

## 2015-07-11 ENCOUNTER — Inpatient Hospital Stay (HOSPITAL_COMMUNITY): Payer: Medicare PPO | Admitting: Occupational Therapy

## 2015-07-11 ENCOUNTER — Inpatient Hospital Stay (HOSPITAL_COMMUNITY): Payer: Medicare PPO | Admitting: Speech Pathology

## 2015-07-11 ENCOUNTER — Inpatient Hospital Stay (HOSPITAL_COMMUNITY): Payer: Self-pay | Admitting: Physical Therapy

## 2015-07-11 NOTE — Progress Notes (Signed)
Speech Language Pathology Weekly Progress and Session Note  Patient Details  Name: Karen Bowman MRN: [id number] Date of Birth: 04/26/1940  Beginning of progress report period: July 04, 2015   End of progress report period: July 11, 2015     Short Term Goals: Week 1: SLP Short Term Goal 1 (Week 1): Patient will demonstrate problem solving for mildly complex tasks with supervision verbal cues.  SLP Short Term Goal 1 - Progress (Week 1): Met SLP Short Term Goal 2 (Week 1): Patient will recall new, daily information with supervision verbal and visual cues.  SLP Short Term Goal 2 - Progress (Week 1): Met SLP Short Term Goal 3 (Week 1): Patient will attend to right field of enviornment during functional tasks with supervision verbal cues.  SLP Short Term Goal 3 - Progress (Week 1): Met SLP Short Term Goal 4 (Week 1): Patient will self-monitor and correct errors during functional tasks with supervision verbal and question cues.  SLP Short Term Goal 4 - Progress (Week 1): Met SLP Short Term Goal 5 (Week 1): Patient will utilize speech intelligibility strategies to achieve 100% intelligibility at the conversation level with supervision verbal cues.  SLP Short Term Goal 5 - Progress (Week 1): Met    New Short Term Goals: Week 2: SLP Short Term Goal 1 (Week 2): STG=LTG due to remaining length of stay   Weekly Progress Updates:  Pt made functional gains this reporting period and has met 5 out of 5 short term goals.  Pt currently requires supervision during semi-complex cognitive tasks due to decreased working memory impacting functional problem solving.  Pt is quickly approaching long term goals for intelligibility in conversations.  Pt and family education is ongoing.  Pt would benefit from brief ST follow up at next level of care to continue to address cognitive-linguistic function.  Also recommend intermittent supervision for medication management at discharge.  Pt would continue to benefit  from skilled ST while inpatient in order to maximize functional independence and reduce burden of care prior to discharge.     Intensity: Minumum of 1-2 x/day, 30 to 90 minutes Frequency: 3 to 5 out of 7 days Duration/Length of Stay: 12-14 days  Treatment/Interventions: Cognitive remediation/compensation;Cueing hierarchy;Functional tasks;Patient/family education;Therapeutic Activities;Internal/external aids;Environmental controls;Speech/Language facilitation     Windell Moulding L 07/11/2015, 4:07 PM

## 2015-07-11 NOTE — Patient Care Conference (Signed)
Inpatient RehabilitationTeam Conference and Plan of Care Update Date: 07/11/2015   Time: 3:00 PM    Patient Name: Karen Bowman      Medical Record Number: [id number]  Date of Birth: Apr 17, 1940 Sex: Female         Room/Bed: 4M06C/4M06C-01 Payor Info: Payor: [employer] MEDICARE / Plan: [place] PPO / Product Type: *No Product type* /    Admitting Diagnosis: cva  Admit Date/Time:  07/03/2015  4:06 PM Admission Comments: No comment available   Primary Diagnosis:  Hemiparesis affecting dominant side as late effect of stroke ([place]) Principal Problem: Hemiparesis affecting dominant side as late effect of stroke [facility])  Patient Active Problem List   Diagnosis Date Noted  . Muscle spasticity   . Muscle pain   . CVA (cerebral infarction) 07/03/2015  . Hemiparesis affecting dominant side as late effect of stroke ([place])   . Gait disturbance, post-stroke   . Paroxysmal atrial fibrillation ([facility])   . Dyslipidemia   . Benign essential HTN   . Chronic systolic congestive heart failure ([place])   . IBS (irritable bowel syndrome)   . Stroke (cerebrum) ([place]) 06/28/2015  . Cerebrovascular accident (CVA) due to occlusion of cerebral artery ([place])   . Stroke ([place]) 06/27/2015  . Counseling regarding end of life decision making 02/02/2015  . Atrophic vaginitis 12/23/2014  . Pelvic pressure in female 12/23/2014  . Urethral caruncle 12/23/2014  . Cystocele, midline 12/23/2014  . Bradycardia 12/12/2014  . Junctional rhythm 12/09/2014  . Chronic systolic heart failure ([place]) 10/31/2014  . A-fib ([place]) 10/14/2014  . Hyponatremia 10/14/2014  . Routine general medical examination at a health care facility 02/01/2014  . Irritable bowel syndrome (IBS) 04/09/2011  . POSTMENOPAUSAL STATUS 02/02/2009  . HYPERLIPIDEMIA 12/26/2006  . HYPERTENSION, BENIGN ESSENTIAL 12/26/2006  . ARTHRITIS, RHEUMATOID 12/26/2006    Expected Discharge Date: Expected Discharge Date: 07/15/15  Team Members  Present: Physician leading conference: Dr. Alger Simons Social Worker Present: Ovidio Kin, LCSW Nurse Present: Dorien Chihuahua, RN PT Present: Raylene Everts, PT OT Present: Willeen Cass, OT SLP Present: Windell Moulding, SLP PPS Coordinator present : Daiva Nakayama, RN, CRRN     Current Status/Progress Goal Weekly Team Focus  Medical   right leg pain better. some soreness over weekend after using bike. making functional progress RUE,RLE  HR mgt, improve functional mobility  spastiicty mgt, pain mgt   Bowel/Bladder   cont x 2; LBM 4/10  To remain continent of Bladder and Bowel  Monitor B&B   Swallow/Nutrition/ Hydration             ADL's   close S with bathing and dressing, improving RUE functional movement. steady A with transfers using RW.  supervison overall  ADL retraining, transfer training, RUE NMR, balance activities, pt/family education   Mobility   Supervision with AD, min A without AD  Mod I bed mobility and basic transfers, supervision car transfers, gait and stairs  R NMR, D/C planning, gait, stair and balance training, family education   Communication   Supervision-mod I  mod I   continue to address carryover and intelligiiblity in conversations   Safety/Cognition/ Behavioral Observations  supervision   mod I  continue to address higher level cognition    Pain   C/o spasms/pain in R leg  <3  Assess qshift for pain   Skin   CDI   No new breakdown/infection  Assess skin q shift      *See Care Plan and progress notes for long  and short-term goals.  Barriers to Discharge: safety awareness, fall risk    Possible Resolutions to Barriers:  continued NMR, arrangement of supervision at home?    Discharge Planning/Teaching Needs:  Home with husband who can provide 24 hr supervision-coming in for education this week.  Pt's husband can come for family education as needed.   Team Discussion:  Husband has been in for education, preparing for DC Sat. Goals upgraded to mod/i in  household and supervision in community and with ADL's. Spasms in right leg-managed by medication and over did it over the weekend. Begin Home health and transition to Op.   Revisions to Treatment Plan:  DC Sat   Continued Need for Acute Rehabilitation Level of Care: The patient requires daily medical management by a physician with specialized training in physical medicine and rehabilitation for the following conditions: Daily direction of a multidisciplinary physical rehabilitation program to ensure safe treatment while eliciting the highest outcome that is of practical value to the patient.: Yes Daily medical management of patient stability for increased activity during participation in an intensive rehabilitation regime.: Yes Daily analysis of laboratory values and/or radiology reports with any subsequent need for medication adjustment of medical intervention for : Cardiac problems;Neurological problems  Elease Hashimoto 07/11/2015, 3:31 PM

## 2015-07-11 NOTE — Progress Notes (Signed)
Speech Language Pathology Daily Session Note  Patient Details  Name: Karen Bowman MRN: [id number] Date of Birth: 1940/12/06  Today's Date: 07/11/2015 SLP Individual Time: [id number] SLP Individual Time Calculation (min): 45 min  Short Term Goals: Week 1: SLP Short Term Goal 1 (Week 1): Patient will demonstrate problem solving for mildly complex tasks with supervision verbal cues.  SLP Short Term Goal 2 (Week 1): Patient will recall new, daily information with supervision verbal and visual cues.  SLP Short Term Goal 3 (Week 1): Patient will attend to right field of enviornment during functional tasks with supervision verbal cues.  SLP Short Term Goal 4 (Week 1): Patient will self-monitor and correct errors during functional tasks with supervision verbal and question cues.  SLP Short Term Goal 5 (Week 1): Patient will utilize speech intelligibility strategies to achieve 100% intelligibility at the conversation level with supervision verbal cues.   Skilled Therapeutic Interventions:  Pt was seen for skilled ST targeting communication goals.  SLP facilitated the session with a novel card game targeting intelligibility in conversations while multitasking.  Pt was 100% intelligible at the conversational level with mod I while engaged in card game.  Pt reports her speech feels "easier" today in comparison to last week.  Pt is continuing to make progress towards short term and long term goals.  Continue per current plan of care.    Function:  Eating Eating                 Cognition Comprehension Comprehension assist level: Understands complex 90% of the time/cues 10% of the time  Expression   Expression assist level: Expresses complex 90% of the time/cues < 10% of the time  Social Interaction Social Interaction assist level: Interacts appropriately with others - No medications needed.  Problem Solving Problem solving assist level: Solves basic 90% of the time/requires cueing < 10% of the  time  Memory Memory assist level: Recognizes or recalls 75 - 89% of the time/requires cueing 10 - 24% of the time    Pain Pain Assessment Pain Assessment: No/denies pain  Therapy/Group: Individual Therapy  Maame Dack, Selinda Orion 07/11/2015, 12:37 PM

## 2015-07-11 NOTE — Progress Notes (Signed)
Physical Therapy Session Note  Patient Details  Name: Karen Bowman MRN: [id number] Date of Birth: 1940/08/15  Today's Date: 07/11/2015 PT Individual Time: 1000-1100  PT Individual Time Calculation (min): 60 min   Short Term Goals: Week 2:  PT Short Term Goal 1 (Week 2): STG's equal LTG's due to estimated LOS.   Skilled Therapeutic Interventions/Progress Updates:    Pt received seated in recliner, denies pain but does report muscle soreness in B thighs. Performed upper/lower body dressing at sit <>stand level with minA upper body, maxA lower body due to urgency with pt reporting need to use restroom; totalA for TEDs, minA for hooking bra, and standbyA for standing balance to don/doff underwear/pants, perform peri hygiene. Requires increased time for dressing due to RUE strength/coordination deficits. Gait to/from bathroom without AD, min guard. Standing at sink to wash hands, brush teeth with close S, cues for even foot placement to increase weight bearing through RLE. Gait to/from gym 2x175' with RW and close S; note mild scissoring, occasional R foot drag. Removed hand splint from RW due to improving R grip strength. Standing on kinetron for forced R weight shift, weight bearing and LE strengthening however pt unable to tolerate due to B knee pain, L hip pain. Sit <>stand from edge of mat table with LLE on 3" step, however pt again unable to tolerate due to pain. Supine with wedge, pt instructed in bridging with LLE foot raises for forced use of RLE, weight bearing and hip strengthening/coordination. Requires max verbal/tactile cues for correct sequencing and technique. Returned to room with gait as described above. Remained seated in recliner at completion of session, all needs within reach.   Therapy Documentation Precautions:  Precautions Precautions: Fall Precaution Comments: RUE hemiparesis greater than LE Restrictions Weight Bearing Restrictions: No Pain: Pain Assessment Pain  Assessment: No/denies pain   See Function Navigator for Current Functional Status.   Therapy/Group: Individual Therapy  Luberta Mutter 07/11/2015, 12:21 PM

## 2015-07-11 NOTE — Progress Notes (Signed)
Subjective/Complaints: Proud that she walked to nurses station yesterday. A little sore but tolerable. Slept well  ROS- denies CP, SOB, N/V/D, urinary problems,   Objective: Vital Signs: Blood pressure 146/48, pulse 54, temperature 97.3 F (36.3 C), temperature source Oral, resp. rate 18, height 5\' 5"  (1.651 m), weight 58.6 kg (129 lb 3 oz), SpO2 100 %. No results found. No results found for this or any previous visit (from the past 72 hour(s)).   HEENT: Normocephalic, atraumatic Cardio: RRR and no murmur Resp: CTA B/L and unlabored GI: BS positive and NT, ND Skin:   Intact. Warm and dry. Neuro: Confused Motor 2 Right deltoid, 4 - R biceps and triceps and 3+ grip, 4-R HF, KE, 3- ankle DF, Abnormal FMC Ataxic/ dec FMC and Dysarthric Musc/Skel:  Thigh ROM improved. Minimal pain today with PROM. No edema.  Gen NAD. vital signs reviewed.  Assessment/Plan: 1. Functional deficits secondary to Left basal ganglia lacunar infarct causing R Hemiparesis and dysarthria which require 3+ hours per day of interdisciplinary therapy in a comprehensive inpatient rehab setting. Physiatrist is providing close team supervision and 24 hour management of active medical problems listed below. Physiatrist and rehab team continue to assess barriers to discharge/monitor patient progress toward functional and medical goals. FIM: Function - Bathing Position: Shower Body parts bathed by patient: Right arm, Chest, Abdomen, Front perineal area, Buttocks, Right upper leg, Left upper leg, Right lower leg, Left lower leg, Left arm Body parts bathed by helper: Back, Left arm Bathing not applicable: Chest, Abdomen, Front perineal area, Buttocks, Right upper leg, Left upper leg, Left lower leg, Right lower leg Assist Level:  (6/10, 60%, moderate assist)  Function- Upper Body Dressing/Undressing What is the patient wearing?: Bra, Pull over shirt/dress Bra - Perfomed by patient: Thread/unthread right bra strap,  Thread/unthread left bra strap Bra - Perfomed by helper: Hook/unhook bra (pull down sports bra) Pull over shirt/dress - Perfomed by patient: Thread/unthread right sleeve, Thread/unthread left sleeve, Put head through opening, Pull shirt over trunk Pull over shirt/dress - Perfomed by helper: Put head through opening, Pull shirt over trunk Assist Level: Touching or steadying assistance(Pt > 75%) Function - Lower Body Dressing/Undressing What is the patient wearing?: Pants, Underwear, Maryln Manuel, Shoes Position: Education officer, museum at [employer] - Performed by patient: Thread/unthread left underwear leg, Thread/unthread right underwear leg, Pull underwear up/down Underwear - Performed by helper: Pull underwear up/down, Thread/unthread right underwear leg Pants- Performed by patient: Thread/unthread right pants leg, Thread/unthread left pants leg Pants- Performed by helper: Pull pants up/down Shoes - Performed by patient: Don/doff right shoe, Don/doff left shoe Shoes - Performed by helper: Fasten right, Fasten left TED Hose - Performed by helper: Don/doff right TED hose, Don/doff left TED hose Assist for footwear: Maximal assist Assist for lower body dressing:  (4/8 tasks for FIM, 50%, moderate assist)  Function - Toileting Toileting steps completed by patient: Adjust clothing prior to toileting, Performs perineal hygiene, Adjust clothing after toileting Toileting steps completed by helper: Adjust clothing after toileting Toileting Assistive Devices: Grab bar or rail Assist level: Supervision or verbal cues  Function - Toilet Transfers Toilet transfer assistive device: Elevated toilet seat/BSC over toilet, Grab bar Assist level to toilet: Supervision or verbal cues Assist level from toilet: Supervision or verbal cues Assist level to bedside commode (at bedside): Moderate assist (Pt 50 - 74%/lift or lower) Assist level from bedside commode (at bedside): Moderate assist (Pt 50 - 74%/lift or  lower)  Function - Chair/bed transfer Chair/bed transfer method:  Stand pivot Chair/bed transfer assist level: Touching or steadying assistance (Pt > 75%) Chair/bed transfer assistive device: Walker Chair/bed transfer details: Verbal cues for technique, Verbal cues for precautions/safety, Verbal cues for safe use of DME/AE  Function - Locomotion: Wheelchair Will patient use wheelchair at discharge?: Yes Type: Manual Max wheelchair distance: 70 Assist Level: Supervision or verbal cues Assist Level: Supervision or verbal cues Turns around,maneuvers to table,bed, and toilet,negotiates 3% grade,maneuvers on rugs and over doorsills: No Function - Locomotion: Ambulation Assistive device: Walker-rolling Max distance: 150 Assist level: Touching or steadying assistance (Pt > 75%) Assist level: Touching or steadying assistance (Pt > 75%) Assist level: Touching or steadying assistance (Pt > 75%) Walk 150 feet activity did not occur: Safety/medical concerns Assist level: Touching or steadying assistance (Pt > 75%) Assist level: Maximal assist (Pt 25 - 49%)  Function - Comprehension Comprehension: Auditory Comprehension assist level: Understands complex 90% of the time/cues 10% of the time  Function - Expression Expression: Verbal Expression assist level: Expresses complex 90% of the time/cues < 10% of the time  Function - Social Interaction Social Interaction assist level: Interacts appropriately with others - No medications needed.  Function - Problem Solving Problem solving assist level: Solves basic 90% of the time/requires cueing < 10% of the time  Function - Memory Memory assist level: Recognizes or recalls 75 - 89% of the time/requires cueing 10 - 24% of the time Patient normally able to recall (first 3 days only): Current season, Location of own room, Staff names and faces, That he or she is in a hospital   Medical Problem List and Plan: 1.  Right facial weakness and right  hemiparesis with inability to carry out ADL tasks as well as gait disorder secondary to left basal ganglia stroke.   Continue rehab  -making neuro and functional progress 2.  DVT Prophylaxis/Anticoagulation: Pharmaceutical: Other (comment)-eliquis 3. Pain Management: tylenol prn for pain  PRN Tylenol 4. Mood: Very motivated. LCSW to follow for evaluation and support.   5. Neuropsych: This patient is capable of making decisions on his own behalf. 6. Skin/Wound Care: Routine pressure relief measures.   7. Fluids/Electrolytes/Nutrition: good appetite 8. Paroxymal A fib: Monitor heart rate bid. Monitor for symptoms with increase in activity level. Continue metoprolol 25 mg bid and amiodarone 100 mg daily.   9. Dyslipidemia: Continue lipitor. 10. HTN: monitor BP bid. Continue metoprolol.  norvasc 5mg  started on 4/6 11. Chronic systolic CHF: Off lasix. Question angioedema with ARB therefore not on ACE.  , no clinical signs of edema weight stable at 58.6 KG--heart healthy diet.  Continue metoprolol and statin.   10 IBS-D:  probiotic.  Diet mgt.  12.  Spasticity- qhs tizanidine effective  Continue ROM  LOS (Days) 8 A FACE TO FACE EVALUATION WAS PERFORMED  Karen Bowman T 07/11/2015, 8:34 AM

## 2015-07-11 NOTE — Progress Notes (Signed)
Occupational Therapy Session Note  Patient Details  Name: Karen Bowman MRN: [id number] Date of Birth: 02/12/1941  Today's Date: 07/11/2015 OT Individual Time: 1330-1430 OT Individual Time Calculation (min): 60 min    Short Term Goals: Week 1:  OT Short Term Goal 1 (Week 1): Pt will complete bathing with min assist sit to stand using wash mit PRN.  OT Short Term Goal 2 (Week 1): Pt will complete UB dressing with supervision including sports bra. OT Short Term Goal 3 (Week 1): Pt will perform LB dressing sit to stand with supervision.  OT Short Term Goal 4 (Week 1): Pt will complete toilet transfers with min guard assist using RW for support. OT Short Term Goal 5 (Week 1): Pt will use the RUE as a gross assist for self care tasks with supervision.   Skilled Therapeutic Interventions/Progress Updates:    Treatment session with focus on functional mobility, activity tolerance, and RUE NMR.  Pt ambulated to sink to complete oral care.  Pt able to open and apply toothpaste this session with use of RUE as gross assist.  In therapy gym, engaged in [place] in sitting with focus on shoulder elevation and flexion/extension.  Utilized UE Ranger to facilitate shoulder flexion/extension with improved motor control. Applied NMES to supraspinatus and middle deltoid to help approximate shoulder joint to reduce sublux.  Set to 1:1 NMES applied to supraspinatus and middle deltoid to help approximate shoulder joint to reduce sublux and reduce pain. Completed for 2 sets of 10 mins with intensity set to 18, with no reports of pain and no adverse reactions to stimulation.  Engaged in reaching task at table top while utilizing e-stim with pt requiring support under Lt elbow to decrease effects of gravity to allow reaching.  Therapy Documentation Precautions:  Precautions Precautions: Fall Precaution Comments: RUE hemiparesis greater than LE Restrictions Weight Bearing Restrictions: No Pain: Pain  Assessment Pain Assessment: No/denies pain  See Function Navigator for Current Functional Status.   Therapy/Group: Individual Therapy  Simonne Come 07/11/2015, 3:13 PM

## 2015-07-11 NOTE — Progress Notes (Signed)
Physical Therapy Session Note  Patient Details  Name: Karen Bowman MRN: [id number] Date of Birth: [birth year]  Today's Date: 07/11/2015 1110-1150 40 min individual treatment   Short Term Goals: Week 1:  PT Short Term Goal 1 (Week 1): Patient will perform bed mobility with supervision. PT Short Term Goal 1 - Progress (Week 1): Progressing toward goal PT Short Term Goal 2 (Week 1): Patient will ambulate 150 ft with min A.  PT Short Term Goal 2 - Progress (Week 1): Met PT Short Term Goal 3 (Week 1): Patient will transfer bed <> wheelchair with min A.  PT Short Term Goal 3 - Progress (Week 1): Met PT Short Term Goal 4 (Week 1): Patient will maintain dynamic standing balance with min A during functional tasks.  PT Short Term Goal 4 - Progress (Week 1): Met Week 2:  PT Short Term Goal 1 (Week 2): STG's equal LTG's due to estimated LOS.    Therapy Documentation Precautions:  Precautions Precautions: Fall Precaution Comments: RUE hemiparesis greater than LE Restrictions Weight Bearing Restrictions: No  Pain: Pain Assessment Pain Assessment: No/denies pain  Patient reports increased fatigue and right hamstring tightness during session.   Sit to and from stand transfer with RW supervision, verbal cues for hand placement and controlled descent.   Patient ambulated 200 feetx2 with RW and close supervision. Patient demonstrates improved step length and right foot clearance. Verbal cues for step length and upright posture.   Patient up and down 8 stepsx3 with RW close supervision and incidental min assist during ascentof stairs and min assist during descent. Patient educated on proper sequence and technique.   Discussion with patient and spouse present for spouse to take a picture of the stairs at home and to see if someone would be able to measure the depth of the stairs to confirm a wheelchair would fit on the stairs for patient to ascend and descend.   Patient seated in wheelchair at  end of session and brought to dayroom for patient advisory council.   See Function Navigator for Current Functional Status.   Therapy/Group: Individual Therapy  Retta Diones 07/11/2015, 12:07 PM

## 2015-07-12 ENCOUNTER — Inpatient Hospital Stay (HOSPITAL_COMMUNITY): Payer: Medicare PPO

## 2015-07-12 ENCOUNTER — Inpatient Hospital Stay (HOSPITAL_COMMUNITY): Payer: Medicare PPO | Admitting: Physical Therapy

## 2015-07-12 ENCOUNTER — Inpatient Hospital Stay (HOSPITAL_COMMUNITY): Payer: Self-pay | Admitting: Speech Pathology

## 2015-07-12 ENCOUNTER — Inpatient Hospital Stay (HOSPITAL_COMMUNITY): Payer: Self-pay | Admitting: Occupational Therapy

## 2015-07-12 NOTE — Progress Notes (Signed)
Physical Therapy Note  Patient Details  Name: Karen Bowman MRN: [id number] Date of Birth: [birth year] Today's Date: 07/12/2015    Patient missed 30 min of physical therapy session, secondary to reports of increased fatigue and patient requesting to rest prior to community outing. Will attempt to reschedule and another time.   Retta Diones 07/12/2015, 10:40 AM

## 2015-07-12 NOTE — Progress Notes (Signed)
Occupational Therapy Session Note  Patient Details  Name: Karen Bowman MRN: [id number] Date of Birth: 1940/10/13  Today's Date: 07/12/2015 OT Individual Time: [id number] Individual Time Calculation (min): 32 min    Skilled Therapeutic Interventions/Progress Updates:    Pt ambulated down to the ADL kitchen with the RW and min guard assist during session. Worked in standing at the counter with focus on RUE shoulder AROM during session.  Had pt work on washing the counter top with the RUE and washcloth under hand with min assist for 4 mins without resting.  Min facilitation needed to perform reaching movements with full elbow extension.  When resting discussed ways to utilize the RW for simple meal prep in the kitchen.  Discussed use of a walker bag and possible walker tray or basket as well as using counter tops and flat surfaces to help transfer items around.  She ambulated back to her recliner in the room with min guard assist to conclude session.  Call button and phone within reach.    Therapy Documentation Precautions:  Precautions Precautions: Fall Precaution Comments: RUE hemiparesis greater than LE Restrictions Weight Bearing Restrictions: No  Pain: Pain Assessment Pain Assessment: No/denies pain ADL: See Function Navigator for Current Functional Status.   Therapy/Group: Individual Therapy  Jackston O[place]aca OTR/L 07/12/2015, 3:57 PM

## 2015-07-12 NOTE — Progress Notes (Signed)
Subjective/Complaints: Feeling well. Progressing with therapies. Had a great night of sleep.  ROS- denies CP, SOB, N/V/D, urinary problems,   Objective: Vital Signs: Blood pressure 155/52, pulse 58, temperature 97.6 F (36.4 C), temperature source Oral, resp. rate 18, height 5\' 5"  (1.651 m), weight 58.6 kg (129 lb 3 oz), SpO2 100 %. No results found. No results found for this or any previous visit (from the past 72 hour(s)).   HEENT: Normocephalic, atraumatic Cardio: RRR and no murmur Resp: CTA B/L and unlabored GI: BS positive and NT, ND Skin:   Intact. Warm and dry. Neuro: Confused Motor 2 Right deltoid, 4 - R biceps and triceps and 3+ grip, 4-R HF, KE, 3- ankle DF, Abnormal FMC Ataxic/ dec FMC and Dysarthric Musc/Skel:  Thigh ROM improved. no pain today with PROM. No edema.  Gen NAD. vital signs reviewed.  Assessment/Plan: 1. Functional deficits secondary to Left basal ganglia lacunar infarct causing R Hemiparesis and dysarthria which require 3+ hours per day of interdisciplinary therapy in a comprehensive inpatient rehab setting. Physiatrist is providing close team supervision and 24 hour management of active medical problems listed below. Physiatrist and rehab team continue to assess barriers to discharge/monitor patient progress toward functional and medical goals. FIM: Function - Bathing Position: Shower Body parts bathed by patient: Right arm, Chest, Abdomen, Front perineal area, Buttocks, Right upper leg, Left upper leg, Right lower leg, Left lower leg, Left arm Body parts bathed by helper: Back, Left arm Bathing not applicable: Chest, Abdomen, Front perineal area, Buttocks, Right upper leg, Left upper leg, Left lower leg, Right lower leg Assist Level:  (6/10, 60%, moderate assist)  Function- Upper Body Dressing/Undressing What is the patient wearing?: Bra, Pull over shirt/dress Bra - Perfomed by patient: Thread/unthread right bra strap, Thread/unthread left bra  strap Bra - Perfomed by helper: Hook/unhook bra (pull down sports bra) Pull over shirt/dress - Perfomed by patient: Thread/unthread right sleeve, Thread/unthread left sleeve, Put head through opening, Pull shirt over trunk Pull over shirt/dress - Perfomed by helper: Put head through opening, Pull shirt over trunk Assist Level: Touching or steadying assistance(Pt > 75%) Function - Lower Body Dressing/Undressing What is the patient wearing?: Pants, Underwear, Ted Hose, Shoes Position:  (seated in recliner) Underwear - Performed by patient: Thread/unthread left underwear leg, Thread/unthread right underwear leg, Pull underwear up/down Underwear - Performed by helper: Pull underwear up/down, Thread/unthread right underwear leg Pants- Performed by patient: Thread/unthread right pants leg, Thread/unthread left pants leg Pants- Performed by helper: Pull pants up/down Shoes - Performed by patient: Don/doff right shoe, Don/doff left shoe Shoes - Performed by helper: Fasten right, Fasten left TED Hose - Performed by helper: Don/doff right TED hose, Don/doff left TED hose Assist for footwear: Maximal assist Assist for lower body dressing:  (4/8 tasks for FIM, 50%, moderate assist)  Function - Toileting Toileting steps completed by patient: Adjust clothing prior to toileting, Performs perineal hygiene, Adjust clothing after toileting Toileting steps completed by helper: Adjust clothing after toileting Toileting Assistive Devices: Grab bar or rail Assist level: Supervision or verbal cues  Function - Toilet Transfers Toilet transfer assistive device: Elevated toilet seat/BSC over toilet, Grab bar Assist level to toilet: Supervision or verbal cues Assist level from toilet: Supervision or verbal cues Assist level to bedside commode (at bedside): Moderate assist (Pt 50 - 74%/lift or lower) Assist level from bedside commode (at bedside): Moderate assist (Pt 50 - 74%/lift or lower)  Function - Chair/bed  transfer Chair/bed transfer method: Stand pivot Chair/bed  transfer assist level: Touching or steadying assistance (Pt > 75%) Chair/bed transfer assistive device: Walker Chair/bed transfer details: Verbal cues for technique, Verbal cues for precautions/safety, Verbal cues for safe use of DME/AE  Function - Locomotion: Wheelchair Will patient use wheelchair at discharge?: Yes Type: Manual Max wheelchair distance: 70 Assist Level: Supervision or verbal cues Assist Level: Supervision or verbal cues Turns around,maneuvers to table,bed, and toilet,negotiates 3% grade,maneuvers on rugs and over doorsills: No Function - Locomotion: Ambulation Assistive device: Walker-rolling Max distance: 150 Assist level: Supervision or verbal cues Assist level: Supervision or verbal cues Assist level: Supervision or verbal cues Walk 150 feet activity did not occur: Safety/medical concerns Assist level: Supervision or verbal cues Assist level: Maximal assist (Pt 25 - 49%)  Function - Comprehension Comprehension: Auditory Comprehension assist level: Understands complex 90% of the time/cues 10% of the time  Function - Expression Expression: Verbal Expression assist level: Expresses complex 90% of the time/cues < 10% of the time  Function - Social Interaction Social Interaction assist level: Interacts appropriately with others - No medications needed.  Function - Problem Solving Problem solving assist level: Solves basic 90% of the time/requires cueing < 10% of the time  Function - Memory Memory assist level: Recognizes or recalls 75 - 89% of the time/requires cueing 10 - 24% of the time Patient normally able to recall (first 3 days only): Current season, Location of own room, Staff names and faces, That he or she is in a hospital   Medical Problem List and Plan: 1.  Right facial weakness and right hemiparesis with inability to carry out ADL tasks as well as gait disorder secondary to left basal  ganglia stroke.   Continue rehab   2.  DVT Prophylaxis/Anticoagulation: Pharmaceutical: Other (comment)-eliquis 3. Pain Management: tylenol prn for pain  PRN Tylenol 4. Mood: Very motivated. LCSW to follow for evaluation and support.   5. Neuropsych: This patient is capable of making decisions on his own behalf. 6. Skin/Wound Care: Routine pressure relief measures.   7. Fluids/Electrolytes/Nutrition: good appetite 8. Paroxymal A fib: Monitor heart rate bid. Monitor for symptoms with increase in activity level. Continue metoprolol 25 mg bid and amiodarone 100 mg daily.   9. Dyslipidemia: Continue lipitor. 10. HTN: monitor BP bid. Continue metoprolol.  norvasc 5mg  started on 4/6 11. Chronic systolic CHF: Off lasix. Question angioedema with ARB therefore not on ACE.  , no clinical signs of edema weight stable at 58 KG--heart healthy diet.  Continue metoprolol and statin.   10 IBS-D:  probiotic.  Diet mgt.  12.  Spasticity- qhs tizanidine effective  Continue ROM  LOS (Days) 9 A FACE TO FACE EVALUATION WAS PERFORMED  Graceanne Guin T 07/12/2015, 8:38 AM

## 2015-07-12 NOTE — Progress Notes (Signed)
Occupational Therapy Note  Patient Details  Name: Karen Bowman MRN: [id number] Date of Birth: 02/07/1941  Today's Date: 07/12/2015 OT Concurrent Time: 1000-1130 OT Concurrent Time Calculation (min): 90 min  Pt denied pain Concurrent therapy   Pt participated at an overall close supervision-min assist level using RW. Goals focued on safe community mobility, identification & negotiation of obstacles, accessing public restrooms, obtaining items for various heights of shelves & curb negotiation. See outing goal sheet for full details.  Leotis Shames [facility] 07/12/2015, 12:14 PM

## 2015-07-12 NOTE — Progress Notes (Signed)
Speech Language Pathology Daily Session Note  Patient Details  Name: Karen Bowman MRN: [id number] Date of Birth: 1941-01-29  Today's Date: 07/12/2015 SLP Individual Time: 1400-1445 SLP Individual Time Calculation (min): 45 min  Short Term Goals: Week 2: SLP Short Term Goal 1 (Week 2): STG=LTG due to remaining length of stay   Skilled Therapeutic Interventions: Skilled treatment session focused on cognitive goals. SLP facilitated session by providing supervision question and verbal cues for anticipatory awareness in regards to activities/tasks she can complete at home safely and to identify tasks/activities she will need assistance with at home. Patient was 100% intelligible at the conversation level with Mod I and recalled events from previous therapy sessions with supervision question cues. Patient left upright in recliner with all needs within reach. Continue with current plan of care.     Function:  Cognition Comprehension Comprehension assist level: Understands complex 90% of the time/cues 10% of the time  Expression   Expression assist level: Expresses complex ideas: With extra time/assistive device  Social Interaction Social Interaction assist level: Interacts appropriately with others - No medications needed.  Problem Solving Problem solving assist level: Solves complex 90% of the time/cues < 10% of the time  Memory Memory assist level: Recognizes or recalls 90% of the time/requires cueing < 10% of the time    Pain No/Denies Pain   Therapy/Group: Individual Therapy  Wirt Hemmerich 07/12/2015, 3:54 PM

## 2015-07-12 NOTE — Plan of Care (Signed)
Problem: RH Ambulation Goal: LTG Patient will ambulate in controlled environment (PT) LTG: Patient will ambulate in a controlled environment, # of feet with assistance (PT).  Upgraded due to patient progress

## 2015-07-12 NOTE — Progress Notes (Signed)
Physical Therapy Session Note  Patient Details  Name: TORAL BESTER MRN: [id number] Date of Birth: 1940-12-25  Today's Date: 07/12/2015 PT Individual Time: 1545-1610 PT Individual Time Calculation (min): 25 min   Short Term Goals: Week 2:  PT Short Term Goal 1 (Week 2): STG's equal LTG's due to estimated LOS.   Skilled Therapeutic Interventions/Progress Updates:   Patient in bed upon arrival, reporting fatigue from outing and earlier therapies. Patient transferred to wheelchair with supervision and propelled toward therapy gym using BLE only for RLE NMR with supervision. Patient recalled technique for stair negotiation using RW and negotiated up/down 16 (3") stairs using RW with supervision and step-to pattern with verbal cues for safe RW placement on first step only when descending. After completing stairs, patient stated, "That wasn't so hard," and appeared less anxious. Patient ambulated back to room using RW with supervision and requested to return to bed, left supine with RUE positioned on pillow and all needs in reach.   Therapy Documentation Precautions:  Precautions Precautions: Fall Precaution Comments: RUE hemiparesis greater than LE Restrictions Weight Bearing Restrictions: No Pain: Pain Assessment Pain Assessment: No/denies pain   See Function Navigator for Current Functional Status.   Therapy/Group: Individual Therapy  Laretta Alstrom 07/12/2015, 4:16 PM

## 2015-07-12 NOTE — Plan of Care (Signed)
Problem: RH Ambulation Goal: LTG Patient will ambulate in home environment (PT) LTG: Patient will ambulate in home environment, # of feet with assistance (PT).  Upgraded due to patient progress

## 2015-07-12 NOTE — Progress Notes (Signed)
Recreational Therapy Session Note  Patient Details  Name: Karen Bowman MRN: [id number] Date of Birth: 30-Dec-1940 Todays Date: 07/12/2015  Pain: no c/o Skilled Therapeutic Interventions/Progress Updates: Pt referred by team to participate in community reintegration/outing today.  Pt participated at an overall close supervision-min assist level using RW.  Goals focued on safe community mobility, identification & negotiation of obstacles, accessing public restrooms, obtaining items for various heights of shelves & curb negotiation.  See outing goal sheet for full details.  [place] 07/12/2015, 12:00 PM

## 2015-07-13 ENCOUNTER — Inpatient Hospital Stay (HOSPITAL_COMMUNITY): Payer: Medicare PPO | Admitting: Occupational Therapy

## 2015-07-13 ENCOUNTER — Inpatient Hospital Stay (HOSPITAL_COMMUNITY): Payer: Medicare PPO

## 2015-07-13 ENCOUNTER — Inpatient Hospital Stay (HOSPITAL_COMMUNITY): Payer: Medicare PPO | Admitting: Speech Pathology

## 2015-07-13 NOTE — Progress Notes (Signed)
Social Work Discharge Note  The overall goal for the admission was met for:   Discharge location: Yes - home with her husband to provide 24/7 supervision  Length of Stay: Yes - 12 days  Discharge activity level: Yes - supervision with some modified independent tasks  Home/community participation: Yes  Services provided included: MD, RD, PT, OT, SLP, RN, TR, Pharmacy, [place]: Private Insurance: [employer]  Follow-up services arranged: Home Health: PT/OT/SLP/bath aide from [place], DME: rolling walker from [facility] and Patient/Family has no preference for HH/DME agencies  Comments (or additional information:  Pt to return to her home with her husband to provide supervision.  He can drive her to appointments and run errands.  Pt's son had someone install grab bars at home and install handheld shower head.  Shower seat was installed, as well.  Pt will have [place] to continue therapies and transition her to outpt therapies when appropriate.  Pt feels ready to d/c home and husband has received family education.    Patient/Family verbalized understanding of follow-up arrangements: Yes  Individual responsible for coordination of the follow-up plan: Pt with assistance from her husband.  Confirmed correct DME delivered: Trey Sailors 07/13/2015    Esvin Hnat, Silvestre Mesi

## 2015-07-13 NOTE — Progress Notes (Addendum)
Occupational Therapy Session Note  Patient Details  Name: Karen Bowman MRN: [id number] Date of Birth: 1940-12-23  Today's Date: 07/13/2015 OT Individual Time: 1100-1200 OT Individual Time Calculation (min): 60 min    Skilled Therapeutic Interventions/Progress Updates:   Session 1:  Pt worked on bathing and dressing shower level.  Supervision for transfer to the walk-in shower using the RW for support.  She was able to remove all dirty clothing with min guard assist except for TEDS, which therapist assisted with.  She completed bathing with min assist from therapist for integration of the RUE to wash the left arm.  Encouraged pt to use the RUE as much as possible for bathing with self hand over hand assist as needed.  Completed dressing with min guard assist as well sit to stand at the sink.  Pt still needs use of the LUE primarily for pulling up underpants and pants on the right side.  Therapist assisted with TEDs but pt was able to donn shoes and use shoe buttons with supervision to secure them.    Session 2:  [id number]) 33 mins  Pt completed simulated walk-in shower transfers with use of RW and simulated grab bars.  Her husband was in for education as well and let him assist her with practicing transfer.  Close supervision for transfer in and out of the shower.  Pt's husband is able to assist but needs mod instructional cueing to position himself close enough to keep her safe in the event of a LOB.  Pt ambulated to and from the room with min guard assist.  Pt left in recliner with spouse present.  He plans to come back in tomorrow in the am for therapies and education with all disciplines starting at 9:30.    Therapy Documentation Precautions:  Precautions Precautions: Fall Precaution Comments: RUE hemiparesis greater than LE Restrictions Weight Bearing Restrictions: No  Pain:  No report of pain during sessiion  ADL: See Function Navigator for Current Functional  Status.   Therapy/Group: Individual Therapy  Karen Bowman OTR/L 07/13/2015, 3:50 PM

## 2015-07-13 NOTE — Progress Notes (Signed)
Speech Language Pathology Daily Session Note  Patient Details  Name: Karen Bowman MRN: [id number] Date of Birth: [birth year]  Today's Date: 07/13/2015 SLP Individual Time: 1330-1400 SLP Individual Time Calculation (min): 30 min  Short Term Goals: Week 2: SLP Short Term Goal 1 (Week 2): STG=LTG due to remaining length of stay   Skilled Therapeutic Interventions: Skilled treatment session focused on coginitive goals. SLP facilitated session by providing mod I with complex problem solving tasks. Pt was 100% intelligible throughout session at the conversation level independently. She frequently monitiored for listener understanding. She independently recalled strategies introduced in previous session and implemented them appropriate without prompting from SLP. Patient left upright in recliner with all needs within reach. Continue with current plan of care.   Function:  Eating Eating Eating activity did not occur: N/A Modified Consistency Diet: No Eating Assist Level: Set up assist for   Eating Set Up Assist For: Opening containers       Cognition Comprehension Comprehension assist level: Follows complex conversation/direction with extra time/assistive device  Expression   Expression assist level: Expresses complex ideas: With extra time/assistive device  Social Interaction Social Interaction assist level: Interacts appropriately with others - No medications needed.  Problem Solving Problem solving assist level: Solves complex problems: With extra time  Memory Memory assist level: More than reasonable amount of time    Pain Pain Assessment Pain Assessment: No/denies pain  Therapy/Group: Individual Therapy  Himani Corona 07/13/2015, 3:55 PM

## 2015-07-13 NOTE — Progress Notes (Signed)
Social Work Patient ID: Karen Bowman, female   DOB: 1940/07/27, 75 y.o.   MRN: [id number]   CSW met with pt to update her on team conference discussion from 07-11-15.  Pt is on target for d/c on 07-15-15 and she feels ready to go.  Her home is prepared and her husband has received family education with the therapists.  Pt would like to start with [facility] therapies and transition to outpt therapies.  She will also need a rolling walker and CSW will take care of both referrals.  Pt is very appreciative of the staff and is pleased with her progress.  CSW will continue to follow to continue support of pt and assist with any other needs.

## 2015-07-13 NOTE — Progress Notes (Addendum)
Physical Therapy Session Note  Patient Details  Name: Karen Bowman MRN: [id number] Date of Birth: 1941-03-04  Today's Date: 07/13/2015 PT Individual Time:0900  - 1000    Short Term Goals:  Week 2:  PT Short Term Goal 1 (Week 2): STG's equal LTG's due to estimated LOS.   Skilled Therapeutic Interventions/Progress Updates:   Gait on level tile to/from gym, RW.  [place] A exs, 1# RLE, 2: RLE for long arc quad knee ext only, x 10 each. Hand out issued.  Instructed pt in diaphragmatic breathing and activating core in sitting by no leaning into back of seat.  Gait x 40' toward stairs; up/down 8 3" high steps with RW, forwards both ways.  1 VC for safe placement of RW ascending, 2 VCS when descending.  Pt tends to step down without wheels being in full contact with step, and R foot had poor clearance when descending.  10MWT with RW, 22 seconds (no heel lift).  Due to functional leg length discrepancy, due to tilted pelvis/scoliosis, pt issued L heel lift for trial.  Gait analysis = reduced R genu valgus with improved R knee extension during SLS.  Pt advised to inform  therapists if R knee pain increases with L heel lift, and remove it, if so.     PT left sitting in recliner with all needs within reach.    Therapy Documentation Precautions:  Precautions Precautions: Fall Precaution Comments: RUE hemiparesis greater than LE Restrictions Weight Bearing Restrictions: No  Pain: none reported      See Function Navigator for Current Functional Status.   Therapy/Group: Individual Therapy  [name] 07/13/2015, 9:22 AM

## 2015-07-13 NOTE — Progress Notes (Signed)
Subjective/Complaints: No new complaints. Feeding self this morning. Has difficulties using right hand for feeding.  ROS- denies CP, SOB, N/V/D, urinary problems,   Objective: Vital Signs: Blood pressure 149/53, pulse 56, temperature 99.1 F (37.3 C), temperature source Oral, resp. rate 16, height 5\' 5"  (1.651 m), weight 59 kg (130 lb 1.1 oz), SpO2 98 %. No results found. No results found for this or any previous visit (from the past 72 hour(s)).   HEENT: Normocephalic, atraumatic Cardio: RRR and no murmur Resp: CTA B/L and unlabored GI: BS positive and NT, ND Skin:   Intact. Warm and dry. Neuro: Confused Motor 2 Right deltoid, 4 - R biceps and triceps and 3+ grip, 4-R HF, KE, 3- ankle DF, Abnormal FMC Ataxic/ dec FMC and Dysarthric Musc/Skel:  Thigh ROM improved. no pain today with PROM. No edema.  Gen NAD. vital signs reviewed.  Assessment/Plan: 1. Functional deficits secondary to Left basal ganglia lacunar infarct causing R Hemiparesis and dysarthria which require 3+ hours per day of interdisciplinary therapy in a comprehensive inpatient rehab setting. Physiatrist is providing close team supervision and 24 hour management of active medical problems listed below. Physiatrist and rehab team continue to assess barriers to discharge/monitor patient progress toward functional and medical goals. FIM: Function - Bathing Position: Shower Body parts bathed by patient: Right arm, Chest, Abdomen, Front perineal area, Buttocks, Right upper leg, Left upper leg, Right lower leg, Left lower leg, Left arm Body parts bathed by helper: Back, Left arm Bathing not applicable: Chest, Abdomen, Front perineal area, Buttocks, Right upper leg, Left upper leg, Left lower leg, Right lower leg Assist Level:  (6/10, 60%, moderate assist)  Function- Upper Body Dressing/Undressing What is the patient wearing?: Bra, Pull over shirt/dress Bra - Perfomed by patient: Thread/unthread right bra strap,  Thread/unthread left bra strap Bra - Perfomed by helper: Hook/unhook bra (pull down sports bra) Pull over shirt/dress - Perfomed by patient: Thread/unthread right sleeve, Thread/unthread left sleeve, Put head through opening, Pull shirt over trunk Pull over shirt/dress - Perfomed by helper: Put head through opening, Pull shirt over trunk Assist Level: Touching or steadying assistance(Pt > 75%) Function - Lower Body Dressing/Undressing What is the patient wearing?: Pants, Underwear, Ted Hose, Shoes Position:  (seated in recliner) Underwear - Performed by patient: Thread/unthread left underwear leg, Thread/unthread right underwear leg, Pull underwear up/down Underwear - Performed by helper: Pull underwear up/down, Thread/unthread right underwear leg Pants- Performed by patient: Thread/unthread right pants leg, Thread/unthread left pants leg Pants- Performed by helper: Pull pants up/down Shoes - Performed by patient: Don/doff right shoe, Don/doff left shoe Shoes - Performed by helper: Fasten right, Fasten left TED Hose - Performed by helper: Don/doff right TED hose, Don/doff left TED hose Assist for footwear: Maximal assist Assist for lower body dressing:  (4/8 tasks for FIM, 50%, moderate assist)  Function - Toileting Toileting steps completed by patient: Adjust clothing prior to toileting, Performs perineal hygiene, Adjust clothing after toileting Toileting steps completed by helper: Adjust clothing after toileting Toileting Assistive Devices: Grab bar or rail Assist level: Supervision or verbal cues  Function - Toilet Transfers Toilet transfer assistive device: Elevated toilet seat/BSC over toilet, Grab bar Assist level to toilet: Supervision or verbal cues Assist level from toilet: Supervision or verbal cues Assist level to bedside commode (at bedside): Moderate assist (Pt 50 - 74%/lift or lower) Assist level from bedside commode (at bedside): Moderate assist (Pt 50 - 74%/lift or  lower)  Function - Chair/bed transfer Chair/bed transfer method:  Stand pivot Chair/bed transfer assist level: Supervision or verbal cues Chair/bed transfer assistive device: Armrests Chair/bed transfer details: Verbal cues for technique, Verbal cues for precautions/safety, Verbal cues for safe use of DME/AE  Function - Locomotion: Wheelchair Will patient use wheelchair at discharge?: Yes Type: Manual Max wheelchair distance: 100 ft Assist Level: Supervision or verbal cues Assist Level: Supervision or verbal cues Turns around,maneuvers to table,bed, and toilet,negotiates 3% grade,maneuvers on rugs and over doorsills: No Function - Locomotion: Ambulation Assistive device: Walker-rolling Max distance: 170 Assist level: Supervision or verbal cues Assist level: Supervision or verbal cues Assist level: Supervision or verbal cues Walk 150 feet activity did not occur: Safety/medical concerns Assist level: Supervision or verbal cues Assist level: Maximal assist (Pt 25 - 49%)  Function - Comprehension Comprehension: Auditory Comprehension assist level: Understands complex 90% of the time/cues 10% of the time  Function - Expression Expression: Verbal Expression assist level: Expresses complex ideas: With extra time/assistive device  Function - Social Interaction Social Interaction assist level: Interacts appropriately with others - No medications needed.  Function - Problem Solving Problem solving assist level: Solves complex 90% of the time/cues < 10% of the time  Function - Memory Memory assist level: Recognizes or recalls 90% of the time/requires cueing < 10% of the time Patient normally able to recall (first 3 days only): Current season, Location of own room, Staff names and faces, That he or she is in a hospital   Medical Problem List and Plan: 1.  Right facial weakness and right hemiparesis with inability to carry out ADL tasks as well as gait disorder secondary to left basal  ganglia stroke.   Continue therapies   2.  DVT Prophylaxis/Anticoagulation: Pharmaceutical: Other (comment)-eliquis 3. Pain Management: tylenol prn for pain  PRN Tylenol 4. Mood: Very motivated. LCSW to follow for evaluation and support.   5. Neuropsych: This patient is capable of making decisions on his own behalf. 6. Skin/Wound Care: Routine pressure relief measures.   7. Fluids/Electrolytes/Nutrition: good appetite 8. Paroxymal A fib: Monitor heart rate bid. Monitor for symptoms with increase in activity level. Continue metoprolol 25 mg bid and amiodarone 100 mg daily.   9. Dyslipidemia: Continue lipitor. 10. HTN: monitor BP bid. Continue metoprolol.  norvasc 5mg  started on 4/6---improved control 11. Chronic systolic CHF: Off lasix. Question angioedema with ARB therefore not on ACE.  , no clinical signs of edema weight stable at 59 KG--heart healthy diet.  Continue metoprolol and statin.   10 IBS-D:  probiotic.  Diet mgt.  12.  Spasticity- qhs tizanidine effective  Continue ROM  LOS (Days) 10 A FACE TO FACE EVALUATION WAS PERFORMED  Karen Bowman T 07/13/2015, 8:24 AM

## 2015-07-14 ENCOUNTER — Inpatient Hospital Stay (HOSPITAL_COMMUNITY): Payer: Medicare PPO | Admitting: Speech Pathology

## 2015-07-14 ENCOUNTER — Inpatient Hospital Stay (HOSPITAL_COMMUNITY): Payer: Medicare PPO | Admitting: Occupational Therapy

## 2015-07-14 ENCOUNTER — Ambulatory Visit (HOSPITAL_COMMUNITY): Payer: Self-pay | Admitting: Physical Therapy

## 2015-07-14 MED ORDER — TIZANIDINE HCL 2 MG PO TABS: 2.0000 mg | ORAL_TABLET | Freq: Every day | ORAL | Status: DC

## 2015-07-14 MED ORDER — SACCHAROMYCES BOULARDII 250 MG PO CAPS: 250.0000 mg | ORAL_CAPSULE | Freq: Two times a day (BID) | ORAL | Status: DC

## 2015-07-14 MED ORDER — AMLODIPINE BESYLATE 2.5 MG PO TABS: 5.0000 mg | ORAL_TABLET | Freq: Every day | ORAL | Status: DC

## 2015-07-14 MED ORDER — ATORVASTATIN CALCIUM 40 MG PO TABS: 40.0000 mg | ORAL_TABLET | Freq: Every day | ORAL | Status: DC

## 2015-07-14 MED ORDER — LORATADINE 10 MG PO TABS: 10.0000 mg | ORAL_TABLET | Freq: Every day | ORAL | Status: DC

## 2015-07-14 MED ORDER — AMIODARONE HCL 200 MG PO TABS: 100.0000 mg | ORAL_TABLET | Freq: Every day | ORAL | Status: DC

## 2015-07-14 NOTE — Progress Notes (Signed)
Physical Therapy Discharge Summary  Patient Details  Name: Karen Bowman MRN: [id number] Date of Birth: 08-22-1940  Today's Date: 07/14/2015 PT Individual Time: 1100-1200 PT Individual Time Calculation (min): 60 min    Patient has met 8 of 8 long term goals due to improved activity tolerance, improved balance, ability to compensate for deficits, functional use of  right upper extremity and improved awareness.  Patient to discharge at an ambulatory level supervision/mod I.   Patient's care partner has been present during some therapy sessions to observe and pt feels very confident that he will be able to provide sufficient supervision.   Recommendation:  Patient will benefit from ongoing skilled PT services in home health setting to continue to advance safe functional mobility, address ongoing impairments in balance, awareness, and strength, and minimize fall risk.  Equipment: RW  Reasons for discharge: treatment goals met  Patient/family agrees with progress made and goals achieved: Yes   Skilled Therapeutic Discharge: Pt received resting in recliner and agreeable to therapy session with no c/o pain.  Session focus on pt self directed care for transfers, gait, and stair negotiation.  Pt performing transfers mod I and ambulation mod I.  Pt able to self correct errors in gait pattern including increasing RLE step length and improved R knee control during stance phase.  Stair negotiation 2x8 steps with RW ascending/descending forwards with supervision and pt self directing advancing of RW and correct sequencing of LE advancing.  Pt returned to room at end of session and positioned with call bell in reach and needs met.   PT Discharge Precautions/Restrictions Precautions Precaution Comments: RUE hemiparesis greater than LE Restrictions Weight Bearing Restrictions: No Pain Pain Assessment Pain Assessment: No/denies pain Vision/Perception  Perception Comments: Pt sometimes attends to  RUE but other times needs verbal cues to grasp walker  Cognition Arousal/Alertness: Awake/alert Orientation Level: Oriented X4 Awareness: Impaired Awareness Impairment: Anticipatory impairment Safety/Judgment: Appears intact  Mobility Bed Mobility Bed Mobility: Supine to Sit;Sit to Supine Supine to Sit: 6: Modified independent (Device/Increase time) Sit to Supine: 6: Modified independent (Device/Increase time) Transfers Transfers: Yes Sit to Stand: 6: Modified independent (Device/Increase time) Stand to Sit: 6: Modified independent (Device/Increase time) Stand Pivot Transfers: 6: Modified independent (Device/Increase time) Locomotion  Ambulation Ambulation: Yes Ambulation/Gait Assistance: 6: Modified independent (Device/Increase time) Ambulation Distance (Feet): 150 Feet Assistive device: Rolling walker Gait Gait: Yes Gait Pattern: Impaired Gait Pattern: Step-through pattern;Decreased step length - right;Lateral hip instability;Decreased stance time - right;Decreased stride length;Right flexed knee in stance Stairs / Additional Locomotion Stairs: Yes Stairs Assistance: 5: Supervision Stairs Assistance Details (indicate cue type and reason): Pt able to verbalize pattern for ascending/descending stairs with RW to therapist, therapist solely providing supervision for safety Stair Management Technique: With walker;No rails Number of Stairs: 16 Height of Stairs: 3 Wheelchair Mobility Wheelchair Mobility: No  Trunk/Postural Assessment  Cervical Assessment Cervical Assessment: Within Functional Limits Thoracic Assessment Thoracic Assessment: Exceptions to [facility] (mild kyphotic flexion in thoracic spine) Lumbar Assessment Lumbar Assessment: Exceptions to [facility] (posterior pelvic tilt) Postural Control Protective Responses: delayed  Balance Balance Balance Assessed: Yes Static Standing Balance Static Standing - Balance Support: Right upper extremity supported;Left upper extremity  supported;During functional activity Static Standing - Level of Assistance: 6: Modified independent (Device/Increase time) Dynamic Standing Balance Dynamic Standing - Balance Support: Right upper extremity supported;Left upper extremity supported;During functional activity Dynamic Standing - Level of Assistance: 5: Stand by assistance Extremity Assessment      RLE Assessment RLE Assessment: Exceptions to  WFL RLE AROM (degrees) RLE Overall AROM Comments: WFL assessed in sitting RLE Strength RLE Overall Strength: Deficits Right Hip Flexion: 3+/5 Right Knee Flexion: 4+/5 Right Knee Extension: 4+/5 Right Ankle Dorsiflexion: 5/5 Right Ankle Plantar Flexion: 5/5 LLE Assessment LLE Assessment: Exceptions to WFL LLE AROM (degrees) LLE Overall AROM Comments: WFL assessed in sitting LLE Strength Left Hip Flexion: 3+/5 Left Knee Flexion: 4+/5 Left Knee Extension: 4+/5 Left Ankle Dorsiflexion: 5/5 Left Ankle Plantar Flexion: 5/5   See Function Navigator for Current Functional Status.  Chinedu Agustin E Penven-Crew 07/14/2015, 12:15 PM

## 2015-07-14 NOTE — Progress Notes (Signed)
Speech Language Pathology Discharge Summary  Patient Details  Name: Karen Bowman MRN: [id number] Date of Birth: 1940/06/28  Today's Date: 07/14/2015 SLP Individual Time: 0900-0950  SLP Individual Time Calculation (min): 50 min   Skilled Therapeutic Interventions:  Pt was seen for skilled ST targeting goals for cognition and ongoing pt education.  SLP administered alternate version of the MoCA (version B with written portions omitted due to RUE weakness; roughly equivalent to MoCA Blind with alternate testing items) to measure progress from initial evaluation.  Pt scored a 20 out of 22 on assessment which is a 5 point improvement from initial evaluation.  Score judged to be grossly [facility] given norms for standard version of MoCA Blind are >/= 18 out of 22.  SLP reviewed and reinforced memory compensatory strategies with pt and provided her with a handout to go over with husband given that he was not present during today's therapy session.  SLP recommended brief ST follow up initially once discharged home to further assist in medication and financial management.  Pt in agreement with recommended plan of care.  All questions answered to her satisfaction at this time.  Pt left in recliner with call bell within reach.  All education is complete at this time.      Patient has met 4 of 4 long term goals.  Patient to discharge at overall Modified Independent level.  Reasons goals not met: n/a   Clinical Impression/Discharge Summary:  Pt made functional gains and is discharging at an overall mod I level for semi-complex cognitive tasks due to decreased recall of new information and decreased anticipatory awareness.  Pt is also mod I for intelligibility at the conversational level.  Pt is discharging home with 24/7 supervision from family.  Recommend brief ST follow up at next level of care to continue to address higher level cognitive function in order to facilitate successful return to medication and  financial management at discharge.  Pt and family education is complete at this time.    Care Partner:  Caregiver Able to Provide Assistance: Yes  Type of Caregiver Assistance: Physical;Cognitive  Recommendation:  Home Health SLP  Rationale for SLP Follow Up: Maximize cognitive function and independence;Maximize functional communication   Equipment: none recommended by SLP    Reasons for discharge: Discharged from hospital   Patient/Family Agrees with Progress Made and Goals Achieved: Yes   Function:  Eating Eating                 Cognition Comprehension Comprehension assist level: Follows complex conversation/direction with extra time/assistive device  Expression   Expression assist level: Expresses complex ideas: With extra time/assistive device  Social Interaction Social Interaction assist level: Interacts appropriately with others - No medications needed.  Problem Solving Problem solving assist level: Solves complex 90% of the time/cues < 10% of the time  Memory Memory assist level: More than reasonable amount of time   Windell Moulding L 07/14/2015, 11:46 AM

## 2015-07-14 NOTE — Progress Notes (Signed)
Occupational Therapy Discharge Summary  Patient Details  Name: Karen Bowman MRN: [id number] Date of Birth: 02/12/1941  Today's Date: 07/14/2015 OT Individual Time: 1001-1101 OT Individual Time Calculation (min): 60 min    Session Note:  Pt completed bathing and dressing overall supervision level except for fastening her bra.  She was able to donn her socks this session as MD stated TEDs were no longer needed.  When doing this she was able to use her RUE as an active assist with supervision.  She still demonstrates proximal shoulder weakness compared to distal function at the fingers.  Educated pt on shoulder, arm, and hand exercises and coordination activities to be performed daily at home.  Also provided handout for reference.  Pt's husband was scheduled to be at therapy this am but did not attend.    Patient has met 10 of 11 long term goals due to improved activity tolerance, improved balance, postural control, ability to compensate for deficits, functional use of  RIGHT upper extremity, improved awareness and improved coordination.  Patient to discharge at overall Supervision level.  Patient's care partner is independent to provide the necessary physical and cognitive assistance at discharge.    Reasons goals not met: Pt continues to need min assist for donning a bra during UB dressing tasks.   Recommendation:  Patient will benefit from ongoing skilled OT services in home health setting to continue to advance functional skills in the area of BADL, iADL and Reduce care partner burden. Karen Bowman continues to demonstrate greater proximal weakness in her RUE compared to distal movement making selfcare tasks more dependent and more limited.  Feel with continued HHOT she can further progress RUE function back to a non-dominant level with further transition to outpatient.  In addition, she continues to need use of the RW for support with all transfers and selfcare tasks.  Feel this will continue  to improve as well with further therapy via home health.     Equipment: RW  Reasons for discharge: treatment goals met and discharge from hospital  Patient/family agrees with progress made and goals achieved: Yes  OT Discharge Precautions/Restrictions  Precautions Precautions: Fall Precaution Comments: RUE hemiparesis greater than LE Restrictions Weight Bearing Restrictions: No  Pain  No report of pain during session  ADL  See Function Section of chart for details  Vision/Perception  Vision- History Baseline Vision/History: Wears glasses Wears Glasses: Reading only Patient Visual Report: No change from baseline Vision- Assessment Vision Assessment?: No apparent visual deficits Perception Comments: Pt with slight right inattention of the UE and LE regarding positioning during sit to stand and stand to sit transitions.    Cognition Overall Cognitive Status: Impaired/Different from baseline Arousal/Alertness: Awake/alert Orientation Level: Oriented X4 Attention: Selective Sustained Attention: Appears intact Selective Attention: Appears intact Memory: Impaired Memory Impairment: Storage deficit;Decreased recall of new information Awareness: Impaired Awareness Impairment: Anticipatory impairment Problem Solving: Impaired Problem Solving Impairment: Functional complex Safety/Judgment: Appears intact Comments: Pt still needs cueing 25% of the time in order to position the RUE before and after transfers.   Sensation Sensation Light Touch: Appears Intact Stereognosis: Appears Intact Hot/Cold: Appears Intact Proprioception: Appears Intact Coordination Gross Motor Movements are Fluid and Coordinated: No Fine Motor Movements are Fluid and Coordinated: No Coordination and Movement Description: Pt uses the RUE with selfcare tasks with supervision for bathing but min assist for dressing, in order to pull up pants or brief over her hips.  Noted greater function in the elbow,  hand, wrist, and   digits with the most difficulty noted with shoulder function.   Motor  Motor Motor: Hemiplegia;Abnormal postural alignment and control Mobility  Transfers Transfers: Sit to Stand;Stand to Sit Sit to Stand: 6: Modified independent (Device/Increase time) Stand to Sit: 6: Modified independent (Device/Increase time)  Trunk/Postural Assessment  Cervical Assessment Cervical Assessment: Within Functional Limits Thoracic Assessment Thoracic Assessment: Exceptions to WFL (thoracic rounding) Lumbar Assessment Lumbar Assessment: Exceptions to WFL (posterior pelvic tilt)  Balance Dynamic Sitting Balance Dynamic Sitting - Balance Support: No upper extremity supported Dynamic Sitting - Level of Assistance: 6: Modified independent (Device/Increase time) Static Standing Balance Static Standing - Balance Support: Right upper extremity supported;Left upper extremity supported Static Standing - Level of Assistance: 6: Modified independent (Device/Increase time) Dynamic Standing Balance Dynamic Standing - Balance Support: Right upper extremity supported Dynamic Standing - Level of Assistance: 5: Stand by assistance Extremity/Trunk Assessment RUE Assessment RUE Assessment: Exceptions to WFL RUE Strength RUE Overall Strength Comments: Pt demonstrates Brunnstrum stage V movement in the right arm and hand.  Still with limited shoulder flexion to approximately 70 degrees in syndergy pattern, however isolated movement is noted at the elbow wrist and digits.  She is able to oppose the thumb to all digits as well.  She still needs min to mod assist to incorporate into functional tasks such as dressing or self feeding.  LUE Assessment LUE Assessment: Within Functional Limits   See Function Navigator for Current Functional Status.  MCGUIRE,JAMES OTR/L 07/14/2015, 4:30 PM  

## 2015-07-14 NOTE — Progress Notes (Signed)
Subjective/Complaints: Good appetite. Wants right arm to "work better". Tolerating therapies.  ROS- denies CP, SOB, N/V/D, urinary problems,   Objective: Vital Signs: Blood pressure 127/50, pulse 60, temperature 97.7 F (36.5 C), temperature source Oral, resp. rate 18, height 5\' 5"  ([id number] m), weight 58.5 kg (128 lb 15.5 oz), SpO2 100 %. No results found. No results found for this or any previous visit (from the past 72 hour(s)).   HEENT: Normocephalic, atraumatic Cardio: RRR and no murmur Resp: CTA B/L and unlabored GI: BS positive and NT, ND Skin:   Intact. Warm and dry. Neuro: Confused Motor 2 Right deltoid, 4 - R biceps and triceps and 3+ grip, 4-R HF, KE, 3- ankle DF, Abnormal FMC Ataxic/ dec FMC and Dysarthric Musc/Skel:  Thigh ROM improved. no pain today with PROM. No edema.  Gen NAD. vital signs reviewed.  Assessment/Plan: 1. Functional deficits secondary to Left basal ganglia lacunar infarct causing R Hemiparesis and dysarthria which require 3+ hours per day of interdisciplinary therapy in a comprehensive inpatient rehab setting. Physiatrist is providing close team supervision and 24 hour management of active medical problems listed below. Physiatrist and rehab team continue to assess barriers to discharge/monitor patient progress toward functional and medical goals. FIM: Function - Bathing Position: Shower Body parts bathed by patient: Right arm, Chest, Abdomen, Front perineal area, Buttocks, Right upper leg, Left upper leg, Right lower leg, Left lower leg, Left arm Body parts bathed by helper: Back, Left arm Bathing not applicable: Chest, Abdomen, Front perineal area, Buttocks, Right upper leg, Left upper leg, Left lower leg, Right lower leg Assist Level: Touching or steadying assistance(Pt > 75%)  Function- Upper Body Dressing/Undressing What is the patient wearing?: Bra, Pull over shirt/dress Bra - Perfomed by patient: Thread/unthread right bra strap, Thread/unthread  left bra strap Bra - Perfomed by helper: Hook/unhook bra (pull down sports bra) Pull over shirt/dress - Perfomed by patient: Thread/unthread right sleeve, Thread/unthread left sleeve, Put head through opening, Pull shirt over trunk Pull over shirt/dress - Perfomed by helper: Put head through opening, Pull shirt over trunk Assist Level: Touching or steadying assistance(Pt > 75%) Function - Lower Body Dressing/Undressing What is the patient wearing?: Pants, Underwear, Ted Hose, Shoes Position:  (seated in recliner) Underwear - Performed by patient: Thread/unthread left underwear leg, Thread/unthread right underwear leg, Pull underwear up/down Underwear - Performed by helper: Pull underwear up/down, Thread/unthread right underwear leg Pants- Performed by patient: Thread/unthread right pants leg, Thread/unthread left pants leg, Pull pants up/down Pants- Performed by helper: Pull pants up/down Shoes - Performed by patient: Don/doff right shoe, Don/doff left shoe Shoes - Performed by helper: Don/doff right shoe, Don/doff left shoe, Fasten right, Fasten left TED Hose - Performed by helper: Don/doff right TED hose, Don/doff left TED hose Assist for footwear: Maximal assist Assist for lower body dressing: Touching or steadying assistance (Pt > 75%)  Function - Toileting Toileting steps completed by patient: Adjust clothing prior to toileting, Performs perineal hygiene, Adjust clothing after toileting Toileting steps completed by helper: Adjust clothing after toileting Toileting Assistive Devices: Grab bar or rail Assist level: Supervision or verbal cues  Function - Air cabin crew transfer assistive device: Elevated toilet seat/BSC over toilet, Grab bar Assist level to toilet: Supervision or verbal cues Assist level from toilet: Supervision or verbal cues Assist level to bedside commode (at bedside): Moderate assist (Pt 50 - 74%/lift or lower) Assist level from bedside commode (at  bedside): Moderate assist (Pt 50 - 74%/lift or lower)  Function -  Chair/bed transfer Chair/bed transfer method: Stand pivot Chair/bed transfer assist level: Supervision or verbal cues Chair/bed transfer assistive device: Armrests Chair/bed transfer details: Verbal cues for safe use of DME/AE  Function - Locomotion: Wheelchair Will patient use wheelchair at discharge?: Yes Type: Manual Max wheelchair distance: 100 ft Assist Level: Supervision or verbal cues Assist Level: Supervision or verbal cues Turns around,maneuvers to table,bed, and toilet,negotiates 3% grade,maneuvers on rugs and over doorsills: No Function - Locomotion: Ambulation Assistive device: Walker-rolling Max distance: 170 Assist level: Supervision or verbal cues Assist level: Supervision or verbal cues Assist level: Supervision or verbal cues Walk 150 feet activity did not occur: Safety/medical concerns Assist level: Supervision or verbal cues Assist level: Maximal assist (Pt 25 - 49%)  Function - Comprehension Comprehension: Auditory Comprehension assist level: Follows complex conversation/direction with extra time/assistive device  Function - Expression Expression: Verbal Expression assist level: Expresses complex ideas: With extra time/assistive device  Function - Social Interaction Social Interaction assist level: Interacts appropriately with others - No medications needed.  Function - Problem Solving Problem solving assist level: Solves complex problems: With extra time  Function - Memory Memory assist level: More than reasonable amount of time Patient normally able to recall (first 3 days only): Current season, Location of own room, Staff names and faces, That he or she is in a hospital   Medical Problem List and Plan: 1.  Right facial weakness and right hemiparesis with inability to carry out ADL tasks as well as gait disorder secondary to left basal ganglia stroke.   -making continuous gains 2.   DVT Prophylaxis/Anticoagulation: remains on eliquis 3. Pain Management: tylenol prn for pain  PRN Tylenol 4. Mood: Very motivated. LCSW to follow for evaluation and support.   5. Neuropsych: This patient is capable of making decisions on his own behalf. 6. Skin/Wound Care: Routine pressure relief measures.   7. Fluids/Electrolytes/Nutrition: good appetite 8. Paroxymal A fib: Monitor heart rate bid. Monitor for symptoms with increase in activity level. Continue metoprolol 25 mg bid and amiodarone 100 mg daily.   9. Dyslipidemia: Continue lipitor. 10. HTN: monitor BP bid. Continue metoprolol.  norvasc 5mg  started on 4/6--may need further titration--observe for now 11. Chronic systolic CHF: Off lasix. Question angioedema with ARB therefore not on ACE.  , no clinical signs of edema weight stable at 59 KG--heart healthy diet.  Continue metoprolol and statin.   10 IBS-D:  probiotic.  Diet mgt.  12.  Spasticity- qhs tizanidine effective  Continue ROM  LOS (Days) 11 A FACE TO FACE EVALUATION WAS PERFORMED  Cynai Skeens T 07/14/2015, 8:23 AM

## 2015-07-14 NOTE — Discharge Instructions (Signed)
Inpatient Rehab Discharge Instructions  Karen Bowman Discharge date and time:  07/15/15  Activities/Precautions/ Functional Status: Activity: no lifting, driving, or strenuous exercise  till cleared by MD.  Diet: cardiac diet Wound Care: none needed   Functional status:  ___ No restrictions     ___ Walk up steps independently ___ 24/7 supervision/assistance   _X__ Walk up steps with assistance _X__ Intermittent supervision/assistance  ___ Bathe/dress independently _X__ Walk with walker    _X__ Bathe/dress with assistance ___ Walk Independently    ___ Shower independently ___ Walk with assistance    ___ Shower with assistance _X__ No alcohol     ___ Return to work/school ________    COMMUNITY REFERRALS UPON DISCHARGE:   Home Health:   PT     OT    ST    SNA     Agency:  [facility] Phone:  [phone] Medical Equipment/Items Ordered:  Rolling walker  Agency/Supplier:  [facility]        Phone:  [phone]   GENERAL COMMUNITY RESOURCES FOR PATIENT/FAMILY: Support Group:  [facility] Stroke Support Group                            Meets the 2nd Thursday of the month at [facility]                            In the dayroom of [facility], [facility] Neuropsychologist:  You saw Dr. Marlane Hatcher, PsyD while in the hospital.  Here is her information if you want to follow up with her for more in depth testing, should you have concerns.                                  [facility], [place]                                  [place], [place]  [address]                                  [phone]    Special Instructions:  STROKE/TIA DISCHARGE INSTRUCTIONS SMOKING Cigarette smoking nearly doubles your risk of having a stroke & is the single most alterable risk factor  If you smoke or have smoked in the last 12 months, you are advised to quit smoking for your health.  Most of the excess  cardiovascular risk related to smoking disappears within a year of stopping.  Ask you doctor about anti-smoking medications  [place] Quit Line: 1-800-QUIT NOW  Free Smoking Cessation Classes [phone]  CHOLESTEROL Know your levels; limit fat & cholesterol in your diet  Lipid Panel     Component Value Date/Time   CHOL 276* 06/28/2015 0310   TRIG 57 06/28/2015 0310   HDL 70 06/28/2015 0310   CHOLHDL 3.9 06/28/2015 0310   VLDL 11 06/28/2015 0310   LDLCALC 195* 06/28/2015 0310      Many patients benefit from treatment even  if their cholesterol is at goal.  Goal: Total Cholesterol (CHOL) less than 160  Goal:  Triglycerides (TRIG) less than 150  Goal:  HDL greater than 40  Goal:  LDL (LDLCALC) less than 100   BLOOD PRESSURE American Stroke Association blood pressure target is less that 120/80 mm/Hg  Your discharge blood pressure is:  BP: (!) 134/56 mmHg  Monitor your blood pressure  Limit your salt and alcohol intake  Many individuals will require more than one medication for high blood pressure  DIABETES (A1c is a blood sugar average for last 3 months) Goal HGBA1c is under 7% (HBGA1c is blood sugar average for last 3 months)  Diabetes: No known diagnosis of diabetes    Lab Results  Component Value Date   HGBA1C 5.7* 06/28/2015     Your HGBA1c can be lowered with medications, healthy diet, and exercise.  Check your blood sugar as directed by your physician  Call your physician if you experience unexplained or low blood sugars.  PHYSICAL ACTIVITY/REHABILITATION Goal is 30 minutes at least 4 days per week  Activity: No driving, Therapies: see above Return to work:  N/A  Activity decreases your risk of heart attack and stroke and makes your heart stronger.  It helps control your weight and blood pressure; helps you relax and can improve your mood.  Participate in a regular exercise program.  Talk with your doctor about the best form of exercise for you (dancing,  walking, swimming, cycling).  DIET/WEIGHT Goal is to maintain a healthy weight  Your discharge diet is: Diet Heart Room service appropriate?: Yes; Fluid consistency:: Thin  liquids Your height is:  Height: 5\' 5"  (165.1 cm) Your current weight is: Weight: 58.5 kg (128 lb 15.5 oz) Your Body Mass Index (BMI) is:  BMI (Calculated): 21.4  Following the type of diet specifically designed for you will help prevent another stroke.  You are at goal weight.  Your goal Body Mass Index (BMI) is 19-24.  Healthy food habits can help reduce 3 risk factors for stroke:  High cholesterol, hypertension, and excess weight.  RESOURCES Stroke/Support Group:  Call [phone]   STROKE EDUCATION PROVIDED/REVIEWED AND GIVEN TO PATIENT Stroke warning signs and symptoms How to activate emergency medical system (call 911). Medications prescribed at discharge. Need for follow-up after discharge. Personal risk factors for stroke. Pneumonia vaccine given:  Flu vaccine given:  My questions have been answered, the writing is legible, and I understand these instructions.  I will adhere to these goals & educational materials that have been provided to me after my discharge from the hospital.      My questions have been answered and I understand these instructions. I will adhere to these goals and the provided educational materials after my discharge from the hospital.  Patient/Caregiver Signature _______________________________ Date __________  Clinician Signature _______________________________________ Date __________  Please bring this form and your medication list with you to all your follow-up doctor's appointments.

## 2015-07-14 NOTE — Discharge Summary (Signed)
Physician Discharge Summary  Patient ID: Karen Bowman MRN: [id number] DOB/AGE: 09-03-1940 75 y.o.  Admit date: 07/03/2015 Discharge date: 07/15/2015  Discharge Diagnoses:  Principal Problem:   Hemiparesis affecting dominant side as late effect of stroke ([place]) Active Problems:   CVA (cerebral infarction)   Gait disturbance, post-stroke   Paroxysmal atrial fibrillation (HCC)   Dyslipidemia   Benign essential HTN   Chronic systolic congestive heart failure (HCC)   IBS (irritable bowel syndrome)   Muscle spasticity   Muscle pain   Discharged Condition: Stable.   Labs:  Basic Metabolic Panel: BMP Latest Ref Rng 07/04/2015 06/28/2015 06/28/2015  Glucose 65 - 99 mg/dL 89 118(H) -  BUN 6 - 20 mg/dL 17 10 -  Creatinine 0.44 - 1.00 mg/dL 0.72 0.66 0.68  Sodium 135 - 145 mmol/L 141 138 -  Potassium 3.5 - 5.1 mmol/L 3.9 3.7 -  Chloride 101 - 111 mmol/L 106 104 -  CO2 22 - 32 mmol/L 25 23 -  Calcium 8.9 - 10.3 mg/dL 9.2 9.2 -     CBC: CBC Latest Ref Rng 07/04/2015 07/03/2015 06/28/2015  WBC 4.0 - 10.5 K/uL 5.0 5.5 5.5  Hemoglobin 12.0 - 15.0 g/dL 13.4 14.2 13.4  Hematocrit 36.0 - 46.0 % 41.2 42.4 40.8  Platelets 150 - 400 K/uL 196 192 191    Brief HPI:   Karen Bowman is a 75 y.o. right handed female with history of HTN, chronic systolic congestive heart failure, A fib- on Eliquis; who was admitted on 06/28/2015 with right-sided weakness and slurred speech. MRI of the brain showed acute left basal ganglia infarct. MRA of the head was negative for large vessel occlusion or stenosis. Patient did not receive TPA. Echocardiogram with ejection fraction 60% no wall motion abnormalities. CTA angiogram of head and neck showed severe R-P2 PCA stenosis. Dr. [name] felt that stroke likely due to small vessel disease and low dose ASA added for secondary stroke prevention. She is tolerating a regular diet but continues to have mild dysarthria with right facial weakness, RUE inattention and right sided  weakness. CIR was recommended for follow up therapy.   Hospital Course: Karen Bowman was admitted to rehab 07/03/2015 for inpatient therapies to consist of PT, ST and OT at least three hours five days a week. Past admission physiatrist, therapy team and rehab RN have worked together to provide customized collaborative inpatient rehab. Blood pressures were monitored bid and Norvasc was increased to 5 mg daily for better control. Heart rate have been well controlled metoprolol bid and amiodarone daily. Weights have been monitored daily has been stable at 58.5 kg. No signs of overload noted off lasix.  Protein supplement was added to help with low protein stores. Labs done showed renal status to be stable and LFTs WNL. She has developed spasticity in RLE and was started on tizanidine with improvement in ROM as well as control of muscle spasms.  Mood has been stable and she is continent of bowel and bladder.  She has made good progress during her rehab stay and is modified independent to supervision at discharge. She will continue to receive follow up HHPT, HHST and HHOT by [place] after discharge.    Rehab course: During patient's stay in rehab weekly team conferences were held to monitor patient's progress, set goals and discuss barriers to discharge. At admission, patient required moderate assistance with mobility and basic self-care tasks. She demonstrated mild dysarthria and had mild high level cognitive deficits impacting problem  solving, memory and awareness of right visual field deficits. She has had improvement in activity tolerance, balance, postural control, as well as ability to compensate for deficits. She is has had improvement in functional use RUE  and RLE as well as improvement in awareness.  She requires min assist to intergrade RUE for bathing and is able to complete dressing tasks with min-guard assist. She is able to perform transfers and is ambulating 150' with RW at modified  independent level. She requires supervision to navigate 16 stairs.  She is able to complete complex problems solving tasks independently and is utilizing memory strategies independently. Speech is 100% intelligible. Family education ws done with husband who will provide supervision after discharge.     Disposition:  Home   Diet: Heart Healthy.   Special Instructions: 1. No driving or strenuous till cleared by MD.     Medication List    STOP taking these medications        BEE POLLEN PO     betamethasone dipropionate 0.05 % cream  Commonly known as:  DIPROLENE     DANDELION PO     estradiol 0.1 MG/GM vaginal cream  Commonly known as:  ESTRACE     HYALURONIC ACID PO     L-GLUTAMINE PO     MILK THISTLE PO     Probiotic Caps     QUERCETIN PO     ROYAL JELLY PO     RUTIN PO      TAKE these medications        amiodarone 200 MG tablet  Commonly known as:  PACERONE  Take 0.5 tablets (100 mg total) by mouth daily.     amLODipine 2.5 MG tablet  Commonly known as:  NORVASC  Take 1 tablet (2.5 mg total) by mouth daily.     apixaban 5 MG Tabs tablet  Commonly known as:  ELIQUIS  Take 1 tablet (5 mg total) by mouth 2 (two) times daily.     aspirin 81 MG EC tablet  Take 1 tablet (81 mg total) by mouth daily.     atorvastatin 40 MG tablet  Commonly known as:  LIPITOR  Take 1 tablet (40 mg total) by mouth daily at 6 PM.     Cholecalciferol 1000 units capsule  Take 2,000 Units by mouth daily.     CINNAMON PO  Take 2 tablets by mouth daily.     CO-Q 10 Omega-3 Fish Oil Caps  Take 1 capsule by mouth 2 (two) times daily.     CRANBERRY PO  Take 1 tablet by mouth 2 (two) times daily.     EYEBRIGHT PO  Take 1 tablet by mouth daily.     loratadine 10 MG tablet  Commonly known as:  CLARITIN  Take 1 tablet (10 mg total) by mouth daily.     metoprolol tartrate 25 MG tablet  Commonly known as:  LOPRESSOR  Take 0.5 tablets (12.5 mg total) by mouth 2 (two) times  daily.     MISC NATURAL PRODUCTS PO  Take 1 tablet by mouth daily. Multivitamin-Dr. Talmage Coin     OVER THE COUNTER MEDICATION  Take 2 tablets by mouth daily. Omega Q Plus w/resveratrol-Dr. Talmage Coin     polyethylene glycol packet  Commonly known as:  MIRALAX / GLYCOLAX  Take 17 g by mouth daily.     saccharomyces boulardii 250 MG capsule  Commonly known as:  FLORASTOR  Take 1 capsule (250 mg total) by mouth 2 (two) times  daily.     tiZANidine 2 MG tablet  Commonly known as:  ZANAFLEX  Take 1 tablet (2 mg total) by mouth at bedtime.           Follow-up Information    Follow up with Charlett Blake, MD.   Specialty:  Physical Medicine and Rehabilitation   Why:  office will call you with follow up appointment   Contact information:   [address] [place] [address] [phone]       Follow up with Xu,Jindong, MD. Call today.   Specialty:  Neurology   Why:  for follow up appointment   Contact information:   [address] [place] [address] [phone]       Follow up with [name], MD On 07/21/2015.   Specialty:  Family Medicine   Why:  @ 10 AM   Contact information:   [place] [address] [phone]       Signed: Bary Leriche 07/14/2015, 4:04 PM

## 2015-07-14 NOTE — Progress Notes (Signed)
Speech Language Pathology Daily Session Note  Patient Details  Name: Karen Bowman MRN: [id number] Date of Birth: January 24, 1941  Today's Date: 07/14/2015 SLP Individual Time: 1415-1500 SLP Individual Time Calculation (min): 45 min  Short Term Goals: Week 2: SLP Short Term Goal 1 (Week 2): STG=LTG due to remaining length of stay   Skilled Therapeutic Interventions: Skilled ST session focused on cognition goals. SLP facilitated session by providing Mod I on complex problem solving tasks. Pt able to independently maintain attention to task in mildly distracting environment. Pt able to recall 3 words with distraction while navigating advertisements. Pt's husband was present during session and education provided on memory strategies to promote functional independence within the home. Pt able to recall handout given to her by previous SLP. Pt returned to room, left in recliner with all needs within reach and husband present. Continue with plan of care.   Function:  Eating Eating                 Cognition Comprehension Comprehension assist level: Follows complex conversation/direction with extra time/assistive device  Expression   Expression assist level: Expresses complex ideas: With extra time/assistive device  Social Interaction Social Interaction assist level: Interacts appropriately with others - No medications needed.  Problem Solving Problem solving assist level: Solves basic 90% of the time/requires cueing < 10% of the time  Memory Memory assist level: More than reasonable amount of time    Pain Pain Assessment Pain Assessment: No/denies pain  Therapy/Group: Individual Therapy  Icholas Irby 07/14/2015, 3:12 PM

## 2015-07-14 NOTE — Plan of Care (Signed)
Problem: RH Dressing Goal: LTG Patient will perform upper body dressing (OT) LTG Patient will perform upper body dressing with assist, with/without cues (OT).  Outcome: Not Met (add Reason) Needs min assist for bra

## 2015-07-15 NOTE — Progress Notes (Signed)
Pt discharged to home with husband. Discharge instructions given to pt by Algis Liming, PA with verbal understanding. Belongings with pt.

## 2015-07-15 NOTE — Progress Notes (Signed)
Subjective/Complaints: In good spirits. No questions today. Excited to go home!  ROS- denies CP, SOB, N/V/D ,   Objective: Vital Signs: Blood pressure 133/48, pulse 54, temperature 97.6 F (36.4 C), temperature source Oral, resp. rate 18, height 5' 5" (1.651 m), weight 60 kg (132 lb 4.4 oz), SpO2 99 %. No results found. No results found for this or any previous visit (from the past 72 hour(s)).   HEENT: Normocephalic, atraumatic Cardio: RRR and no murmur Resp: CTA B/L and unlabored GI: BS positive and NT, ND Skin:   Intact. Warm and dry. Neuro: Confused Motor 2 Right deltoid, 4 - R biceps and triceps and 3+ grip, 4-R HF, KE, 3- ankle DF, Abnormal FMC Ataxic/ dec FMC and Dysarthric Musc/Skel:  Thigh ROM improved. no pain today with PROM. No edema.  Gen NAD. vital signs reviewed.  Assessment/Plan: 1. Functional deficits secondary to Left basal ganglia lacunar infarct causing R Hemiparesis and dysarthria which require 3+ hours per day of interdisciplinary therapy in a comprehensive inpatient rehab setting. Physiatrist is providing close team supervision and 24 hour management of active medical problems listed below. Physiatrist and rehab team continue to assess barriers to discharge/monitor patient progress toward functional and medical goals. FIM: Function - Bathing Position: Shower Body parts bathed by patient: Right arm, Chest, Abdomen, Front perineal area, Buttocks, Right upper leg, Left upper leg, Right lower leg, Left lower leg, Left arm Body parts bathed by helper: Back, Left arm Bathing not applicable: Back Assist Level: Supervision or verbal cues  Function- Upper Body Dressing/Undressing What is the patient wearing?: Bra, Pull over shirt/dress Bra - Perfomed by patient: Thread/unthread right bra strap, Thread/unthread left bra strap Bra - Perfomed by helper: Hook/unhook bra (pull down sports bra) Pull over shirt/dress - Perfomed by patient: Thread/unthread right sleeve,  Thread/unthread left sleeve, Put head through opening, Pull shirt over trunk Pull over shirt/dress - Perfomed by helper: Put head through opening, Pull shirt over trunk Assist Level: Touching or steadying assistance(Pt > 75%) Function - Lower Body Dressing/Undressing What is the patient wearing?: Pants, Underwear, Socks, Shoes Position:  (seated in recliner) Underwear - Performed by patient: Thread/unthread left underwear leg, Thread/unthread right underwear leg, Pull underwear up/down Underwear - Performed by helper: Pull underwear up/down, Thread/unthread right underwear leg Pants- Performed by patient: Thread/unthread right pants leg, Thread/unthread left pants leg, Pull pants up/down Pants- Performed by helper: Pull pants up/down Socks - Performed by patient: Don/doff right sock, Don/doff left sock Shoes - Performed by patient: Don/doff right shoe, Don/doff left shoe, Fasten right, Fasten left Shoes - Performed by helper: Don/doff right shoe, Don/doff left shoe, Fasten right, Fasten left TED Hose - Performed by helper: Don/doff right TED hose, Don/doff left TED hose Assist for footwear: Maximal assist Assist for lower body dressing: Supervision or verbal cues  Function - Toileting Toileting steps completed by patient: Adjust clothing prior to toileting, Performs perineal hygiene, Adjust clothing after toileting Toileting steps completed by helper: Adjust clothing after toileting Toileting Assistive Devices: Grab bar or rail Assist level: Supervision or verbal cues  Function - Air cabin crew transfer assistive device: Elevated toilet seat/BSC over toilet, Grab bar Assist level to toilet: Supervision or verbal cues Assist level from toilet: Supervision or verbal cues Assist level to bedside commode (at bedside): Moderate assist (Pt 50 - 74%/lift or lower) Assist level from bedside commode (at bedside): Moderate assist (Pt 50 - 74%/lift or lower)  Function - Chair/bed  transfer Chair/bed transfer method: Ambulatory Chair/bed transfer assist  level: No Help, no cues, assistive device, takes more than a reasonable amount of time Chair/bed transfer assistive device: Armrests, Walker Chair/bed transfer details: Verbal cues for safe use of DME/AE  Function - Locomotion: Wheelchair Will patient use wheelchair at discharge?: No Type: Manual Max wheelchair distance: 100 ft Assist Level: Supervision or verbal cues Assist Level: Supervision or verbal cues Turns around,maneuvers to table,bed, and toilet,negotiates 3% grade,maneuvers on rugs and over doorsills: No Function - Locomotion: Ambulation Assistive device: Walker-rolling Max distance: 160 Assist level: No help, No cues, assistive device, takes more than a reasonable amount of time Assist level: No help, No cues, assistive device, takes more than a reasonable amount of time Assist level: No help, No cues, assistive device, takes more than a reasonable amount of time Walk 150 feet activity did not occur: Safety/medical concerns Assist level: No help, No cues, assistive device, takes more than a reasonable amount of time Assist level: Supervision or verbal cues  Function - Comprehension Comprehension: Auditory Comprehension assist level: Follows complex conversation/direction with extra time/assistive device  Function - Expression Expression: Verbal Expression assist level: Expresses complex ideas: With extra time/assistive device  Function - Social Interaction Social Interaction assist level: Interacts appropriately with others - No medications needed.  Function - Problem Solving Problem solving assist level: Solves basic 90% of the time/requires cueing < 10% of the time  Function - Memory Memory assist level: More than reasonable amount of time Patient normally able to recall (first 3 days only): Current season, Location of own room, Staff names and faces, That he or she is in a  hospital   Medical Problem List and Plan: 1.  Right facial weakness and right hemiparesis with inability to carry out ADL tasks as well as gait disorder secondary to left basal ganglia stroke.   -home today. Goals met  Patient to see md in the office for transitional care encounter in 1-2 weeks. 2.  DVT Prophylaxis/Anticoagulation: remains on eliquis 3. Pain Management: tylenol prn for pain  PRN Tylenol 4. Mood: Very motivated. LCSW to follow for evaluation and support.   5. Neuropsych: This patient is capable of making decisions on his own behalf. 6. Skin/Wound Care: Routine pressure relief measures.   7. Fluids/Electrolytes/Nutrition: good appetite 8. Paroxymal A fib: Monitor heart rate bid. Monitor for symptoms with increase in activity level. Continue metoprolol 25 mg bid and amiodarone 100 mg daily.   9. Dyslipidemia: Continue lipitor. 10. HTN: monitor BP bid. Continue metoprolol.  norvasc 61m started on 4/6--may need further titration--observe for now 11. Chronic systolic CHF: Off lasix. Question angioedema with ARB therefore not on ACE.  , no clinical signs of edema weight stable at 59 KG--heart healthy diet.  Continue metoprolol and statin.   10 IBS-D:  probiotic.  Diet mgt.  12.  Spasticity- qhs tizanidine effective  Continue ROM  LOS (Days) 12 A FACE TO FACE EVALUATION WAS PERFORMED  SWARTZ,ZACHARY T 07/15/2015, 8:01 AM

## 2015-07-17 ENCOUNTER — Telehealth: Payer: Self-pay

## 2015-07-17 DIAGNOSIS — I480 Paroxysmal atrial fibrillation: Secondary | ICD-10-CM | POA: Diagnosis not present

## 2015-07-17 DIAGNOSIS — I69351 Hemiplegia and hemiparesis following cerebral infarction affecting right dominant side: Secondary | ICD-10-CM | POA: Diagnosis not present

## 2015-07-17 DIAGNOSIS — I110 Hypertensive heart disease with heart failure: Secondary | ICD-10-CM | POA: Diagnosis not present

## 2015-07-17 DIAGNOSIS — I69318 Other symptoms and signs involving cognitive functions following cerebral infarction: Secondary | ICD-10-CM | POA: Diagnosis not present

## 2015-07-17 DIAGNOSIS — I5022 Chronic systolic (congestive) heart failure: Secondary | ICD-10-CM | POA: Diagnosis not present

## 2015-07-17 DIAGNOSIS — I252 Old myocardial infarction: Secondary | ICD-10-CM | POA: Diagnosis not present

## 2015-07-17 DIAGNOSIS — M1990 Unspecified osteoarthritis, unspecified site: Secondary | ICD-10-CM | POA: Diagnosis not present

## 2015-07-17 DIAGNOSIS — F419 Anxiety disorder, unspecified: Secondary | ICD-10-CM | POA: Diagnosis not present

## 2015-07-17 DIAGNOSIS — I69322 Dysarthria following cerebral infarction: Secondary | ICD-10-CM | POA: Diagnosis not present

## 2015-07-17 NOTE — Telephone Encounter (Signed)
Initial TCM call  Transition Care Management Follow-up Telephone Call     Date discharged? 07/15/2015        How have you been since you were released from the hospital? Still recovering   Any patient concerns? None at this time   Do you understand why you were in the hospital? Yes   Do you understand the discharge instructions? Yes   Where were you discharged to? Home   Items Reviewed:  Medications reviewed: Yes  Allergies reviewed: Yes  Dietary changes reviewed:  Yes  Referrals reviewed: Yes   Functional Questionnaire:  Independent - I Dependent - D    Activities of Daily Living (ADLs):    Personal hygiene - D (uses grab bars and shower chair) Dressing -  D (spouse when needed) Eating - I Maintaining continence - I  Transferring - D (ambulates and transfers with walker)  Independent Activities of Daily Living (ADLs): Basic communication skills - I Transportation - D (driving restrictions) Meal preparation - D (spouse) Shopping - D (spouse) Housework - D (housekeeper) Managing medications - I Managing personal finances - I   Confirmed importance and date/time of follow-up visits scheduled YES  Provider Appointment booked with PCP 07/21/15 @ 10AM   Confirmed with patient if condition begins to worsen call PCP or go to the ER.  Patient was given the office number and encouraged to call back with question or concerns: YES

## 2015-07-18 DIAGNOSIS — I110 Hypertensive heart disease with heart failure: Secondary | ICD-10-CM | POA: Diagnosis not present

## 2015-07-18 DIAGNOSIS — I69351 Hemiplegia and hemiparesis following cerebral infarction affecting right dominant side: Secondary | ICD-10-CM | POA: Diagnosis not present

## 2015-07-18 DIAGNOSIS — I5022 Chronic systolic (congestive) heart failure: Secondary | ICD-10-CM | POA: Diagnosis not present

## 2015-07-18 DIAGNOSIS — M1990 Unspecified osteoarthritis, unspecified site: Secondary | ICD-10-CM | POA: Diagnosis not present

## 2015-07-18 DIAGNOSIS — I252 Old myocardial infarction: Secondary | ICD-10-CM | POA: Diagnosis not present

## 2015-07-18 DIAGNOSIS — I69318 Other symptoms and signs involving cognitive functions following cerebral infarction: Secondary | ICD-10-CM | POA: Diagnosis not present

## 2015-07-18 DIAGNOSIS — I480 Paroxysmal atrial fibrillation: Secondary | ICD-10-CM | POA: Diagnosis not present

## 2015-07-18 DIAGNOSIS — F419 Anxiety disorder, unspecified: Secondary | ICD-10-CM | POA: Diagnosis not present

## 2015-07-18 DIAGNOSIS — I69322 Dysarthria following cerebral infarction: Secondary | ICD-10-CM | POA: Diagnosis not present

## 2015-07-19 DIAGNOSIS — I110 Hypertensive heart disease with heart failure: Secondary | ICD-10-CM | POA: Diagnosis not present

## 2015-07-19 DIAGNOSIS — I69318 Other symptoms and signs involving cognitive functions following cerebral infarction: Secondary | ICD-10-CM | POA: Diagnosis not present

## 2015-07-19 DIAGNOSIS — I69322 Dysarthria following cerebral infarction: Secondary | ICD-10-CM | POA: Diagnosis not present

## 2015-07-19 DIAGNOSIS — M1990 Unspecified osteoarthritis, unspecified site: Secondary | ICD-10-CM | POA: Diagnosis not present

## 2015-07-19 DIAGNOSIS — I69351 Hemiplegia and hemiparesis following cerebral infarction affecting right dominant side: Secondary | ICD-10-CM | POA: Diagnosis not present

## 2015-07-19 DIAGNOSIS — F419 Anxiety disorder, unspecified: Secondary | ICD-10-CM | POA: Diagnosis not present

## 2015-07-19 DIAGNOSIS — I480 Paroxysmal atrial fibrillation: Secondary | ICD-10-CM | POA: Diagnosis not present

## 2015-07-19 DIAGNOSIS — I252 Old myocardial infarction: Secondary | ICD-10-CM | POA: Diagnosis not present

## 2015-07-19 DIAGNOSIS — I5022 Chronic systolic (congestive) heart failure: Secondary | ICD-10-CM | POA: Diagnosis not present

## 2015-07-20 DIAGNOSIS — I69351 Hemiplegia and hemiparesis following cerebral infarction affecting right dominant side: Secondary | ICD-10-CM | POA: Diagnosis not present

## 2015-07-20 DIAGNOSIS — I252 Old myocardial infarction: Secondary | ICD-10-CM | POA: Diagnosis not present

## 2015-07-20 DIAGNOSIS — M1990 Unspecified osteoarthritis, unspecified site: Secondary | ICD-10-CM | POA: Diagnosis not present

## 2015-07-20 DIAGNOSIS — F419 Anxiety disorder, unspecified: Secondary | ICD-10-CM | POA: Diagnosis not present

## 2015-07-20 DIAGNOSIS — I480 Paroxysmal atrial fibrillation: Secondary | ICD-10-CM | POA: Diagnosis not present

## 2015-07-20 DIAGNOSIS — I5022 Chronic systolic (congestive) heart failure: Secondary | ICD-10-CM | POA: Diagnosis not present

## 2015-07-20 DIAGNOSIS — I69318 Other symptoms and signs involving cognitive functions following cerebral infarction: Secondary | ICD-10-CM | POA: Diagnosis not present

## 2015-07-20 DIAGNOSIS — I69322 Dysarthria following cerebral infarction: Secondary | ICD-10-CM | POA: Diagnosis not present

## 2015-07-20 DIAGNOSIS — I110 Hypertensive heart disease with heart failure: Secondary | ICD-10-CM | POA: Diagnosis not present

## 2015-07-21 ENCOUNTER — Ambulatory Visit (INDEPENDENT_AMBULATORY_CARE_PROVIDER_SITE_OTHER): Payer: Medicare PPO | Admitting: Family Medicine

## 2015-07-21 ENCOUNTER — Encounter: Payer: Self-pay | Admitting: Family Medicine

## 2015-07-21 VITALS — BP 140/70 | HR 67 | Temp 98.2°F | Ht 65.0 in | Wt 129.1 lb

## 2015-07-21 DIAGNOSIS — I110 Hypertensive heart disease with heart failure: Secondary | ICD-10-CM | POA: Diagnosis not present

## 2015-07-21 DIAGNOSIS — I69318 Other symptoms and signs involving cognitive functions following cerebral infarction: Secondary | ICD-10-CM | POA: Diagnosis not present

## 2015-07-21 DIAGNOSIS — I5022 Chronic systolic (congestive) heart failure: Secondary | ICD-10-CM | POA: Diagnosis not present

## 2015-07-21 DIAGNOSIS — M6249 Contracture of muscle, multiple sites: Secondary | ICD-10-CM

## 2015-07-21 DIAGNOSIS — F419 Anxiety disorder, unspecified: Secondary | ICD-10-CM | POA: Diagnosis not present

## 2015-07-21 DIAGNOSIS — I10 Essential (primary) hypertension: Secondary | ICD-10-CM

## 2015-07-21 DIAGNOSIS — M62838 Other muscle spasm: Secondary | ICD-10-CM

## 2015-07-21 DIAGNOSIS — I480 Paroxysmal atrial fibrillation: Secondary | ICD-10-CM | POA: Diagnosis not present

## 2015-07-21 DIAGNOSIS — I69322 Dysarthria following cerebral infarction: Secondary | ICD-10-CM | POA: Diagnosis not present

## 2015-07-21 DIAGNOSIS — I6350 Cerebral infarction due to unspecified occlusion or stenosis of unspecified cerebral artery: Secondary | ICD-10-CM | POA: Diagnosis not present

## 2015-07-21 DIAGNOSIS — I69359 Hemiplegia and hemiparesis following cerebral infarction affecting unspecified side: Secondary | ICD-10-CM

## 2015-07-21 DIAGNOSIS — M1990 Unspecified osteoarthritis, unspecified site: Secondary | ICD-10-CM | POA: Diagnosis not present

## 2015-07-21 DIAGNOSIS — E782 Mixed hyperlipidemia: Secondary | ICD-10-CM | POA: Diagnosis not present

## 2015-07-21 DIAGNOSIS — I252 Old myocardial infarction: Secondary | ICD-10-CM | POA: Diagnosis not present

## 2015-07-21 DIAGNOSIS — I69351 Hemiplegia and hemiparesis following cerebral infarction affecting right dominant side: Secondary | ICD-10-CM | POA: Diagnosis not present

## 2015-07-21 MED ORDER — CHLORTHALIDONE 25 MG PO TABS: 12.5000 mg | ORAL_TABLET | Freq: Every day | ORAL | Status: DC | PRN

## 2015-07-21 MED ORDER — LORATADINE 10 MG PO TABS: 10.0000 mg | ORAL_TABLET | Freq: Every day | ORAL | Status: DC

## 2015-07-21 MED ORDER — ATORVASTATIN CALCIUM 40 MG PO TABS: 40.0000 mg | ORAL_TABLET | Freq: Every day | ORAL | Status: DC

## 2015-07-21 NOTE — Patient Instructions (Addendum)
Do not use dandelion root for swelling as it can interfere with liver. Can instead use low dose of chlorthalidone as needed for swelling in ankles. Okay to use zanaflex for muscle spasm at night but try to use only as needed.  Continue atorvastatin daily for cholesterol.

## 2015-07-21 NOTE — Assessment & Plan Note (Signed)
Re-eval in 3 months on new start lipitor.

## 2015-07-21 NOTE — Assessment & Plan Note (Signed)
Now on eliquis and asa.  nees better cholesterol control, pt agreeable and on lipitor.  Receiving PT/OT and ST, improving.

## 2015-07-21 NOTE — Assessment & Plan Note (Signed)
Well controlled. Continue current medication.  

## 2015-07-21 NOTE — Progress Notes (Signed)
Subjective:    Patient ID: Karen Bowman, female    DOB: 1940-07-16, 75 y.o.   MRN: [id number]  HPI  75 year old female presennt for hospital follow up following CVA. To   Admitted on 3/28 to 4/3, followed by rehab at [facility] from 4/3 to 4/15  TCM call made. She has been home less than a week.  Discharge Diagnoses:  Principal Problem:  Cerebrovascular acident (CVA) due to occlusion of cerebral artery (HCC) Active Problems:  HYPERLIPIDEMIA  HYPERTENSION, BENIGN ESSENTIAL  A-fib (HCC)  Chronic systolic heart failure (HCC)  Stroke (cerebrum) [facility])  Hospital Course:  Summary of her active problems in the hospital is as following. 1. Cerebrovascular accident (CVA) due to occlusion of cerebral artery ([place]) Right basal ganglia infarct on CT scan. Stroke swallowing test was successfully passed by the patient. Moderate to severe bilateral proximal PCA stenosis no further workup recommended from neurology Echocardiogram ejection fraction 0000000, diastolic dysfunction  LDL 195 hemoglobin A1c 5.7 Lipitor 40 mg added  Neurology consult recommends to add aspirin to patient's Eliquis. PTOT recommends CIR. speech evaluation for language will be performed at the CIR. Patient is gradually improving regarding her weakness  2. Chronic A. Fib Chronic combined CHF Essential hypertension. Continue amlodipine, continue beta blocker. Rate currently controlled. Continue Eliquis. Continue amiodarone  3. Constipation. Continue MiraLAX  4. Over-the-counter medications. The patient is taking multiple over-the-counter medication which she would like to continue on discharge. Recommended patient to an sure safety and efficacy as well as avoid interaction. Recommended her to always make sure that she discuss all her medication with the pharmacy due to her being on oral anticoagulation as well as aspirin   Recommendations for Outpatient Follow-up:  1. Please follow-up with PCP in one  week. 2. Please follow-up with neurology as scheduled. 3. Please get speech therapy evaluation at the CIR   Today: pt reports  right side facial weakness, right upper and lower ext weakness as well as difficulty expressing words  Is improving  Gradually. Getting PT/OT and ST.  Constipation resolved, no longer using miralax.  No side effects to new cholesterol medication.  BP Readings from Last 3 Encounters:  07/21/15 140/70  07/15/15 133/48  07/03/15 135/91   occ uses dandelion root for intermittant peripheral edema.. Wants to restart.  She is keeping her spirits up overall.  Review of Systems  Constitutional: Negative for fever and fatigue.  HENT: Negative for ear pain.   Eyes: Negative for pain.  Respiratory: Negative for chest tightness and shortness of breath.   Cardiovascular: Negative for chest pain, palpitations and leg swelling.  Gastrointestinal: Negative for abdominal pain.  Genitourinary: Negative for dysuria.       Objective:   Physical Exam  Constitutional: Vital signs are normal. She appears well-developed and well-nourished. She is cooperative.  Non-toxic appearance. She does not appear ill. No distress.  HENT:  Head: Normocephalic.  Right Ear: Hearing, tympanic membrane, external ear and ear canal normal. Tympanic membrane is not erythematous, not retracted and not bulging.  Left Ear: Hearing, tympanic membrane, external ear and ear canal normal. Tympanic membrane is not erythematous, not retracted and not bulging.  Nose: No mucosal edema or rhinorrhea. Right sinus exhibits no maxillary sinus tenderness and no frontal sinus tenderness. Left sinus exhibits no maxillary sinus tenderness and no frontal sinus tenderness.  Mouth/Throat: Uvula is midline, oropharynx is clear and moist and mucous membranes are normal.  Right facial weakness and  Slightly slurred speech, right eye  drop but full lid closure.  Eyes: Conjunctivae, EOM and lids are normal. Pupils are  equal, round, and reactive to light. Lids are everted and swept, no foreign bodies found.  Neck: Trachea normal and normal range of motion. Neck supple. Carotid bruit is not present. No thyroid mass and no thyromegaly present.  Cardiovascular: Normal rate, regular rhythm, S1 normal, S2 normal, normal heart sounds, intact distal pulses and normal pulses.  Exam reveals no gallop and no friction rub.   No murmur heard. Pulmonary/Chest: Effort normal and breath sounds normal. No tachypnea. No respiratory distress. She has no decreased breath sounds. She has no wheezes. She has no rhonchi. She has no rales.  Abdominal: Soft. Normal appearance and bowel sounds are normal. There is no tenderness.  Neurological: She is alert. A cranial nerve deficit is present. No sensory deficit. She exhibits abnormal muscle tone. Gait abnormal.   Using walker, 4/5 weakness in right upper and lower ext.  Skin: Skin is warm, dry and intact. No rash noted.  Psychiatric: Her speech is normal and behavior is normal. Judgment and thought content normal. Her mood appears not anxious. Cognition and memory are normal. She does not exhibit a depressed mood.          Assessment & Plan:

## 2015-07-21 NOTE — Progress Notes (Signed)
Pre visit review using our clinic review tool, if applicable. No additional management support is needed unless otherwise documented below in the visit note. 

## 2015-07-21 NOTE — Assessment & Plan Note (Signed)
IMproving gradually.

## 2015-07-21 NOTE — Assessment & Plan Note (Signed)
Followed by cardiology. Rate controlled on amiodarone.

## 2015-07-21 NOTE — Assessment & Plan Note (Signed)
Try to wean off muscle relaxant but okay to refill prn.

## 2015-07-21 NOTE — Assessment & Plan Note (Addendum)
Euvolemic and recent ECHo improved.  No current swelling but pt used to use dandelion root for swelling.. Will given chlorthalidone to use prn swelling in future given increase liver issue with dandelion and statin.

## 2015-07-24 ENCOUNTER — Other Ambulatory Visit: Payer: Self-pay | Admitting: *Deleted

## 2015-07-24 ENCOUNTER — Telehealth: Payer: Self-pay | Admitting: Family

## 2015-07-24 DIAGNOSIS — I252 Old myocardial infarction: Secondary | ICD-10-CM | POA: Diagnosis not present

## 2015-07-24 DIAGNOSIS — I69322 Dysarthria following cerebral infarction: Secondary | ICD-10-CM | POA: Diagnosis not present

## 2015-07-24 DIAGNOSIS — F419 Anxiety disorder, unspecified: Secondary | ICD-10-CM | POA: Diagnosis not present

## 2015-07-24 DIAGNOSIS — I110 Hypertensive heart disease with heart failure: Secondary | ICD-10-CM | POA: Diagnosis not present

## 2015-07-24 DIAGNOSIS — I69318 Other symptoms and signs involving cognitive functions following cerebral infarction: Secondary | ICD-10-CM | POA: Diagnosis not present

## 2015-07-24 DIAGNOSIS — I5022 Chronic systolic (congestive) heart failure: Secondary | ICD-10-CM | POA: Diagnosis not present

## 2015-07-24 DIAGNOSIS — M1990 Unspecified osteoarthritis, unspecified site: Secondary | ICD-10-CM | POA: Diagnosis not present

## 2015-07-24 DIAGNOSIS — I69351 Hemiplegia and hemiparesis following cerebral infarction affecting right dominant side: Secondary | ICD-10-CM | POA: Diagnosis not present

## 2015-07-24 DIAGNOSIS — I480 Paroxysmal atrial fibrillation: Secondary | ICD-10-CM | POA: Diagnosis not present

## 2015-07-24 NOTE — Telephone Encounter (Signed)
Received a call from [facility] stating that Loratadine is not on the patient's formulary and they want to know if this can be changed to Levo cetirizine 5 mg. Can this be changed?  Call back [phone].

## 2015-07-24 NOTE — Telephone Encounter (Signed)
Returned patient's voicemail regarding her next appointment with [place] in June 2017. She says that she's recently had a stroke and all her energy is tied up with recovering from the stroke and she just doesn't want any extra appointments right now. Told patient that I would cancel her appointment and she could call us back when she was ready to reschedule. Patient was appreciative.

## 2015-07-25 MED ORDER — LEVOCETIRIZINE DIHYDROCHLORIDE 5 MG PO TABS: 5.0000 mg | ORAL_TABLET | Freq: Every evening | ORAL | Status: DC

## 2015-07-25 NOTE — Telephone Encounter (Signed)
Yes , okay to changes.

## 2015-07-26 DIAGNOSIS — M1990 Unspecified osteoarthritis, unspecified site: Secondary | ICD-10-CM | POA: Diagnosis not present

## 2015-07-26 DIAGNOSIS — I110 Hypertensive heart disease with heart failure: Secondary | ICD-10-CM | POA: Diagnosis not present

## 2015-07-26 DIAGNOSIS — I5022 Chronic systolic (congestive) heart failure: Secondary | ICD-10-CM | POA: Diagnosis not present

## 2015-07-26 DIAGNOSIS — I69351 Hemiplegia and hemiparesis following cerebral infarction affecting right dominant side: Secondary | ICD-10-CM | POA: Diagnosis not present

## 2015-07-26 DIAGNOSIS — F419 Anxiety disorder, unspecified: Secondary | ICD-10-CM | POA: Diagnosis not present

## 2015-07-26 DIAGNOSIS — I252 Old myocardial infarction: Secondary | ICD-10-CM | POA: Diagnosis not present

## 2015-07-26 DIAGNOSIS — I480 Paroxysmal atrial fibrillation: Secondary | ICD-10-CM | POA: Diagnosis not present

## 2015-07-26 DIAGNOSIS — I69318 Other symptoms and signs involving cognitive functions following cerebral infarction: Secondary | ICD-10-CM | POA: Diagnosis not present

## 2015-07-26 DIAGNOSIS — I69322 Dysarthria following cerebral infarction: Secondary | ICD-10-CM | POA: Diagnosis not present

## 2015-07-28 DIAGNOSIS — I69351 Hemiplegia and hemiparesis following cerebral infarction affecting right dominant side: Secondary | ICD-10-CM | POA: Diagnosis not present

## 2015-07-28 DIAGNOSIS — M1990 Unspecified osteoarthritis, unspecified site: Secondary | ICD-10-CM | POA: Diagnosis not present

## 2015-07-28 DIAGNOSIS — I480 Paroxysmal atrial fibrillation: Secondary | ICD-10-CM | POA: Diagnosis not present

## 2015-07-28 DIAGNOSIS — I69318 Other symptoms and signs involving cognitive functions following cerebral infarction: Secondary | ICD-10-CM | POA: Diagnosis not present

## 2015-07-28 DIAGNOSIS — F419 Anxiety disorder, unspecified: Secondary | ICD-10-CM | POA: Diagnosis not present

## 2015-07-28 DIAGNOSIS — I5022 Chronic systolic (congestive) heart failure: Secondary | ICD-10-CM | POA: Diagnosis not present

## 2015-07-28 DIAGNOSIS — I69322 Dysarthria following cerebral infarction: Secondary | ICD-10-CM | POA: Diagnosis not present

## 2015-07-28 DIAGNOSIS — I110 Hypertensive heart disease with heart failure: Secondary | ICD-10-CM | POA: Diagnosis not present

## 2015-07-28 DIAGNOSIS — I252 Old myocardial infarction: Secondary | ICD-10-CM | POA: Diagnosis not present

## 2015-07-31 ENCOUNTER — Encounter: Payer: Self-pay | Admitting: Physical Medicine & Rehabilitation

## 2015-07-31 ENCOUNTER — Encounter: Payer: Medicare PPO | Attending: Physical Medicine & Rehabilitation | Admitting: Physical Medicine & Rehabilitation

## 2015-07-31 VITALS — BP 157/75 | HR 65

## 2015-07-31 DIAGNOSIS — M1990 Unspecified osteoarthritis, unspecified site: Secondary | ICD-10-CM | POA: Insufficient documentation

## 2015-07-31 DIAGNOSIS — R410 Disorientation, unspecified: Secondary | ICD-10-CM | POA: Insufficient documentation

## 2015-07-31 DIAGNOSIS — K589 Irritable bowel syndrome without diarrhea: Secondary | ICD-10-CM | POA: Insufficient documentation

## 2015-07-31 DIAGNOSIS — M6249 Contracture of muscle, multiple sites: Secondary | ICD-10-CM

## 2015-07-31 DIAGNOSIS — I69359 Hemiplegia and hemiparesis following cerebral infarction affecting unspecified side: Secondary | ICD-10-CM

## 2015-07-31 DIAGNOSIS — R011 Cardiac murmur, unspecified: Secondary | ICD-10-CM | POA: Diagnosis not present

## 2015-07-31 DIAGNOSIS — R202 Paresthesia of skin: Secondary | ICD-10-CM | POA: Insufficient documentation

## 2015-07-31 DIAGNOSIS — R350 Frequency of micturition: Secondary | ICD-10-CM | POA: Diagnosis not present

## 2015-07-31 DIAGNOSIS — Z90710 Acquired absence of both cervix and uterus: Secondary | ICD-10-CM | POA: Diagnosis not present

## 2015-07-31 DIAGNOSIS — G8191 Hemiplegia, unspecified affecting right dominant side: Secondary | ICD-10-CM | POA: Insufficient documentation

## 2015-07-31 DIAGNOSIS — R269 Unspecified abnormalities of gait and mobility: Secondary | ICD-10-CM

## 2015-07-31 DIAGNOSIS — R531 Weakness: Secondary | ICD-10-CM | POA: Diagnosis not present

## 2015-07-31 DIAGNOSIS — E785 Hyperlipidemia, unspecified: Secondary | ICD-10-CM | POA: Diagnosis not present

## 2015-07-31 DIAGNOSIS — I5022 Chronic systolic (congestive) heart failure: Secondary | ICD-10-CM | POA: Diagnosis not present

## 2015-07-31 DIAGNOSIS — M549 Dorsalgia, unspecified: Secondary | ICD-10-CM | POA: Insufficient documentation

## 2015-07-31 DIAGNOSIS — I110 Hypertensive heart disease with heart failure: Secondary | ICD-10-CM | POA: Diagnosis not present

## 2015-07-31 DIAGNOSIS — R200 Anesthesia of skin: Secondary | ICD-10-CM | POA: Diagnosis not present

## 2015-07-31 DIAGNOSIS — R262 Difficulty in walking, not elsewhere classified: Secondary | ICD-10-CM | POA: Insufficient documentation

## 2015-07-31 DIAGNOSIS — I639 Cerebral infarction, unspecified: Secondary | ICD-10-CM | POA: Diagnosis not present

## 2015-07-31 DIAGNOSIS — I69398 Other sequelae of cerebral infarction: Secondary | ICD-10-CM | POA: Diagnosis not present

## 2015-07-31 DIAGNOSIS — G8929 Other chronic pain: Secondary | ICD-10-CM | POA: Insufficient documentation

## 2015-07-31 DIAGNOSIS — M25511 Pain in right shoulder: Secondary | ICD-10-CM | POA: Diagnosis not present

## 2015-07-31 DIAGNOSIS — M62838 Other muscle spasm: Secondary | ICD-10-CM

## 2015-07-31 DIAGNOSIS — I4891 Unspecified atrial fibrillation: Secondary | ICD-10-CM | POA: Insufficient documentation

## 2015-07-31 DIAGNOSIS — R29810 Facial weakness: Secondary | ICD-10-CM | POA: Insufficient documentation

## 2015-07-31 DIAGNOSIS — I499 Cardiac arrhythmia, unspecified: Secondary | ICD-10-CM | POA: Insufficient documentation

## 2015-07-31 NOTE — Progress Notes (Signed)
Subjective:    Patient ID: Karen Bowman, female    DOB: 06-30-1940, 75 y.o.   MRN: [id number]  HPI   Mrs. Cavanna is here in follow up of her left basal ganglia infarct. [place] therapy has been coming out to the house. PT, OT, and SLP have all been seeing her. She continues to see improvement which keeps her motivated. She hasn't had any falls or mishaps. She uses her walker for balance.   She has had intermittent pain in her right back/shoulder which is gradually improving. Her right leg is getting stronger, but she still has some muscle discomfort. She uses tizanidine occasionally for spasm---she tries to avoid it if she can.   She has urinary frequency at night still---it keeps her awake. She's used to it, as it's been going on prior to the CVA.       Pain Inventory Average Pain 8 Pain Right Now 6 My pain is aching  In the last 24 hours, has pain interfered with the following? General activity 7 Relation with others 10 Enjoyment of life 10 What TIME of day is your pain at its worst? daytime Sleep (in general) Good  Pain is worse with: some activites Pain improves with: rest, heat/ice, therapy/exercise, pacing activities and medication Relief from Meds: 7  Mobility walk without assistance use a walker ability to climb steps?  no do you drive?  no transfers alone  Function not employed: date last employed 1999 retired I need assistance with the following:  dressing, bathing, meal prep, household duties and shopping  Neuro/Psych weakness numbness tingling trouble walking spasms  Prior Studies Any changes since last visit?  no  Physicians involved in your care Any changes since last visit?  no   Family History  Problem Relation Age of Onset  . Osteoporosis Mother   . Heart disease Mother   . Cancer Father   . Diabetes Maternal Aunt    Social History   Social History  . Marital Status: Married    Spouse Name: N/A  . Number of Children: 2  . Years  of Education: N/A   Occupational History  . retired Network engineer    Social History Main Topics  . Smoking status: Never Smoker   . Smokeless tobacco: Never Used  . Alcohol Use: No  . Drug Use: No  . Sexual Activity: Not Asked   Other Topics Concern  . None   Social History Narrative   ** Merged History Encounter **       Exercise:walking 2-3 times a week   End of life issues: Has living will.. Husband is HCPOA. Full code (reviewed 2014)   Past Surgical History  Procedure Laterality Date  . Tonsillectomy    . Appendectomy    . Tumor removal      right arm  . Bladder suspension    . Abdominal hysterectomy    . Tee without cardioversion N/A 10/18/2014    Procedure: TRANSESOPHAGEAL ECHOCARDIOGRAM (TEE);  Surgeon: Thayer Headings, MD;  Location: [facility] ORS;  Service: Cardiovascular;  Laterality: N/A;  . Electrophysiologic study N/A 10/18/2014    Procedure: CARDIOVERSION;  Surgeon: Thayer Headings, MD;  Location: [facility] ORS;  Service: Cardiovascular;  Laterality: N/A;   Past Medical History  Diagnosis Date  . Hypertension   . Heart murmur   . Anxiety   . Arrhythmia   . Arthritis   . Atrial fibrillation ([place])   . Acute heart failure ([place])   . Hyperlipemia  BP 157/75 mmHg  Pulse 65  SpO2 98%  LMP  (LMP Unknown)  Opioid Risk Score:   Fall Risk Score:  `1  Depression screen PHQ 2/9  Depression screen [facility] 2/9 07/31/2015 03/13/2015 02/02/2015 12/12/2014 10/31/2014 02/01/2014  Decreased Interest 0 0 0 0 0 0  Down, Depressed, Hopeless 0 0 0 1 0 0  PHQ - 2 Score 0 0 0 1 0 0     Review of Systems  HENT: Negative for congestion.   Eyes: Negative for discharge.  Respiratory: Negative for chest tightness.   Cardiovascular: Negative for leg swelling.  Endocrine: Negative for cold intolerance.  Genitourinary: Negative for difficulty urinating.  Neurological: Positive for weakness and numbness.       Objective:   Physical Exam  HEENT: Normocephalic, atraumatic Cardio: RRR and  no murmur Resp: CTA B/L and unlabored GI: BS positive and NT, ND Skin: Intact. Warm and dry. Neuro: Confused Motor 3+ Right deltoid, 4 R biceps and triceps and 4 grip, 4 +R HF, KE, 3 ankle DF, Improving FMC Ataxic/ dec FMC and Dysarthric slightly Musc/Skel: Thigh ROM improved. Minimal pain today No edema. Walks favoring left leg. stends to step down into the right leg during stance.  Gen NAD. vital signs reviewed.       Assessment & Plan:  1. Right facial weakness and right hemiparesis with inability to carry out ADL tasks as well as gait disorder secondary to left basal ganglia stroke.  -continue Home Health Therapies---transition to [place] therapies---PT, OT, SLP  2. DVT Prophylaxis/Anticoagulation: remains on eliquis 3. Pain Management: tylenol prn for pain, heat and ice 4. Mood: Very motivated. LCSW to follow for evaluation and support.  5. Neuropsych: This patient is capable of making decisions on his own behalf. 6. Skin/Wound Care: Routine pressure relief measures.  7. Fluids/Electrolytes/Nutrition: good appetite now.  10. HTN:  Per primary 11. Chronic systolic CHF/afib:per primary.  12. IBS-D: probiotic. Diet mgt.  12.Spasticity- PRN qhs tizanidine effective Continue ROM with HEP  Thirty minutes of face to face patient care time were spent during this visit. All questions were encouraged and answered. Follow up in 2-3 months

## 2015-07-31 NOTE — Patient Instructions (Signed)
PLEASE CALL ME WITH ANY PROBLEMS OR QUESTIONS [id number]).     LET ME KNOW WHEN HOME HEALTH IS WINDING DOWN AND WE'LL SEND YOU TO OUTPATIENT THERAPIES.

## 2015-08-01 ENCOUNTER — Other Ambulatory Visit: Payer: Self-pay

## 2015-08-01 DIAGNOSIS — M1990 Unspecified osteoarthritis, unspecified site: Secondary | ICD-10-CM | POA: Diagnosis not present

## 2015-08-01 DIAGNOSIS — I69318 Other symptoms and signs involving cognitive functions following cerebral infarction: Secondary | ICD-10-CM | POA: Diagnosis not present

## 2015-08-01 DIAGNOSIS — I69351 Hemiplegia and hemiparesis following cerebral infarction affecting right dominant side: Secondary | ICD-10-CM | POA: Diagnosis not present

## 2015-08-01 DIAGNOSIS — I252 Old myocardial infarction: Secondary | ICD-10-CM | POA: Diagnosis not present

## 2015-08-01 DIAGNOSIS — I110 Hypertensive heart disease with heart failure: Secondary | ICD-10-CM | POA: Diagnosis not present

## 2015-08-01 DIAGNOSIS — I480 Paroxysmal atrial fibrillation: Secondary | ICD-10-CM | POA: Diagnosis not present

## 2015-08-01 DIAGNOSIS — I5022 Chronic systolic (congestive) heart failure: Secondary | ICD-10-CM | POA: Diagnosis not present

## 2015-08-01 DIAGNOSIS — I69322 Dysarthria following cerebral infarction: Secondary | ICD-10-CM | POA: Diagnosis not present

## 2015-08-01 DIAGNOSIS — F419 Anxiety disorder, unspecified: Secondary | ICD-10-CM | POA: Diagnosis not present

## 2015-08-03 DIAGNOSIS — I5022 Chronic systolic (congestive) heart failure: Secondary | ICD-10-CM | POA: Diagnosis not present

## 2015-08-03 DIAGNOSIS — M1990 Unspecified osteoarthritis, unspecified site: Secondary | ICD-10-CM | POA: Diagnosis not present

## 2015-08-03 DIAGNOSIS — I480 Paroxysmal atrial fibrillation: Secondary | ICD-10-CM | POA: Diagnosis not present

## 2015-08-03 DIAGNOSIS — I69322 Dysarthria following cerebral infarction: Secondary | ICD-10-CM | POA: Diagnosis not present

## 2015-08-03 DIAGNOSIS — I69351 Hemiplegia and hemiparesis following cerebral infarction affecting right dominant side: Secondary | ICD-10-CM | POA: Diagnosis not present

## 2015-08-03 DIAGNOSIS — F419 Anxiety disorder, unspecified: Secondary | ICD-10-CM | POA: Diagnosis not present

## 2015-08-03 DIAGNOSIS — I252 Old myocardial infarction: Secondary | ICD-10-CM | POA: Diagnosis not present

## 2015-08-03 DIAGNOSIS — I110 Hypertensive heart disease with heart failure: Secondary | ICD-10-CM | POA: Diagnosis not present

## 2015-08-03 DIAGNOSIS — I69318 Other symptoms and signs involving cognitive functions following cerebral infarction: Secondary | ICD-10-CM | POA: Diagnosis not present

## 2015-08-04 ENCOUNTER — Ambulatory Visit: Payer: Self-pay | Admitting: Family Medicine

## 2015-08-04 DIAGNOSIS — I252 Old myocardial infarction: Secondary | ICD-10-CM | POA: Diagnosis not present

## 2015-08-04 DIAGNOSIS — I69351 Hemiplegia and hemiparesis following cerebral infarction affecting right dominant side: Secondary | ICD-10-CM | POA: Diagnosis not present

## 2015-08-04 DIAGNOSIS — I480 Paroxysmal atrial fibrillation: Secondary | ICD-10-CM | POA: Diagnosis not present

## 2015-08-04 DIAGNOSIS — I5022 Chronic systolic (congestive) heart failure: Secondary | ICD-10-CM | POA: Diagnosis not present

## 2015-08-04 DIAGNOSIS — I69318 Other symptoms and signs involving cognitive functions following cerebral infarction: Secondary | ICD-10-CM | POA: Diagnosis not present

## 2015-08-04 DIAGNOSIS — I69322 Dysarthria following cerebral infarction: Secondary | ICD-10-CM | POA: Diagnosis not present

## 2015-08-04 DIAGNOSIS — I110 Hypertensive heart disease with heart failure: Secondary | ICD-10-CM | POA: Diagnosis not present

## 2015-08-04 DIAGNOSIS — F419 Anxiety disorder, unspecified: Secondary | ICD-10-CM | POA: Diagnosis not present

## 2015-08-04 DIAGNOSIS — M1990 Unspecified osteoarthritis, unspecified site: Secondary | ICD-10-CM | POA: Diagnosis not present

## 2015-08-08 DIAGNOSIS — I110 Hypertensive heart disease with heart failure: Secondary | ICD-10-CM | POA: Diagnosis not present

## 2015-08-08 DIAGNOSIS — I69351 Hemiplegia and hemiparesis following cerebral infarction affecting right dominant side: Secondary | ICD-10-CM | POA: Diagnosis not present

## 2015-08-08 DIAGNOSIS — I480 Paroxysmal atrial fibrillation: Secondary | ICD-10-CM | POA: Diagnosis not present

## 2015-08-08 DIAGNOSIS — I69322 Dysarthria following cerebral infarction: Secondary | ICD-10-CM | POA: Diagnosis not present

## 2015-08-08 DIAGNOSIS — I69318 Other symptoms and signs involving cognitive functions following cerebral infarction: Secondary | ICD-10-CM | POA: Diagnosis not present

## 2015-08-08 DIAGNOSIS — I252 Old myocardial infarction: Secondary | ICD-10-CM | POA: Diagnosis not present

## 2015-08-08 DIAGNOSIS — I5022 Chronic systolic (congestive) heart failure: Secondary | ICD-10-CM | POA: Diagnosis not present

## 2015-08-08 DIAGNOSIS — F419 Anxiety disorder, unspecified: Secondary | ICD-10-CM | POA: Diagnosis not present

## 2015-08-08 DIAGNOSIS — M1990 Unspecified osteoarthritis, unspecified site: Secondary | ICD-10-CM | POA: Diagnosis not present

## 2015-08-09 ENCOUNTER — Telehealth: Payer: Self-pay | Admitting: *Deleted

## 2015-08-09 DIAGNOSIS — I480 Paroxysmal atrial fibrillation: Secondary | ICD-10-CM | POA: Diagnosis not present

## 2015-08-09 DIAGNOSIS — I69322 Dysarthria following cerebral infarction: Secondary | ICD-10-CM | POA: Diagnosis not present

## 2015-08-09 DIAGNOSIS — M1990 Unspecified osteoarthritis, unspecified site: Secondary | ICD-10-CM | POA: Diagnosis not present

## 2015-08-09 DIAGNOSIS — I252 Old myocardial infarction: Secondary | ICD-10-CM | POA: Diagnosis not present

## 2015-08-09 DIAGNOSIS — F419 Anxiety disorder, unspecified: Secondary | ICD-10-CM | POA: Diagnosis not present

## 2015-08-09 DIAGNOSIS — I110 Hypertensive heart disease with heart failure: Secondary | ICD-10-CM | POA: Diagnosis not present

## 2015-08-09 DIAGNOSIS — I69318 Other symptoms and signs involving cognitive functions following cerebral infarction: Secondary | ICD-10-CM | POA: Diagnosis not present

## 2015-08-09 DIAGNOSIS — I5022 Chronic systolic (congestive) heart failure: Secondary | ICD-10-CM | POA: Diagnosis not present

## 2015-08-09 DIAGNOSIS — I69351 Hemiplegia and hemiparesis following cerebral infarction affecting right dominant side: Secondary | ICD-10-CM | POA: Diagnosis not present

## 2015-08-09 NOTE — Telephone Encounter (Signed)
Sherlynn Stalls, OT, [facility] is asking for verbal orders to continue OT 1week2 followed by 2week3. I called back and gave verbal orders per office protocol

## 2015-08-10 DIAGNOSIS — I69351 Hemiplegia and hemiparesis following cerebral infarction affecting right dominant side: Secondary | ICD-10-CM | POA: Diagnosis not present

## 2015-08-10 DIAGNOSIS — F419 Anxiety disorder, unspecified: Secondary | ICD-10-CM | POA: Diagnosis not present

## 2015-08-10 DIAGNOSIS — I69322 Dysarthria following cerebral infarction: Secondary | ICD-10-CM | POA: Diagnosis not present

## 2015-08-10 DIAGNOSIS — M1990 Unspecified osteoarthritis, unspecified site: Secondary | ICD-10-CM | POA: Diagnosis not present

## 2015-08-10 DIAGNOSIS — I480 Paroxysmal atrial fibrillation: Secondary | ICD-10-CM | POA: Diagnosis not present

## 2015-08-10 DIAGNOSIS — I110 Hypertensive heart disease with heart failure: Secondary | ICD-10-CM | POA: Diagnosis not present

## 2015-08-10 DIAGNOSIS — I252 Old myocardial infarction: Secondary | ICD-10-CM | POA: Diagnosis not present

## 2015-08-10 DIAGNOSIS — I5022 Chronic systolic (congestive) heart failure: Secondary | ICD-10-CM | POA: Diagnosis not present

## 2015-08-10 DIAGNOSIS — I69318 Other symptoms and signs involving cognitive functions following cerebral infarction: Secondary | ICD-10-CM | POA: Diagnosis not present

## 2015-08-11 DIAGNOSIS — I69322 Dysarthria following cerebral infarction: Secondary | ICD-10-CM | POA: Diagnosis not present

## 2015-08-11 DIAGNOSIS — I480 Paroxysmal atrial fibrillation: Secondary | ICD-10-CM | POA: Diagnosis not present

## 2015-08-11 DIAGNOSIS — I69351 Hemiplegia and hemiparesis following cerebral infarction affecting right dominant side: Secondary | ICD-10-CM | POA: Diagnosis not present

## 2015-08-11 DIAGNOSIS — I110 Hypertensive heart disease with heart failure: Secondary | ICD-10-CM | POA: Diagnosis not present

## 2015-08-11 DIAGNOSIS — M1990 Unspecified osteoarthritis, unspecified site: Secondary | ICD-10-CM | POA: Diagnosis not present

## 2015-08-11 DIAGNOSIS — I5022 Chronic systolic (congestive) heart failure: Secondary | ICD-10-CM | POA: Diagnosis not present

## 2015-08-11 DIAGNOSIS — F419 Anxiety disorder, unspecified: Secondary | ICD-10-CM | POA: Diagnosis not present

## 2015-08-11 DIAGNOSIS — I69318 Other symptoms and signs involving cognitive functions following cerebral infarction: Secondary | ICD-10-CM | POA: Diagnosis not present

## 2015-08-11 DIAGNOSIS — I252 Old myocardial infarction: Secondary | ICD-10-CM | POA: Diagnosis not present

## 2015-08-15 DIAGNOSIS — I5022 Chronic systolic (congestive) heart failure: Secondary | ICD-10-CM | POA: Diagnosis not present

## 2015-08-15 DIAGNOSIS — I69318 Other symptoms and signs involving cognitive functions following cerebral infarction: Secondary | ICD-10-CM | POA: Diagnosis not present

## 2015-08-15 DIAGNOSIS — I252 Old myocardial infarction: Secondary | ICD-10-CM | POA: Diagnosis not present

## 2015-08-15 DIAGNOSIS — I69351 Hemiplegia and hemiparesis following cerebral infarction affecting right dominant side: Secondary | ICD-10-CM | POA: Diagnosis not present

## 2015-08-15 DIAGNOSIS — F419 Anxiety disorder, unspecified: Secondary | ICD-10-CM | POA: Diagnosis not present

## 2015-08-15 DIAGNOSIS — I480 Paroxysmal atrial fibrillation: Secondary | ICD-10-CM | POA: Diagnosis not present

## 2015-08-15 DIAGNOSIS — I69322 Dysarthria following cerebral infarction: Secondary | ICD-10-CM | POA: Diagnosis not present

## 2015-08-15 DIAGNOSIS — M1990 Unspecified osteoarthritis, unspecified site: Secondary | ICD-10-CM | POA: Diagnosis not present

## 2015-08-15 DIAGNOSIS — I110 Hypertensive heart disease with heart failure: Secondary | ICD-10-CM | POA: Diagnosis not present

## 2015-08-16 DIAGNOSIS — I69318 Other symptoms and signs involving cognitive functions following cerebral infarction: Secondary | ICD-10-CM | POA: Diagnosis not present

## 2015-08-16 DIAGNOSIS — I480 Paroxysmal atrial fibrillation: Secondary | ICD-10-CM | POA: Diagnosis not present

## 2015-08-16 DIAGNOSIS — M1990 Unspecified osteoarthritis, unspecified site: Secondary | ICD-10-CM | POA: Diagnosis not present

## 2015-08-16 DIAGNOSIS — I5022 Chronic systolic (congestive) heart failure: Secondary | ICD-10-CM | POA: Diagnosis not present

## 2015-08-16 DIAGNOSIS — I252 Old myocardial infarction: Secondary | ICD-10-CM | POA: Diagnosis not present

## 2015-08-16 DIAGNOSIS — I69351 Hemiplegia and hemiparesis following cerebral infarction affecting right dominant side: Secondary | ICD-10-CM | POA: Diagnosis not present

## 2015-08-16 DIAGNOSIS — I110 Hypertensive heart disease with heart failure: Secondary | ICD-10-CM | POA: Diagnosis not present

## 2015-08-16 DIAGNOSIS — I69322 Dysarthria following cerebral infarction: Secondary | ICD-10-CM | POA: Diagnosis not present

## 2015-08-16 DIAGNOSIS — F419 Anxiety disorder, unspecified: Secondary | ICD-10-CM | POA: Diagnosis not present

## 2015-08-17 ENCOUNTER — Encounter (INDEPENDENT_AMBULATORY_CARE_PROVIDER_SITE_OTHER): Payer: Self-pay

## 2015-08-17 ENCOUNTER — Encounter: Payer: Self-pay | Admitting: Cardiovascular Disease

## 2015-08-17 ENCOUNTER — Ambulatory Visit (INDEPENDENT_AMBULATORY_CARE_PROVIDER_SITE_OTHER): Payer: Medicare PPO | Admitting: Cardiovascular Disease

## 2015-08-17 VITALS — BP 134/64 | HR 74 | Ht 65.5 in | Wt 133.2 lb

## 2015-08-17 DIAGNOSIS — I10 Essential (primary) hypertension: Secondary | ICD-10-CM | POA: Diagnosis not present

## 2015-08-17 DIAGNOSIS — I6350 Cerebral infarction due to unspecified occlusion or stenosis of unspecified cerebral artery: Secondary | ICD-10-CM | POA: Diagnosis not present

## 2015-08-17 DIAGNOSIS — I5022 Chronic systolic (congestive) heart failure: Secondary | ICD-10-CM

## 2015-08-17 DIAGNOSIS — I480 Paroxysmal atrial fibrillation: Secondary | ICD-10-CM | POA: Diagnosis not present

## 2015-08-17 DIAGNOSIS — E782 Mixed hyperlipidemia: Secondary | ICD-10-CM

## 2015-08-17 NOTE — Assessment & Plan Note (Signed)
Appears to be maintaining normal sinus rhythm, We have stressed importance of compliance with her eliquis  given recent stroke

## 2015-08-17 NOTE — Assessment & Plan Note (Signed)
Previous history of depressed ejection fraction in the setting of atrial fibrillation June 2016 Normal sinus rhythm on echocardiogram since that time Appears euvolemic on today's visit

## 2015-08-17 NOTE — Patient Instructions (Addendum)
Please continue on the lipitor one a day If you have side effects, Call the office   Please call us if you have new issues that need to be addressed before your next appt.  Your physician wants you to follow-up in: 6 months.  You will receive a reminder letter in the mail two months in advance. If you don't receive a letter, please call our office to schedule the follow-up appointment.

## 2015-08-17 NOTE — Progress Notes (Signed)
Patient ID: Karen Bowman, female    DOB: 12/09/1940, 75 y.o.   MRN: [id number]  HPI Comments: 75 year old woman with history of paroxysmal atrial fibrillation, hospitalization July 5 with discharge 10/20/2014 for atrial fibrillation, discharged on amiodarone, anticoagulation, digoxin, metoprolol.  History of hyperlipidemia, total cholesterol 280  She presents today for follow-up of her atrial fibrillation and after recent stroke March 2017  Hospital records were reviewed from March 2017, right side deficits upper extremity, lower extremity, speech deficits TPA was not given CT scan of the head and neck documenting diffuse PAD Discharged with eliquis and aspirin 81 mg daily She has completed rehabilitation, has been home for approximately one month, Reports improving deficits, able to floss her teeth, brush her hair,  graduated to a cane from a walker Denies any tachycardia or palpitations concerning for arrhythmia  Prior lab work March 2017 showing total cholesterol 270 She reports being compliant with her Lipitor for the past month  EKG on today's visit shows normal sinus rhythm with rate 74 bpm, no significant ST or T-wave changes   Other past medical history Notes indicate TEE with cardioversion 10/18/2014 which was unsuccessful        Allergies  Allergen Reactions  . Ciprofloxacin     REACTION: dizzy  . Ciprofloxacin Nausea And Vomiting  . Hydrochlorothiazide Nausea And Vomiting  . Hydrochlorothiazide W-Triamterene     REACTION: nausea  . Sulfa Antibiotics Nausea And Vomiting  . Sulfonamide Derivatives     REACTION: nausea \\T \ vomiting  . Valsartan     REACTION: angioedema - probable  . Valsartan Nausea And Vomiting    Current Outpatient Prescriptions on File Prior to Visit  Medication Sig Dispense Refill  . amiodarone (PACERONE) 200 MG tablet Take 0.5 tablets (100 mg total) by mouth daily. 90 tablet 3  . amLODipine (NORVASC) 2.5 MG tablet Take 2 tablets (5 mg  total) by mouth daily. (Patient taking differently: Take 2.5 mg by mouth daily. ) 90 tablet 3  . apixaban (ELIQUIS) 5 MG TABS tablet Take 1 tablet (5 mg total) by mouth 2 (two) times daily. 180 tablet 3  . aspirin EC 81 MG EC tablet Take 1 tablet (81 mg total) by mouth daily. 30 tablet 0  . atorvastatin (LIPITOR) 40 MG tablet Take 1 tablet (40 mg total) by mouth daily at 6 PM. 90 tablet 3  . chlorthalidone (HYGROTON) 25 MG tablet Take 0.5-1 tablets (12.5-25 mg total) by mouth daily as needed (for swelling). 30 tablet 0  . Cholecalciferol 1000 UNITS capsule Take 2,000 Units by mouth daily.     Marland Kitchen CINNAMON PO Take 2 tablets by mouth daily.     . Coenzyme Q10-Fish Oil-Vit E (CO-Q 10 OMEGA-3 FISH OIL) CAPS Take 1 capsule by mouth 2 (two) times daily.    Marland Kitchen CRANBERRY PO Take 1 tablet by mouth 2 (two) times daily.     Marland Kitchen EYEBRIGHT PO Take 1 tablet by mouth daily.    Marland Kitchen levocetirizine (XYZAL) 5 MG tablet Take 1 tablet (5 mg total) by mouth every evening. 90 tablet 1  . metoprolol tartrate (LOPRESSOR) 25 MG tablet Take 0.5 tablets (12.5 mg total) by mouth 2 (two) times daily. 90 tablet 3  . MISC NATURAL PRODUCTS PO Take 1 tablet by mouth daily. Multivitamin-Dr. Talmage Coin    . OVER THE COUNTER MEDICATION Take 2 tablets by mouth daily. Omega Q Plus w/resveratrol-Dr. Talmage Coin    . tiZANidine (ZANAFLEX) 2 MG tablet Take 1 tablet (2 mg total)  by mouth at bedtime. (Patient taking differently: Take 1 mg by mouth at bedtime. ) 90 tablet 0  . loratadine (CLARITIN) 10 MG tablet Take 1 tablet (10 mg total) by mouth daily. (Patient not taking: Reported on 08/17/2015) 90 tablet 3   No current facility-administered medications on file prior to visit.    Past Medical History  Diagnosis Date  . Hypertension   . Heart murmur   . Anxiety   . Arrhythmia   . Arthritis   . Atrial fibrillation ([place])   . Acute heart failure ([place])   . Hyperlipemia     Past Surgical History  Procedure Laterality Date  . Tonsillectomy    .  Appendectomy    . Tumor removal      right arm  . Bladder suspension    . Abdominal hysterectomy    . Tee without cardioversion N/A 10/18/2014    Procedure: TRANSESOPHAGEAL ECHOCARDIOGRAM (TEE);  Surgeon: Thayer Headings, MD;  Location: [facility] ORS;  Service: Cardiovascular;  Laterality: N/A;  . Electrophysiologic study N/A 10/18/2014    Procedure: CARDIOVERSION;  Surgeon: Thayer Headings, MD;  Location: [facility] ORS;  Service: Cardiovascular;  Laterality: N/A;    Social History  reports that she has never smoked. She has never used smokeless tobacco. She reports that she does not drink alcohol or use illicit drugs.  Family History family history includes Cancer in her father; Diabetes in her maternal aunt; Heart disease in her mother; Osteoporosis in her mother.   Review of Systems  Constitutional: Negative.   Respiratory: Negative.   Cardiovascular: Negative.   Gastrointestinal: Negative.   Musculoskeletal: Negative.   Neurological: Negative.   Hematological: Negative.   Psychiatric/Behavioral: Negative.   All other systems reviewed and are negative.   BP 134/64 mmHg  Pulse 74  Ht 5' 5.5" (1.664 m)  Wt 133 lb 4 oz (60.442 kg)  BMI 21.83 kg/m2  LMP  (LMP Unknown)   Physical Exam  Constitutional: She is oriented to person, place, and time. She appears well-developed and well-nourished.  Mild right-sided deficits, minimal speech deficits  HENT:  Head: Normocephalic.  Nose: Nose normal.  Mouth/Throat: Oropharynx is clear and moist.  Eyes: Conjunctivae are normal. Pupils are equal, round, and reactive to light.  Neck: Normal range of motion. Neck supple. No JVD present.  Cardiovascular: Regular rhythm, normal heart sounds and intact distal pulses.  Bradycardia present.  Exam reveals no gallop and no friction rub.   No murmur heard. Nonpitting edema of the lower extremities  Pulmonary/Chest: Effort normal and breath sounds normal. No respiratory distress. She has no wheezes. She  has no rales. She exhibits no tenderness.  Abdominal: Soft. Bowel sounds are normal. She exhibits no distension. There is no tenderness.  Musculoskeletal: Normal range of motion. She exhibits no edema or tenderness.  Right hand weakness,  Lymphadenopathy:    She has no cervical adenopathy.  Neurological: She is alert and oriented to person, place, and time. Coordination normal.  Skin: Skin is warm and dry. No rash noted. No erythema.  Psychiatric: She has a normal mood and affect. Her behavior is normal. Judgment and thought content normal.

## 2015-08-17 NOTE — Assessment & Plan Note (Signed)
Long discussion concerning the recent stroke We have stressed the importance of aggressive cholesterol control given her cerebral disease. Also with aortic plaque on CT

## 2015-08-17 NOTE — Assessment & Plan Note (Signed)
Blood pressure is well controlled on today's visit. No changes made to the medications. 

## 2015-08-17 NOTE — Assessment & Plan Note (Signed)
Long discussion concerning her cholesterol She is reluctant to be on a statin Goal cholesterol numbers discussed with her in detail. " it will never get there" Suggested she stay on her Lipitor 40 mg for now  May need to add zetia daily if not at goal  Reluctant to take many of her medications, wary of side effects    Total encounter time more than 25 minutes  Greater than 50% was spent in counseling and coordination of care with the patient

## 2015-08-22 DIAGNOSIS — I69318 Other symptoms and signs involving cognitive functions following cerebral infarction: Secondary | ICD-10-CM | POA: Diagnosis not present

## 2015-08-22 DIAGNOSIS — I5022 Chronic systolic (congestive) heart failure: Secondary | ICD-10-CM | POA: Diagnosis not present

## 2015-08-22 DIAGNOSIS — F419 Anxiety disorder, unspecified: Secondary | ICD-10-CM | POA: Diagnosis not present

## 2015-08-22 DIAGNOSIS — I480 Paroxysmal atrial fibrillation: Secondary | ICD-10-CM | POA: Diagnosis not present

## 2015-08-22 DIAGNOSIS — M1990 Unspecified osteoarthritis, unspecified site: Secondary | ICD-10-CM | POA: Diagnosis not present

## 2015-08-22 DIAGNOSIS — I110 Hypertensive heart disease with heart failure: Secondary | ICD-10-CM | POA: Diagnosis not present

## 2015-08-22 DIAGNOSIS — I69322 Dysarthria following cerebral infarction: Secondary | ICD-10-CM | POA: Diagnosis not present

## 2015-08-22 DIAGNOSIS — I252 Old myocardial infarction: Secondary | ICD-10-CM | POA: Diagnosis not present

## 2015-08-22 DIAGNOSIS — I69351 Hemiplegia and hemiparesis following cerebral infarction affecting right dominant side: Secondary | ICD-10-CM | POA: Diagnosis not present

## 2015-08-23 ENCOUNTER — Telehealth: Payer: Self-pay | Admitting: *Deleted

## 2015-08-23 DIAGNOSIS — I110 Hypertensive heart disease with heart failure: Secondary | ICD-10-CM | POA: Diagnosis not present

## 2015-08-23 DIAGNOSIS — I69322 Dysarthria following cerebral infarction: Secondary | ICD-10-CM | POA: Diagnosis not present

## 2015-08-23 DIAGNOSIS — F419 Anxiety disorder, unspecified: Secondary | ICD-10-CM | POA: Diagnosis not present

## 2015-08-23 DIAGNOSIS — M1990 Unspecified osteoarthritis, unspecified site: Secondary | ICD-10-CM | POA: Diagnosis not present

## 2015-08-23 DIAGNOSIS — I69318 Other symptoms and signs involving cognitive functions following cerebral infarction: Secondary | ICD-10-CM | POA: Diagnosis not present

## 2015-08-23 DIAGNOSIS — I252 Old myocardial infarction: Secondary | ICD-10-CM | POA: Diagnosis not present

## 2015-08-23 DIAGNOSIS — I69351 Hemiplegia and hemiparesis following cerebral infarction affecting right dominant side: Secondary | ICD-10-CM | POA: Diagnosis not present

## 2015-08-23 DIAGNOSIS — I5022 Chronic systolic (congestive) heart failure: Secondary | ICD-10-CM | POA: Diagnosis not present

## 2015-08-23 DIAGNOSIS — I480 Paroxysmal atrial fibrillation: Secondary | ICD-10-CM | POA: Diagnosis not present

## 2015-08-23 NOTE — Telephone Encounter (Signed)
Pamalee Leyden from [facility] called to report that patient fell at her residence yesterday. There were no injuries, he just wanted to make you aware. His number is [phone] if you need more info or have any questions.

## 2015-08-24 DIAGNOSIS — I5022 Chronic systolic (congestive) heart failure: Secondary | ICD-10-CM | POA: Diagnosis not present

## 2015-08-24 DIAGNOSIS — M1990 Unspecified osteoarthritis, unspecified site: Secondary | ICD-10-CM | POA: Diagnosis not present

## 2015-08-24 DIAGNOSIS — I69322 Dysarthria following cerebral infarction: Secondary | ICD-10-CM | POA: Diagnosis not present

## 2015-08-24 DIAGNOSIS — I110 Hypertensive heart disease with heart failure: Secondary | ICD-10-CM | POA: Diagnosis not present

## 2015-08-24 DIAGNOSIS — I69351 Hemiplegia and hemiparesis following cerebral infarction affecting right dominant side: Secondary | ICD-10-CM | POA: Diagnosis not present

## 2015-08-24 DIAGNOSIS — I480 Paroxysmal atrial fibrillation: Secondary | ICD-10-CM | POA: Diagnosis not present

## 2015-08-24 DIAGNOSIS — I69318 Other symptoms and signs involving cognitive functions following cerebral infarction: Secondary | ICD-10-CM | POA: Diagnosis not present

## 2015-08-24 DIAGNOSIS — F419 Anxiety disorder, unspecified: Secondary | ICD-10-CM | POA: Diagnosis not present

## 2015-08-24 DIAGNOSIS — I252 Old myocardial infarction: Secondary | ICD-10-CM | POA: Diagnosis not present

## 2015-08-24 NOTE — Telephone Encounter (Signed)
Noted  

## 2015-08-25 ENCOUNTER — Telehealth: Payer: Self-pay

## 2015-08-25 DIAGNOSIS — I69322 Dysarthria following cerebral infarction: Secondary | ICD-10-CM | POA: Diagnosis not present

## 2015-08-25 DIAGNOSIS — I69318 Other symptoms and signs involving cognitive functions following cerebral infarction: Secondary | ICD-10-CM | POA: Diagnosis not present

## 2015-08-25 DIAGNOSIS — I110 Hypertensive heart disease with heart failure: Secondary | ICD-10-CM | POA: Diagnosis not present

## 2015-08-25 DIAGNOSIS — F419 Anxiety disorder, unspecified: Secondary | ICD-10-CM | POA: Diagnosis not present

## 2015-08-25 DIAGNOSIS — I252 Old myocardial infarction: Secondary | ICD-10-CM | POA: Diagnosis not present

## 2015-08-25 DIAGNOSIS — I69351 Hemiplegia and hemiparesis following cerebral infarction affecting right dominant side: Secondary | ICD-10-CM | POA: Diagnosis not present

## 2015-08-25 DIAGNOSIS — I5022 Chronic systolic (congestive) heart failure: Secondary | ICD-10-CM | POA: Diagnosis not present

## 2015-08-25 DIAGNOSIS — M1990 Unspecified osteoarthritis, unspecified site: Secondary | ICD-10-CM | POA: Diagnosis not present

## 2015-08-25 DIAGNOSIS — I480 Paroxysmal atrial fibrillation: Secondary | ICD-10-CM | POA: Diagnosis not present

## 2015-08-25 NOTE — Telephone Encounter (Signed)
Karen Bowman- PT-called for verbal orders to extend PT to 2wk3. Left message to approve orders.

## 2015-08-28 DIAGNOSIS — I110 Hypertensive heart disease with heart failure: Secondary | ICD-10-CM | POA: Diagnosis not present

## 2015-08-28 DIAGNOSIS — I5022 Chronic systolic (congestive) heart failure: Secondary | ICD-10-CM | POA: Diagnosis not present

## 2015-08-28 DIAGNOSIS — I252 Old myocardial infarction: Secondary | ICD-10-CM | POA: Diagnosis not present

## 2015-08-28 DIAGNOSIS — I69318 Other symptoms and signs involving cognitive functions following cerebral infarction: Secondary | ICD-10-CM | POA: Diagnosis not present

## 2015-08-28 DIAGNOSIS — I69322 Dysarthria following cerebral infarction: Secondary | ICD-10-CM | POA: Diagnosis not present

## 2015-08-28 DIAGNOSIS — F419 Anxiety disorder, unspecified: Secondary | ICD-10-CM | POA: Diagnosis not present

## 2015-08-28 DIAGNOSIS — M1990 Unspecified osteoarthritis, unspecified site: Secondary | ICD-10-CM | POA: Diagnosis not present

## 2015-08-28 DIAGNOSIS — I69351 Hemiplegia and hemiparesis following cerebral infarction affecting right dominant side: Secondary | ICD-10-CM | POA: Diagnosis not present

## 2015-08-28 DIAGNOSIS — I480 Paroxysmal atrial fibrillation: Secondary | ICD-10-CM | POA: Diagnosis not present

## 2015-08-30 DIAGNOSIS — F419 Anxiety disorder, unspecified: Secondary | ICD-10-CM | POA: Diagnosis not present

## 2015-08-30 DIAGNOSIS — I480 Paroxysmal atrial fibrillation: Secondary | ICD-10-CM | POA: Diagnosis not present

## 2015-08-30 DIAGNOSIS — I252 Old myocardial infarction: Secondary | ICD-10-CM | POA: Diagnosis not present

## 2015-08-30 DIAGNOSIS — M1990 Unspecified osteoarthritis, unspecified site: Secondary | ICD-10-CM | POA: Diagnosis not present

## 2015-08-30 DIAGNOSIS — I5022 Chronic systolic (congestive) heart failure: Secondary | ICD-10-CM | POA: Diagnosis not present

## 2015-08-30 DIAGNOSIS — I69351 Hemiplegia and hemiparesis following cerebral infarction affecting right dominant side: Secondary | ICD-10-CM | POA: Diagnosis not present

## 2015-08-30 DIAGNOSIS — I110 Hypertensive heart disease with heart failure: Secondary | ICD-10-CM | POA: Diagnosis not present

## 2015-08-30 DIAGNOSIS — I69322 Dysarthria following cerebral infarction: Secondary | ICD-10-CM | POA: Diagnosis not present

## 2015-08-30 DIAGNOSIS — I69318 Other symptoms and signs involving cognitive functions following cerebral infarction: Secondary | ICD-10-CM | POA: Diagnosis not present

## 2015-09-01 DIAGNOSIS — I252 Old myocardial infarction: Secondary | ICD-10-CM | POA: Diagnosis not present

## 2015-09-01 DIAGNOSIS — I480 Paroxysmal atrial fibrillation: Secondary | ICD-10-CM | POA: Diagnosis not present

## 2015-09-01 DIAGNOSIS — I110 Hypertensive heart disease with heart failure: Secondary | ICD-10-CM | POA: Diagnosis not present

## 2015-09-01 DIAGNOSIS — M1990 Unspecified osteoarthritis, unspecified site: Secondary | ICD-10-CM | POA: Diagnosis not present

## 2015-09-01 DIAGNOSIS — I5022 Chronic systolic (congestive) heart failure: Secondary | ICD-10-CM | POA: Diagnosis not present

## 2015-09-01 DIAGNOSIS — I69322 Dysarthria following cerebral infarction: Secondary | ICD-10-CM | POA: Diagnosis not present

## 2015-09-01 DIAGNOSIS — F419 Anxiety disorder, unspecified: Secondary | ICD-10-CM | POA: Diagnosis not present

## 2015-09-01 DIAGNOSIS — I69318 Other symptoms and signs involving cognitive functions following cerebral infarction: Secondary | ICD-10-CM | POA: Diagnosis not present

## 2015-09-01 DIAGNOSIS — I69351 Hemiplegia and hemiparesis following cerebral infarction affecting right dominant side: Secondary | ICD-10-CM | POA: Diagnosis not present

## 2015-09-05 DIAGNOSIS — I69318 Other symptoms and signs involving cognitive functions following cerebral infarction: Secondary | ICD-10-CM | POA: Diagnosis not present

## 2015-09-05 DIAGNOSIS — I480 Paroxysmal atrial fibrillation: Secondary | ICD-10-CM | POA: Diagnosis not present

## 2015-09-05 DIAGNOSIS — I69322 Dysarthria following cerebral infarction: Secondary | ICD-10-CM | POA: Diagnosis not present

## 2015-09-05 DIAGNOSIS — M1990 Unspecified osteoarthritis, unspecified site: Secondary | ICD-10-CM | POA: Diagnosis not present

## 2015-09-05 DIAGNOSIS — I110 Hypertensive heart disease with heart failure: Secondary | ICD-10-CM | POA: Diagnosis not present

## 2015-09-05 DIAGNOSIS — F419 Anxiety disorder, unspecified: Secondary | ICD-10-CM | POA: Diagnosis not present

## 2015-09-05 DIAGNOSIS — I69351 Hemiplegia and hemiparesis following cerebral infarction affecting right dominant side: Secondary | ICD-10-CM | POA: Diagnosis not present

## 2015-09-05 DIAGNOSIS — I252 Old myocardial infarction: Secondary | ICD-10-CM | POA: Diagnosis not present

## 2015-09-05 DIAGNOSIS — I5022 Chronic systolic (congestive) heart failure: Secondary | ICD-10-CM | POA: Diagnosis not present

## 2015-09-06 DIAGNOSIS — I480 Paroxysmal atrial fibrillation: Secondary | ICD-10-CM | POA: Diagnosis not present

## 2015-09-06 DIAGNOSIS — F419 Anxiety disorder, unspecified: Secondary | ICD-10-CM | POA: Diagnosis not present

## 2015-09-06 DIAGNOSIS — I252 Old myocardial infarction: Secondary | ICD-10-CM | POA: Diagnosis not present

## 2015-09-06 DIAGNOSIS — I69351 Hemiplegia and hemiparesis following cerebral infarction affecting right dominant side: Secondary | ICD-10-CM | POA: Diagnosis not present

## 2015-09-06 DIAGNOSIS — I110 Hypertensive heart disease with heart failure: Secondary | ICD-10-CM | POA: Diagnosis not present

## 2015-09-06 DIAGNOSIS — I69322 Dysarthria following cerebral infarction: Secondary | ICD-10-CM | POA: Diagnosis not present

## 2015-09-06 DIAGNOSIS — I69318 Other symptoms and signs involving cognitive functions following cerebral infarction: Secondary | ICD-10-CM | POA: Diagnosis not present

## 2015-09-06 DIAGNOSIS — I5022 Chronic systolic (congestive) heart failure: Secondary | ICD-10-CM | POA: Diagnosis not present

## 2015-09-06 DIAGNOSIS — M1990 Unspecified osteoarthritis, unspecified site: Secondary | ICD-10-CM | POA: Diagnosis not present

## 2015-09-07 DIAGNOSIS — I252 Old myocardial infarction: Secondary | ICD-10-CM | POA: Diagnosis not present

## 2015-09-07 DIAGNOSIS — I5022 Chronic systolic (congestive) heart failure: Secondary | ICD-10-CM | POA: Diagnosis not present

## 2015-09-07 DIAGNOSIS — I110 Hypertensive heart disease with heart failure: Secondary | ICD-10-CM | POA: Diagnosis not present

## 2015-09-07 DIAGNOSIS — I69322 Dysarthria following cerebral infarction: Secondary | ICD-10-CM | POA: Diagnosis not present

## 2015-09-07 DIAGNOSIS — I69351 Hemiplegia and hemiparesis following cerebral infarction affecting right dominant side: Secondary | ICD-10-CM | POA: Diagnosis not present

## 2015-09-07 DIAGNOSIS — I69318 Other symptoms and signs involving cognitive functions following cerebral infarction: Secondary | ICD-10-CM | POA: Diagnosis not present

## 2015-09-07 DIAGNOSIS — F419 Anxiety disorder, unspecified: Secondary | ICD-10-CM | POA: Diagnosis not present

## 2015-09-07 DIAGNOSIS — I480 Paroxysmal atrial fibrillation: Secondary | ICD-10-CM | POA: Diagnosis not present

## 2015-09-07 DIAGNOSIS — M1990 Unspecified osteoarthritis, unspecified site: Secondary | ICD-10-CM | POA: Diagnosis not present

## 2015-09-11 ENCOUNTER — Encounter: Payer: Self-pay | Admitting: Neurology

## 2015-09-11 ENCOUNTER — Ambulatory Visit (INDEPENDENT_AMBULATORY_CARE_PROVIDER_SITE_OTHER): Payer: Medicare PPO | Admitting: Neurology

## 2015-09-11 ENCOUNTER — Ambulatory Visit: Payer: Self-pay | Admitting: Family

## 2015-09-11 VITALS — BP 141/74 | HR 60 | Ht 65.0 in | Wt 131.4 lb

## 2015-09-11 DIAGNOSIS — I63312 Cerebral infarction due to thrombosis of left middle cerebral artery: Secondary | ICD-10-CM | POA: Diagnosis not present

## 2015-09-11 DIAGNOSIS — I480 Paroxysmal atrial fibrillation: Secondary | ICD-10-CM

## 2015-09-11 DIAGNOSIS — E785 Hyperlipidemia, unspecified: Secondary | ICD-10-CM | POA: Diagnosis not present

## 2015-09-11 DIAGNOSIS — I10 Essential (primary) hypertension: Secondary | ICD-10-CM | POA: Diagnosis not present

## 2015-09-11 DIAGNOSIS — Z7901 Long term (current) use of anticoagulants: Secondary | ICD-10-CM | POA: Diagnosis not present

## 2015-09-11 NOTE — Progress Notes (Signed)
STROKE NEUROLOGY FOLLOW UP NOTE  NAME: Karen Bowman DOB: Jan 15, 1941  REASON FOR VISIT: stroke follow up HISTORY FROM: pt and chart  Today we had the pleasure of seeing Karen Bowman in follow-up at our Neurology Clinic. Pt was accompanied by no one.   History Summary Karen Bowman is a 75 y.o. female with history of atrial fibrillation on eliquis, hypertension and systolic heart failure was admitted on 06/27/15 for right facial droop and right-sided weakness. She did not receive IV t-PA due to being on Eliquis. MRI showed left BG infarcts, more consistent with small vessel disease. CTA head and neck moderate to severe b/l proximal PCA stenosis. EF 55-60% and LDL 195 and A1C 5.7. Her eliquis was restarted and also ASA 81 was added as well as lipitor 80. She was discharged in good condition.  Interval History During the interval time, the patient has been doing well. On eliquis and ASA without bleeding issues. However, her lipitor was decreased to 40mg  with unclear reason. According to her med list, she is also on 100mg  daily amiodarone. I called her pharmacy and discussed with her pharmacist about the interaction between eliquis and amiodarone but no definite answer was given. Her BP today 141/74 today.    REVIEW OF SYSTEMS: Full 14 system review of systems performed and notable only for those listed below and in HPI above, all others are negative:  Constitutional:   Cardiovascular: leg swelling Ear/Nose/Throat:  Ringing in ears Skin:  Eyes:   Respiratory:   Gastroitestinal:   Genitourinary: incontinence of bladder, frequency of urination Hematology/Lymphatic:  Bruise/bleeding easily Endocrine:  Musculoskeletal:   Allergy/Immunology:   Neurological:   Psychiatric:  Sleep: restless leg  The following represents the patient's updated allergies and side effects list: Allergies  Allergen Reactions  . Ciprofloxacin     REACTION: dizzy  . Ciprofloxacin Nausea And  Vomiting  . Hydrochlorothiazide Nausea And Vomiting  . Hydrochlorothiazide W-Triamterene     REACTION: nausea  . Sulfa Antibiotics Nausea And Vomiting  . Sulfonamide Derivatives     REACTION: nausea \\T \ vomiting  . Valsartan     REACTION: angioedema - probable  . Valsartan Nausea And Vomiting    The neurologically relevant items on the patient's problem list were reviewed on today's visit.  Neurologic Examination  A problem focused neurological exam (12 or more points of the single system neurologic examination, vital signs counts as 1 point, cranial nerves count for 8 points) was performed.  Blood pressure 141/74, pulse 60, height 5\' 5"  (1.651 m), weight 131 lb 6.4 oz (59.603 kg).  General - Well nourished, well developed, in no apparent distress.  Ophthalmologic - Sharp disc margins OU.  Cardiovascular - Regular rate and rhythm.  Mental Status -  Level of arousal and orientation to time, place, and person were intact. Language including expression, naming, repetition, comprehension was assessed and found intact. Fund of Knowledge was assessed and was intact.  Cranial Nerves II - XII - II - Visual field intact OU. III, IV, VI - Extraocular movements intact. V - Facial sensation intact bilaterally. VII - right nasolabial fold flattening. VIII - Hearing & vestibular intact bilaterally. X - Palate elevates symmetrically, mild dysarthria. XI - Chin turning & shoulder shrug intact bilaterally. XII - Tongue protrusion intact.  Motor Strength - The patient's strength was normal in all extremities and pronator drift was absent.  Bulk was normal and fasciculations were absent.   Motor Tone - Muscle tone was assessed  at the neck and appendages and was normal.  Reflexes - The patient's reflexes were 1+ in all extremities and she had no pathological reflexes.  Sensory - Light touch, temperature/pinprick, vibration and proprioception, and Romberg testing were assessed and were  normal.    Coordination - The patient had normal movements in the hands and feet with no ataxia or dysmetria.  Tremor was absent.  Gait and Station - The patient's transfers, posture, gait, station, and turns were observed as normal.   Functional score  mRS = 1   0 - No symptoms.   1 - No significant disability. Able to carry out all usual activities, despite some symptoms.   2 - Slight disability. Able to look after own affairs without assistance, but unable to carry out all previous activities.   3 - Moderate disability. Requires some help, but able to walk unassisted.   4 - Moderately severe disability. Unable to attend to own bodily needs without assistance, and unable to walk unassisted.   5 - Severe disability. Requires constant nursing care and attention, bedridden, incontinent.   6 - Dead.   NIH Stroke Scale   Level Of Consciousness 0=Alert; keenly responsive 1=Not alert, but arousable by minor stimulation 2=Not alert, requires repeated stimulation 3=Responds only with reflex movements 0  LOC Questions to Month and Age 78=Answers both questions correctly 1=Answers one question correctly 2=Answers neither question correctly 0  LOC Commands      -Open/Close eyes     -Open/close grip 0=Performs both tasks correctly 1=Performs one task correctly 2=Performs neighter task correctly 0  Best Gaze 0=Normal 1=Partial gaze palsy 2=Forced deviation, or total gaze paresis 0  Visual 0=No visual loss 1=Partial hemianopia 2=Complete hemianopia 3=Bilateral hemianopia (blind including cortical blindness) 0  Facial Palsy 0=Normal symmetrical movement 1=Minor paralysis (asymmetry) 2=Partial paralysis (lower face) 3=Complete paralysis (upper and lower face) 1  Motor  0=No drift, limb holds posture for full 10 seconds 1=Drift, limb holds posture, no drift to bed 2=Some antigravity effort, cannot maintain posture, drifts to bed 3=No effort against gravity, limb falls 4=No  movement Right Arm 0     Leg 0    Left Arm 0     Leg 0  Limb Ataxia 0=Absent 1=Present in one limb 2=Present in two limbs 0  Sensory 0=Normal 1=Mild to moderate sensory loss 2=Severe to total sensory loss 0  Best Language 0=No aphasia, normal 1=Mild to moderate aphasia 2=Mute, global aphasia 3=Mute, global aphasia 0  Dysarthria 0=Normal 1=Mild to moderate 2=Severe, unintelligible or mute/anarthric 1  Extinction/Neglect 0=No abnormality 1=Extinction to bilateral simultaneous stimulation 2=Profound neglect 0  Total   2     Data reviewed: I personally reviewed the images and agree with the radiology interpretations.  Ct Angio Head and neck W/cm &/or Wo Cm  06/28/2015 IMPRESSION: 1. Moderate to severe bilateral proximal PCA stenoses. 2. Mild right cavernous and supraclinoid ICA stenosis. 3. No cervical carotid or vertebral artery stenosis. Electronically Signed By: Logan Bores M.D. On: 06/28/2015 12:03   Ct Head Wo Contrast  06/27/2015 IMPRESSION: No acute intracranial hemorrhage. Artifact versus less likely a small focal left MCA thrombus. Clinical correlation recommended. CT angiography may provide better evaluation. Electronically Signed: By: Anner Crete M.D. On: 06/27/2015 22:23   Mri and Mra Brain Wo Contrast  06/28/2015 IMPRESSION: 1. Acute left basal ganglia infarct. 2. Mild chronic small vessel ischemic disease and cerebral atrophy. 3. No large vessel occlusion or significant proximal anterior circulation stenosis. 4. Severe right P2 PCA stenosis.  Electronically Signed By: Logan Bores M.D. On: 06/28/2015 11:15   2D echo - - Left ventricle: The cavity size was normal. Wall thickness was  increased in a pattern of mild LVH. Systolic function was normal.  The estimated ejection fraction was in the range of 55% to 60%.  Wall motion was normal; there were no regional wall motion  abnormalities. - Mitral valve: Moderately calcified annulus. Moderately  thickened,  moderately calcified leaflets . - Atrial septum: No defect or patent foramen ovale was identified.   Component     Latest Ref Rng 06/28/2015  Cholesterol     0 - 200 mg/dL 276 (H)  Triglycerides     <150 mg/dL 57  HDL Cholesterol     >40 mg/dL 70  Total CHOL/HDL Ratio      3.9  VLDL     0 - 40 mg/dL 11  LDL (calc)     0 - 99 mg/dL 195 (H)  Hemoglobin A1C     4.8 - 5.6 % 5.7 (H)  Mean Plasma Glucose      117     Assessment: As you may recall, she is a 75 y.o. Caucasian female with PMH of atrial fibrillation on eliquis, hypertension and systolic heart failure was admitted on 06/27/15 for left BG infarcts, more consistent with small vessel disease. CTA head and neck moderate to severe b/l proximal PCA stenosis. EF 55-60% and LDL 195 and A1C 5.7. Her eliquis was continued and also ASA 81 was added as well as lipitor. According to her med list, she is also on 100mg  daily amiodarone. Although we aware of the interaction, she has been on amiodarone and eliquis for a year without bleeding issue, we will continue current dose. I discussed with [facility] but no definite answer was given.   Plan:  - continue eliquis, ASA and lipitor as current dose for stroke prevention - follow up with cardiology for afib management  - Follow up with your primary care physician for stroke risk factor modification. Recommend maintain blood pressure goal <130/80, diabetes with hemoglobin A1c goal below 6.5% and lipids with LDL cholesterol goal below 70 mg/dL.  - check BP at home and record - bleeding precautions  - follow up in 3 months  I spent more than 25 minutes of face to face time with the patient. Greater than 50% of time was spent in counseling and coordination of care. We discussed about bleeding precautions, as well as drug drug interaction between eliquis and amiodarone.    No orders of the defined types were placed in this encounter.    Meds ordered this encounter    Medications  . UNABLE TO FIND    Sig: Med Name/cidar vinegar otc  . UNABLE TO FIND    Sig: Med Name: slipppry elm  . UNABLE TO FIND    Sig: Med Name: jarrow bone  . Glucosamine-Chondroitin (OSTEO BI-FLEX REGULAR STRENGTH PO)    Sig: Take by mouth.  Marland Kitchen UNABLE TO FIND    Sig: Med Name: Mk-7 artery circulation    Patient Instructions  - continue eliquis, ASA and lipitor for stroke prevention - follow up with cardiology for afib management  - Follow up with your primary care physician for stroke risk factor modification. Recommend maintain blood pressure goal <130/80, diabetes with hemoglobin A1c goal below 6.5% and lipids with LDL cholesterol goal below 70 mg/dL.  - check BP at home and record - bleeding precautions, but will let you know  what pharmacy say when she calls back.  - follow up in 3 months    Rosalin Hawking, MD PhD [facility] [address] [place], [place] [address] [phone]

## 2015-09-11 NOTE — Patient Instructions (Addendum)
-   continue eliquis, ASA and lipitor for stroke prevention - follow up with cardiology for afib management  - Follow up with your primary care physician for stroke risk factor modification. Recommend maintain blood pressure goal <130/80, diabetes with hemoglobin A1c goal below 6.5% and lipids with LDL cholesterol goal below 70 mg/dL.  - check BP at home and record - bleeding precautions, but will let you know what pharmacy say when she calls back.  - follow up in 3 months

## 2015-09-12 DIAGNOSIS — I69351 Hemiplegia and hemiparesis following cerebral infarction affecting right dominant side: Secondary | ICD-10-CM | POA: Diagnosis not present

## 2015-09-12 DIAGNOSIS — M1990 Unspecified osteoarthritis, unspecified site: Secondary | ICD-10-CM | POA: Diagnosis not present

## 2015-09-12 DIAGNOSIS — I480 Paroxysmal atrial fibrillation: Secondary | ICD-10-CM | POA: Diagnosis not present

## 2015-09-12 DIAGNOSIS — I5022 Chronic systolic (congestive) heart failure: Secondary | ICD-10-CM | POA: Diagnosis not present

## 2015-09-12 DIAGNOSIS — I69318 Other symptoms and signs involving cognitive functions following cerebral infarction: Secondary | ICD-10-CM | POA: Diagnosis not present

## 2015-09-12 DIAGNOSIS — I252 Old myocardial infarction: Secondary | ICD-10-CM | POA: Diagnosis not present

## 2015-09-12 DIAGNOSIS — I110 Hypertensive heart disease with heart failure: Secondary | ICD-10-CM | POA: Diagnosis not present

## 2015-09-12 DIAGNOSIS — F419 Anxiety disorder, unspecified: Secondary | ICD-10-CM | POA: Diagnosis not present

## 2015-09-12 DIAGNOSIS — I69322 Dysarthria following cerebral infarction: Secondary | ICD-10-CM | POA: Diagnosis not present

## 2015-09-13 ENCOUNTER — Telehealth: Payer: Self-pay | Admitting: Physical Medicine & Rehabilitation

## 2015-09-13 DIAGNOSIS — I69359 Hemiplegia and hemiparesis following cerebral infarction affecting unspecified side: Secondary | ICD-10-CM

## 2015-09-13 NOTE — Telephone Encounter (Signed)
Please look into this for me. Please and thank you

## 2015-09-13 NOTE — Telephone Encounter (Signed)
Esther OT with [facility] would like to know if the paperwork to start outpatient therapy at [facility] has been sent over to start on June 22 or June 23.  Please call her at [phone].

## 2015-09-14 DIAGNOSIS — I480 Paroxysmal atrial fibrillation: Secondary | ICD-10-CM | POA: Diagnosis not present

## 2015-09-14 DIAGNOSIS — I69318 Other symptoms and signs involving cognitive functions following cerebral infarction: Secondary | ICD-10-CM | POA: Diagnosis not present

## 2015-09-14 DIAGNOSIS — I69322 Dysarthria following cerebral infarction: Secondary | ICD-10-CM | POA: Diagnosis not present

## 2015-09-14 DIAGNOSIS — I110 Hypertensive heart disease with heart failure: Secondary | ICD-10-CM | POA: Diagnosis not present

## 2015-09-14 DIAGNOSIS — I69351 Hemiplegia and hemiparesis following cerebral infarction affecting right dominant side: Secondary | ICD-10-CM | POA: Diagnosis not present

## 2015-09-14 DIAGNOSIS — I5022 Chronic systolic (congestive) heart failure: Secondary | ICD-10-CM | POA: Diagnosis not present

## 2015-09-14 DIAGNOSIS — F419 Anxiety disorder, unspecified: Secondary | ICD-10-CM | POA: Diagnosis not present

## 2015-09-14 DIAGNOSIS — I252 Old myocardial infarction: Secondary | ICD-10-CM | POA: Diagnosis not present

## 2015-09-14 DIAGNOSIS — M1990 Unspecified osteoarthritis, unspecified site: Secondary | ICD-10-CM | POA: Diagnosis not present

## 2015-09-21 NOTE — Addendum Note (Signed)
Addended by: Geryl Rankins D on: 09/21/2015 08:13 AM   Modules accepted: Orders

## 2015-09-21 NOTE — Telephone Encounter (Signed)
Order placed for OT and PT, sent to [facility] for processing

## 2015-09-27 ENCOUNTER — Ambulatory Visit: Payer: Medicare PPO | Attending: Physical Medicine & Rehabilitation | Admitting: Occupational Therapy

## 2015-09-27 DIAGNOSIS — R278 Other lack of coordination: Secondary | ICD-10-CM

## 2015-09-27 DIAGNOSIS — M6281 Muscle weakness (generalized): Secondary | ICD-10-CM | POA: Diagnosis not present

## 2015-09-27 NOTE — Therapy (Signed)
[place] MAIN [facility] SERVICES [address] [place], [place], [address] Phone: [phone]   Fax:  [phone]  Occupational Therapy Treatment  Patient Details  Name: Karen Bowman MRN: [id number] Date of Birth: Feb 07, 1941 Referring Provider: Naaman Plummer  Encounter Date: 09/27/2015      OT End of Session - 09/27/15 1948    Visit Number 1   Number of Visits 24   Date for OT Re-Evaluation 12/20/15   Authorization Type 1 Medicare G code of 10   OT Start Time [id number]   OT Stop Time 1615   OT Time Calculation (min) 57 min   Activity Tolerance Patient tolerated treatment well   Behavior During Therapy [facility] for tasks assessed/performed      Past Medical History  Diagnosis Date  . Hypertension   . Heart murmur   . Anxiety   . Arrhythmia   . Arthritis   . Atrial fibrillation ([place])   . Acute heart failure ([place])   . Hyperlipemia   . Stroke [facility])     Past Surgical History  Procedure Laterality Date  . Tonsillectomy    . Appendectomy    . Tumor removal      right arm  . Bladder suspension    . Abdominal hysterectomy    . Tee without cardioversion N/A 10/18/2014    Procedure: TRANSESOPHAGEAL ECHOCARDIOGRAM (TEE);  Surgeon: Thayer Headings, MD;  Location: [facility] ORS;  Service: Cardiovascular;  Laterality: N/A;  . Electrophysiologic study N/A 10/18/2014    Procedure: CARDIOVERSION;  Surgeon: Thayer Headings, MD;  Location: [facility] ORS;  Service: Cardiovascular;  Laterality: N/A;    There were no vitals filed for this visit.      Subjective Assessment - 09/27/15 1744    Subjective  Pt. presents to the clinic with her husband whom pt. reports is hard of hearing.   Patient is accompained by: Family member   Pertinent History Pt. is a 75 y.o. female who was admitted to [facility] on 06/28/2015 with a CVA and right sided hemiplegia. Pt. has a history of HTN, chronic systolic congestive heart failure, and A fib. Pt. had inpatient rehab services, as well  as [place] services. Pt. now presents for outpatient rehab services.   Limitations Dominant RUE strength and coordination.   Currently in Pain? No/denies            [facility] OT Assessment - 09/27/15 1530    Assessment   Diagnosis CVA   Referring Provider Naaman Plummer   Onset Date 06/27/15   Prior Therapy Rehab services at [facility], [facility]   Precautions   Precautions None   Balance Screen   Has the patient fallen in the past 6 months Yes   How many times? 2   Has the patient had a decrease in activity level because of a fear of falling?  No   Is the patient reluctant to leave their home because of a fear of falling?  No   Home  Environment   Family/patient expects to be discharged to: Private residence   Living Arrangements Spouse/significant other   Available Help at Discharge Family   Type of [place] One level   Bathroom Building control surveyor;Door   [place] bars - tub/shower;Shower seat;Grab bars - toilet;Cane - single point;Walker - 2 wheels   Lives With Spouse   Prior Function   Level of [place] Independent  Vocation Retired   Leisure Signs petitions through [employer].   ADL   Eating/Feeding Independent   Grooming Independent   Upper Body Bathing Independent   Lower Body Bathing Independent   Upper Body Dressing Increased time, difficulty fastening a bra   Lower Body Dressing Independent;Increased time   Toilet [place] Independent   ADL comments Pt. scored a 65.3 on the MAM-20. Pt. identified diffiiculty with cutting meat, writing 3-4 lines legibly, opening jars, picking up a 1/2 full pitcher, and turning a key. pt. has difficulty writing.   IADL   Shopping Needs to be accompanied on any shopping trip   Light Housekeeping Performs light daily tasks such as dishwashing, bed making   Meal Prep Prepares adequate meal if supplied with ingredients   Medication  Management Is responsible for taking medication in correct dosages at correct time   Financial Management Manages financial matters independently (budgets, writes checks, pays rent, bills goes to bank), collects and keeps track of income   Written Expression   Dominant Hand Right   Handwriting 50% legible   Vision - History   Baseline Vision Wears glasses only for reading   Activity Tolerance   Activity Tolerance Tolerates 10-20 min activity with muiltiple rests   Cognition   Overall Cognitive Status Within Functional Limits for tasks assessed   Sensation   Light Touch Appears Intact   Stereognosis Appears Intact   Proprioception Appears Intact   Coordination   Right 9 Hole Peg Test 38   Left 9 Hole Peg Test 26   AROM   Overall AROM Comments Right shoulder  flexion,106(128), Abduction AROM: 78, all other jRUE joint ranges WFL. LUE AROM: WFL.   Strength   Overall Strength Comments Right soulder flexion, extension, abduction 3-/5, elbow flexion/extension, forearm supination, pronation, wrist flexion, extension 4/5   Hand Function   Right Hand Grip (lbs) 13#   Right Hand Lateral Pinch 9 lbs   Right Hand 3 Point Pinch 5 lbs   Left Hand Grip (lbs) 40#   Left Hand Lateral Pinch 11 lbs   Left 3 point pinch 13 lbs                          OT Education - 09/27/15 1944    Education provided Yes   Education Details OT services, POC, goals, and pt. current functional level.   Person(s) Educated Patient;Spouse   Methods Explanation   Comprehension Verbalized understanding             OT Long Term Goals - 09/27/15 2003    OT LONG TERM GOAL #1   Title Pt. will increase AROM in shoulder flexion by 10 degrees to be able to reach into kitchen cabinetry.   Baseline Shoulder flexion: [id number])   Time 12   Period Weeks   Status New   OT LONG TERM GOAL #2   Title Pt. will increase right grip strength by 10# to be able to open jars.   Baseline 13#, Pt. has difficulty  oopening jars.   Time 12   Period Weeks   Status New   OT LONG TERM GOAL #3   Title Pt. will improve right lateral pinch strength by 3# to be able to cut meat and veggies efficiently in preparation for a meal.   Baseline 9#, Pt. requires increased time to complete with difficulty.   Time 12   Period Weeks  Status New   OT LONG TERM GOAL #4   Title Pt. improve right [place] by 5 sec. of speed on the 9-hole peg test to be able to fasten a bra.   Baseline 38 sec. Pt. has difficulty fastening a bra.   Time 12   Period Weeks   Status New   OT LONG TERM GOAL #5   Title Pt. will be able to write 3 sentences efficiently with 100% legibility.   Baseline 50% legibilty   Time 12   Period Weeks   Status New               Plan - 09/27/15 1950    Clinical Impression Statement Pt. is a 75 y.o. who suffered from a CVA with dominant right sided hemiplegia. Pt. presents with RUE proximal shoulder weakness, and limited AROM in shoulder flexion, and  abduction. Pt. Presents with weak grip strength, pinch strength, and impaired [facility] skills. Pt. has difficulty opening jars, fastening a bra, writing, and cutting meat and vegetables during meal preparation, and reaching into upper cabinetry. Pt. requires skilled OT services to work on improving RUE ROM, strength, and coordination skills in order to be able to use her hand to complete ADL and IADL tasks successfully, and efficiently.    Rehab Potential Good   Clinical Impairments Affecting Rehab Potential Positive Indicators: age, family support, and motivation. Negative Indicators: multiple comorbidities.   OT Frequency 2x / week   OT Duration 12 weeks   OT Treatment/Interventions Self-care/ADL training;Therapeutic exercise;Therapeutic exercises;Patient/family education;Therapeutic activities;DME and/or AE instruction;Passive range of motion;Functional Mobility Training;Moist Heat;Ultrasound;Manual Therapy;Electrical Stimulation   Plan Plan to work on  improving her RUE strength, and coordination.   Recommended Other Services PT services.      Patient will benefit from skilled therapeutic intervention in order to improve the following deficits and impairments:  Pain, Decreased activity tolerance, Decreased balance, Impaired UE functional use, Impaired tone, Decreased coordination, Decreased strength, Decreased range of motion, Decreased knowledge of use of DME  Visit Diagnosis: Muscle weakness (generalized) - Plan: Ot plan of care cert/re-cert  Other lack of coordination - Plan: Ot plan of care cert/re-cert      G-Codes - [id number] 07/08/2016    Functional Assessment Tool Used Clinical judgement based on pt. current functional status, 9 hole peg test, MMT, ROM, dynamometer, pinch gauge, MAM-20, Stereognosis, sensory/proprioceptive testing.   Functional Limitation Self care   Self Care Current Status [phone]) At least 20 percent but less than 40 percent impaired, limited or restricted   Self Care Goal Status [id number]) At least 1 percent but less than 20 percent impaired, limited or restricted      Problem List Patient Active Problem List   Diagnosis Date Noted  . Cerebrovascular accident (CVA) due to thrombosis of left middle cerebral artery ([place]) 09/11/2015  . Chronic anticoagulation 09/11/2015  . Essential hypertension 09/11/2015  . HLD (hyperlipidemia) 09/11/2015  . Muscle spasticity   . CVA (cerebral infarction) 07/03/2015  . Hemiparesis affecting dominant side as late effect of stroke ([place])   . Gait disturbance, post-stroke   . Paroxysmal atrial fibrillation ([facility])   . Dyslipidemia   . Benign essential HTN   . Chronic systolic congestive heart failure ([place])   . IBS (irritable bowel syndrome)   . Cerebrovascular accident (CVA) due to occlusion of cerebral artery ([place])   . Counseling regarding end of life decision making 02/02/2015  . Atrophic vaginitis 12/23/2014  . Pelvic pressure in female 12/23/2014  . Urethral caruncle  12/23/2014  . Cystocele, midline 12/23/2014  . Bradycardia 12/12/2014  . Junctional rhythm 12/09/2014  . Chronic systolic heart failure ([place]) 10/31/2014  . A-fib ([place]) 10/14/2014  . Routine general medical examination at a health care facility 02/01/2014  . Irritable bowel syndrome (IBS) 04/09/2011  . POSTMENOPAUSAL STATUS 02/02/2009  . HYPERLIPIDEMIA 12/26/2006  . HYPERTENSION, BENIGN ESSENTIAL 12/26/2006  . ARTHRITIS, RHEUMATOID 12/26/2006   Harrel Carina, MS, OTR/L  Harrel Carina 09/27/2015, 8:32 PM  [place] MAIN [facility] SERVICES [address] [place], [place], [address] Phone: [phone]   Fax:  [phone]  Name: RYLEN CHOUNG MRN: [id number] Date of Birth: May 23, 1940

## 2015-09-28 ENCOUNTER — Ambulatory Visit: Payer: Medicare PPO | Admitting: Physical Therapy

## 2015-10-02 ENCOUNTER — Encounter: Payer: Medicare PPO | Attending: Physical Medicine & Rehabilitation | Admitting: Physical Medicine & Rehabilitation

## 2015-10-02 ENCOUNTER — Encounter: Payer: Medicare PPO | Admitting: Occupational Therapy

## 2015-10-02 ENCOUNTER — Encounter: Payer: Self-pay | Admitting: Physical Medicine & Rehabilitation

## 2015-10-02 ENCOUNTER — Ambulatory Visit: Payer: Medicare PPO | Admitting: Physical Therapy

## 2015-10-02 VITALS — BP 168/98 | HR 86 | Resp 16

## 2015-10-02 DIAGNOSIS — G8191 Hemiplegia, unspecified affecting right dominant side: Secondary | ICD-10-CM | POA: Diagnosis not present

## 2015-10-02 DIAGNOSIS — I4891 Unspecified atrial fibrillation: Secondary | ICD-10-CM | POA: Diagnosis not present

## 2015-10-02 DIAGNOSIS — E785 Hyperlipidemia, unspecified: Secondary | ICD-10-CM | POA: Insufficient documentation

## 2015-10-02 DIAGNOSIS — I69359 Hemiplegia and hemiparesis following cerebral infarction affecting unspecified side: Secondary | ICD-10-CM | POA: Diagnosis not present

## 2015-10-02 DIAGNOSIS — I639 Cerebral infarction, unspecified: Secondary | ICD-10-CM | POA: Diagnosis not present

## 2015-10-02 DIAGNOSIS — I5022 Chronic systolic (congestive) heart failure: Secondary | ICD-10-CM | POA: Diagnosis not present

## 2015-10-02 DIAGNOSIS — R011 Cardiac murmur, unspecified: Secondary | ICD-10-CM | POA: Insufficient documentation

## 2015-10-02 DIAGNOSIS — G8929 Other chronic pain: Secondary | ICD-10-CM | POA: Diagnosis not present

## 2015-10-02 DIAGNOSIS — M25561 Pain in right knee: Secondary | ICD-10-CM

## 2015-10-02 DIAGNOSIS — I10 Essential (primary) hypertension: Secondary | ICD-10-CM

## 2015-10-02 DIAGNOSIS — I499 Cardiac arrhythmia, unspecified: Secondary | ICD-10-CM | POA: Diagnosis not present

## 2015-10-02 DIAGNOSIS — R262 Difficulty in walking, not elsewhere classified: Secondary | ICD-10-CM | POA: Diagnosis not present

## 2015-10-02 DIAGNOSIS — Z90710 Acquired absence of both cervix and uterus: Secondary | ICD-10-CM | POA: Diagnosis not present

## 2015-10-02 DIAGNOSIS — M1990 Unspecified osteoarthritis, unspecified site: Secondary | ICD-10-CM | POA: Insufficient documentation

## 2015-10-02 DIAGNOSIS — I110 Hypertensive heart disease with heart failure: Secondary | ICD-10-CM | POA: Diagnosis not present

## 2015-10-02 DIAGNOSIS — R410 Disorientation, unspecified: Secondary | ICD-10-CM | POA: Diagnosis not present

## 2015-10-02 DIAGNOSIS — R202 Paresthesia of skin: Secondary | ICD-10-CM | POA: Diagnosis not present

## 2015-10-02 DIAGNOSIS — R29810 Facial weakness: Secondary | ICD-10-CM | POA: Diagnosis not present

## 2015-10-02 DIAGNOSIS — R350 Frequency of micturition: Secondary | ICD-10-CM | POA: Diagnosis not present

## 2015-10-02 DIAGNOSIS — K589 Irritable bowel syndrome without diarrhea: Secondary | ICD-10-CM | POA: Insufficient documentation

## 2015-10-02 DIAGNOSIS — M25511 Pain in right shoulder: Secondary | ICD-10-CM | POA: Insufficient documentation

## 2015-10-02 DIAGNOSIS — R531 Weakness: Secondary | ICD-10-CM | POA: Diagnosis not present

## 2015-10-02 DIAGNOSIS — R200 Anesthesia of skin: Secondary | ICD-10-CM | POA: Diagnosis not present

## 2015-10-02 DIAGNOSIS — M549 Dorsalgia, unspecified: Secondary | ICD-10-CM | POA: Diagnosis not present

## 2015-10-02 MED ORDER — DICLOFENAC SODIUM 1 % TD GEL: 1.0000 "application " | Freq: Three times a day (TID) | TRANSDERMAL | Status: DC

## 2015-10-02 NOTE — Patient Instructions (Signed)
CONTINUE TO WORK ON RIGHT KNEE RANGE OF MOTION AND STRENGTH. DISCUSS YOUR RIGHT KNEE WITH PT AT YOUR NEXT VISIT   PLEASE CALL ME WITH ANY PROBLEMS OR QUESTIONS [phone])

## 2015-10-02 NOTE — Progress Notes (Signed)
Subjective:    Patient ID: [name], female    DOB: 02-May-1940, 75 y.o.   MRN: [id number]  HPI   Mrs. Sinnett is here in follow up of hre left BG infarct. She just started outpt PT. She has been doing well at home. She hasn't fallen for a month. She has been more aware of safety and trying to be more cautious. She is using a cane for gait.   Her right knee has been bothering her. The knee is tight and painful posteriorly. Her low back is also a chronic issue.    Pain Inventory Average Pain 6 Pain Right Now 6 My pain is burning and stabbing  In the last 24 hours, has pain interfered with the following? General activity 5 Relation with others 0 Enjoyment of life 0 What TIME of day is your pain at its worst? all Sleep (in general) NA  Pain is worse with: walking, bending and some activites Pain improves with: rest, heat/ice, therapy/exercise and pacing activities Relief from Meds: 4  Mobility walk without assistance use a cane how many minutes can you walk? 20 do you drive?  no  Function not employed: date last employed 1999 I need assistance with the following:  meal prep and household duties Do you have any goals in this area?  yes  Neuro/Psych bladder control problems numbness spasms  Prior Studies Any changes since last visit?  yes CT/MRI  Physicians involved in your care Primary care [place]   Family History  Problem Relation Age of Onset  . Osteoporosis Mother   . Heart disease Mother   . Stroke Mother   . Cancer Father   . Diabetes Maternal Aunt    Social History   Social History  . Marital Status: Married    Spouse Name: N/A  . Number of Children: 2  . Years of Education: N/A   Occupational History  . retired Network engineer    Social History Main Topics  . Smoking status: Never Smoker   . Smokeless tobacco: Never Used  . Alcohol Use: No  . Drug Use: No  . Sexual Activity: Not Asked   Other Topics Concern  . None   Social History  Narrative   ** Merged History Encounter **       Exercise:walking 2-3 times a week   End of life issues: Has living will.. Husband is HCPOA. Full code (reviewed 2014)   Past Surgical History  Procedure Laterality Date  . Tonsillectomy    . Appendectomy    . Tumor removal      right arm  . Bladder suspension    . Abdominal hysterectomy    . Tee without cardioversion N/A 10/18/2014    Procedure: TRANSESOPHAGEAL ECHOCARDIOGRAM (TEE);  Surgeon: Thayer Headings, MD;  Location: [facility] ORS;  Service: Cardiovascular;  Laterality: N/A;  . Electrophysiologic study N/A 10/18/2014    Procedure: CARDIOVERSION;  Surgeon: Thayer Headings, MD;  Location: [facility] ORS;  Service: Cardiovascular;  Laterality: N/A;   Past Medical History  Diagnosis Date  . Hypertension   . Heart murmur   . Anxiety   . Arrhythmia   . Arthritis   . Atrial fibrillation ([place])   . Acute heart failure ([place])   . Hyperlipemia   . Stroke ([place])    BP 168/98 mmHg  Pulse 86  Resp 16  SpO2 98%  LMP  (LMP Unknown)  Opioid Risk Score:   Fall Risk Score:  `1  Depression screen PHQ  2/9  Depression screen PHQ 2/9 10/02/2015 07/31/2015 03/13/2015 02/02/2015 12/12/2014 10/31/2014 02/01/2014  Decreased Interest 0 0 0 0 0 0 0  Down, Depressed, Hopeless 0 0 0 0 1 0 0  PHQ - 2 Score 0 0 0 0 1 0 0       Review of Systems  Constitutional: Negative.   HENT: Negative.   Cardiovascular:       Limb swelling   Endocrine: Negative.   Genitourinary: Positive for difficulty urinating.  Allergic/Immunologic: Negative.   Neurological: Negative.   Hematological: Negative.   Psychiatric/Behavioral: Negative.   All other systems reviewed and are negative.      Objective:   Physical Exam  HEENT: Normocephalic, atraumatic  Cardio: RRR and no murmur  Resp: CTA B/L and unlabored  GI: BS positive and NT, ND  Skin: Intact. Warm and dry.  Neuro: Confused Motor 3+ Right deltoid, 4 R biceps and triceps and 4 grip, 4 +R HF, KE, 3+ ankle DF,  Improving FMC Ataxic/ dec FMC and Dysarthric slightly  Musc/Skel:right knee tender posteriorly. Varicose veins palpable. No swelling hamstring a little tight. Does better after she walks for a while. Still with some difficulties advancing either leg. Very slow when walking without cane.  Gen NAD. vital signs reviewed.   Assessment & Plan:   1. Right facial weakness and right hemiparesis with inability to carry out ADL tasks as well as gait disorder secondary to left basal ganglia stroke.  - continue outpt PT -cane for balance 2. DVT Prophylaxis/Anticoagulation: remains on eliquis  3. Pain Management: tylenol prn for pain, heat and ice  4. Mood: Very motivated. LCSW to follow for evaluation and support.  5. Neuropsych: This patient is capable of making decisions on his own behalf.  6. Skin/Wound Care: Routine pressure relief measures.  7. Fluids/Electrolytes/Nutrition: good appetite now.  10. HTN: Per primary  11. Chronic systolic CHF/afib:per primary.  12. IBS-D: probiotic. Diet mgt.  13. Spasticity- PRN qhs tizanidine effective  14. Right knee pain---tight hamstrings, varicose veins  -OTC creams  -voltaren gel.  -PT for modalities and stretching.    Thirty minutes of face to face patient care time were spent during this visit. All questions were encouraged and answered.  Follow up in 4 months

## 2015-10-04 ENCOUNTER — Ambulatory Visit: Payer: Medicare PPO | Attending: Physical Medicine & Rehabilitation | Admitting: Occupational Therapy

## 2015-10-04 DIAGNOSIS — M6281 Muscle weakness (generalized): Secondary | ICD-10-CM | POA: Diagnosis not present

## 2015-10-04 DIAGNOSIS — R278 Other lack of coordination: Secondary | ICD-10-CM | POA: Diagnosis not present

## 2015-10-04 NOTE — Patient Instructions (Signed)
OT TREATMENT    Neuro muscular re-education:  Pt. performed [place] tasks using the grooved pegboard. Pt. worked on grasping the grooved pegs from a horizontal position, and moving the pegs to a vertical position in the hand to prepare for placing them in the grooved slot. Pt. Required increased time, and cues to place the groove into the correct slot. Pt. had difficulty with turning the peg to fit it into the proper position.   Therapeutic Exercise:  Pt. worked on SunGard for shoulder flexion using 1# dowel. Pt. worked on SunGard shoulder flexion while standing at the wall. Pt. required visual demonstration, visual, and verbal cues for the proper form and movement. Pt. performed gross gripping with grip strengthener. Pt. worked on sustaining grip while grasping pegs and reaching at various heights. Pt. Gripper was placed in initially in the 2nd resistive slot, and was graded down to the 1st resistive slot. Pt. Worked on

## 2015-10-04 NOTE — Therapy (Signed)
[place] MAIN [facility] SERVICES [address] [place], [place], [address] Phone: [phone]   Fax:  [phone]  Occupational Therapy Treatment  Patient Details  Name: Karen Bowman MRN: [id number] Date of Birth: 05/01/1940 Referring Provider: Naaman Plummer  Encounter Date: 10/04/2015      OT End of Session - 10/04/15 1721    Visit Number 2   Number of Visits 24   Date for OT Re-Evaluation 12/20/15   Authorization Type 2 Medicare G code of 10   OT Start Time [id number]   OT Stop Time 1515   OT Time Calculation (min) 33 min   Activity Tolerance Patient tolerated treatment well   Behavior During Therapy [facility] for tasks assessed/performed      Past Medical History  Diagnosis Date  . Hypertension   . Heart murmur   . Anxiety   . Arrhythmia   . Arthritis   . Atrial fibrillation ([place])   . Acute heart failure ([place])   . Hyperlipemia   . Stroke [facility])     Past Surgical History  Procedure Laterality Date  . Tonsillectomy    . Appendectomy    . Tumor removal      right arm  . Bladder suspension    . Abdominal hysterectomy    . Tee without cardioversion N/A 10/18/2014    Procedure: TRANSESOPHAGEAL ECHOCARDIOGRAM (TEE);  Surgeon: Thayer Headings, MD;  Location: [facility] ORS;  Service: Cardiovascular;  Laterality: N/A;  . Electrophysiologic study N/A 10/18/2014    Procedure: CARDIOVERSION;  Surgeon: Thayer Headings, MD;  Location: [facility] ORS;  Service: Cardiovascular;  Laterality: N/A;    There were no vitals filed for this visit.      Subjective Assessment - 10/04/15 1709    Subjective  Pt. reports having to had to work with the schedule.   Patient is accompained by: Family member   Pertinent History Pt. is a 75 y.o. female who was admitted to [facility] on 06/28/2015 with a CVA and right sided hemiplegia. Pt. has a history of HTN, chronic systolic congestive heart failure, and A fib. Pt. had inpatient rehab services, as well as [place] services. Pt. now  presents for outpatient rehab services.   Limitations Dominant RUE strength and coordination.   Currently in Pain? No/denies      OT TREATMENT    Neuro muscular re-education:  Pt. performed [place] tasks using the grooved pegboard. Pt. worked on grasping the grooved pegs from a horizontal position, and moving the pegs to a vertical position in the hand to prepare for placing them in the grooved slot. Pt. Required increased time, and cues to place the groove into the correct slot. Pt. had difficulty with turning the peg to fit it into the proper position.   Therapeutic Exercise:  Pt. worked on SunGard for shoulder flexion using 1# dowel. Pt. worked on SunGard shoulder flexion while standing at the wall. Pt. required visual demonstration, visual, and verbal cues for the proper form and movement. Pt. performed gross gripping with grip strengthener. Pt. worked on sustaining grip while grasping pegs and reaching at various heights. Pt. Gripper was placed in initially in the 2nd resistive slot, and was graded down to the 1st resistive slot. Pt. Worked on                                   OT Long Term Goals - 09/27/15 2003  OT LONG TERM GOAL #1   Title Pt. will increase AROM in shoulder flexion by 10 degrees to be able to reach into kitchen cabinetry.   Baseline Shoulder flexion: [id number])   Time 12   Period Weeks   Status New   OT LONG TERM GOAL #2   Title Pt. will increase right grip strength by 10# to be able to open jars.   Baseline 13#, Pt. has difficulty oopening jars.   Time 12   Period Weeks   Status New   OT LONG TERM GOAL #3   Title Pt. will improve right lateral pinch strength by 3# to be able to cut meat and veggies efficiently in preparation for a meal.   Baseline 9#, Pt. requires increased time to complete with difficulty.   Time 12   Period Weeks   Status New   OT LONG TERM GOAL #4   Title Pt. improve right [place] by 5 sec. of speed on the 9-hole peg  test to be able to fasten a bra.   Baseline 38 sec. Pt. has difficulty fastening a bra.   Time 12   Period Weeks   Status New   OT LONG TERM GOAL #5   Title Pt. will be able to write 3 sentences efficiently with 100% legibility.   Baseline 50% legibilty   Time 12   Period Weeks   Status New               Plan - 10/04/15 1722    Clinical Impression Statement Pt. continues to present with right shoulder weakness, impaired right grip strength, and impaired [facility] skills shich continue to hinder her ability to reach into cabintery,  firmly grasp and hold utensils for meal or cooking preparation, and manipulating fasteners on clothing. Pt. contiunes to be motivated to improve right UE functioning for improving ADL and IADL skills.    Rehab Potential Good   Clinical Impairments Affecting Rehab Potential Positive Indicators: age, family support, and motivation. Negative Indicators: multiple comorbidities.   OT Frequency 2x / week   OT Duration 12 weeks   OT Treatment/Interventions Self-care/ADL training;Therapeutic exercise;Therapeutic exercises;Patient/family education;Therapeutic activities;DME and/or AE instruction;Passive range of motion;Functional Mobility Training;Moist Heat;Ultrasound;Manual Therapy;Electrical Stimulation      Patient will benefit from skilled therapeutic intervention in order to improve the following deficits and impairments:  Pain, Decreased activity tolerance, Decreased balance, Impaired UE functional use, Impaired tone, Decreased coordination, Decreased strength, Decreased range of motion, Decreased knowledge of use of DME  Visit Diagnosis: Other lack of coordination  Muscle weakness (generalized)    Problem List Patient Active Problem List   Diagnosis Date Noted  . Posterior right knee pain 10/02/2015  . Cerebrovascular accident (CVA) due to thrombosis of left middle cerebral artery ([place]) 09/11/2015  . Chronic anticoagulation 09/11/2015  . Essential  hypertension 09/11/2015  . HLD (hyperlipidemia) 09/11/2015  . Muscle spasticity   . CVA (cerebral infarction) 07/03/2015  . Hemiparesis affecting dominant side as late effect of stroke ([place])   . Gait disturbance, post-stroke   . Paroxysmal atrial fibrillation ([facility])   . Dyslipidemia   . Benign essential HTN   . Chronic systolic congestive heart failure ([place])   . IBS (irritable bowel syndrome)   . Cerebrovascular accident (CVA) due to occlusion of cerebral artery ([place])   . Counseling regarding end of life decision making 02/02/2015  . Atrophic vaginitis 12/23/2014  . Pelvic pressure in female 12/23/2014  . Urethral caruncle 12/23/2014  . Cystocele, midline 12/23/2014  .  Bradycardia 12/12/2014  . Junctional rhythm 12/09/2014  . Chronic systolic heart failure ([place]) 10/31/2014  . A-fib ([place]) 10/14/2014  . Routine general medical examination at a health care facility 02/01/2014  . Irritable bowel syndrome (IBS) 04/09/2011  . POSTMENOPAUSAL STATUS 02/02/2009  . HYPERLIPIDEMIA 12/26/2006  . HYPERTENSION, BENIGN ESSENTIAL 12/26/2006  . ARTHRITIS, RHEUMATOID 12/26/2006   Harrel Carina, MS, OTR/L  Harrel Carina 10/04/2015, 5:31 PM  [place] MAIN [facility] SERVICES [address] [place], [place], [address] Phone: [phone]   Fax:  [phone]  Name: Karen Bowman MRN: [id number] Date of Birth: 1940/05/25

## 2015-10-05 ENCOUNTER — Ambulatory Visit: Payer: Medicare PPO | Admitting: Physical Therapy

## 2015-10-05 ENCOUNTER — Encounter: Payer: Medicare PPO | Admitting: Occupational Therapy

## 2015-10-09 ENCOUNTER — Ambulatory Visit: Payer: Medicare PPO | Admitting: Physical Therapy

## 2015-10-10 ENCOUNTER — Ambulatory Visit: Payer: Medicare PPO | Admitting: Occupational Therapy

## 2015-10-10 DIAGNOSIS — M6281 Muscle weakness (generalized): Secondary | ICD-10-CM

## 2015-10-10 DIAGNOSIS — R278 Other lack of coordination: Secondary | ICD-10-CM | POA: Diagnosis not present

## 2015-10-11 ENCOUNTER — Other Ambulatory Visit: Payer: Self-pay | Admitting: Cardiovascular Disease

## 2015-10-11 ENCOUNTER — Encounter: Payer: Self-pay | Admitting: Occupational Therapy

## 2015-10-11 NOTE — Therapy (Signed)
[place] MAIN [facility] SERVICES [address] [place], [place], [address] Phone: [phone]   Fax:  [phone]  Occupational Therapy Treatment  Patient Details  Name: Karen Bowman MRN: [id number] Date of Birth: 11-18-1940 Referring Provider: Naaman Plummer  Encounter Date: 10/10/2015      OT End of Session - 10/11/15 2311    Visit Number 3   Number of Visits 24   Date for OT Re-Evaluation 12/20/15   Authorization Type 3 Medicare G code of 10   OT Start Time 1600   OT Stop Time 1645   OT Time Calculation (min) 45 min   Activity Tolerance Patient tolerated treatment well   Behavior During Therapy [facility] for tasks assessed/performed      Past Medical History  Diagnosis Date  . Hypertension   . Heart murmur   . Anxiety   . Arrhythmia   . Arthritis   . Atrial fibrillation ([place])   . Acute heart failure ([place])   . Hyperlipemia   . Stroke [facility])     Past Surgical History  Procedure Laterality Date  . Tonsillectomy    . Appendectomy    . Tumor removal      right arm  . Bladder suspension    . Abdominal hysterectomy    . Tee without cardioversion N/A 10/18/2014    Procedure: TRANSESOPHAGEAL ECHOCARDIOGRAM (TEE);  Surgeon: Thayer Headings, MD;  Location: [facility] ORS;  Service: Cardiovascular;  Laterality: N/A;  . Electrophysiologic study N/A 10/18/2014    Procedure: CARDIOVERSION;  Surgeon: Thayer Headings, MD;  Location: [facility] ORS;  Service: Cardiovascular;  Laterality: N/A;    There were no vitals filed for this visit.      Subjective Assessment - 10/11/15 2309    Subjective  Patient reports she is doing well she prefers riding with a specific pen at home rather than ballpoint pen. She also reports she was able to thread a needle the other day. Feels she is improving with strength and coordination.    Pertinent History Pt. is a 75 y.o. female who was admitted to [facility] on 06/28/2015 with a CVA and right sided hemiplegia. Pt. has a history  of HTN, chronic systolic congestive heart failure, and A fib. Pt. had inpatient rehab services, as well as [place] services. Pt. now presents for outpatient rehab services.   Currently in Pain? No/denies                      OT Treatments/Exercises (OP) - 10/11/15 2310    Cognitive Exercises   Handwriting Patient participated in hand writing exercises with focus on printing her first and last name as well as in script.    Fine Motor Coordination   Other Fine Motor Exercises Patient participating in the fine motor coordination exercises with grooved pegs with emphasis on prehension patterns to pick up and place into specifics slots, translatory skills on the right hand and using the hand for storage. Patient seen for manipulation of Judy board pegs for flipping and turning from one end to the opposite with cues.     Neurological Re-education Exercises   Other Exercises 1 Patient was seen this date for occupational therapy with focus on upper body strengthening exercises with 1# dowel for bilateral shoulder flexion, chest press, abduction/add duction forwards circles, backwards in circles for 10 repetitions for one set each. Cues for technique during exercises  OT Education - 10/11/15 2311    Education provided Yes   Education Details HEP, coordination   Person(s) Educated Patient   Methods Explanation;Demonstration;Verbal cues   Comprehension Verbal cues required;Returned demonstration;Verbalized understanding             OT Long Term Goals - 09/27/15 2003    OT LONG TERM GOAL #1   Title Pt. will increase AROM in shoulder flexion by 10 degrees to be able to reach into kitchen cabinetry.   Baseline Shoulder flexion: [id number])   Time 12   Period Weeks   Status New   OT LONG TERM GOAL #2   Title Pt. will increase right grip strength by 10# to be able to open jars.   Baseline 13#, Pt. has difficulty oopening jars.   Time 12   Period Weeks   Status  New   OT LONG TERM GOAL #3   Title Pt. will improve right lateral pinch strength by 3# to be able to cut meat and veggies efficiently in preparation for a meal.   Baseline 9#, Pt. requires increased time to complete with difficulty.   Time 12   Period Weeks   Status New   OT LONG TERM GOAL #4   Title Pt. improve right [place] by 5 sec. of speed on the 9-hole peg test to be able to fasten a bra.   Baseline 38 sec. Pt. has difficulty fastening a bra.   Time 12   Period Weeks   Status New   OT LONG TERM GOAL #5   Title Pt. will be able to write 3 sentences efficiently with 100% legibility.   Baseline 50% legibilty   Time 12   Period Weeks   Status New               Plan - 10/11/15 2312    Clinical Impression Statement Patient has made significant progress in her coordination skills of the right hand. Her hand writing is becoming more legible in both print and script. She is slow to complete tasks and needs additional focus on producing speed and improved [place] of the right hand to complete the necessary daily tasks with greater efficiency. She continues to benefit from skilled occupational therapy to focus on these areas.    Rehab Potential Good   Clinical Impairments Affecting Rehab Potential Positive Indicators: age, family support, and motivation. Negative Indicators: multiple comorbidities.   OT Frequency 2x / week   OT Duration 12 weeks   OT Treatment/Interventions Self-care/ADL training;Therapeutic exercise;Therapeutic exercises;Patient/family education;Therapeutic activities;DME and/or AE instruction;Passive range of motion;Functional Mobility Training;Moist Heat;Ultrasound;Manual Therapy;Nurse, adult and Agree with Plan of Care Patient      Patient will benefit from skilled therapeutic intervention in order to improve the following deficits and impairments:  Pain, Decreased activity tolerance, Decreased balance, Impaired UE functional use, Impaired  tone, Decreased coordination, Decreased strength, Decreased range of motion, Decreased knowledge of use of DME  Visit Diagnosis: Muscle weakness (generalized)  Other lack of coordination    Problem List Patient Active Problem List   Diagnosis Date Noted  . Posterior right knee pain 10/02/2015  . Cerebrovascular accident (CVA) due to thrombosis of left middle cerebral artery ([place]) 09/11/2015  . Chronic anticoagulation 09/11/2015  . Essential hypertension 09/11/2015  . HLD (hyperlipidemia) 09/11/2015  . Muscle spasticity   . CVA (cerebral infarction) 07/03/2015  . Hemiparesis affecting dominant side as late effect of stroke ([place])   . Gait disturbance, post-stroke   . Paroxysmal  atrial fibrillation ([place])   . Dyslipidemia   . Benign essential HTN   . Chronic systolic congestive heart failure ([place])   . IBS (irritable bowel syndrome)   . Cerebrovascular accident (CVA) due to occlusion of cerebral artery ([place])   . Counseling regarding end of life decision making 02/02/2015  . Atrophic vaginitis 12/23/2014  . Pelvic pressure in female 12/23/2014  . Urethral caruncle 12/23/2014  . Cystocele, midline 12/23/2014  . Bradycardia 12/12/2014  . Junctional rhythm 12/09/2014  . Chronic systolic heart failure ([place]) 10/31/2014  . A-fib ([place]) 10/14/2014  . Routine general medical examination at a health care facility 02/01/2014  . Irritable bowel syndrome (IBS) 04/09/2011  . POSTMENOPAUSAL STATUS 02/02/2009  . HYPERLIPIDEMIA 12/26/2006  . HYPERTENSION, BENIGN ESSENTIAL 12/26/2006  . ARTHRITIS, RHEUMATOID 12/26/2006   Achilles Dunk, OTR/L, CLT  Karen Bowman 10/11/2015, 11:14 PM  [place] MAIN [facility] SERVICES [address] [place], [place], [address] Phone: [phone]   Fax:  [phone]  Name: Karen Bowman MRN: [id number] Date of Birth: [birth year]

## 2015-10-12 ENCOUNTER — Telehealth: Payer: Self-pay | Admitting: Family Medicine

## 2015-10-12 ENCOUNTER — Ambulatory Visit: Payer: Medicare PPO | Admitting: Physical Therapy

## 2015-10-12 DIAGNOSIS — E7800 Pure hypercholesterolemia, unspecified: Secondary | ICD-10-CM

## 2015-10-12 NOTE — Telephone Encounter (Signed)
-----   Message from Marchia Bond sent at 10/12/2015  9:41 AM EDT ----- Regarding: ? about labs on 7/19, Thanks!! :-) Pt is scheduled for 3 mo chol check after starting lipitor. Do you only want lipids or did you also want liver fxn? There weren't orders for either, so I just wanted to check.  Thanks Aniceto Boss

## 2015-10-16 ENCOUNTER — Ambulatory Visit: Payer: Medicare PPO | Admitting: Physical Therapy

## 2015-10-16 ENCOUNTER — Encounter: Payer: Medicare PPO | Admitting: Occupational Therapy

## 2015-10-17 ENCOUNTER — Emergency Department
Admission: EM | Admit: 2015-10-17 | Discharge: 2015-10-17 | Disposition: A | Discharge status: Home / Self Care | Payer: Medicare PPO | Attending: Emergency Medicine | Admitting: Emergency Medicine

## 2015-10-17 ENCOUNTER — Encounter: Payer: Self-pay | Admitting: Emergency Medicine

## 2015-10-17 ENCOUNTER — Ambulatory Visit: Payer: Medicare PPO | Admitting: Occupational Therapy

## 2015-10-17 DIAGNOSIS — Z7982 Long term (current) use of aspirin: Secondary | ICD-10-CM | POA: Diagnosis not present

## 2015-10-17 DIAGNOSIS — I83891 Varicose veins of right lower extremities with other complications: Secondary | ICD-10-CM

## 2015-10-17 DIAGNOSIS — Z8673 Personal history of transient ischemic attack (TIA), and cerebral infarction without residual deficits: Secondary | ICD-10-CM | POA: Diagnosis not present

## 2015-10-17 DIAGNOSIS — I4891 Unspecified atrial fibrillation: Secondary | ICD-10-CM | POA: Diagnosis not present

## 2015-10-17 DIAGNOSIS — I5022 Chronic systolic (congestive) heart failure: Secondary | ICD-10-CM | POA: Diagnosis not present

## 2015-10-17 DIAGNOSIS — T148 Other injury of unspecified body region: Secondary | ICD-10-CM | POA: Diagnosis not present

## 2015-10-17 DIAGNOSIS — S99929A Unspecified injury of unspecified foot, initial encounter: Secondary | ICD-10-CM | POA: Diagnosis not present

## 2015-10-17 DIAGNOSIS — I110 Hypertensive heart disease with heart failure: Secondary | ICD-10-CM | POA: Insufficient documentation

## 2015-10-17 DIAGNOSIS — Z79899 Other long term (current) drug therapy: Secondary | ICD-10-CM | POA: Diagnosis not present

## 2015-10-17 DIAGNOSIS — E785 Hyperlipidemia, unspecified: Secondary | ICD-10-CM | POA: Insufficient documentation

## 2015-10-17 DIAGNOSIS — I8391 Asymptomatic varicose veins of right lower extremity: Secondary | ICD-10-CM | POA: Diagnosis not present

## 2015-10-17 DIAGNOSIS — M069 Rheumatoid arthritis, unspecified: Secondary | ICD-10-CM | POA: Insufficient documentation

## 2015-10-17 NOTE — ED Notes (Signed)
Patient states she is on Eloquis from previous stroke. States today she had varicose vein bust and could not get it to stop bleeding. Patient has gauze bandage applied with no bleeding through. Patient denies any injury to leg. Patient was hypertensive for EMS (and mildly hypertensive here in triage), but states she was very anxious because she's frustrated for having to come. Has had first dose of HTN medication today (takes BID).

## 2015-10-17 NOTE — Discharge Instructions (Signed)
Bleeding Varicose Veins Varicose veins are veins that have become enlarged and twisted. Valves in the veins help return blood from the leg to the heart. If these valves are damaged, blood flows backward and backs up into the veins in the leg near the skin. This causes the veins to become larger because of increased pressure within them. Sometimes these veins bleed. CAUSES  Factors that can lead to bleeding varicose veins include:  Thinning of the skin that covers the veins. This skin is stretched as the veins enlarge.  Weak and thinning walls of the varicose veins. These thin walls are part of the reason why blood is not flowing normally to the heart.  Having high pressure in the veins. This high pressure occurs because the blood is not flowing freely back up to the heart.  Injury. Even a small injury to a varicose vein can cause bleeding.  Open wounds. A sore may develop near a varicose vein and not heal. This makes bleeding more likely.  Taking medicine that thins the blood. These medicines may include aspirin, anti-inflammatory medicine, and other blood thinners. SIGNS AND SYMPTOMS  If bleeding is on the outside surface of the skin, blood can be seen. Sometimes, the bleeding stays under the skin. If this happens, the blue or purple area will spread beyond the vein. This discoloration may be visible. DIAGNOSIS  To decide if you have a bleeding varicose vein, your health care provider may:  Ask about your symptoms. This will include when you first saw bleeding.  Ask about how long you have had varicose veins and if they cause you problems.  Ask about your overall health.  Ask about possible causes, such as recent cuts or if the area near the varicose veins was bumped or injured.  Examine the skin or leg that concerns you. Your health care provider will probably feel the veins.  Order imaging tests. These create detailed pictures of the veins. TREATMENT  The first goal of treating  bleeding varicose veins is to stop the bleeding. Then, the aim is to keep any bleeding from happening again. Treatment will depend on the cause of the bleeding and how bad it is. Ask your health care provider about what would be best for you. Options include:  Raising (elevating) your leg. Lie down with your leg propped up on a pillow or cushion. Your foot should be above the level of your heart.  Applying pressure to the spot that is bleeding. The bleeding should stop in a short time.  Wearing elastic stockings that "compress" your legs (compression stockings). An elastic bandage may do the same thing.  Applying an antibiotic cream on sores that are not healing.  Closing off or surgically removing the bleeding varicose veins with one of the following:  Sclerotherapy. A solution is injected into the vein to close it off.  Laser treatment. A laser is used to heat the vein to close it off.  Radiofrequency vein ablation. An electrical current produced by radio waves is used to close off the vein.  Phlebectomy. The vein is surgically removed through small incisions made over the varicose vein.  Vein ligation and stripping. The vein is surgically removed through incisions made over the varicose vein after the vein has been tied (ligated). HOME CARE INSTRUCTIONS   Apply any creams that your health care provider prescribed. Follow the directions carefully.  Wear compression stockings or any wraps as directed by your health care provider. Make sure you know:  If   you should wear them every day.  How long you should wear them.  If veins were removed or closed, a bandage (dressing) will probably cover the area. Make sure you know:  How often the dressing should be changed.  Whether the area can get wet.  When you can leave the skin uncovered.  Check your skin every day. Look for new sores and signs of bleeding.  To prevent future bleeding:  Use extra care in situations where you could  cut your legs, such as when shaving or gardening.  Try to keep your legs elevated as much as possible. Lie down when you can. SEEK MEDICAL CARE IF:   Your veins continue to bleed.  You develop new sores near your varicose veins.  You have a sore that does not heal or gets bigger.  You have increased pain in your leg.  The area around a varicose vein becomes warm, red, or tender to the touch.  You notice a yellowish fluid that smells bad coming from a spot where there was bleeding.  You have a fever. SEEK IMMEDIATE MEDICAL CARE IF:   You have chest pain or difficulty breathing.  You have severe leg pain.   This information is not intended to replace advice given to you by your health care provider. Make sure you discuss any questions you have with your health care provider.   Document Released: 08/04/2008 Document Revised: 04/08/2014 Document Reviewed: 07/20/2013 Elsevier Interactive Patient Education 2016 [employer]  

## 2015-10-17 NOTE — ED Provider Notes (Signed)
[facility] Emergency Department Provider Note  ____________________________________________  Time seen: Approximately 4:23 PM  I have reviewed the triage vital signs and the nursing notes.   HISTORY  Chief Complaint Leg Injury   HPI Karen Bowman is a 75 y.o. female who presents to the emergency department for evaluation of bleeding from a varicose vein. She states that after showering today, she was drying herself off and the area began to bleed. She states that she feels she "lost a whole pint of blood." She is has been unable to control the bleeding with pressure. She is taking Eliquis for a recent CVA.   Past Medical History  Diagnosis Date  . Hypertension   . Heart murmur   . Anxiety   . Arrhythmia   . Arthritis   . Atrial fibrillation ([place])   . Acute heart failure ([place])   . Hyperlipemia   . Stroke [facility])     Patient Active Problem List   Diagnosis Date Noted  . Posterior right knee pain 10/02/2015  . Cerebrovascular accident (CVA) due to thrombosis of left middle cerebral artery ([place]) 09/11/2015  . Chronic anticoagulation 09/11/2015  . Essential hypertension 09/11/2015  . HLD (hyperlipidemia) 09/11/2015  . Muscle spasticity   . CVA (cerebral infarction) 07/03/2015  . Hemiparesis affecting dominant side as late effect of stroke ([place])   . Gait disturbance, post-stroke   . Paroxysmal atrial fibrillation ([facility])   . Dyslipidemia   . Benign essential HTN   . Chronic systolic congestive heart failure ([place])   . IBS (irritable bowel syndrome)   . Cerebrovascular accident (CVA) due to occlusion of cerebral artery ([place])   . Counseling regarding end of life decision making 02/02/2015  . Atrophic vaginitis 12/23/2014  . Pelvic pressure in female 12/23/2014  . Urethral caruncle 12/23/2014  . Cystocele, midline 12/23/2014  . Bradycardia 12/12/2014  . Junctional rhythm 12/09/2014  . Chronic systolic heart failure ([place]) 10/31/2014  . A-fib ([place])  10/14/2014  . Routine general medical examination at a health care facility 02/01/2014  . Irritable bowel syndrome (IBS) 04/09/2011  . POSTMENOPAUSAL STATUS 02/02/2009  . HYPERLIPIDEMIA 12/26/2006  . HYPERTENSION, BENIGN ESSENTIAL 12/26/2006  . ARTHRITIS, RHEUMATOID 12/26/2006    Past Surgical History  Procedure Laterality Date  . Tonsillectomy    . Appendectomy    . Tumor removal      right arm  . Bladder suspension    . Abdominal hysterectomy    . Tee without cardioversion N/A 10/18/2014    Procedure: TRANSESOPHAGEAL ECHOCARDIOGRAM (TEE);  Surgeon: Thayer Headings, MD;  Location: [facility] ORS;  Service: Cardiovascular;  Laterality: N/A;  . Electrophysiologic study N/A 10/18/2014    Procedure: CARDIOVERSION;  Surgeon: Thayer Headings, MD;  Location: [facility] ORS;  Service: Cardiovascular;  Laterality: N/A;    Current Outpatient Rx  Name  Route  Sig  Dispense  Refill  . amiodarone (PACERONE) 200 MG tablet   Oral   Take 0.5 tablets (100 mg total) by mouth daily.   90 tablet   3   . amLODipine (NORVASC) 2.5 MG tablet   Oral   Take 2 tablets (5 mg total) by mouth daily. Patient taking differently: Take 2.5 mg by mouth daily.    90 tablet   3   . aspirin EC 81 MG EC tablet   Oral   Take 1 tablet (81 mg total) by mouth daily.   30 tablet   0   . atorvastatin (LIPITOR) 40 MG tablet  Oral   Take 1 tablet (40 mg total) by mouth daily at 6 PM.   90 tablet   3   . chlorthalidone (HYGROTON) 25 MG tablet   Oral   Take 0.5-1 tablets (12.5-25 mg total) by mouth daily as needed (for swelling).   30 tablet   0   . Cholecalciferol 1000 UNITS capsule   Oral   Take 2,000 Units by mouth daily.          Marland Kitchen CINNAMON PO   Oral   Take 2 tablets by mouth daily.          . Coenzyme Q10-Fish Oil-Vit E (CO-Q 10 OMEGA-3 FISH OIL) CAPS   Oral   Take 1 capsule by mouth 2 (two) times daily.         Marland Kitchen CRANBERRY PO   Oral   Take 1 tablet by mouth 2 (two) times daily.          .  diclofenac sodium (VOLTAREN) 1 % GEL   Topical   Apply 1 application topically 3 (three) times daily. Apply to posterior knee   3 Tube   4   . ELIQUIS 5 MG TABS tablet      TAKE 1 TABLET TWICE DAILY   180 tablet   3   . EYEBRIGHT PO   Oral   Take 1 tablet by mouth daily.         . Glucosamine-Chondroitin (OSTEO BI-FLEX REGULAR STRENGTH PO)   Oral   Take by mouth.         . levocetirizine (XYZAL) 5 MG tablet   Oral   Take 1 tablet (5 mg total) by mouth every evening.   90 tablet   1   . loratadine (CLARITIN) 10 MG tablet   Oral   Take 1 tablet (10 mg total) by mouth daily.   90 tablet   3   . metoprolol tartrate (LOPRESSOR) 25 MG tablet   Oral   Take 0.5 tablets (12.5 mg total) by mouth 2 (two) times daily.   90 tablet   3   . MISC NATURAL PRODUCTS PO   Oral   Take 1 tablet by mouth daily. Multivitamin-Dr. Talmage Coin         . OVER THE COUNTER MEDICATION   Oral   Take 2 tablets by mouth daily. Omega Q Plus w/resveratrol-Dr. Talmage Coin         . tiZANidine (ZANAFLEX) 2 MG tablet   Oral   Take 1 tablet (2 mg total) by mouth at bedtime. Patient taking differently: Take 1 mg by mouth at bedtime.    90 tablet   0   . UNABLE TO FIND      Med Name/cidar vinegar otc         . UNABLE TO FIND      Med Name: slipppry elm         . UNABLE TO FIND      Med Name: jarrow bone         . UNABLE TO FIND      Med Name: Mk-7 artery circulation           Allergies Ciprofloxacin; Ciprofloxacin; Hydrochlorothiazide; Hydrochlorothiazide w-triamterene; Sulfa antibiotics; Sulfonamide derivatives; Valsartan; and Valsartan  Family History  Problem Relation Age of Onset  . Osteoporosis Mother   . Heart disease Mother   . Stroke Mother   . Cancer Father   . Diabetes Maternal Aunt     Social History Social History  Substance Use Topics  . Smoking status: Never Smoker   . Smokeless tobacco: Never Used  . Alcohol Use: No    Review of  Systems  Constitutional: Negative for fever/chills Respiratory: Negative for shortness of breath. Musculoskeletal: Negative for pain. Skin: Positive for bleeding on the right lower extremity. Neurological: Negative for headaches, focal weakness or numbness. ____________________________________________   PHYSICAL EXAM:  VITAL SIGNS: ED Triage Vitals  Enc Vitals Group     BP 10/17/15 1531 190/67 mmHg     Pulse Rate 10/17/15 1531 67     Resp 10/17/15 1531 18     Temp 10/17/15 1531 98.1 F (36.7 C)     Temp Source 10/17/15 1531 Oral     SpO2 10/17/15 1531 100 %     Weight 10/17/15 1531 128 lb (58.06 kg)     Height 10/17/15 1531 5\' 5"  (1.651 m)     Head Cir --      Peak Flow --      Pain Score --      Pain Loc --      Pain Edu? --      Excl. in [place]? --      Constitutional: Alert and oriented. Well appearing and in no acute distress. Eyes: Conjunctivae are normal. EOMI. Nose: No congestion/rhinnorhea. Mouth/Throat: Mucous membranes are moist.   Neck: No stridor. Cardiovascular: Good peripheral circulation. Respiratory: Normal respiratory effort.  No retractions. Musculoskeletal: FROM throughout. Neurologic:  Normal speech and language. No gross focal neurologic deficits are appreciated. Skin:  Venous bleeding noted from the right lower extremity near the medial malleolus.  ____________________________________________   LABS (all labs ordered are listed, but only abnormal results are displayed)  Labs Reviewed - No data to display ____________________________________________  EKG  n/a ____________________________________________  RADIOLOGY  n/a ____________________________________________   PROCEDURES  Procedure(s) performed:  Bleeding controlled immediately after using silver nitrate for cautery. Skin adhesive then applied over the area and allowed to dry for 15 minutes. Patient was then asked to ambulate around the department and there was no return of  bleeding. ____________________________________________   INITIAL IMPRESSION / Day / ED COURSE  Pertinent labs & imaging results that were available during my care of the patient were reviewed by me and considered in my medical decision making (see chart for details).  She will be advised to continue taking her Eliquis as prescribed. She states that she is fearful that the bleeding will return or another area will begin to bleed. She was educated about the potential adverse effects if she discontinues her medications, that includes another stroke.  She was advised to follow up with her primary care provider.  She was also advised to return to the emergency department for symptoms that change or worsen if unable to schedule an appointment.  ____________________________________________   FINAL CLINICAL IMPRESSION(S) / ED DIAGNOSES  Final diagnoses:  Bleeding from varicose vein, right    New Prescriptions   No medications on file     Victorino Dike, FNP 10/17/15 [place], MD 10/18/15 [phone]

## 2015-10-18 ENCOUNTER — Ambulatory Visit: Payer: Medicare PPO | Admitting: Physical Therapy

## 2015-10-18 ENCOUNTER — Other Ambulatory Visit (INDEPENDENT_AMBULATORY_CARE_PROVIDER_SITE_OTHER): Payer: Medicare PPO

## 2015-10-18 ENCOUNTER — Encounter: Payer: Medicare PPO | Admitting: Occupational Therapy

## 2015-10-18 ENCOUNTER — Telehealth: Payer: Self-pay | Admitting: Cardiovascular Disease

## 2015-10-18 DIAGNOSIS — E7800 Pure hypercholesterolemia, unspecified: Secondary | ICD-10-CM | POA: Diagnosis not present

## 2015-10-18 LAB — COMPREHENSIVE METABOLIC PANEL
ALBUMIN: 4.5 g/dL (ref 3.5–5.2)
ALK PHOS: 67 U/L (ref 39–117)
ALT: 26 U/L (ref 0–35)
AST: 26 U/L (ref 0–37)
BILIRUBIN TOTAL: 0.6 mg/dL (ref 0.2–1.2)
BUN: 15 mg/dL (ref 6–23)
CO2: 29 mEq/L (ref 19–32)
CREATININE: 0.82 mg/dL (ref 0.40–1.20)
Calcium: 9.7 mg/dL (ref 8.4–10.5)
Chloride: 104 mEq/L (ref 96–112)
GFR: 72.62 mL/min (ref >60.00)
Glucose, Bld: 93 mg/dL (ref 70–99)
POTASSIUM: 3.9 meq/L (ref 3.5–5.1)
SODIUM: 141 meq/L (ref 135–145)
TOTAL PROTEIN: 7.4 g/dL (ref 6.0–8.3)

## 2015-10-18 LAB — LIPID PANEL
CHOLESTEROL: 188 mg/dL (ref 0–200)
HDL: 64.9 mg/dL (ref >39.00)
LDL Cholesterol: 110 mg/dL — ABNORMAL HIGH (ref 0–99)
NonHDL: 122.92
Total CHOL/HDL Ratio: 3
Triglycerides: 63 mg/dL (ref 0.0–149.0)
VLDL: 12.6 mg/dL (ref 0.0–40.0)

## 2015-10-18 NOTE — Telephone Encounter (Signed)
Pt s/p CVA 07/2015. She would like to know if she can hold her Eliquis until her bleeding has resolved or if Dr. Rockey Situ has any other recommendations.

## 2015-10-18 NOTE — Telephone Encounter (Signed)
Patient called and is having problems with bleeding from a vein in her ankle (tiny spot) that she couldn't get to stop bleeding. It had to be cauderized at the [facility] ED via ambulance. She only took half of her ELIQUIS 5 MG TABS tablet plus her baby asprin. Patient is very concerned and not sure what to do?

## 2015-10-20 ENCOUNTER — Ambulatory Visit: Payer: Self-pay | Admitting: Family Medicine

## 2015-10-20 NOTE — Telephone Encounter (Signed)
Would stop the aspirin permanently Stopped the eliquis for only very short period of time perhaps day or 2 before restarting Prolonged periods off the will increase risk of recurrent stroke

## 2015-10-20 NOTE — Telephone Encounter (Signed)
Spoke w/ pt.  Advised her of Dr. Donivan Scull recommendation.  She states "that doesn't give me much confidence, do you want me to bleed to death in the street?" Discussed at length w/ pt her risk of stroke and after some hesitancy, she is agreeable to continuing Eliquis as prescribed. Asked her to call back w/ any further questions or concerns.

## 2015-10-24 ENCOUNTER — Encounter: Payer: Self-pay | Admitting: Family Medicine

## 2015-10-24 ENCOUNTER — Ambulatory Visit: Payer: Self-pay | Admitting: Physical Therapy

## 2015-10-24 ENCOUNTER — Encounter: Payer: Self-pay | Admitting: Occupational Therapy

## 2015-10-24 ENCOUNTER — Ambulatory Visit (INDEPENDENT_AMBULATORY_CARE_PROVIDER_SITE_OTHER): Payer: Medicare PPO | Admitting: Family Medicine

## 2015-10-24 DIAGNOSIS — I83891 Varicose veins of right lower extremities with other complications: Secondary | ICD-10-CM

## 2015-10-24 DIAGNOSIS — I4819 Other persistent atrial fibrillation: Secondary | ICD-10-CM

## 2015-10-24 DIAGNOSIS — I10 Essential (primary) hypertension: Secondary | ICD-10-CM | POA: Diagnosis not present

## 2015-10-24 DIAGNOSIS — I5022 Chronic systolic (congestive) heart failure: Secondary | ICD-10-CM

## 2015-10-24 DIAGNOSIS — E782 Mixed hyperlipidemia: Secondary | ICD-10-CM | POA: Diagnosis not present

## 2015-10-24 DIAGNOSIS — I481 Persistent atrial fibrillation: Secondary | ICD-10-CM

## 2015-10-24 NOTE — Patient Instructions (Addendum)
Wear compression hose to help with varicose veins.  Consider adding Zetia to lipitor to work on lowering cholesterol.  Call if interested in a prescription.  Work on low cholesterol diet and keep up great work with rehab.

## 2015-10-24 NOTE — Assessment & Plan Note (Signed)
Improved control on liptior. Not yet at goal LDL < 70, but pt congratulated. She refuses higher dose statin for better control but will consider zetia as recommended by cardiology. I encouraged pt to continue with medications despite her fears regarding prescribed medications. She will call to let me know if she is agreeable. Encouraged exercise, weight loss, healthy eating habits.  Recheck in 3 months.

## 2015-10-24 NOTE — Assessment & Plan Note (Signed)
Currently euvolmic on no diuretic.

## 2015-10-24 NOTE — Assessment & Plan Note (Signed)
No further bleeding. Pt on anticoagulation, has now stopped ASA. Recommended pt to use compression hose for support and prevention of further bleeds.

## 2015-10-24 NOTE — Assessment & Plan Note (Signed)
Well controlled. Continue current medication.  

## 2015-10-24 NOTE — Progress Notes (Signed)
Pre visit review using our clinic review tool, if applicable. No additional management support is needed unless otherwise documented below in the visit note. 

## 2015-10-24 NOTE — Assessment & Plan Note (Signed)
Rate controlled on current regimen. Pt continue eliqis. Has stopped ASA given bleeding varicose vein.

## 2015-10-24 NOTE — Progress Notes (Signed)
Subjective:    Patient ID: Karen Bowman, female    DOB: 16-Feb-1941, 75 y.o.   MRN: [id number]  HPI  75 year old female presents for follow up. CVA 06/27/2015 She is still rehab [facility].She is using a cane prn.  Last OV neuro Dr. [name]. 09/11/2015 Afib on Eliqus, rate controlled with amiodarone, digoxin and metoprolol CHF, new diagnosis given chlorthalidone to use prn swelling.  Last cardiologist appt 08/17/2015 recommended adding zetia if  chol not at goal. Wt Readings from Last 3 Encounters:  10/24/15 131 lb 8 oz (59.6 kg)  10/17/15 128 lb (58.1 kg)  09/11/15 131 lb 6.4 oz (59.6 kg)   She was seen at ER for bleeding varicose vein on 7/18, cauterized area.  No further bleeding.  She has since stopped ASA per cardiology.  Hypertension:    Well controlled on current regimen. BP Readings from Last 3 Encounters:  10/24/15 125/65  10/17/15 122/68  10/02/15 (!) 168/98  Using medication without problems or lightheadedness:  None Chest pain with exertion: None Edema:mild Short of breath: none Average home BPs: Other issues:  She has been tired but doing a lot each day.   Elevated Cholesterol:  At time of CVA she was started on  Lipitor 40 mg daily. LDL is much improved from 206/195 to 110, but not yet at goal < 70  CMET nml. Lab Results  Component Value Date   CHOL 188 10/18/2015   HDL 64.90 10/18/2015   LDLCALC 110 (H) 10/18/2015   LDLDIRECT 206.0 03/08/2013   TRIG 63.0 10/18/2015   CHOLHDL 3 10/18/2015  Using medications without problems:none Muscle aches: none Diet compliance: moderate Exercise: rehab Other complaints:    Review of Systems  Constitutional: Negative for fatigue and fever.  HENT: Negative for ear pain.   Eyes: Negative for pain.  Respiratory: Negative for chest tightness and shortness of breath.   Cardiovascular: Positive for leg swelling. Negative for chest pain and palpitations.  Gastrointestinal: Negative for abdominal pain.  Genitourinary:  Negative for dysuria.       Objective:   Physical Exam  Constitutional: She is oriented to person, place, and time. Vital signs are normal. She appears well-developed and well-nourished. She is cooperative.  Non-toxic appearance. She does not appear ill. No distress.  HENT:  Head: Normocephalic.  Right Ear: Hearing, tympanic membrane, external ear and ear canal normal. Tympanic membrane is not erythematous, not retracted and not bulging.  Left Ear: Hearing, tympanic membrane, external ear and ear canal normal. Tympanic membrane is not erythematous, not retracted and not bulging.  Nose: No mucosal edema or rhinorrhea. Right sinus exhibits no maxillary sinus tenderness and no frontal sinus tenderness. Left sinus exhibits no maxillary sinus tenderness and no frontal sinus tenderness.  Mouth/Throat: Uvula is midline, oropharynx is clear and moist and mucous membranes are normal.  Eyes: Conjunctivae, EOM and lids are normal. Pupils are equal, round, and reactive to light. Lids are everted and swept, no foreign bodies found.  Neck: Trachea normal and normal range of motion. Neck supple. Carotid bruit is not present. No thyroid mass and no thyromegaly present.  Cardiovascular: Normal rate, regular rhythm, S1 normal, S2 normal, normal heart sounds, intact distal pulses and normal pulses.  Exam reveals no gallop and no friction rub.   No murmur heard.  VAricose veins bilateral feet. 1+ pitting edema in right leg.    Pulmonary/Chest: Effort normal and breath sounds normal. No tachypnea. No respiratory distress. She has no decreased breath sounds.  She has no wheezes. She has no rhonchi. She has no rales.  Abdominal: Soft. Normal appearance and bowel sounds are normal. There is no tenderness.  Neurological: She is alert and oriented to person, place, and time. She has normal strength. She displays no atrophy. A sensory deficit is present. No cranial nerve deficit. Coordination and gait abnormal.   Improved  strength in upper ext and lower ext on right 5/5  Balance remains poor and gait is slowed.  Skin: Skin is warm, dry and intact. No rash noted.  Scab at site of bleeding varicosity on right medial ankle. No ulcer  Psychiatric: Her speech is normal and behavior is normal. Judgment and thought content normal. Her mood appears not anxious. Cognition and memory are normal. She does not exhibit a depressed mood.          Assessment & Plan:

## 2015-10-25 ENCOUNTER — Ambulatory Visit: Payer: Medicare PPO | Admitting: Occupational Therapy

## 2015-10-25 DIAGNOSIS — M6281 Muscle weakness (generalized): Secondary | ICD-10-CM | POA: Diagnosis not present

## 2015-10-25 DIAGNOSIS — R278 Other lack of coordination: Secondary | ICD-10-CM

## 2015-10-25 NOTE — Therapy (Signed)
[place] MAIN [facility] SERVICES [address] [place], [place], [address] Phone: [phone]   Fax:  [phone]  Occupational Therapy Treatment  Patient Details  Name: Karen Bowman MRN: [id number] Date of Birth: [birth year] Referring Provider: Naaman Plummer  Encounter Date: 10/25/2015      OT End of Session - 10/25/15 1722    Visit Number 4   Number of Visits 24   Date for OT Re-Evaluation 12/20/15   Authorization Type 4 Medicare G code of 10   OT Start Time [id number]   OT Stop Time 1430   OT Time Calculation (min) 28 min   Activity Tolerance Patient tolerated treatment well   Behavior During Therapy [facility] for tasks assessed/performed      Past Medical History:  Diagnosis Date  . Acute heart failure ([place])   . Anxiety   . Arrhythmia   . Arthritis   . Atrial fibrillation ([place])   . Heart murmur   . Hyperlipemia   . Hypertension   . Stroke [facility])     Past Surgical History:  Procedure Laterality Date  . ABDOMINAL HYSTERECTOMY    . APPENDECTOMY    . BLADDER SUSPENSION    . ELECTROPHYSIOLOGIC STUDY N/A 10/18/2014   Procedure: CARDIOVERSION;  Surgeon: Thayer Headings, MD;  Location: [facility] ORS;  Service: Cardiovascular;  Laterality: N/A;  . TEE WITHOUT CARDIOVERSION N/A 10/18/2014   Procedure: TRANSESOPHAGEAL ECHOCARDIOGRAM (TEE);  Surgeon: Thayer Headings, MD;  Location: [facility] ORS;  Service: Cardiovascular;  Laterality: N/A;  . TONSILLECTOMY    . TUMOR REMOVAL     right arm    There were no vitals filed for this visit.      Subjective Assessment - 10/25/15 1718    Subjective  Pt. reports having had thin blood last week causing her to have profuse bleeding at her ankle from a smal cut. Pt. was unable to attend therapy.   Patient is accompained by: Family member   Pertinent History Pt. is a 75 y.o. female who was admitted to [facility] on 06/28/2015 with a CVA and right sided hemiplegia. Pt. has a history of HTN, chronic systolic congestive  heart failure, and A fib. Pt. had inpatient rehab services, as well as [place] services. Pt. now presents for outpatient rehab services.   Limitations Dominant RUE strength and coordination.   Currently in Pain? No/denies                              OT Education - 10/25/15 1721    Education provided Yes   Person(s) Educated Patient   Methods Explanation;Demonstration;Verbal cues   Comprehension Verbalized understanding;Returned demonstration;Verbal cues required             OT Long Term Goals - 09/27/15 2003      OT LONG TERM GOAL #1   Title Pt. will increase AROM in shoulder flexion by 10 degrees to be able to reach into kitchen cabinetry.   Baseline Shoulder flexion: [id number])   Time 12   Period Weeks   Status New     OT LONG TERM GOAL #2   Title Pt. will increase right grip strength by 10# to be able to open jars.   Baseline 13#, Pt. has difficulty oopening jars.   Time 12   Period Weeks   Status New     OT LONG TERM GOAL #3   Title Pt. will improve right lateral pinch  strength by 3# to be able to cut meat and veggies efficiently in preparation for a meal.   Baseline 9#, Pt. requires increased time to complete with difficulty.   Time 12   Period Weeks   Status New     OT LONG TERM GOAL #4   Title Pt. improve right [place] by 5 sec. of speed on the 9-hole peg test to be able to fasten a bra.   Baseline 38 sec. Pt. has difficulty fastening a bra.   Time 12   Period Weeks   Status New     OT LONG TERM GOAL #5   Title Pt. will be able to write 3 sentences efficiently with 100% legibility.   Baseline 50% legibilty   Time 12   Period Weeks   Status New               Plan - 10/25/15 1723    Clinical Impression Statement Pt. continues to progress with UE ROM and coordination skills, however continues to have limited reach with shoulder flexion and abduction. Pt. continues to present with impaired fine motor coordination skills, translatory  movements, and thumb opposition to 2nd through 5th digits.   Rehab Potential Good   Clinical Impairments Affecting Rehab Potential Positive Indicators: age, family support, and motivation. Negative Indicators: multiple comorbidities.   OT Frequency 2x / week   OT Duration 12 weeks   OT Treatment/Interventions Self-care/ADL training;Therapeutic exercise;Therapeutic exercises;Patient/family education;Therapeutic activities;DME and/or AE instruction;Passive range of motion;Functional Mobility Training;Moist Heat;Ultrasound;Manual Therapy;Nurse, adult and Agree with Plan of Care Patient      Patient will benefit from skilled therapeutic intervention in order to improve the following deficits and impairments:  Pain, Decreased activity tolerance, Decreased balance, Impaired UE functional use, Impaired tone, Decreased coordination, Decreased strength, Decreased range of motion, Decreased knowledge of use of DME  Visit Diagnosis: No diagnosis found.    Problem List Patient Active Problem List   Diagnosis Date Noted  . Bleeding from varicose veins of lower extremity 10/24/2015  . Cerebrovascular accident (CVA) due to thrombosis of left middle cerebral artery ([place]) 09/11/2015  . Chronic anticoagulation 09/11/2015  . HLD (hyperlipidemia) 09/11/2015  . Muscle spasticity   . Hemiparesis affecting dominant side as late effect of stroke ([place])   . Gait disturbance, post-stroke   . Dyslipidemia   . Chronic systolic congestive heart failure ([place])   . Counseling regarding end of life decision making 02/02/2015  . Atrophic vaginitis 12/23/2014  . Pelvic pressure in female 12/23/2014  . Urethral caruncle 12/23/2014  . Cystocele, midline 12/23/2014  . Bradycardia 12/12/2014  . Junctional rhythm 12/09/2014  . Chronic systolic heart failure ([place]) 10/31/2014  . A-fib ([place]) 10/14/2014  . Irritable bowel syndrome (IBS) 04/09/2011  . POSTMENOPAUSAL STATUS 02/02/2009  .  HYPERLIPIDEMIA 12/26/2006  . HYPERTENSION, BENIGN ESSENTIAL 12/26/2006  . ARTHRITIS, RHEUMATOID 12/26/2006   Harrel Carina, MS, OTR/L  Harrel Carina 10/25/2015, 5:36 PM  [place] MAIN [facility] SERVICES [address] [place], [place], [address] Phone: [phone]   Fax:  [phone]  Name: Karen Bowman MRN: [id number] Date of Birth: [birth year]

## 2015-10-25 NOTE — Patient Instructions (Signed)
OT TREATMENT    Neuro muscular re-education:  Pt. Worked on improving [facility] skills with emphasis on grasping 2" sticks, storing the pegs, and translatory movements needed to move the straight sticks from the palm out to the tip of her 2nd digit and thumb. Pt. Worked on grasping 1/2" pegs and storing the  pegs with a round head in the palm of her hand. Pt. continues to require work on translatory movements of the hand.

## 2015-10-27 ENCOUNTER — Ambulatory Visit: Payer: Self-pay | Admitting: Physical Therapy

## 2015-10-27 ENCOUNTER — Encounter: Payer: Self-pay | Admitting: Occupational Therapy

## 2015-10-31 ENCOUNTER — Encounter: Payer: Self-pay | Admitting: Occupational Therapy

## 2015-10-31 ENCOUNTER — Ambulatory Visit: Payer: Medicare PPO | Attending: Physical Medicine & Rehabilitation | Admitting: Physical Therapy

## 2015-10-31 ENCOUNTER — Ambulatory Visit: Payer: Self-pay | Admitting: Physical Therapy

## 2015-10-31 ENCOUNTER — Encounter: Payer: Self-pay | Admitting: Physical Therapy

## 2015-10-31 ENCOUNTER — Ambulatory Visit: Payer: Medicare PPO | Attending: Physical Medicine & Rehabilitation | Admitting: Occupational Therapy

## 2015-10-31 DIAGNOSIS — R278 Other lack of coordination: Secondary | ICD-10-CM

## 2015-10-31 DIAGNOSIS — R29810 Facial weakness: Secondary | ICD-10-CM | POA: Diagnosis not present

## 2015-10-31 DIAGNOSIS — R2681 Unsteadiness on feet: Secondary | ICD-10-CM | POA: Insufficient documentation

## 2015-10-31 DIAGNOSIS — M6281 Muscle weakness (generalized): Secondary | ICD-10-CM | POA: Diagnosis not present

## 2015-10-31 DIAGNOSIS — R262 Difficulty in walking, not elsewhere classified: Secondary | ICD-10-CM | POA: Diagnosis not present

## 2015-10-31 NOTE — Patient Instructions (Addendum)
  HEP reviewed and given handout: STS, LAQs, seated march and seated clamshell with RTB 2x10. Created on [email or web address]

## 2015-10-31 NOTE — Patient Instructions (Addendum)
OT TREATMENT    Neuro muscular re-education;  Pt. Worked on [facility] skills grasping 1/4"pegs with the long nose tweezers. Pt. Worked on handling tweezers to place the pegs, and worked on removing them with alternating thumb opposition to the 2nd through 5th digits. Pt worked with grasping mini clips.Pt. Removed them with thumb opposition.  Therapeutic Exercise:  Pt. performed 2# dowel ex. For UE strengthening secondary to weakness. Bilateral shoulder flexion, chest press, circular patterns, and elbow flexion/extension were performed. 3# dumbbell ex. for elbow flexion and extension,  2# for forearm supination/pronation, wrist flexion/extension. Pt. requires rest breaks and verbal cues for proper technique.

## 2015-10-31 NOTE — Therapy (Addendum)
[place] MAIN [facility] SERVICES [address] [place], [place], [address] Phone: [phone]   Fax:  [phone]  Physical Therapy Evaluation  Patient Details  Name: Karen Bowman MRN: [id number] Date of Birth: 12-14-1940 No Data Recorded  Encounter Date: 10/31/2015      PT End of Session - 10/31/15 1531    Visit Number 1   Number of Visits 17   Date for PT Re-Evaluation 2015/12/23   Authorization Type g codes   Authorization Time Period 1/10   PT Start Time 1435   PT Stop Time 1520   PT Time Calculation (min) 45 min   Equipment Utilized During Treatment Gait belt      Past Medical History:  Diagnosis Date  . Acute heart failure ([place])   . Anxiety   . Arrhythmia   . Arthritis   . Atrial fibrillation ([place])   . Heart murmur   . Hyperlipemia   . Hypertension   . Stroke [facility])     Past Surgical History:  Procedure Laterality Date  . ABDOMINAL HYSTERECTOMY    . APPENDECTOMY    . BLADDER SUSPENSION    . ELECTROPHYSIOLOGIC STUDY N/A 10/18/2014   Procedure: CARDIOVERSION;  Surgeon: Thayer Headings, MD;  Location: [facility] ORS;  Service: Cardiovascular;  Laterality: N/A;  . TEE WITHOUT CARDIOVERSION N/A 10/18/2014   Procedure: TRANSESOPHAGEAL ECHOCARDIOGRAM (TEE);  Surgeon: Thayer Headings, MD;  Location: [facility] ORS;  Service: Cardiovascular;  Laterality: N/A;  . TONSILLECTOMY    . TUMOR REMOVAL     right arm    There were no vitals filed for this visit.       Subjective Assessment - 10/31/15 1444    Subjective Pt presents with a recent stroke on 06/27/15. She reports having remaining weakness, imbalance and some occasional pain in R LE.   Pertinent History CVA on 06/27/15, went to inpatient rehab, now coming to outpatient OT and PT, she now mainly ambulates wiht SPC, a lot of difficulty with stairs   Limitations Standing;Walking;House hold activities   How long can you stand comfortably? 1.5 hours   How long can you walk comfortably?  limited community distances with [facility]   Patient Stated Goals to strengthen legs, walk better, go up and down stairs   Currently in Pain? No/denies            [facility] PT Assessment - 10/31/15 1451      Assessment   Medical Diagnosis CVA   Onset Date/Surgical Date 06/27/15   Hand Dominance Right   Next MD Visit September or October, pt unsure   Prior Therapy inpatient rehab     Precautions   Precautions Fall     Restrictions   Weight Bearing Restrictions No     Balance Screen   Has the patient fallen in the past 6 months Yes   How many times? 2   Has the patient had a decrease in activity level because of a fear of falling?  Yes   Is the patient reluctant to leave their home because of a fear of falling?  No     Home Ecologist residence   Living Arrangements Spouse/significant other   Available Help at Discharge Family   Type of [place] to enter   Entrance Stairs-Number of Steps 3   [place] One level   [place] - 2 wheels;[place] - single  point     Prior Function   Level of Independence Independent   Vocation Retired     Standardized Balance Assessment   Standardized Balance Assessment Pensions consultant Test   Sit to Stand Able to stand  independently using hands   Standing Unsupported Able to stand safely 2 minutes   Sitting with Back Unsupported but Feet Supported on Floor or Stool Able to sit safely and securely 2 minutes   Stand to Sit Controls descent by using hands   Transfers Able to transfer safely, definite need of hands   Standing Unsupported with Eyes Closed Able to stand 10 seconds with supervision   Standing Ubsupported with Feet Together Able to place feet together independently and stand 1 minute safely   From Standing, Reach Forward with Outstretched Arm Can reach forward >5 cm safely (2")   From Standing Position, Pick up Object from  Floor Able to pick up shoe, needs supervision   From Standing Position, Turn to Look Behind Over each Shoulder Turn sideways only but maintains balance   Turn 360 Degrees Able to turn 360 degrees safely but slowly   Standing Unsupported, Alternately Place Feet on Step/Stool Able to stand independently and complete 8 steps >20 seconds   Standing Unsupported, One Foot in Front Able to take small step independently and hold 30 seconds   Standing on One Leg Able to lift leg independently and hold 5-10 seconds   Total Score 41       POSTURE/OBSERVATION: Rounded shoulders, forward head  PROM/AROM: Decreased R UE shoulder flexion, all others WFL  STRENGTH:  Graded on a 0-5 scale Muscle Group Left Right  Hip Flex 5/5 3+/5  Hip Abd 5/5 4-/5  Hip Add 5/5 4/5  Hip Ext    Hip IR/ER    Knee Flex 5/5 4-/5  Knee Ext 5/5 3+/5  Ankle DF 5/5 4/5  Ankle PF     SENSATION: B UE and LE intact to light touch   SPECIAL TESTS: B LE proprioception intact Heel to shin intact B LE  FUNCTIONAL MOBILITY: Difficulty walking and navigating steps  BALANCE: Fair standing balance, Good seated balance: 41/56 Berg Balance test  GAIT: Decreased R TKE, reduced trunk rotation and arm swing on R side with SPC or without AD, decreased speed  OUTCOME MEASURES: TEST Outcome Interpretation  5 times sit<>stand 21.36 sec >60 yo, >15 sec indicates increased risk for falls  10 meter walk test             0.69    m/s <1.0 m/s indicates increased risk for falls; limited community ambulator  Timed up and Go    17.21             sec <14 sec indicates increased risk for falls  Berg Balance Assessment 41/56 <36/56 (100% risk for falls), 37-45 (80% risk for falls); 46-51 (>50% risk for falls); 52-55 (lower risk <25% of falls)     Therex:  STS with out UE support x10 Seated:  LAQ x10 R LE,   Hip flexion x10 R LE  Hip abd/ER x10 with RTB  Provided HEP handout of theses exercises, see pt  instructions.                   PT Education - 10/31/15 1531    Education provided Yes   Education Details HEP, see pt instructions   Person(s) Educated Patient   Methods Explanation;Demonstration;Handout   Comprehension  Verbalized understanding;Returned demonstration             PT Long Term Goals - 10/31/15 1544      PT LONG TERM GOAL #1   Title Pt will score <15 seconds on 5x STS for reduced risk of falls within 8 weeks.   Baseline 21.36 sec   Time 8   Period Weeks   Status New     PT LONG TERM GOAL #2   Title Pt will score >1.0 m/s on 10 meter walk for community ambulation within 8 weeks with LRAD.   Baseline 0.69 m/s without AD   Time 8   Period Weeks   Status New     PT LONG TERM GOAL #3   Title Pt will score <10 sec on TUG for community ambulation within 8 weeks.   Baseline 17.21 sec   Time 8   Period Weeks     PT LONG TERM GOAL #4   Title Pt will score >45/56 on Berg Balance test for reduced risk of falls within 8 weeks.   Baseline 41/56   Time 8   Period Weeks   Status New               Plan - 10/31/15 1539    Clinical Impression Statement 75 yo F presented after a CVA in March with R sided deficits. She had decreased R sided LE strength, balance and gait deficits. She scored 41/56 on Berg Balance test and reduced walking speeds indicating increased risk for falling. Pt previously was indepenent in community ambulation, but is limited after her stroke. Pt will benefit from skilled PT to address deficits to increase funcitonal mobility and progress towards PLOF.   Rehab Potential Good   Clinical Impairments Affecting Rehab Potential comorbidities   PT Frequency 2x / week   PT Duration 8 weeks   PT Treatment/Interventions Gait training;Stair training;Therapeutic activities;Therapeutic exercise;Balance training;Neuromuscular re-education;Patient/family education   PT Next Visit Plan LE strengthening and balance training   PT Home  Exercise Plan STS, LAQs, march, hip abd seated with RTB   Consulted and Agree with Plan of Care Patient      Patient will benefit from skilled therapeutic intervention in order to improve the following deficits and impairments:  Decreased balance, Decreased coordination, Decreased endurance, Decreased strength, Difficulty walking  Visit Diagnosis: Muscle weakness (generalized) - Plan: PT plan of care cert/re-cert  Difficulty in walking, not elsewhere classified - Plan: PT plan of care cert/re-cert  Unsteadiness on feet - Plan: PT plan of care cert/re-cert      G-Codes - [id number]    Functional Assessment Tool Used berg, 10 m walk, TUG, 5x STS   Functional Limitation Mobility: Walking and moving around   Mobility: Walking and Moving Around Current Status [id number]) At least 20 percent but less than 40 percent impaired, limited or restricted   Mobility: Walking and Moving Around Goal Status [id number]) At least 1 percent but less than 20 percent impaired, limited or restricted       Problem List Patient Active Problem List   Diagnosis Date Noted  . Bleeding from varicose veins of lower extremity 10/24/2015  . Cerebrovascular accident (CVA) due to thrombosis of left middle cerebral artery ([place]) 09/11/2015  . Chronic anticoagulation 09/11/2015  . HLD (hyperlipidemia) 09/11/2015  . Muscle spasticity   . Hemiparesis affecting dominant side as late effect of stroke ([place])   . Gait disturbance, post-stroke   . Dyslipidemia   . Chronic systolic congestive heart  failure ([place])   . Counseling regarding end of life decision making 02/02/2015  . Atrophic vaginitis 12/23/2014  . Pelvic pressure in female 12/23/2014  . Urethral caruncle 12/23/2014  . Cystocele, midline 12/23/2014  . Bradycardia 12/12/2014  . Junctional rhythm 12/09/2014  . Chronic systolic heart failure ([place]) 10/31/2014  . A-fib ([place]) 10/14/2014  . Irritable bowel syndrome (IBS) 04/09/2011  . POSTMENOPAUSAL STATUS  02/02/2009  . HYPERLIPIDEMIA 12/26/2006  . HYPERTENSION, BENIGN ESSENTIAL 12/26/2006  . ARTHRITIS, RHEUMATOID 12/26/2006    Neoma Laming, PT, DPT  10/31/15, 4:02 PM [place] MAIN [facility] SERVICES [address] [place], [place], [address] Phone: [phone]   Fax:  [phone]  Name: ALIYANNAH CORRIE MRN: [id number] Date of Birth: 10-15-1940

## 2015-10-31 NOTE — Therapy (Signed)
[place] MAIN [facility] SERVICES [address] [place], [place], [address] Phone: [phone]   Fax:  [phone]  Occupational Therapy Treatment  Patient Details  Name: Karen Bowman MRN: [id number] Date of Birth: 05-02-1940 Referring Provider: Naaman Bowman  Encounter Date: 10/31/2015      OT End of Session - 10/31/15 1547    Visit Number 5   Number of Visits 24   Date for OT Re-Evaluation 12/20/15   Authorization Type 5 Medicare G code of 10   OT Start Time 1330   Activity Tolerance Patient tolerated treatment well   Behavior During Therapy [facility] for tasks assessed/performed      Past Medical History:  Diagnosis Date  . Acute heart failure ([place])   . Anxiety   . Arrhythmia   . Arthritis   . Atrial fibrillation ([place])   . Heart murmur   . Hyperlipemia   . Hypertension   . Stroke [facility])     Past Surgical History:  Procedure Laterality Date  . ABDOMINAL HYSTERECTOMY    . APPENDECTOMY    . BLADDER SUSPENSION    . ELECTROPHYSIOLOGIC STUDY N/A 10/18/2014   Procedure: CARDIOVERSION;  Surgeon: Thayer Headings, MD;  Location: [facility] ORS;  Service: Cardiovascular;  Laterality: N/A;  . TEE WITHOUT CARDIOVERSION N/A 10/18/2014   Procedure: TRANSESOPHAGEAL ECHOCARDIOGRAM (TEE);  Surgeon: Thayer Headings, MD;  Location: [facility] ORS;  Service: Cardiovascular;  Laterality: N/A;  . TONSILLECTOMY    . TUMOR REMOVAL     right arm    There were no vitals filed for this visit.      Subjective Assessment - 10/31/15 1536    Subjective  Pt. reports she is not going to stop living. Pt. reports she is doing more around her home.   Patient is accompained by: Family member   Pertinent History Pt. is a 75 y.o. female who was admitted to [facility] on 06/28/2015 with a CVA and right sided hemiplegia. Pt. has a history of HTN, chronic systolic congestive heart failure, and A fib. Pt. had inpatient rehab services, as well as [place] services. Pt. now presents for  outpatient rehab services.   Limitations Dominant RUE strength and coordination.      OT TREATMENT    Neuro muscular re-education;  Pt. Worked on [facility] skills grasping 1/4"pegs with the long nose tweezers. Pt. Worked on handling tweezers to place the pegs, and worked on removing them with alternating thumb opposition to the 2nd through 5th digits. Pt worked with grasping mini clips.Pt. Removed them with thumb opposition.  Therapeutic Exercise:  Pt. performed 2# dowel ex. For UE strengthening secondary to weakness. Bilateral shoulder flexion, chest press, circular patterns, and elbow flexion/extension were performed. 3# dumbbell ex. for elbow flexion and extension,  2# for forearm supination/pronation, wrist flexion/extension. Pt. requires rest breaks and verbal cues for proper technique.                         OT Education - 10/31/15 1546    Education provided Yes   Person(s) Educated Patient   Methods Explanation;Demonstration   Comprehension Verbalized understanding;Returned demonstration             OT Long Term Goals - 09/27/15 2003      OT LONG TERM GOAL #1   Title Pt. will increase AROM in shoulder flexion by 10 degrees to be able to reach into kitchen cabinetry.   Baseline Shoulder flexion: [id number])  Time 12   Period Weeks   Status New     OT LONG TERM GOAL #2   Title Pt. will increase right grip strength by 10# to be able to open jars.   Baseline 13#, Pt. has difficulty oopening jars.   Time 12   Period Weeks   Status New     OT LONG TERM GOAL #3   Title Pt. will improve right lateral pinch strength by 3# to be able to cut meat and veggies efficiently in preparation for a meal.   Baseline 9#, Pt. requires increased time to complete with difficulty.   Time 12   Period Weeks   Status New     OT LONG TERM GOAL #4   Title Pt. improve right [place] by 5 sec. of speed on the 9-hole peg test to be able to fasten a bra.   Baseline 38 sec. Pt. has  difficulty fastening a bra.   Time 12   Period Weeks   Status New     OT LONG TERM GOAL #5   Title Pt. will be able to write 3 sentences efficiently with 100% legibility.   Baseline 50% legibilty   Time 12   Period Weeks   Status New               Plan - 10/31/15 1547    Clinical Impression Statement Pt. continues to present with RUE and shoulder weakness which impairs functional reaching during ADL and IADL tasks. Pt. continues to work on improving [facility] skills necessary for grasping, manipulating  small objects during ADLs, and writing.   Rehab Potential Good   Clinical Impairments Affecting Rehab Potential Positive Indicators: age, family support, and motivation. Negative Indicators: multiple comorbidities.   OT Frequency 2x / week   OT Treatment/Interventions Self-care/ADL training;Therapeutic exercise;Therapeutic exercises;Patient/family education;Therapeutic activities;DME and/or AE instruction;Passive range of motion;Functional Mobility Training;Moist Heat;Ultrasound;Manual Therapy;Nurse, adult and Agree with Plan of Care Patient      Patient will benefit from skilled therapeutic intervention in order to improve the following deficits and impairments:  Pain, Decreased activity tolerance, Decreased balance, Impaired UE functional use, Impaired tone, Decreased coordination, Decreased strength, Decreased range of motion, Decreased knowledge of use of DME  Visit Diagnosis: Muscle weakness (generalized)  Other lack of coordination    Problem List Patient Active Problem List   Diagnosis Date Noted  . Bleeding from varicose veins of lower extremity 10/24/2015  . Cerebrovascular accident (CVA) due to thrombosis of left middle cerebral artery ([place]) 09/11/2015  . Chronic anticoagulation 09/11/2015  . HLD (hyperlipidemia) 09/11/2015  . Muscle spasticity   . Hemiparesis affecting dominant side as late effect of stroke ([place])   . Gait disturbance,  post-stroke   . Dyslipidemia   . Chronic systolic congestive heart failure ([place])   . Counseling regarding end of life decision making 02/02/2015  . Atrophic vaginitis 12/23/2014  . Pelvic pressure in female 12/23/2014  . Urethral caruncle 12/23/2014  . Cystocele, midline 12/23/2014  . Bradycardia 12/12/2014  . Junctional rhythm 12/09/2014  . Chronic systolic heart failure ([place]) 10/31/2014  . A-fib ([place]) 10/14/2014  . Irritable bowel syndrome (IBS) 04/09/2011  . POSTMENOPAUSAL STATUS 02/02/2009  . HYPERLIPIDEMIA 12/26/2006  . HYPERTENSION, BENIGN ESSENTIAL 12/26/2006  . ARTHRITIS, RHEUMATOID 12/26/2006   Harrel Carina, MS, OTR/L   Harrel Carina 10/31/2015, 5:34 PM  [place] MAIN [facility] SERVICES [address] [place], [place], [address] Phone: [phone]   Fax:  [phone]  Name: REAM ALLEYNE MRN: [id number] Date of Birth: Feb 09, 1941

## 2015-11-02 ENCOUNTER — Encounter: Payer: Self-pay | Admitting: Occupational Therapy

## 2015-11-02 ENCOUNTER — Ambulatory Visit: Payer: Self-pay | Admitting: Physical Therapy

## 2015-11-07 ENCOUNTER — Ambulatory Visit: Payer: Medicare PPO | Admitting: Physical Therapy

## 2015-11-07 ENCOUNTER — Ambulatory Visit: Payer: Medicare PPO | Admitting: Occupational Therapy

## 2015-11-07 ENCOUNTER — Encounter: Payer: Self-pay | Admitting: Physical Therapy

## 2015-11-07 DIAGNOSIS — R2681 Unsteadiness on feet: Secondary | ICD-10-CM

## 2015-11-07 DIAGNOSIS — R262 Difficulty in walking, not elsewhere classified: Secondary | ICD-10-CM | POA: Diagnosis not present

## 2015-11-07 DIAGNOSIS — M6281 Muscle weakness (generalized): Secondary | ICD-10-CM

## 2015-11-07 DIAGNOSIS — R29810 Facial weakness: Secondary | ICD-10-CM | POA: Diagnosis not present

## 2015-11-07 DIAGNOSIS — R278 Other lack of coordination: Secondary | ICD-10-CM

## 2015-11-07 NOTE — Therapy (Signed)
[place] MAIN [facility] SERVICES [address] [place], [place], [address] Phone: [phone]   Fax:  [phone]  Physical Therapy Treatment  Patient Details  Name: Karen Bowman MRN: [id number] Date of Birth: [birth year] No Data Recorded  Encounter Date: 11/07/2015      PT End of Session - 11/07/15 1451    Visit Number 2   Authorization Type g codes   Authorization Time Period 2/10   PT Start Time 1442   PT Stop Time 1515   PT Time Calculation (min) 33 min   Behavior During Therapy [facility] for tasks assessed/performed      Past Medical History:  Diagnosis Date  . Acute heart failure ([place])   . Anxiety   . Arrhythmia   . Arthritis   . Atrial fibrillation ([place])   . Heart murmur   . Hyperlipemia   . Hypertension   . Stroke [facility])     Past Surgical History:  Procedure Laterality Date  . ABDOMINAL HYSTERECTOMY    . APPENDECTOMY    . BLADDER SUSPENSION    . ELECTROPHYSIOLOGIC STUDY N/A 10/18/2014   Procedure: CARDIOVERSION;  Surgeon: Thayer Headings, MD;  Location: [facility] ORS;  Service: Cardiovascular;  Laterality: N/A;  . TEE WITHOUT CARDIOVERSION N/A 10/18/2014   Procedure: TRANSESOPHAGEAL ECHOCARDIOGRAM (TEE);  Surgeon: Thayer Headings, MD;  Location: [facility] ORS;  Service: Cardiovascular;  Laterality: N/A;  . TONSILLECTOMY    . TUMOR REMOVAL     right arm    There were no vitals filed for this visit.      Subjective Assessment - 11/07/15 1449    Subjective Pt presents with a recent stroke on 06/27/15. She reports having remaining weakness, imbalance and some occasional pain in R LE. She does not want to try the nu-step because she thinks it might hurt her back.    Pertinent History CVA on 06/27/15, went to inpatient rehab, now coming to outpatient OT and PT, she now mainly ambulates wiht SPC, a lot of difficulty with stairs   Limitations Standing;Walking;House hold activities   How long can you stand comfortably? 1.5 hours   How long  can you walk comfortably? limited community distances with [facility]   Patient Stated Goals to strengthen legs, walk better, go up and down stairs        THER-EX TM beginning at . 4 and increasing to . 7 miles / hour x 9 minutes Quantum double leg press 60 lbs x 20 x 2 Heel raises with leg press 2 x 10; Mini squats  2 x 10; RTB side stepping in // bars 4 lengths x 2; Sit to stand with BUE and with 1 hand 2 x 10  CGA and  mod verbal cues used throughout with increased in postural sway and LOB most seen with narrow base of support and while on uneven surfaces. Continues to have balance deficits typical with diagnosis. Patient performs intermediate level exercises without pain behaviors.                         PT Education - 11/07/15 1450    Education provided Yes   Education Details HEP   Person(s) Educated Patient   Methods Explanation;Demonstration   Comprehension Verbalized understanding;Returned demonstration             PT Long Term Goals - 10/31/15 1544      PT LONG TERM GOAL #1   Title Pt will score <  15 seconds on 5x STS for reduced risk of falls within 8 weeks.   Baseline 21.36 sec   Time 8   Period Weeks   Status New     PT LONG TERM GOAL #2   Title Pt will score >1.0 m/s on 10 meter walk for community ambulation within 8 weeks with LRAD.   Baseline 0.69 m/s without AD   Time 8   Period Weeks   Status New     PT LONG TERM GOAL #3   Title Pt will score <10 sec on TUG for community ambulation within 8 weeks.   Baseline 17.21 sec   Time 8   Period Weeks     PT LONG TERM GOAL #4   Title Pt will score >45/56 on Berg Balance test for reduced risk of falls within 8 weeks.   Baseline 41/56   Time 8   Period Weeks   Status New               Plan - 11/07/15 1454    Clinical Impression Statement Patient was late to her appointment today. She is worried that her back pain will be aggravated with the nu-step machine. She is able to perform  gait trainng and beginning strengthening exercises with CGA and cuing for posture and safety.    Rehab Potential Good   Clinical Impairments Affecting Rehab Potential comorbidities   PT Frequency 2x / week   PT Duration 8 weeks   PT Treatment/Interventions Gait training;Stair training;Therapeutic activities;Therapeutic exercise;Balance training;Neuromuscular re-education;Patient/family education   PT Next Visit Plan LE strengthening and balance training   PT Home Exercise Plan STS, LAQs, march, hip abd seated with RTB   Consulted and Agree with Plan of Care Patient      Patient will benefit from skilled therapeutic intervention in order to improve the following deficits and impairments:  Decreased balance, Decreased coordination, Decreased endurance, Decreased strength, Difficulty walking  Visit Diagnosis: Muscle weakness (generalized)  Other lack of coordination  Difficulty in walking, not elsewhere classified  Unsteadiness on feet     Problem List Patient Active Problem List   Diagnosis Date Noted  . Bleeding from varicose veins of lower extremity 10/24/2015  . Cerebrovascular accident (CVA) due to thrombosis of left middle cerebral artery ([place]) 09/11/2015  . Chronic anticoagulation 09/11/2015  . HLD (hyperlipidemia) 09/11/2015  . Muscle spasticity   . Hemiparesis affecting dominant side as late effect of stroke ([place])   . Gait disturbance, post-stroke   . Dyslipidemia   . Chronic systolic congestive heart failure ([place])   . Counseling regarding end of life decision making 02/02/2015  . Atrophic vaginitis 12/23/2014  . Pelvic pressure in female 12/23/2014  . Urethral caruncle 12/23/2014  . Cystocele, midline 12/23/2014  . Bradycardia 12/12/2014  . Junctional rhythm 12/09/2014  . Chronic systolic heart failure ([place]) 10/31/2014  . A-fib ([place]) 10/14/2014  . Irritable bowel syndrome (IBS) 04/09/2011  . POSTMENOPAUSAL STATUS 02/02/2009  . HYPERLIPIDEMIA 12/26/2006  .  HYPERTENSION, BENIGN ESSENTIAL 12/26/2006  . ARTHRITIS, RHEUMATOID 12/26/2006  Alanson Puls, PT, DPT  [place], Minette Headland S 11/07/2015, 2:58 PM  [place] MAIN [facility] SERVICES [address] [place], [place], [address] Phone: [phone]   Fax:  [phone]  Name: SHERELLE FARNEY MRN: [id number] Date of Birth: [birth year]

## 2015-11-07 NOTE — Therapy (Signed)
[place] MAIN [facility] SERVICES [address] [place], [place], [address] Phone: [phone]   Fax:  [phone]  Occupational Therapy Treatment  Patient Details  Name: Karen Bowman MRN: [id number] Date of Birth: November 06, 1940 Referring Provider: Naaman Plummer  Encounter Date: 11/07/2015      OT End of Session - 11/07/15 1524    Visit Number 6   Number of Visits 24   Date for OT Re-Evaluation 12/20/15   Authorization Type 6 Medicare G code of 10   OT Start Time 1520   OT Stop Time 1600   OT Time Calculation (min) 40 min   Activity Tolerance Patient tolerated treatment well   Behavior During Therapy [facility] for tasks assessed/performed      Past Medical History:  Diagnosis Date  . Acute heart failure ([place])   . Anxiety   . Arrhythmia   . Arthritis   . Atrial fibrillation ([place])   . Heart murmur   . Hyperlipemia   . Hypertension   . Stroke [facility])     Past Surgical History:  Procedure Laterality Date  . ABDOMINAL HYSTERECTOMY    . APPENDECTOMY    . BLADDER SUSPENSION    . ELECTROPHYSIOLOGIC STUDY N/A 10/18/2014   Procedure: CARDIOVERSION;  Surgeon: Thayer Headings, MD;  Location: [facility] ORS;  Service: Cardiovascular;  Laterality: N/A;  . TEE WITHOUT CARDIOVERSION N/A 10/18/2014   Procedure: TRANSESOPHAGEAL ECHOCARDIOGRAM (TEE);  Surgeon: Thayer Headings, MD;  Location: [facility] ORS;  Service: Cardiovascular;  Laterality: N/A;  . TONSILLECTOMY    . TUMOR REMOVAL     right arm    There were no vitals filed for this visit.      Subjective Assessment - 11/07/15 1522    Subjective  Pt. reports she is getting stronger daily, and is now walking from the parking lot into the stores.   Patient is accompained by: Family member   Pertinent History Pt. is a 75 y.o. female who was admitted to [facility] on 06/28/2015 with a CVA and right sided hemiplegia. Pt. has a history of HTN, chronic systolic congestive heart failure, and A fib. Pt. had inpatient rehab  services, as well as [place] services. Pt. now presents for outpatient rehab services.   Limitations Dominant RUE strength and coordination.   Currently in Pain? No/denies      OT TREATMENT    Neuro muscular re-education:  Pt. Worked on thumb opposition to the 2nd through 5th digits grasping 1/2" rings on a dowel simulating a skewer. Pt. performed [facility] tasks using the Grooved pegboard. Pt. worked on grasping the grooved pegs from a horizontal position, and moving the pegs to a vertical position in the hand to prepare for placing them in the grooved slot. Pt. Worked on storing the pegs in her hand while grasping, and placing with her 2nd digit and thumb. Pt. Worked on grasping from various positions of the pegs.  Therapeutic Exercise:  Pt. performed gross gripping with grip strengthener. Pt. worked on sustaining grip while grasping pegs and reaching at various heights. Gripper was placed in the 2nd resistive slot with the white resistive spring. Pt. Worked on pinch strengthening in the left hand for lateral, and 3pt. pinch using yellow, red, green, and blue resistive clips. Pt. worked on placing the clips at various vertical and horizontal angles. Tactile and verbal cues were required for eliciting the desired movement.  OT Education - 11/07/15 1557    Education provided Yes   Person(s) Educated Patient   Methods Explanation;Demonstration   Comprehension Verbalized understanding;Returned demonstration             OT Long Term Goals - 09/27/15 2003      OT LONG TERM GOAL #1   Title Pt. will increase AROM in shoulder flexion by 10 degrees to be able to reach into kitchen cabinetry.   Baseline Shoulder flexion: [id number])   Time 12   Period Weeks   Status New     OT LONG TERM GOAL #2   Title Pt. will increase right grip strength by 10# to be able to open jars.   Baseline 13#, Pt. has difficulty oopening jars.   Time 12   Period Weeks    Status New     OT LONG TERM GOAL #3   Title Pt. will improve right lateral pinch strength by 3# to be able to cut meat and veggies efficiently in preparation for a meal.   Baseline 9#, Pt. requires increased time to complete with difficulty.   Time 12   Period Weeks   Status New     OT LONG TERM GOAL #4   Title Pt. improve right [place] by 5 sec. of speed on the 9-hole peg test to be able to fasten a bra.   Baseline 38 sec. Pt. has difficulty fastening a bra.   Time 12   Period Weeks   Status New     OT LONG TERM GOAL #5   Title Pt. will be able to write 3 sentences efficiently with 100% legibility.   Baseline 50% legibilty   Time 12   Period Weeks   Status New               Plan - 11/07/15 1524    Clinical Impression Statement Pt. is now using her right hand to cook more, and is able to cut the corn from the cob, and cut the ends off the cobb. Pt. does not have the strength to cut the cob in half. Pt. continues twork on improving RUE strength, and  coordination skills needed to complete ADL , and IADL tasks.   Rehab Potential Good   Clinical Impairments Affecting Rehab Potential Positive Indicators: age, family support, and motivation. Negative Indicators: multiple comorbidities.   OT Frequency 2x / week   OT Duration 12 weeks   OT Treatment/Interventions Self-care/ADL training;Therapeutic exercise;Therapeutic exercises;Patient/family education;Therapeutic activities;DME and/or AE instruction;Passive range of motion;Functional Mobility Training;Moist Heat;Ultrasound;Manual Therapy;Nurse, adult and Agree with Plan of Care Patient      Patient will benefit from skilled therapeutic intervention in order to improve the following deficits and impairments:  Pain, Decreased activity tolerance, Decreased balance, Impaired UE functional use, Impaired tone, Decreased coordination, Decreased strength, Decreased range of motion, Decreased knowledge of use of  DME  Visit Diagnosis: No diagnosis found.    Problem List Patient Active Problem List   Diagnosis Date Noted  . Bleeding from varicose veins of lower extremity 10/24/2015  . Cerebrovascular accident (CVA) due to thrombosis of left middle cerebral artery ([place]) 09/11/2015  . Chronic anticoagulation 09/11/2015  . HLD (hyperlipidemia) 09/11/2015  . Muscle spasticity   . Hemiparesis affecting dominant side as late effect of stroke ([place])   . Gait disturbance, post-stroke   . Dyslipidemia   . Chronic systolic congestive heart failure ([place])   . Counseling regarding end of life decision making 02/02/2015  .  Atrophic vaginitis 12/23/2014  . Pelvic pressure in female 12/23/2014  . Urethral caruncle 12/23/2014  . Cystocele, midline 12/23/2014  . Bradycardia 12/12/2014  . Junctional rhythm 12/09/2014  . Chronic systolic heart failure ([place]) 10/31/2014  . A-fib ([place]) 10/14/2014  . Irritable bowel syndrome (IBS) 04/09/2011  . POSTMENOPAUSAL STATUS 02/02/2009  . HYPERLIPIDEMIA 12/26/2006  . HYPERTENSION, BENIGN ESSENTIAL 12/26/2006  . ARTHRITIS, RHEUMATOID 12/26/2006    Harrel Carina, MS, OTR/L 11/07/2015, 6:16 PM  [place] MAIN [facility] SERVICES [address] [place], [place], [address] Phone: [phone]   Fax:  [phone]  Name: Karen Bowman MRN: [id number] Date of Birth: Oct 25, 1940

## 2015-11-07 NOTE — Patient Instructions (Signed)
OT TREATMENT    Neuro muscular re-education:  Pt. Worked on thumb opposition to the 2nd through 5th digits grasping 1/2" rings on a dowel simulating a skewer. Pt. performed [employer] tasks using the Grooved pegboard. Pt. worked on grasping the grooved pegs from a horizontal position, and moving the pegs to a vertical position in the hand to prepare for placing them in the grooved slot. Pt. Worked on storing the pegs in her hand while grasping, and placing with her 2nd digit and thumb. Pt. Worked on grasping from various positions of the pegs.  Therapeutic Exercise:  Pt. performed gross gripping with grip strengthener. Pt. worked on sustaining grip while grasping pegs and reaching at various heights. Gripper was placed in the 2nd resistive slot with the white resistive spring. Pt. Worked on pinch strengthening in the left hand for lateral, and 3pt. pinch using yellow, red, green, and blue resistive clips. Pt. worked on placing the clips at various vertical and horizontal angles. Tactile and verbal cues were required for eliciting the desired movement.

## 2015-11-09 ENCOUNTER — Ambulatory Visit: Payer: Medicare PPO | Admitting: Occupational Therapy

## 2015-11-09 ENCOUNTER — Ambulatory Visit: Payer: Medicare PPO | Admitting: Physical Therapy

## 2015-11-09 ENCOUNTER — Encounter: Payer: Self-pay | Admitting: Physical Therapy

## 2015-11-09 DIAGNOSIS — R278 Other lack of coordination: Secondary | ICD-10-CM

## 2015-11-09 DIAGNOSIS — M6281 Muscle weakness (generalized): Secondary | ICD-10-CM | POA: Diagnosis not present

## 2015-11-09 DIAGNOSIS — R2681 Unsteadiness on feet: Secondary | ICD-10-CM

## 2015-11-09 DIAGNOSIS — R29810 Facial weakness: Secondary | ICD-10-CM

## 2015-11-09 DIAGNOSIS — R262 Difficulty in walking, not elsewhere classified: Secondary | ICD-10-CM

## 2015-11-09 NOTE — Therapy (Signed)
[place] MAIN [facility] SERVICES [address] [place], [place], [address] Phone: [phone]   Fax:  [phone]  Occupational Therapy Treatment  Patient Details  Name: Karen Bowman MRN: [id number] Date of Birth: 02-20-1941 Referring Provider: Naaman Plummer  Encounter Date: 11/09/2015      OT End of Session - 11/09/15 1709    Visit Number 7   Number of Visits 24   Date for OT Re-Evaluation 12/20/15   Authorization Type 7 Medicare G code of 10   OT Start Time [id number]   OT Stop Time 1615   OT Time Calculation (min) 45 min   Activity Tolerance Patient tolerated treatment well   Behavior During Therapy [facility] for tasks assessed/performed      Past Medical History:  Diagnosis Date  . Acute heart failure ([place])   . Anxiety   . Arrhythmia   . Arthritis   . Atrial fibrillation ([place])   . Heart murmur   . Hyperlipemia   . Hypertension   . Stroke [facility])     Past Surgical History:  Procedure Laterality Date  . ABDOMINAL HYSTERECTOMY    . APPENDECTOMY    . BLADDER SUSPENSION    . ELECTROPHYSIOLOGIC STUDY N/A 10/18/2014   Procedure: CARDIOVERSION;  Surgeon: Thayer Headings, MD;  Location: [facility] ORS;  Service: Cardiovascular;  Laterality: N/A;  . TEE WITHOUT CARDIOVERSION N/A 10/18/2014   Procedure: TRANSESOPHAGEAL ECHOCARDIOGRAM (TEE);  Surgeon: Thayer Headings, MD;  Location: [facility] ORS;  Service: Cardiovascular;  Laterality: N/A;  . TONSILLECTOMY    . TUMOR REMOVAL     right arm    There were no vitals filed for this visit.      Subjective Assessment - 11/09/15 1536    Subjective  pt. reports havinhad a bad night with restless leg syndrome.   Patient is accompained by: Family member   Pertinent History Pt. is a 75 y.o. female who was admitted to [facility] on 06/28/2015 with a CVA and right sided hemiplegia. Pt. has a history of HTN, chronic systolic congestive heart failure, and A fib. Pt. had inpatient rehab services, as well as [place] services.  Pt. now presents for outpatient rehab services.   Limitations Dominant RUE strength and coordination.   Pain Score 6    Pain Location Leg   Pain Orientation Right   Pain Descriptors / Indicators Aching   Pain Onset More than a month ago   Aggravating Factors  Being up on her feet cooking, ironing   Pain Relieving Factors Laying down straight        OT TREATMENT    Neuro muscular re-education:  Pt. performed [facility] tasks using the Grooved pegboard. Pt. worked on grasping the grooved pegs from a horizontal position, and moving the pegs to a vertical position in the hand to prepare for placing them in the grooved slot. Pt. Worked on removing the pegs while storing them in her hand. Pt. Worked on removing pegs with thumb opposition to the tip of her 2nd through 5th digits.   Therapeutic Exercise:  Pt. performed gross gripping with grip strengthener. Pt. worked on sustaining grip while grasping pegs and reaching at various heights. Gripper was placed in the 2nd and 3rd resistive slot with the white resistive spring. Pt. Worked on pinch strengthening in the left hand for lateral, and 3pt. pinch using yellow, red, and green, resistive clips. Pt. worked on placing the clips at various vertical and horizontal angles. Tactile and verbal cues were  required for eliciting the desired movement.  Selfcare:  Pt. Worked on Media planner, and speed. Pt. Completed one 10 word sentence in 1 min. & 48 sec. With 75% legibility. Pt presented with a mature pen grasp throughout writing tasks. Pt. Presented with minimal deviation from the line.                          OT Education - 11/09/15 1708    Education provided Yes   Person(s) Educated Patient   Methods Explanation   Comprehension Verbalized understanding             OT Long Term Goals - 09/27/15 2003      OT LONG TERM GOAL #1   Title Pt. will increase AROM in shoulder flexion by 10 degrees to be able to reach into  kitchen cabinetry.   Baseline Shoulder flexion: [id number])   Time 12   Period Weeks   Status New     OT LONG TERM GOAL #2   Title Pt. will increase right grip strength by 10# to be able to open jars.   Baseline 13#, Pt. has difficulty oopening jars.   Time 12   Period Weeks   Status New     OT LONG TERM GOAL #3   Title Pt. will improve right lateral pinch strength by 3# to be able to cut meat and veggies efficiently in preparation for a meal.   Baseline 9#, Pt. requires increased time to complete with difficulty.   Time 12   Period Weeks   Status New     OT LONG TERM GOAL #4   Title Pt. improve right [place] by 5 sec. of speed on the 9-hole peg test to be able to fasten a bra.   Baseline 38 sec. Pt. has difficulty fastening a bra.   Time 12   Period Weeks   Status New     OT LONG TERM GOAL #5   Title Pt. will be able to write 3 sentences efficiently with 100% legibility.   Baseline 50% legibilty   Time 12   Period Weeks   Status New               Plan - 11/09/15 1709    Clinical Impression Statement Pt. continues to present with limitations in RUE strength, and coordination skills which hinder her ability to use it during ADL and IADL tasks efficiently. Pt. continues grip, pinch strength, and translatory movements of the hand, and well as speed and dexterity for writing efficiently and legibly.   Rehab Potential Good   Clinical Impairments Affecting Rehab Potential Positive Indicators: age, family support, and motivation. Negative Indicators: multiple comorbidities.   OT Frequency 2x / week   OT Duration 12 weeks   OT Treatment/Interventions Self-care/ADL training;Therapeutic exercise;Therapeutic exercises;Patient/family education;Therapeutic activities;DME and/or AE instruction;Passive range of motion;Functional Mobility Training;Moist Heat;Ultrasound;Manual Therapy;Nurse, adult and Agree with Plan of Care Patient      Patient will benefit from  skilled therapeutic intervention in order to improve the following deficits and impairments:  Pain, Decreased activity tolerance, Decreased balance, Impaired UE functional use, Impaired tone, Decreased coordination, Decreased strength, Decreased range of motion, Decreased knowledge of use of DME  Visit Diagnosis: Facial weakness  Other lack of coordination    Problem List Patient Active Problem List   Diagnosis Date Noted  . Bleeding from varicose veins of lower extremity 10/24/2015  . Cerebrovascular accident (CVA) due to thrombosis  of left middle cerebral artery ([place]) 09/11/2015  . Chronic anticoagulation 09/11/2015  . HLD (hyperlipidemia) 09/11/2015  . Muscle spasticity   . Hemiparesis affecting dominant side as late effect of stroke ([place])   . Gait disturbance, post-stroke   . Dyslipidemia   . Chronic systolic congestive heart failure ([place])   . Counseling regarding end of life decision making 02/02/2015  . Atrophic vaginitis 12/23/2014  . Pelvic pressure in female 12/23/2014  . Urethral caruncle 12/23/2014  . Cystocele, midline 12/23/2014  . Bradycardia 12/12/2014  . Junctional rhythm 12/09/2014  . Chronic systolic heart failure ([place]) 10/31/2014  . A-fib ([place]) 10/14/2014  . Irritable bowel syndrome (IBS) 04/09/2011  . POSTMENOPAUSAL STATUS 02/02/2009  . HYPERLIPIDEMIA 12/26/2006  . HYPERTENSION, BENIGN ESSENTIAL 12/26/2006  . ARTHRITIS, RHEUMATOID 12/26/2006    Harrel Carina, MS, OTR/L 11/09/2015, 5:15 PM  [place] MAIN [facility] SERVICES [address] [place], [place], [address] Phone: [phone]   Fax:  [phone]  Name: Karen Bowman MRN: [id number] Date of Birth: 12-12-1940

## 2015-11-09 NOTE — Patient Instructions (Addendum)
OT TREATMENT    Neuro muscular re-education:  Pt. performed [facility] tasks using the Grooved pegboard. Pt. worked on grasping the grooved pegs from a horizontal position, and moving the pegs to a vertical position in the hand to prepare for placing them in the grooved slot. Pt. Worked on removing the pegs while storing them in her hand. Pt. Worked on removing pegs with thumb opposition to the tip of her 2nd through 5th digits.   Therapeutic Exercise:  Pt. performed gross gripping with grip strengthener. Pt. worked on sustaining grip while grasping pegs and reaching at various heights. Gripper was placed in the 2nd and 3rd resistive slot with the white resistive spring. Pt. Worked on pinch strengthening in the left hand for lateral, and 3pt. pinch using yellow, red, and green, resistive clips. Pt. worked on placing the clips at various vertical and horizontal angles. Tactile and verbal cues were required for eliciting the desired movement.  Selfcare:  Pt. Worked on Media planner, and speed. Pt. Completed one 10 word sentence in 1 min. & 48 sec. With 75% legibility. Pt presented with a mature pen grasp throughout writing tasks. Pt. Presented with minimal deviation from the line.

## 2015-11-09 NOTE — Therapy (Signed)
[place] MAIN [facility] SERVICES [address] [place], [place], [address] Phone: [phone]   Fax:  [phone]  Physical Therapy Treatment  Patient Details  Name: Karen Bowman MRN: [id number] Date of Birth: 1940-05-20 No Data Recorded  Encounter Date: 11/09/2015      PT End of Session - 11/09/15 1456    Visit Number 3   Authorization Type g codes   Authorization Time Period 3/10   PT Start Time 1450   PT Stop Time 1515   PT Time Calculation (min) 25 min   Equipment Utilized During Treatment Gait belt   Activity Tolerance Patient tolerated treatment well;Patient limited by fatigue;Patient limited by pain   Behavior During Therapy WFL for tasks assessed/performed      Past Medical History:  Diagnosis Date  . Acute heart failure ([place])   . Anxiety   . Arrhythmia   . Arthritis   . Atrial fibrillation ([place])   . Heart murmur   . Hyperlipemia   . Hypertension   . Stroke [facility])     Past Surgical History:  Procedure Laterality Date  . ABDOMINAL HYSTERECTOMY    . APPENDECTOMY    . BLADDER SUSPENSION    . ELECTROPHYSIOLOGIC STUDY N/A 10/18/2014   Procedure: CARDIOVERSION;  Surgeon: Thayer Headings, MD;  Location: [facility] ORS;  Service: Cardiovascular;  Laterality: N/A;  . TEE WITHOUT CARDIOVERSION N/A 10/18/2014   Procedure: TRANSESOPHAGEAL ECHOCARDIOGRAM (TEE);  Surgeon: Thayer Headings, MD;  Location: [facility] ORS;  Service: Cardiovascular;  Laterality: N/A;  . TONSILLECTOMY    . TUMOR REMOVAL     right arm    There were no vitals filed for this visit.      Subjective Assessment - 11/09/15 1454    Subjective Patient presents with 6/10 RLE pain. she did not sleep well last night and she is having leg spasms at night.    Pertinent History CVA on 06/27/15, went to inpatient rehab, now coming to outpatient OT and PT, she now mainly ambulates wiht SPC, a lot of difficulty with stairs   Limitations Standing;Walking;House hold activities   How long can you stand comfortably? 1.5 hours   How long can you walk comfortably? limited community distances with [facility]   Patient Stated Goals to strengthen legs, walk better, go up and down stairs   Currently in Pain? Yes   Pain Score 6    Pain Location Leg   Pain Orientation Right   Pain Descriptors / Indicators Discomfort;Aching   Pain Type Chronic pain   Pain Onset More than a month ago   Pain Frequency Intermittent   Aggravating Factors  being up on her feet.   Pain Relieving Factors creams   Effect of Pain on Daily Activities unable to perform house hold tasks   Multiple Pain Sites No    Therapeutic exericse:  standing hip abd/ flex/ ext with YTB x 20  Eccentric step downs from 6 inch stool x 10 BLE side stepping left and right in parallel bars 10 feet x 3 standing on blue foam with cone reaching x 20 across midline step ups from floor to 6 inch stool x 20 bilateral sit to stand with one UE x 10 Min cueing needed to appropriately perform  Exercise tasks with leg, hand, and head position.  Patient continues to demonstrate some in coordination of movement with select exercises such as  stepping backwards. Patient responds well to verbal and tactile cues to correct form and technique.  SBA for safety with activities.                             PT Education - 11/09/15 1456    Education provided Yes   Education Details HEP   Person(s) Educated Patient   Methods Explanation   Comprehension Verbalized understanding             PT Long Term Goals - 10/31/15 1544      PT LONG TERM GOAL #1   Title Pt will score <15 seconds on 5x STS for reduced risk of falls within 8 weeks.   Baseline 21.36 sec   Time 8   Period Weeks   Status New     PT LONG TERM GOAL #2   Title Pt will score >1.0 m/s on 10 meter walk for community ambulation within 8 weeks with LRAD.   Baseline 0.69 m/s without AD   Time 8   Period Weeks   Status New     PT LONG TERM GOAL  #3   Title Pt will score <10 sec on TUG for community ambulation within 8 weeks.   Baseline 17.21 sec   Time 8   Period Weeks     PT LONG TERM GOAL #4   Title Pt will score >45/56 on Berg Balance test for reduced risk of falls within 8 weeks.   Baseline 41/56   Time 8   Period Weeks   Status New               Plan - 11/09/15 1457    Clinical Impression Statement Patient is late to todays appointment. She is having RLE pain today 6/10. She is able to perform LE exercises to help improve her mobility.    Rehab Potential Good   Clinical Impairments Affecting Rehab Potential comorbidities   PT Frequency 2x / week   PT Duration 8 weeks   PT Treatment/Interventions Gait training;Stair training;Therapeutic activities;Therapeutic exercise;Balance training;Neuromuscular re-education;Patient/family education   PT Next Visit Plan LE strengthening and balance training   PT Home Exercise Plan STS, LAQs, march, hip abd seated with RTB   Consulted and Agree with Plan of Care Patient      Patient will benefit from skilled therapeutic intervention in order to improve the following deficits and impairments:  Decreased balance, Decreased coordination, Decreased endurance, Decreased strength, Difficulty walking  Visit Diagnosis: Muscle weakness (generalized)  Other lack of coordination  Difficulty in walking, not elsewhere classified  Unsteadiness on feet     Problem List Patient Active Problem List   Diagnosis Date Noted  . Bleeding from varicose veins of lower extremity 10/24/2015  . Cerebrovascular accident (CVA) due to thrombosis of left middle cerebral artery ([place]) 09/11/2015  . Chronic anticoagulation 09/11/2015  . HLD (hyperlipidemia) 09/11/2015  . Muscle spasticity   . Hemiparesis affecting dominant side as late effect of stroke ([place])   . Gait disturbance, post-stroke   . Dyslipidemia   . Chronic systolic congestive heart failure ([place])   . Counseling regarding end of  life decision making 02/02/2015  . Atrophic vaginitis 12/23/2014  . Pelvic pressure in female 12/23/2014  . Urethral caruncle 12/23/2014  . Cystocele, midline 12/23/2014  . Bradycardia 12/12/2014  . Junctional rhythm 12/09/2014  . Chronic systolic heart failure ([place]) 10/31/2014  . A-fib ([place]) 10/14/2014  . Irritable bowel syndrome (IBS) 04/09/2011  . POSTMENOPAUSAL STATUS 02/02/2009  . HYPERLIPIDEMIA 12/26/2006  . HYPERTENSION, BENIGN ESSENTIAL 12/26/2006  .  ARTHRITIS, RHEUMATOID 12/26/2006   Alanson Puls, PT, DPT [place], Minette Headland S 11/09/2015, 2:59 PM  [place] MAIN [facility] SERVICES [address] [place], [place], [address] Phone: [phone]   Fax:  [phone]  Name: Karen Bowman MRN: [id number] Date of Birth: 1940-05-07

## 2015-11-14 ENCOUNTER — Ambulatory Visit: Payer: Medicare PPO | Admitting: Physical Therapy

## 2015-11-14 ENCOUNTER — Encounter: Payer: Self-pay | Admitting: Physical Therapy

## 2015-11-14 ENCOUNTER — Ambulatory Visit: Payer: Medicare PPO | Admitting: Occupational Therapy

## 2015-11-14 DIAGNOSIS — R2681 Unsteadiness on feet: Secondary | ICD-10-CM | POA: Diagnosis not present

## 2015-11-14 DIAGNOSIS — M6281 Muscle weakness (generalized): Secondary | ICD-10-CM

## 2015-11-14 DIAGNOSIS — R29810 Facial weakness: Secondary | ICD-10-CM | POA: Diagnosis not present

## 2015-11-14 DIAGNOSIS — R262 Difficulty in walking, not elsewhere classified: Secondary | ICD-10-CM

## 2015-11-14 DIAGNOSIS — R278 Other lack of coordination: Secondary | ICD-10-CM

## 2015-11-14 NOTE — Therapy (Signed)
[place] MAIN [facility] SERVICES [address] [place], [place], [address] Phone: [phone]   Fax:  [phone]  Physical Therapy Treatment  Patient Details  Name: Karen Bowman MRN: [id number] Date of Birth: 01-29-1941 No Data Recorded  Encounter Date: 11/14/2015      PT End of Session - 11/14/15 1544    Visit Number 4   Authorization Type g codes   Authorization Time Period 4/10   PT Start Time 1448   PT Stop Time 1530   PT Time Calculation (min) 42 min   Equipment Utilized During Treatment Gait belt   Activity Tolerance Patient tolerated treatment well   Behavior During Therapy WFL for tasks assessed/performed      Past Medical History:  Diagnosis Date  . Acute heart failure ([place])   . Anxiety   . Arrhythmia   . Arthritis   . Atrial fibrillation ([place])   . Heart murmur   . Hyperlipemia   . Hypertension   . Stroke [facility])     Past Surgical History:  Procedure Laterality Date  . ABDOMINAL HYSTERECTOMY    . APPENDECTOMY    . BLADDER SUSPENSION    . ELECTROPHYSIOLOGIC STUDY N/A 10/18/2014   Procedure: CARDIOVERSION;  Surgeon: Thayer Headings, MD;  Location: [facility] ORS;  Service: Cardiovascular;  Laterality: N/A;  . TEE WITHOUT CARDIOVERSION N/A 10/18/2014   Procedure: TRANSESOPHAGEAL ECHOCARDIOGRAM (TEE);  Surgeon: Thayer Headings, MD;  Location: [facility] ORS;  Service: Cardiovascular;  Laterality: N/A;  . TONSILLECTOMY    . TUMOR REMOVAL     right arm    There were no vitals filed for this visit.      Subjective Assessment - 11/14/15 1453    Subjective Patient states that she feels like PT has been helping her legs feel stronger and less pain. She states she had 2 falls a month ago but denies recent.    Pertinent History CVA on 06/27/15, went to inpatient rehab, now coming to outpatient OT and PT, she now mainly ambulates wiht SPC, a lot of difficulty with stairs   Limitations Standing;Walking;House hold activities   How long  can you stand comfortably? 1.5 hours   How long can you walk comfortably? limited community distances with [facility]   Patient Stated Goals to strengthen legs, walk better, go up and down stairs   Currently in Pain? No/denies   Pain Onset More than a month ago      Treatment:  NuStep warm up x2 min, L2 for 30 seconds decreased to L0 due to mild R knee pain. (unbilled)  TherEx: Standing hip abduction and extension, BLE 2x10, with yellow tband resistance, min VCs to maintain upright posture, maintain feet facing forward to target glut med, and to squeeze glut max during extension.  Lateral forward/backward walking over wooden plank, x1 each direction, minA to maintain balance and provide VCs to increase step distance.   Sit to stands, x2 with BUE support, x2 with 1 HHA and no HHA, with airex pad to increase chair height, x5 reps with 4.4# weight performing BUE flexion overhead, CGA for safety, min VCs to decrease UE support.   Pball on wall, x2, increased R knee pain, discontinued due to pain.   Resisted walking, lateral (x1 each LE leading) and backwards (x2), min-modA during lateral walking to regain balance and minA during backwards walking. Min VCs to move her body in the opposite direction of where the weight was pulling and to control eccentric movement.  Neuromuscular Re-ed: Static stance on half bolster (flat side up), x30 seconds, feet apart, CGA for safety Toe taps on BOSU ball, 2x10, min VCs to decrease UE support, CGA for safety Step up/downs on BOSU ball, 2x10, 1 HHA, min VCs to move to no UE support and stand for 2-3 seconds to challenge balance.  Static tandem stance, x30 seconds each LE leading, CGA for safety, demonstrates good balance                              PT Education - 11/14/15 1526    Education provided Yes   Education Details safety, hip muscular supporting knees   Person(s) Educated Patient   Methods Explanation   Comprehension  Verbalized understanding             PT Long Term Goals - 10/31/15 1544      PT LONG TERM GOAL #1   Title Pt will score <15 seconds on 5x STS for reduced risk of falls within 8 weeks.   Baseline 21.36 sec   Time 8   Period Weeks   Status New     PT LONG TERM GOAL #2   Title Pt will score >1.0 m/s on 10 meter walk for community ambulation within 8 weeks with LRAD.   Baseline 0.69 m/s without AD   Time 8   Period Weeks   Status New     PT LONG TERM GOAL #3   Title Pt will score <10 sec on TUG for community ambulation within 8 weeks.   Baseline 17.21 sec   Time 8   Period Weeks     PT LONG TERM GOAL #4   Title Pt will score >45/56 on Berg Balance test for reduced risk of falls within 8 weeks.   Baseline 41/56   Time 8   Period Weeks   Status New               Plan - 11/14/15 1544    Clinical Impression Statement Patient notes her pain is improved today. She complains of mild knee pain with mild popping in RLE during pball wall squats so discontinued. Patient has mild knee pain with sit to stands, but she states it is not very bad. Patient requires minA-modA to maintain stability during resisted lateral walking and minA with backwards walking. Patient would benefit from continued skilled PT in order to address BLE strength, decrease knee pain, and to improve functional mobility.   Rehab Potential Good   Clinical Impairments Affecting Rehab Potential comorbidities   PT Frequency 2x / week   PT Duration 8 weeks   PT Treatment/Interventions Gait training;Stair training;Therapeutic activities;Therapeutic exercise;Balance training;Neuromuscular re-education;Patient/family education   PT Next Visit Plan LE strengthening and balance training   PT Home Exercise Plan STS, LAQs, march, hip abd seated with RTB   Consulted and Agree with Plan of Care Patient      Patient will benefit from skilled therapeutic intervention in order to improve the following deficits and  impairments:  Decreased balance, Decreased coordination, Decreased endurance, Decreased strength, Difficulty walking  Visit Diagnosis: Muscle weakness (generalized)  Difficulty in walking, not elsewhere classified  Unsteadiness on feet  Other lack of coordination     Problem List Patient Active Problem List   Diagnosis Date Noted  . Bleeding from varicose veins of lower extremity 10/24/2015  . Cerebrovascular accident (CVA) due to thrombosis of left middle cerebral artery ([place]) 09/11/2015  .  Chronic anticoagulation 09/11/2015  . HLD (hyperlipidemia) 09/11/2015  . Muscle spasticity   . Hemiparesis affecting dominant side as late effect of stroke ([place])   . Gait disturbance, post-stroke   . Dyslipidemia   . Chronic systolic congestive heart failure ([place])   . Counseling regarding end of life decision making 02/02/2015  . Atrophic vaginitis 12/23/2014  . Pelvic pressure in female 12/23/2014  . Urethral caruncle 12/23/2014  . Cystocele, midline 12/23/2014  . Bradycardia 12/12/2014  . Junctional rhythm 12/09/2014  . Chronic systolic heart failure ([place]) 10/31/2014  . A-fib ([place]) 10/14/2014  . Irritable bowel syndrome (IBS) 04/09/2011  . POSTMENOPAUSAL STATUS 02/02/2009  . HYPERLIPIDEMIA 12/26/2006  . HYPERTENSION, BENIGN ESSENTIAL 12/26/2006  . ARTHRITIS, RHEUMATOID 12/26/2006   Tilman Neat, SPT This entire session was performed under direct supervision and direction of a licensed therapist/therapist assistant . I have personally read, edited and approve of the note as written.  Trotter,Margaret  PT, DPT 11/14/2015, 4:17 PM  [place] MAIN [facility] SERVICES [address] [place], [place], [address] Phone: [phone]   Fax:  [phone]  Name: DARNETTE KUSZ MRN: [id number] Date of Birth: 1940-06-07

## 2015-11-14 NOTE — Patient Instructions (Signed)
OT TREATMENT    Neuro muscular re-education:  Pt. worked on grasping 1/4 "objects with long nosed tweezers using her right hand. Pt. Worked on removing pegs using thumb opposition to the tip of the 2nd through 5th digits. Pt. Worked on grasping mini clips, and removing them with thumb opposition to her 2nd through 5th digits.  Therapeutic Exercise:  Pt. Worked on pinch strengthening in the left hand for lateral, and 3pt. pinch using yellow, red, green, blue, and black resistive clips. Pt. worked on placing the clips at various vertical and horizontal angles. Tactile and verbal cues were required for eliciting the desired movement.

## 2015-11-14 NOTE — Therapy (Signed)
[place] MAIN [facility] SERVICES [address] [place], [place], [address] Phone: [phone]   Fax:  [phone]  Occupational Therapy Treatment  Patient Details  Name: Karen Bowman MRN: [id number] Date of Birth: Aug 07, 1940 Referring Provider: Naaman Plummer  Encounter Date: 11/14/2015      OT End of Session - 11/14/15 1729    Visit Number 8   Number of Visits 24   Date for OT Re-Evaluation 12/20/15   Authorization Type 8 Medicare G code of 10   OT Start Time [id number]   OT Stop Time 1445   OT Time Calculation (min) 30 min   Activity Tolerance Patient tolerated treatment well   Behavior During Therapy [facility] for tasks assessed/performed      Past Medical History:  Diagnosis Date  . Acute heart failure ([place])   . Anxiety   . Arrhythmia   . Arthritis   . Atrial fibrillation ([place])   . Heart murmur   . Hyperlipemia   . Hypertension   . Stroke [facility])     Past Surgical History:  Procedure Laterality Date  . ABDOMINAL HYSTERECTOMY    . APPENDECTOMY    . BLADDER SUSPENSION    . ELECTROPHYSIOLOGIC STUDY N/A 10/18/2014   Procedure: CARDIOVERSION;  Surgeon: Thayer Headings, MD;  Location: [facility] ORS;  Service: Cardiovascular;  Laterality: N/A;  . TEE WITHOUT CARDIOVERSION N/A 10/18/2014   Procedure: TRANSESOPHAGEAL ECHOCARDIOGRAM (TEE);  Surgeon: Thayer Headings, MD;  Location: [facility] ORS;  Service: Cardiovascular;  Laterality: N/A;  . TONSILLECTOMY    . TUMOR REMOVAL     right arm    There were no vitals filed for this visit.      Subjective Assessment - 11/14/15 1418    Subjective  Pt. reports feeling beat today from doing to much housework yesterday.   Patient is accompained by: Family member   Pertinent History Pt. is a 75 y.o. female who was admitted to [facility] on 06/28/2015 with a CVA and right sided hemiplegia. Pt. has a history of HTN, chronic systolic congestive heart failure, and A fib. Pt. had inpatient rehab services, as well as [place]  services. Pt. now presents for outpatient rehab services.   Limitations Dominant RUE strength and coordination.   Currently in Pain? No/denies   Pain Score 0-No pain      OT TREATMENT    Neuro muscular re-education:  Pt. worked on grasping 1/4 "objects with long nosed tweezers using her right hand. Pt. Worked on removing pegs using thumb opposition to the tip of the 2nd through 5th digits. Pt. Worked on grasping mini clips, and removing them with thumb opposition to her 2nd through 5th digits.  Therapeutic Exercise:  Pt. Worked on pinch strengthening in the left hand for lateral, and 3pt. pinch using yellow, red, green, blue, and black resistive clips. Pt. worked on placing the clips at various vertical and horizontal angles. Tactile and verbal cues were required for eliciting the desired movement.                                OT Long Term Goals - 09/27/15 2003      OT LONG TERM GOAL #1   Title Pt. will increase AROM in shoulder flexion by 10 degrees to be able to reach into kitchen cabinetry.   Baseline Shoulder flexion: [id number])   Time 12   Period Weeks   Status New  OT LONG TERM GOAL #2   Title Pt. will increase right grip strength by 10# to be able to open jars.   Baseline 13#, Pt. has difficulty oopening jars.   Time 12   Period Weeks   Status New     OT LONG TERM GOAL #3   Title Pt. will improve right lateral pinch strength by 3# to be able to cut meat and veggies efficiently in preparation for a meal.   Baseline 9#, Pt. requires increased time to complete with difficulty.   Time 12   Period Weeks   Status New     OT LONG TERM GOAL #4   Title Pt. improve right [place] by 5 sec. of speed on the 9-hole peg test to be able to fasten a bra.   Baseline 38 sec. Pt. has difficulty fastening a bra.   Time 12   Period Weeks   Status New     OT LONG TERM GOAL #5   Title Pt. will be able to write 3 sentences efficiently with 100% legibility.    Baseline 50% legibilty   Time 12   Period Weeks   Status New               Plan - 11/14/15 1730    Clinical Impression Statement Pt. continues to present with impaired Right UE strength and fine motor coordination skills needed for completing ADL and IADL tasks. Pt. continues to present with weakness and decreased activity tolerance for houshold related tasks. Pt. continues to benefit from skilled OT services to improve [facility] skills and UE strength to improve ADL and IADL tasks.   Rehab Potential Good   Clinical Impairments Affecting Rehab Potential Positive Indicators: age, family support, and motivation. Negative Indicators: multiple comorbidities.   OT Frequency 2x / week   OT Duration 12 weeks   OT Treatment/Interventions Self-care/ADL training;Therapeutic exercise;Therapeutic exercises;Patient/family education;Therapeutic activities;DME and/or AE instruction;Passive range of motion;Functional Mobility Training;Moist Heat;Ultrasound;Manual Therapy;Electrical Stimulation      Patient will benefit from skilled therapeutic intervention in order to improve the following deficits and impairments:  Pain, Decreased activity tolerance, Decreased balance, Impaired UE functional use, Impaired tone, Decreased coordination, Decreased strength, Decreased range of motion, Decreased knowledge of use of DME  Visit Diagnosis: Muscle weakness (generalized)  Other lack of coordination    Problem List Patient Active Problem List   Diagnosis Date Noted  . Bleeding from varicose veins of lower extremity 10/24/2015  . Cerebrovascular accident (CVA) due to thrombosis of left middle cerebral artery ([place]) 09/11/2015  . Chronic anticoagulation 09/11/2015  . HLD (hyperlipidemia) 09/11/2015  . Muscle spasticity   . Hemiparesis affecting dominant side as late effect of stroke ([place])   . Gait disturbance, post-stroke   . Dyslipidemia   . Chronic systolic congestive heart failure ([place])   . Counseling  regarding end of life decision making 02/02/2015  . Atrophic vaginitis 12/23/2014  . Pelvic pressure in female 12/23/2014  . Urethral caruncle 12/23/2014  . Cystocele, midline 12/23/2014  . Bradycardia 12/12/2014  . Junctional rhythm 12/09/2014  . Chronic systolic heart failure ([place]) 10/31/2014  . A-fib ([place]) 10/14/2014  . Irritable bowel syndrome (IBS) 04/09/2011  . POSTMENOPAUSAL STATUS 02/02/2009  . HYPERLIPIDEMIA 12/26/2006  . HYPERTENSION, BENIGN ESSENTIAL 12/26/2006  . ARTHRITIS, RHEUMATOID 12/26/2006    Harrel Carina, MS, OTR/L 11/14/2015, 5:38 PM  [place] MAIN [facility] SERVICES [address] [place], [place], [address] Phone: [phone]   Fax:  [phone]  Name:  Karen Bowman MRN: [id number] Date of Birth: 1940-08-17

## 2015-11-16 ENCOUNTER — Encounter: Payer: Self-pay | Admitting: Physical Therapy

## 2015-11-16 ENCOUNTER — Encounter: Payer: Self-pay | Admitting: Occupational Therapy

## 2015-11-16 ENCOUNTER — Ambulatory Visit: Payer: Medicare PPO | Admitting: Occupational Therapy

## 2015-11-16 ENCOUNTER — Ambulatory Visit: Payer: Medicare PPO | Admitting: Physical Therapy

## 2015-11-16 DIAGNOSIS — M6281 Muscle weakness (generalized): Secondary | ICD-10-CM

## 2015-11-16 DIAGNOSIS — R262 Difficulty in walking, not elsewhere classified: Secondary | ICD-10-CM | POA: Diagnosis not present

## 2015-11-16 DIAGNOSIS — R278 Other lack of coordination: Secondary | ICD-10-CM | POA: Diagnosis not present

## 2015-11-16 DIAGNOSIS — R29810 Facial weakness: Secondary | ICD-10-CM | POA: Diagnosis not present

## 2015-11-16 DIAGNOSIS — R2681 Unsteadiness on feet: Secondary | ICD-10-CM

## 2015-11-16 NOTE — Patient Instructions (Addendum)
OT TREATMENT    Neuro muscular re-education:  Pt. Worked on grasping coins from a tabletop surface, placing them into a resistive container, and pushing them through the slot while isolating his 2nd digit. A resistive mat was placed under coins to aide in manipulating the coins and prevent sliding when picking them up. Pt. Worked on opening various sizes, and types of bottle tops independently.with verbal cues to stabilize with her left while using her right hand to open.  Therapeutic Exercise:  Pt. performed gross gripping with grip strengthener. Pt. worked on sustaining grip while grasping pegs and reaching at various heights. Gripper was placed in the 3rd & 2nd resistive slot with the white resistive spring.

## 2015-11-16 NOTE — Therapy (Signed)
[place] MAIN [facility] SERVICES [address] [place], [place], [address] Phone: [phone]   Fax:  [phone]  Occupational Therapy Treatment  Patient Details  Name: Karen Bowman MRN: [id number] Date of Birth: 1941-01-25 Referring Provider: Naaman Bowman  Encounter Date: 11/16/2015      OT End of Session - 11/16/15 1715    Visit Number 9   Number of Visits 24   Date for OT Re-Evaluation 12/20/15   Authorization Type 9 Medicare G code of 10   OT Start Time 1410   OT Stop Time 1445   OT Time Calculation (min) 35 min   Activity Tolerance Patient tolerated treatment well   Behavior During Therapy [facility] for tasks assessed/performed      Past Medical History:  Diagnosis Date  . Acute heart failure ([place])   . Anxiety   . Arrhythmia   . Arthritis   . Atrial fibrillation ([place])   . Heart murmur   . Hyperlipemia   . Hypertension   . Stroke [facility])     Past Surgical History:  Procedure Laterality Date  . ABDOMINAL HYSTERECTOMY    . APPENDECTOMY    . BLADDER SUSPENSION    . ELECTROPHYSIOLOGIC STUDY N/A 10/18/2014   Procedure: CARDIOVERSION;  Surgeon: Karen Headings, MD;  Location: [facility] ORS;  Service: Cardiovascular;  Laterality: N/A;  . TEE WITHOUT CARDIOVERSION N/A 10/18/2014   Procedure: TRANSESOPHAGEAL ECHOCARDIOGRAM (TEE);  Surgeon: Karen Headings, MD;  Location: [facility] ORS;  Service: Cardiovascular;  Laterality: N/A;  . TONSILLECTOMY    . TUMOR REMOVAL     right arm    There were no vitals filed for this visit.      Subjective Assessment - 11/16/15 1404    Subjective  Pt. reports being being tired from having visitors.   Pertinent History Pt. is a 75 y.o. female who was admitted to [facility] on 06/28/2015 with a CVA and right sided hemiplegia. Pt. has a history of HTN, chronic systolic congestive heart failure, and A fib. Pt. had inpatient rehab services, as well as [place] services. Pt. now presents for outpatient rehab services.   Limitations Dominant RUE strength and coordination.   Currently in Pain? No/denies   Pain Score 0-No pain      OT TREATMENT    Neuro muscular re-education:  Pt. Worked on grasping coins from a tabletop surface, placing them into a resistive container, and pushing them through the slot while isolating his 2nd digit. A resistive mat was placed under coins to aide in manipulating the coins and prevent sliding when picking them up. Pt. Worked on opening various sizes, and types of bottle tops independently.with verbal cues to stabilize with her left while using her right hand to open.  Therapeutic Exercise:  Pt. performed gross gripping with grip strengthener. Pt. worked on sustaining grip while grasping pegs and reaching at various heights. Gripper was placed in the 3rd & 2nd resistive slot with the white resistive spring.                             OT Education - 11/16/15 1715    Education provided Yes   Person(s) Educated Patient   Methods Explanation;Demonstration   Comprehension Verbalized understanding;Returned demonstration             OT Long Term Goals - 09/27/15 2003      OT LONG TERM GOAL #1   Title Pt. will  increase AROM in shoulder flexion by 10 degrees to be able to reach into kitchen cabinetry.   Baseline Shoulder flexion: [id number])   Time 12   Period Weeks   Status New     OT LONG TERM GOAL #2   Title Pt. will increase right grip strength by 10# to be able to open jars.   Baseline 13#, Pt. has difficulty oopening jars.   Time 12   Period Weeks   Status New     OT LONG TERM GOAL #3   Title Pt. will improve right lateral pinch strength by 3# to be able to cut meat and veggies efficiently in preparation for a meal.   Baseline 9#, Pt. requires increased time to complete with difficulty.   Time 12   Period Weeks   Status New     OT LONG TERM GOAL #4   Title Pt. improve right [place] by 5 sec. of speed on the 9-hole peg test to be able to  fasten a bra.   Baseline 38 sec. Pt. has difficulty fastening a bra.   Time 12   Period Weeks   Status New     OT LONG TERM GOAL #5   Title Pt. will be able to write 3 sentences efficiently with 100% legibility.   Baseline 50% legibilty   Time 12   Period Weeks   Status New               Plan - 11/16/15 1716    Clinical Impression Statement Pt. conitnues to work on improving activity tolerance for household tasks. Pt. conitnues to fatigue when performing household management. Pt. continues to present with RUE weakness and incoordination, and continues to benefit from skilled OT services to improve strength, and functional hand use during ADL/IADL related tasks.   Rehab Potential Good   Clinical Impairments Affecting Rehab Potential Positive Indicators: age, family support, and motivation. Negative Indicators: multiple comorbidities.   OT Frequency 2x / week   OT Duration 12 weeks   OT Treatment/Interventions Self-care/ADL training;Therapeutic exercise;Therapeutic exercises;Patient/family education;Therapeutic activities;DME and/or AE instruction;Passive range of motion;Functional Mobility Training;Moist Heat;Ultrasound;Manual Therapy;Nurse, adult and Agree with Plan of Care Patient      Patient will benefit from skilled therapeutic intervention in order to improve the following deficits and impairments:  Pain, Decreased activity tolerance, Decreased balance, Impaired UE functional use, Impaired tone, Decreased coordination, Decreased strength, Decreased range of motion, Decreased knowledge of use of DME  Visit Diagnosis: Muscle weakness (generalized)  Other lack of coordination    Problem List Patient Active Problem List   Diagnosis Date Noted  . Bleeding from varicose veins of lower extremity 10/24/2015  . Cerebrovascular accident (CVA) due to thrombosis of left middle cerebral artery ([place]) 09/11/2015  . Chronic anticoagulation 09/11/2015  . HLD  (hyperlipidemia) 09/11/2015  . Muscle spasticity   . Hemiparesis affecting dominant side as late effect of stroke ([place])   . Gait disturbance, post-stroke   . Dyslipidemia   . Chronic systolic congestive heart failure ([place])   . Counseling regarding end of life decision making 02/02/2015  . Atrophic vaginitis 12/23/2014  . Pelvic pressure in female 12/23/2014  . Urethral caruncle 12/23/2014  . Cystocele, midline 12/23/2014  . Bradycardia 12/12/2014  . Junctional rhythm 12/09/2014  . Chronic systolic heart failure ([place]) 10/31/2014  . A-fib ([place]) 10/14/2014  . Irritable bowel syndrome (IBS) 04/09/2011  . POSTMENOPAUSAL STATUS 02/02/2009  . HYPERLIPIDEMIA 12/26/2006  . HYPERTENSION, BENIGN ESSENTIAL 12/26/2006  .  ARTHRITIS, RHEUMATOID 12/26/2006    Harrel Carina, MS, OTR/L 11/16/2015, 5:23 PM  [place] MAIN [facility] SERVICES [address] [place], [place], [address] Phone: [phone]   Fax:  [phone]  Name: Karen Bowman MRN: [id number] Date of Birth: December 18, 1940

## 2015-11-16 NOTE — Therapy (Signed)
[place] MAIN [facility] SERVICES [address] [place], [place], [address] Phone: [phone]   Fax:  [phone]  Physical Therapy Treatment  Patient Details  Name: Karen Bowman MRN: [id number] Date of Birth: 02/14/1941 No Data Recorded  Encounter Date: 11/16/2015      PT End of Session - 11/16/15 1528    Visit Number 5   Number of Visits 17   Date for PT Re-Evaluation 12-15-15   Authorization Type g codes   Authorization Time Period 5/10   PT Start Time 1432   PT Stop Time 1516   PT Time Calculation (min) 44 min   Equipment Utilized During Treatment Gait belt   Activity Tolerance Patient tolerated treatment well   Behavior During Therapy WFL for tasks assessed/performed      Past Medical History:  Diagnosis Date  . Acute heart failure ([place])   . Anxiety   . Arrhythmia   . Arthritis   . Atrial fibrillation ([place])   . Heart murmur   . Hyperlipemia   . Hypertension   . Stroke [facility])     Past Surgical History:  Procedure Laterality Date  . ABDOMINAL HYSTERECTOMY    . APPENDECTOMY    . BLADDER SUSPENSION    . ELECTROPHYSIOLOGIC STUDY N/A 10/18/2014   Procedure: CARDIOVERSION;  Surgeon: Thayer Headings, MD;  Location: [facility] ORS;  Service: Cardiovascular;  Laterality: N/A;  . TEE WITHOUT CARDIOVERSION N/A 10/18/2014   Procedure: TRANSESOPHAGEAL ECHOCARDIOGRAM (TEE);  Surgeon: Thayer Headings, MD;  Location: [facility] ORS;  Service: Cardiovascular;  Laterality: N/A;  . TONSILLECTOMY    . TUMOR REMOVAL     right arm    There were no vitals filed for this visit.      Subjective Assessment - 11/16/15 1434    Subjective Patient states she feels tired today due to having company over. Denies falls. She states she feels like she is sleeping a little bit better since therapy. Reports increased swelling in RLE due to being on her feet yesterday and increase R knee issues today.   Pertinent History CVA on 06/27/15, went to inpatient rehab,  now coming to outpatient OT and PT, she now mainly ambulates wiht SPC, a lot of difficulty with stairs   Limitations Standing;Walking;House hold activities   How long can you stand comfortably? 1.5 hours   How long can you walk comfortably? limited community distances with [facility]   Patient Stated Goals to strengthen legs, walk better, go up and down stairs   Currently in Pain? No/denies   Pain Onset More than a month ago      Treatment:  TherEx: Standing hip abduction, BLE 2x10, red tband resistance, min VCs to maintain feet facing forward to target glut med.  Resisted lateral walking, x1 each LE leading, 7.5#, minA to maintain balance x1, min shifts from midline, min VCs to lean away to control eccentric movement.  Lateral walking with red tband resistance, x4, min VCs to maintain feet forward to address glut med.  Forward and lateral step overs in // bars, no HHA, min VCs to get closer to the object before stepping.   Neuromuscular Re-ed (no HHA throughout): BOSU ball toe taps, 2x10 each LE, min VCs decrease UE support BOSU ball static standing, feet together, wide tandem stance, x20 seconds each, CGA-minA for balance, min VCs to maintain balance in midline. BOSU ball lateral step ups, LLE x 5, not performed on RLE due to pain, min VCs to control  gluts to support in single leg stance Ant/post and lateral static (x30 second each) and weight shifts (x10 each direction) on tilt board; cognitive task counting backwards from 2s with tapping and horizontal head rotations with static standing, CGA Heel toe walking firm surface, x4 passes, CGA for stability Forwards and lateral walking on airex balance beam, x4 passes each, minA for stability, Static tandem stance on airex pad, x30 seconds each LE leading, minA for stability and VCs to decrease UE support  Gait Training: Ambulation with cognitive task of counting backwards from 100 by 3s, no AD, CGA, patient's gait speed decreased significantly,  2x30 feet, min VCs to try to maintain gait speed. Ambulation with (30 feet) and without (2x30 feet) SPC, CGA, min VCs to relax arms and allow for natural swing pattern and to increase heel toe on BLE.                          PT Education - 11/16/15 1526    Education provided Yes   Education Details safety, dual tasks, elevating foot slightly, and ankle pumps   Person(s) Educated Patient   Methods Explanation;Demonstration   Comprehension Verbalized understanding;Returned demonstration             PT Long Term Goals - 10/31/15 1544      PT LONG TERM GOAL #1   Title Pt will score <15 seconds on 5x STS for reduced risk of falls within 8 weeks.   Baseline 21.36 sec   Time 8   Period Weeks   Status New     PT LONG TERM GOAL #2   Title Pt will score >1.0 m/s on 10 meter walk for community ambulation within 8 weeks with LRAD.   Baseline 0.69 m/s without AD   Time 8   Period Weeks   Status New     PT LONG TERM GOAL #3   Title Pt will score <10 sec on TUG for community ambulation within 8 weeks.   Baseline 17.21 sec   Time 8   Period Weeks     PT LONG TERM GOAL #4   Title Pt will score >45/56 on Berg Balance test for reduced risk of falls within 8 weeks.   Baseline 41/56   Time 8   Period Weeks   Status New               Plan - 11/16/15 1528    Clinical Impression Statement Although initially patient denies pain, with R knee flexion during activities patient reports increase R knee pain with audible grinding. Patient requires min VCs throughout session for appropriate form. Min VCs about gait training without AD, instructed to involve BUE with arm swing. Patient would continue to benefit from skilled PT in order to address BLE hip and knee weakness, balance, and functional mobility.    Rehab Potential Good   Clinical Impairments Affecting Rehab Potential comorbidities   PT Frequency 2x / week   PT Duration 8 weeks   PT Treatment/Interventions  Gait training;Stair training;Therapeutic activities;Therapeutic exercise;Balance training;Neuromuscular re-education;Patient/family education   PT Next Visit Plan LE strengthening and balance training   PT Home Exercise Plan STS, LAQs, march, hip abd seated with RTB   Consulted and Agree with Plan of Care Patient      Patient will benefit from skilled therapeutic intervention in order to improve the following deficits and impairments:  Decreased balance, Decreased coordination, Decreased endurance, Decreased strength, Difficulty walking  Visit  Diagnosis: Muscle weakness (generalized)  Difficulty in walking, not elsewhere classified  Unsteadiness on feet     Problem List Patient Active Problem List   Diagnosis Date Noted  . Bleeding from varicose veins of lower extremity 10/24/2015  . Cerebrovascular accident (CVA) due to thrombosis of left middle cerebral artery ([place]) 09/11/2015  . Chronic anticoagulation 09/11/2015  . HLD (hyperlipidemia) 09/11/2015  . Muscle spasticity   . Hemiparesis affecting dominant side as late effect of stroke ([place])   . Gait disturbance, post-stroke   . Dyslipidemia   . Chronic systolic congestive heart failure ([place])   . Counseling regarding end of life decision making 02/02/2015  . Atrophic vaginitis 12/23/2014  . Pelvic pressure in female 12/23/2014  . Urethral caruncle 12/23/2014  . Cystocele, midline 12/23/2014  . Bradycardia 12/12/2014  . Junctional rhythm 12/09/2014  . Chronic systolic heart failure ([place]) 10/31/2014  . A-fib ([place]) 10/14/2014  . Irritable bowel syndrome (IBS) 04/09/2011  . POSTMENOPAUSAL STATUS 02/02/2009  . HYPERLIPIDEMIA 12/26/2006  . HYPERTENSION, BENIGN ESSENTIAL 12/26/2006  . ARTHRITIS, RHEUMATOID 12/26/2006   Tilman Neat, SPT This entire session was performed under direct supervision and direction of a licensed therapist/therapist assistant . I have personally read, edited and approve of the note as  written.  Trotter,Margaret PT, DPT 11/16/2015, 3:58 PM  [place] MAIN [facility] SERVICES [address] [place], [place], [address] Phone: [phone]   Fax:  [phone]  Name: Karen Bowman MRN: [id number] Date of Birth: 1940/04/10

## 2015-11-21 ENCOUNTER — Ambulatory Visit: Payer: Medicare PPO | Admitting: Occupational Therapy

## 2015-11-21 ENCOUNTER — Ambulatory Visit: Payer: Medicare PPO | Admitting: Physical Therapy

## 2015-11-21 ENCOUNTER — Encounter: Payer: Self-pay | Admitting: Physical Therapy

## 2015-11-21 ENCOUNTER — Encounter: Payer: Self-pay | Admitting: Occupational Therapy

## 2015-11-21 DIAGNOSIS — R278 Other lack of coordination: Secondary | ICD-10-CM | POA: Diagnosis not present

## 2015-11-21 DIAGNOSIS — R29810 Facial weakness: Secondary | ICD-10-CM | POA: Diagnosis not present

## 2015-11-21 DIAGNOSIS — M6281 Muscle weakness (generalized): Secondary | ICD-10-CM

## 2015-11-21 DIAGNOSIS — R2681 Unsteadiness on feet: Secondary | ICD-10-CM | POA: Diagnosis not present

## 2015-11-21 DIAGNOSIS — R262 Difficulty in walking, not elsewhere classified: Secondary | ICD-10-CM

## 2015-11-21 NOTE — Therapy (Signed)
[place] MAIN [facility] SERVICES [address] [place], [place], [address] Phone: [phone]   Fax:  [phone]  Physical Therapy Treatment  Patient Details  Name: Karen Bowman MRN: [id number] Date of Birth: [birth year] No Data Recorded  Encounter Date: 11/21/2015      PT End of Session - 11/21/15 1609    Visit Number 6   Number of Visits 17   Date for PT Re-Evaluation 12-01-2015   Authorization Type g codes   Authorization Time Period 6/10   PT Start Time 1431   PT Stop Time 1515   PT Time Calculation (min) 44 min   Equipment Utilized During Treatment Gait belt   Activity Tolerance Patient tolerated treatment well   Behavior During Therapy WFL for tasks assessed/performed      Past Medical History:  Diagnosis Date  . Acute heart failure ([place])   . Anxiety   . Arrhythmia   . Arthritis   . Atrial fibrillation ([place])   . Heart murmur   . Hyperlipemia   . Hypertension   . Stroke [facility]     Past Surgical History:  Procedure Laterality Date  . ABDOMINAL HYSTERECTOMY    . APPENDECTOMY    . BLADDER SUSPENSION    . ELECTROPHYSIOLOGIC STUDY N/A 10/18/2014   Procedure: CARDIOVERSION;  Surgeon: Thayer Headings, MD;  Location: [facility] ORS;  Service: Cardiovascular;  Laterality: N/A;  . TEE WITHOUT CARDIOVERSION N/A 10/18/2014   Procedure: TRANSESOPHAGEAL ECHOCARDIOGRAM (TEE);  Surgeon: Thayer Headings, MD;  Location: [facility] ORS;  Service: Cardiovascular;  Laterality: N/A;  . TONSILLECTOMY    . TUMOR REMOVAL     right arm    There were no vitals filed for this visit.      Subjective Assessment - 11/21/15 1430    Subjective Pt reports that she feels good today and that her R knee is feeling better even though it changes frequently.     Pertinent History CVA on 06/27/15, went to inpatient rehab, now coming to outpatient OT and PT, she now mainly ambulates wiht SPC, a lot of difficulty with stairs   Limitations Standing;Walking;House hold  activities   How long can you stand comfortably? 1.5 hours   How long can you walk comfortably? limited community distances with [facility]   Patient Stated Goals to strengthen legs, walk better, go up and down stairs   Currently in Pain? No/denies   Pain Onset More than a month ago       Treatment Nustep x 4 mins BUEs/BLEs level 2 (unbilled) Leg Press, #75 BLEs, 2 sets x 10 reps, min VCs to prevent knee valgus and promote eccentric control  Sit to stands with yellow weighted ball x 10 reps, min VCs to increase glut activation and upright posture  Standing on blue airex pad  -Feet together x 15 self ball tosses, CGA for safety, min VCs for upright posture   -Tandem stance x 15 ball tosses leading with BLEs, min VCs for increased glut activation   -Hip flexion marches, 2 sets x 15 reps, CGA for safety, min VCs to decreased 1 HHA  Sidestepping on blue airex balance beam ball toss x 2 laps in both directions, CGA for safety, min VCs for larger step length  Standing hip flexion marches with red theraband restsiacne, 2 sets x 15 reps, 2 HHA, min VCs for increased hip flexion ROM   Sidestepping with red theraband resistance x 2 laps in both directions, pt picked up cones  at end of each lap without LOB or assistance                           PT Education - 11/21/15 1608    Education provided Yes   Education Details leg press technique, SPC   Person(s) Educated Patient   Methods Demonstration;Explanation;Verbal cues   Comprehension Verbalized understanding;Verbal cues required;Returned demonstration             PT Long Term Goals - 10/31/15 1544      PT LONG TERM GOAL #1   Title Pt will score <15 seconds on 5x STS for reduced risk of falls within 8 weeks.   Baseline 21.36 sec   Time 8   Period Weeks   Status New     PT LONG TERM GOAL #2   Title Pt will score >1.0 m/s on 10 meter walk for community ambulation within 8 weeks with LRAD.   Baseline 0.69 m/s without  AD   Time 8   Period Weeks   Status New     PT LONG TERM GOAL #3   Title Pt will score <10 sec on TUG for community ambulation within 8 weeks.   Baseline 17.21 sec   Time 8   Period Weeks     PT LONG TERM GOAL #4   Title Pt will score >45/56 on Berg Balance test for reduced risk of falls within 8 weeks.   Baseline 41/56   Time 8   Period Weeks   Status New               Plan - 11/21/15 1610    Clinical Impression Statement Pt reports no R knee pain today and was able to perform all ther ex and balance activites without aggravating R knee.  Pt required min VCs for eccentric control and preventing knee valgus with BLE leg press.  Pt was able to perform balance activites standing on blue airex pad with feet together and tandem without LOB while performing ball toss.  Pt demonstrated no difficulty picking cones off floor during sidestepping strengthening activity.  She would continue to benefit from further skilled physical therapy to increase her LE strength and dynamic balance to improve her functional mobility.     Rehab Potential Good   Clinical Impairments Affecting Rehab Potential comorbidities   PT Frequency 2x / week   PT Duration 8 weeks   PT Treatment/Interventions Gait training;Stair training;Therapeutic activities;Therapeutic exercise;Balance training;Neuromuscular re-education;Patient/family education   PT Next Visit Plan LE strengthening and balance training   PT Home Exercise Plan STS, LAQs, march, hip abd seated with RTB   Consulted and Agree with Plan of Care Patient      Patient will benefit from skilled therapeutic intervention in order to improve the following deficits and impairments:  Decreased balance, Decreased coordination, Decreased endurance, Decreased strength, Difficulty walking  Visit Diagnosis: Muscle weakness (generalized)  Difficulty in walking, not elsewhere classified  Unsteadiness on feet     Problem List Patient Active Problem List    Diagnosis Date Noted  . Bleeding from varicose veins of lower extremity 10/24/2015  . Cerebrovascular accident (CVA) due to thrombosis of left middle cerebral artery ([place]) 09/11/2015  . Chronic anticoagulation 09/11/2015  . HLD (hyperlipidemia) 09/11/2015  . Muscle spasticity   . Hemiparesis affecting dominant side as late effect of stroke ([place])   . Gait disturbance, post-stroke   . Dyslipidemia   . Chronic systolic congestive heart  failure ([place])   . Counseling regarding end of life decision making 02/02/2015  . Atrophic vaginitis 12/23/2014  . Pelvic pressure in female 12/23/2014  . Urethral caruncle 12/23/2014  . Cystocele, midline 12/23/2014  . Bradycardia 12/12/2014  . Junctional rhythm 12/09/2014  . Chronic systolic heart failure ([place]) 10/31/2014  . A-fib ([place]) 10/14/2014  . Irritable bowel syndrome (IBS) 04/09/2011  . POSTMENOPAUSAL STATUS 02/02/2009  . HYPERLIPIDEMIA 12/26/2006  . HYPERTENSION, BENIGN ESSENTIAL 12/26/2006  . ARTHRITIS, RHEUMATOID 12/26/2006   Stacy Gardner, SPT  This entire session was performed under direct supervision and direction of a licensed therapist/therapist assistant . I have personally read, edited and approve of the note as written.  Trotter,Margaret PT, DPT 11/22/2015, 9:02 AM  [place] MAIN [facility] SERVICES [address] [place], [place], [address] Phone: [phone]   Fax:  [phone]  Name: Karen Bowman MRN: [id number] Date of Birth: Aug 23, 1940

## 2015-11-21 NOTE — Patient Instructions (Addendum)
OT TREATMENT    Goals reviewed, and measurements were obtained for G-Code progress report.  Therapeutic Exercise:  Pt. Worked on UE strengthening with 2# dumbbell ex. for elbow flexion and extension,  2# for forearm supination/pronation, wrist flexion/extension, and radial deviation. Pt. requires rest breaks and verbal cues for proper technique.

## 2015-11-21 NOTE — Therapy (Signed)
[place] MAIN [facility] SERVICES [address] [place], [place], [address] Phone: [phone]   Fax:  [phone]  Occupational Therapy G-Code Progress Report  Patient Details  Name: Karen Bowman MRN: [id number] Date of Birth: 03-14-1941 Referring Provider: Naaman Plummer  Encounter Date: 11/21/2015      OT End of Session - 11/21/15 9211    Visit Number 10   Number of Visits 24   Date for OT Re-Evaluation 12/20/15   Authorization Type 10 Medicare G code of 10   OT Start Time 9417   Activity Tolerance Patient tolerated treatment well   Behavior During Therapy [facility] for tasks assessed/performed      Past Medical History:  Diagnosis Date  . Acute heart failure ([place])   . Anxiety   . Arrhythmia   . Arthritis   . Atrial fibrillation ([place])   . Heart murmur   . Hyperlipemia   . Hypertension   . Stroke [facility])     Past Surgical History:  Procedure Laterality Date  . ABDOMINAL HYSTERECTOMY    . APPENDECTOMY    . BLADDER SUSPENSION    . ELECTROPHYSIOLOGIC STUDY N/A 10/18/2014   Procedure: CARDIOVERSION;  Surgeon: Thayer Headings, MD;  Location: [facility] ORS;  Service: Cardiovascular;  Laterality: N/A;  . TEE WITHOUT CARDIOVERSION N/A 10/18/2014   Procedure: TRANSESOPHAGEAL ECHOCARDIOGRAM (TEE);  Surgeon: Thayer Headings, MD;  Location: [facility] ORS;  Service: Cardiovascular;  Laterality: N/A;  . TONSILLECTOMY    . TUMOR REMOVAL     right arm    There were no vitals filed for this visit.      Subjective Assessment - 11/21/15 1400    Subjective  pt. reports feeling worn out from having company the past few days.   Patient is accompained by: Family member   Pertinent History Pt. is a 75 y.o. female who was admitted to [facility] on 06/28/2015 with a CVA and right sided hemiplegia. Pt. has a history of HTN, chronic systolic congestive heart failure, and A fib. Pt. had inpatient rehab services, as well as [place] services. Pt. now presents for outpatient rehab  services.   Limitations Dominant RUE strength and coordination.            [facility] OT Assessment - 11/21/15 1411      Coordination   Right 9 Hole Peg Test 34     AROM   Overall AROM Comments Right shoulder flexion 128,  abduction 92     Strength   Overall Strength Comments Right shoulder flexion, abduction: 4-/5, elbow flexion/extension: 4+/5 wirst flexion, extension 4/5  Right shoulder flexion, abd: 4-/5, elbow flex/ext: 4+/5,     Hand Function   Right Hand Grip (lbs) 30#   Right Hand Lateral Pinch 9 lbs   Right Hand 3 Point Pinch 9 lbs      OT TREATMENT    Goals reviewed, and measurements were obtained for G-Code progress report.  Therapeutic Exercise:  Pt. Worked on UE strengthening with 2# dumbbell ex. for elbow flexion and extension,  2# for forearm supination/pronation, wrist flexion/extension, and radial deviation. Pt. requires rest breaks and verbal cues for proper technique.                        OT Education - 11/21/15 1740    Education provided Yes   Education Details Measurements, Pt. functional level.   Person(s) Educated Patient   Methods Explanation;Demonstration   Comprehension Verbalized understanding;Returned demonstration  OT Long Term Goals - Dec 21, 2015 1422      OT LONG TERM GOAL #1   Title Pt. will increase AROM in shoulder flexion by 10 degrees to be able to reach into kitchen cabinetry.   Baseline Shoulder flexion: 979(892)   Time 12   Period Weeks   Status Achieved     OT LONG TERM GOAL #2   Title Pt. will increase right grip strength by 10# to be able to open jars.   Baseline 13#, Pt. has difficulty oopening jars.   Time 12   Period Weeks   Status Achieved     OT LONG TERM GOAL #3   Title Pt. will improve right lateral pinch strength by 3# to be able to cut meat and veggies efficiently in preparation for a meal.   Baseline 9#, Pt. requires increased time to complete with difficulty.   Time 12    Period Weeks   Status Partially Met     OT LONG TERM GOAL #4   Title Pt. improve right [place] by 5 sec. of speed on the 9-hole peg test to be able to fasten a bra.   Baseline 38 sec. Pt. has difficulty fastening a bra.   Time 12   Period Weeks   Status Partially Met     OT LONG TERM GOAL #5   Title Pt. will be able to write 3 sentences efficiently with 100% legibility.   Baseline 50% legibilty   Time 12   Period Weeks   Status Partially Met     Long Term Additional Goals   Additional Long Term Goals --     OT LONG TERM GOAL #6   Title Pt. improve RUE strength to be able to independently lift, hold, and pour from a tea kettle.   Baseline Pt. is unable    Time 12   Period Weeks   Status New     OT LONG TERM GOAL #7   Title Pt. will independently be able to put small earrings with small plastic backings in standing up.   Baseline Pt. has difficulty in sitting.   Time 12   Period Weeks   Status New               Plan - 2015/12/21 1741    Clinical Impression Statement Pt. has made progress with her RUE strength, grip strength, pinch strength, and coordination skills. Pt. has improved with using her RUE during ADL and IADL tasks. Pt. continues to benefit from skilled OT services to continue working on improving RUE functioning during ADL/IADL tasks including: applying small earrings, holding a tea kettle, cutting veggies, and writing.   Rehab Potential Good   Clinical Impairments Affecting Rehab Potential Positive Indicators: age, family support, and motivation. Negative Indicators: multiple comorbidities.   OT Frequency 2x / week   OT Duration 12 weeks   OT Treatment/Interventions Self-care/ADL training;Therapeutic exercise;Therapeutic exercises;Patient/family education;Therapeutic activities;DME and/or AE instruction;Passive range of motion;Functional Mobility Training;Moist Heat;Ultrasound;Manual Therapy;Electrical Stimulation   Consulted and Agree with Plan of Care Patient       Patient will benefit from skilled therapeutic intervention in order to improve the following deficits and impairments:  Pain, Decreased activity tolerance, Decreased balance, Impaired UE functional use, Impaired tone, Decreased coordination, Decreased strength, Decreased range of motion, Decreased knowledge of use of DME  Visit Diagnosis: Muscle weakness (generalized)  Other lack of coordination      G-Codes - 12/21/2015 1738    Functional Assessment Tool Used Clinical judgement  based on pt. current functional status, 9 hole peg test, MMT, ROM, dynamometer, pinch gauge.   Functional Limitation Self care   Self Care Current Status [phone]) At least 20 percent but less than 40 percent impaired, limited or restricted   Self Care Goal Status ([id number]) At least 20 percent but less than 40 percent impaired, limited or restricted      Problem List Patient Active Problem List   Diagnosis Date Noted  . Bleeding from varicose veins of lower extremity 10/24/2015  . Cerebrovascular accident (CVA) due to thrombosis of left middle cerebral artery ([place]) 09/11/2015  . Chronic anticoagulation 09/11/2015  . HLD (hyperlipidemia) 09/11/2015  . Muscle spasticity   . Hemiparesis affecting dominant side as late effect of stroke ([place])   . Gait disturbance, post-stroke   . Dyslipidemia   . Chronic systolic congestive heart failure ([place])   . Counseling regarding end of life decision making 02/02/2015  . Atrophic vaginitis 12/23/2014  . Pelvic pressure in female 12/23/2014  . Urethral caruncle 12/23/2014  . Cystocele, midline 12/23/2014  . Bradycardia 12/12/2014  . Junctional rhythm 12/09/2014  . Chronic systolic heart failure ([place]) 10/31/2014  . A-fib ([place]) 10/14/2014  . Irritable bowel syndrome (IBS) 04/09/2011  . POSTMENOPAUSAL STATUS 02/02/2009  . HYPERLIPIDEMIA 12/26/2006  . HYPERTENSION, BENIGN ESSENTIAL 12/26/2006  . ARTHRITIS, RHEUMATOID 12/26/2006    Harrel Carina, MS.  OTR/L 11/21/2015, 5:49 PM  [place] [facility] SERVICES [address] [place], [place], [address] Phone: [phone]   Fax:  [phone]  Name: Karen Bowman MRN: [id number] Date of Birth: 1940-10-24

## 2015-11-23 ENCOUNTER — Ambulatory Visit: Payer: Medicare PPO | Admitting: Physical Therapy

## 2015-11-23 ENCOUNTER — Encounter: Payer: Self-pay | Admitting: Occupational Therapy

## 2015-11-23 ENCOUNTER — Encounter: Payer: Self-pay | Admitting: Physical Therapy

## 2015-11-23 ENCOUNTER — Ambulatory Visit: Payer: Medicare PPO | Admitting: Occupational Therapy

## 2015-11-23 DIAGNOSIS — M6281 Muscle weakness (generalized): Secondary | ICD-10-CM | POA: Diagnosis not present

## 2015-11-23 DIAGNOSIS — R2681 Unsteadiness on feet: Secondary | ICD-10-CM

## 2015-11-23 DIAGNOSIS — R262 Difficulty in walking, not elsewhere classified: Secondary | ICD-10-CM | POA: Diagnosis not present

## 2015-11-23 DIAGNOSIS — R278 Other lack of coordination: Secondary | ICD-10-CM | POA: Diagnosis not present

## 2015-11-23 DIAGNOSIS — R29810 Facial weakness: Secondary | ICD-10-CM | POA: Diagnosis not present

## 2015-11-23 NOTE — Therapy (Signed)
[place] MAIN [facility] SERVICES [address] [place], [place], [address] Phone: [phone]   Fax:  [phone]  Physical Therapy Treatment  Patient Details  Name: Karen Bowman MRN: [id number] Date of Birth: [birth year] No Data Recorded  Encounter Date: 11/23/2015      PT End of Session - 11/23/15 1412    Visit Number 7   Number of Visits 17   Date for PT Re-Evaluation 12-22-15   Authorization Type g codes   Authorization Time Period 7/10   PT Start Time 0230   PT Stop Time 0310   PT Time Calculation (min) 40 min   Equipment Utilized During Treatment Gait belt   Activity Tolerance Patient tolerated treatment well   Behavior During Therapy WFL for tasks assessed/performed      Past Medical History:  Diagnosis Date  . Acute heart failure ([place])   . Anxiety   . Arrhythmia   . Arthritis   . Atrial fibrillation ([place])   . Heart murmur   . Hyperlipemia   . Hypertension   . Stroke [facility])     Past Surgical History:  Procedure Laterality Date  . ABDOMINAL HYSTERECTOMY    . APPENDECTOMY    . BLADDER SUSPENSION    . ELECTROPHYSIOLOGIC STUDY N/A 10/18/2014   Procedure: CARDIOVERSION;  Surgeon: Thayer Headings, MD;  Location: [facility] ORS;  Service: Cardiovascular;  Laterality: N/A;  . TEE WITHOUT CARDIOVERSION N/A 10/18/2014   Procedure: TRANSESOPHAGEAL ECHOCARDIOGRAM (TEE);  Surgeon: Thayer Headings, MD;  Location: [facility] ORS;  Service: Cardiovascular;  Laterality: N/A;  . TONSILLECTOMY    . TUMOR REMOVAL     right arm    There were no vitals filed for this visit.      Subjective Assessment - 11/23/15 1456    Subjective Pt reports that she feels good today. She feels that her balance is unsteady.    Pertinent History CVA on 06/27/15, went to inpatient rehab, now coming to outpatient OT and PT, she now mainly ambulates wiht SPC, a lot of difficulty with stairs   Limitations Standing;Walking;House hold activities   How long can you stand  comfortably? 1.5 hours   How long can you walk comfortably? limited community distances with [facility]   Patient Stated Goals to strengthen legs, walk better, go up and down stairs   Currently in Pain? No/denies   Pain Score 0-No pain   Pain Onset More than a month ago   Multiple Pain Sites No     Therapeutic exercise and NMR:  1/2 foam tandem and side by side with head turns Blue foam tandem and horizontal ball side to side Leg press 75 lbs x 20 x 2, heel raises x 20 x 2 Rocker board fwd/bwd and side to side side stepping left and right in parallel bars on blue foam 10 feet x 3 Standing on blue foam and 4 lb cane exercise x 20 .   Cues for proper technique of exercises, slow eccentric contractions to target specific muscles and facility increased muscle building. Therapeutic rest breaks for energy conservation                             PT Education - 11/23/15 1412    Education provided Yes   Education Details heel toe gait   Person(s) Educated Patient   Methods Explanation   Comprehension Verbalized understanding  PT Long Term Goals - 10/31/15 1544      PT LONG TERM GOAL #1   Title Pt will score <15 seconds on 5x STS for reduced risk of falls within 8 weeks.   Baseline 21.36 sec   Time 8   Period Weeks   Status New     PT LONG TERM GOAL #2   Title Pt will score >1.0 m/s on 10 meter walk for community ambulation within 8 weeks with LRAD.   Baseline 0.69 m/s without AD   Time 8   Period Weeks   Status New     PT LONG TERM GOAL #3   Title Pt will score <10 sec on TUG for community ambulation within 8 weeks.   Baseline 17.21 sec   Time 8   Period Weeks     PT LONG TERM GOAL #4   Title Pt will score >45/56 on Berg Balance test for reduced risk of falls within 8 weeks.   Baseline 41/56   Time 8   Period Weeks   Status New               Plan - 11/23/15 1457    Clinical Impression Statement Patient performs advanced  dynamic standing balance activities and LE strengthening exercises with CGA using parallel bars. She is walking without an AD indoors and using a SPC outdoors.    Rehab Potential Good   Clinical Impairments Affecting Rehab Potential comorbidities   PT Frequency 2x / week   PT Duration 8 weeks   PT Treatment/Interventions Gait training;Stair training;Therapeutic activities;Therapeutic exercise;Balance training;Neuromuscular re-education;Patient/family education   PT Next Visit Plan LE strengthening and balance training   PT Home Exercise Plan STS, LAQs, march, hip abd seated with RTB   Consulted and Agree with Plan of Care Patient      Patient will benefit from skilled therapeutic intervention in order to improve the following deficits and impairments:  Decreased balance, Decreased coordination, Decreased endurance, Decreased strength, Difficulty walking  Visit Diagnosis: Muscle weakness (generalized)  Difficulty in walking, not elsewhere classified  Unsteadiness on feet     Problem List Patient Active Problem List   Diagnosis Date Noted  . Bleeding from varicose veins of lower extremity 10/24/2015  . Cerebrovascular accident (CVA) due to thrombosis of left middle cerebral artery ([place]) 09/11/2015  . Chronic anticoagulation 09/11/2015  . HLD (hyperlipidemia) 09/11/2015  . Muscle spasticity   . Hemiparesis affecting dominant side as late effect of stroke ([place])   . Gait disturbance, post-stroke   . Dyslipidemia   . Chronic systolic congestive heart failure ([place])   . Counseling regarding end of life decision making 02/02/2015  . Atrophic vaginitis 12/23/2014  . Pelvic pressure in female 12/23/2014  . Urethral caruncle 12/23/2014  . Cystocele, midline 12/23/2014  . Bradycardia 12/12/2014  . Junctional rhythm 12/09/2014  . Chronic systolic heart failure ([place]) 10/31/2014  . A-fib ([place]) 10/14/2014  . Irritable bowel syndrome (IBS) 04/09/2011  . POSTMENOPAUSAL STATUS 02/02/2009   . HYPERLIPIDEMIA 12/26/2006  . HYPERTENSION, BENIGN ESSENTIAL 12/26/2006  . ARTHRITIS, RHEUMATOID 12/26/2006   Alanson Puls, PT, DPT [place], [place] S 11/23/2015, 3:11 PM  [place] MAIN [facility] SERVICES [address] [place], [place], [address] Phone: [phone]   Fax:  [phone]  Name: Karen Bowman MRN: [id number] Date of Birth: [birth year]

## 2015-11-23 NOTE — Therapy (Signed)
[place] MAIN [facility] SERVICES [address] [place], [place], [address] Phone: [phone]   Fax:  [phone]  Occupational Therapy Treatment  Patient Details  Name: Karen Bowman MRN: [id number] Date of Birth: 07-Jan-1941 Referring Provider: Naaman Plummer  Encounter Date: 11/23/2015      OT End of Session - 11/23/15 1718    Visit Number 11   Number of Visits 24   Date for OT Re-Evaluation 12/20/15   Authorization Type 11 Medicare G code of 10   OT Start Time 1400   OT Stop Time 1430   OT Time Calculation (min) 30 min   Activity Tolerance Patient tolerated treatment well   Behavior During Therapy [facility] for tasks assessed/performed      Past Medical History:  Diagnosis Date  . Acute heart failure ([place])   . Anxiety   . Arrhythmia   . Arthritis   . Atrial fibrillation ([place])   . Heart murmur   . Hyperlipemia   . Hypertension   . Stroke [facility])     Past Surgical History:  Procedure Laterality Date  . ABDOMINAL HYSTERECTOMY    . APPENDECTOMY    . BLADDER SUSPENSION    . ELECTROPHYSIOLOGIC STUDY N/A 10/18/2014   Procedure: CARDIOVERSION;  Surgeon: Thayer Headings, MD;  Location: [facility] ORS;  Service: Cardiovascular;  Laterality: N/A;  . TEE WITHOUT CARDIOVERSION N/A 10/18/2014   Procedure: TRANSESOPHAGEAL ECHOCARDIOGRAM (TEE);  Surgeon: Thayer Headings, MD;  Location: [facility] ORS;  Service: Cardiovascular;  Laterality: N/A;  . TONSILLECTOMY    . TUMOR REMOVAL     right arm    There were no vitals filed for this visit.      Subjective Assessment - 11/23/15 1709    Subjective  "I am running out of steam"--pt feels tired today but know she needs to keep going to get everything done.   Patient is accompained by: Family member   Pertinent History Pt. is a 75 y.o. female who was admitted to [facility] on 06/28/2015 with a CVA and right sided hemiplegia. Pt. has a history of HTN, chronic systolic congestive heart failure, and A fib. Pt. had  inpatient rehab services, as well as [place] services. Pt. now presents for outpatient rehab services.   Limitations Dominant RUE strength and coordination.   Currently in Pain? No/denies                      OT Treatments/Exercises (OP) - 11/23/15 1710      Cognitive Exercises   Handwriting Pt's handwriting for her first and last name continue to improve with increasing legibility to 90% today using R hand but extra time to complete.     Fine Motor Coordination   Other Fine Motor Exercises Pt participated in fine motor control for 2pt pinch pattern using small clothespins with focus on isolated finger movements and not abducting her R shoulder to compensate esp when RUE was starting to fatigue.       Neurological Re-education Exercises   Other Exercises 1 Pt seen for R hand strengthening using Gripper with white spring set at 2nd and 3rd setting 3 sets of 15  with rest breaks as needed.  Able to use gripper to grab and release medium size pegs from board with cues to tech and not using shoulder elevation and trunk lean to compensate.  Pt instructed with teach back on how to do scapular depression and retraction exercise to help upper trapezius to  relax since she was elevating a lot and stated she has been having very bad headaches at home.                  OT Education - 11/23/15 1717    Education provided Yes   Education Details shoulder depression and retraction exercise to decrease tightness in upper trapezius muscles to help decrease headaches.   Person(s) Educated Patient   Methods Explanation;Demonstration   Comprehension Verbalized understanding;Returned demonstration             OT Long Term Goals - 11/23/15 1723      OT LONG TERM GOAL #3   Title Pt. will improve right lateral pinch strength by 3# to be able to cut meat and veggies efficiently in preparation for a meal.   Baseline 9#, Pt. requires increased time to complete with difficulty.   Time 12    Period Weeks   Status Partially Met     OT LONG TERM GOAL #4   Title Pt. improve right [place] by 5 sec. of speed on the 9-hole peg test to be able to fasten a bra.   Baseline 38 sec. Pt. has difficulty fastening a bra.   Time 12   Period Weeks   Status Partially Met     OT LONG TERM GOAL #5   Title Pt. will be able to write 3 sentences efficiently with 100% legibility.   Baseline 50% legibilty   Time 12   Period Weeks   Status Partially Met     OT LONG TERM GOAL #6   Title Pt. improve RUE strength to be able to independently lift, hold, and pour from a tea kettle.   Baseline Pt. is unable    Time 12   Period Weeks   Status On-going     OT LONG TERM GOAL #7   Title Pt. will independently be able to put small earrings with small plastic backings in standing up.   Baseline Pt. has difficulty in sitting.   Time 12   Period Weeks   Status On-going               Plan - 11/23/15 1719    Clinical Impression Statement Pt continues to make good progress with functional use of RUE and hand for handwriting, activities requiring grip and pinch and for coordination tasks.  Pt has been using trunk and shoulder elevation muscles to compensate and educated in exercises to help decrease tightness in upper traps.  She is using R hand with L for more ADLs and will benefit from continued therapy to be able to use her RUE and hand for tasks requiring fine motor movements.  She continues to be motivated and determined to be as active as possible.   Rehab Potential Good   Clinical Impairments Affecting Rehab Potential Positive Indicators: age, family support, and motivation. Negative Indicators: multiple comorbidities.   OT Frequency 2x / week   OT Duration 12 weeks   OT Treatment/Interventions Self-care/ADL training;Therapeutic exercise;Therapeutic exercises;Patient/family education;Therapeutic activities;DME and/or AE instruction;Passive range of motion;Functional Mobility Training;Moist  Heat;Ultrasound;Manual Therapy;Nurse, adult and Agree with Plan of Care Patient      Patient will benefit from skilled therapeutic intervention in order to improve the following deficits and impairments:  Pain, Decreased activity tolerance, Decreased balance, Impaired UE functional use, Impaired tone, Decreased coordination, Decreased strength, Decreased range of motion, Decreased knowledge of use of DME  Visit Diagnosis: Muscle weakness (generalized)  Other lack of  coordination    Problem List Patient Active Problem List   Diagnosis Date Noted  . Bleeding from varicose veins of lower extremity 10/24/2015  . Cerebrovascular accident (CVA) due to thrombosis of left middle cerebral artery ([place]) 09/11/2015  . Chronic anticoagulation 09/11/2015  . HLD (hyperlipidemia) 09/11/2015  . Muscle spasticity   . Hemiparesis affecting dominant side as late effect of stroke ([place])   . Gait disturbance, post-stroke   . Dyslipidemia   . Chronic systolic congestive heart failure ([place])   . Counseling regarding end of life decision making 02/02/2015  . Atrophic vaginitis 12/23/2014  . Pelvic pressure in female 12/23/2014  . Urethral caruncle 12/23/2014  . Cystocele, midline 12/23/2014  . Bradycardia 12/12/2014  . Junctional rhythm 12/09/2014  . Chronic systolic heart failure ([place]) 10/31/2014  . A-fib ([place]) 10/14/2014  . Irritable bowel syndrome (IBS) 04/09/2011  . POSTMENOPAUSAL STATUS 02/02/2009  . HYPERLIPIDEMIA 12/26/2006  . HYPERTENSION, BENIGN ESSENTIAL 12/26/2006  . ARTHRITIS, RHEUMATOID 12/26/2006   Chrys Racer, OTR/L ascom [phone] 11/23/15, 5:27 PM   [place] MAIN [facility] SERVICES [address] [place], [place], [address] Phone: [phone]   Fax:  [phone]  Name: SAMAA UEDA MRN: [id number] Date of Birth: 1940/05/29

## 2015-11-27 ENCOUNTER — Encounter: Payer: Self-pay | Admitting: Occupational Therapy

## 2015-11-27 ENCOUNTER — Ambulatory Visit: Payer: Medicare PPO | Admitting: Occupational Therapy

## 2015-11-27 ENCOUNTER — Encounter: Payer: Self-pay | Admitting: Physical Therapy

## 2015-11-27 ENCOUNTER — Ambulatory Visit: Payer: Medicare PPO | Admitting: Physical Therapy

## 2015-11-27 DIAGNOSIS — R262 Difficulty in walking, not elsewhere classified: Secondary | ICD-10-CM

## 2015-11-27 DIAGNOSIS — R2681 Unsteadiness on feet: Secondary | ICD-10-CM

## 2015-11-27 DIAGNOSIS — M6281 Muscle weakness (generalized): Secondary | ICD-10-CM

## 2015-11-27 DIAGNOSIS — R29810 Facial weakness: Secondary | ICD-10-CM | POA: Diagnosis not present

## 2015-11-27 DIAGNOSIS — R278 Other lack of coordination: Secondary | ICD-10-CM | POA: Diagnosis not present

## 2015-11-27 NOTE — Therapy (Signed)
[place] MAIN [facility] SERVICES [address] [place], [place], [address] Phone: [phone]   Fax:  [phone]  Physical Therapy Treatment  Patient Details  Name: Karen Bowman MRN: [id number] Date of Birth: 1940/07/04 No Data Recorded  Encounter Date: 11/27/2015      PT End of Session - 11/27/15 1540    Visit Number 8   Number of Visits 17   Date for PT Re-Evaluation 12-07-2015   Authorization Type g codes   Authorization Time Period 8/10   PT Start Time 0238   PT Stop Time 0320   PT Time Calculation (min) 42 min   Equipment Utilized During Treatment Gait belt   Activity Tolerance Patient tolerated treatment well   Behavior During Therapy WFL for tasks assessed/performed      Past Medical History:  Diagnosis Date  . Acute heart failure ([place])   . Anxiety   . Arrhythmia   . Arthritis   . Atrial fibrillation ([place])   . Heart murmur   . Hyperlipemia   . Hypertension   . Stroke [facility])     Past Surgical History:  Procedure Laterality Date  . ABDOMINAL HYSTERECTOMY    . APPENDECTOMY    . BLADDER SUSPENSION    . ELECTROPHYSIOLOGIC STUDY N/A 10/18/2014   Procedure: CARDIOVERSION;  Surgeon: Thayer Headings, MD;  Location: [facility] ORS;  Service: Cardiovascular;  Laterality: N/A;  . TEE WITHOUT CARDIOVERSION N/A 10/18/2014   Procedure: TRANSESOPHAGEAL ECHOCARDIOGRAM (TEE);  Surgeon: Thayer Headings, MD;  Location: [facility] ORS;  Service: Cardiovascular;  Laterality: N/A;  . TONSILLECTOMY    . TUMOR REMOVAL     right arm    There were no vitals filed for this visit.      Subjective Assessment - 11/27/15 1539    Subjective Pt reports that she feels good today. She feels that her balance is unsteady. She feels that her right leg can give out sometimes.   Pertinent History CVA on 06/27/15, went to inpatient rehab, now coming to outpatient OT and PT, she now mainly ambulates wiht SPC, a lot of difficulty with stairs   Limitations  Standing;Walking;House hold activities   How long can you stand comfortably? 1.5 hours   How long can you walk comfortably? limited community distances with [facility]   Patient Stated Goals to strengthen legs, walk better, go up and down stairs   Currently in Pain? No/denies   Pain Onset More than a month ago      NEUROMUSCULAR RE-EDUCATION Airex NBOS eyes open/closed x 30 seconds each; Airex NBOS eyes open horizontal and vertical head turns x 30 seconds; Airex cone  reaching crossing midline  Toe tapping 6 inch stool without UE assist Tandem gait in // bars x 4 laps  Side stepping on blue  foam balance beam x 5 lengths of the parallel bars  4 square fwd/bwd, side to side stepping/ diagonal stepping Standing on foam NBOS  sorting balls/ sorting shapes reaching  Therapeutic exercise; Leg press x 20 x 3 130 lbs,  heel raises with 90 lbs x 20 x 3 Heel raises standing  4 way hip RTB x 20  CGA and Min to mod verbal cues used throughout with increased in postural sway and LOB most seen with narrow base of support and while on uneven surfaces. Continues to have balance deficits typical with diagnosis. Patient performs intermediate level exercises without pain behaviors and needs verbal cuing for postural alignment and head positioning Tactile cues  and assistance needed to keep lower leg and knee in neutral to avoid compensations with ankle motions.                          PT Education - 11/27/15 1540    Education provided Yes   Education Details longer steps and heel toe gait   Person(s) Educated Patient   Methods Explanation   Comprehension Verbalized understanding             PT Long Term Goals - 10/31/15 1544      PT LONG TERM GOAL #1   Title Pt will score <15 seconds on 5x STS for reduced risk of falls within 8 weeks.   Baseline 21.36 sec   Time 8   Period Weeks   Status New     PT LONG TERM GOAL #2   Title Pt will score >1.0 m/s on 10 meter walk for  community ambulation within 8 weeks with LRAD.   Baseline 0.69 m/s without AD   Time 8   Period Weeks   Status New     PT LONG TERM GOAL #3   Title Pt will score <10 sec on TUG for community ambulation within 8 weeks.   Baseline 17.21 sec   Time 8   Period Weeks     PT LONG TERM GOAL #4   Title Pt will score >45/56 on Berg Balance test for reduced risk of falls within 8 weeks.   Baseline 41/56   Time 8   Period Weeks   Status New               Plan - 11/27/15 1541    Clinical Impression Statement Patient needs occasional verbal cueing to improve posture and cueing to correctly perform exercises slowly, holding at end of range to increase motor firing of desired muscle to encourage fatigue.   Rehab Potential Good   Clinical Impairments Affecting Rehab Potential comorbidities   PT Frequency 2x / week   PT Duration 8 weeks   PT Treatment/Interventions Gait training;Stair training;Therapeutic activities;Therapeutic exercise;Balance training;Neuromuscular re-education;Patient/family education   PT Next Visit Plan LE strengthening and balance training   PT Home Exercise Plan STS, LAQs, march, hip abd seated with RTB   Consulted and Agree with Plan of Care Patient      Patient will benefit from skilled therapeutic intervention in order to improve the following deficits and impairments:  Decreased balance, Decreased coordination, Decreased endurance, Decreased strength, Difficulty walking  Visit Diagnosis: Muscle weakness (generalized)  Difficulty in walking, not elsewhere classified  Unsteadiness on feet     Problem List Patient Active Problem List   Diagnosis Date Noted  . Bleeding from varicose veins of lower extremity 10/24/2015  . Cerebrovascular accident (CVA) due to thrombosis of left middle cerebral artery ([place]) 09/11/2015  . Chronic anticoagulation 09/11/2015  . HLD (hyperlipidemia) 09/11/2015  . Muscle spasticity   . Hemiparesis affecting dominant side  as late effect of stroke ([place])   . Gait disturbance, post-stroke   . Dyslipidemia   . Chronic systolic congestive heart failure ([place])   . Counseling regarding end of life decision making 02/02/2015  . Atrophic vaginitis 12/23/2014  . Pelvic pressure in female 12/23/2014  . Urethral caruncle 12/23/2014  . Cystocele, midline 12/23/2014  . Bradycardia 12/12/2014  . Junctional rhythm 12/09/2014  . Chronic systolic heart failure ([place]) 10/31/2014  . A-fib ([place]) 10/14/2014  . Irritable bowel syndrome (IBS) 04/09/2011  .  POSTMENOPAUSAL STATUS 02/02/2009  . HYPERLIPIDEMIA 12/26/2006  . HYPERTENSION, BENIGN ESSENTIAL 12/26/2006  . ARTHRITIS, RHEUMATOID 12/26/2006  Alanson Puls, PT, DPT  [place] 11/27/2015, 3:43 PM  [place] MAIN [facility] SERVICES [address] [place], [place], [address] Phone: [phone]   Fax:  [phone]  Name: TOBEY HADORN MRN: [id number] Date of Birth: 1940-07-03

## 2015-11-27 NOTE — Therapy (Signed)
[place] MAIN [facility] SERVICES [address] [place], [place], [address] Phone: [phone]   Fax:  [phone]  Occupational Therapy Treatment  Patient Details  Name: Karen Bowman MRN: [id number] Date of Birth: 12-Dec-1940 Referring Provider: Naaman Plummer  Encounter Date: 11/27/2015      OT End of Session - 11/27/15 1550    Visit Number 12   Number of Visits 24   Date for OT Re-Evaluation 12/20/15   Authorization Type 2 Medicare G code of 10   OT Start Time 1445   OT Stop Time 1533   OT Time Calculation (min) 48 min   Activity Tolerance Patient tolerated treatment well   Behavior During Therapy [facility] for tasks assessed/performed      Past Medical History:  Diagnosis Date  . Acute heart failure ([place])   . Anxiety   . Arrhythmia   . Arthritis   . Atrial fibrillation ([place])   . Heart murmur   . Hyperlipemia   . Hypertension   . Stroke [facility])     Past Surgical History:  Procedure Laterality Date  . ABDOMINAL HYSTERECTOMY    . APPENDECTOMY    . BLADDER SUSPENSION    . ELECTROPHYSIOLOGIC STUDY N/A 10/18/2014   Procedure: CARDIOVERSION;  Surgeon: Thayer Headings, MD;  Location: [facility] ORS;  Service: Cardiovascular;  Laterality: N/A;  . TEE WITHOUT CARDIOVERSION N/A 10/18/2014   Procedure: TRANSESOPHAGEAL ECHOCARDIOGRAM (TEE);  Surgeon: Thayer Headings, MD;  Location: [facility] ORS;  Service: Cardiovascular;  Laterality: N/A;  . TONSILLECTOMY    . TUMOR REMOVAL     right arm    There were no vitals filed for this visit.      Subjective Assessment - 11/27/15 1540    Subjective  "I am so tired and I need a new neck!"   Patient is accompained by: Family member   Pertinent History Pt. is a 75 y.o. female who was admitted to [facility] on 06/28/2015 with a CVA and right sided hemiplegia. Pt. has a history of HTN, chronic systolic congestive heart failure, and A fib. Pt. had inpatient rehab services, as well as [place] services. Pt. now presents for  outpatient rehab services.   Limitations Dominant RUE strength and coordination.   Currently in Pain? No/denies                      OT Treatments/Exercises (OP) - 11/27/15 1541      ADLs   Grooming Worked on fine motor skills using extra small beads with needle nose tongs to simulate putting earrings in using both hands.  Pt is now able to use R hand with L to manipulate small earrings in both ears. Goal met.   UB Dressing Pt is now able to use R hand with L to fasten and unfasten bra behind her back independently every day.  Goal met.   ADL Comments Worked on writing 3 sentances with R hand using pen with built in gripper with 80% legibility and extra time.  She was able to complete first 2 sentences as stated on paper but third sentence had mistakes from losing track of words on paper.  She demonstrated improved control for first 2 sentences as far as legibility and as she fatigued it became less legible.  Isolated control of finger prehension pattern and small movements in pronation.     Fine Motor Coordination   Other Fine Motor Exercises Pt completed Nine Hole Peg test with R  hand in 33.25 seconds.                       OT Long Term Goals - 11/27/15 1753      OT LONG TERM GOAL #3   Title Pt. will improve right lateral pinch strength by 3# to be able to cut meat and veggies efficiently in preparation for a meal.   Baseline 9#, Pt. requires increased time to complete with difficulty.   Time 12   Period Weeks   Status Partially Met     OT LONG TERM GOAL #4   Title Pt. improve right [place] by 5 sec. of speed on the 9-hole peg test to be able to fasten a bra.   Baseline 38 sec. Pt. has difficulty fastening a bra.   Time 12   Period Weeks   Status Achieved     OT LONG TERM GOAL #5   Title Pt. will be able to write 3 sentences efficiently with 100% legibility.   Baseline 50% legibilty   Time 12   Period Weeks   Status Partially Met     OT LONG TERM GOAL  #6   Title Pt. improve RUE strength to be able to independently lift, hold, and pour from a tea kettle.   Baseline Pt. is unable    Time 12   Period Weeks   Status On-going     OT LONG TERM GOAL #7   Title Pt. will independently be able to put small earrings with small plastic backings in standing up.   Baseline Pt. has difficulty in sitting.   Time 12   Period Weeks   Status Achieved               Plan - 11/27/15 1754    Clinical Impression Statement Pt continues to make good functional progress with ADLs and has met 2 goals today.  She is now able to use R hand with L for putting earrings in her ears in sitting or standing position.  She completed the 9 hold peg test in 33 seconds which was 5 seconds faster than at baseline of 38 seconds.  She continues to work on legibility with writing with R hand.  Reviewed shoulder exercises to decrease tightness in upper trapezius muscles today and she reports that they have been helping to decrease neck tightness.        Patient will benefit from skilled therapeutic intervention in order to improve the following deficits and impairments:     Visit Diagnosis: Muscle weakness (generalized)    Problem List Patient Active Problem List   Diagnosis Date Noted  . Bleeding from varicose veins of lower extremity 10/24/2015  . Cerebrovascular accident (CVA) due to thrombosis of left middle cerebral artery ([place]) 09/11/2015  . Chronic anticoagulation 09/11/2015  . HLD (hyperlipidemia) 09/11/2015  . Muscle spasticity   . Hemiparesis affecting dominant side as late effect of stroke ([place])   . Gait disturbance, post-stroke   . Dyslipidemia   . Chronic systolic congestive heart failure ([place])   . Counseling regarding end of life decision making 02/02/2015  . Atrophic vaginitis 12/23/2014  . Pelvic pressure in female 12/23/2014  . Urethral caruncle 12/23/2014  . Cystocele, midline 12/23/2014  . Bradycardia 12/12/2014  . Junctional rhythm  12/09/2014  . Chronic systolic heart failure ([place]) 10/31/2014  . A-fib ([place]) 10/14/2014  . Irritable bowel syndrome (IBS) 04/09/2011  . POSTMENOPAUSAL STATUS 02/02/2009  . HYPERLIPIDEMIA 12/26/2006  . HYPERTENSION,  BENIGN ESSENTIAL 12/26/2006  . ARTHRITIS, RHEUMATOID 12/26/2006     Chrys Racer, OTR/L ascom [phone] 11/27/15, 5:58 PM   [place] MAIN [facility] SERVICES [address] [place], [place], [address] Phone: [phone]   Fax:  [phone]  Name: Karen Bowman MRN: [id number] Date of Birth: Aug 29, 1940

## 2015-11-27 NOTE — Therapy (Signed)
[place] MAIN [facility] SERVICES [address] [place], [place], [address] Phone: [phone]   Fax:  [phone]  Occupational Therapy Treatment  Patient Details  Name: Karen Bowman MRN: [id number] Date of Birth: 12-Dec-1940 Referring Provider: Naaman Plummer  Encounter Date: 11/27/2015      OT End of Session - 11/27/15 1550    Visit Number 12   Number of Visits 24   Date for OT Re-Evaluation 12/20/15   Authorization Type 2 Medicare G code of 10   OT Start Time 1445   OT Stop Time 1533   OT Time Calculation (min) 48 min   Activity Tolerance Patient tolerated treatment well   Behavior During Therapy [facility] for tasks assessed/performed      Past Medical History:  Diagnosis Date  . Acute heart failure ([place])   . Anxiety   . Arrhythmia   . Arthritis   . Atrial fibrillation ([place])   . Heart murmur   . Hyperlipemia   . Hypertension   . Stroke [facility])     Past Surgical History:  Procedure Laterality Date  . ABDOMINAL HYSTERECTOMY    . APPENDECTOMY    . BLADDER SUSPENSION    . ELECTROPHYSIOLOGIC STUDY N/A 10/18/2014   Procedure: CARDIOVERSION;  Surgeon: Thayer Headings, MD;  Location: [facility] ORS;  Service: Cardiovascular;  Laterality: N/A;  . TEE WITHOUT CARDIOVERSION N/A 10/18/2014   Procedure: TRANSESOPHAGEAL ECHOCARDIOGRAM (TEE);  Surgeon: Thayer Headings, MD;  Location: [facility] ORS;  Service: Cardiovascular;  Laterality: N/A;  . TONSILLECTOMY    . TUMOR REMOVAL     right arm    There were no vitals filed for this visit.      Subjective Assessment - 11/27/15 1540    Subjective  "I am so tired and I need a new neck!"   Patient is accompained by: Family member   Pertinent History Pt. is a 75 y.o. female who was admitted to [facility] on 06/28/2015 with a CVA and right sided hemiplegia. Pt. has a history of HTN, chronic systolic congestive heart failure, and A fib. Pt. had inpatient rehab services, as well as [place] services. Pt. now presents for  outpatient rehab services.   Limitations Dominant RUE strength and coordination.   Currently in Pain? No/denies                      OT Treatments/Exercises (OP) - 11/27/15 1541      ADLs   Grooming Worked on fine motor skills using extra small beads with needle nose tongs to simulate putting earrings in using both hands.  Pt is now able to use R hand with L to manipulate small earrings in both ears. Goal met.   UB Dressing Pt is now able to use R hand with L to fasten and unfasten bra behind her back independently every day.  Goal met.   ADL Comments Worked on writing 3 sentances with R hand using pen with built in gripper with 80% legibility and extra time.  She was able to complete first 2 sentences as stated on paper but third sentence had mistakes from losing track of words on paper.  She demonstrated improved control for first 2 sentences as far as legibility and as she fatigued it became less legible.  Isolated control of finger prehension pattern and small movements in pronation.     Fine Motor Coordination   Other Fine Motor Exercises Pt completed Nine Hole Peg test with R  hand in 33.25 seconds.                       OT Long Term Goals - 11/23/15 1723      OT LONG TERM GOAL #3   Title Pt. will improve right lateral pinch strength by 3# to be able to cut meat and veggies efficiently in preparation for a meal.   Baseline 9#, Pt. requires increased time to complete with difficulty.   Time 12   Period Weeks   Status Partially Met     OT LONG TERM GOAL #4   Title Pt. improve right [place] by 5 sec. of speed on the 9-hole peg test to be able to fasten a bra.   Baseline 38 sec. Pt. has difficulty fastening a bra.   Time 12   Period Weeks   Status Partially Met     OT LONG TERM GOAL #5   Title Pt. will be able to write 3 sentences efficiently with 100% legibility.   Baseline 50% legibilty   Time 12   Period Weeks   Status Partially Met     OT LONG TERM  GOAL #6   Title Pt. improve RUE strength to be able to independently lift, hold, and pour from a tea kettle.   Baseline Pt. is unable    Time 12   Period Weeks   Status On-going     OT LONG TERM GOAL #7   Title Pt. will independently be able to put small earrings with small plastic backings in standing up.   Baseline Pt. has difficulty in sitting.   Time 12   Period Weeks   Status On-going             Patient will benefit from skilled therapeutic intervention in order to improve the following deficits and impairments:     Visit Diagnosis: Muscle weakness (generalized)    Problem List Patient Active Problem List   Diagnosis Date Noted  . Bleeding from varicose veins of lower extremity 10/24/2015  . Cerebrovascular accident (CVA) due to thrombosis of left middle cerebral artery ([place]) 09/11/2015  . Chronic anticoagulation 09/11/2015  . HLD (hyperlipidemia) 09/11/2015  . Muscle spasticity   . Hemiparesis affecting dominant side as late effect of stroke ([place])   . Gait disturbance, post-stroke   . Dyslipidemia   . Chronic systolic congestive heart failure ([place])   . Counseling regarding end of life decision making 02/02/2015  . Atrophic vaginitis 12/23/2014  . Pelvic pressure in female 12/23/2014  . Urethral caruncle 12/23/2014  . Cystocele, midline 12/23/2014  . Bradycardia 12/12/2014  . Junctional rhythm 12/09/2014  . Chronic systolic heart failure ([place]) 10/31/2014  . A-fib ([place]) 10/14/2014  . Irritable bowel syndrome (IBS) 04/09/2011  . POSTMENOPAUSAL STATUS 02/02/2009  . HYPERLIPIDEMIA 12/26/2006  . HYPERTENSION, BENIGN ESSENTIAL 12/26/2006  . ARTHRITIS, RHEUMATOID 12/26/2006     Chrys Racer, OTR/L ascom [phone] 11/27/15, 3:58 PM   [place] MAIN [facility] SERVICES [address] [place], [place], [address] Phone: [phone]   Fax:  [phone]  Name: Karen Bowman MRN: [id number] Date of Birth:  10-24-1940

## 2015-11-29 ENCOUNTER — Ambulatory Visit: Payer: Medicare PPO | Admitting: Physical Therapy

## 2015-11-29 ENCOUNTER — Ambulatory Visit: Payer: Medicare PPO | Admitting: Occupational Therapy

## 2015-11-29 DIAGNOSIS — M6281 Muscle weakness (generalized): Secondary | ICD-10-CM | POA: Diagnosis not present

## 2015-11-29 DIAGNOSIS — R262 Difficulty in walking, not elsewhere classified: Secondary | ICD-10-CM

## 2015-11-29 DIAGNOSIS — R29810 Facial weakness: Secondary | ICD-10-CM | POA: Diagnosis not present

## 2015-11-29 DIAGNOSIS — R278 Other lack of coordination: Secondary | ICD-10-CM

## 2015-11-29 DIAGNOSIS — R2681 Unsteadiness on feet: Secondary | ICD-10-CM | POA: Diagnosis not present

## 2015-11-29 NOTE — Therapy (Signed)
[place] MAIN [facility] SERVICES [address] [place], [place], [address] Phone: [phone]   Fax:  [phone]  Occupational Therapy Treatment  Patient Details  Name: Karen Bowman MRN: [id number] Date of Birth: 20-Sep-1940 Referring Provider: Naaman Plummer  Encounter Date: 11/29/2015      OT End of Session - 11/29/15 1511    Visit Number 13   Number of Visits 24   Date for OT Re-Evaluation 12/20/15   Authorization Type 3 Medicare G code of 10   OT Start Time 1440   OT Stop Time 1515   OT Time Calculation (min) 35 min   Activity Tolerance Patient tolerated treatment well   Behavior During Therapy [facility] for tasks assessed/performed      Past Medical History:  Diagnosis Date  . Acute heart failure ([place])   . Anxiety   . Arrhythmia   . Arthritis   . Atrial fibrillation ([place])   . Heart murmur   . Hyperlipemia   . Hypertension   . Stroke [facility])     Past Surgical History:  Procedure Laterality Date  . ABDOMINAL HYSTERECTOMY    . APPENDECTOMY    . BLADDER SUSPENSION    . ELECTROPHYSIOLOGIC STUDY N/A 10/18/2014   Procedure: CARDIOVERSION;  Surgeon: Thayer Headings, MD;  Location: [facility] ORS;  Service: Cardiovascular;  Laterality: N/A;  . TEE WITHOUT CARDIOVERSION N/A 10/18/2014   Procedure: TRANSESOPHAGEAL ECHOCARDIOGRAM (TEE);  Surgeon: Thayer Headings, MD;  Location: [facility] ORS;  Service: Cardiovascular;  Laterality: N/A;  . TONSILLECTOMY    . TUMOR REMOVAL     right arm    There were no vitals filed for this visit.      Subjective Assessment - 11/29/15 1449    Subjective  "Do you know anywhere I can get a new neck?"   Patient is accompained by: Family member   Pertinent History Pt. is a 75 y.o. female who was admitted to [facility] on 06/28/2015 with a CVA and right sided hemiplegia. Pt. has a history of HTN, chronic systolic congestive heart failure, and A fib. Pt. had inpatient rehab services, as well as [place] services. Pt. now presents  for outpatient rehab services.   Limitations Dominant RUE strength and coordination.   Currently in Pain? Yes   Pain Score 5    Pain Location Hip   Pain Orientation Left   Pain Descriptors / Indicators Nagging;Aching   Pain Type Chronic pain      OT TREATMENT    Neuro muscular re-education:  Pt. performed [place] tasks using the Grooved pegboard. Pt. worked on grasping the grooved pegs from a horizontal position, and moving the pegs to a vertical position in the hand to prepare for placing them in the grooved slot. Pt. Worked on thumb opposition to the 2nd through 5th digits.  Therapeutic Exercise:  Pt. performed gross gripping with grip strengthener. Pt. worked on sustaining grip while grasping pegs and reaching at various heights. Gripper was placed in the 2nd resistive slot with the white resistive spring. Pt. then worked on the 3rd resistive placing them in a container at the tabletop. Pt. Worked on pinch strengthening in the left hand for lateral, and 3pt. pinch using yellow, red, green, and blue resistive clips. Pt. worked on placing the clips at various vertical and horizontal angles. Tactile and verbal cues were required for eliciting the desired movement.  OT Education - 11/29/15 1510    Education provided Yes   Education Details grip strength, pinch strength, and coordination skills.   Person(s) Educated Patient   Methods Explanation   Comprehension Verbalized understanding             OT Long Term Goals - 11/27/15 1753      OT LONG TERM GOAL #3   Title Pt. will improve right lateral pinch strength by 3# to be able to cut meat and veggies efficiently in preparation for a meal.   Baseline 9#, Pt. requires increased time to complete with difficulty.   Time 12   Period Weeks   Status Partially Met     OT LONG TERM GOAL #4   Title Pt. improve right [place] by 5 sec. of speed on the 9-hole peg test to be able to fasten a  bra.   Baseline 38 sec. Pt. has difficulty fastening a bra.   Time 12   Period Weeks   Status Achieved     OT LONG TERM GOAL #5   Title Pt. will be able to write 3 sentences efficiently with 100% legibility.   Baseline 50% legibilty   Time 12   Period Weeks   Status Partially Met     OT LONG TERM GOAL #6   Title Pt. improve RUE strength to be able to independently lift, hold, and pour from a tea kettle.   Baseline Pt. is unable    Time 12   Period Weeks   Status On-going     OT LONG TERM GOAL #7   Title Pt. will independently be able to put small earrings with small plastic backings in standing up.   Baseline Pt. has difficulty in sitting.   Time 12   Period Weeks   Status Achieved               Plan - 11/29/15 1515    Clinical Impression Statement Pt. continues to improve with LUE functioning. Pt. is making progress, however continues to require work on grip strength, pinch strength, and FMC skills needed for writing, and cutting vegitables.    Rehab Potential Good   Clinical Impairments Affecting Rehab Potential Positive Indicators: age, family support, and motivation. Negative Indicators: multiple comorbidities.   OT Frequency 2x / week   OT Duration 12 weeks   OT Treatment/Interventions Self-care/ADL training;Therapeutic exercise;Therapeutic exercises;Patient/family education;Therapeutic activities;DME and/or AE instruction;Passive range of motion;Functional Mobility Training;Moist Heat;Ultrasound;Manual Therapy;Electrical Stimulation   Consulted and Agree with Plan of Care Patient      Patient will benefit from skilled therapeutic intervention in order to improve the following deficits and impairments:     Visit Diagnosis: Muscle weakness (generalized)  Other lack of coordination    Problem List Patient Active Problem List   Diagnosis Date Noted  . Bleeding from varicose veins of lower extremity 10/24/2015  . Cerebrovascular accident (CVA) due to  thrombosis of left middle cerebral artery (HCC) 09/11/2015  . Chronic anticoagulation 09/11/2015  . HLD (hyperlipidemia) 09/11/2015  . Muscle spasticity   . Hemiparesis affecting dominant side as late effect of stroke (H[facility])   . Gait disturbance, post-stroke   . Dyslipidemia   . Chronic systolic congestive heart failure ([facility])   . Counseling regarding end of life decision making 02/02/2015  . Atrophic vaginitis 12/23/2014  . Pelvic pressure in female 12/23/2014  . Urethral caruncle 12/23/2014  . Cystocele, midline 12/23/2014  . Bradycardia 12/12/2014  . Junctional rhythm 12/09/2014  . Chronic systolic  heart failure ([place]) 10/31/2014  . A-fib ([place]) 10/14/2014  . Irritable bowel syndrome (IBS) 04/09/2011  . POSTMENOPAUSAL STATUS 02/02/2009  . HYPERLIPIDEMIA 12/26/2006  . HYPERTENSION, BENIGN ESSENTIAL 12/26/2006  . ARTHRITIS, RHEUMATOID 12/26/2006    Harrel Carina, MS, OTR/L 11/29/2015, 3:38 PM  [place] MAIN [facility] SERVICES [address] [place], [place], [address] Phone: [phone]   Fax:  [phone]  Name: Karen Bowman MRN: [id number] Date of Birth: [birth year]

## 2015-11-29 NOTE — Patient Instructions (Signed)
OT TREATMENT    Neuro muscular re-education:  Pt. performed [facility] tasks using the Grooved pegboard. Pt. worked on grasping the grooved pegs from a horizontal position, and moving the pegs to a vertical position in the hand to prepare for placing them in the grooved slot. Pt. Worked on thumb opposition to the 2nd through 5th digits.  Therapeutic Exercise:  Pt. performed gross gripping with grip strengthener. Pt. worked on sustaining grip while grasping pegs and reaching at various heights. Gripper was placed in the 2nd resistive slot with the white resistive spring. Pt. then worked on the 3rd resistive placing them in a container at the tabletop. Pt. Worked on pinch strengthening in the left hand for lateral, and 3pt. pinch using yellow, red, green, and blue resistive clips. Pt. worked on placing the clips at various vertical and horizontal angles. Tactile and verbal cues were required for eliciting the desired movement.

## 2015-11-29 NOTE — Therapy (Signed)
[place] MAIN [facility] SERVICES [address] [place], [place], [address] Phone: [phone]   Fax:  [phone]  Physical Therapy Treatment  Patient Details  Name: Karen Bowman MRN: [id number] Date of Birth: 04-07-1940 No Data Recorded  Encounter Date: 11/29/2015      PT End of Session - 11/29/15 1611    Visit Number 9   Number of Visits 17   Date for PT Re-Evaluation 11/30/2015   Authorization Type g codes   Authorization Time Period 9/10   PT Start Time 0315   PT Stop Time 0400   PT Time Calculation (min) 45 min   Equipment Utilized During Treatment Gait belt   Activity Tolerance Patient tolerated treatment well   Behavior During Therapy [facility] for tasks assessed/performed      Past Medical History:  Diagnosis Date  . Acute heart failure ([place])   . Anxiety   . Arrhythmia   . Arthritis   . Atrial fibrillation ([place])   . Heart murmur   . Hyperlipemia   . Hypertension   . Stroke [facility])     Past Surgical History:  Procedure Laterality Date  . ABDOMINAL HYSTERECTOMY    . APPENDECTOMY    . BLADDER SUSPENSION    . ELECTROPHYSIOLOGIC STUDY N/A 10/18/2014   Procedure: CARDIOVERSION;  Surgeon: Thayer Headings, MD;  Location: [facility] ORS;  Service: Cardiovascular;  Laterality: N/A;  . TEE WITHOUT CARDIOVERSION N/A 10/18/2014   Procedure: TRANSESOPHAGEAL ECHOCARDIOGRAM (TEE);  Surgeon: Thayer Headings, MD;  Location: [facility] ORS;  Service: Cardiovascular;  Laterality: N/A;  . TONSILLECTOMY    . TUMOR REMOVAL     right arm    There were no vitals filed for this visit.      Subjective Assessment - 11/29/15 1609    Subjective Pt reports that she feels good today. She feels that her balance is unsteady. She feels that her right leg can give out sometimes.   Pertinent History CVA on 06/27/15, went to inpatient rehab, now coming to outpatient OT and PT, she now mainly ambulates wiht SPC, a lot of difficulty with stairs   Limitations  Standing;Walking;House hold activities   How long can you stand comfortably? 1.5 hours   How long can you walk comfortably? limited community distances with [facility]   Patient Stated Goals to strengthen legs, walk better, go up and down stairs   Pain Score 5    Pain Location Hip   Pain Onset More than a month ago      Therex:  LAQs 2x10 Standing march  2x10 with 3# Standing hip SLR, abd, ext with GTB 2x10 each Seated clamshell with GTB 2x10 Heel raises with UE support 2 x 10; Sit to stand without UE support 2x10  NEUROMUSCULAR RE-EDUCATION Airex NBOS eyes open/closed x 30 seconds each; Airex NBOS eyes open horizontal and vertical head turns x 30 seconds; Airex cone  reaching crossing midline  Toe tapping 6 inch stool without UE assist Tandem gait in // bars x 4 laps  Side stepping on blue  foam balance beam x 5 lengths of the parallel bars  Min cueing needed to appropriately perform tasks with leg, hand, and head position. Decreased coordination demonstrated requiring consistent verbal cueing to correct form. Cognitive understanding of task was delayed. Patient continues to demonstrate some in coordination of movement with select exercises such as stepping backwards. Patient responds well to verbal and tactile cues to correct form and technique.  CGA to SBA  for safety with activities.                          PT Education - 11/29/15 1610    Education provided Yes   Education Details heel toe gait   Person(s) Educated Patient   Methods Explanation   Comprehension Verbalized understanding             PT Long Term Goals - 10/31/15 1544      PT LONG TERM GOAL #1   Title Pt will score <15 seconds on 5x STS for reduced risk of falls within 8 weeks.   Baseline 21.36 sec   Time 8   Period Weeks   Status New     PT LONG TERM GOAL #2   Title Pt will score >1.0 m/s on 10 meter walk for community ambulation within 8 weeks with LRAD.   Baseline 0.69 m/s without  AD   Time 8   Period Weeks   Status New     PT LONG TERM GOAL #3   Title Pt will score <10 sec on TUG for community ambulation within 8 weeks.   Baseline 17.21 sec   Time 8   Period Weeks     PT LONG TERM GOAL #4   Title Pt will score >45/56 on Berg Balance test for reduced risk of falls within 8 weeks.   Baseline 41/56   Time 8   Period Weeks   Status New               Plan - 11/29/15 1612    Clinical Impression Statement Pt demonstrated decreased endurance with sit-to-stands exhibiting fatigue towards the end of second set with increased breathing rate   Rehab Potential Good   Clinical Impairments Affecting Rehab Potential comorbidities   PT Frequency 2x / week   PT Duration 8 weeks   PT Treatment/Interventions Gait training;Stair training;Therapeutic activities;Therapeutic exercise;Balance training;Neuromuscular re-education;Patient/family education   PT Next Visit Plan LE strengthening and balance training   PT Home Exercise Plan STS, LAQs, march, hip abd seated with RTB   Consulted and Agree with Plan of Care Patient      Patient will benefit from skilled therapeutic intervention in order to improve the following deficits and impairments:  Decreased balance, Decreased coordination, Decreased endurance, Decreased strength, Difficulty walking  Visit Diagnosis: Muscle weakness (generalized)  Other lack of coordination  Difficulty in walking, not elsewhere classified     Problem List Patient Active Problem List   Diagnosis Date Noted  . Bleeding from varicose veins of lower extremity 10/24/2015  . Cerebrovascular accident (CVA) due to thrombosis of left middle cerebral artery ([place]) 09/11/2015  . Chronic anticoagulation 09/11/2015  . HLD (hyperlipidemia) 09/11/2015  . Muscle spasticity   . Hemiparesis affecting dominant side as late effect of stroke ([place])   . Gait disturbance, post-stroke   . Dyslipidemia   . Chronic systolic congestive heart failure  ([place])   . Counseling regarding end of life decision making 02/02/2015  . Atrophic vaginitis 12/23/2014  . Pelvic pressure in female 12/23/2014  . Urethral caruncle 12/23/2014  . Cystocele, midline 12/23/2014  . Bradycardia 12/12/2014  . Junctional rhythm 12/09/2014  . Chronic systolic heart failure ([place]) 10/31/2014  . A-fib ([place]) 10/14/2014  . Irritable bowel syndrome (IBS) 04/09/2011  . POSTMENOPAUSAL STATUS 02/02/2009  . HYPERLIPIDEMIA 12/26/2006  . HYPERTENSION, BENIGN ESSENTIAL 12/26/2006  . ARTHRITIS, RHEUMATOID 12/26/2006   Alanson Puls, PT, DPT [place], [place]  S 11/29/2015, 4:14 PM  [place] MAIN [place] SERVICES [address] [place], [place], [address] Phone: [phone]   Fax:  [phone]  Name: KATHREEN CARBONE MRN: [id number] Date of Birth: 05-Aug-1940

## 2015-12-05 ENCOUNTER — Ambulatory Visit: Payer: Medicare PPO | Attending: Physical Medicine & Rehabilitation | Admitting: Physical Therapy

## 2015-12-05 ENCOUNTER — Ambulatory Visit: Payer: Medicare PPO | Admitting: Occupational Therapy

## 2015-12-05 ENCOUNTER — Encounter: Payer: Self-pay | Admitting: Physical Therapy

## 2015-12-05 DIAGNOSIS — M6281 Muscle weakness (generalized): Secondary | ICD-10-CM | POA: Insufficient documentation

## 2015-12-05 DIAGNOSIS — R278 Other lack of coordination: Secondary | ICD-10-CM | POA: Insufficient documentation

## 2015-12-05 DIAGNOSIS — R262 Difficulty in walking, not elsewhere classified: Secondary | ICD-10-CM | POA: Diagnosis not present

## 2015-12-05 NOTE — Therapy (Signed)
[place] MAIN [facility] SERVICES [address] [place], [place], [address] Phone: [phone]   Fax:  [phone]  Physical Therapy Treatment/progress note  Patient Details  Name: Karen Bowman MRN: [id number] Date of Birth: 06/30/1940 No Data Recorded  Encounter Date: 12/05/2015      PT End of Session - 12/05/15 1510    Visit Number 10   Number of Visits 17   Date for PT Re-Evaluation 12-07-2015   Authorization Type g codes   Authorization Time Period 10/10   Equipment Utilized During Treatment Gait belt   Activity Tolerance Patient tolerated treatment well   Behavior During Therapy [facility] for tasks assessed/performed      Past Medical History:  Diagnosis Date  . Acute heart failure ([place])   . Anxiety   . Arrhythmia   . Arthritis   . Atrial fibrillation ([place])   . Heart murmur   . Hyperlipemia   . Hypertension   . Stroke [facility])     Past Surgical History:  Procedure Laterality Date  . ABDOMINAL HYSTERECTOMY    . APPENDECTOMY    . BLADDER SUSPENSION    . ELECTROPHYSIOLOGIC STUDY N/A 10/18/2014   Procedure: CARDIOVERSION;  Surgeon: Thayer Headings, MD;  Location: [facility] ORS;  Service: Cardiovascular;  Laterality: N/A;  . TEE WITHOUT CARDIOVERSION N/A 10/18/2014   Procedure: TRANSESOPHAGEAL ECHOCARDIOGRAM (TEE);  Surgeon: Thayer Headings, MD;  Location: [facility] ORS;  Service: Cardiovascular;  Laterality: N/A;  . TONSILLECTOMY    . TUMOR REMOVAL     right arm    There were no vitals filed for this visit.      Subjective Assessment - 12/05/15 1509    Subjective Pt reports that she feels good today. She feels that her balance is unsteady. She feels that her right leg can give out sometimes. She has arthritis in several places and today he neck is bothering her. k is    Pertinent History CVA on 06/27/15, went to inpatient rehab, now coming to outpatient OT and PT, she now mainly ambulates wiht SPC, a lot of difficulty with stairs   Limitations Standing;Walking;House hold activities   How long can you stand comfortably? 1.5 hours   How long can you walk comfortably? limited community distances with [facility]   Patient Stated Goals to strengthen legs, walk better, go up and down stairs   Currently in Pain? Yes   Pain Score 5    Pain Location Hip   Pain Orientation Left   Pain Descriptors / Indicators Aching   Pain Type Chronic pain   Pain Onset More than a month ago        OUTCOME MEASURES: TEST  Outcome  Interpretation   5 times sit<>stand  20.85sec  >8 yo, >15 sec indicates increased risk for falls   10 meter walk test     .81          m/s  <1.0 m/s indicates increased risk for falls; limited community ambulator   Timed up and Go    14.89             sec  <14 sec indicates increased risk for falls        Heel raises x 10 x 2  Resisted side-steeping RTB 4 lengths x 2; Standing mini squats 2 x 10 with RTB around knees to encourage abduction; Sit to stand without UE support 2 x 10; Step-ups to 6" step x 10 bilateral; Quantum leg press 100 feet  CGA and Min to mod verbal cues used throughout with increased in postural sway and LOB most seen with narrow base of support and while on uneven surfaces. Continues to have balance deficits typical with diagnosis. Patient performs intermediate level exercises without pain behaviors and needs verbal cuing for postural alignment and head positioning                         PT Education - 27-Dec-2015 1510    Education provided Yes   Education Details increasing her leg step height   Person(s) Educated Patient   Methods Explanation   Comprehension Verbalized understanding             PT Long Term Goals - 12/27/15 1718      PT LONG TERM GOAL #1   Title Pt will score <15 seconds on 5x STS for reduced risk of falls within 8 weeks.   Time 8   Period Weeks   Status Partially Met     PT LONG TERM GOAL #2   Title Pt will score >1.0 m/s on 10 meter walk  for community ambulation within 8 weeks with LRAD.   Time 8   Status Partially Met     PT LONG TERM GOAL #3   Title Pt will score <10 sec on TUG for community ambulation within 8 weeks.   Period Weeks   Status Partially Met     PT LONG TERM GOAL #4   Title Pt will score >45/56 on Berg Balance test for reduced risk of falls within 8 weeks.   Time 8   Status On-going             Patient will benefit from skilled therapeutic intervention in order to improve the following deficits and impairments:     Visit Diagnosis: Muscle weakness (generalized)  Other lack of coordination  Difficulty in walking, not elsewhere classified       G-Codes - 27-Dec-2015 1719    Functional Assessment Tool Used berg, 10 m walk, TUG, 5x STS   Functional Limitation Mobility: Walking and moving around   Mobility: Walking and Moving Around Current Status ([id number]) At least 20 percent but less than 40 percent impaired, limited or restricted   Mobility: Walking and Moving Around Goal Status [phone]) At least 1 percent but less than 20 percent impaired, limited or restricted      Problem List Patient Active Problem List   Diagnosis Date Noted  . Bleeding from varicose veins of lower extremity 10/24/2015  . Cerebrovascular accident (CVA) due to thrombosis of left middle cerebral artery ([place]) 09/11/2015  . Chronic anticoagulation 09/11/2015  . HLD (hyperlipidemia) 09/11/2015  . Muscle spasticity   . Hemiparesis affecting dominant side as late effect of stroke ([place])   . Gait disturbance, post-stroke   . Dyslipidemia   . Chronic systolic congestive heart failure ([place])   . Counseling regarding end of life decision making 02/02/2015  . Atrophic vaginitis 12/23/2014  . Pelvic pressure in female 12/23/2014  . Urethral caruncle 12/23/2014  . Cystocele, midline 12/23/2014  . Bradycardia 12/12/2014  . Junctional rhythm 12/09/2014  . Chronic systolic heart failure ([place]) 10/31/2014  . A-fib ([place])  10/14/2014  . Irritable bowel syndrome (IBS) 04/09/2011  . POSTMENOPAUSAL STATUS 02/02/2009  . HYPERLIPIDEMIA 12/26/2006  . HYPERTENSION, BENIGN ESSENTIAL 12/26/2006  . ARTHRITIS, RHEUMATOID 12/26/2006   Alanson Puls, PT, DPT [place], [place] S 12/06/2015, 8:51 AM  [place]  MAIN [facility] SERVICES [place], [place], [address] Phone: [phone]   Fax:  [phone]  Name: Karen Bowman MRN: [id number] Date of Birth: 1940-05-29

## 2015-12-07 ENCOUNTER — Ambulatory Visit: Payer: Medicare PPO | Admitting: Occupational Therapy

## 2015-12-07 ENCOUNTER — Ambulatory Visit: Payer: Medicare PPO | Admitting: Physical Therapy

## 2015-12-07 ENCOUNTER — Encounter: Payer: Self-pay | Admitting: Physical Therapy

## 2015-12-07 DIAGNOSIS — M6281 Muscle weakness (generalized): Secondary | ICD-10-CM

## 2015-12-07 DIAGNOSIS — R278 Other lack of coordination: Secondary | ICD-10-CM | POA: Diagnosis not present

## 2015-12-07 DIAGNOSIS — R262 Difficulty in walking, not elsewhere classified: Secondary | ICD-10-CM | POA: Diagnosis not present

## 2015-12-07 NOTE — Therapy (Signed)
[place] MAIN [facility] SERVICES [address] [place], [place], [address] Phone: [phone]   Fax:  [phone]  Physical Therapy Treatment  Patient Details  Name: Karen Bowman MRN: [id number] Date of Birth: 1940/07/10 No Data Recorded  Encounter Date: 12/07/2015      PT End of Session - 12/07/15 1507    Visit Number 11   Number of Visits 17   Date for PT Re-Evaluation 11/28/15   Authorization Type 1/10g codes   Authorization Time Period 1/10   Equipment Utilized During Treatment Gait belt   Activity Tolerance Patient tolerated treatment well   Behavior During Therapy [facility] for tasks assessed/performed      Past Medical History:  Diagnosis Date  . Acute heart failure ([place])   . Anxiety   . Arrhythmia   . Arthritis   . Atrial fibrillation ([place])   . Heart murmur   . Hyperlipemia   . Hypertension   . Stroke [facility])     Past Surgical History:  Procedure Laterality Date  . ABDOMINAL HYSTERECTOMY    . APPENDECTOMY    . BLADDER SUSPENSION    . ELECTROPHYSIOLOGIC STUDY N/A 10/18/2014   Procedure: CARDIOVERSION;  Surgeon: Thayer Headings, MD;  Location: [facility] ORS;  Service: Cardiovascular;  Laterality: N/A;  . TEE WITHOUT CARDIOVERSION N/A 10/18/2014   Procedure: TRANSESOPHAGEAL ECHOCARDIOGRAM (TEE);  Surgeon: Thayer Headings, MD;  Location: [facility] ORS;  Service: Cardiovascular;  Laterality: N/A;  . TONSILLECTOMY    . TUMOR REMOVAL     right arm    There were no vitals filed for this visit.      Subjective Assessment - 12/07/15 1506    Subjective Patient reports that she is feeling weak today and having a difficult time getting her house work and chores done. She feels fatigued.    Pertinent History CVA on 06/27/15, went to inpatient rehab, now coming to outpatient OT and PT, she now mainly ambulates wiht SPC, a lot of difficulty with stairs   Limitations Standing;Walking;House hold activities   How long can you stand comfortably? 1.5  hours   How long can you walk comfortably? limited community distances with [facility]   Patient Stated Goals to strengthen legs, walk better, go up and down stairs   Currently in Pain? No/denies   Pain Onset More than a month ago     Therapeutic exercise;  TM walking x . 7 miles / hour leg press x 100 lbs Mini squat 2x10 Standing hip abd 2x10 Standing ankle PF/ DF 2x10 LAQ 2x10 Pt requires mod verbal and tactile cues for proper exercise performance.                           PT Education - 12/07/15 1507    Education provided Yes   Education Details home instead   Person(s) Educated Patient   Methods Explanation   Comprehension Verbalized understanding             PT Long Term Goals - 12/05/15 1718      PT LONG TERM GOAL #1   Title Pt will score <15 seconds on 5x STS for reduced risk of falls within 8 weeks.   Time 8   Period Weeks   Status Partially Met     PT LONG TERM GOAL #2   Title Pt will score >1.0 m/s on 10 meter walk for community ambulation within 8 weeks with LRAD.  Time 8   Status Partially Met     PT LONG TERM GOAL #3   Title Pt will score <10 sec on TUG for community ambulation within 8 weeks.   Period Weeks   Status Partially Met     PT LONG TERM GOAL #4   Title Pt will score >45/56 on Berg Balance test for reduced risk of falls within 8 weeks.   Time 8   Status On-going               Plan - 12/07/15 1507    Clinical Impression Statement Pt reports discomfort during but overall less joint stiffness and pain following.    Rehab Potential Good   Clinical Impairments Affecting Rehab Potential comorbidities   PT Frequency 2x / week   PT Duration 8 weeks   PT Treatment/Interventions Gait training;Stair training;Therapeutic activities;Therapeutic exercise;Balance training;Neuromuscular re-education;Patient/family education   PT Next Visit Plan LE strengthening and balance training   PT Home Exercise Plan STS, LAQs,  march, hip abd seated with RTB   Consulted and Agree with Plan of Care Patient      Patient will benefit from skilled therapeutic intervention in order to improve the following deficits and impairments:  Decreased balance, Decreased coordination, Decreased endurance, Decreased strength, Difficulty walking  Visit Diagnosis: Muscle weakness (generalized)  Difficulty in walking, not elsewhere classified     Problem List Patient Active Problem List   Diagnosis Date Noted  . Bleeding from varicose veins of lower extremity 10/24/2015  . Cerebrovascular accident (CVA) due to thrombosis of left middle cerebral artery ([place]) 09/11/2015  . Chronic anticoagulation 09/11/2015  . HLD (hyperlipidemia) 09/11/2015  . Muscle spasticity   . Hemiparesis affecting dominant side as late effect of stroke ([place])   . Gait disturbance, post-stroke   . Dyslipidemia   . Chronic systolic congestive heart failure ([place])   . Counseling regarding end of life decision making 02/02/2015  . Atrophic vaginitis 12/23/2014  . Pelvic pressure in female 12/23/2014  . Urethral caruncle 12/23/2014  . Cystocele, midline 12/23/2014  . Bradycardia 12/12/2014  . Junctional rhythm 12/09/2014  . Chronic systolic heart failure ([place]) 10/31/2014  . A-fib ([place]) 10/14/2014  . Irritable bowel syndrome (IBS) 04/09/2011  . POSTMENOPAUSAL STATUS 02/02/2009  . HYPERLIPIDEMIA 12/26/2006  . HYPERTENSION, BENIGN ESSENTIAL 12/26/2006  . ARTHRITIS, RHEUMATOID 12/26/2006   Alanson Puls, PT, DPT [place], [place] Headland S 12/07/2015, 3:11 PM  [place] MAIN [facility] SERVICES [address] [place], [place], [address] Phone: [phone]   Fax:  [phone]  Name: ZYRAH WISWELL MRN: [id number] Date of Birth: 14-Jun-1940

## 2015-12-12 ENCOUNTER — Encounter: Payer: Self-pay | Admitting: Physical Therapy

## 2015-12-12 ENCOUNTER — Ambulatory Visit: Payer: Medicare PPO | Admitting: Physical Therapy

## 2015-12-12 ENCOUNTER — Ambulatory Visit: Payer: Medicare PPO | Admitting: Occupational Therapy

## 2015-12-12 DIAGNOSIS — M6281 Muscle weakness (generalized): Secondary | ICD-10-CM

## 2015-12-12 DIAGNOSIS — R278 Other lack of coordination: Secondary | ICD-10-CM | POA: Diagnosis not present

## 2015-12-12 DIAGNOSIS — R262 Difficulty in walking, not elsewhere classified: Secondary | ICD-10-CM

## 2015-12-12 NOTE — Patient Instructions (Signed)
OT TREATMENT    Measurements were obtained, and goals were reviewed with pt.  Neuro muscular re-education:  Pt. performed [facility] tasks using the Grooved pegboard. Pt. worked on grasping the grooved pegs from a horizontal position, and moving the pegs to a vertical position in the hand to prepare for placing them in the grooved slot. Pt. Worked on grasping, and storing objects in the hand. Pt. Requires increased time to complete translatory movements of the hand in preparation for using during ADLs.

## 2015-12-12 NOTE — Therapy (Addendum)
[place] MAIN [facility] SERVICES [address] [place], [place], [address] Phone: [phone]   Fax:  [phone]  Occupational Therapy Treatment/Discharge Summary  Patient Details  Name: Karen Bowman MRN: [id number] Date of Birth: January 21, 1941 Referring Provider: Naaman Plummer  Encounter Date: 12/12/2015      OT End of Session - 12/12/15 1654    Visit Number 14   Number of Visits 24   Date for OT Re-Evaluation 12/20/15   Authorization Type 4 Medicare G code of 10   OT Start Time 6962   OT Stop Time 1553   OT Time Calculation (min) 38 min   Activity Tolerance Patient tolerated treatment well   Behavior During Therapy [facility] for tasks assessed/performed      Past Medical History:  Diagnosis Date  . Acute heart failure ([place])   . Anxiety   . Arrhythmia   . Arthritis   . Atrial fibrillation ([place])   . Heart murmur   . Hyperlipemia   . Hypertension   . Stroke [facility])     Past Surgical History:  Procedure Laterality Date  . ABDOMINAL HYSTERECTOMY    . APPENDECTOMY    . BLADDER SUSPENSION    . ELECTROPHYSIOLOGIC STUDY N/A 10/18/2014   Procedure: CARDIOVERSION;  Surgeon: Thayer Headings, MD;  Location: [facility] ORS;  Service: Cardiovascular;  Laterality: N/A;  . TEE WITHOUT CARDIOVERSION N/A 10/18/2014   Procedure: TRANSESOPHAGEAL ECHOCARDIOGRAM (TEE);  Surgeon: Thayer Headings, MD;  Location: [facility] ORS;  Service: Cardiovascular;  Laterality: N/A;  . TONSILLECTOMY    . TUMOR REMOVAL     right arm    There were no vitals filed for this visit.      Subjective Assessment - 12/12/15 1520    Subjective  Pt. reports feeling like the therapy has helped her hand.   Patient is accompained by: Family member   Pertinent History Pt. is a 75 y.o. female who was admitted to [facility] on 06/28/2015 with a CVA and right sided hemiplegia. Pt. has a history of HTN, chronic systolic congestive heart failure, and A fib. Pt. had inpatient rehab services, as well as  [place] services. Pt. now presents for outpatient rehab services.   Limitations Dominant RUE strength and coordination.   Currently in Pain? No/denies            [facility] OT Assessment - 12/12/15 1522      Coordination   Right 9 Hole Peg Test 26     Strength   Overall Strength Comments Right shoulder flexion, abduction 4/5, elbow flexion, extension 5/5, forearm supination, pronation 4+/5, wrist flexion, extension 4+/5     Hand Function   Right Hand Grip (lbs) 32#   Right Hand Lateral Pinch 14 lbs   Right Hand 3 Point Pinch 10 lbs        OT TREATMENT    Measurements were obtained, and goals were reviewed with pt.  Neuro muscular re-education:  Pt. performed [facility] tasks using the Grooved pegboard. Pt. worked on grasping the grooved pegs from a horizontal position, and moving the pegs to a vertical position in the hand to prepare for placing them in the grooved slot. Pt. Worked on grasping, and storing objects in the hand. Pt. Requires increased time to complete translatory movements of the hand in preparation for using during ADLs.                           OT  Long Term Goals - December 22, 2015 1534      OT LONG TERM GOAL #1   Title Pt. will increase AROM in shoulder flexion by 10 degrees to be able to reach into kitchen cabinetry.   Baseline Shoulder flexion: 106(128)   Period Weeks   Status Achieved     OT LONG TERM GOAL #2   Title Pt. will increase right grip strength by 10# to be able to open jars.   Baseline 13#, Pt. has difficulty oopening jars.   Time 12   Period Weeks   Status Achieved     OT LONG TERM GOAL #3   Title Pt. will improve right lateral pinch strength by 3# to be able to cut meat and veggies efficiently in preparation for a meal.   Baseline 9#, Pt. requires increased time to complete with difficulty.   Time 12   Period Weeks   Status Achieved     OT LONG TERM GOAL #4   Title Pt. improve right FMC by 5 sec. of speed on the 9-hole peg  test to be able to fasten a bra.   Baseline 38 sec. Pt. has difficulty fastening a bra.   Time 12   Period Weeks   Status Achieved     OT LONG TERM GOAL #5   Title Pt. will be able to write 3 sentences efficiently with 100% legibility.   Baseline 50% legibilty   Time 12   Period Weeks   Status Achieved     OT LONG TERM GOAL #6   Title Pt. improve RUE strength to be able to independently lift, hold, and pour from a tea kettle.   Baseline Pt. is unable    Time 12   Period Weeks   Status Partially Met     OT LONG TERM GOAL #7   Title Pt. will independently be able to put small earrings with small plastic backings in standing up.   Baseline Pt. has difficulty in sitting.   Time 12   Period Weeks   Status Achieved               Plan - 12-22-2015 1654    Clinical Impression Statement Pt. has made execellent progress with BUE strength and right hand functioning for use during ADL tasks. Pt. has met most goals, and is now able to complete ADL and IADL  tasks with her LUE and is appropriate for discharge from OT services.   Rehab Potential Good   Clinical Impairments Affecting Rehab Potential Positive Indicators: age, family support, and motivation. Negative Indicators: multiple comorbidities.   OT Frequency 2x / week   OT Duration 12 weeks   OT Treatment/Interventions Self-care/ADL training;Therapeutic exercise;Therapeutic exercises;Patient/family education;Therapeutic activities;DME and/or AE instruction;Passive range of motion;Functional Mobility Training;Moist Heat;Ultrasound;Manual Therapy;Electrical Stimulation   Consulted and Agree with Plan of Care Patient      Patient will benefit from skilled therapeutic intervention in order to improve the following deficits and impairments:  Pain, Decreased activity tolerance, Decreased balance, Impaired UE functional use, Impaired tone, Decreased coordination, Decreased strength, Decreased range of motion, Decreased knowledge of use  of DME  Visit Diagnosis: Muscle weakness (generalized)  Other lack of coordination      G-Codes - 2015-12-22 1542    Functional Assessment Tool Used Clinical judgement based on pt. current functional status, 9 hole peg test, MMT, ROM, dynamometer, pinch gauge.   Functional Limitation Self care   Self Care Current Status (A3855) At least 1 percent but less than  20 percent impaired, limited or restricted   Self Care Goal Status (O3606) At least 20 percent but less than 40 percent impaired, limited or restricted   Self Care Discharge Status [phone]) At least 1 percent but less than 20 percent impaired, limited or restricted      Problem List Patient Active Problem List   Diagnosis Date Noted  . Bleeding from varicose veins of lower extremity 10/24/2015  . Cerebrovascular accident (CVA) due to thrombosis of left middle cerebral artery ([place]) 09/11/2015  . Chronic anticoagulation 09/11/2015  . HLD (hyperlipidemia) 09/11/2015  . Muscle spasticity   . Hemiparesis affecting dominant side as late effect of stroke ([place])   . Gait disturbance, post-stroke   . Dyslipidemia   . Chronic systolic congestive heart failure ([place])   . Counseling regarding end of life decision making 02/02/2015  . Atrophic vaginitis 12/23/2014  . Pelvic pressure in female 12/23/2014  . Urethral caruncle 12/23/2014  . Cystocele, midline 12/23/2014  . Bradycardia 12/12/2014  . Junctional rhythm 12/09/2014  . Chronic systolic heart failure ([place]) 10/31/2014  . A-fib ([place]) 10/14/2014  . Irritable bowel syndrome (IBS) 04/09/2011  . POSTMENOPAUSAL STATUS 02/02/2009  . HYPERLIPIDEMIA 12/26/2006  . HYPERTENSION, BENIGN ESSENTIAL 12/26/2006  . ARTHRITIS, RHEUMATOID 12/26/2006    Harrel Carina, MS, OTR/L 12/12/2015, 5:26 PM  [place] MAIN [facility] SERVICES [address] [place], [place], [address] Phone: [phone]   Fax:  [phone]  Name: ERABELLA KUIPERS MRN:  [id number] Date of Birth: Jun 16, 1940

## 2015-12-12 NOTE — Therapy (Signed)
[place] MAIN [facility] SERVICES [address] [place], [place], [address] Phone: [phone]   Fax:  [phone]  Physical Therapy Treatment  Patient Details  Name: Karen Bowman MRN: [id number] Date of Birth: 07-10-1940 No Data Recorded  Encounter Date: 12/12/2015      PT End of Session - 12/12/15 1507    Visit Number 12   Number of Visits 17   Date for PT Re-Evaluation 11/28/15   Authorization Type 2/10   PT Start Time 0245   PT Stop Time 0315   PT Time Calculation (min) 30 min      Past Medical History:  Diagnosis Date  . Acute heart failure ([place])   . Anxiety   . Arrhythmia   . Arthritis   . Atrial fibrillation ([place])   . Heart murmur   . Hyperlipemia   . Hypertension   . Stroke [facility])     Past Surgical History:  Procedure Laterality Date  . ABDOMINAL HYSTERECTOMY    . APPENDECTOMY    . BLADDER SUSPENSION    . ELECTROPHYSIOLOGIC STUDY N/A 10/18/2014   Procedure: CARDIOVERSION;  Surgeon: Thayer Headings, MD;  Location: [facility] ORS;  Service: Cardiovascular;  Laterality: N/A;  . TEE WITHOUT CARDIOVERSION N/A 10/18/2014   Procedure: TRANSESOPHAGEAL ECHOCARDIOGRAM (TEE);  Surgeon: Thayer Headings, MD;  Location: [facility] ORS;  Service: Cardiovascular;  Laterality: N/A;  . TONSILLECTOMY    . TUMOR REMOVAL     right arm    There were no vitals filed for this visit.      Subjective Assessment - 12/12/15 1455    Subjective Patient says that she wakes up and feels stiff and doesnt fee better until the afternoon.    Pertinent History CVA on 06/27/15, went to inpatient rehab, now coming to outpatient OT and PT, she now mainly ambulates wiht SPC, a lot of difficulty with stairs   Limitations Standing;Walking;House hold activities   How long can you stand comfortably? 1.5 hours   How long can you walk comfortably? limited community distances with [facility]   Patient Stated Goals to strengthen legs, walk better, go up and down stairs   Pain  Onset More than a month ago      OUTCOME MEASURES: TEST  Outcome  Interpretation   5 times sit<>stand  21.63sec  >60 yo, >15 sec indicates increased risk for falls   10 meter walk test     .78          m/s  <1.0 m/s indicates increased risk for falls; limited community ambulator   Timed up and Go     15.99           sec  <14 sec indicates increased risk for falls             Gait training: TAM walking x 10 mins with  BUE support and . 8 miles / hour Side stepping on TM at . 4 miles / hour step ups from floor to 6 inch stool x 20 bilateral marching in parallel bars x 20  Patient needs occasional verbal cueing to improve posture and cueing to correctly perform exercises slowly, holding at end of range to increase motor firing of desired muscle to encourage fatigue.                           PT Education - 12/12/15 1507    Education provided Yes   Education Details  saftey with hand placement with transfers   Person(s) Educated Patient   Methods Explanation   Comprehension Verbalized understanding             PT Long Term Goals - 12/05/15 1718      PT LONG TERM GOAL #1   Title Pt will score <15 seconds on 5x STS for reduced risk of falls within 8 weeks.   Time 8   Period Weeks   Status Partially Met     PT LONG TERM GOAL #2   Title Pt will score >1.0 m/s on 10 meter walk for community ambulation within 8 weeks with LRAD.   Time 8   Status Partially Met     PT LONG TERM GOAL #3   Title Pt will score <10 sec on TUG for community ambulation within 8 weeks.   Period Weeks   Status Partially Met     PT LONG TERM GOAL #4   Title Pt will score >45/56 on Berg Balance test for reduced risk of falls within 8 weeks.   Time 8   Status On-going               Plan - 12/12/15 1518    Clinical Impression Statement Motor control of LE much improved. Muscle fatigue but no major pain complaints.    Rehab Potential Good   Clinical Impairments  Affecting Rehab Potential comorbidities   PT Frequency 2x / week   PT Duration 8 weeks   PT Treatment/Interventions Gait training;Stair training;Therapeutic activities;Therapeutic exercise;Balance training;Neuromuscular re-education;Patient/family education   PT Next Visit Plan LE strengthening and balance training   PT Home Exercise Plan STS, LAQs, march, hip abd seated with RTB   Consulted and Agree with Plan of Care Patient      Patient will benefit from skilled therapeutic intervention in order to improve the following deficits and impairments:  Decreased balance, Decreased coordination, Decreased endurance, Decreased strength, Difficulty walking  Visit Diagnosis: Muscle weakness (generalized)  Difficulty in walking, not elsewhere classified  Other lack of coordination     Problem List Patient Active Problem List   Diagnosis Date Noted  . Bleeding from varicose veins of lower extremity 10/24/2015  . Cerebrovascular accident (CVA) due to thrombosis of left middle cerebral artery ([place]) 09/11/2015  . Chronic anticoagulation 09/11/2015  . HLD (hyperlipidemia) 09/11/2015  . Muscle spasticity   . Hemiparesis affecting dominant side as late effect of stroke ([place])   . Gait disturbance, post-stroke   . Dyslipidemia   . Chronic systolic congestive heart failure ([place])   . Counseling regarding end of life decision making 02/02/2015  . Atrophic vaginitis 12/23/2014  . Pelvic pressure in female 12/23/2014  . Urethral caruncle 12/23/2014  . Cystocele, midline 12/23/2014  . Bradycardia 12/12/2014  . Junctional rhythm 12/09/2014  . Chronic systolic heart failure ([place]) 10/31/2014  . A-fib ([place]) 10/14/2014  . Irritable bowel syndrome (IBS) 04/09/2011  . POSTMENOPAUSAL STATUS 02/02/2009  . HYPERLIPIDEMIA 12/26/2006  . HYPERTENSION, BENIGN ESSENTIAL 12/26/2006  . ARTHRITIS, RHEUMATOID 12/26/2006   Alanson Puls, PT, DPT [place], Minette Headland S 12/12/2015, 3:21 PM  [facility] MAIN [facility] SERVICES [address] [place], [place], [address] Phone: [phone]   Fax:  [phone]  Name: Karen Bowman MRN: [id number] Date of Birth: 1940-05-21

## 2015-12-14 ENCOUNTER — Ambulatory Visit: Payer: Medicare PPO | Admitting: Physical Therapy

## 2015-12-14 ENCOUNTER — Encounter: Payer: Self-pay | Admitting: Physical Therapy

## 2015-12-14 ENCOUNTER — Ambulatory Visit: Payer: Medicare PPO | Admitting: Occupational Therapy

## 2015-12-14 DIAGNOSIS — M6281 Muscle weakness (generalized): Secondary | ICD-10-CM | POA: Diagnosis not present

## 2015-12-14 DIAGNOSIS — R278 Other lack of coordination: Secondary | ICD-10-CM | POA: Diagnosis not present

## 2015-12-14 DIAGNOSIS — R262 Difficulty in walking, not elsewhere classified: Secondary | ICD-10-CM

## 2015-12-14 NOTE — Patient Instructions (Signed)
4 way hip x 15 x 2 sets daily Sit to stand x 10 x 2 sets Heel raises x 15 x 2 sets

## 2015-12-14 NOTE — Therapy (Signed)
[place] MAIN [facility] SERVICES [address] [place], [place], [address] Phone: [phone]   Fax:  [phone]  Physical Therapy Treatment  Patient Details  Name: Karen Bowman MRN: [id number] Date of Birth: 03/22/1941 No Data Recorded  Encounter Date: 12/14/2015      PT End of Session - 12/14/15 1445    Visit Number 13   Number of Visits 17   Date for PT Re-Evaluation 11/28/15   Authorization Type 3/10   PT Start Time 0240   PT Stop Time 0320   PT Time Calculation (min) 40 min   Equipment Utilized During Treatment Gait belt   Activity Tolerance Patient tolerated treatment well   Behavior During Therapy WFL for tasks assessed/performed      Past Medical History:  Diagnosis Date  . Acute heart failure ([place])   . Anxiety   . Arrhythmia   . Arthritis   . Atrial fibrillation ([place])   . Heart murmur   . Hyperlipemia   . Hypertension   . Stroke [facility])     Past Surgical History:  Procedure Laterality Date  . ABDOMINAL HYSTERECTOMY    . APPENDECTOMY    . BLADDER SUSPENSION    . ELECTROPHYSIOLOGIC STUDY N/A 10/18/2014   Procedure: CARDIOVERSION;  Surgeon: Thayer Headings, MD;  Location: [facility] ORS;  Service: Cardiovascular;  Laterality: N/A;  . TEE WITHOUT CARDIOVERSION N/A 10/18/2014   Procedure: TRANSESOPHAGEAL ECHOCARDIOGRAM (TEE);  Surgeon: Thayer Headings, MD;  Location: [facility] ORS;  Service: Cardiovascular;  Laterality: N/A;  . TONSILLECTOMY    . TUMOR REMOVAL     right arm    There were no vitals filed for this visit.      Subjective Assessment - 12/14/15 1443    Subjective Patient says that she wakes up and feels stiff and doesnt fee better until the afternoon. She says that she is getting too old for all this activity.    Pertinent History CVA on 06/27/15, went to inpatient rehab, now coming to outpatient OT and PT, she now mainly ambulates wiht SPC, a lot of difficulty with stairs   Limitations Standing;Walking;House hold  activities   How long can you stand comfortably? 1.5 hours   How long can you walk comfortably? limited community distances with [facility]   Patient Stated Goals to strengthen legs, walk better, go up and down stairs   Pain Onset More than a month ago     Therapeutic exercise: Standing march 2x10 Step ups 6 inch stool x 20   Side steps TM . 3 miles / hour Standing hip abd 2x10 Leg press x 90 lbs x 20 x 3 Patient needs occasional verbal cueing to improve posture and cueing to correctly perform exercises slowly, holding at end of range to increase motor firing of desired muscle to encourage fatigue.                            PT Education - 12/14/15 1445    Education provided Yes   Education Details safety with mobility   Person(s) Educated Patient   Methods Explanation   Comprehension Verbalized understanding             PT Long Term Goals - 12/14/15 1456      PT LONG TERM GOAL #1   Title Pt will score <15 seconds on 5x STS for reduced risk of falls within 8 weeks.   Time 4  Period Weeks   Status On-going     PT LONG TERM GOAL #2   Title Pt will score >1.0 m/s on 10 meter walk for community ambulation within 8 weeks with LRAD.   Time 4   Period Weeks   Status Partially Met     PT LONG TERM GOAL #3   Title Pt will score <10 sec on TUG for community ambulation within 8 weeks.   Time 4   Period Weeks   Status Partially Met     PT LONG TERM GOAL #4   Title Pt will score >45/56 on Berg Balance test for reduced risk of falls within 8 weeks.   Time 4   Period Weeks   Status Partially Met               Plan - 12/14/15 1452    Clinical Impression Statement Pt needed mod cueing when performing side stepping on TM for posture correction and longer steps   Rehab Potential Good   PT Frequency 2x / week   PT Duration 8 weeks   PT Treatment/Interventions Gait training;Stair training;Therapeutic activities;Therapeutic exercise;Balance  training;Neuromuscular re-education;Patient/family education   PT Next Visit Plan LE strengthening and balance training   PT Home Exercise Plan STS, LAQs, march, hip abd seated with RTB      Patient will benefit from skilled therapeutic intervention in order to improve the following deficits and impairments:  Decreased balance, Decreased coordination, Decreased endurance, Decreased strength, Difficulty walking  Visit Diagnosis: Muscle weakness (generalized)  Other lack of coordination  Difficulty in walking, not elsewhere classified     Problem List Patient Active Problem List   Diagnosis Date Noted  . Bleeding from varicose veins of lower extremity 10/24/2015  . Cerebrovascular accident (CVA) due to thrombosis of left middle cerebral artery ([place]) 09/11/2015  . Chronic anticoagulation 09/11/2015  . HLD (hyperlipidemia) 09/11/2015  . Muscle spasticity   . Hemiparesis affecting dominant side as late effect of stroke ([place])   . Gait disturbance, post-stroke   . Dyslipidemia   . Chronic systolic congestive heart failure ([place])   . Counseling regarding end of life decision making 02/02/2015  . Atrophic vaginitis 12/23/2014  . Pelvic pressure in female 12/23/2014  . Urethral caruncle 12/23/2014  . Cystocele, midline 12/23/2014  . Bradycardia 12/12/2014  . Junctional rhythm 12/09/2014  . Chronic systolic heart failure ([place]) 10/31/2014  . A-fib ([place]) 10/14/2014  . Irritable bowel syndrome (IBS) 04/09/2011  . POSTMENOPAUSAL STATUS 02/02/2009  . HYPERLIPIDEMIA 12/26/2006  . HYPERTENSION, BENIGN ESSENTIAL 12/26/2006  . ARTHRITIS, RHEUMATOID 12/26/2006   Alanson Puls, PT, DPT [place], Minette Headland S 12/14/2015, 2:57 PM  [place] MAIN [facility] SERVICES [address] [place], [place], [address] Phone: [phone]   Fax:  [phone]  Name: Karen Bowman MRN: [id number] Date of Birth: 03/21/1941

## 2015-12-18 ENCOUNTER — Ambulatory Visit (INDEPENDENT_AMBULATORY_CARE_PROVIDER_SITE_OTHER): Payer: Medicare PPO | Admitting: Neurology

## 2015-12-18 ENCOUNTER — Encounter: Payer: Self-pay | Admitting: Neurology

## 2015-12-18 VITALS — BP 132/67 | HR 59 | Ht 65.0 in | Wt 132.8 lb

## 2015-12-18 DIAGNOSIS — I10 Essential (primary) hypertension: Secondary | ICD-10-CM

## 2015-12-18 DIAGNOSIS — I480 Paroxysmal atrial fibrillation: Secondary | ICD-10-CM | POA: Diagnosis not present

## 2015-12-18 DIAGNOSIS — I63312 Cerebral infarction due to thrombosis of left middle cerebral artery: Secondary | ICD-10-CM

## 2015-12-18 DIAGNOSIS — Z7901 Long term (current) use of anticoagulants: Secondary | ICD-10-CM | POA: Diagnosis not present

## 2015-12-18 DIAGNOSIS — I83891 Varicose veins of right lower extremities with other complications: Secondary | ICD-10-CM

## 2015-12-18 DIAGNOSIS — E785 Hyperlipidemia, unspecified: Secondary | ICD-10-CM

## 2015-12-18 NOTE — Progress Notes (Signed)
STROKE NEUROLOGY FOLLOW UP NOTE  NAME: Karen Bowman DOB: 1940/06/15  REASON FOR VISIT: stroke follow up HISTORY FROM: pt and chart  Today we had the pleasure of seeing Karen Bowman in follow-up at our Neurology Clinic. Pt was accompanied by no one.   History Summary Karen Bowman is a 75 y.o. female with history of atrial fibrillation on eliquis, hypertension and systolic heart failure was admitted on 06/27/15 for right facial droop and right-sided weakness. She did not receive IV t-PA due to being on Eliquis. MRI showed left BG infarcts, more consistent with small vessel disease. CTA head and neck moderate to severe b/l proximal PCA stenosis. EF 55-60% and LDL 195 and A1C 5.7. Her eliquis was restarted and also ASA 81 was added as well as lipitor 80. She was discharged in good condition.  09/11/15 follow up - the patient has been doing well. On eliquis and ASA without bleeding issues. However, her lipitor was decreased to 40mg  with unclear reason. According to her med list, she is also on 100mg  daily amiodarone. I called her pharmacy and discussed with her pharmacist about the interaction between eliquis and amiodarone but no definite answer was given. Her BP today 141/74 today.   Interval History During the interval time, pt has been doing well from stroke standpoint. However, on 10/17/15, she had right leg varicose vein bleeding, heavy as per pt and she was terrified. She called 911 and was sent to ED. With compression, bleeding stopped and she was advised to continue eliquis. Pt called her cardiology second day, and was taken off ASA but continued eliquis. Pt still very concerned about eliquis without bleeding since then. BP 132/67.   REVIEW OF SYSTEMS: Full 14 system review of systems performed and notable only for those listed below and in HPI above, all others are negative:  Constitutional:   Cardiovascular: leg swelling Ear/Nose/Throat:  Ringing in ears Skin:  Eyes:     Respiratory:   Gastroitestinal:   Genitourinary: incontinence of bladder, frequency of urination Hematology/Lymphatic:  Bruise/bleeding easily Endocrine:  Musculoskeletal:  Neck pain, back pain, joint swelling, neck stiffness Allergy/Immunology:   Neurological:   Psychiatric:  Sleep: restless leg  The following represents the patient's updated allergies and side effects list: Allergies  Allergen Reactions  . Ciprofloxacin     REACTION: dizzy  . Ciprofloxacin Nausea And Vomiting  . Hydrochlorothiazide Nausea And Vomiting  . Hydrochlorothiazide W-Triamterene     REACTION: nausea  . Sulfa Antibiotics Nausea And Vomiting  . Sulfonamide Derivatives     REACTION: nausea \\T \ vomiting  . Valsartan     REACTION: angioedema - probable  . Valsartan Nausea And Vomiting    The neurologically relevant items on the patient's problem list were reviewed on today's visit.  Neurologic Examination  A problem focused neurological exam (12 or more points of the single system neurologic examination, vital signs counts as 1 point, cranial nerves count for 8 points) was performed.  Blood pressure 132/67, pulse (!) 59, height 5\' 5"  (1.651 m), weight 60.2 kg (132 lb 12.8 oz).  General - Well nourished, well developed, in no apparent distress.  Ophthalmologic - Sharp disc margins OU.  Cardiovascular - Regular rate and rhythm.  Mental Status -  Level of arousal and orientation to time, place, and person were intact. Language including expression, naming, repetition, comprehension was assessed and found intact. Fund of Knowledge was assessed and was intact.  Cranial Nerves II - XII - II -  Visual field intact OU. III, IV, VI - Extraocular movements intact. V - Facial sensation intact bilaterally. VII - facial symmetrical. VIII - Hearing & vestibular intact bilaterally. X - Palate elevates symmetrically. XI - Chin turning & shoulder shrug intact bilaterally. XII - Tongue protrusion  intact.  Motor Strength - The patient's strength was normal in all extremities and pronator drift was absent.  Bulk was normal and fasciculations were absent.   Motor Tone - Muscle tone was assessed at the neck and appendages and was normal.  Reflexes - The patient's reflexes were 1+ in all extremities and she had no pathological reflexes.  Sensory - Light touch, temperature/pinprick, vibration and proprioception, and Romberg testing were assessed and were normal.    Coordination - The patient had normal movements in the hands and feet with no ataxia or dysmetria.  Tremor was absent.  Gait and Station - The patient's transfers, posture, gait, station, and turns were observed as normal.    Data reviewed: I personally reviewed the images and agree with the radiology interpretations.  Ct Angio Head and neck W/cm &/or Wo Cm  06/28/2015 IMPRESSION: 1. Moderate to severe bilateral proximal PCA stenoses. 2. Mild right cavernous and supraclinoid ICA stenosis. 3. No cervical carotid or vertebral artery stenosis. Electronically Signed By: Logan Bores M.D. On: 06/28/2015 12:03   Ct Head Wo Contrast  06/27/2015 IMPRESSION: No acute intracranial hemorrhage. Artifact versus less likely a small focal left MCA thrombus. Clinical correlation recommended. CT angiography may provide better evaluation. Electronically Signed: By: Anner Crete M.D. On: 06/27/2015 22:23   Mri and Mra Brain Wo Contrast  06/28/2015 IMPRESSION: 1. Acute left basal ganglia infarct. 2. Mild chronic small vessel ischemic disease and cerebral atrophy. 3. No large vessel occlusion or significant proximal anterior circulation stenosis. 4. Severe right P2 PCA stenosis. Electronically Signed By: Logan Bores M.D. On: 06/28/2015 11:15   2D echo - - Left ventricle: The cavity size was normal. Wall thickness was  increased in a pattern of mild LVH. Systolic function was normal.  The estimated ejection fraction was in the  range of 55% to 60%.  Wall motion was normal; there were no regional wall motion  abnormalities. - Mitral valve: Moderately calcified annulus. Moderately thickened,  moderately calcified leaflets . - Atrial septum: No defect or patent foramen ovale was identified.   Component     Latest Ref Rng 06/28/2015  Cholesterol     0 - 200 mg/dL 276 (H)  Triglycerides     <150 mg/dL 57  HDL Cholesterol     >40 mg/dL 70  Total CHOL/HDL Ratio      3.9  VLDL     0 - 40 mg/dL 11  LDL (calc)     0 - 99 mg/dL 195 (H)  Hemoglobin A1C     4.8 - 5.6 % 5.7 (H)  Mean Plasma Glucose      117     Assessment: As you may recall, she is a 75 y.o. Caucasian female with PMH of atrial fibrillation on eliquis, hypertension and systolic heart failure was admitted on 06/27/15 for left BG infarcts, more consistent with small vessel disease. CTA head and neck moderate to severe b/l proximal PCA stenosis. EF 55-60% and LDL 195 and A1C 5.7. Her eliquis was continued and also ASA 81 was added as well as lipitor. She is also on 100mg  daily amiodarone. She had episode of right leg varicose vein bleeding and ASA was stopped. Not back to drive yet.  Plan:  - continue eliquis and lipitor for stroke prevention - follow up with cardiology for afib management  - Follow up with your primary care physician for stroke risk factor modification. Recommend maintain blood pressure goal <130/80, diabetes with hemoglobin A1c goal below 6.5% and lipids with LDL cholesterol goal below 70 mg/dL.  - check BP at home and record - No restriction for driving, but recommend to drive with family members on board for 2-3 times initially. If both parties feel comfortable of your driving, you can drive alone after. However, you are recommended to drive during the day not at night, no long distance and drive in familiar roads. - bleeding precautions  - healthy diet and regular exercise.  - follow up in 6 months  I spent more than 25  minutes of face to face time with the patient. Greater than 50% of time was spent in counseling and coordination of care. We discussed about bleeding precautions, risk and benefit of eliquis use and driving precautions.    No orders of the defined types were placed in this encounter.   No orders of the defined types were placed in this encounter.   Patient Instructions  - continue eliquis and lipitor for stroke prevention - follow up with cardiology for afib management  - Follow up with your primary care physician for stroke risk factor modification. Recommend maintain blood pressure goal <130/80, diabetes with hemoglobin A1c goal below 6.5% and lipids with LDL cholesterol goal below 70 mg/dL.  - check BP at home and record - No restriction for driving, but recommend to drive with family members on board for 2-3 times initially. If both parties feel comfortable of your driving, you can drive alone after. However, you are recommended to drive during the day not at night, no long distance and drive in familiar roads. - bleeding precautions  - healthy diet and regular exercise.  - follow up in 6 months   Rosalin Hawking, MD PhD [facility] [address], [place], [place] [address] [phone]

## 2015-12-18 NOTE — Patient Instructions (Addendum)
-   continue eliquis and lipitor for stroke prevention - follow up with cardiology for afib management  - Follow up with your primary care physician for stroke risk factor modification. Recommend maintain blood pressure goal <130/80, diabetes with hemoglobin A1c goal below 6.5% and lipids with LDL cholesterol goal below 70 mg/dL.  - check BP at home and record - No restriction for driving, but recommend to drive with family members on board for 2-3 times initially. If both parties feel comfortable of your driving, you can drive alone after. However, you are recommended to drive during the day not at night, no long distance and drive in familiar roads. - bleeding precautions  - healthy diet and regular exercise.  - follow up in 6 months

## 2015-12-19 ENCOUNTER — Ambulatory Visit: Payer: Medicare PPO | Admitting: Physical Therapy

## 2015-12-19 ENCOUNTER — Ambulatory Visit: Payer: Medicare PPO | Admitting: Occupational Therapy

## 2015-12-21 ENCOUNTER — Ambulatory Visit: Payer: Medicare PPO | Admitting: Occupational Therapy

## 2015-12-21 ENCOUNTER — Ambulatory Visit: Payer: Medicare PPO | Admitting: Physical Therapy

## 2015-12-25 ENCOUNTER — Ambulatory Visit (INDEPENDENT_AMBULATORY_CARE_PROVIDER_SITE_OTHER): Payer: Medicare PPO | Admitting: Urology

## 2015-12-25 ENCOUNTER — Encounter: Payer: Self-pay | Admitting: Urology

## 2015-12-25 VITALS — BP 127/76 | HR 71 | Ht 65.0 in | Wt 130.4 lb

## 2015-12-25 DIAGNOSIS — N9489 Other specified conditions associated with female genital organs and menstrual cycle: Secondary | ICD-10-CM

## 2015-12-25 DIAGNOSIS — N8111 Cystocele, midline: Secondary | ICD-10-CM | POA: Diagnosis not present

## 2015-12-25 DIAGNOSIS — R102 Pelvic and perineal pain: Secondary | ICD-10-CM

## 2015-12-25 DIAGNOSIS — N952 Postmenopausal atrophic vaginitis: Secondary | ICD-10-CM | POA: Diagnosis not present

## 2015-12-25 MED ORDER — PRASTERONE 6.5 MG VA INST: 1.0000 | VAGINAL_INSERT | Freq: Every day | VAGINAL | 12 refills | Status: DC

## 2015-12-25 NOTE — Progress Notes (Signed)
12/25/2015 2:04 PM   Karen Bowman September 07, 1940 [id number]  Referring provider: Jinny Sanders, MD [address] [place], [place] [address]  Chief Complaint  Patient presents with  . Follow-up    bladder prolapse     HPI: Patient is a 75 year old Caucasian female who presents today for a yearly follow up for vaginal atrophy, cystocele and pelvic pressure.  Patient suffered a stroke in March of 2017 and has not been on the vaginal estrogen cream since that time.  Her symptoms have returned somewhat with frequency, urgency and incontinence.  She has spontaneous leakage from time to time wetting through her clothes.  She does not wear a pad.    She denies dysuria, gross hematuria and suprapubic pain.  She also denies fevers, chills, nausea and vomiting.  She has not felt a bulge in her vagina.     PMH: Past Medical History:  Diagnosis Date  . Acute heart failure ([place])   . Anxiety   . Arrhythmia   . Arthritis   . Atrial fibrillation ([place])   . Heart murmur   . Hyperlipemia   . Hypertension   . Stroke [facility])     Surgical History: Past Surgical History:  Procedure Laterality Date  . ABDOMINAL HYSTERECTOMY    . APPENDECTOMY    . BLADDER SUSPENSION    . ELECTROPHYSIOLOGIC STUDY N/A 10/18/2014   Procedure: CARDIOVERSION;  Surgeon: Thayer Headings, MD;  Location: [facility] ORS;  Service: Cardiovascular;  Laterality: N/A;  . TEE WITHOUT CARDIOVERSION N/A 10/18/2014   Procedure: TRANSESOPHAGEAL ECHOCARDIOGRAM (TEE);  Surgeon: Thayer Headings, MD;  Location: [facility] ORS;  Service: Cardiovascular;  Laterality: N/A;  . TONSILLECTOMY    . TUMOR REMOVAL     right arm    Home Medications:    Medication List       Accurate as of 12/25/15  2:04 PM. Always use your most recent med list.          amiodarone 200 MG tablet Commonly known as:  PACERONE Take 0.5 tablets (100 mg total) by mouth daily.   amLODipine 2.5 MG tablet Commonly known as:  NORVASC Take 2.5 mg by mouth  daily.   atorvastatin 40 MG tablet Commonly known as:  LIPITOR Take 1 tablet (40 mg total) by mouth daily at 6 PM.   Cholecalciferol 1000 units capsule Take 2,000 Units by mouth daily.   CINNAMON PO Take 2 tablets by mouth daily.   CO-Q 10 Omega-3 Fish Oil Caps Take 1 capsule by mouth 2 (two) times daily.   CRANBERRY PO Take 1 tablet by mouth 2 (two) times daily.   ELIQUIS 5 MG Tabs tablet Generic drug:  apixaban TAKE 1 TABLET TWICE DAILY   EYEBRIGHT PO Take 1 tablet by mouth daily.   levocetirizine 5 MG tablet Commonly known as:  XYZAL Take 1 tablet (5 mg total) by mouth every evening.   loratadine 10 MG tablet Commonly known as:  CLARITIN Take 1 tablet (10 mg total) by mouth daily.   metoprolol tartrate 25 MG tablet Commonly known as:  LOPRESSOR Take 0.5 tablets (12.5 mg total) by mouth 2 (two) times daily.   MISC NATURAL PRODUCTS PO Take 1 tablet by mouth daily. Multivitamin-Dr. Talmage Coin   OSTEO BI-FLEX REGULAR STRENGTH PO Take by mouth.   OVER THE COUNTER MEDICATION Take 2 tablets by mouth daily. Omega Q Plus w/resveratrol-Dr. Talmage Coin   Prasterone 6.5 MG Inst Commonly known as:  INTRAROSA Place 1  suppository vaginally daily.   tiZANidine 2 MG tablet Commonly known as:  ZANAFLEX Take 1 tablet (2 mg total) by mouth at bedtime.   UNABLE TO FIND Med Name/cidar vinegar otc   UNABLE TO FIND Med Name: slipppry elm   UNABLE TO FIND Med Name: jarrow bone   UNABLE TO FIND Med Name: Mk-7 artery circulation       Allergies:  Allergies  Allergen Reactions  . Ciprofloxacin     REACTION: dizzy  . Ciprofloxacin Nausea And Vomiting  . Hydrochlorothiazide Nausea And Vomiting  . Hydrochlorothiazide W-Triamterene     REACTION: nausea  . Sulfa Antibiotics Nausea And Vomiting  . Sulfonamide Derivatives     REACTION: nausea \\T \ vomiting  . Valsartan     REACTION: angioedema - probable  . Valsartan Nausea And Vomiting    Family History: Family History   Problem Relation Age of Onset  . Osteoporosis Mother   . Heart disease Mother   . Stroke Mother   . Cancer Father   . Diabetes Maternal Aunt     Social History:  reports that she has never smoked. She has never used smokeless tobacco. She reports that she does not drink alcohol or use drugs.  ROS: UROLOGY Frequent Urination?: Yes Hard to postpone urination?: Yes Burning/pain with urination?: No Get up at night to urinate?: No Leakage of urine?: Yes Urine stream starts and stops?: No Trouble starting stream?: No Do you have to strain to urinate?: No Blood in urine?: No Urinary tract infection?: No Sexually transmitted disease?: No Injury to kidneys or bladder?: No Painful intercourse?: No Weak stream?: No Currently pregnant?: No Vaginal bleeding?: No Last menstrual period?: No  Gastrointestinal Nausea?: No Vomiting?: No Indigestion/heartburn?: No Diarrhea?: No Constipation?: No  Constitutional Fever: No Night sweats?: No Weight loss?: No Fatigue?: No  Skin Skin rash/lesions?: No Itching?: No  Eyes Blurred vision?: No Double vision?: No  Ears/Nose/Throat Sore throat?: No Sinus problems?: Yes  Hematologic/Lymphatic Swollen glands?: No Easy bruising?: No  Cardiovascular Leg swelling?: Yes Chest pain?: No  Respiratory Cough?: No Shortness of breath?: No  Endocrine Excessive thirst?: No  Musculoskeletal Back pain?: Yes Joint pain?: No  Neurological Headaches?: Yes Dizziness?: Yes  Psychologic Depression?: No Anxiety?: No  Physical Exam: BP 127/76   Pulse 71   Ht 5\' 5"  (1.651 m)   Wt 130 lb 6.4 oz (59.1 kg)   LMP  (LMP Unknown)   BMI 21.70 kg/m   Constitutional: Well nourished. Alert and oriented, No acute distress. HEENT: [place] AT, moist mucus membranes. Trachea midline, no masses. Cardiovascular: No clubbing, cyanosis, or edema. Respiratory: Normal respiratory effort, no increased work of breathing. GI: Abdomen is soft, non  tender, non distended, no abdominal masses. Liver and spleen not palpable.  No hernias appreciated.  Stool sample for occult testing is not indicated.   GU: No CVA tenderness.  No bladder fullness or masses.  Atrophic external genitalia, normal pubic hair distribution, no lesions.  Normal urethral meatus, no lesions, no prolapse, no discharge.   No urethral masses, tenderness and/or tenderness. No bladder fullness, tenderness or masses. Pale vagina mucosa, poor estrogen effect, no discharge, no lesions, good pelvic support, no cystocele or rectocele noted.  No cervical motion tenderness.  Uterus is freely mobile and non-fixed.  No adnexal/parametria masses or tenderness noted.  Anus and perineum are without rashes or lesions.    Skin: No rashes, bruises or suspicious lesions. Lymph: No cervical or inguinal adenopathy. Neurologic: Grossly intact, no focal deficits,  moving all 4 extremities. Psychiatric: Normal mood and affect.  Laboratory Data: Lab Results  Component Value Date   WBC 5.0 07/04/2015   HGB 13.4 07/04/2015   HCT 41.2 07/04/2015   MCV 95.4 07/04/2015   PLT 196 07/04/2015    Lab Results  Component Value Date   CREATININE 0.82 10/18/2015    Lab Results  Component Value Date   HGBA1C 5.7 (H) 06/28/2015    Lab Results  Component Value Date   TSH 2.640 10/15/2014       Component Value Date/Time   CHOL 188 10/18/2015 1016   HDL 64.90 10/18/2015 1016   CHOLHDL 3 10/18/2015 1016   VLDL 12.6 10/18/2015 1016   LDLCALC 110 (H) 10/18/2015 1016    Lab Results  Component Value Date   AST 26 10/18/2015   Lab Results  Component Value Date   ALT 26 10/18/2015      Assessment & Plan:    1. Pelvic pressure  - has returned since the stopping of the vaginal estrogen cream  - initiate Intrarosa, one suppository daily  2. Cystocele  - no reoccurrence seen on exam  3. Atrophic vaginitis  - given a prescription for Intrarosa, one vaginal suppository nightly  -  instructed to contact office if prescription is cost prohibitive  - follow up in 3 months  Return in about 3 months (around 03/25/2016) for exam.  These notes generated with voice recognition software. I apologize for typographical errors.  Zara Council, [facility] [address], [place], [place] [address] [phone]

## 2015-12-25 NOTE — Patient Instructions (Signed)
  I have given you a prescription for prasterone [place] [facility]).  If you find the medication too expensive, please call the office at [phone] for an alternative.

## 2015-12-26 ENCOUNTER — Encounter: Payer: Self-pay | Admitting: Physical Therapy

## 2015-12-26 ENCOUNTER — Ambulatory Visit: Payer: Medicare PPO | Admitting: Physical Therapy

## 2015-12-26 ENCOUNTER — Encounter: Payer: Medicare PPO | Admitting: Occupational Therapy

## 2015-12-26 DIAGNOSIS — M6281 Muscle weakness (generalized): Secondary | ICD-10-CM | POA: Diagnosis not present

## 2015-12-26 DIAGNOSIS — R278 Other lack of coordination: Secondary | ICD-10-CM

## 2015-12-26 DIAGNOSIS — R262 Difficulty in walking, not elsewhere classified: Secondary | ICD-10-CM | POA: Diagnosis not present

## 2015-12-26 NOTE — Therapy (Signed)
[place] MAIN [facility] SERVICES [address] [place], [place], [address] Phone: [phone]   Fax:  [phone]  Physical Therapy Treatment  Patient Details  Name: Karen Bowman MRN: [id number] Date of Birth: 29-Jan-1941 No Data Recorded  Encounter Date: 12/26/2015      PT End of Session - 12/26/15 1444    Visit Number 14   Number of Visits 17   Date for PT Re-Evaluation 11/28/15   Authorization Type 3/10   PT Start Time 0240   PT Stop Time 0318   PT Time Calculation (min) 38 min   Equipment Utilized During Treatment Gait belt   Activity Tolerance Patient tolerated treatment well   Behavior During Therapy WFL for tasks assessed/performed      Past Medical History:  Diagnosis Date  . Acute heart failure ([place])   . Anxiety   . Arrhythmia   . Arthritis   . Atrial fibrillation ([place])   . Heart murmur   . Hyperlipemia   . Hypertension   . Stroke [facility])     Past Surgical History:  Procedure Laterality Date  . ABDOMINAL HYSTERECTOMY    . APPENDECTOMY    . BLADDER SUSPENSION    . ELECTROPHYSIOLOGIC STUDY N/A 10/18/2014   Procedure: CARDIOVERSION;  Surgeon: Thayer Headings, MD;  Location: [facility] ORS;  Service: Cardiovascular;  Laterality: N/A;  . TEE WITHOUT CARDIOVERSION N/A 10/18/2014   Procedure: TRANSESOPHAGEAL ECHOCARDIOGRAM (TEE);  Surgeon: Thayer Headings, MD;  Location: [facility] ORS;  Service: Cardiovascular;  Laterality: N/A;  . TONSILLECTOMY    . TUMOR REMOVAL     right arm    There were no vitals filed for this visit.      Subjective Assessment - 12/26/15 1443    Subjective Patient says that she is tired and has a lot of running around to do .   Pertinent History CVA on 06/27/15, went to inpatient rehab, now coming to outpatient OT and PT, she now mainly ambulates wiht SPC, a lot of difficulty with stairs   Limitations Standing;Walking;House hold activities   How long can you stand comfortably? 1.5 hours   How long can you  walk comfortably? limited community distances with [facility]   Patient Stated Goals to strengthen legs, walk better, go up and down stairs   Currently in Pain? No/denies   Pain Score 0-No pain   Pain Onset More than a month ago      Therapeutic exercise;  standing hip abd with YTB x 20  step ups from floor to 6 inch stool x 20 bilateral Eccentric step downs x 10 BLE Heel raises x 10 x 2 TM side-steeping  x 3 minutes Step-ups to 6" step x 10 bilateral; Quantum leg press 100x 10 x 3 Patient needs occasional verbal cueing to improve posture and cueing to correctly perform exercises slowly. Patient has no reports of increased pain.                            PT Education - 12/26/15 1444    Education provided Yes   Education Details safety with mobility    Person(s) Educated Patient   Methods Explanation   Comprehension Verbalized understanding             PT Long Term Goals - 12/14/15 1456      PT LONG TERM GOAL #1   Title Pt will score <15 seconds on 5x STS for reduced  risk of falls within 8 weeks.   Time 4   Period Weeks   Status On-going     PT LONG TERM GOAL #2   Title Pt will score >1.0 m/s on 10 meter walk for community ambulation within 8 weeks with LRAD.   Time 4   Period Weeks   Status Partially Met     PT LONG TERM GOAL #3   Title Pt will score <10 sec on TUG for community ambulation within 8 weeks.   Time 4   Period Weeks   Status Partially Met     PT LONG TERM GOAL #4   Title Pt will score >45/56 on Berg Balance test for reduced risk of falls within 8 weeks.   Time 4   Period Weeks   Status Partially Met               Plan - 12/26/15 1448    Clinical Impression Statement  Fatigue with sit to stand but demonstrating more control, Increase weight for standing exercises. Fatigue still evident with cross trainer and endurance.    Rehab Potential Good   PT Frequency 2x / week   PT Duration 8 weeks   PT Treatment/Interventions  Gait training;Stair training;Therapeutic activities;Therapeutic exercise;Balance training;Neuromuscular re-education;Patient/family education   PT Next Visit Plan LE strengthening and balance training   PT Home Exercise Plan STS, LAQs, march, hip abd seated with RTB      Patient will benefit from skilled therapeutic intervention in order to improve the following deficits and impairments:  Decreased balance, Decreased coordination, Decreased endurance, Decreased strength, Difficulty walking  Visit Diagnosis: Muscle weakness (generalized)  Other lack of coordination     Problem List Patient Active Problem List   Diagnosis Date Noted  . Bleeding from varicose veins of lower extremity 10/24/2015  . Cerebrovascular accident (CVA) due to thrombosis of left middle cerebral artery ([place]) 09/11/2015  . Chronic anticoagulation 09/11/2015  . HLD (hyperlipidemia) 09/11/2015  . Muscle spasticity   . Hemiparesis affecting dominant side as late effect of stroke ([place])   . Gait disturbance, post-stroke   . Dyslipidemia   . Chronic systolic congestive heart failure ([place])   . Counseling regarding end of life decision making 02/02/2015  . Atrophic vaginitis 12/23/2014  . Pelvic pressure in female 12/23/2014  . Urethral caruncle 12/23/2014  . Cystocele, midline 12/23/2014  . Bradycardia 12/12/2014  . Junctional rhythm 12/09/2014  . Chronic systolic heart failure ([place]) 10/31/2014  . A-fib ([place]) 10/14/2014  . Irritable bowel syndrome (IBS) 04/09/2011  . POSTMENOPAUSAL STATUS 02/02/2009  . HYPERLIPIDEMIA 12/26/2006  . HYPERTENSION, BENIGN ESSENTIAL 12/26/2006  . ARTHRITIS, RHEUMATOID 12/26/2006    Alanson Puls 12/26/2015, 2:51 PM  [place] MAIN [facility] SERVICES [address] [place], [place], [address] Phone: [phone]   Fax:  [phone]  Name: Karen Bowman MRN: [id number] Date of Birth: 02-Apr-1940

## 2015-12-28 ENCOUNTER — Encounter: Payer: Self-pay | Admitting: Physical Therapy

## 2015-12-28 ENCOUNTER — Encounter: Payer: Medicare PPO | Admitting: Occupational Therapy

## 2015-12-28 ENCOUNTER — Ambulatory Visit: Payer: Medicare PPO | Admitting: Physical Therapy

## 2015-12-28 DIAGNOSIS — M6281 Muscle weakness (generalized): Secondary | ICD-10-CM | POA: Diagnosis not present

## 2015-12-28 DIAGNOSIS — R262 Difficulty in walking, not elsewhere classified: Secondary | ICD-10-CM

## 2015-12-28 DIAGNOSIS — R278 Other lack of coordination: Secondary | ICD-10-CM | POA: Diagnosis not present

## 2015-12-28 NOTE — Therapy (Signed)
[place] MAIN [facility] SERVICES [address] [place], [place], [address] Phone: [phone]   Fax:  [phone]  Physical Therapy Treatment  Patient Details  Name: Karen Bowman MRN: [id number] Date of Birth: 1940/06/15 No Data Recorded  Encounter Date: 12/28/2015      PT End of Session - 12/28/15 1508    Visit Number 15   Number of Visits 17   Date for PT Re-Evaluation 11/28/15   Authorization Type 5/10   PT Start Time 0245   PT Stop Time 0315   PT Time Calculation (min) 30 min   Equipment Utilized During Treatment Gait belt   Activity Tolerance Patient tolerated treatment well   Behavior During Therapy WFL for tasks assessed/performed      Past Medical History:  Diagnosis Date  . Acute heart failure ([place])   . Anxiety   . Arrhythmia   . Arthritis   . Atrial fibrillation ([place])   . Heart murmur   . Hyperlipemia   . Hypertension   . Stroke [facility])     Past Surgical History:  Procedure Laterality Date  . ABDOMINAL HYSTERECTOMY    . APPENDECTOMY    . BLADDER SUSPENSION    . ELECTROPHYSIOLOGIC STUDY N/A 10/18/2014   Procedure: CARDIOVERSION;  Surgeon: Thayer Headings, MD;  Location: [facility] ORS;  Service: Cardiovascular;  Laterality: N/A;  . TEE WITHOUT CARDIOVERSION N/A 10/18/2014   Procedure: TRANSESOPHAGEAL ECHOCARDIOGRAM (TEE);  Surgeon: Thayer Headings, MD;  Location: [facility] ORS;  Service: Cardiovascular;  Laterality: N/A;  . TONSILLECTOMY    . TUMOR REMOVAL     right arm    There were no vitals filed for this visit.      Subjective Assessment - 12/28/15 1507    Subjective Patient says that she is tired.   Pertinent History CVA on 06/27/15, went to inpatient rehab, now coming to outpatient OT and PT, she now mainly ambulates wiht SPC, a lot of difficulty with stairs   Limitations Standing;Walking;House hold activities   How long can you stand comfortably? 1.5 hours   How long can you walk comfortably? limited community  distances with [facility]   Patient Stated Goals to strengthen legs, walk better, go up and down stairs   Currently in Pain? No/denies   Pain Score 0-No pain   Pain Onset More than a month ago   Multiple Pain Sites No        Therapeutic exercise and neuromuscular training: 1/2 foam flat side up and balance with head turns left and right feet apart and feet together,   side stepping left and right in parallel bars 10 feet x 3 step ups from floor to 6 inch stool x 20 bilateral marching in parallel bars x 20 Tm walking 1.m/hour x 5 mins TM walking side stepping left and right x . 4 miles / hour Patient needs occasional verbal cueing to improve posture and cueing to correctly perform exercises slowly, holding at end of range to increase motor firing of desired muscle to encourage fatigue.                           PT Education - 12/28/15 1507    Education provided Yes   Education Details safety with steps   Person(s) Educated Patient   Methods Explanation   Comprehension Verbalized understanding             PT Long Term Goals - 12/14/15 1456  PT LONG TERM GOAL #1   Title Pt will score <15 seconds on 5x STS for reduced risk of falls within 8 weeks.   Time 4   Period Weeks   Status On-going     PT LONG TERM GOAL #2   Title Pt will score >1.0 m/s on 10 meter walk for community ambulation within 8 weeks with LRAD.   Time 4   Period Weeks   Status Partially Met     PT LONG TERM GOAL #3   Title Pt will score <10 sec on TUG for community ambulation within 8 weeks.   Time 4   Period Weeks   Status Partially Met     PT LONG TERM GOAL #4   Title Pt will score >45/56 on Berg Balance test for reduced risk of falls within 8 weeks.   Time 4   Period Weeks   Status Partially Met               Plan - 12/28/15 1512    Clinical Impression Statement Muscle fatigue but no major pain complaints. Patient advancing to elevated TM and ascending steps single  leg reciprocal.   Rehab Potential Good   PT Frequency 2x / week   PT Duration 8 weeks   PT Treatment/Interventions Gait training;Stair training;Therapeutic activities;Therapeutic exercise;Balance training;Neuromuscular re-education;Patient/family education   PT Next Visit Plan LE strengthening and balance training   PT Home Exercise Plan STS, LAQs, march, hip abd seated with RTB      Patient will benefit from skilled therapeutic intervention in order to improve the following deficits and impairments:  Decreased balance, Decreased coordination, Decreased endurance, Decreased strength, Difficulty walking  Visit Diagnosis: Muscle weakness (generalized)  Difficulty in walking, not elsewhere classified     Problem List Patient Active Problem List   Diagnosis Date Noted  . Bleeding from varicose veins of lower extremity 10/24/2015  . Cerebrovascular accident (CVA) due to thrombosis of left middle cerebral artery ([place]) 09/11/2015  . Chronic anticoagulation 09/11/2015  . HLD (hyperlipidemia) 09/11/2015  . Muscle spasticity   . Hemiparesis affecting dominant side as late effect of stroke ([place])   . Gait disturbance, post-stroke   . Dyslipidemia   . Chronic systolic congestive heart failure ([place])   . Counseling regarding end of life decision making 02/02/2015  . Atrophic vaginitis 12/23/2014  . Pelvic pressure in female 12/23/2014  . Urethral caruncle 12/23/2014  . Cystocele, midline 12/23/2014  . Bradycardia 12/12/2014  . Junctional rhythm 12/09/2014  . Chronic systolic heart failure ([place]) 10/31/2014  . A-fib ([place]) 10/14/2014  . Irritable bowel syndrome (IBS) 04/09/2011  . POSTMENOPAUSAL STATUS 02/02/2009  . HYPERLIPIDEMIA 12/26/2006  . HYPERTENSION, BENIGN ESSENTIAL 12/26/2006  . ARTHRITIS, RHEUMATOID 12/26/2006    Alanson Puls 12/28/2015, 3:14 PM  [place] MAIN [facility] SERVICES [address] [place], [place], [address] Phone:  [phone]   Fax:  [phone]  Name: Karen Bowman MRN: [id number] Date of Birth: [birth year]

## 2016-01-02 ENCOUNTER — Ambulatory Visit: Payer: Medicare PPO | Attending: Physical Medicine & Rehabilitation | Admitting: Physical Therapy

## 2016-01-02 ENCOUNTER — Encounter: Payer: Self-pay | Admitting: Physical Therapy

## 2016-01-02 DIAGNOSIS — M6281 Muscle weakness (generalized): Secondary | ICD-10-CM | POA: Diagnosis not present

## 2016-01-02 DIAGNOSIS — R262 Difficulty in walking, not elsewhere classified: Secondary | ICD-10-CM | POA: Diagnosis not present

## 2016-01-02 NOTE — Addendum Note (Signed)
Addended by: Alanson Puls on: 01/02/2016 04:07 PM   Modules accepted: Orders

## 2016-01-02 NOTE — Therapy (Signed)
[place] MAIN [facility] SERVICES [address] [place], [place], [address] Phone: [phone]   Fax:  [phone]  Physical Therapy Treatment  Patient Details  Name: Karen Bowman MRN: [id number] Date of Birth: 1940/07/16 No Data Recorded  Encounter Date: 01/02/2016      PT End of Session - 01/02/16 1610    Visit Number 16   Number of Visits 17   Date for PT Re-Evaluation 11/28/15   Authorization Type 6/10   PT Start Time 0400   PT Stop Time 0430   PT Time Calculation (min) 30 min   Equipment Utilized During Treatment Gait belt   Activity Tolerance Patient tolerated treatment well   Behavior During Therapy WFL for tasks assessed/performed      Past Medical History:  Diagnosis Date  . Acute heart failure ([place])   . Anxiety   . Arrhythmia   . Arthritis   . Atrial fibrillation ([place])   . Heart murmur   . Hyperlipemia   . Hypertension   . Stroke [facility])     Past Surgical History:  Procedure Laterality Date  . ABDOMINAL HYSTERECTOMY    . APPENDECTOMY    . BLADDER SUSPENSION    . ELECTROPHYSIOLOGIC STUDY N/A 10/18/2014   Procedure: CARDIOVERSION;  Surgeon: Thayer Headings, MD;  Location: [facility] ORS;  Service: Cardiovascular;  Laterality: N/A;  . TEE WITHOUT CARDIOVERSION N/A 10/18/2014   Procedure: TRANSESOPHAGEAL ECHOCARDIOGRAM (TEE);  Surgeon: Thayer Headings, MD;  Location: [facility] ORS;  Service: Cardiovascular;  Laterality: N/A;  . TONSILLECTOMY    . TUMOR REMOVAL     right arm    There were no vitals filed for this visit.      Subjective Assessment - 01/02/16 1609    Subjective Patient says that she is tired. She did not feel well over the weeknend but is doing fine today.   Pertinent History CVA on 06/27/15, went to inpatient rehab, now coming to outpatient OT and PT, she now mainly ambulates wiht SPC, a lot of difficulty with stairs   Limitations Standing;Walking;House hold activities   How long can you stand comfortably? 1.5  hours   How long can you walk comfortably? limited community distances with [facility]   Patient Stated Goals to strengthen legs, walk better, go up and down stairs   Currently in Pain? No/denies   Pain Score 0-No pain   Pain Onset More than a month ago   Multiple Pain Sites No      Therex: TM x 5 minutes elevation 2 at 1.0 m/sec Leg press 90 lbs x 15 x 3, heel raises x 15 x 3 Standing march 2x10 Fwd/retro walking with 1UE x 3 laps Side steps x 3 laps   Standing balance no UE 5x30s Standing hip abd 2x10 Standing hip ext 2x10 Standing ankle PF/ PF 2x10 LAQ 2x10 Pt requires min verbal and tactile cues for proper exercise performance                           PT Education - 01/02/16 1609    Education provided Yes   Education Details HEP and waking   Person(s) Educated Patient   Methods Explanation   Comprehension Verbalized understanding             PT Long Term Goals - 01/02/16 1612      PT LONG TERM GOAL #1   Title Pt will score <15 seconds on 5x  STS for reduced risk of falls within 8 weeks.   Baseline 21.36 sec   Time 4   Period Weeks   Status On-going     PT LONG TERM GOAL #2   Title Pt will score >1.0 m/s on 10 meter walk for community ambulation within 8 weeks with LRAD.   Baseline 0.69 m/s without AD   Time 4   Period Weeks   Status Partially Met     PT LONG TERM GOAL #3   Title Pt will score <10 sec on TUG for community ambulation within 8 weeks.   Baseline 17.21 sec   Period Weeks   Status Partially Met     PT LONG TERM GOAL #4   Title Pt will score >45/56 on Berg Balance test for reduced risk of falls within 8 weeks.   Baseline 41/56   Time 4   Period Weeks   Status Partially Met               Plan - 01/02/16 1610    Clinical Impression Statement Motor control of LE much improved. Patient is able to perform all exercises without pain behaviors and is able to amublate independently in her home without Ad   Rehab  Potential Good   PT Frequency 2x / week   PT Duration 8 weeks   PT Treatment/Interventions Gait training;Stair training;Therapeutic activities;Therapeutic exercise;Balance training;Neuromuscular re-education;Patient/family education   PT Next Visit Plan LE strengthening and balance training   PT Home Exercise Plan STS, LAQs, march, hip abd seated with RTB      Patient will benefit from skilled therapeutic intervention in order to improve the following deficits and impairments:  Decreased balance, Decreased coordination, Decreased endurance, Decreased strength, Difficulty walking  Visit Diagnosis: Muscle weakness (generalized)  Difficulty in walking, not elsewhere classified     Problem List Patient Active Problem List   Diagnosis Date Noted  . Bleeding from varicose veins of lower extremity 10/24/2015  . Cerebrovascular accident (CVA) due to thrombosis of left middle cerebral artery ([place]) 09/11/2015  . Chronic anticoagulation 09/11/2015  . HLD (hyperlipidemia) 09/11/2015  . Muscle spasticity   . Hemiparesis affecting dominant side as late effect of stroke ([place])   . Gait disturbance, post-stroke   . Dyslipidemia   . Chronic systolic congestive heart failure ([place])   . Counseling regarding end of life decision making 02/02/2015  . Atrophic vaginitis 12/23/2014  . Pelvic pressure in female 12/23/2014  . Urethral caruncle 12/23/2014  . Cystocele, midline 12/23/2014  . Bradycardia 12/12/2014  . Junctional rhythm 12/09/2014  . Chronic systolic heart failure ([place]) 10/31/2014  . A-fib ([place]) 10/14/2014  . Irritable bowel syndrome (IBS) 04/09/2011  . POSTMENOPAUSAL STATUS 02/02/2009  . HYPERLIPIDEMIA 12/26/2006  . HYPERTENSION, BENIGN ESSENTIAL 12/26/2006  . ARTHRITIS, RHEUMATOID 12/26/2006    Alanson Puls 01/02/2016, 4:13 PM  [place] [facility] MAIN [facility] SERVICES [address] [place], [place], [address] Phone: [phone]   Fax:   [phone]  Name: JANE BIRKEL MRN: [id number] Date of Birth: [birth year]

## 2016-01-15 ENCOUNTER — Ambulatory Visit: Payer: Medicare PPO | Admitting: Physical Therapy

## 2016-01-23 ENCOUNTER — Telehealth: Payer: Self-pay | Admitting: Family Medicine

## 2016-01-23 DIAGNOSIS — E782 Mixed hyperlipidemia: Secondary | ICD-10-CM

## 2016-01-23 NOTE — Telephone Encounter (Signed)
-----   Message from Ellamae Sia sent at 01/17/2016 12:48 PM EDT ----- Regarding: Lab orders for Wednesday, 10.25.17 Lab orders for a 3 month follow up appt.

## 2016-01-24 ENCOUNTER — Other Ambulatory Visit (INDEPENDENT_AMBULATORY_CARE_PROVIDER_SITE_OTHER): Payer: Medicare PPO

## 2016-01-24 DIAGNOSIS — E782 Mixed hyperlipidemia: Secondary | ICD-10-CM | POA: Diagnosis not present

## 2016-01-24 LAB — COMPREHENSIVE METABOLIC PANEL
ALBUMIN: 4.1 g/dL (ref 3.5–5.2)
ALT: 21 U/L (ref 0–35)
AST: 23 U/L (ref 0–37)
Alkaline Phosphatase: 52 U/L (ref 39–117)
BILIRUBIN TOTAL: 0.6 mg/dL (ref 0.2–1.2)
BUN: 17 mg/dL (ref 6–23)
CALCIUM: 9.3 mg/dL (ref 8.4–10.5)
CHLORIDE: 106 meq/L (ref 96–112)
CO2: 29 mEq/L (ref 19–32)
CREATININE: 0.74 mg/dL (ref 0.40–1.20)
GFR: 81.7 mL/min (ref >60.00)
Glucose, Bld: 88 mg/dL (ref 70–99)
Potassium: 4.1 mEq/L (ref 3.5–5.1)
Sodium: 142 mEq/L (ref 135–145)
Total Protein: 6.8 g/dL (ref 6.0–8.3)

## 2016-01-24 LAB — LIPID PANEL
CHOLESTEROL: 163 mg/dL (ref 0–200)
HDL: 61.5 mg/dL (ref >39.00)
LDL CALC: 87 mg/dL (ref 0–99)
NonHDL: 101.32
TRIGLYCERIDES: 70 mg/dL (ref 0.0–149.0)
Total CHOL/HDL Ratio: 3
VLDL: 14 mg/dL (ref 0.0–40.0)

## 2016-01-26 ENCOUNTER — Ambulatory Visit: Payer: Medicare PPO | Admitting: Family Medicine

## 2016-01-31 ENCOUNTER — Other Ambulatory Visit: Payer: Self-pay | Admitting: Cardiovascular Disease

## 2016-01-31 ENCOUNTER — Encounter: Payer: Medicare PPO | Admitting: Physical Medicine & Rehabilitation

## 2016-02-29 ENCOUNTER — Ambulatory Visit (INDEPENDENT_AMBULATORY_CARE_PROVIDER_SITE_OTHER): Payer: Medicare PPO | Admitting: Cardiovascular Disease

## 2016-02-29 ENCOUNTER — Encounter: Payer: Self-pay | Admitting: Cardiovascular Disease

## 2016-02-29 VITALS — BP 140/78 | HR 63 | Ht 65.0 in | Wt 131.5 lb

## 2016-02-29 DIAGNOSIS — I480 Paroxysmal atrial fibrillation: Secondary | ICD-10-CM

## 2016-02-29 DIAGNOSIS — E782 Mixed hyperlipidemia: Secondary | ICD-10-CM | POA: Diagnosis not present

## 2016-02-29 DIAGNOSIS — I63312 Cerebral infarction due to thrombosis of left middle cerebral artery: Secondary | ICD-10-CM | POA: Diagnosis not present

## 2016-02-29 NOTE — Patient Instructions (Signed)
Medication Instructions:   No medication changes made   Please take extra metoprolol and amiodarone for breakthrough atrial fibrillation. If it does not go away in few hours, Take an additional amiodarone  Labwork:  No new labs needed  Testing/Procedures:  No further testing at this time   I recommend watching educational videos on topics of interest to you at:       [email or web address]  Enter code: HEARTCARE    Follow-Up: It was a pleasure seeing you in the office today. Please call us if you have new issues that need to be addressed before your next appt.  [phone]  Your physician wants you to follow-up in: 6 months.  You will receive a reminder letter in the mail two months in advance. If you don't receive a letter, please call our office to schedule the follow-up appointment.  If you need a refill on your cardiac medications before your next appointment, please call your pharmacy.

## 2016-02-29 NOTE — Progress Notes (Signed)
Cardiology Office Note  Date:  02/29/2016   ID:  Karen Bowman, DOB [birth year], MRN [id number]  PCP:  [name], MD   Chief Complaint  Patient presents with  . other    6 month f/u no complaints today. Meds reviewed verbally with pt.    HPI:  75 year old woman with history of paroxysmal atrial fibrillation, hospitalization July 5 with discharge 10/20/2014 for atrial fibrillation, discharged on amiodarone, anticoagulation, digoxin, metoprolol.  History of hyperlipidemia, total cholesterol 280  She presents today for follow-up of her atrial fibrillation  stroke March 2017  In follow-up she reports that she is having Rare palpitations,  Typically episodes last couple of hours, Happens when spine acts up, Also when having GI issues Once every few months Will lay there patient may until symptoms resolve  Compliant with her metoprolol and amiodarone  Total chol 163, LDL 87, better cholesterol numbers have been in 2 years  Previous studies below discussed with her in detail today Echo 05/2015: EF 55 to 60%  Neck CT IMPRESSION: 1. Moderate to severe bilateral proximal PCA stenoses. 2. Mild right cavernous and supraclinoid ICA stenosis.  EKG on today's visit shows normal sinus rhythm with rate 63 bpm, no significant ST or T-wave changes  Other past medical hx reviewed Hospital records were reviewed from March 2017, right side deficits upper extremity, lower extremity, speech deficits TPA was not given CT scan of the head and neck documenting diffuse PAD Discharged with eliquis and aspirin 81 mg daily She has completed rehabilitation, has been home for approximately one month, Reports improving deficits, able to floss her teeth, brush her hair,  graduated to a cane from a walker Denies any tachycardia or palpitations concerning for arrhythmia  Prior lab work March 2017 showing total cholesterol 270 She reports being compliant with her Lipitor for the past month  Notes  indicate TEE with cardioversion 10/18/2014 which was unsuccessful   PMH:   has a past medical history of Acute heart failure ([place]); Anxiety; Arrhythmia; Arthritis; Atrial fibrillation ([place]); Heart murmur; Hyperlipemia; Hypertension; and Stroke [facility]).  PSH:    Past Surgical History:  Procedure Laterality Date  . ABDOMINAL HYSTERECTOMY    . APPENDECTOMY    . BLADDER SUSPENSION    . ELECTROPHYSIOLOGIC STUDY N/A 10/18/2014   Procedure: CARDIOVERSION;  Surgeon: Thayer Headings, MD;  Location: [facility] ORS;  Service: Cardiovascular;  Laterality: N/A;  . TEE WITHOUT CARDIOVERSION N/A 10/18/2014   Procedure: TRANSESOPHAGEAL ECHOCARDIOGRAM (TEE);  Surgeon: Thayer Headings, MD;  Location: [facility] ORS;  Service: Cardiovascular;  Laterality: N/A;  . TONSILLECTOMY    . TUMOR REMOVAL     right arm    Current Outpatient Prescriptions  Medication Sig Dispense Refill  . amiodarone (PACERONE) 200 MG tablet TAKE 1 TABLET EVERY DAY 90 tablet 3  . amLODipine (NORVASC) 2.5 MG tablet Take 2.5 mg by mouth daily.    Marland Kitchen atorvastatin (LIPITOR) 40 MG tablet Take 1 tablet (40 mg total) by mouth daily at 6 PM. 90 tablet 3  . Cholecalciferol 1000 UNITS capsule Take 2,000 Units by mouth daily.     Marland Kitchen CINNAMON PO Take 2 tablets by mouth daily.     . Coenzyme Q10-Fish Oil-Vit E (CO-Q 10 OMEGA-3 FISH OIL) CAPS Take 1 capsule by mouth 2 (two) times daily.    Marland Kitchen CRANBERRY PO Take 1 tablet by mouth 2 (two) times daily.     Marland Kitchen ELIQUIS 5 MG TABS tablet TAKE 1 TABLET TWICE DAILY 180 tablet 3  .  EYEBRIGHT PO Take 1 tablet by mouth daily.    . Glucosamine-Chondroitin (OSTEO BI-FLEX REGULAR STRENGTH PO) Take by mouth.    . levocetirizine (XYZAL) 5 MG tablet Take 1 tablet (5 mg total) by mouth every evening. (Patient not taking: Reported on 12/25/2015) 90 tablet 1  . loratadine (CLARITIN) 10 MG tablet Take 1 tablet (10 mg total) by mouth daily. 90 tablet 3  . metoprolol tartrate (LOPRESSOR) 25 MG tablet TAKE 1/2 TABLET TWICE DAILY 90  tablet 3  . MISC NATURAL PRODUCTS PO Take 1 tablet by mouth daily. Multivitamin-Dr. Talmage Coin    . OVER THE COUNTER MEDICATION Take 2 tablets by mouth daily. Omega Q Plus w/resveratrol-Dr. Talmage Coin    . tiZANidine (ZANAFLEX) 2 MG tablet Take 1 tablet (2 mg total) by mouth at bedtime. (Patient not taking: Reported on 12/25/2015) 90 tablet 0  . UNABLE TO FIND Med Name/cidar vinegar otc    . UNABLE TO FIND Med Name: slipppry elm    . UNABLE TO FIND Med Name: jarrow bone    . UNABLE TO FIND Med Name: Mk-7 artery circulation     No current facility-administered medications for this visit.      Allergies:   Ciprofloxacin; Ciprofloxacin; Hydrochlorothiazide; Hydrochlorothiazide w-triamterene; Sulfa antibiotics; Sulfonamide derivatives; Valsartan; and Valsartan   Social History:  The patient  reports that she has never smoked. She has never used smokeless tobacco. She reports that she does not drink alcohol or use drugs.   Family History:   family history includes Cancer in her father; Diabetes in her maternal aunt; Heart disease in her mother; Osteoporosis in her mother; Stroke in her mother.    Review of Systems: Review of Systems  Constitutional: Negative.   Respiratory: Negative.   Cardiovascular: Negative.   Gastrointestinal: Negative.   Musculoskeletal: Negative.   Neurological: Negative.   Psychiatric/Behavioral: Negative.   All other systems reviewed and are negative.    PHYSICAL EXAM: VS:  BP 140/78 (BP Location: Left Arm, Patient Position: Sitting, Cuff Size: Normal)   Pulse 63   Ht 5\' 5"  (1.651 m)   Wt 131 lb 8 oz (59.6 kg)   LMP  (LMP Unknown)   BMI 21.88 kg/m  , BMI Body mass index is 21.88 kg/m. GEN: Well nourished, well developed, in no acute distress  HEENT: normal  Neck: no JVD, carotid bruits, or masses Cardiac: RRR; no murmurs, rubs, or gallops,no edema  Respiratory:  clear to auscultation bilaterally, normal work of breathing GI: soft, nontender, nondistended, +  BS MS: no deformity or atrophy  Skin: warm and dry, no rash Neuro:  Strength and sensation are intact Psych: euthymic mood, full affect   Recent Labs: 07/04/2015: Hemoglobin 13.4; Platelets 196 01/24/2016: ALT 21; BUN 17; Creatinine, Ser 0.74; Potassium 4.1; Sodium 142    Lipid Panel Lab Results  Component Value Date   CHOL 163 01/24/2016   HDL 61.50 01/24/2016   LDLCALC 87 01/24/2016   TRIG 70.0 01/24/2016      Wt Readings from Last 3 Encounters:  02/29/16 131 lb 8 oz (59.6 kg)  12/25/15 130 lb 6.4 oz (59.1 kg)  12/18/15 132 lb 12.8 oz (60.2 kg)       ASSESSMENT AND PLAN:  Paroxysmal atrial fibrillation (HCC) - Plan: EKG 12-Lead Rare episodes of paroxysmal atrial fibrillation On anticoagulation Recommended for any breakthrough episodes that she take extra metoprolol and amiodarone  HYPERLIPIDEMIA Cholesterol close to goal. No medication changes made at this time We will follow up on repeat  lab work Ideally goal LDL should be less than 70 Could add zetia  in follow-up  Cerebrovascular accident (CVA) due to thrombosis of left middle cerebral artery (HCC) Prior history of stroke, paroxysmal atrial fibrillation Cerebrovascular disease Continue aggressive cholesterol management, On anticoagulation   Total encounter time more than 25 minutes  Greater than 50% was spent in counseling and coordination of care with the patient  Disposition:   F/U  6 months   Orders Placed This Encounter  Procedures  . EKG 12-Lead     Signed, Esmond Plants, M.D., Ph.D. 02/29/2016  [place], [place]

## 2016-03-22 ENCOUNTER — Encounter: Payer: Self-pay | Admitting: Family Medicine

## 2016-03-22 ENCOUNTER — Ambulatory Visit (INDEPENDENT_AMBULATORY_CARE_PROVIDER_SITE_OTHER): Payer: Medicare PPO | Admitting: Family Medicine

## 2016-03-22 DIAGNOSIS — I10 Essential (primary) hypertension: Secondary | ICD-10-CM

## 2016-03-22 DIAGNOSIS — I480 Paroxysmal atrial fibrillation: Secondary | ICD-10-CM

## 2016-03-22 DIAGNOSIS — L309 Dermatitis, unspecified: Secondary | ICD-10-CM | POA: Diagnosis not present

## 2016-03-22 DIAGNOSIS — E782 Mixed hyperlipidemia: Secondary | ICD-10-CM

## 2016-03-22 DIAGNOSIS — I5022 Chronic systolic (congestive) heart failure: Secondary | ICD-10-CM

## 2016-03-22 DIAGNOSIS — I69359 Hemiplegia and hemiparesis following cerebral infarction affecting unspecified side: Secondary | ICD-10-CM

## 2016-03-22 NOTE — Progress Notes (Signed)
Subjective:    Patient ID: Karen Bowman, female    DOB: [birth year], 75 y.o.   MRN: [id number]  HPI   75 year old female  With history of HTN and CVA presents fro follow up.   Increase in stress .Marland Kitchen Husband with heart attack in 12/2015. She is doing the driving .  Hypertension:  Well controlled on current  meds. BP Readings from Last 3 Encounters:  03/22/16 124/68  02/29/16 140/78  12/25/15 127/76  Using medication without problems or lightheadedness: none Chest pain with exertion: None Edema: yes Short of breath:None Average home BPs: Other issues:   ParoxsysmalAfib on Eliqus, rate controlled with amiodarone, digoxin and metoprolol CHF,  fairlyeuvolemic on chlorthalidone to use prn swelling.  Last cardiologist appt 02/29/2016 recommended adding zetia if chol not at goal.   Elevated Cholesterol:  LDL almost at goal, much improved on  Statin, could consider zetia per cards note. Lab Results  Component Value Date   CHOL 163 01/24/2016   HDL 61.50 01/24/2016   LDLCALC 87 01/24/2016   LDLDIRECT 206.0 03/08/2013   TRIG 70.0 01/24/2016   CHOLHDL 3 01/24/2016  Using medications without problems: None Muscle aches: None Diet compliance: Good Exercise: walking Body mass index is 21.8 kg/m. Other complaints:  New red rash on neck.. Health food  Store cream has helped some.  Itchy, dry skin.   Review of Systems  Constitutional: Negative for fatigue and fever.  HENT: Negative for ear pain.   Eyes: Negative for pain.  Respiratory: Negative for chest tightness and shortness of breath.   Cardiovascular: Negative for chest pain, palpitations and leg swelling.  Gastrointestinal: Negative for abdominal pain.  Genitourinary: Negative for dysuria.       Objective:   Physical Exam  Constitutional: She is oriented to person, place, and time. Vital signs are normal. She appears well-developed and well-nourished. She is cooperative.  Non-toxic appearance. She does not appear  ill. No distress.  HENT:  Head: Normocephalic.  Right Ear: Hearing, tympanic membrane, external ear and ear canal normal. Tympanic membrane is not erythematous, not retracted and not bulging.  Left Ear: Hearing, tympanic membrane, external ear and ear canal normal. Tympanic membrane is not erythematous, not retracted and not bulging.  Nose: No mucosal edema or rhinorrhea. Right sinus exhibits no maxillary sinus tenderness and no frontal sinus tenderness. Left sinus exhibits no maxillary sinus tenderness and no frontal sinus tenderness.  Mouth/Throat: Uvula is midline, oropharynx is clear and moist and mucous membranes are normal.  Eyes: Conjunctivae, EOM and lids are normal. Pupils are equal, round, and reactive to light. Lids are everted and swept, no foreign bodies found.  Neck: Trachea normal and normal range of motion. Neck supple. Carotid bruit is not present. No thyroid mass and no thyromegaly present.  Cardiovascular: Normal rate, regular rhythm, S1 normal, S2 normal, normal heart sounds, intact distal pulses and normal pulses.  Exam reveals no gallop and no friction rub.   No murmur heard.  VAricose veins bilateral feet. 1+ pitting edema in right leg.    Pulmonary/Chest: Effort normal and breath sounds normal. No tachypnea. No respiratory distress. She has no decreased breath sounds. She has no wheezes. She has no rhonchi. She has no rales.  Abdominal: Soft. Normal appearance and bowel sounds are normal. There is no tenderness.  Neurological: She is alert and oriented to person, place, and time. She has normal strength. She displays no atrophy. A sensory deficit is present. No cranial nerve deficit. Coordination  and gait abnormal.   Improved strength in upper ext and lower ext on right 5/5  Balance remains poor and gait is slowed.  Skin: Skin is warm, dry and intact. No rash noted.     Dry pink patch with  flake at anterior neck  Psychiatric: Her speech is normal and behavior is normal.  Judgment and thought content normal. Her mood appears not anxious. Cognition and memory are normal. She does not exhibit a depressed mood.          Assessment & Plan:

## 2016-03-22 NOTE — Assessment & Plan Note (Signed)
Well controlled. Continue current medication.  

## 2016-03-22 NOTE — Assessment & Plan Note (Signed)
Much better control on statin. Recheck in 6 months. Consider zetia if LDL < 70. Encouraged exercise, weight loss, healthy eating habits.

## 2016-03-22 NOTE — Assessment & Plan Note (Signed)
Treat with topical steroid cream prn.

## 2016-03-22 NOTE — Assessment & Plan Note (Signed)
Followed by cardiology. Note reviewed in detail.

## 2016-03-22 NOTE — Assessment & Plan Note (Signed)
Fairly euvolemic today.. Chronic venous stasis changes on right lower leg.

## 2016-03-22 NOTE — Assessment & Plan Note (Signed)
Improved strength.. Using a cane for ambulation.

## 2016-03-22 NOTE — Patient Instructions (Signed)
Can try topical hydrocortisone cream twice daily x 2 week for rash.. If not improving as expected.. Call.  Continue healthy lifestyle.

## 2016-03-22 NOTE — Progress Notes (Signed)
Pre visit review using our clinic review tool, if applicable. No additional management support is needed unless otherwise documented below in the visit note. 

## 2016-04-03 ENCOUNTER — Encounter: Payer: Self-pay | Admitting: Urology

## 2016-04-03 ENCOUNTER — Ambulatory Visit (INDEPENDENT_AMBULATORY_CARE_PROVIDER_SITE_OTHER): Payer: Medicare PPO | Admitting: Urology

## 2016-04-03 VITALS — BP 125/67 | HR 65 | Ht 65.0 in | Wt 133.0 lb

## 2016-04-03 DIAGNOSIS — R102 Pelvic and perineal pain: Secondary | ICD-10-CM

## 2016-04-03 DIAGNOSIS — N952 Postmenopausal atrophic vaginitis: Secondary | ICD-10-CM | POA: Diagnosis not present

## 2016-04-03 DIAGNOSIS — N8111 Cystocele, midline: Secondary | ICD-10-CM | POA: Diagnosis not present

## 2016-04-03 NOTE — Progress Notes (Signed)
04/03/2016 2:39 PM   Karen Bowman Jul 04, 1940 [id number]  Referring provider: Jinny Sanders, MD [address] [place], [place] [address]  Chief Complaint  Patient presents with  . Follow-up    HPI: Patient is a 76 -year-old Caucasian female with vaginal atrophy, cystocele and pelvic pressure who was initiated on Intrarosa who presents today for a three month follow up.   She did not fill the Intrarosa prescription as it was expensive.    Patient states that she has had urinary incontinence for several years.    Patient has incontinence when she lies on her back during the night.  She is wearing a depends at night.  She is experiencing an occasional  incontinent episodes during the day.    Her incontinence volume is large.     She is having associated urinary frequency, urgency and nocturia.    She does not have a history of urinary tract infections, STI's or injury to the bladder.  She denies dysuria, gross hematuria, suprapubic pain, back pain, abdominal pain or flank pain.   She has not had any recent fevers, chills, nausea or vomiting.   She does not have a history of nephrolithiasis, GU surgery or GU trauma.   She is post menopausal.   She admits to constipation.  She is finding that she has incontinence with constipation.    She is not having pain with bladder filling.  She has not had any recent imaging studies.    She is drinking four to five glasses of water daily.   She is drinking several caffeinated beverages daily.  She is not drinking alcoholic beverages daily.     PMH: Past Medical History:  Diagnosis Date  . Acute heart failure ([place])   . Anxiety   . Arrhythmia   . Arthritis   . Atrial fibrillation ([place])   . Heart murmur   . Hyperlipemia   . Hypertension   . Stroke [facility])     Surgical History: Past Surgical History:  Procedure Laterality Date  . ABDOMINAL HYSTERECTOMY    . APPENDECTOMY    . BLADDER SUSPENSION    . ELECTROPHYSIOLOGIC  STUDY N/A 10/18/2014   Procedure: CARDIOVERSION;  Surgeon: Thayer Headings, MD;  Location: [facility] ORS;  Service: Cardiovascular;  Laterality: N/A;  . TEE WITHOUT CARDIOVERSION N/A 10/18/2014   Procedure: TRANSESOPHAGEAL ECHOCARDIOGRAM (TEE);  Surgeon: Thayer Headings, MD;  Location: [facility] ORS;  Service: Cardiovascular;  Laterality: N/A;  . TONSILLECTOMY    . TUMOR REMOVAL     right arm    Home Medications:  Allergies as of 04/03/2016      Reactions   Ciprofloxacin    REACTION: dizzy   Ciprofloxacin Nausea And Vomiting   Hydrochlorothiazide Nausea And Vomiting   Hydrochlorothiazide W-triamterene    REACTION: nausea   Sulfa Antibiotics Nausea And Vomiting   Sulfonamide Derivatives    REACTION: nausea \\T \ vomiting   Valsartan    REACTION: angioedema - probable   Valsartan Nausea And Vomiting      Medication List       Accurate as of 04/03/16  2:39 PM. Always use your most recent med list.          amiodarone 200 MG tablet Commonly known as:  PACERONE TAKE 1 TABLET EVERY DAY   amLODipine 2.5 MG tablet Commonly known as:  NORVASC Take 2.5 mg by mouth daily.   atorvastatin 40 MG tablet Commonly known as:  LIPITOR Take 1 tablet (  40 mg total) by mouth daily at 6 PM.   Cholecalciferol 1000 units capsule Take 2,000 Units by mouth daily.   CINNAMON PO Take 2 tablets by mouth daily.   CO-Q 10 Omega-3 Fish Oil Caps Take 1 capsule by mouth 2 (two) times daily.   CRANBERRY PO Take 1 tablet by mouth 2 (two) times daily.   ELIQUIS 5 MG Tabs tablet Generic drug:  apixaban TAKE 1 TABLET TWICE DAILY   EYEBRIGHT PO Take 1 tablet by mouth daily.   levocetirizine 5 MG tablet Commonly known as:  XYZAL Take 1 tablet (5 mg total) by mouth every evening.   loratadine 10 MG tablet Commonly known as:  CLARITIN Take 1 tablet (10 mg total) by mouth daily.   metoprolol tartrate 25 MG tablet Commonly known as:  LOPRESSOR TAKE 1/2 TABLET TWICE DAILY   MISC NATURAL PRODUCTS PO Take  1 tablet by mouth daily. Multivitamin-Dr. Talmage Coin   OSTEO BI-FLEX REGULAR STRENGTH PO Take by mouth.   OVER THE COUNTER MEDICATION Take 2 tablets by mouth daily. Omega Q Plus w/resveratrol-Dr. Meda Klinefelter TO FIND Med Name/cidar vinegar otc   UNABLE TO FIND Med Name: slipppry elm   UNABLE TO FIND Med Name: jarrow bone   UNABLE TO FIND Med Name: Mk-7 artery circulation       Allergies:  Allergies  Allergen Reactions  . Ciprofloxacin     REACTION: dizzy  . Ciprofloxacin Nausea And Vomiting  . Hydrochlorothiazide Nausea And Vomiting  . Hydrochlorothiazide W-Triamterene     REACTION: nausea  . Sulfa Antibiotics Nausea And Vomiting  . Sulfonamide Derivatives     REACTION: nausea \\T \ vomiting  . Valsartan     REACTION: angioedema - probable  . Valsartan Nausea And Vomiting    Family History: Family History  Problem Relation Age of Onset  . Osteoporosis Mother   . Heart disease Mother   . Stroke Mother   . Cancer Father   . Diabetes Maternal Aunt     Social History:  reports that she has never smoked. She has never used smokeless tobacco. She reports that she does not drink alcohol or use drugs.  ROS: UROLOGY Frequent Urination?: Yes Hard to postpone urination?: Yes Burning/pain with urination?: No Get up at night to urinate?: Yes Leakage of urine?: Yes Urine stream starts and stops?: No Trouble starting stream?: No Do you have to strain to urinate?: No Blood in urine?: No Urinary tract infection?: No Sexually transmitted disease?: No Injury to kidneys or bladder?: No Painful intercourse?: No Weak stream?: No Currently pregnant?: No Vaginal bleeding?: No Last menstrual period?: n  Gastrointestinal Nausea?: No Vomiting?: No Indigestion/heartburn?: No Diarrhea?: No Constipation?: No  Constitutional Fever: No Night sweats?: No Weight loss?: No Fatigue?: No  Skin Skin rash/lesions?: Yes Itching?: No  Eyes Blurred vision?: No Double  vision?: No  Ears/Nose/Throat Sore throat?: No Sinus problems?: No  Hematologic/Lymphatic Swollen glands?: No Easy bruising?: No  Cardiovascular Leg swelling?: No Chest pain?: No  Respiratory Cough?: No Shortness of breath?: No  Endocrine Excessive thirst?: No  Musculoskeletal Back pain?: Yes Joint pain?: Yes  Neurological Headaches?: No Dizziness?: No  Psychologic Depression?: No Anxiety?: No  Physical Exam: BP 125/67 (BP Location: Left Arm, Patient Position: Sitting, Cuff Size: Normal)   Pulse 65   Ht 5\' 5"  (1.651 m)   Wt 133 lb (60.3 kg)   LMP  (LMP Unknown)   BMI 22.13 kg/m   Constitutional: Well nourished. Alert and oriented, No  acute distress. HEENT: [place] AT, moist mucus membranes. Trachea midline, no masses. Cardiovascular: No clubbing, cyanosis, or edema. Respiratory: Normal respiratory effort, no increased work of breathing. Skin: No rashes, bruises or suspicious lesions. Lymph: No cervical or inguinal adenopathy. Neurologic: Grossly intact, no focal deficits, moving all 4 extremities. Psychiatric: Normal mood and affect.  Laboratory Data: Lab Results  Component Value Date   WBC 5.0 07/04/2015   HGB 13.4 07/04/2015   HCT 41.2 07/04/2015   MCV 95.4 07/04/2015   PLT 196 07/04/2015    Lab Results  Component Value Date   CREATININE 0.74 01/24/2016    Lab Results  Component Value Date   HGBA1C 5.7 (H) 06/28/2015    Lab Results  Component Value Date   TSH 2.640 10/15/2014       Component Value Date/Time   CHOL 163 01/24/2016 1132   HDL 61.50 01/24/2016 1132   CHOLHDL 3 01/24/2016 1132   VLDL 14.0 01/24/2016 1132   LDLCALC 87 01/24/2016 1132    Lab Results  Component Value Date   AST 23 01/24/2016   Lab Results  Component Value Date   ALT 21 01/24/2016    Assessment & Plan:    1. Pelvic pressure  - did not fill Intrarosa, one suppository daily script due to expense  - she has less symptoms when she intake more liquids  and fiber  2. Cystocele  - no SUI  3. Atrophic vaginitis  - given samples of vaginal lubricants for days she is uncomfortable  Return in about 1 year (around 04/03/2017) for exam and OAB questionnaire.  These notes generated with voice recognition software. I apologize for typographical errors.  Karen Bowman, [facility] [address], [place], [place] [address] [phone]

## 2016-05-02 ENCOUNTER — Other Ambulatory Visit: Payer: Self-pay | Admitting: Cardiovascular Disease

## 2016-05-31 ENCOUNTER — Ambulatory Visit (INDEPENDENT_AMBULATORY_CARE_PROVIDER_SITE_OTHER): Payer: Medicare PPO

## 2016-05-31 VITALS — BP 122/70 | HR 51 | Temp 98.3°F | Ht 65.0 in | Wt 130.5 lb

## 2016-05-31 DIAGNOSIS — Z0000 Encounter for general adult medical examination without abnormal findings: Secondary | ICD-10-CM

## 2016-05-31 NOTE — Progress Notes (Signed)
I reviewed health advisor's note, was available for consultation, and agree with documentation and plan.  

## 2016-05-31 NOTE — Patient Instructions (Addendum)
Karen Bowman , Thank you for taking time to come for your Medicare Wellness Visit. I appreciate your ongoing commitment to your health goals. Please review the following plan we discussed and let me know if I can assist you in the future.   These are the goals we discussed: Goals    . safety          Starting 05/31/16, I will continue to use cane or walker as needed to reduce risk of falls.        This is a list of the screening recommended for you and due dates:  Health Maintenance  Topic Date Due  . Flu Shot  05/31/2025*  . Mammogram  05/31/2025*  . DEXA scan (bone density measurement)  05/31/2025*  . Pneumonia vaccines (1 of 2 - PCV13) 05/31/2025*  . Tetanus Vaccine  07/14/2018  . Colon Cancer Screening  08/02/2020  *Topic was postponed. The date shown is not the original due date.   Preventive Care for Adults  A healthy lifestyle and preventive care can promote health and wellness. Preventive health guidelines for adults include the following key practices.  . A routine yearly physical is a good way to check with your health care provider about your health and preventive screening. It is a chance to share any concerns and updates on your health and to receive a thorough exam.  . Visit your dentist for a routine exam and preventive care every 6 months. Brush your teeth twice a day and floss once a day. Good oral hygiene prevents tooth decay and gum disease.  . The frequency of eye exams is based on your age, health, family medical history, use  of contact lenses, and other factors. Follow your health care provider's ecommendations for frequency of eye exams.  . Eat a healthy diet. Foods like vegetables, fruits, whole grains, low-fat dairy products, and lean protein foods contain the nutrients you need without too many calories. Decrease your intake of foods high in solid fats, added sugars, and salt. Eat the right amount of calories for you. Get information about a proper diet from  your health care provider, if necessary.  . Regular physical exercise is one of the most important things you can do for your health. Most adults should get at least 150 minutes of moderate-intensity exercise (any activity that increases your heart rate and causes you to sweat) each week. In addition, most adults need muscle-strengthening exercises on 2 or more days a week.  Silver Sneakers may be a benefit available to you. To determine eligibility, you may visit the website: www.silversneakers.com or contact program at 934-516-8369 Mon-Fri between 8AM-8PM.   . Maintain a healthy weight. The body mass index (BMI) is a screening tool to identify possible weight problems. It provides an estimate of body fat based on height and weight. Your health care provider can find your BMI and can help you achieve or maintain a healthy weight.   For adults 20 years and older: ? A BMI below 18.5 is considered underweight. ? A BMI of 18.5 to 24.9 is normal. ? A BMI of 25 to 29.9 is considered overweight. ? A BMI of 30 and above is considered obese.   . Maintain normal blood lipids and cholesterol levels by exercising and minimizing your intake of saturated fat. Eat a balanced diet with plenty of fruit and vegetables. Blood tests for lipids and cholesterol should begin at age 56 and be repeated every 5 years. If your lipid or cholesterol  levels are high, you are over 50, or you are at high risk for heart disease, you may need your cholesterol levels checked more frequently. Ongoing high lipid and cholesterol levels should be treated with medicines if diet and exercise are not working.  . If you smoke, find out from your health care provider how to quit. If you do not use tobacco, please do not start.  . If you choose to drink alcohol, please do not consume more than 2 drinks per day. One drink is considered to be 12 ounces (355 mL) of beer, 5 ounces (148 mL) of wine, or 1.5 ounces (44 mL) of liquor.  . If you  are 12-3 years old, ask your health care provider if you should take aspirin to prevent strokes.  . Use sunscreen. Apply sunscreen liberally and repeatedly throughout the day. You should seek shade when your shadow is shorter than you. Protect yourself by wearing long sleeves, pants, a wide-brimmed hat, and sunglasses year round, whenever you are outdoors.  . Once a month, do a whole body skin exam, using a mirror to look at the skin on your back. Tell your health care provider of new moles, moles that have irregular borders, moles that are larger than a pencil eraser, or moles that have changed in shape or color.    Fall Prevention in the Home Falls can cause injuries. They can happen to people of all ages. There are many things you can do to make your home safe and to help prevent falls. What can I do on the outside of my home?  Regularly fix the edges of walkways and driveways and fix any cracks.  Remove anything that might make you trip as you walk through a door, such as a raised step or threshold.  Trim any bushes or trees on the path to your home.  Use bright outdoor lighting.  Clear any walking paths of anything that might make someone trip, such as rocks or tools.  Regularly check to see if handrails are loose or broken. Make sure that both sides of any steps have handrails.  Any raised decks and porches should have guardrails on the edges.  Have any leaves, snow, or ice cleared regularly.  Use sand or salt on walking paths during winter.  Clean up any spills in your garage right away. This includes oil or grease spills. What can I do in the bathroom?  Use night lights.  Install grab bars by the toilet and in the tub and shower. Do not use towel bars as grab bars.  Use non-skid mats or decals in the tub or shower.  If you need to sit down in the shower, use a plastic, non-slip stool.  Keep the floor dry. Clean up any water that spills on the floor as soon as it  happens.  Remove soap buildup in the tub or shower regularly.  Attach bath mats securely with double-sided non-slip rug tape.  Do not have throw rugs and other things on the floor that can make you trip. What can I do in the bedroom?  Use night lights.  Make sure that you have a light by your bed that is easy to reach.  Do not use any sheets or blankets that are too big for your bed. They should not hang down onto the floor.  Have a firm chair that has side arms. You can use this for support while you get dressed.  Do not have throw rugs and  other things on the floor that can make you trip. What can I do in the kitchen?  Clean up any spills right away.  Avoid walking on wet floors.  Keep items that you use a lot in easy-to-reach places.  If you need to reach something above you, use a strong step stool that has a grab bar.  Keep electrical cords out of the way.  Do not use floor polish or wax that makes floors slippery. If you must use wax, use non-skid floor wax.  Do not have throw rugs and other things on the floor that can make you trip. What can I do with my stairs?  Do not leave any items on the stairs.  Make sure that there are handrails on both sides of the stairs and use them. Fix handrails that are broken or loose. Make sure that handrails are as long as the stairways.  Check any carpeting to make sure that it is firmly attached to the stairs. Fix any carpet that is loose or worn.  Avoid having throw rugs at the top or bottom of the stairs. If you do have throw rugs, attach them to the floor with carpet tape.  Make sure that you have a light switch at the top of the stairs and the bottom of the stairs. If you do not have them, ask someone to add them for you. What else can I do to help prevent falls?  Wear shoes that:  Do not have high heels.  Have rubber bottoms.  Are comfortable and fit you well.  Are closed at the toe. Do not wear sandals.  If you  use a stepladder:  Make sure that it is fully opened. Do not climb a closed stepladder.  Make sure that both sides of the stepladder are locked into place.  Ask someone to hold it for you, if possible.  Clearly mark and make sure that you can see:  Any grab bars or handrails.  First and last steps.  Where the edge of each step is.  Use tools that help you move around (mobility aids) if they are needed. These include:  Canes.  Walkers.  Scooters.  Crutches.  Turn on the lights when you go into a dark area. Replace any light bulbs as soon as they burn out.  Set up your furniture so you have a clear path. Avoid moving your furniture around.  If any of your floors are uneven, fix them.  If there are any pets around you, be aware of where they are.  Review your medicines with your doctor. Some medicines can make you feel dizzy. This can increase your chance of falling. Ask your doctor what other things that you can do to help prevent falls. This information is not intended to replace advice given to you by your health care provider. Make sure you discuss any questions you have with your health care provider. Document Released: 01/12/2009 Document Revised: 08/24/2015 Document Reviewed: 04/22/2014 Elsevier Interactive Patient Education  2017 [place] Prevention in the Home Falls can cause injuries. They can happen to people of all ages. There are many things you can do to make your home safe and to help prevent falls. What can I do on the outside of my home?  Regularly fix the edges of walkways and driveways and fix any cracks.  Remove anything that might make you trip as you walk through a door, such as a raised step or  threshold.  Trim any bushes or trees on the path to your home.  Use bright outdoor lighting.  Clear any walking paths of anything that might make someone trip, such as rocks or tools.  Regularly check to see if handrails are loose or  broken. Make sure that both sides of any steps have handrails.  Any raised decks and porches should have guardrails on the edges.  Have any leaves, snow, or ice cleared regularly.  Use sand or salt on walking paths during winter.  Clean up any spills in your garage right away. This includes oil or grease spills. What can I do in the bathroom?  Use night lights.  Install grab bars by the toilet and in the tub and shower. Do not use towel bars as grab bars.  Use non-skid mats or decals in the tub or shower.  If you need to sit down in the shower, use a plastic, non-slip stool.  Keep the floor dry. Clean up any water that spills on the floor as soon as it happens.  Remove soap buildup in the tub or shower regularly.  Attach bath mats securely with double-sided non-slip rug tape.  Do not have throw rugs and other things on the floor that can make you trip. What can I do in the bedroom?  Use night lights.  Make sure that you have a light by your bed that is easy to reach.  Do not use any sheets or blankets that are too big for your bed. They should not hang down onto the floor.  Have a firm chair that has side arms. You can use this for support while you get dressed.  Do not have throw rugs and other things on the floor that can make you trip. What can I do in the kitchen?  Clean up any spills right away.  Avoid walking on wet floors.  Keep items that you use a lot in easy-to-reach places.  If you need to reach something above you, use a strong step stool that has a grab bar.  Keep electrical cords out of the way.  Do not use floor polish or wax that makes floors slippery. If you must use wax, use non-skid floor wax.  Do not have throw rugs and other things on the floor that can make you trip. What can I do with my stairs?  Do not leave any items on the stairs.  Make sure that there are handrails on both sides of the stairs and use them. Fix handrails that are  broken or loose. Make sure that handrails are as long as the stairways.  Check any carpeting to make sure that it is firmly attached to the stairs. Fix any carpet that is loose or worn.  Avoid having throw rugs at the top or bottom of the stairs. If you do have throw rugs, attach them to the floor with carpet tape.  Make sure that you have a light switch at the top of the stairs and the bottom of the stairs. If you do not have them, ask someone to add them for you. What else can I do to help prevent falls?  Wear shoes that:  Do not have high heels.  Have rubber bottoms.  Are comfortable and fit you well.  Are closed at the toe. Do not wear sandals.  If you use a stepladder:  Make sure that it is fully opened. Do not climb a closed stepladder.  Make sure that both sides  of the stepladder are locked into place.  Ask someone to hold it for you, if possible.  Clearly mark and make sure that you can see:  Any grab bars or handrails.  First and last steps.  Where the edge of each step is.  Use tools that help you move around (mobility aids) if they are needed. These include:  Canes.  Walkers.  Scooters.  Crutches.  Turn on the lights when you go into a dark area. Replace any light bulbs as soon as they burn out.  Set up your furniture so you have a clear path. Avoid moving your furniture around.  If any of your floors are uneven, fix them.  If there are any pets around you, be aware of where they are.  Review your medicines with your doctor. Some medicines can make you feel dizzy. This can increase your chance of falling. Ask your doctor what other things that you can do to help prevent falls. This information is not intended to replace advice given to you by your health care provider. Make sure you discuss any questions you have with your health care provider. Document Released: 01/12/2009 Document Revised: 08/24/2015 Document Reviewed: 04/22/2014 Elsevier  Interactive Patient Education  2017 [employer].

## 2016-05-31 NOTE — Progress Notes (Signed)
Subjective:   Karen Bowman is a 76 y.o. female who presents for Medicare Annual (Subsequent) preventive examination.  Review of Systems:  N/A Cardiac Risk Factors include: advanced age (>110men, >96 women);dyslipidemia;hypertension;sedentary lifestyle     Objective:     Vitals: BP 122/70 (BP Location: Left Arm, Patient Position: Sitting, Cuff Size: Normal)   Pulse (!) 51   Temp 98.3 F (36.8 C) (Oral)   Ht 5\' 5"  (1.651 m) Comment: no shoes  Wt 130 lb 8 oz (59.2 kg)   LMP  (LMP Unknown)   SpO2 98%   BMI 21.72 kg/m   Body mass index is 21.72 kg/m.   Tobacco History  Smoking Status  . Never Smoker  Smokeless Tobacco  . Never Used     Counseling given: No   Past Medical History:  Diagnosis Date  . Acute heart failure ([place])   . Anxiety   . Arrhythmia   . Arthritis   . Atrial fibrillation ([place])   . Heart murmur   . Hyperlipemia   . Hypertension   . Stroke [facility])    Past Surgical History:  Procedure Laterality Date  . ABDOMINAL HYSTERECTOMY    . APPENDECTOMY    . BLADDER SUSPENSION    . ELECTROPHYSIOLOGIC STUDY N/A 10/18/2014   Procedure: CARDIOVERSION;  Surgeon: Thayer Headings, MD;  Location: [facility] ORS;  Service: Cardiovascular;  Laterality: N/A;  . TEE WITHOUT CARDIOVERSION N/A 10/18/2014   Procedure: TRANSESOPHAGEAL ECHOCARDIOGRAM (TEE);  Surgeon: Thayer Headings, MD;  Location: [facility] ORS;  Service: Cardiovascular;  Laterality: N/A;  . TONSILLECTOMY    . TUMOR REMOVAL     right arm   Family History  Problem Relation Age of Onset  . Osteoporosis Mother   . Heart disease Mother   . Stroke Mother   . Cancer Father   . Diabetes Maternal Aunt    History  Sexual Activity  . Sexual activity: No    Outpatient Encounter Prescriptions as of 05/31/2016  Medication Sig  . amiodarone (PACERONE) 200 MG tablet TAKE 1 TABLET EVERY DAY  . amLODipine (NORVASC) 2.5 MG tablet TAKE 1 TABLET EVERY DAY  . atorvastatin (LIPITOR) 40 MG tablet Take 1 tablet (40 mg  total) by mouth daily at 6 PM.  . Cholecalciferol 1000 UNITS capsule Take 2,000 Units by mouth daily.   Marland Kitchen CINNAMON PO Take 2 tablets by mouth daily.   . Coenzyme Q10-Fish Oil-Vit E (CO-Q 10 OMEGA-3 FISH OIL) CAPS Take 1 capsule by mouth 2 (two) times daily.  Marland Kitchen CRANBERRY PO Take 1 tablet by mouth 2 (two) times daily.   Marland Kitchen ELIQUIS 5 MG TABS tablet TAKE 1 TABLET TWICE DAILY  . EYEBRIGHT PO Take 1 tablet by mouth daily.  . Glucosamine-Chondroitin (OSTEO BI-FLEX REGULAR STRENGTH PO) Take by mouth.  . loratadine (CLARITIN) 10 MG tablet Take 1 tablet (10 mg total) by mouth daily.  . metoprolol tartrate (LOPRESSOR) 25 MG tablet TAKE 1/2 TABLET TWICE DAILY  . MISC NATURAL PRODUCTS PO Take 1 tablet by mouth daily. Multivitamin-Dr. Talmage Coin  . OVER THE COUNTER MEDICATION Take 2 tablets by mouth daily. Omega Q Plus w/resveratrol-Dr. Talmage Coin  . UNABLE TO FIND Med Name/cidar vinegar otc  . UNABLE TO FIND Med Name: slipppry elm  . UNABLE TO FIND Med Name: jarrow bone  . UNABLE TO FIND Med Name: Mk-7 artery circulation  . levocetirizine (XYZAL) 5 MG tablet Take 1 tablet (5 mg total) by mouth every evening. (Patient not taking: Reported on 05/31/2016)  No facility-administered encounter medications on file as of 05/31/2016.     Activities of Daily Living In your present state of health, do you have any difficulty performing the following activities: 05/31/2016 06/29/2015  Hearing? N N  Vision? N N  Difficulty concentrating or making decisions? N N  Walking or climbing stairs? Y N  Dressing or bathing? N N  Doing errands, shopping? N N  Preparing Food and eating ? N -  Using the Toilet? N -  In the past six months, have you accidently leaked urine? Y -  Do you have problems with loss of bowel control? N -  Managing your Medications? N -  Managing your Finances? N -  Housekeeping or managing your Housekeeping? N -  Some recent data might be hidden    Patient Care Team: Jinny Sanders, MD as PCP - General  (Family Medicine) Amy Cletis [place], MD Alisa Graff, FNP as Nurse Practitioner (Cardiology) Minna Merritts, MD as Consulting Physician (Cardiology)    Assessment:     Hearing Screening   125Hz  250Hz  500Hz  1000Hz  2000Hz  3000Hz  4000Hz  6000Hz  8000Hz   Right ear:   0 0 0  0    Left ear:   0 0 0  0      Visual Acuity Screening   Right eye Left eye Both eyes  Without correction: 20/30 20/50 20/30-1  With correction:       Exercise Activities and Dietary recommendations Current Exercise Habits: The patient does not participate in regular exercise at present, Exercise limited by: None identified  Goals    . safety          Starting 05/31/16, I will continue to use cane or walker as needed to reduce risk of falls.       Fall Risk Fall Risk  05/31/2016 12/18/2015 10/02/2015 09/11/2015 03/13/2015  Falls in the past year? Yes Yes No Yes Yes  Number falls in past yr: 2 or more 1 - 2 or more 1  Injury with Fall? Yes No - No Yes  Risk Factor Category  - - - - -  Risk for fall due to : Impaired balance/gait;Impaired mobility - - - History of fall(s)  Follow up Education provided - - - Falls prevention discussed   Depression Screen PHQ 2/9 Scores 05/31/2016 10/02/2015 07/31/2015 03/13/2015  PHQ - 2 Score 0 0 0 0     Cognitive Function MMSE - Mini Mental State Exam 05/31/2016  Orientation to time 5  Orientation to Place 5  Registration 3  Attention/ Calculation 0  Recall 3  Language- name 2 objects 0  Language- repeat 1  Language- follow 3 step command 3  Language- read & follow direction 0  Write a sentence 0  Copy design 0  Total score 20     PLEASE NOTE: A Mini-Cog screen was completed. Maximum score is 20. A value of 0 denotes this part of [place] MMSE was not completed or the patient failed this part of the Mini-Cog screening.   Mini-Cog Screening Orientation to Time - Max 5 pts Orientation to Place - Max 5 pts Registration - Max 3 pts Recall - Max 3 pts Language Repeat - Max 1  pts Language Follow 3 Step Command - Max 3 pts     Immunization History  Administered Date(s) Administered  . Td 07/13/2008   Screening Tests Health Maintenance  Topic Date Due  . INFLUENZA VACCINE  05/31/2025 (Originally 10/31/2015)  . MAMMOGRAM  05/31/2025 (Originally  07/19/2012)  . DEXA SCAN  05/31/2025 (Originally 10/07/2007)  . PNA vac Low Risk Adult (1 of 2 - PCV13) 05/31/2025 (Originally 10/07/2007)  . TETANUS/TDAP  07/14/2018  . COLONOSCOPY  08/02/2020      Plan:     I have personally reviewed and addressed the Medicare Annual Wellness questionnaire and have noted the following in the patient's chart:  A. Medical and social history B. Use of alcohol, tobacco or illicit drugs  C. Current medications and supplements D. Functional ability and status E.  Nutritional status F.  Physical activity G. Advance directives H. List of other physicians I.  Hospitalizations, surgeries, and ER visits in previous 12 months J.  [place] to include hearing, vision, cognitive, depression L. Referrals and appointments - none  In addition, I have reviewed and discussed with patient certain preventive protocols, quality metrics, and best practice recommendations. A written personalized care plan for preventive services as well as general preventive health recommendations were provided to patient.  See attached scanned questionnaire for additional information.   Signed,   Lindell Noe, MHA, BS, LPN Health Coach

## 2016-05-31 NOTE — Progress Notes (Signed)
Pre visit review using our clinic review tool, if applicable. No additional management support is needed unless otherwise documented below in the visit note. 

## 2016-05-31 NOTE — Progress Notes (Signed)
PCP notes:   Health maintenance:  Mammogram - pt declined Bone density - pt declined  Pt declined all vaccinations.  Abnormal screenings:   Hearing - failed  Patient concerns:   Patient reports open wound to right inner ankle region. Onset: July 2017. Patient states she applies Bactroban and a Band-Aid twice daily to wound.  Patient reports concerns with urinary incontinence. Please review responses to questionnaire.  Urinary Incontinence Short Questionnaire 1. How often do you leak urine? Two or three times a week 2. How much urine do you normally leak (whether you wear protection or not)? A small amount A moderate amount 3. Overall, how much does leaking urine interfere with your everyday life?  Please circle a number between 0(not at all) and 10(a great deal). Patient's response: 5 4. When does urine leak?  Leaks before I can get to the toilet Leaks for no obvious reason (when I get up at night)  Nurse concerns:  None  Next PCP appt:   06/20/16 @ 1530

## 2016-06-18 ENCOUNTER — Encounter: Payer: Self-pay | Admitting: Neurology

## 2016-06-18 ENCOUNTER — Ambulatory Visit (INDEPENDENT_AMBULATORY_CARE_PROVIDER_SITE_OTHER): Payer: Medicare PPO | Admitting: Neurology

## 2016-06-18 VITALS — BP 144/80 | HR 55 | Wt 132.0 lb

## 2016-06-18 DIAGNOSIS — Z7901 Long term (current) use of anticoagulants: Secondary | ICD-10-CM | POA: Diagnosis not present

## 2016-06-18 DIAGNOSIS — I480 Paroxysmal atrial fibrillation: Secondary | ICD-10-CM

## 2016-06-18 DIAGNOSIS — I83891 Varicose veins of right lower extremities with other complications: Secondary | ICD-10-CM

## 2016-06-18 DIAGNOSIS — E782 Mixed hyperlipidemia: Secondary | ICD-10-CM | POA: Diagnosis not present

## 2016-06-18 DIAGNOSIS — I6350 Cerebral infarction due to unspecified occlusion or stenosis of unspecified cerebral artery: Secondary | ICD-10-CM | POA: Diagnosis not present

## 2016-06-18 NOTE — Progress Notes (Signed)
STROKE NEUROLOGY FOLLOW UP NOTE  NAME: Karen Bowman DOB: 11/29/1940  REASON FOR VISIT: stroke follow up HISTORY FROM: pt and chart  Today we had the pleasure of seeing Karen Bowman in follow-up at our Neurology Clinic. Pt was accompanied by no one.   History Summary Karen Bowman is a 76 y.o. female with history of atrial fibrillation on eliquis, hypertension and systolic heart failure was admitted on 06/27/15 for right facial droop and right-sided weakness. She did not receive IV t-PA due to being on Eliquis. MRI showed left BG infarcts, more consistent with small vessel disease. CTA head and neck moderate to severe b/l proximal PCA stenosis. EF 55-60% and LDL 195 and A1C 5.7. Her eliquis was restarted and also ASA 81 was added as well as lipitor 80. She was discharged in good condition.  09/11/15 follow up - the patient has been doing well. On eliquis and ASA without bleeding issues. However, her lipitor was decreased to 40mg  with unclear reason. According to her med list, she is also on 100mg  daily amiodarone. I called her pharmacy and discussed with her pharmacist about the interaction between eliquis and amiodarone but no definite answer was given. Her BP today 141/74 today.   12/18/15 follow up - pt has been doing well from stroke standpoint. However, on 10/17/15, she had right leg varicose vein bleeding, heavy as per pt and she was terrified. She called 911 and was sent to ED. With compression, bleeding stopped and she was advised to continue eliquis. Pt called her cardiology second day, and was taken off ASA but continued eliquis. Pt still very concerned about eliquis without bleeding since then. BP 132/67.   Interval History During the interval time, pt has been doing well from stroke standpoint. She is back to driving now as her husband no longer able to drive due to MI in 62/9528. Right leg varicose vein improved, still has a small unhealing wound. Follows with cardiology  Dr. Rockey Situ, no change of medication, still on eliquis. BP stable, today 144/80.  REVIEW OF SYSTEMS: Full 14 system review of systems performed and notable only for those listed below and in HPI above, all others are negative:  Constitutional:   Cardiovascular:  Ear/Nose/Throat:  Ringing in ears, hearing loss Skin:  Eyes:   Respiratory:   Gastroitestinal:   Genitourinary:  Hematology/Lymphatic:  Endocrine:  Musculoskeletal:  Walking difficulty, back pain, joint swelling, neck stiffness Allergy/Immunology:   Neurological:  weakness Psychiatric: agitation Sleep: restless leg  The following represents the patient's updated allergies and side effects list: Allergies  Allergen Reactions  . Ciprofloxacin     REACTION: dizzy  . Ciprofloxacin Nausea And Vomiting  . Hydrochlorothiazide Nausea And Vomiting  . Hydrochlorothiazide W-Triamterene     REACTION: nausea  . Sulfa Antibiotics Nausea And Vomiting  . Sulfonamide Derivatives     REACTION: nausea \\T \ vomiting  . Valsartan     REACTION: angioedema - probable  . Valsartan Nausea And Vomiting    The neurologically relevant items on the patient's problem list were reviewed on today's visit.  Neurologic Examination  A problem focused neurological exam (12 or more points of the single system neurologic examination, vital signs counts as 1 point, cranial nerves count for 8 points) was performed.  Blood pressure (!) 144/80, pulse (!) 55, weight 132 lb (59.9 kg).  General - Well nourished, well developed, in no apparent distress.  Ophthalmologic - Sharp disc margins OU.  Cardiovascular - Regular rate and  rhythm.  Mental Status -  Level of arousal and orientation to time, place, and person were intact. Language including expression, naming, repetition, comprehension was assessed and found intact. Fund of Knowledge was assessed and was intact.  Cranial Nerves II - XII - II - Visual field intact OU. III, IV, VI - Extraocular  movements intact. V - Facial sensation intact bilaterally. VII - facial symmetrical. VIII - Hearing & vestibular intact bilaterally. X - Palate elevates symmetrically. XI - Chin turning & shoulder shrug intact bilaterally. XII - Tongue protrusion intact.  Motor Strength - The patient's strength was normal in all extremities and pronator drift was absent.  Bulk was normal and fasciculations were absent.   Motor Tone - Muscle tone was assessed at the neck and appendages and was normal.  Reflexes - The patient's reflexes were 1+ in all extremities and she had no pathological reflexes.  Sensory - Light touch, temperature/pinprick, vibration and proprioception, and Romberg testing were assessed and were normal.    Coordination - The patient had normal movements in the hands and feet with no ataxia or dysmetria.  Tremor was absent.  Gait and Station - The patient's transfers, posture, gait, station, and turns were observed as normal.    Data reviewed: I personally reviewed the images and agree with the radiology interpretations.  Ct Angio Head and neck W/cm &/or Wo Cm 06/28/2015 IMPRESSION: 1. Moderate to severe bilateral proximal PCA stenoses. 2. Mild right cavernous and supraclinoid ICA stenosis. 3. No cervical carotid or vertebral artery stenosis. Electronically Signed By: Logan Bores M.D. On: 06/28/2015 12:03   Ct Head Wo Contrast 06/27/2015 IMPRESSION: No acute intracranial hemorrhage. Artifact versus less likely a small focal left MCA thrombus. Clinical correlation recommended. CT angiography may provide better evaluation. Electronically Signed: By: Anner Crete M.D. On: 06/27/2015 22:23   Mri and Mra Brain Wo Contrast 06/28/2015 IMPRESSION: 1. Acute left basal ganglia infarct. 2. Mild chronic small vessel ischemic disease and cerebral atrophy. 3. No large vessel occlusion or significant proximal anterior circulation stenosis. 4. Severe right P2 PCA stenosis. Electronically  Signed By: Logan Bores M.D. On: 06/28/2015 11:15   2D echo - - Left ventricle: The cavity size was normal. Wall thickness was  increased in a pattern of mild LVH. Systolic function was normal.  The estimated ejection fraction was in the range of 55% to 60%.  Wall motion was normal; there were no regional wall motion  abnormalities. - Mitral valve: Moderately calcified annulus. Moderately thickened,  moderately calcified leaflets . - Atrial septum: No defect or patent foramen ovale was identified.   Component     Latest Ref Rng 06/28/2015  Cholesterol     0 - 200 mg/dL 276 (H)  Triglycerides     <150 mg/dL 57  HDL Cholesterol     >40 mg/dL 70  Total CHOL/HDL Ratio      3.9  VLDL     0 - 40 mg/dL 11  LDL (calc)     0 - 99 mg/dL 195 (H)  Hemoglobin A1C     4.8 - 5.6 % 5.7 (H)  Mean Plasma Glucose      117    Assessment: As you may recall, she is a 76 y.o. Caucasian female with PMH of atrial fibrillation on eliquis, hypertension and systolic heart failure was admitted on 06/27/15 for left BG infarcts, more consistent with small vessel disease. CTA head and neck moderate to severe b/l proximal PCA stenosis. EF 55-60% and LDL 195 and  A1C 5.7. Her eliquis was continued and also ASA 81 was added as well as lipitor. She is also on 100mg  daily amiodarone. She had episode of right leg varicose vein bleeding and ASA was stopped.   Plan:  - continue eliquis and lipitor for stroke prevention - follow up with cardiology for afib management  - Follow up with your primary care physician for stroke risk factor modification. Recommend maintain blood pressure goal <130/80, diabetes with hemoglobin A1c goal below 6.5% and lipids with LDL cholesterol goal below 70 mg/dL.  - life legs and feet at night to help foot swelling and varicose vein  - check BP at home and record - bleeding precautions  - healthy diet and regular exercise.  - follow up in 6 months with carolyn or Megan, if  stable, can be dc from clinic.  I spent more than 25 minutes of face to face time with the patient. Greater than 50% of time was spent in counseling and coordination of care. We discussed about bleeding precautions, follow up with cardiology and driving precautions.    No orders of the defined types were placed in this encounter.   Meds ordered this encounter  Medications  . UNABLE TO FIND    Sig: Med Name: tumeric/curcuama otc  . UNABLE TO FIND    Sig: Cell defense daily  . UNABLE TO FIND    Sig: Mobilify otc daily    Patient Instructions  - continue eliquis and lipitor for stroke prevention - follow up with cardiology for afib management  - Follow up with your primary care physician for stroke risk factor modification. Recommend maintain blood pressure goal <130/80, diabetes with hemoglobin A1c goal below 6.5% and lipids with LDL cholesterol goal below 70 mg/dL.  - life legs and feet at night to help foot swelling and varicose vein  - check BP at home and record - bleeding precautions  - healthy diet and regular exercise.  - follow up in 6 months   Rosalin Hawking, MD PhD [facility] [address] [place], [place] [address] [phone]

## 2016-06-18 NOTE — Patient Instructions (Addendum)
-   continue eliquis and lipitor for stroke prevention - follow up with cardiology for afib management  - Follow up with your primary care physician for stroke risk factor modification. Recommend maintain blood pressure goal <130/80, diabetes with hemoglobin A1c goal below 6.5% and lipids with LDL cholesterol goal below 70 mg/dL.  - life legs and feet at night to help foot swelling and varicose vein  - check BP at home and record - bleeding precautions  - healthy diet and regular exercise.  - follow up in 6 months

## 2016-06-19 DIAGNOSIS — Z8673 Personal history of transient ischemic attack (TIA), and cerebral infarction without residual deficits: Secondary | ICD-10-CM | POA: Insufficient documentation

## 2016-06-20 ENCOUNTER — Ambulatory Visit (INDEPENDENT_AMBULATORY_CARE_PROVIDER_SITE_OTHER): Payer: Medicare PPO | Admitting: Family Medicine

## 2016-06-20 ENCOUNTER — Encounter: Payer: Self-pay | Admitting: Family Medicine

## 2016-06-20 VITALS — BP 102/60 | HR 70 | Temp 98.4°F | Ht 65.0 in | Wt 131.5 lb

## 2016-06-20 DIAGNOSIS — I83013 Varicose veins of right lower extremity with ulcer of ankle: Secondary | ICD-10-CM

## 2016-06-20 DIAGNOSIS — N3941 Urge incontinence: Secondary | ICD-10-CM | POA: Diagnosis not present

## 2016-06-20 DIAGNOSIS — I69359 Hemiplegia and hemiparesis following cerebral infarction affecting unspecified side: Secondary | ICD-10-CM | POA: Diagnosis not present

## 2016-06-20 DIAGNOSIS — R32 Unspecified urinary incontinence: Secondary | ICD-10-CM | POA: Insufficient documentation

## 2016-06-20 DIAGNOSIS — I480 Paroxysmal atrial fibrillation: Secondary | ICD-10-CM

## 2016-06-20 DIAGNOSIS — I10 Essential (primary) hypertension: Secondary | ICD-10-CM

## 2016-06-20 DIAGNOSIS — I5022 Chronic systolic (congestive) heart failure: Secondary | ICD-10-CM

## 2016-06-20 DIAGNOSIS — L97319 Non-pressure chronic ulcer of right ankle with unspecified severity: Secondary | ICD-10-CM

## 2016-06-20 DIAGNOSIS — E785 Hyperlipidemia, unspecified: Secondary | ICD-10-CM | POA: Diagnosis not present

## 2016-06-20 DIAGNOSIS — Z8673 Personal history of transient ischemic attack (TIA), and cerebral infarction without residual deficits: Secondary | ICD-10-CM

## 2016-06-20 DIAGNOSIS — M069 Rheumatoid arthritis, unspecified: Secondary | ICD-10-CM

## 2016-06-20 DIAGNOSIS — E782 Mixed hyperlipidemia: Secondary | ICD-10-CM

## 2016-06-20 MED ORDER — MUPIROCIN CALCIUM 2 % EX CREA: 1.0000 "application " | TOPICAL_CREAM | Freq: Two times a day (BID) | CUTANEOUS | 0 refills | Status: DC

## 2016-06-20 NOTE — Patient Instructions (Addendum)
Elevated feet whenever lying or sitting.  Apply topical bactroban to wound.. Call if redness spreading or discharge worsening.  Once discharge clear .Marland Kitchen Start compression hose 15-30 mmHG.

## 2016-06-20 NOTE — Progress Notes (Signed)
Pre visit review using our clinic review tool, if applicable. No additional management support is needed unless otherwise documented below in the visit note. 

## 2016-06-20 NOTE — Assessment & Plan Note (Addendum)
?   True diagnosis. No bothering her much.. No interested in treating with meds at this time. Refuses to return to rheumatologist at this time.

## 2016-06-20 NOTE — Assessment & Plan Note (Signed)
Pt not interested in medication to treat at this time.

## 2016-06-20 NOTE — Progress Notes (Signed)
Subjective:    Patient ID: Karen Bowman, female    DOB: 1940-05-02, 76 y.o.   MRN: [id number]  HPI   76 year old female presetns for physical as well as follow up chronic healthy issues.  The patient saw Candis Musa, LPN for medicare wellness. Note reviewed in detail and important notes copied below.  Health maintenance: Mammogram - pt declined Bone density - pt declined  Pt declined all vaccinations.  Abnormal screenings:  Hearing - failed  Patient concerns:  Patient reports open wound to right inner ankle region. Onset: July 2017. Patient states she applies Bactroban and a Band-Aid twice daily to wound.  Patient reports concerns with urinary incontinence. Please review responses to questionnaire. Urinary Incontinence Short Questionnaire 1. How often do you leak urine? Two or three times a week 2. How much urine do you normally leak (whether you wear protection or not)? A small amount A moderate amount 3. Overall, how much does leaking urine interfere with your everyday life?  Please circle a number between 0(not at all) and 10(a great deal). Patient's response: 5 4. When does urine leak?  Leaks before I can get to the toilet Leaks for no obvious reason (when I get up at night)  Nurse concerns: None   TODAY 06/20/16    She has noted ulcer at right medial ankle.Marland Kitchen Has been present since last July.  She has chronic swelling right leg versus left. She is applying neosporin.  no odorous discharge.   Afib, CHF systolic: Moderate, on BBlocker, amlodipine and amiodarone.  On eliquis for anticoagulation. Followed by Dr. Rockey Situ.   CVA with residual weakness on  Right.. improving some over time.  Followed by neuro.   Elevated Cholesterol:  Improved control on moderate dose lipitor. Lab Results  Component Value Date   CHOL 163 01/24/2016   HDL 61.50 01/24/2016   LDLCALC 87 01/24/2016   LDLDIRECT 206.0 03/08/2013   TRIG 70.0 01/24/2016   CHOLHDL 3  01/24/2016  Using medications without problems: none Muscle aches:  none Diet compliance:moderate Exercise: none Other complaints:  RA:  Dx 20  years ago per pt.. No record?  Saw Dr. Jefm Bryant in past. In knees and hips. Stable at this time. On no medication to treat. She tried ? meds.. Refuses. Using  Supplements to treat.  Urethral caruncle followed by [facility].  Social History /Family History/Past Medical History reviewed and updated if needed. Blood pressure 102/60, pulse 70, temperature 98.4 F (36.9 C), temperature source Oral, height 5\' 5"  (1.651 m), weight 131 lb 8 oz (59.6 kg).  Review of Systems  Constitutional: Negative for fatigue and fever.  HENT: Negative for ear pain.   Eyes: Negative for pain.  Respiratory: Negative for chest tightness and shortness of breath.   Cardiovascular: Negative for chest pain, palpitations and leg swelling.  Gastrointestinal: Negative for abdominal pain.  Genitourinary: Negative for difficulty urinating, dysuria, flank pain, hematuria, pelvic pain and vaginal bleeding.       Objective:   Physical Exam  Constitutional: She is oriented to person, place, and time. Vital signs are normal. She appears well-developed and well-nourished. She is cooperative.  Non-toxic appearance. She does not appear ill. No distress.  HENT:  Head: Normocephalic.  Right Ear: Hearing, tympanic membrane, external ear and ear canal normal. Tympanic membrane is not erythematous, not retracted and not bulging.  Left Ear: Hearing, tympanic membrane, external ear and ear canal normal. Tympanic membrane is not erythematous, not retracted and not bulging.  Nose:  No mucosal edema or rhinorrhea. Right sinus exhibits no maxillary sinus tenderness and no frontal sinus tenderness. Left sinus exhibits no maxillary sinus tenderness and no frontal sinus tenderness.  Mouth/Throat: Uvula is midline, oropharynx is clear and moist and mucous membranes are normal.  Eyes: Conjunctivae, EOM and  lids are normal. Pupils are equal, round, and reactive to light. Lids are everted and swept, no foreign bodies found.  Neck: Trachea normal and normal range of motion. Neck supple. Carotid bruit is not present. No thyroid mass and no thyromegaly present.  Cardiovascular: Normal rate, regular rhythm, S1 normal, S2 normal, normal heart sounds, intact distal pulses and normal pulses.  Exam reveals no gallop and no friction rub.   No murmur heard.  VAricose veins bilateral feet. 1+ pitting edema in right leg.    Pulmonary/Chest: Effort normal and breath sounds normal. No tachypnea. No respiratory distress. She has no decreased breath sounds. She has no wheezes. She has no rhonchi. She has no rales.  Abdominal: Soft. Normal appearance and bowel sounds are normal. There is no tenderness.  Neurological: She is alert and oriented to person, place, and time. She has normal strength. She displays no atrophy. A sensory deficit is present. No cranial nerve deficit. Coordination and gait abnormal.   Improved strength in upper ext and lower ext on right 5/5  Balance remains poor and gait is slowed.  Skin: Skin is warm, dry and intact. Lesion noted. No rash noted.  See below  Psychiatric: Her speech is normal and behavior is normal. Judgment and thought content normal. Her mood appears not anxious. Cognition and memory are normal. She does not exhibit a depressed mood.    1 cm x 1.5 cm ulcer right medial ankle with yellowish discharge    Assessment & Plan:  The patient's preventative maintenance and recommended screening tests for an annual wellness exam were reviewed in full today. Brought up to date unless services declined.  Counselled on the importance of diet, exercise, and its role in overall health and mortality. The patient's FH and SH was reviewed, including their home life, tobacco status, and drug and alcohol status.   DEXA done in 2010.Marland Kitchen Not interested in repeating. Vaccines:Due for PNA and  shingles. SE to flu vaccine in past. She is not interested. Due for mammogram,never went last year, not interested in repeating. DVE/pap: not indicated, s/p hysterectomy Colon:never sent back ifob 2014, 2015 or 2016.Marland Kitchen Refused to complete.

## 2016-06-20 NOTE — Assessment & Plan Note (Addendum)
Elevation. Not clearly infected, but small discharge yellow. Will treat with topical bactroban.  Once discharge clear.. Start compression hose.  If not improving in 4 weeks follow up for re-eval and likely referral to wound care center.

## 2016-07-19 ENCOUNTER — Ambulatory Visit (INDEPENDENT_AMBULATORY_CARE_PROVIDER_SITE_OTHER): Payer: Medicare PPO | Admitting: Family Medicine

## 2016-07-19 ENCOUNTER — Encounter: Payer: Self-pay | Admitting: Family Medicine

## 2016-07-19 DIAGNOSIS — L97319 Non-pressure chronic ulcer of right ankle with unspecified severity: Secondary | ICD-10-CM

## 2016-07-19 DIAGNOSIS — I83013 Varicose veins of right lower extremity with ulcer of ankle: Secondary | ICD-10-CM | POA: Diagnosis not present

## 2016-07-19 NOTE — Patient Instructions (Signed)
Start weaning compression hose when up on feet each day. Elevate feet above your heart.  No local infection.  Follow up in 4-6 week for re-eval of ulcer.

## 2016-07-19 NOTE — Progress Notes (Signed)
Pre visit review using our clinic review tool, if applicable. No additional management support is needed unless otherwise documented below in the visit note. 

## 2016-07-19 NOTE — Assessment & Plan Note (Signed)
Offered wound care referral or unna boots.. Pt not interested.  Will continue current course of treatment as improvement seen. Strongly recommended compression hose to improve healing. Pt will consider. Follow up in 4 weeks.

## 2016-07-19 NOTE — Progress Notes (Signed)
   Subjective:    Patient ID: Karen Bowman, female    DOB: Jun 30, 1940, 76 y.o.   MRN: [id number]  HPI 76 year old female presents for follow up 4 weeks on right ankle venous ulcer.  At last OV 3/22.. Noted 1 cm  1.5 cm ulcer with yellowish discharge, present for > 1 year. Treated with elevation, compression hose topical bactroban per pt request.  Today she reports:  She feels that ulcer has decreased in size, no bigger. No odor, occ pain,no discharge. She has been applying bactroban daily. She has not been elevating or applying compression.    Review of Systems  Constitutional: Negative for fatigue.  HENT: Negative for ear pain.   Eyes: Negative for pain.  Respiratory: Negative for cough and shortness of breath.   Cardiovascular: Negative for chest pain.       Objective:   Physical Exam  Constitutional: Vital signs are normal. She appears well-developed and well-nourished. She is cooperative.  Non-toxic appearance. She does not appear ill. No distress.  HENT:  Head: Normocephalic.  Right Ear: Hearing, tympanic membrane, external ear and ear canal normal. Tympanic membrane is not erythematous, not retracted and not bulging.  Left Ear: Hearing, tympanic membrane, external ear and ear canal normal. Tympanic membrane is not erythematous, not retracted and not bulging.  Nose: No mucosal edema or rhinorrhea. Right sinus exhibits no maxillary sinus tenderness and no frontal sinus tenderness. Left sinus exhibits no maxillary sinus tenderness and no frontal sinus tenderness.  Mouth/Throat: Uvula is midline, oropharynx is clear and moist and mucous membranes are normal.  Eyes: Conjunctivae, EOM and lids are normal. Pupils are equal, round, and reactive to light. Lids are everted and swept, no foreign bodies found.  Neck: Trachea normal and normal range of motion. Neck supple. Carotid bruit is not present. No thyroid mass and no thyromegaly present.  Cardiovascular: Normal rate, regular  rhythm, S1 normal, S2 normal, normal heart sounds, intact distal pulses and normal pulses.  Exam reveals no gallop and no friction rub.   No murmur heard. Pulmonary/Chest: Effort normal and breath sounds normal. No tachypnea. No respiratory distress. She has no decreased breath sounds. She has no wheezes. She has no rhonchi. She has no rales.  Abdominal: Soft. Normal appearance and bowel sounds are normal. There is no tenderness.  Neurological: She is alert.  Skin: Skin is warm, dry and intact. No rash noted.  Chronic venous stasis changes. Decrease in central depth as well as diameter of ulcer to 1 cm  0.5 cm   Psychiatric: Her speech is normal and behavior is normal. Judgment and thought content normal. Her mood appears not anxious. Cognition and memory are normal. She does not exhibit a depressed mood.          Assessment & Plan:

## 2016-08-08 ENCOUNTER — Other Ambulatory Visit: Payer: Self-pay | Admitting: Family Medicine

## 2016-08-23 ENCOUNTER — Ambulatory Visit (INDEPENDENT_AMBULATORY_CARE_PROVIDER_SITE_OTHER): Payer: Medicare PPO | Admitting: Family Medicine

## 2016-08-23 ENCOUNTER — Encounter: Payer: Self-pay | Admitting: Family Medicine

## 2016-08-23 VITALS — BP 143/63 | HR 61 | Temp 98.5°F | Ht 65.0 in | Wt 132.8 lb

## 2016-08-23 DIAGNOSIS — I83013 Varicose veins of right lower extremity with ulcer of ankle: Secondary | ICD-10-CM | POA: Diagnosis not present

## 2016-08-23 DIAGNOSIS — L97319 Non-pressure chronic ulcer of right ankle with unspecified severity: Secondary | ICD-10-CM

## 2016-08-23 NOTE — Patient Instructions (Signed)
Please stop at the front desk to set up referral.  

## 2016-08-23 NOTE — Assessment & Plan Note (Addendum)
Poor healing, larger diameter and now with local reaction to adhesive .. change to koban and gauze. No current sign of infection.  Elevated and wear compression hose if tolerated.  Pt now agreeable to wound care referral.

## 2016-08-23 NOTE — Progress Notes (Signed)
   Subjective:    Patient ID: Karen Bowman, female    DOB: [birth year], 76 y.o.   MRN: [id number]  HPI 76 year old female with venous insufficiency presents for follow up on venous ulcer on right ankle . Has now been present for almost 1 year. She feels it has decreased in size and depth overall, but does now have surrounding erythema from where bandaid adhesive has caused reaction.   She tried to wear compression hose.. But causes pain in feet.  She has burning pain in feet in last few years mainly in right foot,.Unknown gradually worsening.  She is treating with natural med called neuronex.  She is not interested in neurontin to treat pain.   She is finally open to referral to wound care center.   Blood pressure (!) 143/63, pulse 61, temperature 98.5 F (36.9 C), temperature source Oral, height 5\' 5"  (1.651 m), weight 132 lb 12 oz (60.2 kg).   Review of Systems  Constitutional: Negative for fatigue and fever.  HENT: Negative for ear pain.   Eyes: Negative for pain.  Respiratory: Negative for chest tightness and shortness of breath.   Cardiovascular: Negative for chest pain, palpitations and leg swelling.  Gastrointestinal: Negative for abdominal pain.  Genitourinary: Negative for dysuria.       Objective:   Physical Exam  Constitutional: Vital signs are normal. She appears well-developed and well-nourished. She is cooperative.  Non-toxic appearance. She does not appear ill. No distress.  HENT:  Head: Normocephalic.  Right Ear: Hearing, tympanic membrane, external ear and ear canal normal. Tympanic membrane is not erythematous, not retracted and not bulging.  Left Ear: Hearing, tympanic membrane, external ear and ear canal normal. Tympanic membrane is not erythematous, not retracted and not bulging.  Nose: No mucosal edema or rhinorrhea. Right sinus exhibits no maxillary sinus tenderness and no frontal sinus tenderness. Left sinus exhibits no maxillary sinus tenderness and  no frontal sinus tenderness.  Mouth/Throat: Uvula is midline, oropharynx is clear and moist and mucous membranes are normal.  Eyes: Conjunctivae, EOM and lids are normal. Pupils are equal, round, and reactive to light. Lids are everted and swept, no foreign bodies found.  Neck: Trachea normal and normal range of motion. Neck supple. Carotid bruit is not present. No thyroid mass and no thyromegaly present.  Cardiovascular: Normal rate, regular rhythm, S1 normal, S2 normal, normal heart sounds, intact distal pulses and normal pulses.  Exam reveals no gallop and no friction rub.   No murmur heard. Pulmonary/Chest: Effort normal and breath sounds normal. No tachypnea. No respiratory distress. She has no decreased breath sounds. She has no wheezes. She has no rhonchi. She has no rales.  Abdominal: Soft. Normal appearance and bowel sounds are normal. There is no tenderness.  Neurological: She is alert.  Skin: Skin is warm, dry and intact. No rash noted.  Right medial ankle.. Central ulcer now 1.5x 1.5 cm, hard to tell depth given ammpount of topical med in place.. There is definitely granulation tissue at borders.. Pt uncomfortable so it is diffiuclt to clean wound fully   Bilateral venous stasis changes.  Psychiatric: Her speech is normal and behavior is normal. Judgment and thought content normal. Her mood appears not anxious. Cognition and memory are normal. She does not exhibit a depressed mood.          Assessment & Plan:

## 2016-08-27 ENCOUNTER — Encounter: Payer: Medicare PPO | Attending: Internal Medicine | Admitting: Internal Medicine

## 2016-08-27 DIAGNOSIS — I87331 Chronic venous hypertension (idiopathic) with ulcer and inflammation of right lower extremity: Secondary | ICD-10-CM | POA: Diagnosis not present

## 2016-08-27 DIAGNOSIS — I739 Peripheral vascular disease, unspecified: Secondary | ICD-10-CM | POA: Insufficient documentation

## 2016-08-27 DIAGNOSIS — I5022 Chronic systolic (congestive) heart failure: Secondary | ICD-10-CM | POA: Insufficient documentation

## 2016-08-27 DIAGNOSIS — M1990 Unspecified osteoarthritis, unspecified site: Secondary | ICD-10-CM | POA: Insufficient documentation

## 2016-08-27 DIAGNOSIS — Z7901 Long term (current) use of anticoagulants: Secondary | ICD-10-CM | POA: Diagnosis not present

## 2016-08-27 DIAGNOSIS — Z8673 Personal history of transient ischemic attack (TIA), and cerebral infarction without residual deficits: Secondary | ICD-10-CM | POA: Insufficient documentation

## 2016-08-27 DIAGNOSIS — I480 Paroxysmal atrial fibrillation: Secondary | ICD-10-CM | POA: Insufficient documentation

## 2016-08-27 DIAGNOSIS — L97311 Non-pressure chronic ulcer of right ankle limited to breakdown of skin: Secondary | ICD-10-CM | POA: Diagnosis not present

## 2016-08-27 DIAGNOSIS — L309 Dermatitis, unspecified: Secondary | ICD-10-CM | POA: Insufficient documentation

## 2016-08-27 DIAGNOSIS — I2510 Atherosclerotic heart disease of native coronary artery without angina pectoris: Secondary | ICD-10-CM | POA: Insufficient documentation

## 2016-08-27 DIAGNOSIS — G629 Polyneuropathy, unspecified: Secondary | ICD-10-CM | POA: Diagnosis not present

## 2016-08-27 NOTE — Progress Notes (Signed)
Karen Bowman, Karen Bowman ([id number]) Visit Report for 08/27/2016 Abuse/Suicide Risk Screen Details Patient Name: Karen Bowman, Karen Bowman Date of Service: 08/27/2016 8:00 AM Medical Record Patient Account Number: [id number] Number: Treating RN: Afful, RN, BSN, Rita 02-23-1941 [phone] y.o. Other Clinician: Date of Birth/Sex: Female) Treating ROBSON, MICHAEL Primary Care Gowri Suchan: Diona Browner, Amy Cachet Mccutchen/Extender: G Referring Bron Snellings: [name] Weeks in Treatment: 0 Abuse/Suicide Risk Screen Items Answer ABUSE/SUICIDE RISK SCREEN: Has anyone close to you tried to hurt or harm you recentlyo No Do you feel uncomfortable with anyone in your familyo No Has anyone forced you do things that you didnot want to doo No Do you have any thoughts of harming yourselfo No Patient displays signs or symptoms of abuse and/or neglect. No Electronic Signature(s) Signed: 08/27/2016 3:29:18 PM By: Regan Lemming BSN, RN Entered By: Regan Lemming on 08/27/2016 08:27:01 Karen Bowman ([id number]) -------------------------------------------------------------------------------- Activities of Daily Living Details Patient Name: Karen Bowman Date of Service: 08/27/2016 8:00 AM Medical Record Patient Account Number: [id number] Number: Treating RN: Afful, RN, BSN, Rita January 03, 1941 [phone] y.o. Other Clinician: Date of Birth/Sex: Female) Treating ROBSON, MICHAEL Primary Care Vedant Shehadeh: [name] Shylo Dillenbeck/Extender: G Referring Abubakr Wieman: [name] Weeks in Treatment: 0 Activities of Daily Living Items Answer Activities of Daily Living (Please select one for each item) Drive Automobile Completely Able Take Medications Completely Able Use Telephone Completely Able Care for Appearance Completely Able Use Toilet Completely Able Bath / Shower Completely Able Dress Self Completely Able Feed Self Completely Able Walk Completely Able Get In / Out Bed Completely Able Housework Completely  Able Prepare Meals Completely Able Handle Money Completely Able Shop for Self Completely Able Electronic Signature(s) Signed: 08/27/2016 3:29:18 PM By: Regan Lemming BSN, RN Entered By: Regan Lemming on 08/27/2016 08:26:49 Karen Bowman ([id number]) -------------------------------------------------------------------------------- Education Assessment Details Patient Name: Karen Bowman Date of Service: 08/27/2016 8:00 AM Medical Record Patient Account Number: [id number] Number: Treating RN: Afful, RN, BSN, Rita 29-Nov-1940 [phone] y.o. Other Clinician: Date of Birth/Sex: Female) Treating ROBSON, MICHAEL Primary Care Spike Desilets: [name] Damyra Luscher/Extender: G Referring Analiyah Lechuga: Leslye Peer in Treatment: 0 Primary Learner Assessed: Patient Learning Preferences/Education Level/Primary Language Learning Preference: Explanation Highest Education Level: High School Preferred Language: English Cognitive Barrier Assessment/Beliefs Language Barrier: No Physical Barrier Assessment Impaired Vision: No Impaired Hearing: No Decreased Hand dexterity: No Knowledge/Comprehension Assessment Knowledge Level: High Comprehension Level: High Ability to understand written High instructions: Ability to understand verbal High instructions: Motivation Assessment Anxiety Level: Calm Cooperation: Cooperative Education Importance: Acknowledges Need Interest in Health Problems: Asks Questions Perception: Coherent Willingness to Engage in Self- High Management Activities: Readiness to Engage in Self- High Management Activities: Electronic Signature(s) Signed: 08/27/2016 3:29:18 PM By: Regan Lemming BSN, RN Entered By: Regan Lemming on 08/27/2016 08:26:30 Karen Bowman ([name] ([id number]) -------------------------------------------------------------------------------- Fall Risk Assessment Details Patient Name: Karen Bowman Date of  Service: 08/27/2016 8:00 AM Medical Record Patient Account Number: [id number] Number: Treating RN: Afful, RN, BSN, Rita 27-Dec-1940 [phone] y.o. Other Clinician: Date of Birth/Sex: Female) Treating ROBSON, MICHAEL Primary Care Adya Wirz: [name] Wood Novacek/Extender: G Referring Amal Renbarger: [name] Weeks in Treatment: 0 Fall Risk Assessment Items Have you had 2 or more falls in the last 12 monthso 0 No Have you had any fall that resulted in injury in the last 12 monthso 0 No FALL RISK ASSESSMENT: History of falling - immediate or within 3 months 0 No Secondary diagnosis 0 No Ambulatory aid None/bed rest/wheelchair/nurse 0 Yes Crutches/cane/walker 0 No  Furniture 0 No IV Access/Saline Lock 0 No Gait/Training Normal/bed rest/immobile 0 Yes Weak 0 No Impaired 0 No Mental Status Oriented to own ability 0 Yes Electronic Signature(s) Signed: 08/27/2016 3:29:18 PM By: Regan Lemming BSN, RN Entered By: Regan Lemming on 08/27/2016 08:26:02 Karen Bowman ([id number]) -------------------------------------------------------------------------------- Foot Assessment Details Patient Name: Karen Bowman Date of Service: 08/27/2016 8:00 AM Medical Record Patient Account Number: [id number] Number: Treating RN: Afful, RN, BSN, Rita [birth year] [phone] y.o. Other Clinician: Date of Birth/Sex: Female) Treating ROBSON, MICHAEL Primary Care Miriah Maruyama: [name] Ozie Dimaria/Extender: G Referring Letanya Froh: [name] Weeks in Treatment: 0 Foot Assessment Items Site Locations + = Sensation present, - = Sensation absent, C = Callus, U = Ulcer R = Redness, W = Warmth, M = Maceration, PU = Pre-ulcerative lesion F = Fissure, S = Swelling, D = Dryness Assessment Right: Left: Other Deformity: No No Prior Foot Ulcer: No No Prior Amputation: No No Charcot Joint: No No Ambulatory Status: Ambulatory With Help Assistance Device: Cane Gait: Steady Electronic Signature(s) Signed:  08/27/2016 3:29:18 PM By: Regan Lemming BSN, RN Entered By: Regan Lemming on 08/27/2016 08:25:36 Karen Bowman ([name] ([id number]) -------------------------------------------------------------------------------- Nutrition Risk Assessment Details Patient Name: Karen Bowman Date of Service: 08/27/2016 8:00 AM Medical Record Patient Account Number: [id number] Number: Treating RN: Afful, RN, BSN, Rita 07-Aug-1940 [phone] y.o. Other Clinician: Date of Birth/Sex: Female) Treating ROBSON, MICHAEL Primary Care Anna-Marie Coller: Diona Browner, Amy Sherrick Araki/Extender: G Referring Xzaria Teo: [name] Weeks in Treatment: 0 Height (in): 65 Weight (lbs): 132 Body Mass Index (BMI): 22 Nutrition Risk Assessment Items NUTRITION RISK SCREEN: I have an illness or condition that made me change the kind and/or 0 No amount of food I eat I eat fewer than two meals per day 0 No I eat few fruits and vegetables, or milk products 0 No I have three or more drinks of beer, liquor or wine almost every day 0 No I have tooth or mouth problems that make it hard for me to eat 0 No I don't always have enough money to buy the food I need 0 No I eat alone most of the time 0 No I take three or more different prescribed or over-the-counter drugs a 0 No day Without wanting to, I have lost or gained 10 pounds in the last six 0 No months I am not always physically able to shop, cook and/or feed myself 0 No Nutrition Protocols Good Risk Protocol 0 No interventions needed Moderate Risk Protocol Electronic Signature(s) Signed: 08/27/2016 3:29:18 PM By: Regan Lemming BSN, RN Entered By: Regan Lemming on 08/27/2016 08:25:46

## 2016-08-28 NOTE — Progress Notes (Signed)
Karen Bowman, Karen Bowman ([id number]) Visit Report for 08/27/2016 Allergy List Details Patient Name: Karen Bowman, Karen Bowman Date of Service: 08/27/2016 8:00 AM Medical Record Number: [id number] Patient Account Number: [id number] Date of Birth/Sex: [birth year] (76 y.o. Female) Treating RN: Baruch Gouty, RN, BSN, Velva Harman Primary Care Maddax Palinkas: [name] Other Clinician: Referring Charie Pinkus: [name] Treating Aquilla Shambley/Extender: Ricard Dillon Weeks in Treatment: 0 Allergies Active Allergies seasonal allergies Allergy Notes Electronic Signature(s) Signed: 08/27/2016 3:29:18 PM By: Regan Lemming BSN, RN Entered By: Regan Lemming on 08/27/2016 08:32:29 Karen Bowman ([id number]) -------------------------------------------------------------------------------- Arrival Information Details Patient Name: Karen Bowman Date of Service: 08/27/2016 8:00 AM Medical Record Number: [id number] Patient Account Number: [id number] Date of Birth/Sex: [birth year] (76 y.o. Female) Treating RN: Afful, RN, BSN, Velva Harman Primary Care Minas Bonser: [name] Other Clinician: Referring Dorathy Stallone: [name] Treating Dontai Pember/Extender: Tito Dine in Treatment: 0 Visit Information Patient Arrived: Cane Arrival Time: 08:19 Accompanied By: self Transfer Assistance: None Patient Identification Verified: Yes Secondary Verification Process Completed: Yes Patient Requires Transmission-Based No Precautions: Patient Has Alerts: No Electronic Signature(s) Signed: 08/27/2016 3:29:18 PM By: Regan Lemming BSN, RN Entered By: Regan Lemming on 08/27/2016 08:23:34 Karen Bowman ([id number]) -------------------------------------------------------------------------------- Clinic Level of Care Assessment Details Patient Name: Karen Bowman Date of Service: 08/27/2016 8:00 AM Medical Record Number: [id number] Patient Account Number: [id number] Date of Birth/Sex: [birth year] (76 y.o. Female) Treating RN: Afful, RN,  BSN, Velva Harman Primary Care Keaundre Thelin: [name] Other Clinician: Referring Kemond Amorin: [name] Treating Tevon Berhane/Extender: Tito Dine in Treatment: 0 Clinic Level of Care Assessment Items TOOL 1 Quantity Score []  - Use when EandM and Procedure is performed on INITIAL visit 0 ASSESSMENTS - Nursing Assessment / Reassessment X - General Physical Exam (combine w/ comprehensive assessment (listed just 1 20 below) when performed on new pt. evals) X - Comprehensive Assessment (HX, ROS, Risk Assessments, Wounds Hx, etc.) 1 25 ASSESSMENTS - Wound and Skin Assessment / Reassessment X - Dermatologic / Skin Assessment (not related to wound area) 1 10 ASSESSMENTS - Ostomy and/or Continence Assessment and Care []  - Incontinence Assessment and Management 0 []  - Ostomy Care Assessment and Management (repouching, etc.) 0 PROCESS - Coordination of Care X - Simple Patient / Family Education for ongoing care 1 15 []  - Complex (extensive) Patient / Family Education for ongoing care 0 X - Staff obtains Programmer, systems, Records, Test Results / Process Orders 1 10 X - Staff telephones HHA, Nursing Homes / Clarify orders / etc 1 10 []  - Routine Transfer to another Facility (non-emergent condition) 0 []  - Routine Hospital Admission (non-emergent condition) 0 X - New Admissions / Biomedical engineer / Ordering NPWT, Apligraf, etc. 1 15 []  - Emergency Hospital Admission (emergent condition) 0 PROCESS - Special Needs []  - Pediatric / Minor Patient Management 0 []  - Isolation Patient Management 0 Karen Bowman, Karen Bowman ([id number]) []  - Hearing / Language / Visual special needs 0 []  - Assessment of Community assistance (transportation, D/C planning, etc.) 0 []  - Additional assistance / Altered mentation 0 []  - Support Surface(s) Assessment (bed, cushion, seat, etc.) 0 INTERVENTIONS - Miscellaneous []  - External ear exam 0 []  - Patient Transfer (multiple staff / Civil Service fast streamer / Similar devices) 0 []  -  Simple Staple / Suture removal (25 or less) 0 []  - Complex Staple / Suture removal (26 or more) 0 []  - Hypo/Hyperglycemic Management (do not check if billed separately) 0 X - Ankle / Brachial Index (ABI) - do not check if billed separately 1 15 Has  the patient been seen at the hospital within the last three years: Yes Total Score: 120 Level Of Care: New/Established - Level 4 Electronic Signature(s) Signed: 08/27/2016 3:29:18 PM By: Regan Lemming BSN, RN Entered By: Regan Lemming on 08/27/2016 09:23:46 Karen Bowman ([id number]) -------------------------------------------------------------------------------- Encounter Discharge Information Details Patient Name: Karen Bowman Date of Service: 08/27/2016 8:00 AM Medical Record Number: [id number] Patient Account Number: [id number] Date of Birth/Sex: [birth year] (76 y.o. Female) Treating RN: Afful, RN, BSN, Velva Harman Primary Care Mercer Peifer: [name] Other Clinician: Referring Elsey Holts: [name] Treating Karen Bowman/Extender: Tito Dine in Treatment: 0 Encounter Discharge Information Items Discharge Pain Level: 0 Discharge Condition: Stable Ambulatory Status: Cane Discharge Destination: Home Transportation: Private Auto Accompanied By: self Schedule Follow-up Appointment: No Medication Reconciliation completed No and provided to Patient/Care Malak Orantes: Provided on Clinical Summary of Care: 08/27/2016 Form Type Recipient Paper Patient LE Electronic Signature(s) Signed: 08/27/2016 9:51:28 AM By: Ruthine Dose Entered By: Ruthine Dose on 08/27/2016 09:51:27 Karen Bowman ([id number]) -------------------------------------------------------------------------------- Lower Extremity Assessment Details Patient Name: Karen Bowman Date of Service: 08/27/2016 8:00 AM Medical Record Number: [id number] Patient Account Number: [id number] Date of Birth/Sex: 1940/06/23 (76 y.o. Female) Treating RN: Afful, RN, BSN,  [place] Primary Care Donn Zanetti: [name] Other Clinician: Referring Eletha Culbertson: [name] Treating Icy Fuhrmann/Extender: Tito Dine in Treatment: 0 Edema Assessment Assessed: [Left: No] [Right: No] Edema: [Left: No] [Right: Yes] Calf Left: Right: Point of Measurement: 35 cm From Medial Instep 32 cm 35 cm Ankle Left: Right: Point of Measurement: 9 cm From Medial Instep 22.5 cm 26 cm Vascular Assessment Claudication: Claudication Assessment [Left:None] [Right:None] Pulses: Dorsalis Pedis Palpable: [Left:Yes] [Right:Yes] Doppler Audible: [Left:Yes] [Right:Yes] Posterior Tibial Doppler Audible: [Left:Yes] [Right:Yes] Extremity colors, hair growth, and conditions: Extremity Color: [Left:Normal] [Right:Mottled] Hair Growth on Extremity: [Left:Yes] [Right:Yes] Temperature of Extremity: [Left:Warm] [Right:Warm] Capillary Refill: [Left:< 3 seconds] [Right:< 3 seconds] Blood Pressure: Brachial: [Left:180] [Right:180] Dorsalis Pedis: 180 [Left:Dorsalis Pedis: 210] Ankle: Posterior Tibial: [Left:Posterior Tibial: 1.00] [Right:1.17] Toe Nail Assessment Left: Right: Thick: No No Discolored: No No Deformed: No No Karen Bowman, Karen Bowman ([id number]) Improper Length and Hygiene: No No Electronic Signature(s) Signed: 08/27/2016 3:29:18 PM By: Regan Lemming BSN, RN Entered By: Regan Lemming on 08/27/2016 08:51:51 Karen Bowman ([id number]) -------------------------------------------------------------------------------- Multi Wound Chart Details Patient Name: Karen Bowman Date of Service: 08/27/2016 8:00 AM Medical Record Number: [id number] Patient Account Number: [id number] Date of Birth/Sex: Jan 01, 1941 (76 y.o. Female) Treating RN: Baruch Gouty, RN, BSN, Velva Harman Primary Care Rebekha Diveley: [name] Other Clinician: Referring Aerin Delany: [name] Treating Dorette Hartel/Extender: Ricard Dillon Weeks in Treatment: 0 Vital Signs Height(in): 65 Pulse(bpm): 51 Weight(lbs): 132  Blood Pressure 180/77 (mmHg): Body Mass Index(BMI): 22 Temperature(F): 98.4 Respiratory Rate 16 (breaths/min): Photos: [1:No Photos] [N/A:N/A] Wound Location: [1:Right Ankle - Medial] [N/A:N/A] Wounding Event: [1:Gradually Appeared] [N/A:N/A] Primary Etiology: [1:Vasculopathy] [N/A:N/A] Comorbid History: [1:Arrhythmia, Coronary Artery Disease, Peripheral Arterial Disease, Peripheral Venous Disease, Osteoarthritis, Neuropathy] [N/A:N/A] Date Acquired: [1:09/30/2015] [N/A:N/A] Weeks of Treatment: [1:0] [N/A:N/A] Wound Status: [1:Open] [N/A:N/A] Measurements L x W x D 1.7x2x0.1 [N/A:N/A] (cm) Area (cm) : [1:2.67] [N/A:N/A] Volume (cm) : [1:0.267] [N/A:N/A] Classification: [1:Full Thickness Without Exposed Support Structures] [N/A:N/A] Exudate Amount: [1:Medium] [N/A:N/A] Exudate Type: [1:Serosanguineous] [N/A:N/A] Exudate Color: [1:red, brown] [N/A:N/A] Wound Margin: [1:Distinct, outline attached N/A] Granulation Amount: [1:None Present (0%)] [N/A:N/A] Necrotic Amount: [1:Large (67-100%)] [N/A:N/A] Exposed Structures: [1:Fat Layer (Subcutaneous N/A Tissue) Exposed: Yes Fascia: No Tendon: No] Muscle: No Joint: No Bone: No Epithelialization: None N/A N/A Debridement: Debridement ([id number]- N/A N/A  Karen Bowman) Pre-procedure 09:23 N/A N/A Verification/Time Out Taken: Pain Control: Lidocaine 4% Topical N/A N/A Solution Tissue Debrided: Fibrin/Slough, Fat, N/A N/A Exudates, Subcutaneous Level: Skin/Subcutaneous N/A N/A Tissue Debridement Area (sq 3.4 N/A N/A cm): Instrument: Curette N/A N/A Bleeding: Minimum N/A N/A Hemostasis Achieved: Pressure N/A N/A Procedural Pain: 0 N/A N/A Post Procedural Pain: 0 N/A N/A Debridement Treatment Procedure was tolerated N/A N/A Response: well Post Debridement 1.7x2x0.2 N/A N/A Measurements L x W x D (cm) Post Debridement 0.534 N/A N/A Volume: (cm) Periwound Skin Texture: Excoriation: Yes N/A N/A Induration: No Callus: No Crepitus:  No Rash: No Scarring: No Periwound Skin Maceration: No N/A N/A Moisture: Dry/Scaly: No Periwound Skin Color: Erythema: Yes N/A N/A Hemosiderin Staining: Yes Mottled: Yes Atrophie Blanche: No Cyanosis: No Ecchymosis: No Pallor: No Rubor: No Erythema Location: Circumferential N/A N/A Temperature: No Abnormality N/A N/A Tenderness on Yes N/A N/A Palpation: Wound Preparation: N/A N/A Karen Bowman ([id number]) Ulcer Cleansing: Rinsed/Irrigated with Saline Topical Anesthetic Applied: Other: lidocaine 4% Procedures Performed: Debridement N/A N/A Treatment Notes Wound #1 (Right, Medial Ankle) 1. Cleansed with: Clean wound with Normal Saline 3. Peri-wound Care: Other peri-wound care (specify in notes) 4. Dressing Applied: Santyl Ointment 5. Secondary Dressing Applied ABD Pad Dry Gauze 7. Secured with 3 Layer Compression System - Right Lower Extremity Notes TCA Electronic Signature(s) Signed: 08/27/2016 5:12:21 PM By: Karen Ham MD Entered By: Karen Bowman on 08/27/2016 10:27:49 Karen Bowman ([id number]) -------------------------------------------------------------------------------- [place] Details Patient Name: Karen Bowman Date of Service: 08/27/2016 8:00 AM Medical Record Number: [id number] Patient Account Number: [id number] Date of Birth/Sex: [birth year] (76 y.o. Female) Treating RN: Afful, RN, BSN, Velva Harman Primary Care Imri Lor: [name] Other Clinician: Referring Eboni Coval: [name] Treating Desiree Daise/Extender: Tito Dine in Treatment: 0 Active Inactive ` Orientation to the Wound Care Program Nursing Diagnoses: Knowledge deficit related to the wound healing center program Goals: Patient/caregiver will verbalize understanding of the [facility] Date Initiated: 08/27/2016 Target Resolution Date: 01/27/2017 Goal Status: Active Interventions: Provide education on orientation to the  wound center Notes: ` Venous Leg Ulcer Nursing Diagnoses: Knowledge deficit related to disease process and management Potential for venous Insuffiency (use before diagnosis confirmed) Goals: Non-invasive venous studies are completed as ordered Date Initiated: 08/27/2016 Target Resolution Date: 01/27/2017 Goal Status: Active Patient will maintain optimal edema control Date Initiated: 08/27/2016 Target Resolution Date: 01/27/2017 Goal Status: Active Patient/caregiver will verbalize understanding of disease process and disease management Date Initiated: 08/27/2016 Target Resolution Date: 01/27/2017 Goal Status: Active Verify adequate tissue perfusion prior to therapeutic compression application Date Initiated: 08/27/2016 Target Resolution Date: 01/27/2017 Goal Status: Active Karen Bowman, Karen Bowman ([id number]) Interventions: Assess peripheral edema status every visit. Compression as ordered Provide education on venous insufficiency Treatment Activities: Non-invasive vascular studies : 08/27/2016 Test ordered outside of clinic : 08/27/2016 Therapeutic compression applied : 08/27/2016 Venous Duplex Doppler : 08/27/2016 Notes: ` Wound/Skin Impairment Nursing Diagnoses: Impaired tissue integrity Knowledge deficit related to ulceration/compromised skin integrity Goals: Patient/caregiver will verbalize understanding of skin care regimen Date Initiated: 08/27/2016 Target Resolution Date: 01/27/2017 Goal Status: Active Ulcer/skin breakdown will have a volume reduction of 30% by week 4 Date Initiated: 08/27/2016 Target Resolution Date: 01/27/2017 Goal Status: Active Ulcer/skin breakdown will have a volume reduction of 50% by week 8 Date Initiated: 08/27/2016 Target Resolution Date: 01/27/2017 Goal Status: Active Ulcer/skin breakdown will have a volume reduction of 80% by week 12 Date Initiated: 08/27/2016 Target Resolution Date: 01/27/2017 Goal Status: Active Ulcer/skin breakdown will  heal within 14 weeks Date Initiated: 08/27/2016 Target Resolution Date: 01/27/2017 Goal Status: Active Interventions: Assess patient/caregiver ability to obtain necessary supplies Assess patient/caregiver ability to perform ulcer/skin care regimen upon admission and as needed Assess ulceration(s) every visit Provide education on ulcer and skin care Treatment Activities: Skin care regimen initiated : 08/27/2016 Karen Bowman ([id number]) Topical wound management initiated : 08/27/2016 Notes: Electronic Signature(s) Signed: 08/27/2016 3:29:18 PM By: Regan Lemming BSN, RN Entered By: Regan Lemming on 08/27/2016 09:22:36 Karen Bowman ([id number]) -------------------------------------------------------------------------------- Pain Assessment Details Patient Name: Karen Bowman Date of Service: 08/27/2016 8:00 AM Medical Record Number: [id number] Patient Account Number: [id number] Date of Birth/Sex: 09/11/1940 (76 y.o. Female) Treating RN: Afful, RN, BSN, Velva Harman Primary Care Reeya Bound: [name] Other Clinician: Referring Marticia Reifschneider: [name] Treating Delonda Coley/Extender: Tito Dine in Treatment: 0 Active Problems Location of Pain Severity and Description of Pain Patient Has Paino Yes Site Locations Pain Location: Pain in Ulcers With Dressing Change: Yes Rate the pain. Current Pain Level: 3 Character of Pain Describe the Pain: Tender Pain Management and Medication Current Pain Management: Medication: Yes Rest: Yes How does your wound impact your activities of daily livingo Sleep: Yes Bathing: Yes Appetite: Yes Relationship With Others: Yes Bladder Continence: Yes Emotions: Yes Bowel Continence: Yes Work: Yes Toileting: Yes Drive: Yes Dressing: Yes Hobbies: Yes Engineer, maintenance) Signed: 08/27/2016 3:29:18 PM By: Regan Lemming BSN, RN Entered By: Regan Lemming on 08/27/2016 08:23:56 Karen Bowman  ([id number]) -------------------------------------------------------------------------------- Patient/Caregiver Education Details Patient Name: Karen Bowman Date of Service: 08/27/2016 8:00 AM Medical Record Patient Account Number: [id number] Number: Treating RN: Afful, RN, BSN, Rita 10/26/1940 [phone] y.o. Other Clinician: Date of Birth/Gender: Female) Treating ROBSON, MICHAEL Primary Care Physician: [name] Physician/Extender: G Referring Physician: Leslye Peer in Treatment: 0 Education Assessment Education Provided To: Patient Education Topics Provided Venous: Methods: Explain/Verbal Responses: State content correctly Welcome To [facility]: Methods: Explain/Verbal Responses: State content correctly Wound/Skin Impairment: Methods: Explain/Verbal Responses: State content correctly Electronic Signature(s) Signed: 08/27/2016 3:29:18 PM By: Regan Lemming BSN, RN Entered By: Regan Lemming on 08/27/2016 09:49:43 Karen Bowman ([id number]) -------------------------------------------------------------------------------- Wound Assessment Details Patient Name: Karen Bowman Date of Service: 08/27/2016 8:00 AM Medical Record Number: [id number] Patient Account Number: [id number] Date of Birth/Sex: 1940-12-23 (76 y.o. Female) Treating RN: Afful, RN, BSN, [place] Primary Care Alicha Raspberry: [name] Other Clinician: Referring Eliyah Bazzi: [name] Treating Tracer Gutridge/Extender: Ricard Dillon Weeks in Treatment: 0 Wound Status Wound Number: 1 Primary Vasculopathy Etiology: Wound Location: Right Ankle - Medial Wound Open Wounding Event: Gradually Appeared Status: Date Acquired: 09/30/2015 Comorbid Arrhythmia, Coronary Artery Disease, Weeks Of Treatment: 0 History: Peripheral Arterial Disease, Peripheral Clustered Wound: No Venous Disease, Osteoarthritis, Neuropathy Wound Measurements Length: (cm) 1.7 Width: (cm) 2 Depth: (cm) 0.1 Area:  (cm) 2.67 Volume: (cm) 0.267 % Reduction in Area: % Reduction in Volume: Epithelialization: None Tunneling: No Undermining: No Wound Description Full Thickness Without Exposed Classification: Support Structures Wound Margin: Distinct, outline attached Exudate Medium Amount: Exudate Type: Serosanguineous Exudate Color: red, brown Foul Odor After Cleansing: No Slough/Fibrino Yes Wound Bed Granulation Amount: None Present (0%) Exposed Structure Necrotic Amount: Large (67-100%) Fascia Exposed: No Necrotic Quality: Adherent Slough Fat Layer (Subcutaneous Tissue) Exposed: Yes Tendon Exposed: No Muscle Exposed: No Joint Exposed: No Bone Exposed: No Periwound Skin Texture Texture Color No Abnormalities Noted: No No Abnormalities Noted: No Callus: No Atrophie Blanche: No KARIELLE, DAVIDOW ([id number]) Crepitus: No Cyanosis: No Excoriation: Yes Ecchymosis: No  Induration: No Erythema: Yes Rash: No Erythema Location: Circumferential Scarring: No Hemosiderin Staining: Yes Mottled: Yes Moisture Pallor: No No Abnormalities Noted: No Rubor: No Dry / Scaly: No Maceration: No Temperature / Pain Temperature: No Abnormality Tenderness on Palpation: Yes Wound Preparation Ulcer Cleansing: Rinsed/Irrigated with Saline Topical Anesthetic Applied: Other: lidocaine 4%, Treatment Notes Wound #1 (Right, Medial Ankle) 1. Cleansed with: Clean wound with Normal Saline 3. Peri-wound Care: Other peri-wound care (specify in notes) 4. Dressing Applied: Santyl Ointment 5. Secondary Dressing Applied ABD Pad Dry Gauze 7. Secured with 3 Layer Compression System - Right Lower Extremity Notes TCA Electronic Signature(s) Signed: 08/27/2016 3:29:18 PM By: Regan Lemming BSN, RN Entered By: Regan Lemming on 08/27/2016 08:41:46 Karen Bowman ([id number]) -------------------------------------------------------------------------------- Vitals Details Patient Name: Karen Bowman Date of Service: 08/27/2016 8:00 AM Medical Record Number: [id number] Patient Account Number: [id number] Date of Birth/Sex: [birth year] (76 y.o. Female) Treating RN: Afful, RN, BSN, [place] Primary Care Inessa Wardrop: [name] Other Clinician: Referring Shon Indelicato: [name] Treating Aayla Marrocco/Extender: Tito Dine in Treatment: 0 Vital Signs Time Taken: 08:24 Temperature (F): 98.4 Height (in): 65 Pulse (bpm): 51 Source: Stated Respiratory Rate (breaths/min): 16 Weight (lbs): 132 Blood Pressure (mmHg): 180/77 Source: Measured Reference Range: 80 - 120 mg / dl Body Mass Index (BMI): 22 Electronic Signature(s) Signed: 08/27/2016 3:29:18 PM By: Regan Lemming BSN, RN Entered By: Regan Lemming on 08/27/2016 08:25:05

## 2016-08-28 NOTE — Progress Notes (Signed)
Karen, Bowman ([id number]) Visit Report for 08/27/2016 Chief Complaint Document Details Patient Name: Karen Bowman, Karen Bowman Date of Service: 08/27/2016 8:00 AM Medical Record Patient Account Number: [id number] Number: Treating RN: Afful, RN, BSN, Rita May 02, 1940 [phone] y.o. Other Clinician: Date of Birth/Sex: Female) Treating ROBSON, MICHAEL Primary Care Provider: [name] Provider/Extender: G Referring Provider: [name] Weeks in Treatment: 0 Information Obtained from: Patient Chief Complaint 08/27/16; patient is here for review of a chronic wound on the right medial ankle that has been present for one year Electronic Signature(s) Signed: 08/27/2016 5:12:21 PM By: Linton Ham MD Entered By: Linton Ham on 08/27/2016 10:28:34 Karen Bowman ([id number]) -------------------------------------------------------------------------------- Debridement Details Patient Name: Karen Bowman Date of Service: 08/27/2016 8:00 AM Medical Record Patient Account Number: [id number] Number: Treating RN: Afful, RN, BSN, Rita [birth year] [phone] y.o. Other Clinician: Date of Birth/Sex: Female) Treating ROBSON, [place] Primary Care Provider: Diona Browner, Amy Provider/Extender: G Referring Provider: [name] Weeks in Treatment: 0 Debridement Performed for Wound #1 Right,Medial Ankle Assessment: Performed By: Physician Ricard Dillon, MD Debridement: Debridement Pre-procedure Verification/Time Out Yes - 09:23 Taken: Start Time: 09:23 Pain Control: Lidocaine 4% Topical Solution Level: Skin/Subcutaneous Tissue Total Area Debrided (L x 1.7 (cm) x 2 (cm) = 3.4 (cm) W): Tissue and other Non-Viable, Exudate, Fat, Fibrin/Slough, Subcutaneous material debrided: Instrument: Curette Bleeding: Minimum Hemostasis Achieved: Pressure End Time: 09:25 Procedural Pain: 0 Post Procedural Pain: 0 Response to Treatment: Procedure was tolerated well Post  Debridement Measurements of Total Wound Length: (cm) 1.7 Width: (cm) 2 Depth: (cm) 0.2 Volume: (cm) 0.534 Character of Wound/Ulcer Post Requires Further Debridement Debridement: Post Procedure Diagnosis Same as Pre-procedure Electronic Signature(s) Signed: 08/27/2016 3:29:18 PM By: Regan Lemming BSN, RN Signed: 08/27/2016 5:12:21 PM By: Linton Ham MD Entered By: Linton Ham on 08/27/2016 10:28:03 Karen Bowman ([name] ([id number]) -------------------------------------------------------------------------------- HPI Details Patient Name: Karen Bowman Date of Service: 08/27/2016 8:00 AM Medical Record Patient Account Number: [id number] Number: Treating RN: Afful, RN, BSN, Rita 1940-07-03 [phone] y.o. Other Clinician: Date of Birth/Sex: Female) Treating ROBSON, MICHAEL Primary Care Provider: [name] Provider/Extender: G Referring Provider: [name] Weeks in Treatment: 0 History of Present Illness HPI Description: 08/27/16; his is a 76 year old woman who has been dealing with a ulcer on her right medial ankle for about a year. She is uncertain about how this actually opened however it is not really changed that much in quite some time. She has a history of bleeding per Deniece Ree these on the left more remotely but no other ulcer history. She is not a diabetic but she does have chronic right leg greater than left leg swelling. Looking through [employer] she has a history of eczema, systolic congestive heart failure left CVA and paroxysmal A. fib on Eliquis. She has been using Neosporin and more recently Bactroban without improvement. She saw her primary physician who referred her here, notes they're suggesting Coban and gauze. Dimensions of the wounds 1.7 x 2 x 0.1. ABIs in this clinic were 1.0 bilaterally. Electronic Signature(s) Signed: 08/27/2016 5:12:21 PM By: Linton Ham MD Entered By: Linton Ham on 08/27/2016  10:31:41 Karen Bowman ([id number]) -------------------------------------------------------------------------------- Physical Exam Details Patient Name: Karen Bowman Date of Service: 08/27/2016 8:00 AM Medical Record Patient Account Number: [id number] Number: Treating RN: Afful, RN, BSN, Rita Nov 05, 1940 [phone] y.o. Other Clinician: Date of Birth/Sex: Female) Treating ROBSON, MICHAEL Primary Care Provider: [name] Provider/Extender: G Referring Provider: [name] Weeks in Treatment: 0  Constitutional Patient is hypertensive.. Pulse regular and within target range for patient.Marland Kitchen Respirations regular, non-labored and within target range.. Temperature is normal and within the target range for the patient.Marland Kitchen appears in no distress. Respiratory Respiratory effort is easy and symmetric bilaterally. Rate is normal at rest and on room air.. Bilateral breath sounds are clear and equal in all lobes with no wheezes, rales or rhonchi.. Cardiovascular Heart rhythm and rate regular, without murmur or gallop.. Femoral arteries without bruits and pulses strong.. Pedal pulses palpable and strong bilaterally.. Edema present in both extremities. Right greater than left this is pitting. She also has visible varicosities in her Thighs.. Gastrointestinal (GI) Abdomen is soft and non-distended without masses or tenderness. Bowel sounds active in all quadrants.. Lymphatic None palpable in the inguinal area bilaterally. Psychiatric Patient is somewhat anxious. Notes Wound exam; the patient has an irregular but superficial wound on the right medial ankle. She arrives in our clinic today with adherent necrotic material over the surface of the wound. She also has a considerable degree of surrounding erythema but no tenderness is noted. I think this represents either venous inflammation or possibly a contact dermatitis from one of the topical antibiotics. I do not believe there  is cellulitis. Using a #3 curet the wound is debrided of surface necrotic material. Hemostasis with direct pressure. There is some depth to this however reasonably healthy-looking granulation. Electronic Signature(s) Signed: 08/27/2016 5:12:21 PM By: Linton Ham MD Entered By: Linton Ham on 08/27/2016 10:34:21 Karen Bowman ([id number]) -------------------------------------------------------------------------------- Physician Orders Details Patient Name: Karen Bowman Date of Service: 08/27/2016 8:00 AM Medical Record Patient Account Number: [id number] Number: Treating RN: Afful, RN, BSN, Rita [birth year] [phone] y.o. Other Clinician: Date of Birth/Sex: Female) Treating ROBSON, MICHAEL Primary Care Provider: [name] Provider/Extender: G Referring Provider: Leslye Peer in Treatment: 0 Verbal / Phone Orders: No Diagnosis Coding Wound Cleansing Wound #1 Right,Medial Ankle o Clean wound with Normal Saline. o May shower with protection. - USE CAST PROTECTOR o No tub bath. Anesthetic Wound #1 Right,Medial Ankle o Topical Lidocaine 4% cream applied to wound bed prior to debridement Skin Barriers/Peri-Wound Care Wound #1 Right,Medial Ankle o Triamcinolone Acetonide Ointment Primary Wound Dressing Wound #1 Right,Medial Ankle o Santyl Ointment Secondary Dressing Wound #1 Right,Medial Ankle o ABD pad o Dry Gauze Dressing Change Frequency Wound #1 Right,Medial Ankle o Change dressing every week Follow-up Appointments Wound #1 Right,Medial Ankle o Return Appointment in 1 week. Edema Control Wound #1 Right,Medial Ankle TRINIDAD, INGLE ([id number]) o 3 Layer Compression System - Right Lower Extremity o Elevate legs to the level of the heart and pump ankles as often as possible Additional Orders / Instructions Wound #1 Right,Medial Ankle o Increase protein intake. o Activity as tolerated Medications-please add to  medication list. Wound #1 Right,Medial Ankle o Santyl Enzymatic Ointment Services and Therapies o Venous Duplex Doppler - right leg Electronic Signature(s) Signed: 08/27/2016 3:29:18 PM By: Regan Lemming BSN, RN Signed: 08/27/2016 5:12:21 PM By: Linton Ham MD Entered By: Regan Lemming on 08/27/2016 09:50:27 Karen Bowman ([id number]) -------------------------------------------------------------------------------- Problem List Details Patient Name: Karen Bowman Date of Service: 08/27/2016 8:00 AM Medical Record Patient Account Number: [id number] Number: Treating RN: Afful, RN, BSN, Rita Oct 25, 1940 [phone] y.o. Other Clinician: Date of Birth/Sex: Female) Treating ROBSON, MICHAEL Primary Care Provider: [name] Provider/Extender: G Referring Provider: Leslye Peer in Treatment: 0 Active Problems ICD-10 Encounter Code Description Active Date Diagnosis L97.311 Non-pressure chronic ulcer of right ankle limited to  08/27/2016 Yes breakdown of skin I87.331 Chronic venous hypertension (idiopathic) with ulcer and 08/27/2016 Yes inflammation of right lower extremity Inactive Problems Resolved Problems Electronic Signature(s) Signed: 08/27/2016 5:12:21 PM By: Linton Ham MD Entered By: Linton Ham on 08/27/2016 10:27:24 Karen Bowman ([id number]) -------------------------------------------------------------------------------- Progress Note Details Patient Name: Karen Bowman Date of Service: 08/27/2016 8:00 AM Medical Record Patient Account Number: [id number] Number: Treating RN: Afful, RN, BSN, Rita [birth year] [phone] y.o. Other Clinician: Date of Birth/Sex: Female) Treating ROBSON, MICHAEL Primary Care Provider: [name] Provider/Extender: G Referring Provider: [name] Weeks in Treatment: 0 Subjective Chief Complaint Information obtained from Patient 08/27/16; patient is here for review of a chronic wound on the right  medial ankle that has been present for one year History of Present Illness (HPI) 08/27/16; his is a 76 year old woman who has been dealing with a ulcer on her right medial ankle for about a year. She is uncertain about how this actually opened however it is not really changed that much in quite some time. She has a history of bleeding per Deniece Ree these on the left more remotely but no other ulcer history. She is not a diabetic but she does have chronic right leg greater than left leg swelling. Looking through [employer] she has a history of eczema, systolic congestive heart failure left CVA and paroxysmal A. fib on Eliquis. She has been using Neosporin and more recently Bactroban without improvement. She saw her primary physician who referred her here, notes they're suggesting Coban and gauze. Dimensions of the wounds 1.7 x 2 x 0.1. ABIs in this clinic were 1.0 bilaterally. Wound History Patient presents with 1 open wound that has been present for approximately 1 year. The wound has been healed in the past but has re-opened. Laboratory tests have not been performed in the last month. Patient reportedly has not tested positive for an antibiotic resistant organism. Patient reportedly has not tested positive for osteomyelitis. Patient reportedly has not had testing performed to evaluate circulation in the legs. Patient experiences the following problems associated with their wounds: infection, swelling. Patient History Information obtained from Patient. Allergies seasonal allergies Family History Cancer - Siblings, Father, Heart Disease - Father, Paternal Grandparents, Maternal Grandparents, Hypertension - Mother, Father, Siblings, Lung Disease - Siblings, Seizures - Siblings, No family history of Diabetes, Hereditary Spherocytosis, Kidney Disease, Stroke, Thyroid Problems, KALLY, CADDEN ([id number]) Tuberculosis. Social History Never smoker, Marital Status - Married, Alcohol Use -  Never, Caffeine Use - Moderate. Medical History Eyes Denies history of Cataracts, Glaucoma, Optic Neuritis Ear/Nose/Mouth/Throat Denies history of Chronic sinus problems/congestion, Middle ear problems Hematologic/Lymphatic Denies history of Anemia, Hemophilia, Human Immunodeficiency Virus, Lymphedema, Sickle Cell Disease Respiratory Denies history of Aspiration, Asthma, Chronic Obstructive Pulmonary Disease (COPD), Pneumothorax, Sleep Apnea, Tuberculosis Cardiovascular Patient has history of Arrhythmia, Coronary Artery Disease, Peripheral Arterial Disease, Peripheral Venous Disease Gastrointestinal Denies history of Cirrhosis , Colitis, Crohn s, Hepatitis A, Hepatitis B, Hepatitis C Endocrine Denies history of Type I Diabetes, Type II Diabetes Genitourinary Denies history of End Stage Renal Disease Immunological Denies history of Lupus Erythematosus, Raynaud s, Scleroderma Integumentary (Skin) Denies history of History of Burn, History of pressure wounds Musculoskeletal Patient has history of Osteoarthritis Neurologic Patient has history of Neuropathy Denies history of Dementia, Quadriplegia, Paraplegia, Seizure Disorder Oncologic Denies history of Received Chemotherapy, Received Radiation Psychiatric Denies history of Anorexia/bulimia, Confinement Anxiety Review of Systems (ROS) Constitutional Symptoms (General Health) The patient has no complaints or symptoms. Eyes Complains or has symptoms of  Glasses / Contacts. Ear/Nose/Mouth/Throat The patient has no complaints or symptoms. Hematologic/Lymphatic The patient has no complaints or symptoms. Respiratory The patient has no complaints or symptoms. Cardiovascular Complains or has symptoms of LE edema. Karen Bowman ([id number]) Gastrointestinal The patient has no complaints or symptoms. Endocrine The patient has no complaints or symptoms. Genitourinary The patient has no complaints or  symptoms. Immunological The patient has no complaints or symptoms. Integumentary (Skin) Complains or has symptoms of Wounds, Breakdown, Swelling. Musculoskeletal The patient has no complaints or symptoms. Oncologic The patient has no complaints or symptoms. Psychiatric The patient has no complaints or symptoms. Objective Constitutional Patient is hypertensive.. Pulse regular and within target range for patient.Marland Kitchen Respirations regular, non-labored and within target range.. Temperature is normal and within the target range for the patient.Marland Kitchen appears in no distress. Vitals Time Taken: 8:24 AM, Height: 65 in, Source: Stated, Weight: 132 lbs, Source: Measured, BMI: 22, Temperature: 98.4 F, Pulse: 51 bpm, Respiratory Rate: 16 breaths/min, Blood Pressure: 180/77 mmHg. Respiratory Respiratory effort is easy and symmetric bilaterally. Rate is normal at rest and on room air.. Bilateral breath sounds are clear and equal in all lobes with no wheezes, rales or rhonchi.. Cardiovascular Heart rhythm and rate regular, without murmur or gallop.. Femoral arteries without bruits and pulses strong.. Pedal pulses palpable and strong bilaterally.. Edema present in both extremities. Right greater than left this is pitting. She also has visible varicosities in her Thighs.. Gastrointestinal (GI) Abdomen is soft and non-distended without masses or tenderness. Bowel sounds active in all quadrants.. Lymphatic None palpable in the inguinal area bilaterally. Psychiatric Patient is somewhat anxious. Karen Bowman ([id number]) General Notes: Wound exam; the patient has an irregular but superficial wound on the right medial ankle. She arrives in our clinic today with adherent necrotic material over the surface of the wound. She also has a considerable degree of surrounding erythema but no tenderness is noted. I think this represents either venous inflammation or possibly a contact dermatitis from one of the  topical antibiotics. I do not believe there is cellulitis. Using a #3 curet the wound is debrided of surface necrotic material. Hemostasis with direct pressure. There is some depth to this however reasonably healthy-looking granulation. Integumentary (Hair, Skin) Wound #1 status is Open. Original cause of wound was Gradually Appeared. The wound is located on the Right,Medial Ankle. The wound measures 1.7cm length x 2cm width x 0.1cm depth; 2.67cm^2 area and 0.267cm^3 volume. There is Fat Layer (Subcutaneous Tissue) Exposed exposed. There is no tunneling or undermining noted. There is a medium amount of serosanguineous drainage noted. The wound margin is distinct with the outline attached to the wound base. There is no granulation within the wound bed. There is a large (67-100%) amount of necrotic tissue within the wound bed including Adherent Slough. The periwound skin appearance exhibited: Excoriation, Hemosiderin Staining, Mottled, Erythema. The periwound skin appearance did not exhibit: Callus, Crepitus, Induration, Rash, Scarring, Dry/Scaly, Maceration, Atrophie Blanche, Cyanosis, Ecchymosis, Pallor, Rubor. The surrounding wound skin color is noted with erythema which is circumferential. Periwound temperature was noted as No Abnormality. The periwound has tenderness on palpation. Assessment Active Problems ICD-10 J85.631 - Non-pressure chronic ulcer of right ankle limited to breakdown of skin I87.331 - Chronic venous hypertension (idiopathic) with ulcer and inflammation of right lower extremity Procedures Wound #1 Pre-procedure diagnosis of Wound #1 is a Vasculopathy located on the Right,Medial Ankle . There was a Skin/Subcutaneous Tissue Debridement ([id number]) debridement with total area of 3.4 sq cm performed  by Ricard Dillon, MD. with the following instrument(s): Curette to remove Non-Viable tissue/material including Exudate, Fat Layer (and Subcutaneous Tissue) Exposed,  Fibrin/Slough, and Subcutaneous after achieving pain control using Lidocaine 4% Topical Solution. A time out was conducted at 09:23, prior to the start of the procedure. A Minimum amount of bleeding was controlled with Pressure. The procedure was tolerated well with a pain level of 0 throughout and a pain level of 0 following the procedure. Post Debridement Measurements: 1.7cm length x 2cm width x 0.2cm depth; 0.534cm^3 volume. Character of Wound/Ulcer Post Debridement requires further debridement. Karen Bowman ([id number]) Post procedure Diagnosis Wound #1: Same as Pre-Procedure Plan Wound Cleansing: Wound #1 Right,Medial Ankle: Clean wound with Normal Saline. May shower with protection. - USE CAST PROTECTOR No tub bath. Anesthetic: Wound #1 Right,Medial Ankle: Topical Lidocaine 4% cream applied to wound bed prior to debridement Skin Barriers/Peri-Wound Care: Wound #1 Right,Medial Ankle: Triamcinolone Acetonide Ointment Primary Wound Dressing: Wound #1 Right,Medial Ankle: Santyl Ointment Secondary Dressing: Wound #1 Right,Medial Ankle: ABD pad Dry Gauze Dressing Change Frequency: Wound #1 Right,Medial Ankle: Change dressing every week Follow-up Appointments: Wound #1 Right,Medial Ankle: Return Appointment in 1 week. Edema Control: Wound #1 Right,Medial Ankle: 3 Layer Compression System - Right Lower Extremity Elevate legs to the level of the heart and pump ankles as often as possible Additional Orders / Instructions: Wound #1 Right,Medial Ankle: Increase protein intake. Activity as tolerated Medications-please add to medication list.: Wound #1 Right,Medial Ankle: Santyl Enzymatic Ointment Services and Therapies ordered were: Venous Duplex Doppler - right leg Karen Bowman ([id number]) #1 I elected to use Santyl to this wound and to use 3 layer compression. Based on the ABIs in our clinic and clinical exam I think she has sufficient arterial flow. #2 the  patient has chronic venous inflammation around this wound, I think she needs venous reflux studies to see if ablation of the right greater saphenous pain might be helpful in healing this area. #3 I do not believe there is any cellulitis, no cultures and no antibiotics were felt to be necessary #4 I think the edema is related to chronic venous reflux I saw no evidence of CHF at the bedside Electronic Signature(s) Signed: 08/27/2016 5:12:21 PM By: Linton Ham MD Entered By: Linton Ham on 08/27/2016 10:35:57 Karen Bowman ([id number]) -------------------------------------------------------------------------------- ROS/PFSH Details Patient Name: Karen Bowman Date of Service: 08/27/2016 8:00 AM Medical Record Patient Account Number: [id number] Number: Treating RN: Afful, RN, BSN, Rita 02-02-1941 [phone] y.o. Other Clinician: Date of Birth/Sex: Female) Treating ROBSON, MICHAEL Primary Care Provider: Diona Browner, Amy Provider/Extender: G Referring Provider: [name] Weeks in Treatment: 0 Information Obtained From Patient Wound History Do you currently have one or more open woundso Yes How many open wounds do you currently haveo 1 Approximately how long have you had your woundso 1 year Has your wound(s) ever healed and then re-openedo Yes Have you had any lab work done in the past montho No Have you tested positive for an antibiotic resistant organism (MRSA, VRE)o No Have you tested positive for osteomyelitis (bone infection)o No Have you had any tests for circulation on your legso No Have you had other problems associated with your woundso Infection, Swelling Eyes Complaints and Symptoms: Positive for: Glasses / Contacts Medical History: Negative for: Cataracts; Glaucoma; Optic Neuritis Cardiovascular Complaints and Symptoms: Positive for: LE edema Medical History: Positive for: Arrhythmia; Coronary Artery Disease; Peripheral Arterial Disease; Peripheral  Venous Disease Integumentary (Skin) Complaints and Symptoms: Positive for:  Wounds; Breakdown; Swelling Medical History: Negative for: History of Burn; History of pressure wounds Constitutional Symptoms (General Health) NAKEISHA, [place]EN[place] ([id number]) Complaints and Symptoms: No Complaints or Symptoms Ear/Nose/Mouth/Throat Complaints and Symptoms: No Complaints or Symptoms Medical History: Negative for: Chronic sinus problems/congestion; Middle ear problems Hematologic/Lymphatic Complaints and Symptoms: No Complaints or Symptoms Medical History: Negative for: Anemia; Hemophilia; Human Immunodeficiency Virus; Lymphedema; Sickle Cell Disease Respiratory Complaints and Symptoms: No Complaints or Symptoms Medical History: Negative for: Aspiration; Asthma; Chronic Obstructive Pulmonary Disease (COPD); Pneumothorax; Sleep Apnea; Tuberculosis Gastrointestinal Complaints and Symptoms: No Complaints or Symptoms Medical History: Negative for: Cirrhosis ; Colitis; Crohnos; Hepatitis A; Hepatitis B; Hepatitis C Endocrine Complaints and Symptoms: No Complaints or Symptoms Medical History: Negative for: Type I Diabetes; Type II Diabetes Genitourinary Complaints and Symptoms: No Complaints or Symptoms Medical History: Negative for: End Stage Renal Disease CHIMENE, SALO ([id number]) Immunological Complaints and Symptoms: No Complaints or Symptoms Medical History: Negative for: Lupus Erythematosus; Raynaudos; Scleroderma Musculoskeletal Complaints and Symptoms: No Complaints or Symptoms Medical History: Positive for: Osteoarthritis Neurologic Medical History: Positive for: Neuropathy Negative for: Dementia; Quadriplegia; Paraplegia; Seizure Disorder Oncologic Complaints and Symptoms: No Complaints or Symptoms Medical History: Negative for: Received Chemotherapy; Received Radiation Psychiatric Complaints and Symptoms: No Complaints or Symptoms Medical  History: Negative for: Anorexia/bulimia; Confinement Anxiety Immunizations Pneumococcal Vaccine: Received Pneumococcal Vaccination: No Family and Social History Cancer: Yes - Siblings, Father; Diabetes: No; Heart Disease: Yes - Father, Paternal Grandparents, Maternal Grandparents; Hereditary Spherocytosis: No; Hypertension: Yes - Mother, Father, Siblings; Kidney Disease: No; Lung Disease: Yes - Siblings; Seizures: Yes - Siblings; Stroke: No; Thyroid Problems: No; Tuberculosis: No; Never smoker; Marital Status - Married; Alcohol Use: Never; Caffeine Use: Moderate; Financial Concerns: No; Food, Clothing or Shelter Needs: No; Support System Lacking: No; Transportation Concerns: No; Advanced Directives: No; Living Will: No Electronic Signature(s) KAMIRAH, SHUGRUE ([id number]) Signed: 08/27/2016 3:29:18 PM By: Regan Lemming BSN, RN Signed: 08/27/2016 5:12:21 PM By: Linton Ham MD Entered By: Regan Lemming on 08/27/2016 08:31:43 Karen Bowman ([id number]) -------------------------------------------------------------------------------- SuperBill Details Patient Name: Karen Bowman Date of Service: 08/27/2016 Medical Record Patient Account Number: [id number] Number: Treating RN: Baruch Gouty, RN, BSN, Rita 1940/04/23 [phone] y.o. 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 Chronic venous hypertension (idiopathic) with ulcer and inflammation of right lower I87.331 extremity Facility Procedures CPT4 Code Description: 25366440 99214 - WOUND CARE VISIT-LEV 4 EST PT Modifier: Quantity: 1 CPT4 Code Description: 34742595 11042 - DEB SUBQ TISSUE 20 SQ CM/< ICD-10 Description Diagnosis L97.311 Non-pressure chronic ulcer of  right ankle limited t Modifier: o breakdown o Quantity: 1 f skin Physician Procedures CPT4: Description Modifier Quantity Code 6387564 33295 - WC PHYS LEVEL 4 - NEW PT 1 ICD-10 Description Diagnosis 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

## 2016-09-03 ENCOUNTER — Encounter: Payer: Medicare PPO | Attending: Internal Medicine | Admitting: Internal Medicine

## 2016-09-03 DIAGNOSIS — G629 Polyneuropathy, unspecified: Secondary | ICD-10-CM | POA: Insufficient documentation

## 2016-09-03 DIAGNOSIS — I87331 Chronic venous hypertension (idiopathic) with ulcer and inflammation of right lower extremity: Secondary | ICD-10-CM | POA: Insufficient documentation

## 2016-09-03 DIAGNOSIS — I2510 Atherosclerotic heart disease of native coronary artery without angina pectoris: Secondary | ICD-10-CM | POA: Insufficient documentation

## 2016-09-03 DIAGNOSIS — I480 Paroxysmal atrial fibrillation: Secondary | ICD-10-CM | POA: Diagnosis not present

## 2016-09-03 DIAGNOSIS — I739 Peripheral vascular disease, unspecified: Secondary | ICD-10-CM | POA: Diagnosis not present

## 2016-09-03 DIAGNOSIS — M1990 Unspecified osteoarthritis, unspecified site: Secondary | ICD-10-CM | POA: Diagnosis not present

## 2016-09-03 DIAGNOSIS — I5022 Chronic systolic (congestive) heart failure: Secondary | ICD-10-CM | POA: Diagnosis not present

## 2016-09-03 DIAGNOSIS — L309 Dermatitis, unspecified: Secondary | ICD-10-CM | POA: Diagnosis not present

## 2016-09-03 DIAGNOSIS — L97311 Non-pressure chronic ulcer of right ankle limited to breakdown of skin: Secondary | ICD-10-CM | POA: Insufficient documentation

## 2016-09-03 DIAGNOSIS — Z8673 Personal history of transient ischemic attack (TIA), and cerebral infarction without residual deficits: Secondary | ICD-10-CM | POA: Insufficient documentation

## 2016-09-03 DIAGNOSIS — Z7901 Long term (current) use of anticoagulants: Secondary | ICD-10-CM | POA: Insufficient documentation

## 2016-09-04 NOTE — Progress Notes (Signed)
CHERRYL, BABIN ([id number]) Visit Report for 09/03/2016 Arrival Information Details Patient Name: NARCISA, GANESH Date of Service: 09/03/2016 2:00 PM Medical Record Number: [id number] Patient Account Number: [id number] Date of Birth/Sex: [birth year] (76 y.o. Female) Treating RN: Afful, RN, BSN, Velva Harman Primary Care Hawley Michel: [name] Other Clinician: Referring Oda Placke: [name] Treating Zeynab Klett/Extender: Tito Dine in Treatment: 1 Visit Information History Since Last Visit All ordered tests and consults were completed: No Patient Arrived: Kasandra Knudsen Added or deleted any medications: No Arrival Time: 14:17 Any new allergies or adverse reactions: No Accompanied By: self Had a fall or experienced change in No Transfer Assistance: None activities of daily living that may affect Patient Identification Verified: Yes risk of falls: Secondary Verification Process Completed: Yes Signs or symptoms of abuse/neglect since last No Patient Requires Transmission-Based No visito Precautions: Hospitalized since last visit: No Patient Has Alerts: No Has Dressing in Place as Prescribed: Yes Pain Present Now: No Electronic Signature(s) Signed: 09/03/2016 4:40:28 PM By: Regan Lemming BSN, RN Entered By: Regan Lemming on 09/03/2016 14:17:55 [name] ([id number]) -------------------------------------------------------------------------------- Encounter Discharge Information Details Patient Name: [name] Date of Service: 09/03/2016 2:00 PM Medical Record Number: [id number] Patient Account Number: [id number] Date of Birth/Sex: 1940-07-15 (76 y.o. Female) Treating RN: Afful, RN, BSN, Velva Harman Primary Care Julious Langlois: [name] Other Clinician: Referring Maiana Hennigan: [name] Treating Rosalynn Sergent/Extender: Tito Dine in Treatment: 1 Encounter Discharge Information Items Discharge Pain Level: 0 Discharge Condition: Stable Ambulatory Status: Cane Discharge  Destination: Home Transportation: Private Auto Accompanied By: self Schedule Follow-up Appointment: Yes Medication Reconciliation completed Yes and provided to Patient/Care Jazzmen Restivo: Provided on Clinical Summary of Care: 09/03/2016 Form Type Recipient Paper Patient le Electronic Signature(s) Signed: 09/03/2016 3:06:29 PM By: Lorine Bears RCP, RRT, CHT Entered By: Lorine Bears on 09/03/2016 15:06:29 [name] ([id number]) -------------------------------------------------------------------------------- Lower Extremity Assessment Details Patient Name: [name] Date of Service: 09/03/2016 2:00 PM Medical Record Number: [id number] Patient Account Number: [id number] Date of Birth/Sex: [birth year] (76 y.o. Female) Treating RN: Afful, RN, BSN, [place] Primary Care Gawain Crombie: [name] Other Clinician: Referring Danna Casella: [name] Treating Lula Kolton/Extender: Ricard Dillon Weeks in Treatment: 1 Edema Assessment Assessed: [Left: Yes] [Right: No] Edema: [Left: No] [Right: No] Calf Left: Right: Point of Measurement: 35 cm From Medial Instep cm 35 cm Ankle Left: Right: Point of Measurement: 9 cm From Medial Instep cm 22 cm Vascular Assessment Claudication: Claudication Assessment [Right:None] Pulses: Dorsalis Pedis Palpable: [Right:Yes] Doppler Audible: [Right:Yes] Posterior Tibial Extremity colors, hair growth, and conditions: Extremity Color: [Right:Mottled] Hair Growth on Extremity: [Right:No] Temperature of Extremity: [Right:Warm] Capillary Refill: [Right:< 3 seconds] Electronic Signature(s) Signed: 09/03/2016 4:40:28 PM By: Regan Lemming BSN, RN Entered By: Regan Lemming on 09/03/2016 14:20:12 [name] ([id number]) -------------------------------------------------------------------------------- Multi Wound Chart Details Patient Name: [name] Date of Service: 09/03/2016 2:00 PM Medical Record Number:  [id number] Patient Account Number: [id number] Date of Birth/Sex: 04-02-1940 (76 y.o. Female) Treating RN: Baruch Gouty, RN, BSN, Velva Harman Primary Care Linday Rhodes: [name] Other Clinician: Referring Vika Buske: [name] Treating Demara Lover/Extender: Ricard Dillon Weeks in Treatment: 1 Vital Signs Height(in): 65 Pulse(bpm): 67 Weight(lbs): 132 Blood Pressure 141/45 (mmHg): Body Mass Index(BMI): 22 Temperature(F): 98.1 Respiratory Rate 16 (breaths/min): Photos: [N/A:N/A] Wound Location: Right Ankle - Medial N/A N/A Wounding Event: Gradually Appeared N/A N/A Primary Etiology: Vasculopathy N/A N/A Comorbid History: Arrhythmia, Coronary N/A N/A Artery Disease, Peripheral Arterial Disease, Peripheral Venous Disease, Osteoarthritis, Neuropathy Date Acquired: 09/30/2015 N/A N/A Weeks of Treatment:  1 N/A N/A Wound Status: Open N/A N/A Measurements L x W x D 1.2x1.7x0.1 N/A N/A (cm) Area (cm) : 1.602 N/A N/A Volume (cm) : 0.16 N/A N/A % Reduction in Area: 40.00% N/A N/A KYANNA, MAHRT ([id number]) % Reduction in Volume: 40.10% N/A N/A Classification: Full Thickness Without N/A N/A Exposed Support Structures Exudate Amount: Medium N/A N/A Exudate Type: Serosanguineous N/A N/A Exudate Color: red, brown N/A N/A Wound Margin: Distinct, outline attached N/A N/A Granulation Amount: Small (1-33%) N/A N/A Granulation Quality: Pink N/A N/A Necrotic Amount: Large (67-100%) N/A N/A Exposed Structures: Fat Layer (Subcutaneous N/A N/A Tissue) Exposed: Yes Fascia: No Tendon: No Muscle: No Joint: No Bone: No Epithelialization: None N/A N/A Debridement: Debridement (50277- N/A N/A 11047) Pre-procedure 14:36 N/A N/A Verification/Time Out Taken: Pain Control: Lidocaine 4% Topical N/A N/A Solution Tissue Debrided: Fibrin/Slough, Fat, N/A N/A Exudates, Subcutaneous Level: Skin/Subcutaneous N/A N/A Tissue Debridement Area (sq 2.04 N/A N/A cm): Instrument: Curette N/A  N/A Bleeding: Minimum N/A N/A Hemostasis Achieved: Pressure N/A N/A Procedural Pain: 0 N/A N/A Post Procedural Pain: 0 N/A N/A Debridement Treatment Procedure was tolerated N/A N/A Response: well Post Debridement 1.2x1.7x0.1 N/A N/A Measurements L x W x D (cm) Post Debridement 0.16 N/A N/A Volume: (cm) Periwound Skin Texture: Excoriation: Yes N/A N/A Induration: No Callus: No Crepitus: No Rash: No Scarring: No JANNETTE, COTHAM ([id number]) Periwound Skin Maceration: No N/A N/A Moisture: Dry/Scaly: No Periwound Skin Color: Erythema: Yes N/A N/A Hemosiderin Staining: Yes Mottled: Yes Atrophie Blanche: No Cyanosis: No Ecchymosis: No Pallor: No Rubor: No Erythema Location: Circumferential N/A N/A Temperature: No Abnormality N/A N/A Tenderness on Yes N/A N/A Palpation: Wound Preparation: Ulcer Cleansing: N/A N/A Rinsed/Irrigated with Saline, Other: surg scrub and water Topical Anesthetic Applied: Other: lidocaine 4% Procedures Performed: Debridement N/A N/A Treatment Notes Wound #1 (Right, Medial Ankle) 1. Cleansed with: Cleanse wound with antibacterial soap and water 3. Peri-wound Care: Barrier cream Moisturizing lotion 4. Dressing Applied: Aquacel Ag 5. Secondary Dressing Applied ABD Pad Dry Gauze 7. Secured with 3 Layer Compression System - Right Lower Extremity Notes TCA Electronic Signature(s) Signed: 09/03/2016 5:27:39 PM By: Linton Ham MD Previous Signature: 09/03/2016 4:40:28 PM Version By: Regan Lemming BSN, RN Entered By: Linton Ham on 09/03/2016 16:49:47 [name] ([name] ([id number]) -------------------------------------------------------------------------------- [place] Details Patient Name: [name] Date of Service: 09/03/2016 2:00 PM Medical Record Number: [id number] Patient Account Number: [id number] Date of Birth/Sex: 08/21/1940 (76 y.o. Female) Treating RN: Afful,  RN, BSN, Velva Harman Primary Care Massimo Hartland: [name] Other Clinician: Referring Janayia Burggraf: [name] Treating Irelynn Schermerhorn/Extender: Tito Dine in Treatment: 1 Active Inactive ` Orientation to the Wound Care Program Nursing Diagnoses: Knowledge deficit related to the wound healing center program Goals: Patient/caregiver will verbalize understanding of the [place] Program Date Initiated: 08/27/2016 Target Resolution Date: 01/27/2017 Goal Status: Active Interventions: Provide education on orientation to the wound center Notes: ` Venous Leg Ulcer Nursing Diagnoses: Knowledge deficit related to disease process and management Potential for venous Insuffiency (use before diagnosis confirmed) Goals: Non-invasive venous studies are completed as ordered Date Initiated: 08/27/2016 Target Resolution Date: 01/27/2017 Goal Status: Active Patient will maintain optimal edema control Date Initiated: 08/27/2016 Target Resolution Date: 01/27/2017 Goal Status: Active Patient/caregiver will verbalize understanding of disease process and disease management Date Initiated: 08/27/2016 Target Resolution Date: 01/27/2017 Goal Status: Active Verify adequate tissue perfusion prior to therapeutic compression application Date Initiated: 08/27/2016 Target Resolution Date: 01/27/2017 Goal Status: Active Ternes,  Gerald Leitz ([id number]) Interventions: Assess peripheral edema status every visit. Compression as ordered Provide education on venous insufficiency Treatment Activities: Non-invasive vascular studies : 08/27/2016 Test ordered outside of clinic : 08/27/2016 Therapeutic compression applied : 08/27/2016 Venous Duplex Doppler : 08/27/2016 Notes: ` Wound/Skin Impairment Nursing Diagnoses: Impaired tissue integrity Knowledge deficit related to ulceration/compromised skin integrity Goals: Patient/caregiver will verbalize understanding of skin care regimen Date Initiated:  08/27/2016 Target Resolution Date: 01/27/2017 Goal Status: Active Ulcer/skin breakdown will have a volume reduction of 30% by week 4 Date Initiated: 08/27/2016 Target Resolution Date: 01/27/2017 Goal Status: Active Ulcer/skin breakdown will have a volume reduction of 50% by week 8 Date Initiated: 08/27/2016 Target Resolution Date: 01/27/2017 Goal Status: Active Ulcer/skin breakdown will have a volume reduction of 80% by week 12 Date Initiated: 08/27/2016 Target Resolution Date: 01/27/2017 Goal Status: Active Ulcer/skin breakdown will heal within 14 weeks Date Initiated: 08/27/2016 Target Resolution Date: 01/27/2017 Goal Status: Active Interventions: Assess patient/caregiver ability to obtain necessary supplies Assess patient/caregiver ability to perform ulcer/skin care regimen upon admission and as needed Assess ulceration(s) every visit Provide education on ulcer and skin care Treatment Activities: Skin care regimen initiated : 08/27/2016 [name] ([id number]) Topical wound management initiated : 08/27/2016 Notes: Electronic Signature(s) Signed: 09/03/2016 4:40:28 PM By: Regan Lemming BSN, RN Entered By: Regan Lemming on 09/03/2016 14:30:22 [name] ([id number]) -------------------------------------------------------------------------------- Pain Assessment Details Patient Name: [name] Date of Service: 09/03/2016 2:00 PM Medical Record Number: [id number] Patient Account Number: [id number] Date of Birth/Sex: [birth year] (76 y.o. Female) Treating RN: Afful, RN, BSN, Velva Harman Primary Care Stark Aguinaga: [name] Other Clinician: Referring Camden Mazzaferro: [name] Treating Tiffani Kadow/Extender: Ricard Dillon Weeks in Treatment: 1 Active Problems Location of Pain Severity and Description of Pain Patient Has Paino No Site Locations With Dressing Change: No Pain Management and Medication Current Pain Management: Electronic Signature(s) Signed: 09/03/2016 4:40:28  PM By: Regan Lemming BSN, RN Entered By: Regan Lemming on 09/03/2016 14:18:02 [name] ([id number]) -------------------------------------------------------------------------------- Patient/Caregiver Education Details Patient Name: [name] Date of Service: 09/03/2016 2:00 PM Medical Record Patient Account Number: [id number] Number: Treating RN: Baruch Gouty, RN, BSN, Rita 1940/04/10 [id number] y.o. Other Clinician: Date of Birth/Gender: Female) Treating ROBSON, MICHAEL Primary Care Physician: [name] Physician/Extender: G Referring Physician: Leslye Peer in Treatment: 1 Education Assessment Education Provided To: Patient Education Topics Provided Venous: Methods: Explain/Verbal Responses: State content correctly Welcome To [facility]: Methods: Explain/Verbal Responses: State content correctly Wound/Skin Impairment: Methods: Explain/Verbal Responses: State content correctly Electronic Signature(s) Signed: 09/03/2016 4:40:28 PM By: Regan Lemming BSN, RN Entered By: Regan Lemming on 09/03/2016 15:02:10 [name] ([id number]) -------------------------------------------------------------------------------- Wound Assessment Details Patient Name: [name] Date of Service: 09/03/2016 2:00 PM Medical Record Number: [id number] Patient Account Number: [id number] Date of Birth/Sex: 04/24/1940 (76 y.o. Female) Treating RN: Afful, RN, BSN, [place] Primary Care Tajah Noguchi: [name] Other Clinician: Referring Latora Quarry: [name] Treating Nikki Rusnak/Extender: Ricard Dillon Weeks in Treatment: 1 Wound Status Wound Number: 1 Primary Vasculopathy Etiology: Wound Location: Right Ankle - Medial Wound Open Wounding Event: Gradually Appeared Status: Date Acquired: 09/30/2015 Comorbid Arrhythmia, Coronary Artery Disease, Weeks Of Treatment: 1 History: Peripheral Arterial Disease, Peripheral Clustered Wound: No Venous Disease,  Osteoarthritis, Neuropathy Photos Photo Uploaded By: Regan Lemming on 09/03/2016 16:38:45 Wound Measurements Length: (cm) 1.2 Width: (cm) 1.7 Depth: (cm) 0.1 Area: (cm) 1.602 Volume: (cm) 0.16 % Reduction in Area: 40% % Reduction in Volume: 40.1% Epithelialization: None Tunneling: No Undermining: No Wound Description  Full Thickness Without Exposed Classification: Support Structures Wound Margin: Distinct, outline attached [name] ([id number]) Foul Odor After Cleansing: No Slough/Fibrino Yes Exudate Medium Amount: Exudate Type: Serosanguineous Exudate Color: red, brown Wound Bed Granulation Amount: Small (1-33%) Exposed Structure Granulation Quality: Pink Fascia Exposed: No Necrotic Amount: Large (67-100%) Fat Layer (Subcutaneous Tissue) Exposed: Yes Necrotic Quality: Adherent Slough Tendon Exposed: No Muscle Exposed: No Joint Exposed: No Bone Exposed: No Periwound Skin Texture Texture Color No Abnormalities Noted: No No Abnormalities Noted: No Callus: No Atrophie Blanche: No Crepitus: No Cyanosis: No Excoriation: Yes Ecchymosis: No Induration: No Erythema: Yes Rash: No Erythema Location: Circumferential Scarring: No Hemosiderin Staining: Yes Mottled: Yes Moisture Pallor: No No Abnormalities Noted: No Rubor: No Dry / Scaly: No Maceration: No Temperature / Pain Temperature: No Abnormality Tenderness on Palpation: Yes Wound Preparation Ulcer Cleansing: Rinsed/Irrigated with Saline, Other: surg scrub and water, Topical Anesthetic Applied: Other: lidocaine 4%, Treatment Notes Wound #1 (Right, Medial Ankle) 1. Cleansed with: Cleanse wound with antibacterial soap and water 3. Peri-wound Care: Barrier cream Moisturizing lotion 4. Dressing Applied: Aquacel Ag 5. Secondary Dressing Applied ABD Pad Dry Gauze TERECIA, PLAUT ([id number]) 7. Secured with 3 Layer Compression System - Right Lower Extremity Notes TCA Electronic  Signature(s) Signed: 09/03/2016 4:40:28 PM By: Regan Lemming BSN, RN Entered By: Regan Lemming on 09/03/2016 14:30:09 [name] ([id number]) -------------------------------------------------------------------------------- Vitals Details Patient Name: [name] Date of Service: 09/03/2016 2:00 PM Medical Record Number: [id number] Patient Account Number: [id number] Date of Birth/Sex: 1941/01/20 (76 y.o. Female) Treating RN: Afful, RN, BSN, [place] Primary Care Diamond Jentz: [name] Other Clinician: Referring Darcy Barbara: [name] Treating Catherine Oak/Extender: Tito Dine in Treatment: 1 Vital Signs Time Taken: 14:20 Temperature (F): 98.1 Height (in): 65 Pulse (bpm): 67 Weight (lbs): 132 Respiratory Rate (breaths/min): 16 Body Mass Index (BMI): 22 Blood Pressure (mmHg): 141/45 Reference Range: 80 - 120 mg / dl Electronic Signature(s) Signed: 09/03/2016 4:40:28 PM By: Regan Lemming BSN, RN Entered By: Regan Lemming on 09/03/2016 14:20:59

## 2016-09-05 NOTE — Progress Notes (Signed)
CHALISE, PE ([id number]) Visit Report for 09/03/2016 Chief Complaint Document Details Patient Name: Karen Bowman, Karen Bowman Date of Service: 09/03/2016 2:00 PM Medical Record Patient Account Number: [id number] Number: Treating RN: Baruch Gouty, RN, BSN, Rita [birth year] [phone] y.o. Other Clinician: Date of Birth/Sex: Female) Treating ROBSON, MICHAEL Primary Care Provider: [name] Provider/Extender: G Referring Provider: Leslye Peer in Treatment: 1 Information Obtained from: Patient Chief Complaint 08/27/16; patient is here for review of a chronic wound on the right medial ankle that has been present for one year Electronic Signature(s) Signed: 09/03/2016 5:27:39 PM By: Linton Ham MD Entered By: Linton Ham on 09/03/2016 16:50:06 Karen Bowman ([id number]) -------------------------------------------------------------------------------- Debridement Details Patient Name: Karen Bowman Date of Service: 09/03/2016 2:00 PM Medical Record Patient Account Number: [id number] Number: Treating RN: Baruch Gouty, RN, BSN, Rita [birth year] [phone] y.o. Other Clinician: Date of Birth/Sex: Female) Treating ROBSON, MICHAEL Primary Care Provider: Diona Browner, Amy Provider/Extender: G Referring Provider: Leslye Peer in Treatment: 1 Debridement Performed for Wound #1 Right,Medial Ankle Assessment: Performed By: Physician Ricard Dillon, MD Debridement: Debridement Pre-procedure Verification/Time Out Yes - 14:36 Taken: Start Time: 14:36 Pain Control: Lidocaine 4% Topical Solution Level: Skin/Subcutaneous Tissue Total Area Debrided (L x 1.2 (cm) x 1.7 (cm) = 2.04 (cm) W): Tissue and other Non-Viable, Exudate, Fat, Fibrin/Slough, Subcutaneous material debrided: Instrument: Curette Bleeding: Minimum Hemostasis Achieved: Pressure End Time: 14:38 Procedural Pain: 0 Post Procedural Pain: 0 Response to Treatment: Procedure was tolerated well Post  Debridement Measurements of Total Wound Length: (cm) 1.2 Width: (cm) 1.7 Depth: (cm) 0.1 Volume: (cm) 0.16 Character of Wound/Ulcer Post Stable Debridement: Post Procedure Diagnosis Same as Pre-procedure Electronic Signature(s) Signed: 09/03/2016 5:27:39 PM By: Linton Ham MD Signed: 09/04/2016 4:15:19 PM By: Regan Lemming BSN, RN Previous Signature: 09/03/2016 4:40:28 PM Version By: Regan Lemming BSN, RN Karen Bowman ([id number]) Entered By: Linton Ham on 09/03/2016 16:49:57 Karen Bowman ([id number]) -------------------------------------------------------------------------------- HPI Details Patient Name: Karen Bowman Date of Service: 09/03/2016 2:00 PM Medical Record Patient Account Number: [id number] Number: Treating RN: Baruch Gouty, RN, BSN, Rita 1941/01/14 [phone] y.o. Other Clinician: Date of Birth/Sex: Female) Treating ROBSON, MICHAEL Primary Care Provider: [name] Provider/Extender: G Referring Provider: Leslye Peer in Treatment: 1 History of Present Illness HPI Description: 08/27/16; his is a 76 year old woman who has been dealing with a ulcer on her right medial ankle for about a year. She is uncertain about how this actually opened however it is not really changed that much in quite some time. She has a history of bleeding per Deniece Ree these on the left more remotely but no other ulcer history. She is not a diabetic but she does have chronic right leg greater than left leg swelling. Looking through [employer] she has a history of eczema, systolic congestive heart failure left CVA and paroxysmal A. fib on Eliquis. She has been using Neosporin and more recently Bactroban without improvement. She saw her primary physician who referred her here, notes they're suggesting Coban and gauze. Dimensions of the wounds 1.7 x 2 x 0.1. ABIs in this clinic were 1.0 bilaterally. 09/03/16; wound dimensions are smaller. Most of the wound still covered in  an adherent necrotic surface. We have been using Santyl under compression. Patient complains of pain and discomfort Electronic Signature(s) Signed: 09/03/2016 5:27:39 PM By: Linton Ham MD Entered By: Linton Ham on 09/03/2016 16:50:45 Karen Bowman ([id number]) -------------------------------------------------------------------------------- Physical Exam Details Patient Name: Karen Bowman Date of Service: 09/03/2016 2:00 PM Medical Record Patient  Account Number: [id number] Number: Treating RN: Baruch Gouty, RN, BSN, Rita 1940/06/30 [phone] y.o. Other Clinician: Date of Birth/Sex: Female) Treating ROBSON, MICHAEL Primary Care Provider: [name] Provider/Extender: G Referring Provider: [name] Weeks in Treatment: 1 Constitutional Sitting or standing Blood Pressure is within target range for patient.. Pulse regular and within target range for patient.Marland Kitchen Respirations regular, non-labored and within target range.. Temperature is normal and within the target range for the patient.Marland Kitchen appears in no distress. Notes Wound exam; the patient has a regular but superficial wound on the right medial ankle. Once again the patient requires a difficult debridement with a #3 curet to get to a viable surface. There is no evidence of surrounding infection. Electronic Signature(s) Signed: 09/03/2016 5:27:39 PM By: Linton Ham MD Entered By: Linton Ham on 09/03/2016 16:52:28 Karen Bowman ([id number]) -------------------------------------------------------------------------------- Physician Orders Details Patient Name: Karen Bowman Date of Service: 09/03/2016 2:00 PM Medical Record Patient Account Number: [id number] Number: Treating RN: Baruch Gouty, RN, BSN, Rita 07-03-1940 [phone] y.o. Other Clinician: Date of Birth/Sex: Female) Treating ROBSON, MICHAEL Primary Care Provider: [name] Provider/Extender: G Referring Provider: Leslye Peer in  Treatment: 1 Verbal / Phone Orders: No Diagnosis Coding Wound Cleansing Wound #1 Right,Medial Ankle o Clean wound with Normal Saline. o May shower with protection. - USE CAST PROTECTOR o No tub bath. Anesthetic Wound #1 Right,Medial Ankle o Topical Lidocaine 4% cream applied to wound bed prior to debridement Skin Barriers/Peri-Wound Care Wound #1 Right,Medial Ankle o Triamcinolone Acetonide Ointment Primary Wound Dressing Wound #1 Right,Medial Ankle o Santyl Ointment Secondary Dressing Wound #1 Right,Medial Ankle o ABD pad o Dry Gauze Dressing Change Frequency Wound #1 Right,Medial Ankle o Change dressing every week Follow-up Appointments Wound #1 Right,Medial Ankle o Return Appointment in 1 week. Edema Control Wound #1 Right,Medial Ankle GLYNNIS, GAVEL ([id number]) o 3 Layer Compression System - Right Lower Extremity o Elevate legs to the level of the heart and pump ankles as often as possible Additional Orders / Instructions Wound #1 Right,Medial Ankle o Increase protein intake. o Activity as tolerated Medications-please add to medication list. Wound #1 Right,Medial Ankle o Santyl Enzymatic Ointment Electronic Signature(s) Signed: 09/03/2016 4:40:28 PM By: Regan Lemming BSN, RN Signed: 09/03/2016 5:27:39 PM By: Linton Ham MD Entered By: Regan Lemming on 09/03/2016 14:40:49 Karen Bowman ([id number]) -------------------------------------------------------------------------------- Problem List Details Patient Name: Karen Bowman Date of Service: 09/03/2016 2:00 PM Medical Record Patient Account Number: [id number] Number: Treating RN: Baruch Gouty, RN, BSN, Rita November 18, 1940 [phone] y.o. Other Clinician: Date of Birth/Sex: Female) Treating ROBSON, MICHAEL Primary Care Provider: [name] Provider/Extender: G Referring Provider: Leslye Peer in Treatment: 1 Active Problems ICD-10 Encounter Code Description Active  Date Diagnosis L97.311 Non-pressure chronic ulcer of right ankle limited to 08/27/2016 Yes breakdown of skin I87.331 Chronic venous hypertension (idiopathic) with ulcer and 08/27/2016 Yes inflammation of right lower extremity Inactive Problems Resolved Problems Electronic Signature(s) Signed: 09/03/2016 5:27:39 PM By: Linton Ham MD Entered By: Linton Ham on 09/03/2016 16:49:31 Karen Bowman ([id number]) -------------------------------------------------------------------------------- Progress Note Details Patient Name: Karen Bowman Date of Service: 09/03/2016 2:00 PM Medical Record Patient Account Number: [id number] Number: Treating RN: Baruch Gouty, RN, BSN, Rita Sep 23, 1940 [phone] y.o. Other Clinician: Date of Birth/Sex: Female) Treating ROBSON, MICHAEL Primary Care Provider: [name] Provider/Extender: G Referring Provider: Leslye Peer in Treatment: 1 Subjective Chief Complaint Information obtained from Patient 08/27/16; patient is here for review of a chronic wound on the right medial ankle that has been  present for one year History of Present Illness (HPI) 08/27/16; his is a 76 year old woman who has been dealing with a ulcer on her right medial ankle for about a year. She is uncertain about how this actually opened however it is not really changed that much in quite some time. She has a history of bleeding per Deniece Ree these on the left more remotely but no other ulcer history. She is not a diabetic but she does have chronic right leg greater than left leg swelling. Looking through [employer] she has a history of eczema, systolic congestive heart failure left CVA and paroxysmal A. fib on Eliquis. She has been using Neosporin and more recently Bactroban without improvement. She saw her primary physician who referred her here, notes they're suggesting Coban and gauze. Dimensions of the wounds 1.7 x 2 x 0.1. ABIs in this clinic were 1.0  bilaterally. 09/03/16; wound dimensions are smaller. Most of the wound still covered in an adherent necrotic surface. We have been using Santyl under compression. Patient complains of pain and discomfort Objective Constitutional Sitting or standing Blood Pressure is within target range for patient.. Pulse regular and within target range for patient.Marland Kitchen Respirations regular, non-labored and within target range.. Temperature is normal and within the target range for the patient.Marland Kitchen appears in no distress. Vitals Time Taken: 2:20 PM, Height: 65 in, Weight: 132 lbs, BMI: 22, Temperature: 98.1 F, Pulse: 67 bpm, Respiratory Rate: 16 breaths/min, Blood Pressure: 141/45 mmHg. Karen Bowman ([id number]) General Notes: Wound exam; the patient has a regular but superficial wound on the right medial ankle. Once again the patient requires a difficult debridement with a #3 curet to get to a viable surface. There is no evidence of surrounding infection. Integumentary (Hair, Skin) Wound #1 status is Open. Original cause of wound was Gradually Appeared. The wound is located on the Right,Medial Ankle. The wound measures 1.2cm length x 1.7cm width x 0.1cm depth; 1.602cm^2 area and 0.16cm^3 volume. There is Fat Layer (Subcutaneous Tissue) Exposed exposed. There is no tunneling or undermining noted. There is a medium amount of serosanguineous drainage noted. The wound margin is distinct with the outline attached to the wound base. There is small (1-33%) pink granulation within the wound bed. There is a large (67-100%) amount of necrotic tissue within the wound bed including Adherent Slough. The periwound skin appearance exhibited: Excoriation, Hemosiderin Staining, Mottled, Erythema. The periwound skin appearance did not exhibit: Callus, Crepitus, Induration, Rash, Scarring, Dry/Scaly, Maceration, Atrophie Blanche, Cyanosis, Ecchymosis, Pallor, Rubor. The surrounding wound skin color is noted with erythema which  is circumferential. Periwound temperature was noted as No Abnormality. The periwound has tenderness on palpation. Assessment Active Problems ICD-10 A07.622 - Non-pressure chronic ulcer of right ankle limited to breakdown of skin I87.331 - Chronic venous hypertension (idiopathic) with ulcer and inflammation of right lower extremity Procedures Wound #1 Pre-procedure diagnosis of Wound #1 is a Vasculopathy located on the Right,Medial Ankle . There was a Skin/Subcutaneous Tissue Debridement ([id number]) debridement with total area of 2.04 sq cm performed by Ricard Dillon, MD. with the following instrument(s): Curette to remove Non-Viable tissue/material including Exudate, Fat Layer (and Subcutaneous Tissue) Exposed, Fibrin/Slough, and Subcutaneous after achieving pain control using Lidocaine 4% Topical Solution. A time out was conducted at 14:36, prior to the start of the procedure. A Minimum amount of bleeding was controlled with Pressure. The procedure was tolerated well with a pain level of 0 throughout and a pain level of 0 following the procedure. Post Debridement Measurements:  1.2cm length x 1.7cm width x 0.1cm depth; 0.16cm^3 volume. Character of Wound/Ulcer Post Debridement is stable. Post procedure Diagnosis Wound #1: Same as Pre-Procedure RIM, [facility] ([id number]) Plan Wound Cleansing: Wound #1 Right,Medial Ankle: Clean wound with Normal Saline. May shower with protection. - USE CAST PROTECTOR No tub bath. Anesthetic: Wound #1 Right,Medial Ankle: Topical Lidocaine 4% cream applied to wound bed prior to debridement Skin Barriers/Peri-Wound Care: Wound #1 Right,Medial Ankle: Triamcinolone Acetonide Ointment Primary Wound Dressing: Wound #1 Right,Medial Ankle: Santyl Ointment Secondary Dressing: Wound #1 Right,Medial Ankle: ABD pad Dry Gauze Dressing Change Frequency: Wound #1 Right,Medial Ankle: Change dressing every week Follow-up Appointments: Wound #1  Right,Medial Ankle: Return Appointment in 1 week. Edema Control: Wound #1 Right,Medial Ankle: 3 Layer Compression System - Right Lower Extremity Elevate legs to the level of the heart and pump ankles as often as possible Additional Orders / Instructions: Wound #1 Right,Medial Ankle: Increase protein intake. Activity as tolerated Medications-please add to medication list.: Wound #1 Right,Medial Ankle: Santyl Enzymatic Ointment #1 Santyl, ABd to continue under compression. I hope to have the opportunity to change to perhaps collagen next week MARYAN, SIVAK ([id number]) #2 the patient tells me her reflux studies are not booked until early July, but she is on a cancellation waiting list Electronic Signature(s) Signed: 09/03/2016 5:27:39 PM By: Linton Ham MD Entered By: Linton Ham on 09/03/2016 16:55:54 Karen Bowman ([id number]) -------------------------------------------------------------------------------- [place] Details Patient Name: Karen Bowman Date of Service: 09/03/2016 Medical Record Patient Account Number: [id number] Number: Treating RN: Baruch Gouty, RN, BSN, Rita 12-21-1940 [phone] y.o. Other Clinician: Date of Birth/Sex: Female) Treating ROBSON, MICHAEL Primary Care Provider: [name] Provider/Extender: G Referring Provider: [name] Weeks in Treatment: 1 Diagnosis Coding ICD-10 Codes Code Description D62.229 Non-pressure chronic ulcer of right ankle limited to breakdown of skin Chronic venous hypertension (idiopathic) with ulcer and inflammation of right lower I87.331 extremity Facility Procedures CPT4 Code Description: [id number] 11042 - DEB SUBQ TISSUE 20 SQ CM/< ICD-10 Description Diagnosis L97.311 Non-pressure chronic ulcer of right ankle limited t Modifier: o breakdown o Quantity: 1 f skin Physician Procedures CPT4 Code Description: [id number] - WC PHYS SUBQ TISS 20 SQ CM ICD-10 Description Diagnosis H63.149 Non-pressure  chronic ulcer of right ankle limited t Modifier: o breakdown o Quantity: 1 f skin Electronic Signature(s) Signed: 09/03/2016 5:27:39 PM By: Linton Ham MD Entered By: Linton Ham on 09/03/2016 16:56:10

## 2016-09-10 ENCOUNTER — Encounter: Payer: Medicare PPO | Admitting: Internal Medicine

## 2016-09-10 DIAGNOSIS — I87331 Chronic venous hypertension (idiopathic) with ulcer and inflammation of right lower extremity: Secondary | ICD-10-CM | POA: Diagnosis not present

## 2016-09-11 NOTE — Progress Notes (Signed)
Karen Bowman, Karen Bowman ([id number]) Visit Report for 09/10/2016 Arrival Information Details Patient Name: TERRACE, CHIEM Date of Service: 09/10/2016 10:15 AM Medical Record Number: [id number] Patient Account Number: [id number] Date of Birth/Sex: [birth year] (76 y.o. Female) Treating RN: Afful, RN, BSN, Velva Harman Primary Care Zara Wendt: [name] Other Clinician: Referring Liliya Fullenwider: [name] Treating Chantavia Bazzle/Extender: Tito Dine in Treatment: 2 Visit Information History Since Last Visit All ordered tests and consults were completed: No Patient Arrived: Karen Bowman Added or deleted any medications: No Arrival Time: 10:17 Any new allergies or adverse reactions: No Accompanied By: self Signs or symptoms of abuse/neglect since last No Transfer Assistance: None visito Patient Identification Verified: Yes Hospitalized since last visit: No Secondary Verification Process Completed: Yes Has Dressing in Place as Prescribed: Yes Patient Requires Transmission-Based No Has Compression in Place as Prescribed: Yes Precautions: Pain Present Now: No Patient Has Alerts: No Electronic Signature(s) Signed: 09/10/2016 4:53:20 PM By: Regan Lemming BSN, RN Entered By: Regan Lemming on 09/10/2016 10:19:28 Karen Bowman ([id number]) -------------------------------------------------------------------------------- Encounter Discharge Information Details Patient Name: Karen Bowman Date of Service: 09/10/2016 10:15 AM Medical Record Number: [id number] Patient Account Number: [id number] Date of Birth/Sex: 11/27/1940 (76 y.o. Female) Treating RN: Afful, RN, BSN, Velva Harman Primary Care Keelon Zurn: [name] Other Clinician: Referring Ardine Iacovelli: [name] Treating Alcario Tinkey/Extender: Tito Dine in Treatment: 2 Encounter Discharge Information Items Discharge Pain Level: 0 Discharge Condition: Stable Ambulatory Status: Cane Discharge Destination: Home Transportation: Private  Auto Accompanied By: self Schedule Follow-up Appointment: No Medication Reconciliation completed No and provided to Patient/Care Cheresa Siers: Provided on Clinical Summary of Care: 09/10/2016 Form Type Recipient Paper Patient LE Electronic Signature(s) Signed: 09/10/2016 10:55:41 AM By: Ruthine Dose Entered By: Ruthine Dose on 09/10/2016 10:55:40 Karen Bowman ([id number]) -------------------------------------------------------------------------------- Lower Extremity Assessment Details Patient Name: Karen Bowman Date of Service: 09/10/2016 10:15 AM Medical Record Number: [id number] Patient Account Number: [id number] Date of Birth/Sex: 1940/11/05 (76 y.o. Female) Treating RN: Afful, RN, BSN, Velva Harman Primary Care Chayton Murata: [name] Other Clinician: Referring Neytiri Asche: [name] Treating Branae Crail/Extender: Ricard Dillon Weeks in Treatment: 2 Edema Assessment Assessed: [Left: No] [Right: No] E[Left: dema] [Right: :] Calf Left: Right: Point of Measurement: 35 cm From Medial Instep cm 34.7 cm Ankle Left: Right: Point of Measurement: 9 cm From Medial Instep cm 22 cm Vascular Assessment Claudication: Claudication Assessment [Right:None] Pulses: Dorsalis Pedis Palpable: [Right:Yes] Posterior Tibial Extremity colors, hair growth, and conditions: Extremity Color: [Right:Mottled] Hair Growth on Extremity: [Right:No] Temperature of Extremity: [Right:Warm] Capillary Refill: [Right:< 3 seconds] Electronic Signature(s) Signed: 09/10/2016 4:53:20 PM By: Regan Lemming BSN, RN Entered By: Regan Lemming on 09/10/2016 10:20:01 Karen Bowman ([id number]) -------------------------------------------------------------------------------- Multi Wound Chart Details Patient Name: Karen Bowman Date of Service: 09/10/2016 10:15 AM Medical Record Number: [id number] Patient Account Number: [id number] Date of Birth/Sex: 06/02/1940 (76 y.o. Female) Treating RN: Baruch Gouty, RN, BSN,  Velva Harman Primary Care Ayde Record: [name] Other Clinician: Referring Zakariyya Helfman: [name] Treating Saamiya Jeppsen/Extender: Ricard Dillon Weeks in Treatment: 2 Vital Signs Height(in): 65 Pulse(bpm): 57 Weight(lbs): 132 Blood Pressure 110/85 (mmHg): Body Mass Index(BMI): 22 Temperature(F): 98.2 Respiratory Rate 16 (breaths/min): Photos: [1:No Photos] [N/A:N/A] Wound Location: [1:Right Ankle - Medial] [N/A:N/A] Wounding Event: [1:Gradually Appeared] [N/A:N/A] Primary Etiology: [1:Vasculopathy] [N/A:N/A] Comorbid History: [1:Arrhythmia, Coronary Artery Disease, Peripheral Arterial Disease, Peripheral Venous Disease, Osteoarthritis, Neuropathy] [N/A:N/A] Date Acquired: [1:09/30/2015] [N/A:N/A] Weeks of Treatment: [1:2] [N/A:N/A] Wound Status: [1:Open] [N/A:N/A] Measurements L x W x D 1.1x1.3x0.1 [N/A:N/A] (cm) Area (cm) : [1:1.123] [N/A:N/A] Volume (cm) : [  1:0.112] [N/A:N/A] % Reduction in Area: [1:57.90%] [N/A:N/A] % Reduction in Volume: 58.10% [N/A:N/A] Classification: [1:Full Thickness Without Exposed Support Structures] [N/A:N/A] Exudate Amount: [1:Medium] [N/A:N/A] Exudate Type: [1:Serosanguineous] [N/A:N/A] Exudate Color: [1:red, brown] [N/A:N/A] Wound Margin: [1:Distinct, outline attached N/A] Granulation Amount: [1:Medium (34-66%)] [N/A:N/A] Granulation Quality: [1:Pink] [N/A:N/A] Necrotic Amount: [1:Medium (34-66%)] [N/A:N/A] Exposed Structures: [N/A:N/A] Fat Layer (Subcutaneous Tissue) Exposed: Yes Fascia: No Tendon: No Muscle: No Joint: No Bone: No Epithelialization: None N/A N/A Periwound Skin Texture: Excoriation: Yes N/A N/A Induration: No Callus: No Crepitus: No Rash: No Scarring: No Periwound Skin Maceration: No N/A N/A Moisture: Dry/Scaly: No Periwound Skin Color: Erythema: Yes N/A N/A Hemosiderin Staining: Yes Mottled: Yes Atrophie Blanche: No Cyanosis: No Ecchymosis: No Pallor: No Rubor: No Erythema Location: Circumferential N/A  N/A Temperature: No Abnormality N/A N/A Tenderness on Yes N/A N/A Palpation: Wound Preparation: Ulcer Cleansing: N/A N/A Rinsed/Irrigated with Saline, Other: surg scrub and water Topical Anesthetic Applied: Other: lidocaine 4% Treatment Notes Wound #1 (Right, Medial Ankle) 1. Cleansed with: Cleanse wound with antibacterial soap and water 3. Peri-wound Care: Barrier cream Other peri-wound care (specify in notes) 4. Dressing Applied: Santyl Ointment 5. Secondary Dressing Applied ABD Pad 7. Secured with Karen Bowman ([id number]) 3 Layer Compression System - Right Lower Extremity Notes TCA Electronic Signature(s) Signed: 09/10/2016 6:01:27 PM By: Linton Ham MD Entered By: Linton Ham on 09/10/2016 10:45:19 Karen Bowman ([id number]) -------------------------------------------------------------------------------- [place] Details Patient Name: Karen Bowman Date of Service: 09/10/2016 10:15 AM Medical Record Number: [id number] Patient Account Number: [id number] Date of Birth/Sex: [birth year] (76 y.o. Female) Treating RN: Afful, RN, BSN, Velva Harman Primary Care Yaroslav Gombos: [name] Other Clinician: Referring Sigmund Morera: [name] Treating Arzella Rehmann/Extender: Tito Dine in Treatment: 2 Active Inactive ` Orientation to the Wound Care Program Nursing Diagnoses: Knowledge deficit related to the wound healing center program Goals: Patient/caregiver will verbalize understanding of the [facility] Date Initiated: 08/27/2016 Target Resolution Date: 01/27/2017 Goal Status: Active Interventions: Provide education on orientation to the wound center Notes: ` Venous Leg Ulcer Nursing Diagnoses: Knowledge deficit related to disease process and management Potential for venous Insuffiency (use before diagnosis confirmed) Goals: Non-invasive venous studies are completed as ordered Date Initiated:  08/27/2016 Target Resolution Date: 01/27/2017 Goal Status: Active Patient will maintain optimal edema control Date Initiated: 08/27/2016 Target Resolution Date: 01/27/2017 Goal Status: Active Patient/caregiver will verbalize understanding of disease process and disease management Date Initiated: 08/27/2016 Target Resolution Date: 01/27/2017 Goal Status: Active Verify adequate tissue perfusion prior to therapeutic compression application Date Initiated: 08/27/2016 Target Resolution Date: 01/27/2017 Goal Status: Active Karen Bowman, Karen Bowman ([id number]) Interventions: Assess peripheral edema status every visit. Compression as ordered Provide education on venous insufficiency Treatment Activities: Non-invasive vascular studies : 08/27/2016 Test ordered outside of clinic : 08/27/2016 Therapeutic compression applied : 08/27/2016 Venous Duplex Doppler : 08/27/2016 Notes: ` Wound/Skin Impairment Nursing Diagnoses: Impaired tissue integrity Knowledge deficit related to ulceration/compromised skin integrity Goals: Patient/caregiver will verbalize understanding of skin care regimen Date Initiated: 08/27/2016 Target Resolution Date: 01/27/2017 Goal Status: Active Ulcer/skin breakdown will have a volume reduction of 30% by week 4 Date Initiated: 08/27/2016 Target Resolution Date: 01/27/2017 Goal Status: Active Ulcer/skin breakdown will have a volume reduction of 50% by week 8 Date Initiated: 08/27/2016 Target Resolution Date: 01/27/2017 Goal Status: Active Ulcer/skin breakdown will have a volume reduction of 80% by week 12 Date Initiated: 08/27/2016 Target Resolution Date: 01/27/2017 Goal Status: Active Ulcer/skin breakdown will heal within 14 weeks Date Initiated: 08/27/2016 Target  Resolution Date: 01/27/2017 Goal Status: Active Interventions: Assess patient/caregiver ability to obtain necessary supplies Assess patient/caregiver ability to perform ulcer/skin care regimen upon admission and  as needed Assess ulceration(s) every visit Provide education on ulcer and skin care Treatment Activities: Skin care regimen initiated : 08/27/2016 Karen Bowman ([id number]) Topical wound management initiated : 08/27/2016 Notes: Electronic Signature(s) Signed: 09/10/2016 4:53:20 PM By: Regan Lemming BSN, RN Entered By: Regan Lemming on 09/10/2016 10:37:45 Karen Bowman ([id number]) -------------------------------------------------------------------------------- Pain Assessment Details Patient Name: Karen Bowman Date of Service: 09/10/2016 10:15 AM Medical Record Number: [id number] Patient Account Number: [id number] Date of Birth/Sex: 12-Jul-1940 (76 y.o. Female) Treating RN: Afful, RN, BSN, Velva Harman Primary Care Iriel Nason: [name] Other Clinician: Referring Wilford Merryfield: [name] Treating Nandita Mathenia/Extender: Ricard Dillon Weeks in Treatment: 2 Active Problems Location of Pain Severity and Description of Pain Patient Has Paino No Site Locations With Dressing Change: No Pain Management and Medication Current Pain Management: Electronic Signature(s) Signed: 09/10/2016 4:53:20 PM By: Regan Lemming BSN, RN Entered By: Regan Lemming on 09/10/2016 10:19:35 Karen Bowman ([id number]) -------------------------------------------------------------------------------- Patient/Caregiver Education Details Patient Name: Karen Bowman Date of Service: 09/10/2016 10:15 AM Medical Record Patient Account Number: [id number] Number: Treating RN: Baruch Gouty, RN, BSN, Rita Jan 29, 1941 [phone] y.o. Other Clinician: Date of Birth/Gender: Female) Treating ROBSON, MICHAEL Primary Care Physician: [name] Physician/Extender: G Referring Physician: Leslye Peer in Treatment: 2 Education Assessment Education Provided To: Patient Education Topics Provided Venous: Methods: Explain/Verbal Responses: State content correctly Welcome To [facility]: Methods:  Explain/Verbal Responses: State content correctly Wound/Skin Impairment: Methods: Explain/Verbal Responses: State content correctly Electronic Signature(s) Signed: 09/10/2016 4:53:20 PM By: Regan Lemming BSN, RN Entered By: Regan Lemming on 09/10/2016 10:39:52 Karen Bowman ([id number]) -------------------------------------------------------------------------------- Wound Assessment Details Patient Name: Karen Bowman Date of Service: 09/10/2016 10:15 AM Medical Record Number: [id number] Patient Account Number: [id number] Date of Birth/Sex: 11-16-1940 (76 y.o. Female) Treating RN: Afful, RN, BSN, [place] Primary Care Itzae Mccurdy: [name] Other Clinician: Referring Zilah Villaflor: [name] Treating Nygeria Lager/Extender: Ricard Dillon Weeks in Treatment: 2 Wound Status Wound Number: 1 Primary Vasculopathy Etiology: Wound Location: Right Ankle - Medial Wound Open Wounding Event: Gradually Appeared Status: Date Acquired: 09/30/2015 Comorbid Arrhythmia, Coronary Artery Disease, Weeks Of Treatment: 2 History: Peripheral Arterial Disease, Peripheral Clustered Wound: No Venous Disease, Osteoarthritis, Neuropathy Photos Photo Uploaded By: Regan Lemming on 09/10/2016 16:59:28 Wound Measurements Length: (cm) 1.1 Width: (cm) 1.3 Depth: (cm) 0.1 Area: (cm) 1.123 Volume: (cm) 0.112 % Reduction in Area: 57.9% % Reduction in Volume: 58.1% Epithelialization: None Tunneling: No Undermining: No Wound Description Full Thickness Without Exposed Classification: Support Structures Wound Margin: Distinct, outline attached Exudate Medium Amount: Exudate Type: Serosanguineous Exudate Color: red, brown Foul Odor After Cleansing: No Slough/Fibrino Yes Wound Bed Granulation Amount: Medium (34-66%) Exposed Structure Karen Bowman, Karen Bowman ([id number]) Granulation Quality: Pink Fascia Exposed: No Necrotic Amount: Medium (34-66%) Fat Layer (Subcutaneous Tissue) Exposed: Yes Necrotic  Quality: Adherent Slough Tendon Exposed: No Muscle Exposed: No Joint Exposed: No Bone Exposed: No Periwound Skin Texture Texture Color No Abnormalities Noted: No No Abnormalities Noted: No Callus: No Atrophie Blanche: No Crepitus: No Cyanosis: No Excoriation: Yes Ecchymosis: No Induration: No Erythema: Yes Rash: No Erythema Location: Circumferential Scarring: No Hemosiderin Staining: Yes Mottled: Yes Moisture Pallor: No No Abnormalities Noted: No Rubor: No Dry / Scaly: No Maceration: No Temperature / Pain Temperature: No Abnormality Tenderness on Palpation: Yes Wound Preparation Ulcer Cleansing: Rinsed/Irrigated with Saline, Other: surg scrub and water, Topical Anesthetic  Applied: Other: lidocaine 4%, Treatment Notes Wound #1 (Right, Medial Ankle) 1. Cleansed with: Cleanse wound with antibacterial soap and water 3. Peri-wound Care: Barrier cream Other peri-wound care (specify in notes) 4. Dressing Applied: Santyl Ointment 5. Secondary Dressing Applied ABD Pad 7. Secured with 3 Layer Compression System - Right Lower Extremity Notes TCA Electronic Signature(s) Signed: 09/10/2016 4:53:20 PM By: Regan Lemming BSN, RN Entered By: Regan Lemming on 09/10/2016 10:29:26 Karen Bowman ([name] ([id number]) -------------------------------------------------------------------------------- Vitals Details Patient Name: Karen Bowman Date of Service: 09/10/2016 10:15 AM Medical Record Number: [id number] Patient Account Number: [id number] Date of Birth/Sex: June 27, 1940 (76 y.o. Female) Treating RN: Afful, RN, BSN, [place] Primary Care Brentlee Sciara: [name] Other Clinician: Referring Sameena Artus: [name] Treating Delta Pichon/Extender: Tito Dine in Treatment: 2 Vital Signs Time Taken: 10:22 Temperature (F): 98.2 Height (in): 65 Pulse (bpm): 57 Weight (lbs): 132 Respiratory Rate (breaths/min): 16 Body Mass Index (BMI):  22 Blood Pressure (mmHg): 110/85 Reference Range: 80 - 120 mg / dl Electronic Signature(s) Signed: 09/10/2016 4:53:20 PM By: Regan Lemming BSN, RN Entered By: Regan Lemming on 09/10/2016 10:22:36

## 2016-09-11 NOTE — Progress Notes (Signed)
MYLEI, BRACKEEN ([id number]) Visit Report for 09/10/2016 Chief Complaint Document Details Patient Name: Karen Bowman, Karen Bowman Date of Service: 09/10/2016 10:15 AM Medical Record Patient Account Number: [id number] Number: Treating RN: Baruch Gouty, RN, BSN, Rita Oct 08, 1940 [phone] y.o. Other Clinician: Date of Birth/Sex: Female) Treating ROBSON, MICHAEL Primary Care Provider: [name] Provider/Extender: G Referring Provider: Leslye Peer in Treatment: 2 Information Obtained from: Patient Chief Complaint 08/27/16; patient is here for review of a chronic wound on the right medial ankle that has been present for one year Electronic Signature(s) Signed: 09/10/2016 6:01:27 PM By: Linton Ham MD Entered By: Linton Ham on 09/10/2016 10:48:20 Karen Bowman ([id number]) -------------------------------------------------------------------------------- HPI Details Patient Name: Karen Bowman Date of Service: 09/10/2016 10:15 AM Medical Record Patient Account Number: [id number] Number: Treating RN: Baruch Gouty, RN, BSN, Rita [birth year] [phone] y.o. Other Clinician: Date of Birth/Sex: Female) Treating ROBSON, MICHAEL Primary Care Provider: [name] Provider/Extender: G Referring Provider: Leslye Peer in Treatment: 2 History of Present Illness HPI Description: 08/27/16; his is a 76 year old woman who has been dealing with a ulcer on her right medial ankle for about a year. She is uncertain about how this actually opened however it is not really changed that much in quite some time. She has a history of bleeding per Deniece Ree these on the left more remotely but no other ulcer history. She is not a diabetic but she does have chronic right leg greater than left leg swelling. Looking through [employer] she has a history of eczema, systolic congestive heart failure left CVA and paroxysmal A. fib on Eliquis. She has been using Neosporin and more  recently Bactroban without improvement. She saw her primary physician who referred her here, notes they're suggesting Coban and gauze. Dimensions of the wounds 1.7 x 2 x 0.1. ABIs in this clinic were 1.0 bilaterally. 09/03/16; wound dimensions are smaller. Most of the wound still covered in an adherent necrotic surface. We have been using Santyl under compression. Patient complains of pain and discomfort 09/10/16; wound dimensions on the right medial malleolus are improved. Condition of the wound bed is improved. We have been using Santyl under compression. She has chronic venous inflammation around the wound which is tender. Electronic Signature(s) Signed: 09/10/2016 6:01:27 PM By: Linton Ham MD Entered By: Linton Ham on 09/10/2016 10:49:12 Karen Bowman ([id number]) -------------------------------------------------------------------------------- Physical Exam Details Patient Name: Karen Bowman Date of Service: 09/10/2016 10:15 AM Medical Record Patient Account Number: [id number] Number: Treating RN: Baruch Gouty, RN, BSN, Rita [birth year] [phone] y.o. Other Clinician: Date of Birth/Sex: Female) Treating ROBSON, MICHAEL Primary Care Provider: [name] Provider/Extender: G Referring Provider: [name] Weeks in Treatment: 2 Constitutional Sitting or standing Blood Pressure is within target range for patient.. Pulse regular and within target range for patient.Marland Kitchen Respirations regular, non-labored and within target range.. Temperature is normal and within the target range for the patient.Marland Kitchen appears in no distress. Eyes Conjunctivae clear. No discharge. Respiratory Respiratory effort is easy and symmetric bilaterally. Rate is normal at rest and on room air.. Cardiovascular Pedal pulses palpable and strong bilaterally.. Varicosities and chronic venous insufficiency. She has venous inflammation around the wound. There is no warmth but tender. Gastrointestinal  (GI) . Lymphatic None palpable in the popliteal or inguinal area. Psychiatric No evidence of depression, anxiety, or agitation. Calm, cooperative, and communicative. Appropriate interactions and affect.. Notes Wound exam; the patient has a irregular but superficial wound on the right medial ankle. Noted Ozella Rocks is required this looks better. Surrounding  the wound is a fair amount of inflammation I don't think this represents cellulitis. Electronic Signature(s) Signed: 09/10/2016 6:01:27 PM By: Linton Ham MD Entered By: Linton Ham on 09/10/2016 10:50:58 Karen Bowman ([id number]) -------------------------------------------------------------------------------- Physician Orders Details Patient Name: Karen Bowman Date of Service: 09/10/2016 10:15 AM Medical Record Patient Account Number: [id number] Number: Treating RN: Baruch Gouty, RN, BSN, Rita [birth year] [phone] y.o. Other Clinician: Date of Birth/Sex: Female) Treating ROBSON, MICHAEL Primary Care Provider: [name] Provider/Extender: G Referring Provider: Leslye Peer in Treatment: 2 Verbal / Phone Orders: No Diagnosis Coding Wound Cleansing Wound #1 Right,Medial Ankle o Clean wound with Normal Saline. o May shower with protection. - USE CAST PROTECTOR o No tub bath. Anesthetic Wound #1 Right,Medial Ankle o Topical Lidocaine 4% cream applied to wound bed prior to debridement Skin Barriers/Peri-Wound Care Wound #1 Right,Medial Ankle o Triamcinolone Acetonide Ointment Primary Wound Dressing Wound #1 Right,Medial Ankle o Santyl Ointment Secondary Dressing Wound #1 Right,Medial Ankle o ABD pad o Dry Gauze Dressing Change Frequency Wound #1 Right,Medial Ankle o Change dressing every week Follow-up Appointments Wound #1 Right,Medial Ankle o Return Appointment in 1 week. Edema Control Wound #1 Right,Medial Ankle ARCHITA, LOMELI ([id number]) o 3 Layer Compression  System - Right Lower Extremity o Elevate legs to the level of the heart and pump ankles as often as possible Additional Orders / Instructions Wound #1 Right,Medial Ankle o Increase protein intake. o Activity as tolerated Medications-please add to medication list. Wound #1 Right,Medial Ankle o Santyl Enzymatic Ointment Electronic Signature(s) Signed: 09/10/2016 4:53:20 PM By: Regan Lemming BSN, RN Signed: 09/10/2016 6:01:27 PM By: Linton Ham MD Entered By: Regan Lemming on 09/10/2016 10:38:52 Karen Bowman ([id number]) -------------------------------------------------------------------------------- Problem List Details Patient Name: Karen Bowman Date of Service: 09/10/2016 10:15 AM Medical Record Patient Account Number: [id number] Number: Treating RN: Baruch Gouty, RN, BSN, Rita [birth year] [phone] y.o. Other Clinician: Date of Birth/Sex: Female) Treating ROBSON, MICHAEL Primary Care Provider: [name] Provider/Extender: G Referring Provider: Leslye Peer in Treatment: 2 Active Problems ICD-10 Encounter Code Description Active Date Diagnosis L97.311 Non-pressure chronic ulcer of right ankle limited to 08/27/2016 Yes breakdown of skin I87.331 Chronic venous hypertension (idiopathic) with ulcer and 08/27/2016 Yes inflammation of right lower extremity Inactive Problems Resolved Problems Electronic Signature(s) Signed: 09/10/2016 6:01:27 PM By: Linton Ham MD Entered By: Linton Ham on 09/10/2016 10:45:03 Karen Bowman ([id number]) -------------------------------------------------------------------------------- Progress Note Details Patient Name: Karen Bowman Date of Service: 09/10/2016 10:15 AM Medical Record Patient Account Number: [id number] Number: Treating RN: Baruch Gouty, RN, BSN, Rita 1940-09-12 [phone] y.o. Other Clinician: Date of Birth/Sex: Female) Treating ROBSON, MICHAEL Primary Care Provider: [name] Provider/Extender: G Referring Provider: Leslye Peer in Treatment: 2 Subjective Chief Complaint Information obtained from Patient 08/27/16; patient is here for review of a chronic wound on the right medial ankle that has been present for one year History of Present Illness (HPI) 08/27/16; his is a 76 year old woman who has been dealing with a ulcer on her right medial ankle for about a year. She is uncertain about how this actually opened however it is not really changed that much in quite some time. She has a history of bleeding per Deniece Ree these on the left more remotely but no other ulcer history. She is not a diabetic but she does have chronic right leg greater than left leg swelling. Looking through [employer] she has a history of eczema, systolic congestive heart failure left CVA and paroxysmal A. fib  on Eliquis. She has been using Neosporin and more recently Bactroban without improvement. She saw her primary physician who referred her here, notes they're suggesting Coban and gauze. Dimensions of the wounds 1.7 x 2 x 0.1. ABIs in this clinic were 1.0 bilaterally. 09/03/16; wound dimensions are smaller. Most of the wound still covered in an adherent necrotic surface. We have been using Santyl under compression. Patient complains of pain and discomfort 09/10/16; wound dimensions on the right medial malleolus are improved. Condition of the wound bed is improved. We have been using Santyl under compression. She has chronic venous inflammation around the wound which is tender. Objective Constitutional Sitting or standing Blood Pressure is within target range for patient.. Pulse regular and within target range for patient.Marland Kitchen Respirations regular, non-labored and within target range.. Temperature is normal and within the target range for the patient.Marland Kitchen appears in no distress. Vitals Time Taken: 10:22 AM, Height: 65 in, Weight: 132 lbs, BMI: 22, Temperature: 98.2 F, Pulse:  57 Devan, Thomasina G. ([id number]) bpm, Respiratory Rate: 16 breaths/min, Blood Pressure: 110/85 mmHg. Eyes Conjunctivae clear. No discharge. Respiratory Respiratory effort is easy and symmetric bilaterally. Rate is normal at rest and on room air.. Cardiovascular Pedal pulses palpable and strong bilaterally.. Varicosities and chronic venous insufficiency. She has venous inflammation around the wound. There is no warmth but tender. Lymphatic None palpable in the popliteal or inguinal area. Psychiatric No evidence of depression, anxiety, or agitation. Calm, cooperative, and communicative. Appropriate interactions and affect.. General Notes: Wound exam; the patient has a irregular but superficial wound on the right medial ankle. Noted Ozella Rocks is required this looks better. Surrounding the wound is a fair amount of inflammation I don't think this represents cellulitis. Integumentary (Hair, Skin) Wound #1 status is Open. Original cause of wound was Gradually Appeared. The wound is located on the Right,Medial Ankle. The wound measures 1.1cm length x 1.3cm width x 0.1cm depth; 1.123cm^2 area and 0.112cm^3 volume. There is Fat Layer (Subcutaneous Tissue) Exposed exposed. There is no tunneling or undermining noted. There is a medium amount of serosanguineous drainage noted. The wound margin is distinct with the outline attached to the wound base. There is medium (34-66%) pink granulation within the wound bed. There is a medium (34-66%) amount of necrotic tissue within the wound bed including Adherent Slough. The periwound skin appearance exhibited: Excoriation, Hemosiderin Staining, Mottled, Erythema. The periwound skin appearance did not exhibit: Callus, Crepitus, Induration, Rash, Scarring, Dry/Scaly, Maceration, Atrophie Blanche, Cyanosis, Ecchymosis, Pallor, Rubor. The surrounding wound skin color is noted with erythema which is circumferential. Periwound temperature was noted as  No Abnormality. The periwound has tenderness on palpation. Assessment Active Problems ICD-10 D14.970 - Non-pressure chronic ulcer of right ankle limited to breakdown of skin I87.331 - Chronic venous hypertension (idiopathic) with ulcer and inflammation of right lower extremity MACKENIZE, DELGADILLO ([id number]) Plan Wound Cleansing: Wound #1 Right,Medial Ankle: Clean wound with Normal Saline. May shower with protection. - USE CAST PROTECTOR No tub bath. Anesthetic: Wound #1 Right,Medial Ankle: Topical Lidocaine 4% cream applied to wound bed prior to debridement Skin Barriers/Peri-Wound Care: Wound #1 Right,Medial Ankle: Triamcinolone Acetonide Ointment Primary Wound Dressing: Wound #1 Right,Medial Ankle: Santyl Ointment Secondary Dressing: Wound #1 Right,Medial Ankle: ABD pad Dry Gauze Dressing Change Frequency: Wound #1 Right,Medial Ankle: Change dressing every week Follow-up Appointments: Wound #1 Right,Medial Ankle: Return Appointment in 1 week. Edema Control: Wound #1 Right,Medial Ankle: 3 Layer Compression System - Right Lower Extremity Elevate legs to the level of the heart and  pump ankles as often as possible Additional Orders / Instructions: Wound #1 Right,Medial Ankle: Increase protein intake. Activity as tolerated Medications-please add to medication list.: Wound #1 Right,Medial Ankle: Santyl Enzymatic Ointment Wound is improved. No debridement today Continue Santyl based dressings MELISSSA, DONNER ([id number]) Awaiting venous reflux studies Electronic Signature(s) Signed: 09/10/2016 6:01:27 PM By: Linton Ham MD Entered By: Linton Ham on 09/10/2016 10:51:45 Karen Bowman ([id number]) -------------------------------------------------------------------------------- SuperBill Details Patient Name: Karen Bowman Date of Service: 09/10/2016 Medical Record Patient Account Number: [id number] Number: Treating RN: Baruch Gouty, RN, BSN,  Rita Aug 01, 1940 [phone] y.o. Other Clinician: Date of Birth/Sex: Female) Treating ROBSON, MICHAEL Primary Care Provider: [name] Provider/Extender: G Referring Provider: [name] Weeks in Treatment: 2 Diagnosis Coding ICD-10 Codes Code Description B90.383 Non-pressure chronic ulcer of right ankle limited to breakdown of skin Chronic venous hypertension (idiopathic) with ulcer and inflammation of right lower I87.331 extremity Facility Procedures CPT4: Description Modifier Quantity Code [id number] (Facility Use Only) [phone] - APPLY [place] RT 1 LEG Physician Procedures CPT4: Description Modifier Quantity Code [id number] - WC PHYS LEVEL 3 - EST PT 1 ICD-10 Description Diagnosis R32.023 Non-pressure chronic ulcer of right ankle limited to breakdown of skin I87.331 Chronic venous hypertension (idiopathic) with ulcer  and inflammation of right lower extremity Electronic Signature(s) Signed: 09/10/2016 6:01:27 PM By: Linton Ham MD Entered By: Linton Ham on 09/10/2016 10:52:05

## 2016-09-17 ENCOUNTER — Encounter: Payer: Medicare PPO | Admitting: Internal Medicine

## 2016-09-17 DIAGNOSIS — I87331 Chronic venous hypertension (idiopathic) with ulcer and inflammation of right lower extremity: Secondary | ICD-10-CM | POA: Diagnosis not present

## 2016-09-18 ENCOUNTER — Telehealth: Payer: Self-pay | Admitting: Family Medicine

## 2016-09-18 NOTE — Telephone Encounter (Signed)
Update was dropped off from wound care center. Placed on cart.

## 2016-09-19 NOTE — Progress Notes (Signed)
LASHARON, DUNIVAN ([id number]) Visit Report for 09/17/2016 Chief Complaint Document Details Patient Name: Karen Bowman, Karen Bowman Date of Service: 09/17/2016 3:45 PM Medical Record Patient Account Number: [id number] Number: Treating RN: Cornell Barman 07-28-1940 (76 y.o. Other Clinician: Date of Birth/Sex: Female) Treating Haya Hemler Primary Care Provider: [name] Provider/Extender: G Referring Provider: Leslye Peer in Treatment: 3 Information Obtained from: Patient Chief Complaint 08/27/16; patient is here for review of a chronic wound on the right medial ankle that has been present for one year Electronic Signature(s) Signed: 09/17/2016 4:47:51 PM By: Linton Ham MD Entered By: Linton Ham on 09/17/2016 16:23:53 [name] ([id number]) -------------------------------------------------------------------------------- HPI Details Patient Name: [name] Date of Service: 09/17/2016 3:45 PM Medical Record Patient Account Number: [id number] Number: Treating RN: Cornell Barman December 31, 1940 (76 y.o. Other Clinician: Date of Birth/Sex: Female) Treating Jessabelle Markiewicz Primary Care Provider: [name] Provider/Extender: G Referring Provider: Leslye Peer in Treatment: 3 History of Present Illness HPI Description: 08/27/16; his is a 76 year old woman who has been dealing with a ulcer on her right medial ankle for about a year. She is uncertain about how this actually opened however it is not really changed that much in quite some time. She has a history of bleeding per Deniece Ree these on the left more remotely but no other ulcer history. She is not a diabetic but she does have chronic right leg greater than left leg swelling. Looking through [employer] she has a history of eczema, systolic congestive heart failure left CVA and paroxysmal A. fib on Eliquis. She has been using Neosporin and more recently Bactroban without  improvement. She saw her primary physician who referred her here, notes they're suggesting Coban and gauze. Dimensions of the wounds 1.7 x 2 x 0.1. ABIs in this clinic were 1.0 bilaterally. 09/03/16; wound dimensions are smaller. Most of the wound still covered in an adherent necrotic surface. We have been using Santyl under compression. Patient complains of pain and discomfort 09/10/16; wound dimensions on the right medial malleolus are improved. Condition of the wound bed is improved. We have been using Santyl under compression. She has chronic venous inflammation around the wound which is tender. 09/17/16; 1 x 1.2 x 0.1. Surface of this looks better. No debridement. No evidence of surrounding infection. For some reason her reflux studies are booked for late July although she appears to be on some sort of cancellation list Electronic Signature(s) Signed: 09/17/2016 4:47:51 PM By: Linton Ham MD Entered By: Linton Ham on 09/17/2016 16:25:07 [name] ([id number]) -------------------------------------------------------------------------------- Physical Exam Details Patient Name: [name] Date of Service: 09/17/2016 3:45 PM Medical Record Patient Account Number: [id number] Number: Treating RN: Cornell Barman 10-May-1940 (76 y.o. Other Clinician: Date of Birth/Sex: Female) Treating Tynell Winchell Primary Care Provider: [name] Provider/Extender: G Referring Provider: [name] Weeks in Treatment: 3 Constitutional Sitting or standing Blood Pressure is within target range for patient.. Pulse regular and within target range for patient.Marland Kitchen Respirations regular, non-labored and within target range.. Temperature is normal and within the target range for the patient.Marland Kitchen appears in no distress. Notes Wound exam; the wound definitely smaller than when we first saw her 3 weeks ago. There is also a better- looking surface. I vigorously scrubbed this with gauze.  No debridement was done. There is no evidence of surrounding infection. As this appeared to do better with Santyl initially I left this however it is not any smaller this week therefore I'll change to silver  collagen. Electronic Signature(s) Signed: 09/17/2016 4:47:51 PM By: Linton Ham MD Entered By: Linton Ham on 09/17/2016 16:26:54 [name] ([id number]) -------------------------------------------------------------------------------- Physician Orders Details Patient Name: [name] Date of Service: 09/17/2016 3:45 PM Medical Record Patient Account Number: [id number] Number: Treating RN: Cornell Barman 1941-02-15 (76 y.o. Other Clinician: Date of Birth/Sex: Female) Treating Ledarrius Beauchaine Primary Care Provider: [name] Provider/Extender: G Referring Provider: Leslye Peer in Treatment: 3 Verbal / Phone Orders: No Diagnosis Coding Wound Cleansing Wound #1 Right,Medial Ankle o Clean wound with Normal Saline. o May shower with protection. - USE CAST PROTECTOR o No tub bath. Anesthetic Wound #1 Right,Medial Ankle o Topical Lidocaine 4% cream applied to wound bed prior to debridement Skin Barriers/Peri-Wound Care Wound #1 Right,Medial Ankle o Triamcinolone Acetonide Ointment Primary Wound Dressing Wound #1 Right,Medial Ankle o Prisma Ag Secondary Dressing Wound #1 Right,Medial Ankle o ABD pad Dressing Change Frequency Wound #1 Right,Medial Ankle o Change dressing every week Follow-up Appointments Wound #1 Right,Medial Ankle o Return Appointment in 1 week. Edema Control Wound #1 Right,Medial Ankle o 3 Layer Compression System - Right Lower Extremity LATREECE, MOCHIZUKI ([id number]) o Elevate legs to the level of the heart and pump ankles as often as possible Additional Orders / Instructions Wound #1 Right,Medial Ankle o Increase protein intake. o Activity as tolerated Electronic Signature(s) Signed:  09/18/2016 8:26:41 AM By: Gretta Cool, BSN, RN, CWS, Kim RN, BSN Signed: 09/18/2016 8:29:14 AM By: Linton Ham MD Previous Signature: 09/17/2016 4:47:51 PM Version By: Linton Ham MD Entered By: Gretta Cool, BSN, RN, CWS, Kim on 09/17/2016 17:09:15 [name] ([id number]) -------------------------------------------------------------------------------- Problem List Details Patient Name: SHANDIIN, EISENBEIS Date of Service: 09/17/2016 3:45 PM Medical Record Patient Account Number: [id number] Number: Treating RN: Cornell Barman 07/24/1940 (76 y.o. Other Clinician: Date of Birth/Sex: Female) Treating Thessaly Mccullers Primary Care Provider: [name] Provider/Extender: G Referring Provider: Leslye Peer in Treatment: 3 Active Problems ICD-10 Encounter Code Description Active Date Diagnosis L97.311 Non-pressure chronic ulcer of right ankle limited to 08/27/2016 Yes breakdown of skin I87.331 Chronic venous hypertension (idiopathic) with ulcer and 08/27/2016 Yes inflammation of right lower extremity Inactive Problems Resolved Problems Electronic Signature(s) Signed: 09/17/2016 4:47:51 PM By: Linton Ham MD Entered By: Linton Ham on 09/17/2016 16:23:33 [name] ([id number]) -------------------------------------------------------------------------------- Progress Note Details Patient Name: [name] Date of Service: 09/17/2016 3:45 PM Medical Record Patient Account Number: [id number] Number: Treating RN: Cornell Barman December 18, 1940 (76 y.o. Other Clinician: Date of Birth/Sex: Female) Treating Ileta Ofarrell Primary Care Provider: [name] Provider/Extender: G Referring Provider: Leslye Peer in Treatment: 3 Subjective Chief Complaint Information obtained from Patient 08/27/16; patient is here for review of a chronic wound on the right medial ankle that has been present for one year History of Present Illness (HPI) 08/27/16;  his is a 76 year old woman who has been dealing with a ulcer on her right medial ankle for about a year. She is uncertain about how this actually opened however it is not really changed that much in quite some time. She has a history of bleeding per Deniece Ree these on the left more remotely but no other ulcer history. She is not a diabetic but she does have chronic right leg greater than left leg swelling. Looking through [employer] she has a history of eczema, systolic congestive heart failure left CVA and paroxysmal A. fib on Eliquis. She has been using Neosporin and more recently Bactroban without improvement. She saw her primary physician who  referred her here, notes they're suggesting Coban and gauze. Dimensions of the wounds 1.7 x 2 x 0.1. ABIs in this clinic were 1.0 bilaterally. 09/03/16; wound dimensions are smaller. Most of the wound still covered in an adherent necrotic surface. We have been using Santyl under compression. Patient complains of pain and discomfort 09/10/16; wound dimensions on the right medial malleolus are improved. Condition of the wound bed is improved. We have been using Santyl under compression. She has chronic venous inflammation around the wound which is tender. 09/17/16; 1 x 1.2 x 0.1. Surface of this looks better. No debridement. No evidence of surrounding infection. For some reason her reflux studies are booked for late July although she appears to be on some sort of cancellation list Objective Constitutional Sitting or standing Blood Pressure is within target range for patient.. Pulse regular and within target range for patient.Marland Kitchen Respirations regular, non-labored and within target range.. Temperature is normal and within TESA, MEADORS. ([id number]) the target range for the patient.Marland Kitchen appears in no distress. Vitals Time Taken: 4:04 PM, Height: 65 in, Weight: 132 lbs, BMI: 22, Temperature: 98.1 F, Pulse: 60 bpm, Respiratory Rate: 16 breaths/min, Blood  Pressure: 140/60 mmHg. General Notes: Wound exam; the wound definitely smaller than when we first saw her 3 weeks ago. There is also a better-looking surface. I vigorously scrubbed this with gauze. No debridement was done. There is no evidence of surrounding infection. As this appeared to do better with Santyl initially I left this however it is not any smaller this week therefore I'll change to silver collagen. Integumentary (Hair, Skin) Wound #1 status is Open. Original cause of wound was Gradually Appeared. The wound is located on the Right,Medial Ankle. The wound measures 1cm length x 1.2cm width x 0.1cm depth; 0.942cm^2 area and 0.094cm^3 volume. There is Fat Layer (Subcutaneous Tissue) Exposed exposed. There is no tunneling or undermining noted. There is a medium amount of serosanguineous drainage noted. The wound margin is distinct with the outline attached to the wound base. There is medium (34-66%) pink granulation within the wound bed. There is a medium (34-66%) amount of necrotic tissue within the wound bed including Adherent Slough. The periwound skin appearance exhibited: Excoriation, Hemosiderin Staining, Mottled, Erythema. The periwound skin appearance did not exhibit: Callus, Crepitus, Induration, Rash, Scarring, Dry/Scaly, Maceration, Atrophie Blanche, Cyanosis, Ecchymosis, Pallor, Rubor. The surrounding wound skin color is noted with erythema which is circumferential. Periwound temperature was noted as No Abnormality. The periwound has tenderness on palpation. Assessment Active Problems ICD-10 F81.829 - Non-pressure chronic ulcer of right ankle limited to breakdown of skin I87.331 - Chronic venous hypertension (idiopathic) with ulcer and inflammation of right lower extremity Procedures Wound #1 Pre-procedure diagnosis of Wound #1 is a Vasculopathy located on the Right,Medial Ankle . There was a Three Layer Compression Therapy Procedure with a pre-treatment ABI of 1.2 by  Cornell Barman, RN. Post procedure Diagnosis Wound #1: Same as Pre-Procedure MUNIRAH, DOERNER ([id number]) Plan Wound Cleansing: Wound #1 Right,Medial Ankle: Clean wound with Normal Saline. May shower with protection. - USE CAST PROTECTOR No tub bath. Anesthetic: Wound #1 Right,Medial Ankle: Topical Lidocaine 4% cream applied to wound bed prior to debridement Skin Barriers/Peri-Wound Care: Wound #1 Right,Medial Ankle: Triamcinolone Acetonide Ointment Primary Wound Dressing: Wound #1 Right,Medial Ankle: Prisma Ag Secondary Dressing: Wound #1 Right,Medial Ankle: ABD pad Dressing Change Frequency: Wound #1 Right,Medial Ankle: Change dressing every week Follow-up Appointments: Wound #1 Right,Medial Ankle: Return Appointment in 1 week. Edema Control: Wound #1 Right,Medial Ankle:  3 Layer Compression System - Right Lower Extremity Elevate legs to the level of the heart and pump ankles as often as possible Additional Orders / Instructions: Wound #1 Right,Medial Ankle: Increase protein intake. Activity as tolerated #1 silver collagen under compression. #2 the wound is smaller and circumferential. I don't think this needs a biopsy at this point Electronic Signature(s) Signed: 09/18/2016 8:20:16 AM By: Gretta Cool, BSN, RN, CWS, Kim RN, BSN [name] ([id number]) Signed: 09/18/2016 8:29:14 AM By: Linton Ham MD Previous Signature: 09/17/2016 4:47:51 PM Version By: Linton Ham MD Entered By: Gretta Cool, BSN, RN, CWS, Kim on 09/18/2016 08:20:16 [name] ([id number]) -------------------------------------------------------------------------------- SuperBill Details Patient Name: [name] Date of Service: 09/17/2016 Medical Record Patient Account Number: [id number] Number: Treating RN: Cornell Barman 04-May-1940 (76 y.o. Other Clinician: Date of Birth/Sex: Female) Treating Nancyjo Givhan Primary Care Provider: [name] Provider/Extender:  G Referring Provider: [name] Weeks in Treatment: 3 Diagnosis Coding ICD-10 Codes Code Description C30.131 Non-pressure chronic ulcer of right ankle limited to breakdown of skin Chronic venous hypertension (idiopathic) with ulcer and inflammation of right lower I87.331 extremity Facility Procedures CPT4: Description Modifier Quantity Code [id number] (Facility Use Only) [phone] - APPLY [place] [address] 1 LEG Physician Procedures CPT4 Code Description: [id number] - WC PHYS LEVEL 2 - EST PT ICD-10 Description Diagnosis X61.470 Non-pressure chronic ulcer of right ankle limited Modifier: to breakdown of Quantity: 1 skin Electronic Signature(s) Signed: 09/17/2016 4:51:18 PM By: Gretta Cool, BSN, RN, CWS, Kim RN, BSN Signed: 09/18/2016 8:29:14 AM By: Linton Ham MD Previous Signature: 09/17/2016 4:47:51 PM Version By: Linton Ham MD Entered By: Gretta Cool, BSN, RN, CWS, Kim on 09/17/2016 16:51:18

## 2016-09-19 NOTE — Progress Notes (Signed)
Karen Bowman, Karen Bowman ([id number]) Visit Report for 09/17/2016 Arrival Information Details Patient Name: Karen Bowman, Karen Bowman Date of Service: 09/17/2016 3:45 PM Medical Record Number: [id number] Patient Account Number: [id number] Date of Birth/Sex: December 25, 1940 (76 y.o. Female) Treating RN: Cornell Barman Primary Care Charon Akamine: [name] Other Clinician: Referring Sohan Potvin: [name] Treating Dakotah Orrego/Extender: Tito Dine in Treatment: 3 Visit Information History Since Last Visit Added or deleted any medications: No Patient Arrived: Karen Bowman Any new allergies or adverse reactions: No Arrival Time: 16:03 Had a fall or experienced change in No Accompanied By: self activities of daily living that may affect Transfer Assistance: None risk of falls: Patient Identification Verified: Yes Signs or symptoms of abuse/neglect No Secondary Verification Process Completed: Yes since last visito Patient Requires Transmission-Based No Hospitalized since last visit: No Precautions: Has Dressing in Place as Prescribed: Yes Patient Has Alerts: No Has Compression in Place as Yes Prescribed: Has Footwear/Offloading in Place as Yes Prescribed: Right: Surgical Shoe with Pressure Relief Insole Pain Present Now: No Electronic Signature(s) Signed: 09/18/2016 8:26:41 AM By: Gretta Cool, BSN, RN, CWS, Kim RN, BSN Entered By: Gretta Cool, BSN, RN, CWS, Kim on 09/17/2016 16:04:08 Karen Bowman ([id number]) -------------------------------------------------------------------------------- Compression Therapy Details Patient Name: Karen Bowman Date of Service: 09/17/2016 3:45 PM Medical Record Number: [id number] Patient Account Number: [id number] Date of Birth/Sex: 09-19-1940 (76 y.o. Female) Treating RN: Cornell Barman Primary Care Sofi Bryars: [name] Other Clinician: Referring Liz Pinho: [name] Treating Elsie Sakuma/Extender: Ricard Dillon Weeks in Treatment: 3 Compression Therapy Performed  for Wound Wound #1 Right,Medial Ankle Assessment: Performed By: Clinician Cornell Barman, RN Compression Type: Three Layer Pre Treatment ABI: 1.2 Post Procedure Diagnosis Same as Pre-procedure Electronic Signature(s) Signed: 09/17/2016 4:50:57 PM By: Gretta Cool, BSN, RN, CWS, Kim RN, BSN Entered By: Gretta Cool, BSN, RN, CWS, Kim on 09/17/2016 16:50:57 Karen Bowman ([id number]) -------------------------------------------------------------------------------- Encounter Discharge Information Details Patient Name: Karen Bowman Date of Service: 09/17/2016 3:45 PM Medical Record Number: [id number] Patient Account Number: [id number] Date of Birth/Sex: Nov 25, 1940 (76 y.o. Female) Treating RN: Cornell Barman Primary Care Julene Rahn: [name] Other Clinician: Referring Glendon Fiser: [name] Treating Diesel Lina/Extender: Tito Dine in Treatment: 3 Encounter Discharge Information Items Discharge Pain Level: 0 Discharge Condition: Stable Ambulatory Status: Ambulatory Discharge Destination: Home Transportation: Private Auto Accompanied By: self Schedule Follow-up Appointment: Yes Medication Reconciliation completed and provided to Patient/Care Yes Karen Bowman: Provided on Clinical Summary of Care: 09/17/2016 Form Type Recipient Paper Patient LE Electronic Signature(s) Signed: 09/17/2016 4:52:32 PM By: Gretta Cool, BSN, RN, CWS, Kim RN, BSN Previous Signature: 09/17/2016 4:36:35 PM Version By: Ruthine Dose Entered By: Gretta Cool BSN, RN, CWS, Kim on 09/17/2016 16:52:32 Karen Bowman ([id number]) -------------------------------------------------------------------------------- Lower Extremity Assessment Details Patient Name: Karen Bowman Date of Service: 09/17/2016 3:45 PM Medical Record Number: [id number] Patient Account Number: [id number] Date of Birth/Sex: 1940/12/13 (76 y.o. Female) Treating RN: Cornell Barman Primary Care Khalise Billard: [name] Other Clinician: Referring Quincy Prisco:  [name] Treating Maie Kesinger/Extender: Ricard Dillon Weeks in Treatment: 3 Edema Assessment Assessed: [Left: No] [Right: No] E[Left: dema] [Right: :] Calf Left: Right: Point of Measurement: 35 cm From Medial Instep cm 32 cm Ankle Left: Right: Point of Measurement: 9 cm From Medial Instep cm 22.5 cm Vascular Assessment Pulses: Dorsalis Pedis Palpable: [Right:Yes] Posterior Tibial Extremity colors, hair growth, and conditions: Extremity Color: [Right:Normal] Hair Growth on Extremity: [Right:Yes] Temperature of Extremity: [Right:Warm] Capillary Refill: [Right:< 3 seconds] Toe Nail Assessment Left: Right: Thick: No Discolored: No Deformed: No Improper Length and Hygiene: No Electronic  Signature(s) Signed: 09/18/2016 8:26:41 AM By: Gretta Cool, BSN, RN, CWS, Kim RN, BSN Entered By: Gretta Cool, BSN, RN, CWS, Kim on 09/17/2016 16:14:08 Karen Bowman ([id number]) -------------------------------------------------------------------------------- Multi Wound Chart Details Patient Name: Karen Bowman Date of Service: 09/17/2016 3:45 PM Medical Record Number: [id number] Patient Account Number: [id number] Date of Birth/Sex: 11-12-1940 (76 y.o. Female) Treating RN: Cornell Barman Primary Care Saavi Mceachron: [name] Other Clinician: Referring Yajahira Tison: [name] Treating Annlee Glandon/Extender: Ricard Dillon Weeks in Treatment: 3 Vital Signs Height(in): 65 Pulse(bpm): 60 Weight(lbs): 132 Blood Pressure 140/60 (mmHg): Body Mass Index(BMI): 22 Temperature(F): 98.1 Respiratory Rate 16 (breaths/min): Photos: [N/A:N/A] Wound Location: Right Ankle - Medial N/A N/A Wounding Event: Gradually Appeared N/A N/A Primary Etiology: Vasculopathy N/A N/A Comorbid History: Arrhythmia, Coronary N/A N/A Artery Disease, Peripheral Arterial Disease, Peripheral Venous Disease, Osteoarthritis, Neuropathy Date Acquired: 09/30/2015 N/A N/A Weeks of Treatment: 3 N/A N/A Wound Status: Open N/A  N/A Measurements L x W x D 1x1.2x0.1 N/A N/A (cm) Area (cm) : 0.942 N/A N/A Volume (cm) : 0.094 N/A N/A % Reduction in Area: 64.70% N/A N/A % Reduction in Volume: 64.80% N/A N/A Classification: Full Thickness Without N/A N/A Exposed Support Structures Exudate Amount: Medium N/A N/A Exudate Type: Serosanguineous N/A N/A Exudate Color: red, brown N/A N/A Wound Margin: Distinct, outline attached N/A N/A Karen Bowman, Karen Bowman ([id number]) Granulation Amount: Medium (34-66%) N/A N/A Granulation Quality: Pink N/A N/A Necrotic Amount: Medium (34-66%) N/A N/A Exposed Structures: Fat Layer (Subcutaneous N/A N/A Tissue) Exposed: Yes Fascia: No Tendon: No Muscle: No Joint: No Bone: No Epithelialization: None N/A N/A Periwound Skin Texture: Excoriation: Yes N/A N/A Induration: No Callus: No Crepitus: No Rash: No Scarring: No Periwound Skin Maceration: No N/A N/A Moisture: Dry/Scaly: No Periwound Skin Color: Erythema: Yes N/A N/A Hemosiderin Staining: Yes Mottled: Yes Atrophie Blanche: No Cyanosis: No Ecchymosis: No Pallor: No Rubor: No Erythema Location: Circumferential N/A N/A Temperature: No Abnormality N/A N/A Tenderness on Yes N/A N/A Palpation: Wound Preparation: Ulcer Cleansing: N/A N/A Rinsed/Irrigated with Saline, Other: soap and water Topical Anesthetic Applied: Other: lidocaine 4% Treatment Notes Electronic Signature(s) Signed: 09/17/2016 4:47:51 PM By: Linton Ham MD Entered By: Linton Ham on 09/17/2016 16:23:42 Karen Bowman ([id number]) -------------------------------------------------------------------------------- [place] Details Patient Name: Karen Bowman Date of Service: 09/17/2016 3:45 PM Medical Record Number: [id number] Patient Account Number: [id number] Date of Birth/Sex: 06-22-1940 (76 y.o. Female) Treating RN: Cornell Barman Primary Care Avalynne Diver: [name] Other Clinician: Referring Jamone Garrido:  [name] Treating Caro Brundidge/Extender: Tito Dine in Treatment: 3 Active Inactive ` Orientation to the Wound Care Program Nursing Diagnoses: Knowledge deficit related to the wound healing center program Goals: Patient/caregiver will verbalize understanding of the [facility] Program Date Initiated: 08/27/2016 Target Resolution Date: 01/27/2017 Goal Status: Active Interventions: Provide education on orientation to the wound center Notes: ` Venous Leg Ulcer Nursing Diagnoses: Knowledge deficit related to disease process and management Potential for venous Insuffiency (use before diagnosis confirmed) Goals: Non-invasive venous studies are completed as ordered Date Initiated: 08/27/2016 Target Resolution Date: 01/27/2017 Goal Status: Active Patient will maintain optimal edema control Date Initiated: 08/27/2016 Target Resolution Date: 01/27/2017 Goal Status: Active Patient/caregiver will verbalize understanding of disease process and disease management Date Initiated: 08/27/2016 Target Resolution Date: 01/27/2017 Goal Status: Active Verify adequate tissue perfusion prior to therapeutic compression application Date Initiated: 08/27/2016 Target Resolution Date: 01/27/2017 Goal Status: Active Karen Bowman, Karen Bowman ([id number]) Interventions: Assess peripheral edema status every visit. Compression as ordered Provide education on venous insufficiency Treatment  Activities: Non-invasive vascular studies : 08/27/2016 Test ordered outside of clinic : 08/27/2016 Therapeutic compression applied : 08/27/2016 Venous Duplex Doppler : 08/27/2016 Notes: ` Wound/Skin Impairment Nursing Diagnoses: Impaired tissue integrity Knowledge deficit related to ulceration/compromised skin integrity Goals: Patient/caregiver will verbalize understanding of skin care regimen Date Initiated: 08/27/2016 Target Resolution Date: 01/27/2017 Goal Status: Active Ulcer/skin breakdown will  have a volume reduction of 30% by week 4 Date Initiated: 08/27/2016 Target Resolution Date: 01/27/2017 Goal Status: Active Ulcer/skin breakdown will have a volume reduction of 50% by week 8 Date Initiated: 08/27/2016 Target Resolution Date: 01/27/2017 Goal Status: Active Ulcer/skin breakdown will have a volume reduction of 80% by week 12 Date Initiated: 08/27/2016 Target Resolution Date: 01/27/2017 Goal Status: Active Ulcer/skin breakdown will heal within 14 weeks Date Initiated: 08/27/2016 Target Resolution Date: 01/27/2017 Goal Status: Active Interventions: Assess patient/caregiver ability to obtain necessary supplies Assess patient/caregiver ability to perform ulcer/skin care regimen upon admission and as needed Assess ulceration(s) every visit Provide education on ulcer and skin care Treatment Activities: Skin care regimen initiated : 08/27/2016 Karen Bowman ([id number]) Topical wound management initiated : 08/27/2016 Notes: Electronic Signature(s) Signed: 09/18/2016 8:26:41 AM By: Gretta Cool, BSN, RN, CWS, Kim RN, BSN Entered By: Gretta Cool, BSN, RN, CWS, Kim on 09/17/2016 16:18:01 Karen Bowman ([id number]) -------------------------------------------------------------------------------- Pain Assessment Details Patient Name: Karen Bowman Date of Service: 09/17/2016 3:45 PM Medical Record Number: [id number] Patient Account Number: [id number] Date of Birth/Sex: November 22, 1940 (76 y.o. Female) Treating RN: Cornell Barman Primary Care Dilara Navarrete: [name] Other Clinician: Referring Cashus Halterman: [name] Treating Shakeela Rabadan/Extender: Tito Dine in Treatment: 3 Active Problems Location of Pain Severity and Description of Pain Patient Has Paino No Site Locations With Dressing Change: No Pain Management and Medication Current Pain Management: Goals for Pain Management Topical or injectable lidocaine is offered to patient for acute pain when surgical debridement is  performed. If needed, Patient is instructed to use over the counter pain medication for the following 24-48 hours after debridement. Wound care MDs do not prescribed pain medications. Patient has chronic pain or uncontrolled pain. Patient has been instructed to make an appointment with their Primary Care Physician for pain management. Electronic Signature(s) Signed: 09/18/2016 8:26:41 AM By: Gretta Cool, BSN, RN, CWS, Kim RN, BSN Entered By: Gretta Cool, BSN, RN, CWS, Kim on 09/17/2016 16:04:17 Karen Bowman ([id number]) -------------------------------------------------------------------------------- Patient/Caregiver Education Details Patient Name: Karen Bowman Date of Service: 09/17/2016 3:45 PM Medical Record Patient Account Number: [id number] Number: Treating RN: Cornell Barman 1941/02/17 (76 y.o. Other Clinician: Date of Birth/Gender: Female) Treating ROBSON, MICHAEL Primary Care Physician: [name] Physician/Extender: G Referring Physician: Leslye Peer in Treatment: 3 Education Assessment Education Provided To: Patient Education Topics Provided Venous: Handouts: Controlling Swelling with Compression Stockings Methods: Demonstration Responses: State content correctly Wound/Skin Impairment: Handouts: Caring for Your Ulcer Methods: Demonstration Responses: State content correctly Electronic Signature(s) Signed: 09/18/2016 8:26:41 AM By: Gretta Cool, BSN, RN, CWS, Kim RN, BSN Entered By: Gretta Cool, BSN, RN, CWS, Kim on 09/17/2016 16:53:05 Karen Bowman ([id number]) -------------------------------------------------------------------------------- Wound Assessment Details Patient Name: Karen Bowman Date of Service: 09/17/2016 3:45 PM Medical Record Number: [id number] Patient Account Number: [id number] Date of Birth/Sex: [birth year] (76 y.o. Female) Treating RN: Cornell Barman Primary Care Concha Sudol: [name] Other Clinician: Referring Ellowyn Rieves: [name] Treating Kaedan Richert/Extender: Ricard Dillon Weeks in Treatment: 3 Wound Status Wound Number: 1 Primary Vasculopathy Etiology: Wound Location: Right Ankle - Medial Wound Open Wounding Event: Gradually Appeared Status: Date Acquired: 09/30/2015 Comorbid Arrhythmia, Coronary  Artery Disease, Weeks Of Treatment: 3 History: Peripheral Arterial Disease, Peripheral Clustered Wound: No Venous Disease, Osteoarthritis, Neuropathy Photos Wound Measurements Length: (cm) 1 Width: (cm) 1.2 Depth: (cm) 0.1 Area: (cm) 0.942 Volume: (cm) 0.094 % Reduction in Area: 64.7% % Reduction in Volume: 64.8% Epithelialization: None Tunneling: No Undermining: No Wound Description Full Thickness Without Exposed Classification: Support Structures Wound Margin: Distinct, outline attached Exudate Medium Amount: Exudate Type: Serosanguineous Exudate Color: red, brown Foul Odor After Cleansing: No Slough/Fibrino Yes Wound Bed Granulation Amount: Medium (34-66%) Exposed Structure Granulation Quality: Pink Fascia Exposed: No Necrotic Amount: Medium (34-66%) Fat Layer (Subcutaneous Tissue) Exposed: Yes Necrotic Quality: Adherent Slough Tendon Exposed: No Karen Bowman, Karen Bowman ([id number]) Muscle Exposed: No Joint Exposed: No Bone Exposed: No Periwound Skin Texture Texture Color No Abnormalities Noted: No No Abnormalities Noted: No Callus: No Atrophie Blanche: No Crepitus: No Cyanosis: No Excoriation: Yes Ecchymosis: No Induration: No Erythema: Yes Rash: No Erythema Location: Circumferential Scarring: No Hemosiderin Staining: Yes Mottled: Yes Moisture Pallor: No No Abnormalities Noted: No Rubor: No Dry / Scaly: No Maceration: No Temperature / Pain Temperature: No Abnormality Tenderness on Palpation: Yes Wound Preparation Ulcer Cleansing: Rinsed/Irrigated with Saline, Other: soap and water, Topical Anesthetic Applied: Other: lidocaine 4%, Treatment Notes Wound #1  (Right, Medial Ankle) 1. Cleansed with: Cleanse wound with antibacterial soap and water 2. Anesthetic Topical Lidocaine 4% cream to wound bed prior to debridement 3. Peri-wound Care: Antifungal cream Moisturizing lotion 4. Dressing Applied: Prisma Ag 5. Secondary Dressing Applied ABD Pad 7. Secured with 3 Layer Compression System - Right Lower Extremity Notes TCA Electronic Signature(s) Signed: 09/18/2016 8:26:41 AM By: Gretta Cool, BSN, RN, CWS, Kim RN, BSN Entered By: Gretta Cool, BSN, RN, CWS, Kim on 09/17/2016 16:10:47 Karen Bowman ([id number]) Karen Bowman ([id number]) -------------------------------------------------------------------------------- [place] Details Patient Name: Karen Bowman Date of Service: 09/17/2016 3:45 PM Medical Record Number: [id number] Patient Account Number: [id number] Date of Birth/Sex: 07/27/1940 (76 y.o. Female) Treating RN: Cornell Barman Primary Care Britaney Espaillat: [name] Other Clinician: Referring Darell Saputo: [name] Treating Yeimy Brabant/Extender: Tito Dine in Treatment: 3 Vital Signs Time Taken: 16:04 Temperature (F): 98.1 Height (in): 65 Pulse (bpm): 60 Weight (lbs): 132 Respiratory Rate (breaths/min): 16 Body Mass Index (BMI): 22 Blood Pressure (mmHg): 140/60 Reference Range: 80 - 120 mg / dl Electronic Signature(s) Signed: 09/18/2016 8:26:41 AM By: Gretta Cool, BSN, RN, CWS, Kim RN, BSN Entered By: Gretta Cool, BSN, RN, CWS, Kim on 09/17/2016 16:04:42

## 2016-09-24 ENCOUNTER — Encounter: Payer: Medicare PPO | Admitting: Internal Medicine

## 2016-09-24 DIAGNOSIS — I87331 Chronic venous hypertension (idiopathic) with ulcer and inflammation of right lower extremity: Secondary | ICD-10-CM | POA: Diagnosis not present

## 2016-09-25 NOTE — Progress Notes (Signed)
Karen Bowman, Karen Bowman ([id number]) Visit Report for 09/24/2016 Chief Complaint Document Details Patient Name: Karen Bowman, Karen Bowman Date of Service: 09/24/2016 10:15 AM Medical Record Patient Account Number: [id number] Number: Treating RN: Cornell Barman April 01, 1941 (76 y.o. Other Clinician: Date of Birth/Sex: Female) Treating ROBSON, MICHAEL Primary Care Provider: [name] Provider/Extender: G Referring Provider: Leslye Peer in Treatment: 4 Information Obtained from: Patient Chief Complaint 08/27/16; patient is here for review of a chronic wound on the right medial ankle that has been present for one year Electronic Signature(s) Signed: 09/24/2016 4:49:17 PM By: Linton Ham MD Entered By: Linton Ham on 09/24/2016 12:52:20 Karen Bowman ([id number]) -------------------------------------------------------------------------------- Debridement Details Patient Name: Karen Bowman Date of Service: 09/24/2016 10:15 AM Medical Record Patient Account Number: [id number] Number: Treating RN: Cornell Barman 02-20-1941 (76 y.o. Other Clinician: Date of Birth/Sex: Female) Treating ROBSON, MICHAEL Primary Care Provider: Diona Browner, Amy Provider/Extender: G Referring Provider: Leslye Peer in Treatment: 4 Debridement Performed for Wound #1 Right,Medial Ankle Assessment: Performed By: Physician Ricard Dillon, MD Debridement: Debridement Pre-procedure Verification/Time Out Yes - 10:55 Taken: Start Time: 10:56 Pain Control: Lidocaine 4% Topical Solution Level: Skin/Subcutaneous Tissue Total Area Debrided (L x 0.3 (cm) x 0.5 (cm) = 0.15 (cm) W): Tissue and other Viable, Non-Viable, Fibrin/Slough, Subcutaneous material debrided: Instrument: Curette Bleeding: Minimum Hemostasis Achieved: Pressure End Time: 10:58 Procedural Pain: 0 Post Procedural Pain: 0 Response to Treatment: Procedure was tolerated well Post Debridement Measurements of  Total Wound Length: (cm) 0.3 Width: (cm) 0.5 Depth: (cm) 0.2 Volume: (cm) 0.024 Character of Wound/Ulcer Post Requires Further Debridement Debridement: Post Procedure Diagnosis Same as Pre-procedure Electronic Signature(s) Signed: 09/24/2016 4:42:12 PM By: Gretta Cool, BSN, RN, CWS, Kim RN, BSN Signed: 09/24/2016 4:49:17 PM By: Linton Ham MD Entered By: Linton Ham on 09/24/2016 12:52:13 Karen Bowman ([name] ([id number]) -------------------------------------------------------------------------------- HPI Details Patient Name: Karen Bowman Date of Service: 09/24/2016 10:15 AM Medical Record Patient Account Number: [id number] Number: Treating RN: Cornell Barman 07-17-1940 (76 y.o. Other Clinician: Date of Birth/Sex: Female) Treating ROBSON, MICHAEL Primary Care Provider: [name] Provider/Extender: G Referring Provider: Leslye Peer in Treatment: 4 History of Present Illness HPI Description: 08/27/16; his is a 76 year old woman who has been dealing with a ulcer on her right medial ankle for about a year. She is uncertain about how this actually opened however it is not really changed that much in quite some time. She has a history of bleeding per Deniece Ree these on the left more remotely but no other ulcer history. She is not a diabetic but she does have chronic right leg greater than left leg swelling. Looking through [employer] she has a history of eczema, systolic congestive heart failure left CVA and paroxysmal A. fib on Eliquis. She has been using Neosporin and more recently Bactroban without improvement. She saw her primary physician who referred her here, notes they're suggesting Coban and gauze. Dimensions of the wounds 1.7 x 2 x 0.1. ABIs in this clinic were 1.0 bilaterally. 09/03/16; wound dimensions are smaller. Most of the wound still covered in an adherent necrotic surface. We have been using Santyl under  compression. Patient complains of pain and discomfort 09/10/16; wound dimensions on the right medial malleolus are improved. Condition of the wound bed is improved. We have been using Santyl under compression. She has chronic venous inflammation around the wound which is tender. 09/17/16; 1 x 1.2 x 0.1. Surface of this looks better. No debridement. No evidence of surrounding  infection. For some reason her reflux studies are booked for late July although she appears to be on some sort of cancellation list 09/24/16; the wound is about half the size this week. Still some nonviable tissue around the circumference and some necrotic material over the surface of the wound that were debridement today but overall this looks like it is well on its way to closure. Electronic Signature(s) Signed: 09/24/2016 4:49:17 PM By: Linton Ham MD Entered By: Linton Ham on 09/24/2016 12:53:04 Karen Bowman ([id number]) -------------------------------------------------------------------------------- Physical Exam Details Patient Name: Karen Bowman Date of Service: 09/24/2016 10:15 AM Medical Record Patient Account Number: [id number] Number: Treating RN: Cornell Barman 1940-05-15 (76 y.o. Other Clinician: Date of Birth/Sex: Female) Treating ROBSON, MICHAEL Primary Care Provider: [name] Provider/Extender: G Referring Provider: [name] Weeks in Treatment: 4 Constitutional Sitting or standing Blood Pressure is within target range for patient.. Pulse regular and within target range for patient.Marland Kitchen Respirations regular, non-labored and within target range.. Temperature is normal and within the target range for the patient.Marland Kitchen appears in no distress. Notes Wound exam; this wound continues to contract. Debridement with a #3 curet to remove adherent necrotic material and eschar from around the circumference. There is no evidence of surrounding infection. Hemostasis with direct pressure.  Wound looks quite satisfactory post debridement Electronic Signature(s) Signed: 09/24/2016 4:49:17 PM By: Linton Ham MD Entered By: Linton Ham on 09/24/2016 12:54:03 Karen Bowman ([id number]) -------------------------------------------------------------------------------- Physician Orders Details Patient Name: Karen Bowman Date of Service: 09/24/2016 10:15 AM Medical Record Patient Account Number: [id number] Number: Treating RN: Cornell Barman Jul 26, 1940 (76 y.o. Other Clinician: Date of Birth/Sex: Female) Treating ROBSON, MICHAEL Primary Care Provider: [name] Provider/Extender: G Referring Provider: Leslye Peer in Treatment: 4 Verbal / Phone Orders: No Diagnosis Coding Wound Cleansing Wound #1 Right,Medial Ankle o Clean wound with Normal Saline. o May shower with protection. - USE Karen Bowman PROTECTOR o No tub bath. Anesthetic Wound #1 Right,Medial Ankle o Topical Lidocaine 4% cream applied to wound bed prior to debridement Skin Barriers/Peri-Wound Care Wound #1 Right,Medial Ankle o Triamcinolone Acetonide Ointment Primary Wound Dressing Wound #1 Right,Medial Ankle o Prisma Ag Secondary Dressing Wound #1 Right,Medial Ankle o ABD pad Dressing Change Frequency Wound #1 Right,Medial Ankle o Change dressing every week Follow-up Appointments Wound #1 Right,Medial Ankle o Return Appointment in 1 week. Edema Control Wound #1 Right,Medial Ankle o 3 Layer Compression System - Right Lower Extremity Karen Bowman, Karen Bowman ([id number]) o Elevate legs to the level of the heart and pump ankles as often as possible Additional Orders / Instructions Wound #1 Right,Medial Ankle o Increase protein intake. o Activity as tolerated Electronic Signature(s) Signed: 09/24/2016 4:42:12 PM By: Gretta Cool, BSN, RN, CWS, Kim RN, BSN Signed: 09/24/2016 4:49:17 PM By: Linton Ham MD Entered By: Gretta Cool, BSN, RN, CWS, Kim on 09/24/2016  10:58:48 Karen Bowman ([id number]) -------------------------------------------------------------------------------- Problem List Details Patient Name: MALANEY, MCBEAN Date of Service: 09/24/2016 10:15 AM Medical Record Patient Account Number: [id number] Number: Treating RN: Cornell Barman [birth year] (76 y.o. Other Clinician: Date of Birth/Sex: Female) Treating ROBSON, MICHAEL Primary Care Provider: [name] Provider/Extender: G Referring Provider: Leslye Peer in Treatment: 4 Active Problems ICD-10 Encounter Code Description Active Date Diagnosis L97.311 Non-pressure chronic ulcer of right ankle limited to 08/27/2016 Yes breakdown of skin I87.331 Chronic venous hypertension (idiopathic) with ulcer and 08/27/2016 Yes inflammation of right lower extremity Inactive Problems Resolved Problems Electronic Signature(s) Signed: 09/24/2016 4:49:17 PM By: Linton Ham MD Entered By:  Linton Ham on 09/24/2016 12:48:40 Karen Bowman ([id number]) -------------------------------------------------------------------------------- Progress Note Details Patient Name: Karen Bowman, Karen Bowman Date of Service: 09/24/2016 10:15 AM Medical Record Patient Account Number: [id number] Number: Treating RN: Cornell Barman 01-07-1941 (76 y.o. Other Clinician: Date of Birth/Sex: Female) Treating ROBSON, MICHAEL Primary Care Provider: [name] Provider/Extender: G Referring Provider: Leslye Peer in Treatment: 4 Subjective Chief Complaint Information obtained from Patient 08/27/16; patient is here for review of a chronic wound on the right medial ankle that has been present for one year History of Present Illness (HPI) 08/27/16; his is a 76 year old woman who has been dealing with a ulcer on her right medial ankle for about a year. She is uncertain about how this actually opened however it is not really changed that much in quite some time. She has a history  of bleeding per Deniece Ree these on the left more remotely but no other ulcer history. She is not a diabetic but she does have chronic right leg greater than left leg swelling. Looking through [employer] she has a history of eczema, systolic congestive heart failure left CVA and paroxysmal A. fib on Eliquis. She has been using Neosporin and more recently Bactroban without improvement. She saw her primary physician who referred her here, notes they're suggesting Coban and gauze. Dimensions of the wounds 1.7 x 2 x 0.1. ABIs in this clinic were 1.0 bilaterally. 09/03/16; wound dimensions are smaller. Most of the wound still covered in an adherent necrotic surface. We have been using Santyl under compression. Patient complains of pain and discomfort 09/10/16; wound dimensions on the right medial malleolus are improved. Condition of the wound bed is improved. We have been using Santyl under compression. She has chronic venous inflammation around the wound which is tender. 09/17/16; 1 x 1.2 x 0.1. Surface of this looks better. No debridement. No evidence of surrounding infection. For some reason her reflux studies are booked for late July although she appears to be on some sort of cancellation list 09/24/16; the wound is about half the size this week. Still some nonviable tissue around the circumference and some necrotic material over the surface of the wound that were debridement today but overall this looks like it is well on its way to closure. Karen Bowman ([id number]) Objective Constitutional Sitting or standing Blood Pressure is within target range for patient.. Pulse regular and within target range for patient.Marland Kitchen Respirations regular, non-labored and within target range.. Temperature is normal and within the target range for the patient.Marland Kitchen appears in no distress. Vitals Time Taken: 10:27 AM, Height: 65 in, Weight: 132 lbs, BMI: 22, Temperature: 98.1 F, Pulse: 60 bpm, Respiratory Rate: 16  breaths/min, Blood Pressure: 122/62 mmHg. General Notes: Wound exam; this wound continues to contract. Debridement with a #3 curet to remove adherent necrotic material and eschar from around the circumference. There is no evidence of surrounding infection. Hemostasis with direct pressure. Wound looks quite satisfactory post debridement Integumentary (Hair, Skin) Wound #1 status is Open. Original cause of wound was Gradually Appeared. The wound is located on the Right,Medial Ankle. The wound measures 0.3cm length x 0.5cm width x 0.1cm depth; 0.118cm^2 area and 0.012cm^3 volume. There is Fat Layer (Subcutaneous Tissue) Exposed exposed. There is no tunneling or undermining noted. There is a medium amount of serosanguineous drainage noted. The wound margin is distinct with the outline attached to the wound base. There is medium (34-66%) pink granulation within the wound bed. There is a medium (34-66%) amount of necrotic tissue  within the wound bed including Adherent Slough. The periwound skin appearance exhibited: Dry/Scaly, Hemosiderin Staining, Mottled. The periwound skin appearance did not exhibit: Callus, Crepitus, Excoriation, Induration, Rash, Scarring, Maceration, Atrophie Blanche, Cyanosis, Ecchymosis, Pallor, Rubor, Erythema. Periwound temperature was noted as No Abnormality. The periwound has tenderness on palpation. Assessment Active Problems ICD-10 B35.329 - Non-pressure chronic ulcer of right ankle limited to breakdown of skin I87.331 - Chronic venous hypertension (idiopathic) with ulcer and inflammation of right lower extremity Procedures Wound #1 Pre-procedure diagnosis of Wound #1 is a Vasculopathy located on the Right,Medial Ankle . There was a Karen Bowman, Karen Bowman ([id number]) Skin/Subcutaneous Tissue Debridement [phone]) debridement with total area of 0.15 sq cm performed by Ricard Dillon, MD. with the following instrument(s): Curette to remove Viable and Non-Viable  tissue/material including Fibrin/Slough and Subcutaneous after achieving pain control using Lidocaine 4% Topical Solution. A time out was conducted at 10:55, prior to the start of the procedure. A Minimum amount of bleeding was controlled with Pressure. The procedure was tolerated well with a pain level of 0 throughout and a pain level of 0 following the procedure. Post Debridement Measurements: 0.3cm length x 0.5cm width x 0.2cm depth; 0.024cm^3 volume. Character of Wound/Ulcer Post Debridement requires further debridement. Post procedure Diagnosis Wound #1: Same as Pre-Procedure Plan Wound Cleansing: Wound #1 Right,Medial Ankle: Clean wound with Normal Saline. May shower with protection. - USE Karen Bowman PROTECTOR No tub bath. Anesthetic: Wound #1 Right,Medial Ankle: Topical Lidocaine 4% cream applied to wound bed prior to debridement Skin Barriers/Peri-Wound Care: Wound #1 Right,Medial Ankle: Triamcinolone Acetonide Ointment Primary Wound Dressing: Wound #1 Right,Medial Ankle: Prisma Ag Secondary Dressing: Wound #1 Right,Medial Ankle: ABD pad Dressing Change Frequency: Wound #1 Right,Medial Ankle: Change dressing every week Follow-up Appointments: Wound #1 Right,Medial Ankle: Return Appointment in 1 week. Edema Control: Wound #1 Right,Medial Ankle: 3 Layer Compression System - Right Lower Extremity Elevate legs to the level of the heart and pump ankles as often as possible Additional Orders / Instructions: Wound #1 Right,Medial Ankle: Increase protein intake. Activity as tolerated Karen Bowman, Karen Bowman ([id number]) Continue with Prisma, ABDs 3 layer compression The patient's has had a difficult time with the wound in this area which started as a bleeding blood vessel while on anticoagulants and aspirin. I am not exactly sure what she will need in terms of post wound prophylaxis in terms of stockings although I would like to review this next week. Electronic Signature(s) Signed:  09/24/2016 4:49:17 PM By: Linton Ham MD Entered By: Linton Ham on 09/24/2016 12:55:13 Karen Bowman ([id number]) -------------------------------------------------------------------------------- SuperBill Details Patient Name: Karen Bowman Date of Service: 09/24/2016 Medical Record Patient Account Number: [id number] Number: Treating RN: Cornell Barman [birth year] (76 y.o. Other Clinician: Date of Birth/Sex: Female) Treating ROBSON, MICHAEL Primary Care Provider: [name] Provider/Extender: G Referring Provider: [name] Weeks in Treatment: 4 Diagnosis Coding ICD-10 Codes Code Description H88.502 Non-pressure chronic ulcer of right ankle limited to breakdown of skin Chronic venous hypertension (idiopathic) with ulcer and inflammation of right lower I87.331 extremity Facility Procedures CPT4: Description Modifier Quantity Code [id number] - DEB SUBQ TISSUE 20 SQ CM/< 1 ICD-10 Description Diagnosis L97.311 Non-pressure chronic ulcer of right ankle limited to breakdown of skin I87.331 Chronic venous hypertension (idiopathic) with ulcer  and inflammation of right lower extremity Physician Procedures CPT4: Description Modifier Quantity Code [id number] - WC PHYS SUBQ TISS 20 SQ CM 1 ICD-10 Description Diagnosis L97.311 Non-pressure chronic ulcer of right ankle limited to breakdown of skin I87.331  Chronic venous hypertension (idiopathic) with ulcer  and inflammation of right lower extremity Electronic Signature(s) Signed: 09/24/2016 4:49:17 PM By: Linton Ham MD Entered By: Linton Ham on 09/24/2016 12:55:26

## 2016-09-25 NOTE — Progress Notes (Signed)
JOEY, HUDOCK ([id number]) Visit Report for 09/24/2016 Arrival Information Details Patient Name: Karen Bowman, Karen Bowman Date of Service: 09/24/2016 10:15 AM Medical Record Number: [id number] Patient Account Number: [id number] Date of Birth/Sex: 05/29/1940 (76 y.o. Female) Treating RN: Cornell Barman Primary Care Mohamadou Maciver: [name] Other Clinician: Referring Cameran Pettey: [name] Treating Payden Docter/Extender: Tito Dine in Treatment: 4 Visit Information History Since Last Visit Added or deleted any medications: No Patient Arrived: Karen Bowman Any new allergies or adverse reactions: No Arrival Time: 10:26 Had a fall or experienced change in No Accompanied By: self activities of daily living that may affect Transfer Assistance: None risk of falls: Patient Identification Verified: Yes Signs or symptoms of abuse/neglect No Secondary Verification Process Completed: Yes since last visito Patient Requires Transmission-Based No Hospitalized since last visit: No Precautions: Has Dressing in Place as Prescribed: Yes Patient Has Alerts: No Has Compression in Place as Yes Prescribed: Has Footwear/Offloading in Place as Yes Prescribed: Right: Surgical Shoe with Pressure Relief Insole Pain Present Now: No Electronic Signature(s) Signed: 09/24/2016 4:42:12 PM By: Gretta Cool, BSN, RN, CWS, Kim RN, BSN Entered By: Gretta Cool, BSN, RN, CWS, Kim on 09/24/2016 10:27:09 Karen Bowman ([id number]) -------------------------------------------------------------------------------- Encounter Discharge Information Details Patient Name: Karen Bowman Date of Service: 09/24/2016 10:15 AM Medical Record Number: [id number] Patient Account Number: [id number] Date of Birth/Sex: 03-26-1941 (76 y.o. Female) Treating RN: Cornell Barman Primary Care Josepha Barbier: [name] Other Clinician: Referring Apolonio Cutting: [name] Treating Doaa Kendzierski/Extender: Tito Dine in Treatment: 4 Encounter  Discharge Information Items Discharge Pain Level: 0 Discharge Condition: Stable Ambulatory Status: Cane Discharge Destination: Home Private Transportation: Auto Accompanied By: self Schedule Follow-up Appointment: Yes Medication Reconciliation completed and Yes provided to Patient/Care Owens Hara: Clinical Summary of Care: Electronic Signature(s) Signed: 09/24/2016 4:42:12 PM By: Gretta Cool, BSN, RN, CWS, Kim RN, BSN Entered By: Gretta Cool, BSN, RN, CWS, Kim on 09/24/2016 11:10:27 Karen Bowman ([id number]) -------------------------------------------------------------------------------- Lower Extremity Assessment Details Patient Name: Karen Bowman Date of Service: 09/24/2016 10:15 AM Medical Record Number: [id number] Patient Account Number: [id number] Date of Birth/Sex: 12-Sep-1940 (76 y.o. Female) Treating RN: Cornell Barman Primary Care Ayline Dingus: [name] Other Clinician: Referring Cadi Rhinehart: [name] Treating Zyion Doxtater/Extender: Ricard Dillon Weeks in Treatment: 4 Edema Assessment Assessed: [Left: No] [Right: No] E[Left: dema] [Right: :] Calf Left: Right: Point of Measurement: 35 cm From Medial Instep cm 32 cm Ankle Left: Right: Point of Measurement: 9 cm From Medial Instep cm 21.4 cm Vascular Assessment Pulses: Dorsalis Pedis Palpable: [Right:Yes] Posterior Tibial Extremity colors, hair growth, and conditions: Extremity Color: [Right:Normal] Hair Growth on Extremity: [Right:Yes] Temperature of Extremity: [Right:Cool] Capillary Refill: [Right:< 3 seconds] Dependent Rubor: [Right:No] Blanched when Elevated: [Right:No] Lipodermatosclerosis: [Right:No] Toe Nail Assessment Left: Right: Thick: No Discolored: No Deformed: No Improper Length and Hygiene: No Electronic Signature(s) Signed: 09/24/2016 4:42:12 PM By: Gretta Cool, BSN, RN, CWS, Kim RN, BSN Karen Bowman, Karen Bowman ([id number]) Entered By: Gretta Cool, BSN, RN, CWS, Kim on 09/24/2016 10:38:35 Karen Bowman  ([id number]) -------------------------------------------------------------------------------- Multi Wound Chart Details Patient Name: Karen Bowman Date of Service: 09/24/2016 10:15 AM Medical Record Number: [id number] Patient Account Number: [id number] Date of Birth/Sex: [birth year] (76 y.o. Female) Treating RN: Cornell Barman Primary Care Jatavis Malek: [name] Other Clinician: Referring Novalie Leamy: [name] Treating Karmel Patricelli/Extender: Ricard Dillon Weeks in Treatment: 4 Vital Signs Height(in): 65 Pulse(bpm): 60 Weight(lbs): 132 Blood Pressure 122/62 (mmHg): Body Mass Index(BMI): 22 Temperature(F): 98.1 Respiratory Rate 16 (breaths/min): Photos: [N/A:N/A] Wound Location: Right Ankle - Medial N/A N/A Wounding Event: Gradually  Appeared N/A N/A Primary Etiology: Vasculopathy N/A N/A Comorbid History: Arrhythmia, Coronary N/A N/A Artery Disease, Peripheral Arterial Disease, Peripheral Venous Disease, Osteoarthritis, Neuropathy Date Acquired: 09/30/2015 N/A N/A Weeks of Treatment: 4 N/A N/A Wound Status: Open N/A N/A Measurements L x W x D 0.3x0.5x0.1 N/A N/A (cm) Area (cm) : 0.118 N/A N/A Volume (cm) : 0.012 N/A N/A % Reduction in Area: 95.60% N/A N/A % Reduction in Volume: 95.50% N/A N/A Classification: Full Thickness Without N/A N/A Exposed Support Structures Exudate Amount: Medium N/A N/A Exudate Type: Serosanguineous N/A N/A Exudate Color: red, brown N/A N/A Wound Margin: Distinct, outline attached N/A N/A Karen Bowman ([id number]) Granulation Amount: Medium (34-66%) N/A N/A Granulation Quality: Pink N/A N/A Necrotic Amount: Medium (34-66%) N/A N/A Exposed Structures: Fat Layer (Subcutaneous N/A N/A Tissue) Exposed: Yes Fascia: No Tendon: No Muscle: No Joint: No Bone: No Epithelialization: None N/A N/A Debridement: Debridement ([id number]- N/A N/A 11047) Pre-procedure 10:55 N/A N/A Verification/Time Out Taken: Pain Control: Lidocaine 4%  Topical N/A N/A Solution Tissue Debrided: Fibrin/Slough, N/A N/A Subcutaneous Level: Skin/Subcutaneous N/A N/A Tissue Debridement Area (sq 0.15 N/A N/A cm): Instrument: Curette N/A N/A Bleeding: Minimum N/A N/A Hemostasis Achieved: Pressure N/A N/A Procedural Pain: 0 N/A N/A Post Procedural Pain: 0 N/A N/A Debridement Treatment Procedure was tolerated N/A N/A Response: well Post Debridement 0.3x0.5x0.2 N/A N/A Measurements L x W x D (cm) Post Debridement 0.024 N/A N/A Volume: (cm) Periwound Skin Texture: Excoriation: No N/A N/A Induration: No Callus: No Crepitus: No Rash: No Scarring: No Periwound Skin Dry/Scaly: Yes N/A N/A Moisture: Maceration: No Periwound Skin Color: Hemosiderin Staining: Yes N/A N/A Mottled: Yes Atrophie Blanche: No Cyanosis: No Ecchymosis: No Erythema: No Karen Bowman, Karen Bowman ([id number]) Pallor: No Rubor: No Temperature: No Abnormality N/A N/A Tenderness on Yes N/A N/A Palpation: Wound Preparation: Ulcer Cleansing: N/A N/A Rinsed/Irrigated with Saline, Other: soap and water Topical Anesthetic Applied: Other: lidocaine 4% Procedures Performed: Debridement N/A N/A Treatment Notes Wound #1 (Right, Medial Ankle) 1. Cleansed with: Cleanse wound with antibacterial soap and water 2. Anesthetic Topical Lidocaine 4% cream to wound bed prior to debridement 4. Dressing Applied: Prisma Ag 5. Secondary Dressing Applied ABD Pad 6. Footwear/Offloading device applied Surgical shoe 7. Secured with 3 Layer Compression System - Right Lower Extremity Notes TCA Electronic Signature(s) Signed: 09/24/2016 4:49:17 PM By: Linton Ham MD Entered By: Linton Ham on 09/24/2016 12:48:46 Karen Bowman ([id number]) -------------------------------------------------------------------------------- [place] Details Patient Name: Karen Bowman Date of Service: 09/24/2016 10:15 AM Medical Record Number:  [id number] Patient Account Number: [id number] Date of Birth/Sex: December 09, 1940 (76 y.o. Female) Treating RN: Cornell Barman Primary Care Payal Stanforth: [name] Other Clinician: Referring Nasiya Pascual: [name] Treating Lela Gell/Extender: Tito Dine in Treatment: 4 Active Inactive ` Orientation to the Wound Care Program Nursing Diagnoses: Knowledge deficit related to the wound healing center program Goals: Patient/caregiver will verbalize understanding of the [place] Program Date Initiated: 08/27/2016 Target Resolution Date: 01/27/2017 Goal Status: Active Interventions: Provide education on orientation to the wound center Notes: ` Venous Leg Ulcer Nursing Diagnoses: Knowledge deficit related to disease process and management Potential for venous Insuffiency (use before diagnosis confirmed) Goals: Non-invasive venous studies are completed as ordered Date Initiated: 08/27/2016 Target Resolution Date: 01/27/2017 Goal Status: Active Patient will maintain optimal edema control Date Initiated: 08/27/2016 Target Resolution Date: 01/27/2017 Goal Status: Active Patient/caregiver will verbalize understanding of disease process and disease management Date Initiated: 08/27/2016 Target Resolution Date: 01/27/2017 Goal Status: Active Verify adequate tissue perfusion  prior to therapeutic compression application Date Initiated: 08/27/2016 Target Resolution Date: 01/27/2017 Goal Status: Active Karen Bowman, Karen Bowman ([id number]) Interventions: Assess peripheral edema status every visit. Compression as ordered Provide education on venous insufficiency Treatment Activities: Non-invasive vascular studies : 08/27/2016 Test ordered outside of clinic : 08/27/2016 Therapeutic compression applied : 08/27/2016 Venous Duplex Doppler : 08/27/2016 Notes: ` Wound/Skin Impairment Nursing Diagnoses: Impaired tissue integrity Knowledge deficit related to ulceration/compromised skin  integrity Goals: Patient/caregiver will verbalize understanding of skin care regimen Date Initiated: 08/27/2016 Target Resolution Date: 01/27/2017 Goal Status: Active Ulcer/skin breakdown will have a volume reduction of 30% by week 4 Date Initiated: 08/27/2016 Target Resolution Date: 01/27/2017 Goal Status: Active Ulcer/skin breakdown will have a volume reduction of 50% by week 8 Date Initiated: 08/27/2016 Target Resolution Date: 01/27/2017 Goal Status: Active Ulcer/skin breakdown will have a volume reduction of 80% by week 12 Date Initiated: 08/27/2016 Target Resolution Date: 01/27/2017 Goal Status: Active Ulcer/skin breakdown will heal within 14 weeks Date Initiated: 08/27/2016 Target Resolution Date: 01/27/2017 Goal Status: Active Interventions: Assess patient/caregiver ability to obtain necessary supplies Assess patient/caregiver ability to perform ulcer/skin care regimen upon admission and as needed Assess ulceration(s) every visit Provide education on ulcer and skin care Treatment Activities: Skin care regimen initiated : 08/27/2016 Karen Bowman ([id number]) Topical wound management initiated : 08/27/2016 Notes: Electronic Signature(s) Signed: 09/24/2016 4:42:12 PM By: Gretta Cool, BSN, RN, CWS, Kim RN, BSN Entered By: Gretta Cool, BSN, RN, CWS, Kim on 09/24/2016 10:56:09 Karen Bowman ([id number]) -------------------------------------------------------------------------------- Pain Assessment Details Patient Name: Karen Bowman Date of Service: 09/24/2016 10:15 AM Medical Record Number: [id number] Patient Account Number: [id number] Date of Birth/Sex: 04/06/1940 (76 y.o. Female) Treating RN: Cornell Barman Primary Care Praneeth Bussey: [name] Other Clinician: Referring Zasha Belleau: [name] Treating Sashay Felling/Extender: Tito Dine in Treatment: 4 Active Problems Location of Pain Severity and Description of Pain Patient Has Paino No Site Locations With  Dressing Change: No Pain Management and Medication Current Pain Management: Goals for Pain Management Topical or injectable lidocaine is offered to patient for acute pain when surgical debridement is performed. If needed, Patient is instructed to use over the counter pain medication for the following 24-48 hours after debridement. Wound care MDs do not prescribed pain medications. Patient has chronic pain or uncontrolled pain. Patient has been instructed to make an appointment with their Primary Care Physician for pain management. Electronic Signature(s) Signed: 09/24/2016 4:42:12 PM By: Gretta Cool, BSN, RN, CWS, Kim RN, BSN Entered By: Gretta Cool, BSN, RN, CWS, Kim on 09/24/2016 10:27:18 Karen Bowman ([id number]) -------------------------------------------------------------------------------- Patient/Caregiver Education Details Patient Name: Karen Bowman Date of Service: 09/24/2016 10:15 AM Medical Record Patient Account Number: [id number] Number: Treating RN: Cornell Barman 02/25/1941 (76 y.o. Other Clinician: Date of Birth/Gender: Female) Treating ROBSON, MICHAEL Primary Care Physician: [name] Physician/Extender: G Referring Physician: Leslye Peer in Treatment: 4 Education Assessment Education Provided To: Patient Education Topics Provided Venous: Handouts: Controlling Swelling with Multilayered Compression Wraps Methods: Demonstration Responses: State content correctly Wound/Skin Impairment: Handouts: Caring for Your Ulcer Methods: Demonstration Responses: State content correctly Electronic Signature(s) Signed: 09/24/2016 4:42:12 PM By: Gretta Cool, BSN, RN, CWS, Kim RN, BSN Entered By: Gretta Cool, BSN, RN, CWS, Kim on 09/24/2016 11:13:16 Karen Bowman ([id number]) -------------------------------------------------------------------------------- Wound Assessment Details Patient Name: Karen Bowman Date of Service: 09/24/2016 10:15 AM Medical Record  Number: [id number] Patient Account Number: [id number] Date of Birth/Sex: 18-Feb-1941 (76 y.o. Female) Treating RN: Cornell Barman Primary Care Keaja Reaume: [name] Other Clinician: Referring Garion Wempe: [name]  Treating Deshan Hemmelgarn/Extender: Ricard Dillon Weeks in Treatment: 4 Wound Status Wound Number: 1 Primary Vasculopathy Etiology: Wound Location: Right Ankle - Medial Wound Open Wounding Event: Gradually Appeared Status: Date Acquired: 09/30/2015 Comorbid Arrhythmia, Coronary Artery Disease, Weeks Of Treatment: 4 History: Peripheral Arterial Disease, Peripheral Clustered Wound: No Venous Disease, Osteoarthritis, Neuropathy Photos Wound Measurements Length: (cm) 0.3 Width: (cm) 0.5 Depth: (cm) 0.1 Area: (cm) 0.118 Volume: (cm) 0.012 % Reduction in Area: 95.6% % Reduction in Volume: 95.5% Epithelialization: None Tunneling: No Undermining: No Wound Description Full Thickness Without Exposed Classification: Support Structures Wound Margin: Distinct, outline attached Exudate Medium Amount: Exudate Type: Serosanguineous Exudate Color: red, brown Foul Odor After Cleansing: No Slough/Fibrino Yes Wound Bed Granulation Amount: Medium (34-66%) Exposed Structure Granulation Quality: Pink Fascia Exposed: No Necrotic Amount: Medium (34-66%) Fat Layer (Subcutaneous Tissue) Exposed: Yes Necrotic Quality: Adherent Slough Tendon Exposed: No ELLAN, TESS ([id number]) Muscle Exposed: No Joint Exposed: No Bone Exposed: No Periwound Skin Texture Texture Color No Abnormalities Noted: No No Abnormalities Noted: No Callus: No Atrophie Blanche: No Crepitus: No Cyanosis: No Excoriation: No Ecchymosis: No Induration: No Erythema: No Rash: No Hemosiderin Staining: Yes Scarring: No Mottled: Yes Pallor: No Moisture Rubor: No No Abnormalities Noted: No Dry / Scaly: Yes Temperature / Pain Maceration: No Temperature: No Abnormality Tenderness on Palpation:  Yes Wound Preparation Ulcer Cleansing: Rinsed/Irrigated with Saline, Other: soap and water, Topical Anesthetic Applied: Other: lidocaine 4%, Treatment Notes Wound #1 (Right, Medial Ankle) 1. Cleansed with: Cleanse wound with antibacterial soap and water 2. Anesthetic Topical Lidocaine 4% cream to wound bed prior to debridement 4. Dressing Applied: Prisma Ag 5. Secondary Dressing Applied ABD Pad 6. Footwear/Offloading device applied Surgical shoe 7. Secured with 3 Layer Compression System - Right Lower Extremity Notes TCA Electronic Signature(s) Signed: 09/24/2016 4:42:12 PM By: Gretta Cool, BSN, RN, CWS, Kim RN, BSN Entered By: Gretta Cool, BSN, RN, CWS, Kim on 09/24/2016 10:36:50 Karen Bowman ([id number]) -------------------------------------------------------------------------------- Vitals Details Patient Name: Karen Bowman Date of Service: 09/24/2016 10:15 AM Medical Record Number: [id number] Patient Account Number: [id number] Date of Birth/Sex: 1941-03-30 (76 y.o. Female) Treating RN: Cornell Barman Primary Care Joel Cowin: [name] Other Clinician: Referring Raequan Vanschaick: [name] Treating Jernie Schutt/Extender: Tito Dine in Treatment: 4 Vital Signs Time Taken: 10:27 Temperature (F): 98.1 Height (in): 65 Pulse (bpm): 60 Weight (lbs): 132 Respiratory Rate (breaths/min): 16 Body Mass Index (BMI): 22 Blood Pressure (mmHg): 122/62 Reference Range: 80 - 120 mg / dl Electronic Signature(s) Signed: 09/24/2016 4:42:12 PM By: Gretta Cool, BSN, RN, CWS, Kim RN, BSN Entered By: Gretta Cool, BSN, RN, CWS, Kim on 09/24/2016 10:29:43

## 2016-10-01 ENCOUNTER — Encounter: Payer: Medicare PPO | Attending: Internal Medicine | Admitting: Internal Medicine

## 2016-10-01 DIAGNOSIS — I5022 Chronic systolic (congestive) heart failure: Secondary | ICD-10-CM | POA: Diagnosis not present

## 2016-10-01 DIAGNOSIS — I2510 Atherosclerotic heart disease of native coronary artery without angina pectoris: Secondary | ICD-10-CM | POA: Diagnosis not present

## 2016-10-01 DIAGNOSIS — I739 Peripheral vascular disease, unspecified: Secondary | ICD-10-CM | POA: Insufficient documentation

## 2016-10-01 DIAGNOSIS — G629 Polyneuropathy, unspecified: Secondary | ICD-10-CM | POA: Diagnosis not present

## 2016-10-01 DIAGNOSIS — Z8673 Personal history of transient ischemic attack (TIA), and cerebral infarction without residual deficits: Secondary | ICD-10-CM | POA: Diagnosis not present

## 2016-10-01 DIAGNOSIS — I480 Paroxysmal atrial fibrillation: Secondary | ICD-10-CM | POA: Diagnosis not present

## 2016-10-01 DIAGNOSIS — I87331 Chronic venous hypertension (idiopathic) with ulcer and inflammation of right lower extremity: Secondary | ICD-10-CM | POA: Diagnosis present

## 2016-10-01 DIAGNOSIS — M1990 Unspecified osteoarthritis, unspecified site: Secondary | ICD-10-CM | POA: Diagnosis not present

## 2016-10-01 DIAGNOSIS — L97311 Non-pressure chronic ulcer of right ankle limited to breakdown of skin: Secondary | ICD-10-CM | POA: Insufficient documentation

## 2016-10-01 DIAGNOSIS — L309 Dermatitis, unspecified: Secondary | ICD-10-CM | POA: Diagnosis not present

## 2016-10-01 DIAGNOSIS — Z7901 Long term (current) use of anticoagulants: Secondary | ICD-10-CM | POA: Insufficient documentation

## 2016-10-02 NOTE — Progress Notes (Signed)
FLAVIA, BRUSS ([id number]) Visit Report for 10/01/2016 Chief Complaint Document Details Patient Name: APPOLONIA, ACKERT Date of Service: 10/01/2016 10:00 AM Medical Record Patient Account Number: [id number] Number: Treating RN: Cornell Barman 07-Feb-1941 (76 y.o. Other Clinician: Date of Birth/Sex: Female) Treating Mylena Sedberry Primary Care Provider: [name] Provider/Extender: G Referring Provider: Leslye Peer in Treatment: 5 Information Obtained from: Patient Chief Complaint 08/27/16; patient is here for review of a chronic wound on the right medial ankle that has been present for one year Electronic Signature(s) Signed: 10/01/2016 5:35:48 PM By: Linton Ham MD Entered By: Linton Ham on 10/01/2016 11:38:12 [name] ([id number]) -------------------------------------------------------------------------------- Debridement Details Patient Name: [name] Date of Service: 10/01/2016 10:00 AM Medical Record Patient Account Number: [id number] Number: Treating RN: Cornell Barman 01-24-1941 (76 y.o. Other Clinician: Date of Birth/Sex: Female) Treating Dierdra Salameh, Brutus Primary Care Provider: Diona Browner, Amy Provider/Extender: G Referring Provider: Leslye Peer in Treatment: 5 Debridement Performed for Wound #1 Right,Medial Ankle Assessment: Performed By: Physician Ricard Dillon, MD Debridement: Open Wound/Selective Debridement Selective Description: Pre-procedure Verification/Time Out Yes - 10:30 Taken: Start Time: 10:31 Pain Control: Other : lidocaine 4% Level: Non-Viable Tissue Total Area Debrided (L x 0.6 (cm) x 0.5 (cm) = 0.3 (cm) W): Tissue and other Non-Viable, Eschar, Other material debrided: Instrument: Curette Bleeding: None End Time: 10:35 Procedural Pain: 3 Post Procedural Pain: 0 Response to Treatment: Procedure was tolerated well Post Debridement Measurements of Total Wound Length: (cm)  0.3 Width: (cm) 0.5 Depth: (cm) 0.1 Volume: (cm) 0.012 Character of Wound/Ulcer Post Requires Further Debridement Debridement: Post Procedure Diagnosis Same as Pre-procedure Electronic Signature(s) Signed: 10/01/2016 3:55:06 PM By: Gretta Cool, BSN, RN, CWS, Kim RN, BSN Signed: 10/01/2016 5:35:48 PM By: Linton Ham MD [name] ([id number]) Entered By: Linton Ham on 10/01/2016 11:39:28 [name] ([id number]) -------------------------------------------------------------------------------- HPI Details Patient Name: [name] Date of Service: 10/01/2016 10:00 AM Medical Record Patient Account Number: [id number] Number: Treating RN: Cornell Barman [birth year] (76 y.o. Other Clinician: Date of Birth/Sex: Female) Treating Benjamyn Hestand Primary Care Provider: [name] Provider/Extender: G Referring Provider: Leslye Peer in Treatment: 5 History of Present Illness HPI Description: 08/27/16; his is a 76 year old woman who has been dealing with a ulcer on her right medial ankle for about a year. She is uncertain about how this actually opened however it is not really changed that much in quite some time. She has a history of bleeding per Deniece Ree these on the left more remotely but no other ulcer history. She is not a diabetic but she does have chronic right leg greater than left leg swelling. Looking through [employer] she has a history of eczema, systolic congestive heart failure left CVA and paroxysmal A. fib on Eliquis. She has been using Neosporin and more recently Bactroban without improvement. She saw her primary physician who referred her here, notes they're suggesting Coban and gauze. Dimensions of the wounds 1.7 x 2 x 0.1. ABIs in this clinic were 1.0 bilaterally. 09/03/16; wound dimensions are smaller. Most of the wound still covered in an adherent necrotic surface. We have been using Santyl under compression. Patient complains of  pain and discomfort 09/10/16; wound dimensions on the right medial malleolus are improved. Condition of the wound bed is improved. We have been using Santyl under compression. She has chronic venous inflammation around the wound which is tender. 09/17/16; 1 x 1.2 x 0.1. Surface of this looks better. No debridement. No evidence of surrounding  infection. For some reason her reflux studies are booked for late July although she appears to be on some sort of cancellation list 09/24/16; the wound is about half the size this week. Still some nonviable tissue around the circumference and some necrotic material over the surface of the wound that were debridement today but overall this looks like it is well on its way to closure. 10/01/16; the wound is down to a very small open area. Arrived today with collagen stuck on the wound surface. It was difficult to remove this Electronic Signature(s) Signed: 10/01/2016 5:35:48 PM By: Linton Ham MD Entered By: Linton Ham on 10/01/2016 11:38:45 [name] ([id number]) -------------------------------------------------------------------------------- Physical Exam Details Patient Name: [name] Date of Service: 10/01/2016 10:00 AM Medical Record Patient Account Number: [id number] Number: Treating RN: Cornell Barman Dec 19, 1940 (76 y.o. Other Clinician: Date of Birth/Sex: Female) Treating Jarrad Mclees Primary Care Provider: [name] Provider/Extender: G Referring Provider: [name] Weeks in Treatment: 5 Constitutional Patient is hypertensive.. Pulse regular and within target range for patient.Marland Kitchen Respirations regular, non-labored and within target range.. Temperature is normal and within the target range for the patient.Marland Kitchen appears in no distress. Cardiovascular Pedal pulses palpable and strong bilaterally.Marland Kitchen Psychiatric No evidence of depression, anxiety, or agitation. Calm, cooperative, and communicative.  Appropriate interactions and affect.. Notes Wound exam; the wound continues to contract. Minimal open area. Using a #3 curet I remove necrotic material which was probably partially adherent collagen, necrotic wound surface. Hemostasis with direct pressure Electronic Signature(s) Signed: 10/01/2016 5:35:48 PM By: Linton Ham MD Entered By: Linton Ham on 10/01/2016 11:41:29 [name] ([id number]) -------------------------------------------------------------------------------- Physician Orders Details Patient Name: [name] Date of Service: 10/01/2016 10:00 AM Medical Record Patient Account Number: [id number] Number: Treating RN: Cornell Barman [birth year] (76 y.o. Other Clinician: Date of Birth/Sex: Female) Treating Lerin Jech Primary Care Provider: [name] Provider/Extender: G Referring Provider: Leslye Peer in Treatment: 5 Verbal / Phone Orders: No Diagnosis Coding Wound Cleansing Wound #1 Right,Medial Ankle o Clean wound with Normal Saline. o May shower with protection. - USE CAST PROTECTOR o No tub bath. Anesthetic Wound #1 Right,Medial Ankle o Topical Lidocaine 4% cream applied to wound bed prior to debridement Primary Wound Dressing Wound #1 Right,Medial Ankle o Prisma Ag - Mepitel underneath so that it doesn't stick to wound Secondary Dressing Wound #1 Right,Medial Ankle o ABD pad Dressing Change Frequency Wound #1 Right,Medial Ankle o Change dressing every week Follow-up Appointments Wound #1 Right,Medial Ankle o Return Appointment in 1 week. Edema Control Wound #1 Right,Medial Ankle o 3 Layer Compression System - Right Lower Extremity o Elevate legs to the level of the heart and pump ankles as often as possible Additional Orders / Instructions Wound #1 Right,Medial Ankle [name] ([id number]) o Increase protein intake. o Activity as tolerated Electronic Signature(s) Signed:  10/01/2016 3:55:06 PM By: Gretta Cool, BSN, RN, CWS, Kim RN, BSN Signed: 10/01/2016 5:35:48 PM By: Linton Ham MD Entered By: Gretta Cool, BSN, RN, CWS, Kim on 10/01/2016 10:59:22 [name] ([id number]) -------------------------------------------------------------------------------- Problem List Details Patient Name: MALAAK, STACH Date of Service: 10/01/2016 10:00 AM Medical Record Patient Account Number: [id number] Number: Treating RN: Cornell Barman 1940-07-31 (76 y.o. Other Clinician: Date of Birth/Sex: Female) Treating Venancio Chenier Primary Care Provider: [name] Provider/Extender: G Referring Provider: Leslye Peer in Treatment: 5 Active Problems ICD-10 Encounter Code Description Active Date Diagnosis L97.311 Non-pressure chronic ulcer of right ankle limited to 08/27/2016 Yes breakdown of skin  I87.331 Chronic venous hypertension (idiopathic) with ulcer and 08/27/2016 Yes inflammation of right lower extremity Inactive Problems Resolved Problems Electronic Signature(s) Signed: 10/01/2016 5:35:48 PM By: Linton Ham MD Entered By: Linton Ham on 10/01/2016 11:37:16 [name] ([id number]) -------------------------------------------------------------------------------- Progress Note Details Patient Name: [name] Date of Service: 10/01/2016 10:00 AM Medical Record Patient Account Number: [id number] Number: Treating RN: Cornell Barman January 28, 1941 (76 y.o. Other Clinician: Date of Birth/Sex: Female) Treating Ceniyah Thorp Primary Care Provider: [name] Provider/Extender: G Referring Provider: Leslye Peer in Treatment: 5 Subjective Chief Complaint Information obtained from Patient 08/27/16; patient is here for review of a chronic wound on the right medial ankle that has been present for one year History of Present Illness (HPI) 08/27/16; his is a 76 year old woman who has been dealing with a ulcer on her right  medial ankle for about a year. She is uncertain about how this actually opened however it is not really changed that much in quite some time. She has a history of bleeding per Deniece Ree these on the left more remotely but no other ulcer history. She is not a diabetic but she does have chronic right leg greater than left leg swelling. Looking through [employer] she has a history of eczema, systolic congestive heart failure left CVA and paroxysmal A. fib on Eliquis. She has been using Neosporin and more recently Bactroban without improvement. She saw her primary physician who referred her here, notes they're suggesting Coban and gauze. Dimensions of the wounds 1.7 x 2 x 0.1. ABIs in this clinic were 1.0 bilaterally. 09/03/16; wound dimensions are smaller. Most of the wound still covered in an adherent necrotic surface. We have been using Santyl under compression. Patient complains of pain and discomfort 09/10/16; wound dimensions on the right medial malleolus are improved. Condition of the wound bed is improved. We have been using Santyl under compression. She has chronic venous inflammation around the wound which is tender. 09/17/16; 1 x 1.2 x 0.1. Surface of this looks better. No debridement. No evidence of surrounding infection. For some reason her reflux studies are booked for late July although she appears to be on some sort of cancellation list 09/24/16; the wound is about half the size this week. Still some nonviable tissue around the circumference and some necrotic material over the surface of the wound that were debridement today but overall this looks like it is well on its way to closure. 10/01/16; the wound is down to a very small open area. Arrived today with collagen stuck on the wound surface. It was difficult to remove this SHANOAH, ASBILL ([id number]) Objective Constitutional Patient is hypertensive.. Pulse regular and within target range for patient.Marland Kitchen Respirations regular,  non-labored and within target range.. Temperature is normal and within the target range for the patient.Marland Kitchen appears in no distress. Vitals Time Taken: 10:14 AM, Height: 65 in, Weight: 132 lbs, BMI: 22, Temperature: 97.5 F, Pulse: 56 bpm, Respiratory Rate: 16 breaths/min, Blood Pressure: 159/62 mmHg. Cardiovascular Pedal pulses palpable and strong bilaterally.Marland Kitchen Psychiatric No evidence of depression, anxiety, or agitation. Calm, cooperative, and communicative. Appropriate interactions and affect.. General Notes: Wound exam; the wound continues to contract. Minimal open area. Using a #3 curet I remove necrotic material which was probably partially adherent collagen, necrotic wound surface. Hemostasis with direct pressure Integumentary (Hair, Skin) Wound #1 status is Open. Original cause of wound was Gradually Appeared. The wound is located on the Right,Medial Ankle. The wound measures 0.3cm length x 0.5cm width x  0.1cm depth; 0.118cm^2 area and 0.012cm^3 volume. There is Fat Layer (Subcutaneous Tissue) Exposed exposed. There is no tunneling or undermining noted. There is a medium amount of serosanguineous drainage noted. The wound margin is distinct with the outline attached to the wound base. There is large (67-100%) pink granulation within the wound bed. There is a small (1-33%) amount of necrotic tissue within the wound bed including Adherent Slough. The periwound skin appearance exhibited: Dry/Scaly, Ecchymosis, Hemosiderin Staining, Mottled. The periwound skin appearance did not exhibit: Callus, Crepitus, Excoriation, Induration, Rash, Scarring, Maceration, Atrophie Blanche, Cyanosis, Pallor, Rubor, Erythema. Periwound temperature was noted as No Abnormality. The periwound has tenderness on palpation. Assessment Active Problems ICD-10 X21.194 - Non-pressure chronic ulcer of right ankle limited to breakdown of skin I87.331 - Chronic venous hypertension (idiopathic) with ulcer and  inflammation of right lower extremity INIKA, BELLANGER ([id number]) Procedures Wound #1 Pre-procedure diagnosis of Wound #1 is a Vasculopathy located on the Right,Medial Ankle . There was a Non-Viable Tissue Open Wound/Selective [phone]) debridement with total area of 0.3 sq cm performed by Ricard Dillon, MD. with the following instrument(s): Curette to remove Non-Viable tissue/material including Eschar and Other after achieving pain control using Other (lidocaine 4%). A time out was conducted at 10:30, prior to the start of the procedure. There was no bleeding. The procedure was tolerated well with a pain level of 3 throughout and a pain level of 0 following the procedure. Post Debridement Measurements: 0.3cm length x 0.5cm width x 0.1cm depth; 0.012cm^3 volume. Character of Wound/Ulcer Post Debridement requires further debridement. Post procedure Diagnosis Wound #1: Same as Pre-Procedure Plan Wound Cleansing: Wound #1 Right,Medial Ankle: Clean wound with Normal Saline. May shower with protection. - USE CAST PROTECTOR No tub bath. Anesthetic: Wound #1 Right,Medial Ankle: Topical Lidocaine 4% cream applied to wound bed prior to debridement Primary Wound Dressing: Wound #1 Right,Medial Ankle: Prisma Ag - Mepitel underneath so that it doesn't stick to wound Secondary Dressing: Wound #1 Right,Medial Ankle: ABD pad Dressing Change Frequency: Wound #1 Right,Medial Ankle: Change dressing every week Follow-up Appointments: Wound #1 Right,Medial Ankle: Return Appointment in 1 week. Edema Control: Wound #1 Right,Medial Ankle: 3 Layer Compression System - Right Lower Extremity Elevate legs to the level of the heart and pump ankles as often as possible Additional Orders / Instructions: Wound #1 Right,Medial Ankle: Increase protein intake. [name] ([id number]) Activity as tolerated #1 will continue with silver collagen with Mepitel to avoid sticking. This should  be healed next week Electronic Signature(s) Signed: 10/01/2016 5:35:48 PM By: Linton Ham MD Entered By: Linton Ham on 10/01/2016 11:42:12 [name] ([id number]) -------------------------------------------------------------------------------- SuperBill Details Patient Name: [name] Date of Service: 10/01/2016 Medical Record Patient Account Number: [id number] Number: Treating RN: Cornell Barman 28-Mar-1941 (76 y.o. Other Clinician: Date of Birth/Sex: Female) Treating Batsheva Stevick Primary Care Provider: [name] Provider/Extender: G Referring Provider: [name] Weeks in Treatment: 5 Diagnosis Coding ICD-10 Codes Code Description J62.836 Non-pressure chronic ulcer of right ankle limited to breakdown of skin Chronic venous hypertension (idiopathic) with ulcer and inflammation of right lower I87.331 extremity Facility Procedures CPT4 Code Description: [id number] - DEBRIDE WOUND 1ST 20 SQ CM OR < ICD-10 Description Diagnosis L97.311 Non-pressure chronic ulcer of right ankle limited to Modifier: breakdown of Quantity: 1 skin Physician Procedures CPT4 Code Description: [id number] - WC PHYS DEBR WO ANESTH 20 SQ CM ICD-10 Description Diagnosis L97.311 Non-pressure chronic ulcer of right ankle limited to Modifier: breakdown of Quantity: 1  skin Electronic Signature(s) Signed: 10/01/2016 5:35:48 PM By: Linton Ham MD Entered By: Linton Ham on 10/01/2016 11:42:35

## 2016-10-02 NOTE — Progress Notes (Signed)
MAHAGONY, GRIEB ([id number]) Visit Report for 10/01/2016 Arrival Information Details Patient Name: MEDHA, PIPPEN Date of Service: 10/01/2016 10:00 AM Medical Record Number: [id number] Patient Account Number: [id number] Date of Birth/Sex: 12/29/1940 (76 y.o. Female) Treating RN: Cornell Barman Primary Care Jakory Matsuo: [name] Other Clinician: Referring Jace Dowe: [name] Treating Calder Oblinger/Extender: Tito Dine in Treatment: 5 Visit Information History Since Last Visit Added or deleted any medications: No Patient Arrived: Kasandra Knudsen Any new allergies or adverse reactions: No Arrival Time: 10:14 Had a fall or experienced change in No Accompanied By: self activities of daily living that may affect Transfer Assistance: None risk of falls: Patient Identification Verified: Yes Signs or symptoms of abuse/neglect since last No Secondary Verification Process Completed: Yes visito Patient Requires Transmission-Based No Hospitalized since last visit: No Precautions: Has Dressing in Place as Prescribed: Yes Patient Has Alerts: No Has Compression in Place as Prescribed: Yes Pain Present Now: No Electronic Signature(s) Signed: 10/01/2016 3:55:06 PM By: Gretta Cool, BSN, RN, CWS, Kim RN, BSN Entered By: Gretta Cool, BSN, RN, CWS, Kim on 10/01/2016 10:14:30 [name] ([id number]) -------------------------------------------------------------------------------- Encounter Discharge Information Details Patient Name: [name] Date of Service: 10/01/2016 10:00 AM Medical Record Number: [id number] Patient Account Number: [id number] Date of Birth/Sex: 03-31-1941 (77 y.o. Female) Treating RN: Cornell Barman Primary Care Helane Briceno: [name] Other Clinician: Referring Kendria Halberg: [name] Treating Ranson Belluomini/Extender: Tito Dine in Treatment: 5 Encounter Discharge Information Items Discharge Pain Level: 0 Discharge Condition: Stable Ambulatory Status: Cane Discharge  Destination: Home Private Transportation: Auto Accompanied By: self Schedule Follow-up Appointment: Yes Medication Reconciliation completed and Yes provided to Patient/Care Artur Winningham: Clinical Summary of Care: Electronic Signature(s) Signed: 10/01/2016 3:55:06 PM By: Gretta Cool, BSN, RN, CWS, Kim RN, BSN Previous Signature: 10/01/2016 11:04:55 AM Version By: Ruthine Dose Entered By: Gretta Cool BSN, RN, CWS, Kim on 10/01/2016 11:05:17 [name] ([id number]) -------------------------------------------------------------------------------- Lower Extremity Assessment Details Patient Name: [name] Date of Service: 10/01/2016 10:00 AM Medical Record Number: [id number] Patient Account Number: [id number] Date of Birth/Sex: 1940/07/13 (76 y.o. Female) Treating RN: Cornell Barman Primary Care Sheryll Dymek: [name] Other Clinician: Referring Marsela Kuan: [name] Treating Micheline Markes/Extender: Ricard Dillon Weeks in Treatment: 5 Edema Assessment Assessed: [Left: No] [Right: No] E[Left: dema] [Right: :] Calf Left: Right: Point of Measurement: 35 cm From Medial Instep cm 32.2 cm Ankle Left: Right: Point of Measurement: 9 cm From Medial Instep cm 21.5 cm Vascular Assessment Claudication: Claudication Assessment [Right:Rest Pain] Pulses: Dorsalis Pedis Palpable: [Right:Yes] Posterior Tibial Extremity colors, hair growth, and conditions: Extremity Color: [Right:Normal] Hair Growth on Extremity: [Right:Yes] Temperature of Extremity: [Right:Warm] Capillary Refill: [Right:> 3 seconds] Dependent Rubor: [Right:No] Blanched when Elevated: [Right:No] Lipodermatosclerosis: [Right:No] Toe Nail Assessment Left: Right: Thick: No Discolored: No Deformed: No Improper Length and Hygiene: No NYLEE, BARBUTO ([id number]) Electronic Signature(s) Signed: 10/01/2016 3:55:06 PM By: Gretta Cool, BSN, RN, CWS, Kim RN, BSN Entered By: Gretta Cool, BSN, RN, CWS, Kim on 10/01/2016 10:24:10 [name] ([id number]) -------------------------------------------------------------------------------- Multi Wound Chart Details Patient Name: [name] Date of Service: 10/01/2016 10:00 AM Medical Record Number: [id number] Patient Account Number: [id number] Date of Birth/Sex: Jan 24, 1941 (76 y.o. Female) Treating RN: Cornell Barman Primary Care Zylan Almquist: [name] Other Clinician: Referring Tran Arzuaga: [name] Treating Birdell Frasier/Extender: Ricard Dillon Weeks in Treatment: 5 Vital Signs Height(in): 65 Pulse(bpm): 56 Weight(lbs): 132 Blood Pressure 159/62 (mmHg): Body Mass Index(BMI): 22 Temperature(F): 97.5 Respiratory Rate 16 (breaths/min): Photos: [N/A:N/A] Wound Location: Right Ankle - Medial N/A N/A Wounding Event: Gradually  Appeared N/A N/A Primary Etiology: Vasculopathy N/A N/A Comorbid History: Arrhythmia, Coronary N/A N/A Artery Disease, Peripheral Arterial Disease, Peripheral Venous Disease, Osteoarthritis, Neuropathy Date Acquired: 09/30/2015 N/A N/A Weeks of Treatment: 5 N/A N/A Wound Status: Open N/A N/A Measurements L x W x D 0.3x0.5x0.1 N/A N/A (cm) Area (cm) : 0.118 N/A N/A Volume (cm) : 0.012 N/A N/A % Reduction in Area: 95.60% N/A N/A % Reduction in Volume: 95.50% N/A N/A Classification: Full Thickness Without N/A N/A Exposed Support Structures Exudate Amount: Medium N/A N/A Exudate Type: Serosanguineous N/A N/A Exudate Color: red, brown N/A N/A Wound Margin: Distinct, outline attached N/A N/A [name] ([id number]) Granulation Amount: Large (67-100%) N/A N/A Granulation Quality: Pink N/A N/A Necrotic Amount: Small (1-33%) N/A N/A Exposed Structures: Fat Layer (Subcutaneous N/A N/A Tissue) Exposed: Yes Fascia: No Tendon: No Muscle: No Joint: No Bone: No Epithelialization: None N/A N/A Debridement: Open Wound/Selective N/A N/A ([id number]) - Selective Pre-procedure 10:30 N/A N/A Verification/Time Out Taken: Pain  Control: Other N/A N/A Tissue Debrided: Necrotic/Eschar, Other N/A N/A Level: Non-Viable Tissue N/A N/A Debridement Area (sq 0.3 N/A N/A cm): Instrument: Curette N/A N/A Bleeding: None N/A N/A Procedural Pain: 3 N/A N/A Post Procedural Pain: 0 N/A N/A Debridement Treatment Procedure was tolerated N/A N/A Response: well Post Debridement 0.3x0.5x0.1 N/A N/A Measurements L x W x D (cm) Post Debridement 0.012 N/A N/A Volume: (cm) Periwound Skin Texture: Excoriation: No N/A N/A Induration: No Callus: No Crepitus: No Rash: No Scarring: No Periwound Skin Dry/Scaly: Yes N/A N/A Moisture: Maceration: No Periwound Skin Color: Ecchymosis: Yes N/A N/A Hemosiderin Staining: Yes Mottled: Yes Atrophie Blanche: No Cyanosis: No Erythema: No Pallor: No Rubor: No Temperature: No Abnormality N/A N/A Yes N/A N/A SHARNETTE, KITAMURA ([id number]) Tenderness on Palpation: Wound Preparation: Ulcer Cleansing: N/A N/A Rinsed/Irrigated with Saline, Other: soap and water Topical Anesthetic Applied: Other: lidocaine 4% Procedures Performed: Debridement N/A N/A Treatment Notes Wound #1 (Right, Medial Ankle) 1. Cleansed with: Clean wound with Normal Saline 2. Anesthetic Topical Lidocaine 4% cream to wound bed prior to debridement 4. Dressing Applied: Prisma Ag Mepitel 5. Secondary Dressing Applied ABD Pad 7. Secured with 3 Layer Compression System - Right Lower Extremity Electronic Signature(s) Signed: 10/01/2016 5:35:48 PM By: Linton Ham MD Entered By: Linton Ham on 10/01/2016 11:37:45 [name] ([id number]) -------------------------------------------------------------------------------- [place] Details Patient Name: [name] Date of Service: 10/01/2016 10:00 AM Medical Record Number: [id number] Patient Account Number: [id number] Date of Birth/Sex: 28-Sep-1940 (76 y.o. Female) Treating RN: Cornell Barman Primary Care Abu Heavin: [name] Other Clinician: Referring Roben Schliep: [name] Treating Willetta York/Extender: Tito Dine in Treatment: 5 Active Inactive ` Orientation to the Wound Care Program Nursing Diagnoses: Knowledge deficit related to the wound healing center program Goals: Patient/caregiver will verbalize understanding of the Power Program Date Initiated: 08/27/2016 Target Resolution Date: 01/27/2017 Goal Status: Active Interventions: Provide education on orientation to the wound center Notes: ` Venous Leg Ulcer Nursing Diagnoses: Knowledge deficit related to disease process and management Potential for venous Insuffiency (use before diagnosis confirmed) Goals: Non-invasive venous studies are completed as ordered Date Initiated: 08/27/2016 Target Resolution Date: 01/27/2017 Goal Status: Active Patient will maintain optimal edema control Date Initiated: 08/27/2016 Target Resolution Date: 01/27/2017 Goal Status: Active Patient/caregiver will verbalize understanding of disease process and disease management Date Initiated: 08/27/2016 Target Resolution Date: 01/27/2017 Goal Status: Active Verify adequate tissue perfusion prior to therapeutic compression application Date Initiated: 08/27/2016 Target Resolution Date: 01/27/2017 Goal Status: Active  KYNZLEIGH, BANDEL ([id number]) Interventions: Assess peripheral edema status every visit. Compression as ordered Provide education on venous insufficiency Treatment Activities: Non-invasive vascular studies : 08/27/2016 Test ordered outside of clinic : 08/27/2016 Therapeutic compression applied : 08/27/2016 Venous Duplex Doppler : 08/27/2016 Notes: ` Wound/Skin Impairment Nursing Diagnoses: Impaired tissue integrity Knowledge deficit related to ulceration/compromised skin integrity Goals: Patient/caregiver will verbalize understanding of skin care regimen Date Initiated: 08/27/2016 Target Resolution Date: 01/27/2017 Goal  Status: Active Ulcer/skin breakdown will have a volume reduction of 30% by week 4 Date Initiated: 08/27/2016 Target Resolution Date: 01/27/2017 Goal Status: Active Ulcer/skin breakdown will have a volume reduction of 50% by week 8 Date Initiated: 08/27/2016 Target Resolution Date: 01/27/2017 Goal Status: Active Ulcer/skin breakdown will have a volume reduction of 80% by week 12 Date Initiated: 08/27/2016 Target Resolution Date: 01/27/2017 Goal Status: Active Ulcer/skin breakdown will heal within 14 weeks Date Initiated: 08/27/2016 Target Resolution Date: 01/27/2017 Goal Status: Active Interventions: Assess patient/caregiver ability to obtain necessary supplies Assess patient/caregiver ability to perform ulcer/skin care regimen upon admission and as needed Assess ulceration(s) every visit Provide education on ulcer and skin care Treatment Activities: Skin care regimen initiated : 08/27/2016 [name] ([id number]) Topical wound management initiated : 08/27/2016 Notes: Electronic Signature(s) Signed: 10/01/2016 3:55:06 PM By: Gretta Cool, BSN, RN, CWS, Kim RN, BSN Entered By: Gretta Cool, BSN, RN, CWS, Kim on 10/01/2016 10:42:15 [name] ([id number]) -------------------------------------------------------------------------------- Pain Assessment Details Patient Name: [name] Date of Service: 10/01/2016 10:00 AM Medical Record Number: [id number] Patient Account Number: [id number] Date of Birth/Sex: 05-Oct-1940 (76 y.o. Female) Treating RN: Cornell Barman Primary Care Torren Maffeo: [name] Other Clinician: Referring Laqueena Hinchey: [name] Treating Dewayne Jurek/Extender: Tito Dine in Treatment: 5 Active Problems Location of Pain Severity and Description of Pain Patient Has Paino No Site Locations With Dressing Change: No Pain Management and Medication Current Pain Management: Goals for Pain Management Topical or injectable lidocaine is offered to patient for  acute pain when surgical debridement is performed. If needed, Patient is instructed to use over the counter pain medication for the following 24-48 hours after debridement. Wound care MDs do not prescribed pain medications. Patient has chronic pain or uncontrolled pain. Patient has been instructed to make an appointment with their Primary Care Physician for pain management. Electronic Signature(s) Signed: 10/01/2016 3:55:06 PM By: Gretta Cool, BSN, RN, CWS, Kim RN, BSN Entered By: Gretta Cool, BSN, RN, CWS, Kim on 10/01/2016 10:14:56 [name] ([id number]) -------------------------------------------------------------------------------- Patient/Caregiver Education Details Patient Name: [name] Date of Service: 10/01/2016 10:00 AM Medical Record Patient Account Number: [id number] Number: Treating RN: Cornell Barman Jan 01, 1941 (76 y.o. Other Clinician: Date of Birth/Gender: Female) Treating ROBSON, MICHAEL Primary Care Physician: [name] Physician/Extender: G Referring Physician: Leslye Peer in Treatment: 5 Education Assessment Education Provided To: Patient Education Topics Provided Venous: Handouts: Managing Venous Disease and Related Ulcers Methods: Demonstration Responses: State content correctly Wound/Skin Impairment: Handouts: Caring for Your Ulcer Methods: Demonstration Responses: State content correctly Electronic Signature(s) Signed: 10/01/2016 3:55:06 PM By: Gretta Cool, BSN, RN, CWS, Kim RN, BSN Entered By: Gretta Cool, BSN, RN, CWS, Kim on 10/01/2016 11:06:10 [name] ([id number]) -------------------------------------------------------------------------------- Wound Assessment Details Patient Name: [name] Date of Service: 10/01/2016 10:00 AM Medical Record Number: [id number] Patient Account Number: [id number] Date of Birth/Sex: 16-Sep-1940 (77 y.o. Female) Treating RN: Cornell Barman Primary Care Avyaan Summer: [name] Other  Clinician: Referring Denissa Cozart: [name] Treating Arrianna Catala/Extender: Ricard Dillon Weeks in Treatment: 5 Wound Status Wound Number: 1 Primary  Vasculopathy Etiology: Wound Location: Right Ankle - Medial Wound Open Wounding Event: Gradually Appeared Status: Date Acquired: 09/30/2015 Comorbid Arrhythmia, Coronary Artery Disease, Weeks Of Treatment: 5 History: Peripheral Arterial Disease, Peripheral Clustered Wound: No Venous Disease, Osteoarthritis, Neuropathy Photos Wound Measurements Length: (cm) 0.3 Width: (cm) 0.5 Depth: (cm) 0.1 Area: (cm) 0.118 Volume: (cm) 0.012 % Reduction in Area: 95.6% % Reduction in Volume: 95.5% Epithelialization: None Tunneling: No Undermining: No Wound Description Full Thickness Without Exposed Classification: Support Structures Wound Margin: Distinct, outline attached Exudate Medium Amount: Exudate Type: Serosanguineous Exudate Color: red, brown Foul Odor After Cleansing: No Slough/Fibrino Yes Wound Bed Granulation Amount: Large (67-100%) Exposed Structure Granulation Quality: Pink Fascia Exposed: No Necrotic Amount: Small (1-33%) Fat Layer (Subcutaneous Tissue) Exposed: Yes Necrotic Quality: Adherent Slough Tendon Exposed: No KHAMIA, STAMBAUGH ([id number]) Muscle Exposed: No Joint Exposed: No Bone Exposed: No Periwound Skin Texture Texture Color No Abnormalities Noted: No No Abnormalities Noted: No Callus: No Atrophie Blanche: No Crepitus: No Cyanosis: No Excoriation: No Ecchymosis: Yes Induration: No Erythema: No Rash: No Hemosiderin Staining: Yes Scarring: No Mottled: Yes Pallor: No Moisture Rubor: No No Abnormalities Noted: No Dry / Scaly: Yes Temperature / Pain Maceration: No Temperature: No Abnormality Tenderness on Palpation: Yes Wound Preparation Ulcer Cleansing: Rinsed/Irrigated with Saline, Other: soap and water, Topical Anesthetic Applied: Other: lidocaine 4%, Treatment Notes Wound #1  (Right, Medial Ankle) 1. Cleansed with: Clean wound with Normal Saline 2. Anesthetic Topical Lidocaine 4% cream to wound bed prior to debridement 4. Dressing Applied: Prisma Ag Mepitel 5. Secondary Dressing Applied ABD Pad 7. Secured with 3 Layer Compression System - Right Lower Extremity Electronic Signature(s) Signed: 10/01/2016 3:55:06 PM By: Gretta Cool, BSN, RN, CWS, Kim RN, BSN Entered By: Gretta Cool, BSN, RN, CWS, Kim on 10/01/2016 10:44:47 [name] ([id number]) -------------------------------------------------------------------------------- Vitals Details Patient Name: [name] Date of Service: 10/01/2016 10:00 AM Medical Record Number: [id number] Patient Account Number: [id number] Date of Birth/Sex: [birth year] (76 y.o. Female) Treating RN: Cornell Barman Primary Care Kingsly Kloepfer: [name] Other Clinician: Referring Cami Delawder: [name] Treating [place] Kinyon/Extender: Tito Dine in Treatment: 5 Vital Signs Time Taken: 10:14 Temperature (F): 97.5 Height (in): 65 Pulse (bpm): 56 Weight (lbs): 132 Respiratory Rate (breaths/min): 16 Body Mass Index (BMI): 22 Blood Pressure (mmHg): 159/62 Reference Range: 80 - 120 mg / dl Electronic Signature(s) Signed: 10/01/2016 3:55:06 PM By: Gretta Cool, BSN, RN, CWS, Kim RN, BSN Entered By: Gretta Cool, BSN, RN, CWS, Kim on 10/01/2016 10:15:12

## 2016-10-08 ENCOUNTER — Encounter: Payer: Medicare PPO | Admitting: Physician Assistant

## 2016-10-08 DIAGNOSIS — I87331 Chronic venous hypertension (idiopathic) with ulcer and inflammation of right lower extremity: Secondary | ICD-10-CM | POA: Diagnosis not present

## 2016-10-09 NOTE — Progress Notes (Signed)
Karen Bowman, Karen Bowman ([id number]) Visit Report for 10/08/2016 Arrival Information Details Patient Name: Karen Bowman, Karen Bowman Date of Service: 10/08/2016 11:00 AM Medical Record Number: [id number] Patient Account Number: [id number] Date of Birth/Sex: Sep 29, 1940 (76 y.o. Female) Treating RN: Cornell Barman Primary Care Lulabelle Desta: [name] Other Clinician: Referring Glendal Cassaday: [name] Treating Kavita Bartl/Extender: Melburn Hake, HOYT Weeks in Treatment: 6 Visit Information History Since Last Visit Added or deleted any medications: No Patient Arrived: Kasandra Knudsen Any new allergies or adverse reactions: No Arrival Time: 11:11 Had a fall or experienced change in No Accompanied By: self activities of daily living that may affect Transfer Assistance: None risk of falls: Patient Identification Verified: Yes Signs or symptoms of abuse/neglect since last No Secondary Verification Process Completed: Yes visito Patient Requires Transmission-Based No Hospitalized since last visit: No Precautions: Has Dressing in Place as Prescribed: Yes Patient Has Alerts: No Has Compression in Place as Prescribed: Yes Pain Present Now: No Electronic Signature(s) Signed: 10/08/2016 5:36:29 PM By: Gretta Cool, BSN, RN, CWS, Kim RN, BSN Entered By: Gretta Cool, BSN, RN, CWS, Kim on 10/08/2016 11:12:06 Karen Bowman ([id number]) -------------------------------------------------------------------------------- Clinic Level of Care Assessment Details Patient Name: Karen Bowman Date of Service: 10/08/2016 11:00 AM Medical Record Number: [id number] Patient Account Number: [id number] Date of Birth/Sex: [birth year] (76 y.o. Female) Treating RN: Cornell Barman Primary Care Waylon Hershey: [name] Other Clinician: Referring Havyn Ramo: [name] Treating Dezmen Alcock/Extender: Melburn Hake, HOYT Weeks in Treatment: 6 Clinic Level of Care Assessment Items TOOL 4 Quantity Score []  - Use when only an EandM is performed on FOLLOW-UP visit  0 ASSESSMENTS - Nursing Assessment / Reassessment []  - Reassessment of Co-morbidities (includes updates in patient status) 0 X - Reassessment of Adherence to Treatment Plan 1 5 ASSESSMENTS - Wound and Skin Assessment / Reassessment X - Simple Wound Assessment / Reassessment - one wound 1 5 []  - Complex Wound Assessment / Reassessment - multiple wounds 0 []  - Dermatologic / Skin Assessment (not related to wound area) 0 ASSESSMENTS - Focused Assessment []  - Circumferential Edema Measurements - multi extremities 0 []  - Nutritional Assessment / Counseling / Intervention 0 []  - Lower Extremity Assessment (monofilament, tuning fork, pulses) 0 []  - Peripheral Arterial Disease Assessment (using hand held doppler) 0 ASSESSMENTS - Ostomy and/or Continence Assessment and Care []  - Incontinence Assessment and Management 0 []  - Ostomy Care Assessment and Management (repouching, etc.) 0 PROCESS - Coordination of Care X - Simple Patient / Family Education for ongoing care 1 15 []  - Complex (extensive) Patient / Family Education for ongoing care 0 []  - Staff obtains Programmer, systems, Records, Test Results / Process Orders 0 []  - Staff telephones HHA, Nursing Homes / Clarify orders / etc 0 []  - Routine Transfer to another Facility (non-emergent condition) 0 Karen Bowman, Karen Bowman ([id number]) []  - Routine Hospital Admission (non-emergent condition) 0 []  - New Admissions / Biomedical engineer / Ordering NPWT, Apligraf, etc. 0 []  - Emergency Hospital Admission (emergent condition) 0 X - Simple Discharge Coordination 1 10 []  - Complex (extensive) Discharge Coordination 0 PROCESS - Special Needs []  - Pediatric / Minor Patient Management 0 []  - Isolation Patient Management 0 []  - Hearing / Language / Visual special needs 0 []  - Assessment of Community assistance (transportation, D/C planning, etc.) 0 []  - Additional assistance / Altered mentation 0 []  - Support Surface(s) Assessment (bed, cushion, seat, etc.)  0 INTERVENTIONS - Wound Cleansing / Measurement X - Simple Wound Cleansing - one wound 1 5 []  - Complex Wound Cleansing - multiple wounds  0 X - Wound Imaging (photographs - any number of wounds) 1 5 []  - Wound Tracing (instead of photographs) 0 X - Simple Wound Measurement - one wound 1 5 []  - Complex Wound Measurement - multiple wounds 0 INTERVENTIONS - Wound Dressings X - Small Wound Dressing one or multiple wounds 1 10 []  - Medium Wound Dressing one or multiple wounds 0 []  - Large Wound Dressing one or multiple wounds 0 []  - Application of Medications - topical 0 []  - Application of Medications - injection 0 INTERVENTIONS - Miscellaneous []  - External ear exam 0 Karen Bowman, Karen Bowman ([id number]) []  - Specimen Collection (cultures, biopsies, blood, body fluids, etc.) 0 []  - Specimen(s) / Culture(s) sent or taken to Lab for analysis 0 []  - Patient Transfer (multiple staff / Civil Service fast streamer / Similar devices) 0 []  - Simple Staple / Suture removal (25 or less) 0 []  - Complex Staple / Suture removal (26 or more) 0 []  - Hypo / Hyperglycemic Management (close monitor of Blood Glucose) 0 []  - Ankle / Brachial Index (ABI) - do not check if billed separately 0 X - Vital Signs 1 5 Has the patient been seen at the hospital within the last three years: Yes Total Score: 65 Level Of Care: New/Established - Level 2 Electronic Signature(s) Signed: 10/08/2016 5:36:29 PM By: Gretta Cool, BSN, RN, CWS, Kim RN, BSN Entered By: Gretta Cool, BSN, RN, CWS, Kim on 10/08/2016 11:47:47 Karen Bowman ([id number]) -------------------------------------------------------------------------------- Encounter Discharge Information Details Patient Name: Karen Bowman Date of Service: 10/08/2016 11:00 AM Medical Record Number: [id number] Patient Account Number: [id number] Date of Birth/Sex: 21-Jun-1940 (76 y.o. Female) Treating RN: Cornell Barman Primary Care Isadore Bokhari: [name] Other Clinician: Referring Allesandra Huebsch: [name] Treating Loveah Like/Extender: Melburn Hake, HOYT Weeks in Treatment: 6 Encounter Discharge Information Items Discharge Pain Level: 0 Discharge Condition: Stable Ambulatory Status: Ambulatory Discharge Destination: Home Transportation: Private Auto Accompanied By: self Schedule Follow-up Appointment: Yes Medication Reconciliation completed and provided to Patient/Care Yes Antwione Picotte: Provided on Clinical Summary of Care: 10/08/2016 Form Type Recipient Paper Patient LE Electronic Signature(s) Signed: 10/08/2016 11:51:00 AM By: Ruthine Dose Entered By: Ruthine Dose on 10/08/2016 11:50:59 Karen Bowman ([id number]) -------------------------------------------------------------------------------- Lower Extremity Assessment Details Patient Name: Karen Bowman Date of Service: 10/08/2016 11:00 AM Medical Record Number: [id number] Patient Account Number: [id number] Date of Birth/Sex: [birth year] (76 y.o. Female) Treating RN: Cornell Barman Primary Care Wilburt Messina: [name] Other Clinician: Referring Chrissie Dacquisto: [name] Treating Ludean Duhart/Extender: Melburn Hake, HOYT Weeks in Treatment: 6 Edema Assessment Assessed: [Left: No] [Right: No] E[Left: dema] [Right: :] Calf Left: Right: Point of Measurement: 35 cm From Medial Instep cm 31.8 cm Ankle Left: Right: Point of Measurement: 9 cm From Medial Instep cm 21.5 cm Vascular Assessment Pulses: Dorsalis Pedis Palpable: [Right:Yes] Posterior Tibial Palpable: [Right:Yes] Extremity colors, hair growth, and conditions: Extremity Color: [Right:Normal] Hair Growth on Extremity: [Right:Yes] Temperature of Extremity: [Right:Warm] Capillary Refill: [Right:< 3 seconds] Toe Nail Assessment Left: Right: Thick: No Discolored: No Deformed: No Electronic Signature(s) Signed: 10/08/2016 5:36:29 PM By: Gretta Cool, BSN, RN, CWS, Kim RN, BSN Entered By: Gretta Cool, BSN, RN, CWS, Kim on 10/08/2016 11:22:55 Karen Bowman  ([id number]) -------------------------------------------------------------------------------- Multi Wound Chart Details Patient Name: Karen Bowman Date of Service: 10/08/2016 11:00 AM Medical Record Number: [id number] Patient Account Number: [id number] Date of Birth/Sex: 1941/03/11 (76 y.o. Female) Treating RN: Cornell Barman Primary Care Christinia Lambeth: [name] Other Clinician: Referring Loetta Connelley: [name] Treating Cambell Stanek/Extender: Melburn Hake, HOYT Weeks in Treatment: 6 Vital Signs Height(in): 65  Pulse(bpm): 50 Weight(lbs): 132 Blood Pressure 178/60 (mmHg): Body Mass Index(BMI): 22 Temperature(F): 97.8 Respiratory Rate 16 (breaths/min): Photos: [N/A:N/A] Wound Location: Right Ankle - Medial N/A N/A Wounding Event: Gradually Appeared N/A N/A Primary Etiology: Vasculopathy N/A N/A Comorbid History: Arrhythmia, Coronary N/A N/A Artery Disease, Peripheral Arterial Disease, Peripheral Venous Disease, Osteoarthritis, Neuropathy Date Acquired: 09/30/2015 N/A N/A Weeks of Treatment: 6 N/A N/A Wound Status: Healed - Epithelialized N/A N/A Measurements L x W x D 0x0x0 N/A N/A (cm) Area (cm) : 0 N/A N/A Volume (cm) : 0 N/A N/A % Reduction in Area: 100.00% N/A N/A % Reduction in Volume: 100.00% N/A N/A Classification: Full Thickness Without N/A N/A Exposed Support Structures Exudate Amount: None Present N/A N/A Wound Margin: Distinct, outline attached N/A N/A Granulation Amount: None Present (0%) N/A N/A Necrotic Amount: None Present (0%) N/A N/A Karen Bowman, Karen Bowman ([id number]) Exposed Structures: Fascia: No N/A N/A Fat Layer (Subcutaneous Tissue) Exposed: No Tendon: No Muscle: No Joint: No Bone: No Epithelialization: Large (67-100%) N/A N/A Periwound Skin Texture: Scarring: Yes N/A N/A Excoriation: No Induration: No Callus: No Crepitus: No Rash: No Periwound Skin Maceration: No N/A N/A Moisture: Dry/Scaly: No Periwound Skin Color: Hemosiderin Staining:  Yes N/A N/A Atrophie Blanche: No Cyanosis: No Ecchymosis: No Erythema: No Mottled: No Pallor: No Rubor: No Temperature: No Abnormality N/A N/A Tenderness on Yes N/A N/A Palpation: Wound Preparation: Ulcer Cleansing: N/A N/A Rinsed/Irrigated with Saline, Other: soap and water Topical Anesthetic Applied: None Treatment Notes Electronic Signature(s) Signed: 10/08/2016 5:36:29 PM By: Gretta Cool, BSN, RN, CWS, Kim RN, BSN Entered By: Gretta Cool, BSN, RN, CWS, Kim on 10/08/2016 11:46:49 Karen Bowman ([id number]) -------------------------------------------------------------------------------- [place] Details Patient Name: Karen Bowman Date of Service: 10/08/2016 11:00 AM Medical Record Number: [id number] Patient Account Number: [id number] Date of Birth/Sex: [birth year] (76 y.o. Female) Treating RN: Cornell Barman Primary Care Shykeem Resurreccion: [name] Other Clinician: Referring Athol Bolds: [name] Treating Kearia Yin/Extender: Worthy Keeler Weeks in Treatment: 6 Active Inactive Electronic Signature(s) Signed: 10/08/2016 5:36:29 PM By: Gretta Cool, BSN, RN, CWS, Kim RN, BSN Entered By: Gretta Cool, BSN, RN, CWS, Kim on 10/08/2016 11:46:04 Karen Bowman ([id number]) -------------------------------------------------------------------------------- Pain Assessment Details Patient Name: Karen Bowman Date of Service: 10/08/2016 11:00 AM Medical Record Number: [id number] Patient Account Number: [id number] Date of Birth/Sex: 1940-04-22 (76 y.o. Female) Treating RN: Cornell Barman Primary Care Elliotte Marsalis: [name] Other Clinician: Referring Lismary Kiehn: [name] Treating Shina Wass/Extender: Melburn Hake, HOYT Weeks in Treatment: 6 Active Problems Location of Pain Severity and Description of Pain Patient Has Paino No Site Locations With Dressing Change: No Pain Management and Medication Current Pain Management: Goals for Pain Management Topical or injectable lidocaine is  offered to patient for acute pain when surgical debridement is performed. If needed, Patient is instructed to use over the counter pain medication for the following 24-48 hours after debridement. Wound care MDs do not prescribed pain medications. Patient has chronic pain or uncontrolled pain. Patient has been instructed to make an appointment with their Primary Care Physician for pain management. Electronic Signature(s) Signed: 10/08/2016 5:36:29 PM By: Gretta Cool, BSN, RN, CWS, Kim RN, BSN Entered By: Gretta Cool, BSN, RN, CWS, Kim on 10/08/2016 11:12:17 Karen Bowman ([id number]) -------------------------------------------------------------------------------- Patient/Caregiver Education Details Patient Name: Karen Bowman Date of Service: 10/08/2016 11:00 AM Medical Record Number: [id number] Patient Account Number: [id number] Date of Birth/Gender: [birth year] (76 y.o. Female) Treating RN: Cornell Barman Primary Care Physician: [name] Other Clinician: Referring Physician: [name] Treating Physician/Extender: Melburn Hake, HOYT Weeks in Treatment:  6 Education Assessment Education Provided To: Patient Education Topics Provided Wound/Skin Impairment: Handouts: Other: protect new skin for the next week Responses: State content correctly Electronic Signature(s) Signed: 10/08/2016 5:36:29 PM By: Gretta Cool, BSN, RN, CWS, Kim RN, BSN Entered By: Gretta Cool, BSN, RN, CWS, Kim on 10/08/2016 11:49:35 Karen Bowman ([id number]) -------------------------------------------------------------------------------- Wound Assessment Details Patient Name: Karen Bowman Date of Service: 10/08/2016 11:00 AM Medical Record Number: [id number] Patient Account Number: [id number] Date of Birth/Sex: December 06, 1940 (76 y.o. Female) Treating RN: Cornell Barman Primary Care Araina Butrick: [name] Other Clinician: Referring Jw Covin: [name] Treating Sholanda Croson/Extender: Melburn Hake, HOYT Weeks in Treatment: 6 Wound  Status Wound Number: 1 Primary Vasculopathy Etiology: Wound Location: Right Ankle - Medial Wound Healed - Epithelialized Wounding Event: Gradually Appeared Status: Date Acquired: 09/30/2015 Comorbid Arrhythmia, Coronary Artery Disease, Weeks Of Treatment: 6 History: Peripheral Arterial Disease, Peripheral Clustered Wound: No Venous Disease, Osteoarthritis, Neuropathy Photos Wound Measurements Length: (cm) 0 % Reduction in Width: (cm) 0 % Reduction in Depth: (cm) 0 Epithelializat Area: (cm) 0 Tunneling: Volume: (cm) 0 Undermining: Area: 100% Volume: 100% ion: Large (67-100%) No No Wound Description Full Thickness Without Exposed Foul Odor Afte Classification: Support Structures Slough/Fibrino Wound Margin: Distinct, outline attached Exudate None Present Amount: r Cleansing: No No Wound Bed Granulation Amount: None Present (0%) Exposed Structure Necrotic Amount: None Present (0%) Fascia Exposed: No Fat Layer (Subcutaneous Tissue) Exposed: No Tendon Exposed: No Muscle Exposed: No Joint Exposed: No Karen Bowman, Karen Bowman ([id number]) Bone Exposed: No Periwound Skin Texture Texture Color No Abnormalities Noted: No No Abnormalities Noted: No Callus: No Atrophie Blanche: No Crepitus: No Cyanosis: No Excoriation: No Ecchymosis: No Induration: No Erythema: No Rash: No Hemosiderin Staining: Yes Scarring: Yes Mottled: No Pallor: No Moisture Rubor: No No Abnormalities Noted: No Dry / Scaly: No Temperature / Pain Maceration: No Temperature: No Abnormality Tenderness on Palpation: Yes Wound Preparation Ulcer Cleansing: Rinsed/Irrigated with Saline, Other: soap and water, Topical Anesthetic Applied: None Electronic Signature(s) Signed: 10/08/2016 5:36:29 PM By: Gretta Cool, BSN, RN, CWS, Kim RN, BSN Entered By: Gretta Cool, BSN, RN, CWS, Kim on 10/08/2016 11:45:17 Karen Bowman  ([id number]) -------------------------------------------------------------------------------- Vitals Details Patient Name: Karen Bowman Date of Service: 10/08/2016 11:00 AM Medical Record Number: [id number] Patient Account Number: [id number] Date of Birth/Sex: 06/01/1940 (76 y.o. Female) Treating RN: Cornell Barman Primary Care Tremel Setters: [name] Other Clinician: Referring Vera Wishart: [name] Treating Brayleigh Rybacki/Extender: Melburn Hake, HOYT Weeks in Treatment: 6 Vital Signs Time Taken: 11:12 Temperature (F): 97.8 Height (in): 65 Pulse (bpm): 50 Weight (lbs): 132 Respiratory Rate (breaths/min): 16 Body Mass Index (BMI): 22 Blood Pressure (mmHg): 178/60 Reference Range: 80 - 120 mg / dl Electronic Signature(s) Signed: 10/08/2016 5:36:29 PM By: Gretta Cool, BSN, RN, CWS, Kim RN, BSN Entered By: Gretta Cool, BSN, RN, CWS, Kim on 10/08/2016 11:12:37

## 2016-10-09 NOTE — Progress Notes (Signed)
BREYA, CASS ([id number]) Visit Report for 10/08/2016 Chief Complaint Document Details Patient Name: Karen Bowman, Karen Bowman Date of Service: 10/08/2016 11:00 AM Medical Record Number: [id number] Patient Account Number: [id number] Date of Birth/Sex: 1940/10/09 (76 y.o. Female) Treating RN: Cornell Barman Primary Care Provider: [name] Other Clinician: Referring Provider: [name] Treating Provider/Extender: Melburn Hake, HOYT Weeks in Treatment: 6 Information Obtained from: Patient Chief Complaint 08/27/16; patient is here for review of a chronic wound on the right medial ankle that has been present for one year Electronic Signature(s) Signed: 10/08/2016 7:35:55 PM By: Worthy Keeler PA-C Entered By: Worthy Keeler on 10/08/2016 13:15:44 Karen Bowman ([id number]) -------------------------------------------------------------------------------- HPI Details Patient Name: Karen Bowman Date of Service: 10/08/2016 11:00 AM Medical Record Number: [id number] Patient Account Number: [id number] Date of Birth/Sex: 1940-04-18 (76 y.o. Female) Treating RN: Cornell Barman Primary Care Provider: [name] Other Clinician: Referring Provider: [name] Treating Provider/Extender: Melburn Hake, HOYT Weeks in Treatment: 6 History of Present Illness HPI Description: 08/27/16; his is a 76 year old woman who has been dealing with a ulcer on her right medial ankle for about a year. She is uncertain about how this actually opened however it is not really changed that much in quite some time. She has a history of bleeding per Deniece Ree these on the left more remotely but no other ulcer history. She is not a diabetic but she does have chronic right leg greater than left leg swelling. Looking through [employer] she has a history of eczema, systolic congestive heart failure left CVA and paroxysmal A. fib on Eliquis. She has been using Neosporin and more recently Bactroban without improvement.  She saw her primary physician who referred her here, notes they're suggesting Coban and gauze. Dimensions of the wounds 1.7 x 2 x 0.1. ABIs in this clinic were 1.0 bilaterally. 09/03/16; wound dimensions are smaller. Most of the wound still covered in an adherent necrotic surface. We have been using Santyl under compression. Patient complains of pain and discomfort 09/10/16; wound dimensions on the right medial malleolus are improved. Condition of the wound bed is improved. We have been using Santyl under compression. She has chronic venous inflammation around the wound which is tender. 09/17/16; 1 x 1.2 x 0.1. Surface of this looks better. No debridement. No evidence of surrounding infection. For some reason her reflux studies are booked for late July although she appears to be on some sort of cancellation list 09/24/16; the wound is about half the size this week. Still some nonviable tissue around the circumference and some necrotic material over the surface of the wound that were debridement today but overall this looks like it is well on its way to closure. 10/01/16; the wound is down to a very small open area. Arrived today with collagen stuck on the wound surface. It was difficult to remove this 10/08/16 on evaluation today patient's wound appears to be completely healed. Patient actually things in disbelief that this day had finally been reached. Fortunately she is having no discomfort. Electronic Signature(s) Signed: 10/08/2016 7:35:55 PM By: Worthy Keeler PA-C Entered By: Worthy Keeler on 10/08/2016 13:16:06 Karen Bowman ([id number]) -------------------------------------------------------------------------------- Physical Exam Details Patient Name: Karen Bowman Date of Service: 10/08/2016 11:00 AM Medical Record Number: [id number] Patient Account Number: [id number] Date of Birth/Sex: [birth year] (76 y.o. Female) Treating RN: Cornell Barman Primary Care Provider: [name] Other Clinician: Referring Provider: [name] Treating Provider/Extender: Melburn Hake, HOYT Weeks in Treatment: 6 Constitutional Well-nourished and well-hydrated  in no acute distress. Respiratory normal breathing without difficulty. Psychiatric this patient is able to make decisions and demonstrates good insight into disease process. Alert and Oriented x 3. pleasant and cooperative. Notes Patient had a slight amount dry skin/eschar over the area of the wound although I was able to see the central portion and this appears to be healed without evidence of drainage. Electronic Signature(s) Signed: 10/08/2016 7:35:55 PM By: Worthy Keeler PA-C Entered By: Worthy Keeler on 10/08/2016 13:16:43 Karen Bowman ([id number]) -------------------------------------------------------------------------------- Physician Orders Details Patient Name: Karen Bowman Date of Service: 10/08/2016 11:00 AM Medical Record Number: [id number] Patient Account Number: [id number] Date of Birth/Sex: [birth year] (76 y.o. Female) Treating RN: Cornell Barman Primary Care Provider: [name] Other Clinician: Referring Provider: [name] Treating Provider/Extender: Melburn Hake, HOYT Weeks in Treatment: 6 Verbal / Phone Orders: No Diagnosis Coding Discharge From [facility] Services o Discharge from [place] complete Notes At this time I recommend that you keep this protected and covered for the next week and then discontinue that protection as well. We are going to discontinue wound care services if she has any concerns in the future she will contact us for further evaluation although I'm hopeful that will not be necessary. Electronic Signature(s) Signed: 10/08/2016 7:35:55 PM By: Worthy Keeler PA-C Entered By: Worthy Keeler on 10/08/2016 13:17:16 Karen Bowman ([id number]) -------------------------------------------------------------------------------- Problem List  Details Patient Name: Karen Bowman Date of Service: 10/08/2016 11:00 AM Medical Record Number: [id number] Patient Account Number: [id number] Date of Birth/Sex: Aug 15, 1940 (76 y.o. Female) Treating RN: Cornell Barman Primary Care Provider: [name] Other Clinician: Referring Provider: [name] Treating Provider/Extender: Worthy Keeler Weeks in Treatment: 6 Active Problems ICD-10 Encounter Code Description Active Date Diagnosis L97.311 Non-pressure chronic ulcer of right ankle limited to 08/27/2016 Yes breakdown of skin I87.331 Chronic venous hypertension (idiopathic) with ulcer and 08/27/2016 Yes inflammation of right lower extremity Inactive Problems Resolved Problems Electronic Signature(s) Signed: 10/08/2016 7:35:55 PM By: Worthy Keeler PA-C Entered By: Worthy Keeler on 10/08/2016 13:15:19 Karen Bowman ([id number]) -------------------------------------------------------------------------------- Progress Note Details Patient Name: Karen Bowman Date of Service: 10/08/2016 11:00 AM Medical Record Number: [id number] Patient Account Number: [id number] Date of Birth/Sex: 1940/07/14 (76 y.o. Female) Treating RN: Cornell Barman Primary Care Provider: [name] Other Clinician: Referring Provider: [name] Treating Provider/Extender: Melburn Hake, HOYT Weeks in Treatment: 6 Subjective Chief Complaint Information obtained from Patient 08/27/16; patient is here for review of a chronic wound on the right medial ankle that has been present for one year History of Present Illness (HPI) 08/27/16; his is a 76 year old woman who has been dealing with a ulcer on her right medial ankle for about a year. She is uncertain about how this actually opened however it is not really changed that much in quite some time. She has a history of bleeding per Deniece Ree these on the left more remotely but no other ulcer history. She is not a diabetic but she does have chronic right leg  greater than left leg swelling. Looking through [employer] she has a history of eczema, systolic congestive heart failure left CVA and paroxysmal A. fib on Eliquis. She has been using Neosporin and more recently Bactroban without improvement. She saw her primary physician who referred her here, notes they're suggesting Coban and gauze. Dimensions of the wounds 1.7 x 2 x 0.1. ABIs in this clinic were 1.0 bilaterally. 09/03/16; wound dimensions are smaller. Most of the wound  still covered in an adherent necrotic surface. We have been using Santyl under compression. Patient complains of pain and discomfort 09/10/16; wound dimensions on the right medial malleolus are improved. Condition of the wound bed is improved. We have been using Santyl under compression. She has chronic venous inflammation around the wound which is tender. 09/17/16; 1 x 1.2 x 0.1. Surface of this looks better. No debridement. No evidence of surrounding infection. For some reason her reflux studies are booked for late July although she appears to be on some sort of cancellation list 09/24/16; the wound is about half the size this week. Still some nonviable tissue around the circumference and some necrotic material over the surface of the wound that were debridement today but overall this looks like it is well on its way to closure. 10/01/16; the wound is down to a very small open area. Arrived today with collagen stuck on the wound surface. It was difficult to remove this 10/08/16 on evaluation today patient's wound appears to be completely healed. Patient actually things in disbelief that this day had finally been reached. Fortunately she is having no discomfort. Karen Bowman, Karen Bowman ([id number]) Objective Constitutional Well-nourished and well-hydrated in no acute distress. Vitals Time Taken: 11:12 AM, Height: 65 in, Weight: 132 lbs, BMI: 22, Temperature: 97.8 F, Pulse: 50 bpm, Respiratory Rate: 16 breaths/min, Blood  Pressure: 178/60 mmHg. Respiratory normal breathing without difficulty. Psychiatric this patient is able to make decisions and demonstrates good insight into disease process. Alert and Oriented x 3. pleasant and cooperative. General Notes: Patient had a slight amount dry skin/eschar over the area of the wound although I was able to see the central portion and this appears to be healed without evidence of drainage. Integumentary (Hair, Skin) Wound #1 status is Healed - Epithelialized. Original cause of wound was Gradually Appeared. The wound is located on the Right,Medial Ankle. The wound measures 0cm length x 0cm width x 0cm depth; 0cm^2 area and 0cm^3 volume. There is no tunneling or undermining noted. There is a none present amount of drainage noted. The wound margin is distinct with the outline attached to the wound base. There is no granulation within the wound bed. There is no necrotic tissue within the wound bed. The periwound skin appearance exhibited: Scarring, Hemosiderin Staining. The periwound skin appearance did not exhibit: Callus, Crepitus, Excoriation, Induration, Rash, Dry/Scaly, Maceration, Atrophie Blanche, Cyanosis, Ecchymosis, Mottled, Pallor, Rubor, Erythema. Periwound temperature was noted as No Abnormality. The periwound has tenderness on palpation. Assessment Active Problems ICD-10 X93.716 - Non-pressure chronic ulcer of right ankle limited to breakdown of skin I87.331 - Chronic venous hypertension (idiopathic) with ulcer and inflammation of right lower extremity Karen Bowman, Karen Bowman ([id number]) Plan Discharge From [facility] Services: Discharge from [place] complete General Notes: At this time I recommend that you keep this protected and covered for the next week and then discontinue that protection as well. We are going to discontinue wound care services if she has any concerns in the future she will contact us for further evaluation although I'm  hopeful that will not be necessary. Electronic Signature(s) Signed: 10/08/2016 7:35:55 PM By: Worthy Keeler PA-C Entered By: Worthy Keeler on 10/08/2016 13:17:24 Karen Bowman ([id number]) -------------------------------------------------------------------------------- SuperBill Details Patient Name: Karen Bowman Date of Service: 10/08/2016 Medical Record Number: [id number] Patient Account Number: [id number] Date of Birth/Sex: 11-25-1940 (76 y.o. Female) Treating RN: Cornell Barman Primary Care Provider: [name] Other Clinician: Referring Provider: [name] Treating Provider/Extender: Joaquim Lai  III, HOYT Weeks in Treatment: 6 Diagnosis Coding ICD-10 Codes Code Description H43.888 Non-pressure chronic ulcer of right ankle limited to breakdown of skin Chronic venous hypertension (idiopathic) with ulcer and inflammation of right lower I87.331 extremity Facility Procedures CPT4 Code: [id number] Description: [phone] - WOUND CARE VISIT-LEV 2 EST PT Modifier: Quantity: 1 Physician Procedures CPT4: Description Modifier Quantity Code [id number] - WC PHYS LEVEL 2 - EST PT 1 ICD-10 Description Diagnosis Y70.929 Non-pressure chronic ulcer of right ankle limited to breakdown of skin I87.331 Chronic venous hypertension (idiopathic) with ulcer  and inflammation of right lower extremity Electronic Signature(s) Signed: 10/08/2016 7:35:55 PM By: Worthy Keeler PA-C Entered By: Worthy Keeler on 10/08/2016 13:19:34

## 2016-10-23 ENCOUNTER — Other Ambulatory Visit: Payer: Self-pay | Admitting: Internal Medicine

## 2016-10-23 DIAGNOSIS — L97919 Non-pressure chronic ulcer of unspecified part of right lower leg with unspecified severity: Secondary | ICD-10-CM

## 2016-10-24 ENCOUNTER — Ambulatory Visit (INDEPENDENT_AMBULATORY_CARE_PROVIDER_SITE_OTHER): Payer: Medicare PPO | Admitting: Vascular Surgery

## 2016-10-24 ENCOUNTER — Ambulatory Visit (INDEPENDENT_AMBULATORY_CARE_PROVIDER_SITE_OTHER): Payer: Medicare PPO

## 2016-10-24 ENCOUNTER — Encounter (INDEPENDENT_AMBULATORY_CARE_PROVIDER_SITE_OTHER): Payer: Self-pay | Admitting: Vascular Surgery

## 2016-10-24 VITALS — BP 159/77 | HR 61 | Resp 16 | Ht 65.0 in | Wt 134.8 lb

## 2016-10-24 DIAGNOSIS — I480 Paroxysmal atrial fibrillation: Secondary | ICD-10-CM

## 2016-10-24 DIAGNOSIS — I10 Essential (primary) hypertension: Secondary | ICD-10-CM | POA: Diagnosis not present

## 2016-10-24 DIAGNOSIS — L97319 Non-pressure chronic ulcer of right ankle with unspecified severity: Secondary | ICD-10-CM | POA: Diagnosis not present

## 2016-10-24 DIAGNOSIS — I83013 Varicose veins of right lower extremity with ulcer of ankle: Secondary | ICD-10-CM

## 2016-10-24 DIAGNOSIS — E782 Mixed hyperlipidemia: Secondary | ICD-10-CM

## 2016-10-24 DIAGNOSIS — I872 Venous insufficiency (chronic) (peripheral): Secondary | ICD-10-CM | POA: Diagnosis not present

## 2016-10-24 DIAGNOSIS — L97919 Non-pressure chronic ulcer of unspecified part of right lower leg with unspecified severity: Secondary | ICD-10-CM

## 2016-10-24 NOTE — Progress Notes (Signed)
MRN : [id number]  Karen Bowman is a 76 y.o. (11/02/1940) female who presents with chief complaint of  Chief Complaint  Patient presents with  . New Evaluation    Right lower extr. venous reflux  .  History of Present Illness: Patient is seen for evaluation of leg pain and swelling associated with new onset ulceration of the right ankle. The patient first noticed the swelling remotely. The swelling is associated with pain and discoloration. The pain and swelling worsens with prolonged dependency and improves with elevation. The pain is unrelated to activity.  The patient notes that in the morning the legs are better but the leg symptoms worsened throughout the course of the day. The patient has also noted a progressive worsening of the discoloration in the ankle and shin area.   The patient notes that an ulcer developed several months ago without specific trauma and since it occurred it has been very slow to heal.  There is a moderate amount of drainage associated with the open area.  The wound is also very painful.  The patient denies claudication symptoms or rest pain symptoms.  The patient denies DJD and LS spine disease.  The patient has not had any past angiography, interventions or vascular surgery.  Elevation makes the leg symptoms better, dependency makes them much worse. The patient denies any recent changes in medications.  The patient has not been wearing graduated compression.  The patient denies a history of DVT or PE. There is no prior history of phlebitis. There is no history of primary lymphedema.  No history of malignancies. No history of trauma or groin or pelvic surgery. There is no history of radiation treatment to the groin or pelvis     Current Meds  Medication Sig  . amiodarone (PACERONE) 200 MG tablet TAKE 1 TABLET EVERY DAY  . amLODipine (NORVASC) 2.5 MG tablet TAKE 1 TABLET EVERY DAY  . atorvastatin (LIPITOR) 40 MG tablet TAKE 1 TABLET DAILY AT 6 PM.    . Cholecalciferol 1000 UNITS capsule Take 2,000 Units by mouth daily.   Marland Kitchen CINNAMON PO Take 2 tablets by mouth daily.   . Coenzyme Q10-Fish Oil-Vit E (CO-Q 10 OMEGA-3 FISH OIL) CAPS Take 1 capsule by mouth 2 (two) times daily.  Marland Kitchen CRANBERRY PO Take 1 tablet by mouth 2 (two) times daily.   Marland Kitchen ELIQUIS 5 MG TABS tablet TAKE 1 TABLET TWICE DAILY  . EYEBRIGHT PO Take 1 tablet by mouth daily.  Marland Kitchen levocetirizine (XYZAL) 5 MG tablet Take 1 tablet (5 mg total) by mouth every evening.  . loratadine (CLARITIN) 10 MG tablet Take 1 tablet (10 mg total) by mouth daily.  . metoprolol tartrate (LOPRESSOR) 25 MG tablet TAKE 1/2 TABLET TWICE DAILY  . MISC NATURAL PRODUCTS PO Take 1 tablet by mouth daily. Multivitamin-Dr. Talmage Coin  . mupirocin cream (BACTROBAN) 2 % Apply 1 application topically 2 (two) times daily.    Past Medical History:  Diagnosis Date  . Acute heart failure ([place])   . Anxiety   . Arrhythmia   . Arthritis   . Atrial fibrillation ([place])   . Heart murmur   . Hyperlipemia   . Hypertension   . Stroke [facility])     Past Surgical History:  Procedure Laterality Date  . ABDOMINAL HYSTERECTOMY    . APPENDECTOMY    . BLADDER SUSPENSION    . ELECTROPHYSIOLOGIC STUDY N/A 10/18/2014   Procedure: CARDIOVERSION;  Surgeon: Thayer Headings, MD;  Location: [facility] ORS;  Service: Cardiovascular;  Laterality: N/A;  . TEE WITHOUT CARDIOVERSION N/A 10/18/2014   Procedure: TRANSESOPHAGEAL ECHOCARDIOGRAM (TEE);  Surgeon: Thayer Headings, MD;  Location: [facility] ORS;  Service: Cardiovascular;  Laterality: N/A;  . TONSILLECTOMY    . TUMOR REMOVAL     right arm    Social History Social History  Substance Use Topics  . Smoking status: Never Smoker  . Smokeless tobacco: Never Used  . Alcohol use No    Family History Family History  Problem Relation Age of Onset  . Osteoporosis Mother   . Heart disease Mother   . Stroke Mother   . Cancer Father   . Diabetes Maternal Aunt   No family history of  bleeding/clotting disorders, porphyria or autoimmune disease   Allergies  Allergen Reactions  . Ciprofloxacin     REACTION: dizzy  . Ciprofloxacin Nausea And Vomiting  . Hydrochlorothiazide Nausea And Vomiting  . Hydrochlorothiazide W-Triamterene     REACTION: nausea  . Sulfa Antibiotics Nausea And Vomiting  . Sulfonamide Derivatives     REACTION: nausea \\T \ vomiting  . Valsartan     REACTION: angioedema - probable  . Valsartan Nausea And Vomiting     REVIEW OF SYSTEMS (Negative unless checked)  Constitutional: [] Weight loss  [] Fever  [] Chills Cardiac: [] Chest pain   [] Chest pressure   [] Palpitations   [] Shortness of breath when laying flat   [] Shortness of breath with exertion. Vascular:  [] Pain in legs with walking   [] Pain in legs at rest  [] History of DVT   [] Phlebitis   [x] Swelling in legs   [x] Varicose veins   [x] Non-healing ulcers Pulmonary:   [] Uses home oxygen   [] Productive cough   [] Hemoptysis   [] Wheeze  [] COPD   [] Asthma Neurologic:  [] Dizziness   [] Seizures   [] History of stroke   [] History of TIA  [] Aphasia   [] Vissual changes   [] Weakness or numbness in arm   [] Weakness or numbness in leg Musculoskeletal:   [] Joint swelling   [x] Joint pain   [] Low back pain Hematologic:  [] Easy bruising  [] Easy bleeding   [] Hypercoagulable state   [] Anemic Gastrointestinal:  [] Diarrhea   [] Vomiting  [] Gastroesophageal reflux/heartburn   [] Difficulty swallowing. Genitourinary:  [] Chronic kidney disease   [] Difficult urination  [] Frequent urination   [] Blood in urine Skin:  [x] Rashes   [x] Ulcers  Psychological:  [] History of anxiety   []  History of major depression.  Physical Examination  Vitals:   10/24/16 1155  BP: (!) 159/77  Pulse: 61  Resp: 16  Weight: 134 lb 12.8 oz (61.1 kg)  Height: 5\' 5"  (1.651 m)   Body mass index is 22.43 kg/m. Gen: WD/WN, NAD Head: [place]/AT, No temporalis wasting.  Ear/Nose/Throat: Hearing grossly intact, nares w/o erythema or drainage, poor  dentition Eyes: PER, EOMI, sclera nonicteric.  Neck: Supple, no masses.  No bruit or JVD.  Pulmonary:  Good air movement, clear to auscultation bilaterally, no use of accessory muscles.  Cardiac: RRR, normal S1, S2, no Murmurs. Vascular: 2-3+ edema of the right leg with moderate venous changes of the right leg.  Venous ulcer noted in the ankle area on the right, noninfected Gastrointestinal: soft, non-distended. No guarding/no peritoneal signs.  Musculoskeletal: M/S 5/5 throughout.  No deformity or atrophy.  Neurologic: CN 2-12 intact. Pain and light touch intact in extremities.  Symmetrical.  Speech is fluent. Motor exam as listed above. Psychiatric: Judgment intact, Mood & affect appropriate for pt's clinical situation. Dermatologic: Venous stasis dermatitis with ulcers present on the  right.  No changes consistent with cellulitis. Lymph : No Cervical lymphadenopathy, no lichenification or skin changes of chronic lymphedema.  CBC Lab Results  Component Value Date   WBC 5.0 07/04/2015   HGB 13.4 07/04/2015   HCT 41.2 07/04/2015   MCV 95.4 07/04/2015   PLT 196 07/04/2015    BMET    Component Value Date/Time   NA 142 01/24/2016 1132   NA 141 12/09/2014 1533   K 4.1 01/24/2016 1132   CL 106 01/24/2016 1132   CO2 29 01/24/2016 1132   GLUCOSE 88 01/24/2016 1132   BUN 17 01/24/2016 1132   BUN 22 12/09/2014 1533   CREATININE 0.74 01/24/2016 1132   CALCIUM 9.3 01/24/2016 1132   GFRNONAA >60 07/04/2015 0353   GFRAA >60 07/04/2015 0353   CrCl cannot be calculated (Patient's most recent lab result is older than the maximum 21 days allowed.).  COAG Lab Results  Component Value Date   INR 1.22 06/27/2015   INR 1.05 10/14/2014    Radiology No results found.  Assessment/Plan 1. Venous ulcer of ankle, right (HCC) No surgery or intervention at this point in time.    I have had a long discussion with the patient regarding venous insufficiency and why it  causes symptoms,  specifically venous ulceration . I have discussed with the patient the chronic skin changes that accompany venous insufficiency and the long term sequela such as infection and recurring  ulceration.  Patient will begin using compression immediately and this was stressed.  In addition, behavioral modification including several periods of elevation of the lower extremities during the day will be continued. Achieving a position with the ankles at heart level was stressed to the patient  The patient is instructed to begin routine exercise, especially walking on a daily basis  Following the review of the ultrasound the patient will follow up in one week to reassess the degree of swelling and the control that Unna therapy is offering.   The patient can be assessed for graduated compression stockings or wraps as well as a Lymph Pump once the ulcers are healed.   2. Chronic venous insufficiency No surgery or intervention at this point in time.    I have had a long discussion with the patient regarding venous insufficiency and why it  causes symptoms. I have discussed with the patient the chronic skin changes that accompany venous insufficiency and the long term sequela such as infection and ulceration.  Patient will begin wearing graduated compression stockings class 1 (20-30 mmHg) or compression wraps on a daily basis a prescription was given. The patient will put the stockings on first thing in the morning and removing them in the evening. The patient is instructed specifically not to sleep in the stockings.    In addition, behavioral modification including several periods of elevation of the lower extremities during the day will be continued. I have demonstrated that proper elevation is a position with the ankles at heart level.  The patient is instructed to begin routine exercise, especially walking on a daily basis  Following the review of the ultrasound the patient will follow up in1 month to  reassess the degree of swelling and the control that graduated compression stockings or compression wraps  is offering.   The patient can be assessed for a Lymph Pump at that time  3. HYPERTENSION, BENIGN ESSENTIAL Continue antihypertensive medications as already ordered, these medications have been reviewed and there are no changes at this time.  4. Paroxysmal atrial fibrillation (HCC) Continue antiarrhythmia medications as already ordered, these medications have been reviewed and there are no changes at this time.  Continue anticoagulation as ordered by Cardiology Service   5. Mixed hyperlipidemia Continue statin as ordered and reviewed, no changes at this time     Hortencia Pilar, MD  10/24/2016 12:33 PM

## 2016-12-05 ENCOUNTER — Encounter (INDEPENDENT_AMBULATORY_CARE_PROVIDER_SITE_OTHER): Payer: Self-pay | Admitting: Vascular Surgery

## 2016-12-05 ENCOUNTER — Ambulatory Visit (INDEPENDENT_AMBULATORY_CARE_PROVIDER_SITE_OTHER): Payer: Medicare PPO | Admitting: Vascular Surgery

## 2016-12-05 VITALS — BP 118/68 | HR 41 | Resp 15 | Ht 63.0 in | Wt 133.0 lb

## 2016-12-05 DIAGNOSIS — I480 Paroxysmal atrial fibrillation: Secondary | ICD-10-CM

## 2016-12-05 DIAGNOSIS — I872 Venous insufficiency (chronic) (peripheral): Secondary | ICD-10-CM | POA: Diagnosis not present

## 2016-12-05 DIAGNOSIS — I890 Lymphedema, not elsewhere classified: Secondary | ICD-10-CM | POA: Diagnosis not present

## 2016-12-05 DIAGNOSIS — I5022 Chronic systolic (congestive) heart failure: Secondary | ICD-10-CM

## 2016-12-05 DIAGNOSIS — I63312 Cerebral infarction due to thrombosis of left middle cerebral artery: Secondary | ICD-10-CM

## 2016-12-08 DIAGNOSIS — I890 Lymphedema, not elsewhere classified: Secondary | ICD-10-CM | POA: Insufficient documentation

## 2016-12-08 NOTE — Progress Notes (Signed)
MRN : [id number]  Karen Bowman is a 76 y.o. (Jan 30, 1941) female who presents with chief complaint of  Chief Complaint  Patient presents with  . Follow-up    1 month no studies  .  History of Present Illness: The patient returns to the office for followup evaluation regarding leg swelling.  The swelling has improved quite a bit and the pain associated with swelling has decreased substantially. There have not been any interval development of a ulcerations or wounds.  Since the previous visit the patient has been wearing graduated compression stockings and has noted little significant improvement in the lymphedema. The patient has been using compression routinely morning until night.  The patient also states elevation during the day and exercise is being done too.     No outpatient prescriptions have been marked as taking for the 12/05/16 encounter (Office Visit) with Delana Meyer, Dolores Lory, MD.    Past Medical History:  Diagnosis Date  . Acute heart failure ([place])   . Anxiety   . Arrhythmia   . Arthritis   . Atrial fibrillation ([place])   . Heart murmur   . Hyperlipemia   . Hypertension   . Stroke [facility])     Past Surgical History:  Procedure Laterality Date  . ABDOMINAL HYSTERECTOMY    . APPENDECTOMY    . BLADDER SUSPENSION    . ELECTROPHYSIOLOGIC STUDY N/A 10/18/2014   Procedure: CARDIOVERSION;  Surgeon: Thayer Headings, MD;  Location: [facility] ORS;  Service: Cardiovascular;  Laterality: N/A;  . TEE WITHOUT CARDIOVERSION N/A 10/18/2014   Procedure: TRANSESOPHAGEAL ECHOCARDIOGRAM (TEE);  Surgeon: Thayer Headings, MD;  Location: [facility] ORS;  Service: Cardiovascular;  Laterality: N/A;  . TONSILLECTOMY    . TUMOR REMOVAL     right arm    Social History Social History  Substance Use Topics  . Smoking status: Never Smoker  . Smokeless tobacco: Never Used  . Alcohol use No    Family History Family History  Problem Relation Age of Onset  . Osteoporosis Mother   . Heart disease  Mother   . Stroke Mother   . Cancer Father   . Diabetes Maternal Aunt     Allergies  Allergen Reactions  . Ciprofloxacin     REACTION: dizzy  . Ciprofloxacin Nausea And Vomiting  . Hydrochlorothiazide Nausea And Vomiting  . Hydrochlorothiazide W-Triamterene     REACTION: nausea  . Sulfa Antibiotics Nausea And Vomiting  . Sulfonamide Derivatives     REACTION: nausea \\T \ vomiting  . Valsartan     REACTION: angioedema - probable  . Valsartan Nausea And Vomiting     REVIEW OF SYSTEMS (Negative unless checked)  Constitutional: [] Weight loss  [] Fever  [] Chills Cardiac: [] Chest pain   [] Chest pressure   [] Palpitations   [] Shortness of breath when laying flat   [] Shortness of breath with exertion. Vascular:  [] Pain in legs with walking   [] Pain in legs at rest  [] History of DVT   [] Phlebitis   [x] Swelling in legs   [x] Varicose veins   [] Non-healing ulcers Pulmonary:   [] Uses home oxygen   [] Productive cough   [] Hemoptysis   [] Wheeze  [] COPD   [] Asthma Neurologic:  [] Dizziness   [] Seizures   [] History of stroke   [] History of TIA  [] Aphasia   [] Vissual changes   [] Weakness or numbness in arm   [] Weakness or numbness in leg Musculoskeletal:   [] Joint swelling   [] Joint pain   [] Low back pain Hematologic:  [] Easy bruising  []   Easy bleeding   [] Hypercoagulable state   [] Anemic Gastrointestinal:  [] Diarrhea   [] Vomiting  [] Gastroesophageal reflux/heartburn   [] Difficulty swallowing. Genitourinary:  [] Chronic kidney disease   [] Difficult urination  [] Frequent urination   [] Blood in urine Skin:  [] Rashes   [] Ulcers  Psychological:  [] History of anxiety   []  History of major depression.  Physical Examination  Vitals:   12/05/16 1333  BP: 118/68  Pulse: (!) 41  Resp: 15  Weight: 60.3 kg (133 lb)  Height: 5\' 3"  (1.6 m)   Body mass index is 23.56 kg/m. Gen: WD/WN, NAD Head: [place]/AT, No temporalis wasting.  Ear/Nose/Throat: Hearing grossly intact, nares w/o erythema or drainage Eyes:  PER, EOMI, sclera nonicteric.  Neck: Supple, no large masses.   Pulmonary:  Good air movement, no audible wheezing bilaterally, no use of accessory muscles.  Cardiac: RRR, no JVD Vascular: scattered varicosities present bilaterally.  Moderate venous stasis changes to the legs bilaterally.  2+ soft pitting edema Vessel Right Left  Radial Palpable Palpable  PT Palpable Palpable  DP Palpable Palpable  Gastrointestinal: Non-distended. No guarding/no peritoneal signs.  Musculoskeletal: M/S 5/5 throughout.  No deformity or atrophy.  Neurologic: CN 2-12 intact. Symmetrical.  Speech is fluent. Motor exam as listed above. Psychiatric: Judgment intact, Mood & affect appropriate for pt's clinical situation. Dermatologic: Venous rashes no ulcers noted.  No changes consistent with cellulitis. Lymph : No lichenification or skin changes of chronic lymphedema.  CBC Lab Results  Component Value Date   WBC 5.0 07/04/2015   HGB 13.4 07/04/2015   HCT 41.2 07/04/2015   MCV 95.4 07/04/2015   PLT 196 07/04/2015    BMET    Component Value Date/Time   NA 142 01/24/2016 1132   NA 141 12/09/2014 1533   K 4.1 01/24/2016 1132   CL 106 01/24/2016 1132   CO2 29 01/24/2016 1132   GLUCOSE 88 01/24/2016 1132   BUN 17 01/24/2016 1132   BUN 22 12/09/2014 1533   CREATININE 0.74 01/24/2016 1132   CALCIUM 9.3 01/24/2016 1132   GFRNONAA >60 07/04/2015 0353   GFRAA >60 07/04/2015 0353   CrCl cannot be calculated (Patient's most recent lab result is older than the maximum 21 days allowed.).  COAG Lab Results  Component Value Date   INR 1.22 06/27/2015   INR 1.05 10/14/2014    Radiology No results found.  Assessment/Plan 1. Chronic venous insufficiency No surgery or intervention at this point in time.    I have had a long discussion with the patient regarding venous insufficiency and why it  causes symptoms. I have discussed with the patient the chronic skin changes that accompany venous insufficiency  and the long term sequela such as infection and ulceration.  Patient will begin wearing graduated compression stockings class 1 (20-30 mmHg) or compression wraps on a daily basis a prescription was given. The patient will put the stockings on first thing in the morning and removing them in the evening. The patient is instructed specifically not to sleep in the stockings.    In addition, behavioral modification including several periods of elevation of the lower extremities during the day will be continued. I have demonstrated that proper elevation is a position with the ankles at heart level.  The patient is instructed to begin routine exercise, especially walking on a daily basis  Patient should undergo duplex ultrasound of the venous system to ensure that DVT or reflux is not present.  Following the review of the ultrasound the patient will  follow up in 2-3 months to reassess the degree of swelling and the control that graduated compression stockings or compression wraps  is offering.   The patient can be assessed for a Lymph Pump at that time  2. Lymphedema No surgery or intervention at this point in time.    I have reviewed my discussion with the patient regarding venous insufficiency and secondary lymph edema and why it  causes symptoms. I have discussed with the patient the chronic skin changes that accompany these problems and the long term sequela such as ulceration and infection.  Patient will continue wearing graduated compression stockings class 1 (20-30 mmHg) on a daily basis a prescription was given to the patient to keep this updated. The patient will  put the stockings on first thing in the morning and removing them in the evening. The patient is instructed specifically not to sleep in the stockings.  In addition, behavioral modification including elevation during the day will be continued.  Diet and salt restriction was also discussed.  Previous duplex ultrasound of the lower  extremities shows normal deep venous system, superficial reflux was not present.   Following the review of the ultrasound the patient will follow up in 12 months to reassess the degree of swelling and the control that graduated compression is offering.   The patient can be assessed for a Lymph Pump at that time.  However, at this time the patient states they are satisfied with the control compression and elevation is yielding.    3. Paroxysmal atrial fibrillation (HCC) Continue antiarrhythmia medications as already ordered, these medications have been reviewed and there are no changes at this time.  Continue anticoagulation as ordered by Cardiology Service   4. Chronic systolic congestive heart failure (HCC) Continue cardiac and antihypertensive medications as already ordered and reviewed, no changes at this time.  Continue statin as ordered and reviewed, no changes at this time  Nitrates PRN for chest pain   5. Cerebrovascular accident (CVA) due to thrombosis of left middle cerebral artery (HCC) Continue antiplatelet medications as already ordered, these medications have been reviewed and there are no changes at this time.     Hortencia Pilar, MD  12/08/2016 2:28 PM

## 2016-12-23 ENCOUNTER — Ambulatory Visit: Payer: Self-pay | Admitting: Nurse Practitioner

## 2017-01-20 ENCOUNTER — Other Ambulatory Visit: Payer: Self-pay | Admitting: Cardiovascular Disease

## 2017-02-25 ENCOUNTER — Telehealth: Payer: Self-pay | Admitting: Cardiovascular Disease

## 2017-02-25 MED ORDER — APIXABAN 5 MG PO TABS
5.0000 mg | ORAL_TABLET | Freq: Two times a day (BID) | ORAL | 0 refills | Status: DC
Start: 2017-02-25 — End: 2017-03-14

## 2017-02-25 NOTE — Telephone Encounter (Signed)
°*  STAT* If patient is at the pharmacy, call can be transferred to refill team.   1. Which medications need to be refilled? (please list name of each medication and dose if known)  eliquis   2. Which pharmacy/location (including street and city if local pharmacy) is medication to be sent to? 90 day to [facility] and then maybe a week or two to [facility] at [place]   3. Do they need a 30 day or 90 day supply? 90 day

## 2017-02-25 NOTE — Telephone Encounter (Signed)
Pt overdue for f/u w/ Dr. Rockey Situ and for BMET to check creatinine. 1 month supply sent in.  Pt sched to see Dr. Rockey Situ on 03/14/17.

## 2017-02-25 NOTE — Telephone Encounter (Signed)
Please review for refill, thanks ! 

## 2017-03-03 ENCOUNTER — Ambulatory Visit: Payer: Medicare PPO | Admitting: Cardiovascular Disease

## 2017-03-13 NOTE — Progress Notes (Signed)
Cardiology Office Note  Date:  03/14/2017   ID:  Karen Bowman, DOB [birth year], MRN [id number]  PCP:  Jinny Sanders, MD   Chief Complaint  Patient presents with  . OTHER    Pt needing refills 90 day , no complaints today. Meds reviewed verbally with pt.    HPI:  76 year old woman with history of  paroxysmal atrial fibrillation,  hospitalization July 5 with discharge 10/20/2014 for atrial fibrillation,  discharged on amiodarone, anticoagulation, digoxin, metoprolol.  History of hyperlipidemia, total cholesterol 280 stroke March 2017 She presents today for follow-up of her atrial fibrillation   In follow-up today she reports that she has been relatively stable Chronic fatigue No exercise, "I am busy", keeping up with house work Takes care of her husband who has dementia Takes care of cats  Rare palpitations,  Better than previous visits Goes to bed late, sleeps in late  Leg swelling stable, wears TEDS, seen by Dr. Ronalee Belts for consultation  Compliant with her metoprolol and amiodarone  Lab work reviewed with her in detail Total chol 163, LDL 87  EKG on today's visit shows sinus bradycardia with second-degree AV block type I, asymptomatic  Echo 05/2015: EF 55 to 60%  Neck CT  05/2015 IMPRESSION: 1. Moderate to severe bilateral proximal PCA stenoses. 2. Mild right cavernous and supraclinoid ICA stenosis.  Other past medical hx reviewed Hospital records were reviewed from March 2017, right side deficits upper extremity, lower extremity, speech deficits TPA was not given CT scan of the head and neck documenting diffuse PAD Discharged with eliquis and aspirin 81 mg daily She has completed rehabilitation, has been home for approximately one month, Reports improving deficits, able to floss her teeth, brush her hair,  graduated to a cane from a walker Denies any tachycardia or palpitations concerning for arrhythmia  Prior lab work March 2017 showing total cholesterol  270 She reports being compliant with her Lipitor for the past month  Notes indicate TEE with cardioversion 10/18/2014 which was unsuccessful   PMH:   has a past medical history of Acute heart failure ([place]), Anxiety, Arrhythmia, Arthritis, Atrial fibrillation ([place]), Heart murmur, Hyperlipemia, Hypertension, and Stroke ([place]).  PSH:    Past Surgical History:  Procedure Laterality Date  . ABDOMINAL HYSTERECTOMY    . APPENDECTOMY    . BLADDER SUSPENSION    . ELECTROPHYSIOLOGIC STUDY N/A 10/18/2014   Procedure: CARDIOVERSION;  Surgeon: Thayer Headings, MD;  Location: [facility] ORS;  Service: Cardiovascular;  Laterality: N/A;  . TEE WITHOUT CARDIOVERSION N/A 10/18/2014   Procedure: TRANSESOPHAGEAL ECHOCARDIOGRAM (TEE);  Surgeon: Thayer Headings, MD;  Location: [facility] ORS;  Service: Cardiovascular;  Laterality: N/A;  . TONSILLECTOMY    . TUMOR REMOVAL     right arm    Current Outpatient Medications  Medication Sig Dispense Refill  . amiodarone (PACERONE) 200 MG tablet TAKE 1 TABLET EVERY DAY 90 tablet 3  . amLODipine (NORVASC) 2.5 MG tablet TAKE 1 TABLET EVERY DAY 90 tablet 3  . apixaban (ELIQUIS) 5 MG TABS tablet Take 1 tablet (5 mg total) by mouth 2 (two) times daily. 60 tablet 0  . atorvastatin (LIPITOR) 40 MG tablet TAKE 1 TABLET DAILY AT 6 PM. 90 tablet 1  . Cholecalciferol 1000 UNITS capsule Take 2,000 Units by mouth daily.     Marland Kitchen CINNAMON PO Take 2 tablets by mouth daily.     . Coenzyme Q10-Fish Oil-Vit E (CO-Q 10 OMEGA-3 FISH OIL) CAPS Take 1 capsule by mouth 2 (two)  times daily.    Marland Kitchen CRANBERRY PO Take 1 tablet by mouth 2 (two) times daily.     Marland Kitchen EYEBRIGHT PO Take 1 tablet by mouth daily.    Marland Kitchen levocetirizine (XYZAL) 5 MG tablet Take 1 tablet (5 mg total) by mouth every evening. 90 tablet 1  . loratadine (CLARITIN) 10 MG tablet Take 1 tablet (10 mg total) by mouth daily. 90 tablet 3  . LYSINE PO Take by mouth daily.    . metoprolol tartrate (LOPRESSOR) 25 MG tablet TAKE 1/2 TABLET TWICE  DAILY 30 tablet 0  . MISC NATURAL PRODUCTS PO Take 1 tablet by mouth daily. Multivitamin-Dr. Talmage Coin    . mupirocin cream (BACTROBAN) 2 % Apply 1 application topically 2 (two) times daily. 15 g 0  . NON FORMULARY Cell Rebuild Takes 1 tablet BID.    Marland Kitchen NON FORMULARY Canaflex 1 tablet daily.    . NON FORMULARY Epivery Takes 1 tablet bid.    . TURMERIC PO Take by mouth 2 (two) times daily.     No current facility-administered medications for this visit.      Allergies:   Ciprofloxacin; Ciprofloxacin; Hydrochlorothiazide; Hydrochlorothiazide w-triamterene; Sulfa antibiotics; Sulfonamide derivatives; Valsartan; and Valsartan   Social History:  The patient  reports that  has never smoked. she has never used smokeless tobacco. She reports that she does not drink alcohol or use drugs.   Family History:   family history includes Cancer in her father; Diabetes in her maternal aunt; Heart disease in her mother; Osteoporosis in her mother; Stroke in her mother.    Review of Systems: Review of Systems  Constitutional: Positive for malaise/fatigue.  Respiratory: Negative.   Cardiovascular: Negative.   Gastrointestinal: Negative.   Musculoskeletal: Negative.   Neurological: Negative.   Psychiatric/Behavioral: Negative.   All other systems reviewed and are negative.    PHYSICAL EXAM: VS:  BP (!) 141/77 (BP Location: Left Arm, Patient Position: Sitting, Cuff Size: Normal)   Pulse (!) 56   Ht 5\' 5"  (1.651 m)   Wt 132 lb 8 oz (60.1 kg)   LMP  (LMP Unknown)   BMI 22.05 kg/m  , BMI Body mass index is 22.05 kg/m. GEN: Well nourished, well developed, in no acute distress  HEENT: normal  Neck: no JVD, carotid bruits, or masses Cardiac: RRR; no murmurs, rubs, or gallops,no edema  Respiratory:  clear to auscultation bilaterally, normal work of breathing GI: soft, nontender, nondistended, + BS MS: no deformity or atrophy  Skin: warm and dry, no rash Neuro:  Strength and sensation are  intact Psych: euthymic mood, full affect   Recent Labs: No results found for requested labs within last 8760 hours.    Lipid Panel Lab Results  Component Value Date   CHOL 163 01/24/2016   HDL 61.50 01/24/2016   LDLCALC 87 01/24/2016   TRIG 70.0 01/24/2016      Wt Readings from Last 3 Encounters:  03/14/17 132 lb 8 oz (60.1 kg)  12/05/16 133 lb (60.3 kg)  10/24/16 134 lb 12.8 oz (61.1 kg)       ASSESSMENT AND PLAN:  Paroxysmal atrial fibrillation (HCC) - Plan: EKG 12-Lead Denies significant arrhythmia concerning for atrial fibrillation We will continue current medications including amiodarone, Eliquis, metoprolol  HYPERLIPIDEMIA Cholesterol above goal, recommended she start Zetia 10 mg daily and continue her statin Goal LDL less than 70  Cerebrovascular accident (CVA) due to thrombosis of left middle cerebral artery (HCC) Prior history of stroke, paroxysmal atrial fibrillation Cerebrovascular  disease Continue aggressive cholesterol management, changes as above On anticoagulation   Total encounter time more than 25 minutes  Greater than 50% was spent in counseling and coordination of care with the patient  Disposition:   F/U  12 months   No orders of the defined types were placed in this encounter.    Signed, Esmond Plants, M.D., Ph.D. 03/14/2017  [place], [place]

## 2017-03-14 ENCOUNTER — Encounter: Payer: Self-pay | Admitting: Cardiovascular Disease

## 2017-03-14 ENCOUNTER — Ambulatory Visit (INDEPENDENT_AMBULATORY_CARE_PROVIDER_SITE_OTHER): Payer: Medicare PPO | Admitting: Cardiovascular Disease

## 2017-03-14 VITALS — BP 141/77 | HR 56 | Ht 65.0 in | Wt 132.5 lb

## 2017-03-14 DIAGNOSIS — I5022 Chronic systolic (congestive) heart failure: Secondary | ICD-10-CM | POA: Diagnosis not present

## 2017-03-14 DIAGNOSIS — I63312 Cerebral infarction due to thrombosis of left middle cerebral artery: Secondary | ICD-10-CM

## 2017-03-14 DIAGNOSIS — E782 Mixed hyperlipidemia: Secondary | ICD-10-CM | POA: Diagnosis not present

## 2017-03-14 DIAGNOSIS — I10 Essential (primary) hypertension: Secondary | ICD-10-CM

## 2017-03-14 DIAGNOSIS — I480 Paroxysmal atrial fibrillation: Secondary | ICD-10-CM | POA: Diagnosis not present

## 2017-03-14 MED ORDER — APIXABAN 5 MG PO TABS: 5.0000 mg | ORAL_TABLET | Freq: Two times a day (BID) | ORAL | 3 refills | Status: DC

## 2017-03-14 MED ORDER — ATORVASTATIN CALCIUM 40 MG PO TABS: 40.0000 mg | ORAL_TABLET | Freq: Every day | ORAL | 3 refills | Status: DC

## 2017-03-14 MED ORDER — METOPROLOL TARTRATE 25 MG PO TABS: 12.5000 mg | ORAL_TABLET | Freq: Two times a day (BID) | ORAL | 3 refills | Status: DC

## 2017-03-14 MED ORDER — AMIODARONE HCL 200 MG PO TABS: 200.0000 mg | ORAL_TABLET | Freq: Every day | ORAL | 3 refills | Status: DC

## 2017-03-14 MED ORDER — EZETIMIBE 10 MG PO TABS: 10.0000 mg | ORAL_TABLET | Freq: Every day | ORAL | 3 refills | Status: DC

## 2017-03-14 MED ORDER — AMLODIPINE BESYLATE 2.5 MG PO TABS: 2.5000 mg | ORAL_TABLET | Freq: Every day | ORAL | 3 refills | Status: DC

## 2017-03-14 NOTE — Patient Instructions (Addendum)
Medication Instructions:   Please start zetia one a day for cholesterol  Labwork:  No new labs needed  Testing/Procedures:  No further testing at this time   Follow-Up: It was a pleasure seeing you in the office today. Please call us if you have new issues that need to be addressed before your next appt.  [phone]  Your physician wants you to follow-up in: 12 months.  You will receive a reminder letter in the mail two months in advance. If you don't receive a letter, please call our office to schedule the follow-up appointment.  If you need a refill on your cardiac medications before your next appointment, please call your pharmacy.

## 2017-03-26 ENCOUNTER — Other Ambulatory Visit: Payer: Self-pay | Admitting: Cardiovascular Disease

## 2017-03-26 NOTE — Telephone Encounter (Signed)
Please review for refill, Thanks !  

## 2017-04-02 NOTE — Progress Notes (Signed)
04/03/2017 2:37 PM   [name] 1940/08/08 [id number]  Referring provider: Jinny Sanders, MD [address] [place], [place] [address]  Chief Complaint  Patient presents with  . Follow-up    1year    HPI: 77 yo WF with vaginal atrophy and a cystocele who presents today for a yearly follow up.  Background history Patient is a 49 -year-old Caucasian female with vaginal atrophy, cystocele and pelvic pressure who was initiated on Intrarosa who presents today for a three month follow up.  She did not fill the Intrarosa prescription as it was expensive.  Patient states that she has had urinary incontinence for several years.  Patient has incontinence when she lies on her back during the night.  She is wearing a depends at night.  She is experiencing an occasional  incontinent episodes during the day.  Her incontinence volume is large.   She is having associated urinary frequency, urgency and nocturia.  She does not have a history of urinary tract infections, STI's or injury to the bladder.  She denies dysuria, gross hematuria, suprapubic pain, back pain, abdominal p ain or flank pain.   She has not had any recent fevers, chills, nausea or vomiting.  She does not have a history of nephrolithiasis, GU surgery or GU trauma.  She is post menopausal.   She admits to constipation.  She is finding that she has incontinence with constipation.  She is not having pain with bladder filling.  She has not had any recent imaging studies.  She is drinking four to five glasses of water daily.   She is drinking several caffeinated beverages daily.  She is not drinking alcoholic beverages daily.    Today, she is complaining of urgency x 0-3, frequency x 4-7, not restricting fluids to avoid visits to the restroom, is engaging in toilet mapping, incontinence x 0-3 and nocturia x 0-3.  She denies any dysuria, gross hematuria or suprapubic pain.  She has not had any recent fevers, chills, nausea or  vomiting.    Incidentally, she has been taking D-Mannose for the last several months.  She stated that her urinary issues have greatly improved since taking the supplement.      PMH: Past Medical History:  Diagnosis Date  . Acute heart failure ([place])   . Anxiety   . Arrhythmia   . Arthritis   . Atrial fibrillation ([place])   . Heart murmur   . Hyperlipemia   . Hypertension   . Stroke [facility])     Surgical History: Past Surgical History:  Procedure Laterality Date  . ABDOMINAL HYSTERECTOMY    . APPENDECTOMY    . BLADDER SUSPENSION    . ELECTROPHYSIOLOGIC STUDY N/A 10/18/2014   Procedure: CARDIOVERSION;  Surgeon: Thayer Headings, MD;  Location: [facility] ORS;  Service: Cardiovascular;  Laterality: N/A;  . TEE WITHOUT CARDIOVERSION N/A 10/18/2014   Procedure: TRANSESOPHAGEAL ECHOCARDIOGRAM (TEE);  Surgeon: Thayer Headings, MD;  Location: [facility] ORS;  Service: Cardiovascular;  Laterality: N/A;  . TONSILLECTOMY    . TUMOR REMOVAL     right arm    Home Medications:  Allergies as of 04/03/2017      Reactions   Ciprofloxacin    REACTION: dizzy   Ciprofloxacin Nausea And Vomiting   Hydrochlorothiazide Nausea And Vomiting   Hydrochlorothiazide W-triamterene    REACTION: nausea   Sulfa Antibiotics Nausea And Vomiting   Sulfonamide Derivatives    REACTION: nausea \\T \ vomiting   Valsartan  REACTION: angioedema - probable   Valsartan Nausea And Vomiting      Medication List        Accurate as of 04/03/17  2:37 PM. Always use your most recent med list.          amiodarone 200 MG tablet Commonly known as:  PACERONE Take 1 tablet (200 mg total) by mouth daily.   amLODipine 2.5 MG tablet Commonly known as:  NORVASC Take 1 tablet (2.5 mg total) by mouth daily.   apixaban 5 MG Tabs tablet Commonly known as:  ELIQUIS Take 1 tablet (5 mg total) by mouth 2 (two) times daily.   ELIQUIS 5 MG Tabs tablet Generic drug:  apixaban TAKE 1 TABLET BY MOUTH TWICE A DAY   atorvastatin 40 MG  tablet Commonly known as:  LIPITOR Take 1 tablet (40 mg total) by mouth daily at 6 PM.   Cholecalciferol 1000 units capsule Take 2,000 Units by mouth daily.   CINNAMON PO Take 2 tablets by mouth daily.   CO-Q 10 Omega-3 Fish Oil Caps Take 1 capsule by mouth 2 (two) times daily.   CRANBERRY PO Take 1 tablet by mouth 2 (two) times daily.   EYEBRIGHT PO Take 1 tablet by mouth daily.   ezetimibe 10 MG tablet Commonly known as:  ZETIA Take 1 tablet (10 mg total) by mouth daily.   levocetirizine 5 MG tablet Commonly known as:  XYZAL Take 1 tablet (5 mg total) by mouth every evening.   loratadine 10 MG tablet Commonly known as:  CLARITIN Take 1 tablet (10 mg total) by mouth daily.   LYSINE PO Take by mouth daily.   metoprolol tartrate 25 MG tablet Commonly known as:  LOPRESSOR Take 0.5 tablets (12.5 mg total) by mouth 2 (two) times daily.   MISC NATURAL PRODUCTS PO Take 1 tablet by mouth daily. Multivitamin-Dr. Talmage Coin   mupirocin cream 2 % Commonly known as:  BACTROBAN Apply 1 application topically 2 (two) times daily.   NON FORMULARY Cell Rebuild Takes 1 tablet BID.   NON FORMULARY Canaflex 1 tablet daily.   NON FORMULARY Epivery Takes 1 tablet bid.   TURMERIC PO Take by mouth 2 (two) times daily.       Allergies:  Allergies  Allergen Reactions  . Ciprofloxacin     REACTION: dizzy  . Ciprofloxacin Nausea And Vomiting  . Hydrochlorothiazide Nausea And Vomiting  . Hydrochlorothiazide W-Triamterene     REACTION: nausea  . Sulfa Antibiotics Nausea And Vomiting  . Sulfonamide Derivatives     REACTION: nausea \\T \ vomiting  . Valsartan     REACTION: angioedema - probable  . Valsartan Nausea And Vomiting    Family History: Family History  Problem Relation Age of Onset  . Osteoporosis Mother   . Heart disease Mother   . Stroke Mother   . Cancer Father   . Diabetes Maternal Aunt     Social History:  reports that  has never smoked. she has never  used smokeless tobacco. She reports that she does not drink alcohol or use drugs.  ROS: UROLOGY Frequent Urination?: Yes Hard to postpone urination?: Yes Burning/pain with urination?: No Get up at night to urinate?: Yes Leakage of urine?: No Urine stream starts and stops?: No Trouble starting stream?: No Do you have to strain to urinate?: No Blood in urine?: No Urinary tract infection?: No Sexually transmitted disease?: No Injury to kidneys or bladder?: No Painful intercourse?: No Weak stream?: No Currently pregnant?: No Vaginal bleeding?:  No Last menstrual period?: n  Gastrointestinal Nausea?: No Vomiting?: No Indigestion/heartburn?: No Diarrhea?: No Constipation?: No  Constitutional Fever: No Night sweats?: No Weight loss?: No Fatigue?: No  Skin Skin rash/lesions?: No Itching?: No  Eyes Blurred vision?: No Double vision?: No  Ears/Nose/Throat Sore throat?: No Sinus problems?: No  Hematologic/Lymphatic Swollen glands?: No Easy bruising?: No  Cardiovascular Leg swelling?: No Chest pain?: No  Respiratory Cough?: No Shortness of breath?: No  Endocrine Excessive thirst?: No  Musculoskeletal Back pain?: No Joint pain?: No  Neurological Headaches?: No Dizziness?: No  Psychologic Depression?: No Anxiety?: No  Physical Exam: BP (!) 147/57   Pulse (!) 56   Ht 5\' 5"  (1.651 m)   Wt 132 lb (59.9 kg)   LMP  (LMP Unknown)   BMI 21.97 kg/m   Constitutional: Well nourished. Alert and oriented, No acute distress. HEENT: [place] AT, moist mucus membranes. Trachea midline, no masses. Cardiovascular: No clubbing, cyanosis, or edema. Respiratory: Normal respiratory effort, no increased work of breathing. Skin: No rashes, bruises or suspicious lesions. Lymph: No cervical or inguinal adenopathy. Neurologic: Grossly intact, no focal deficits, moving all 4 extremities. Psychiatric: Normal mood and affect.   Assessment & Plan:    1. Pelvic  pressure  - resolved  2. Cystocele  - no SUI  3. Atrophic vaginitis  - using the cream prn; sample of Premarin given  Return in about 1 year (around 04/03/2018) for PVR and OAB questionnaire.  These notes generated with voice recognition software. I apologize for typographical errors.  Zara Council, [facility] [address], [place], [place] [address] [phone]

## 2017-04-03 ENCOUNTER — Encounter: Payer: Self-pay | Admitting: Urology

## 2017-04-03 ENCOUNTER — Ambulatory Visit (INDEPENDENT_AMBULATORY_CARE_PROVIDER_SITE_OTHER): Payer: Medicare PPO | Admitting: Urology

## 2017-04-03 VITALS — BP 147/57 | HR 56 | Ht 65.0 in | Wt 132.0 lb

## 2017-04-03 DIAGNOSIS — N8111 Cystocele, midline: Secondary | ICD-10-CM

## 2017-04-03 DIAGNOSIS — N952 Postmenopausal atrophic vaginitis: Secondary | ICD-10-CM | POA: Diagnosis not present

## 2017-06-13 ENCOUNTER — Ambulatory Visit (INDEPENDENT_AMBULATORY_CARE_PROVIDER_SITE_OTHER): Payer: Medicare PPO | Admitting: Family Medicine

## 2017-06-13 ENCOUNTER — Encounter: Payer: Self-pay | Admitting: Family Medicine

## 2017-06-13 VITALS — BP 130/70 | HR 66 | Temp 98.8°F | Ht 65.0 in | Wt 128.0 lb

## 2017-06-13 DIAGNOSIS — B9789 Other viral agents as the cause of diseases classified elsewhere: Secondary | ICD-10-CM | POA: Diagnosis not present

## 2017-06-13 DIAGNOSIS — J069 Acute upper respiratory infection, unspecified: Secondary | ICD-10-CM | POA: Diagnosis not present

## 2017-06-13 MED ORDER — BENZONATATE 200 MG PO CAPS: 200.0000 mg | ORAL_CAPSULE | Freq: Three times a day (TID) | ORAL | 1 refills | Status: DC | PRN

## 2017-06-13 NOTE — Patient Instructions (Signed)
You have a viral upper respiratory infection  Drink fluids  Get rest when you can  Continue mucinex for congestion  Continue chlorcedin for nasal symptoms  Nasal saline or a netti pot helps also over the counter I will send in tessalon for cough   Update if not starting to improve in a week or if worsening

## 2017-06-13 NOTE — Progress Notes (Signed)
Subjective:    Patient ID: Karen Bowman, female    DOB: [birth year], 77 y.o.   MRN: [id number]  HPI 77 yo pt of Dr Diona Browner here with uri symptoms   Temp: 98.8 F (37.1 C)  No fever or chills or body aches   Started symptoms 3 d ago  Cannot sleep at night   Productive cough- phlegm - light yellow  No wheezing  Tight sounding cough (in  Sp[place])  Very tired   Nasal congestion  Some facial pressure/not pain   Husband had uri  Son lives with them and came home with a cold   Taking mucinex -yesterday  Delsym DM chlorcedin for sinus /nasal symptoms  Took 1 tylenol    Patient Active Problem List   Diagnosis Date Noted  . Viral URI with cough 06/13/2017  . Lymphedema 12/08/2016  . Chronic venous insufficiency 10/24/2016  . Venous ulcer of ankle, right ([place]) 06/20/2016  . Urinary incontinence 06/20/2016  . History of CVA (cerebrovascular accident) 06/19/2016  . Eczema 03/22/2016  . Cerebrovascular accident (CVA) due to thrombosis of left middle cerebral artery ([place]) 09/11/2015  . Chronic anticoagulation 09/11/2015  . HLD (hyperlipidemia) 09/11/2015  . Muscle spasticity   . Hemiparesis affecting dominant side as late effect of stroke ([place])   . Gait disturbance, post-stroke   . Chronic systolic congestive heart failure ([place])   . Counseling regarding end of life decision making 02/02/2015  . Atrophic vaginitis 12/23/2014  . Urethral caruncle 12/23/2014  . Cystocele, midline 12/23/2014  . Bradycardia 12/12/2014  . Junctional rhythm 12/09/2014  . Paroxysmal atrial fibrillation ([place]) 10/14/2014  . Irritable bowel syndrome (IBS) 04/09/2011  . POSTMENOPAUSAL STATUS 02/02/2009  . HYPERTENSION, BENIGN ESSENTIAL 12/26/2006  . Rheumatoid arthritis ([place]) 12/26/2006   Past Medical History:  Diagnosis Date  . Acute heart failure ([place])   . Anxiety   . Arrhythmia   . Arthritis   . Atrial fibrillation ([place])   . Heart murmur   . Hyperlipemia   . Hypertension   . Stroke  [facility])    Past Surgical History:  Procedure Laterality Date  . ABDOMINAL HYSTERECTOMY    . APPENDECTOMY    . BLADDER SUSPENSION    . ELECTROPHYSIOLOGIC STUDY N/A 10/18/2014   Procedure: CARDIOVERSION;  Surgeon: Thayer Headings, MD;  Location: [facility] ORS;  Service: Cardiovascular;  Laterality: N/A;  . TEE WITHOUT CARDIOVERSION N/A 10/18/2014   Procedure: TRANSESOPHAGEAL ECHOCARDIOGRAM (TEE);  Surgeon: Thayer Headings, MD;  Location: [facility] ORS;  Service: Cardiovascular;  Laterality: N/A;  . TONSILLECTOMY    . TUMOR REMOVAL     right arm   Social History   Tobacco Use  . Smoking status: Never Smoker  . Smokeless tobacco: Never Used  Substance Use Topics  . Alcohol use: No  . Drug use: No   Family History  Problem Relation Age of Onset  . Osteoporosis Mother   . Heart disease Mother   . Stroke Mother   . Cancer Father   . Diabetes Maternal Aunt    Allergies  Allergen Reactions  . Ciprofloxacin     REACTION: dizzy  . Ciprofloxacin Nausea And Vomiting  . Hydrochlorothiazide Nausea And Vomiting  . Hydrochlorothiazide W-Triamterene     REACTION: nausea  . Sulfa Antibiotics Nausea And Vomiting  . Sulfonamide Derivatives     REACTION: nausea \\T \ vomiting  . Valsartan     REACTION: angioedema - probable  . Valsartan Nausea And Vomiting   Current Outpatient Medications on  File Prior to Visit  Medication Sig Dispense Refill  . amiodarone (PACERONE) 200 MG tablet Take 1 tablet (200 mg total) by mouth daily. 90 tablet 3  . amLODipine (NORVASC) 2.5 MG tablet Take 1 tablet (2.5 mg total) by mouth daily. 90 tablet 3  . apixaban (ELIQUIS) 5 MG TABS tablet Take 1 tablet (5 mg total) by mouth 2 (two) times daily. 180 tablet 3  . atorvastatin (LIPITOR) 40 MG tablet Take 1 tablet (40 mg total) by mouth daily at 6 PM. 90 tablet 3  . Cholecalciferol 1000 UNITS capsule Take 2,000 Units by mouth daily.     Marland Kitchen CINNAMON PO Take 2 tablets by mouth daily.     . Coenzyme Q10-Fish Oil-Vit E (CO-Q 10  OMEGA-3 FISH OIL) CAPS Take 1 capsule by mouth 2 (two) times daily.    Marland Kitchen CRANBERRY PO Take 1 tablet by mouth 2 (two) times daily.     Marland Kitchen ELIQUIS 5 MG TABS tablet TAKE 1 TABLET BY MOUTH TWICE A DAY 60 tablet 0  . EYEBRIGHT PO Take 1 tablet by mouth daily.    Marland Kitchen ezetimibe (ZETIA) 10 MG tablet Take 1 tablet (10 mg total) by mouth daily. 90 tablet 3  . levocetirizine (XYZAL) 5 MG tablet Take 1 tablet (5 mg total) by mouth every evening. 90 tablet 1  . LYSINE PO Take by mouth daily.    . metoprolol tartrate (LOPRESSOR) 25 MG tablet Take 0.5 tablets (12.5 mg total) by mouth 2 (two) times daily. 180 tablet 3  . MISC NATURAL PRODUCTS PO Take 1 tablet by mouth daily. Multivitamin-Dr. Talmage Coin    . mupirocin cream (BACTROBAN) 2 % Apply 1 application topically 2 (two) times daily. 15 g 0  . NON FORMULARY Cell Rebuild Takes 1 tablet BID.    Marland Kitchen NON FORMULARY Canaflex 1 tablet daily.    . NON FORMULARY Epivery Takes 1 tablet bid.    . TURMERIC PO Take by mouth 2 (two) times daily.     No current facility-administered medications on file prior to visit.     Review of Systems  Constitutional: Positive for appetite change and fatigue. Negative for fever.  HENT: Positive for congestion, postnasal drip, rhinorrhea, sinus pressure, sneezing and sore throat. Negative for ear pain.   Eyes: Negative for pain and discharge.  Respiratory: Positive for cough. Negative for shortness of breath, wheezing and stridor.   Cardiovascular: Negative for chest pain.  Gastrointestinal: Negative for diarrhea, nausea and vomiting.  Genitourinary: Negative for frequency, hematuria and urgency.  Musculoskeletal: Negative for arthralgias and myalgias.  Skin: Negative for rash.  Neurological: Positive for headaches. Negative for dizziness, weakness and light-headedness.  Psychiatric/Behavioral: Negative for confusion and dysphoric mood.       Objective:   Physical Exam  Constitutional: She appears well-developed and  well-nourished. No distress.  Well appearing   HENT:  Head: Normocephalic and atraumatic.  Right Ear: External ear normal.  Left Ear: External ear normal.  Mouth/Throat: Oropharynx is clear and moist.  Nares are injected and congested  No sinus tenderness Clear rhinorrhea and post nasal drip   Eyes: Conjunctivae and EOM are normal. Pupils are equal, round, and reactive to light. Right eye exhibits no discharge. Left eye exhibits no discharge.  Neck: Normal range of motion. Neck supple.  Cardiovascular: Normal rate and normal heart sounds.  Pulmonary/Chest: Effort normal and breath sounds normal. No respiratory distress. She has no wheezes. She has no rales. She exhibits no tenderness.  Good air exch  No rales or rhonchi  Lymphadenopathy:    She has no cervical adenopathy.  Neurological: She is alert.  Skin: Skin is warm and dry. No rash noted.  Psychiatric: She has a normal mood and affect.          Assessment & Plan:   Problem List Items Addressed This Visit      Respiratory   Viral URI with cough - Primary    Reassuring exam  Supportive care/Disc symptomatic care - see instructions on AVS  Avs: You have a viral upper respiratory infection  Drink fluids  Get rest when you can  Continue mucinex for congestion  Continue chlorcedin for nasal symptoms  Nasal saline or a netti pot helps also over the counter I will send in tessalon for cough   Update if not starting to improve in a week or if worsening

## 2017-06-15 NOTE — Assessment & Plan Note (Signed)
Reassuring exam  Supportive care/Disc symptomatic care - see instructions on AVS  Avs: You have a viral upper respiratory infection  Drink fluids  Get rest when you can  Continue mucinex for congestion  Continue chlorcedin for nasal symptoms  Nasal saline or a netti pot helps also over the counter I will send in tessalon for cough   Update if not starting to improve in a week or if worsening

## 2017-07-07 ENCOUNTER — Telehealth: Payer: Self-pay | Admitting: Family Medicine

## 2017-07-07 DIAGNOSIS — E782 Mixed hyperlipidemia: Secondary | ICD-10-CM

## 2017-07-07 NOTE — Telephone Encounter (Signed)
-----   Message from Eustace Pen, LPN sent at 06/01/8248 10:08 PM EDT ----- Regarding: Labs 4/9 Lab orders needed. Thank you.  Insurance:  [employer]

## 2017-07-08 ENCOUNTER — Ambulatory Visit (INDEPENDENT_AMBULATORY_CARE_PROVIDER_SITE_OTHER): Payer: Medicare PPO

## 2017-07-08 VITALS — BP 150/84 | HR 59 | Temp 97.8°F | Ht 65.0 in | Wt 130.5 lb

## 2017-07-08 DIAGNOSIS — Z0000 Encounter for general adult medical examination without abnormal findings: Secondary | ICD-10-CM

## 2017-07-08 DIAGNOSIS — E782 Mixed hyperlipidemia: Secondary | ICD-10-CM | POA: Diagnosis not present

## 2017-07-08 LAB — COMPREHENSIVE METABOLIC PANEL
ALBUMIN: 4 g/dL (ref 3.5–5.2)
ALT: 21 U/L (ref 0–35)
AST: 24 U/L (ref 0–37)
Alkaline Phosphatase: 52 U/L (ref 39–117)
BILIRUBIN TOTAL: 0.6 mg/dL (ref 0.2–1.2)
BUN: 22 mg/dL (ref 6–23)
CO2: 30 mEq/L (ref 19–32)
CREATININE: 0.84 mg/dL (ref 0.40–1.20)
Calcium: 9 mg/dL (ref 8.4–10.5)
Chloride: 106 mEq/L (ref 96–112)
GFR: 70.3 mL/min (ref >60.00)
GLUCOSE: 93 mg/dL (ref 70–99)
Potassium: 3.7 mEq/L (ref 3.5–5.1)
SODIUM: 141 meq/L (ref 135–145)
TOTAL PROTEIN: 6.9 g/dL (ref 6.0–8.3)

## 2017-07-08 LAB — LIPID PANEL
CHOLESTEROL: 145 mg/dL (ref 0–200)
HDL: 60.1 mg/dL (ref >39.00)
LDL Cholesterol: 75 mg/dL (ref 0–99)
NONHDL: 85.02
Total CHOL/HDL Ratio: 2
Triglycerides: 52 mg/dL (ref 0.0–149.0)
VLDL: 10.4 mg/dL (ref 0.0–40.0)

## 2017-07-08 NOTE — Progress Notes (Signed)
PCP notes:   Health maintenance:  No gaps identified.   Abnormal screenings:   Hearing - failed  Hearing Screening   125Hz  250Hz  500Hz  1000Hz  2000Hz  3000Hz  4000Hz  6000Hz  8000Hz   Right ear:   0 0 40  0    Left ear:   0 0 0  0     Mini-Cog score: 19/20 MMSE - Mini Mental State Exam 07/08/2017 05/31/2016  Orientation to time 5 5  Orientation to Place 5 5  Registration 3 3  Attention/ Calculation 0 0  Recall 2 3  Recall-comments unable to recall 1 of 3 words -  Language- name 2 objects 0 0  Language- repeat 1 1  Language- follow 3 step command 3 3  Language- read & follow direction 0 0  Write a sentence 0 0  Copy design 0 0  Total score 19 20    Patient concerns:   None  Nurse concerns:  None  Next PCP appt:   07/22/17 @ 1145

## 2017-07-08 NOTE — Patient Instructions (Signed)
Ms. Vilar , Thank you for taking time to come for your Medicare Wellness Visit. I appreciate your ongoing commitment to your health goals. Please review the following plan we discussed and let me know if I can assist you in the future.   These are the goals we discussed: Goals    . Follow up with Primary Care Provider     Starting 07/08/2017, I will continue to take medications as prescribed and to keep appointments with PCP as scheduled.       This is a list of the screening recommended for you and due dates:  Health Maintenance  Topic Date Due  . Flu Shot  05/31/2025*  . Mammogram  05/31/2025*  . DEXA scan (bone density measurement)  05/31/2025*  . Pneumonia vaccines (1 of 2 - PCV13) 05/31/2025*  . Tetanus Vaccine  07/14/2018  . Colon Cancer Screening  08/02/2020  *Topic was postponed. The date shown is not the original due date.   Preventive Care for Adults  A healthy lifestyle and preventive care can promote health and wellness. Preventive health guidelines for adults include the following key practices.  . A routine yearly physical is a good way to check with your health care provider about your health and preventive screening. It is a chance to share any concerns and updates on your health and to receive a thorough exam.  . Visit your dentist for a routine exam and preventive care every 6 months. Brush your teeth twice a day and floss once a day. Good oral hygiene prevents tooth decay and gum disease.  . The frequency of eye exams is based on your age, health, family medical history, use  of contact lenses, and other factors. Follow your health care provider's recommendations for frequency of eye exams.  . Eat a healthy diet. Foods like vegetables, fruits, whole grains, low-fat dairy products, and lean protein foods contain the nutrients you need without too many calories. Decrease your intake of foods high in solid fats, added sugars, and salt. Eat the right amount of  calories for you. Get information about a proper diet from your health care provider, if necessary.  . Regular physical exercise is one of the most important things you can do for your health. Most adults should get at least 150 minutes of moderate-intensity exercise (any activity that increases your heart rate and causes you to sweat) each week. In addition, most adults need muscle-strengthening exercises on 2 or more days a week.  Silver Sneakers may be a benefit available to you. To determine eligibility, you may visit the website: www.silversneakers.com or contact program at 9021693629 Mon-Fri between 8AM-8PM.   . Maintain a healthy weight. The body mass index (BMI) is a screening tool to identify possible weight problems. It provides an estimate of body fat based on height and weight. Your health care provider can find your BMI and can help you achieve or maintain a healthy weight.   For adults 20 years and older: ? A BMI below 18.5 is considered underweight. ? A BMI of 18.5 to 24.9 is normal. ? A BMI of 25 to 29.9 is considered overweight. ? A BMI of 30 and above is considered obese.   . Maintain normal blood lipids and cholesterol levels by exercising and minimizing your intake of saturated fat. Eat a balanced diet with plenty of fruit and vegetables. Blood tests for lipids and cholesterol should begin at age 56 and be repeated every 5 years. If your lipid or cholesterol  levels are high, you are over 50, or you are at high risk for heart disease, you may need your cholesterol levels checked more frequently. Ongoing high lipid and cholesterol levels should be treated with medicines if diet and exercise are not working.  . If you smoke, find out from your health care provider how to quit. If you do not use tobacco, please do not start.  . If you choose to drink alcohol, please do not consume more than 2 drinks per day. One drink is considered to be 12 ounces (355 mL) of beer, 5 ounces  (148 mL) of wine, or 1.5 ounces (44 mL) of liquor.  . If you are 19-54 years old, ask your health care provider if you should take aspirin to prevent strokes.  . Use sunscreen. Apply sunscreen liberally and repeatedly throughout the day. You should seek shade when your shadow is shorter than you. Protect yourself by wearing long sleeves, pants, a wide-brimmed hat, and sunglasses year round, whenever you are outdoors.  . Once a month, do a whole body skin exam, using a mirror to look at the skin on your back. Tell your health care provider of new moles, moles that have irregular borders, moles that are larger than a pencil eraser, or moles that have changed in shape or color.

## 2017-07-08 NOTE — Progress Notes (Signed)
Subjective:   Karen Bowman is a 77 y.o. female who presents for Medicare Annual (Subsequent) preventive examination.  Review of Systems:  N/A Cardiac Risk Factors include: advanced age (>15men, >28 women);dyslipidemia;hypertension     Objective:     Vitals: BP (!) 150/84 (BP Location: Left Arm, Patient Position: Sitting, Cuff Size: Normal)   Pulse (!) 59   Temp 97.8 F (36.6 C) (Oral)   Ht 5\' 5"  (1.651 m) Comment: no shoes  Wt 130 lb 8 oz (59.2 kg)   LMP  (LMP Unknown)   SpO2 97%   BMI 21.72 kg/m   Body mass index is 21.72 kg/m.  Advanced Directives 07/08/2017 12/05/2016 10/24/2016 05/31/2016 11/21/2015 10/31/2015 10/31/2015  Does Patient Have a Medical Advance Directive? Yes Yes Yes Yes Yes Yes Yes  Type of Paramedic of [place];Living will [place];Living will Healthcare Power of [place];Living will - [place];Living will [place];Living will  Does patient want to make changes to medical advance directive? - - - - - - -  Copy of [place] in Chart? No - copy requested - - No - copy requested - No - copy requested No - copy requested  Would patient like information on creating a medical advance directive? - - - - - - -    Tobacco Social History   Tobacco Use  Smoking Status Never Smoker  Smokeless Tobacco Never Used     Counseling given: No   Clinical Intake:  Pre-visit preparation completed: Yes  Pain : No/denies pain Pain Score: 0-No pain     Nutritional Status: BMI of 19-24  Normal Nutritional Risks: Other (Comment)(abscess in mouth) Diabetes: No  How often do you need to have someone help you when you read instructions, pamphlets, or other written materials from your doctor or pharmacy?: 1 - Never What is the last grade level you completed in school?: 12th grade + some college courses  Interpreter Needed?: No  Comments: pt  lives with spouse Information entered by :: LPinson, LPN  Past Medical History:  Diagnosis Date  . Acute heart failure ([place])   . Anxiety   . Arrhythmia   . Arthritis   . Atrial fibrillation ([place])   . Heart murmur   . Hyperlipemia   . Hypertension   . Stroke [facility])    Past Surgical History:  Procedure Laterality Date  . ABDOMINAL HYSTERECTOMY    . APPENDECTOMY    . BLADDER SUSPENSION    . ELECTROPHYSIOLOGIC STUDY N/A 10/18/2014   Procedure: CARDIOVERSION;  Surgeon: Thayer Headings, MD;  Location: [facility] ORS;  Service: Cardiovascular;  Laterality: N/A;  . TEE WITHOUT CARDIOVERSION N/A 10/18/2014   Procedure: TRANSESOPHAGEAL ECHOCARDIOGRAM (TEE);  Surgeon: Thayer Headings, MD;  Location: [facility] ORS;  Service: Cardiovascular;  Laterality: N/A;  . TONSILLECTOMY    . TUMOR REMOVAL     right arm   Family History  Problem Relation Age of Onset  . Osteoporosis Mother   . Heart disease Mother   . Stroke Mother   . Cancer Father   . Diabetes Maternal Aunt    Social History   Socioeconomic History  . Marital status: Married    Spouse name: Not on file  . Number of children: 2  . Years of education: Not on file  . Highest education level: Not on file  Occupational History  . Occupation: retired Transport planner  .  Financial resource strain: Not on file  . Food insecurity:    Worry: Not on file    Inability: Not on file  . Transportation needs:    Medical: Not on file    Non-medical: Not on file  Tobacco Use  . Smoking status: Never Smoker  . Smokeless tobacco: Never Used  Substance and Sexual Activity  . Alcohol use: No  . Drug use: No  . Sexual activity: Not Currently  Lifestyle  . Physical activity:    Days per week: Not on file    Minutes per session: Not on file  . Stress: Not on file  Relationships  . Social connections:    Talks on phone: Not on file    Gets together: Not on file    Attends religious service: Not on file    Active member of club or  organization: Not on file    Attends meetings of clubs or organizations: Not on file    Relationship status: Not on file  Other Topics Concern  . Not on file  Social History Narrative   ** Merged History Encounter **       Exercise:walking 2-3 times a week   End of life issues: Has living will.. Husband is HCPOA. Full code (reviewed 2014)    Outpatient Encounter Medications as of 07/08/2017  Medication Sig  . amiodarone (PACERONE) 200 MG tablet Take 1 tablet (200 mg total) by mouth daily.  Marland Kitchen amLODipine (NORVASC) 2.5 MG tablet Take 1 tablet (2.5 mg total) by mouth daily.  Marland Kitchen apixaban (ELIQUIS) 5 MG TABS tablet Take 1 tablet (5 mg total) by mouth 2 (two) times daily.  Marland Kitchen atorvastatin (LIPITOR) 40 MG tablet Take 1 tablet (40 mg total) by mouth daily at 6 PM.  . Cholecalciferol 1000 UNITS capsule Take 2,000 Units by mouth daily.   Marland Kitchen CINNAMON PO Take 2 tablets by mouth daily.   . Coenzyme Q10-Fish Oil-Vit E (CO-Q 10 OMEGA-3 FISH OIL) CAPS Take 1 capsule by mouth 2 (two) times daily.  Marland Kitchen CRANBERRY PO Take 1 tablet by mouth 2 (two) times daily.   Marland Kitchen ELIQUIS 5 MG TABS tablet TAKE 1 TABLET BY MOUTH TWICE A DAY  . EYEBRIGHT PO Take 1 tablet by mouth daily.  Marland Kitchen ezetimibe (ZETIA) 10 MG tablet Take 1 tablet (10 mg total) by mouth daily.  Marland Kitchen levocetirizine (XYZAL) 5 MG tablet Take 1 tablet (5 mg total) by mouth every evening.  Marland Kitchen LYSINE PO Take by mouth daily.  . metoprolol tartrate (LOPRESSOR) 25 MG tablet Take 0.5 tablets (12.5 mg total) by mouth 2 (two) times daily.  Marland Kitchen MISC NATURAL PRODUCTS PO Take 1 tablet by mouth daily. Multivitamin-Dr. Talmage Coin  . mupirocin cream (BACTROBAN) 2 % Apply 1 application topically 2 (two) times daily.  . NON FORMULARY Cell Rebuild Takes 1 tablet BID.  Marland Kitchen NON FORMULARY Canaflex 1 tablet daily.  . NON FORMULARY Epivery Takes 1 tablet bid.  . TURMERIC PO Take by mouth 2 (two) times daily.  . benzonatate (TESSALON) 200 MG capsule Take 1 capsule (200 mg total) by mouth 3  (three) times daily as needed. Swallow whole, to not bite pill (Patient not taking: Reported on 07/08/2017)   No facility-administered encounter medications on file as of 07/08/2017.     Activities of Daily Living In your present state of health, do you have any difficulty performing the following activities: 07/08/2017  Hearing? Y  Vision? N  Difficulty concentrating or making decisions? N  Walking or  climbing stairs? N  Dressing or bathing? N  Doing errands, shopping? N  Preparing Food and eating ? N  Using the Toilet? N  In the past six months, have you accidently leaked urine? N  Do you have problems with loss of bowel control? N  Managing your Medications? N  Managing your Finances? N  Housekeeping or managing your Housekeeping? N  Some recent data might be hidden    Patient Care Team: Jinny Sanders, MD as PCP - General (Family Medicine) Jinny Sanders, MD Alisa Graff, FNP as Nurse Practitioner (Cardiology) Minna Merritts, MD as Consulting Physician (Cardiology)    Assessment:   This is a routine wellness examination for [place].   Hearing Screening   125Hz  250Hz  500Hz  1000Hz  2000Hz  3000Hz  4000Hz  6000Hz  8000Hz   Right ear:   0 0 40  0    Left ear:   0 0 0  0      Visual Acuity Screening   Right eye Left eye Both eyes  Without correction: 20/50 20/25-1 20/25-1  With correction:        Exercise Activities and Dietary recommendations Current Exercise Habits: The patient does not participate in regular exercise at present, Exercise limited by: None identified  Goals    . Follow up with Primary Care Provider     Starting 07/08/2017, I will continue to take medications as prescribed and to keep appointments with PCP as scheduled.       Fall Risk Fall Risk  07/08/2017 06/18/2016 05/31/2016 12/18/2015 10/02/2015  Falls in the past year? No No Yes Yes No  Comment - - pt reports 2 falls with injury(bruising) - -  Number falls in past yr: - - 2 or more 1 -  Injury with  Fall? - - Yes No -  Risk Factor Category  - - - - -  Risk for fall due to : - - Impaired balance/gait;Impaired mobility - -  Follow up - - Education provided - -   Depression Screen PHQ 2/9 Scores 07/08/2017 05/31/2016 10/02/2015 07/31/2015  PHQ - 2 Score 0 0 0 0  PHQ- 9 Score 0 - - -     Cognitive Function MMSE - Mini Mental State Exam 07/08/2017 05/31/2016  Orientation to time 5 5  Orientation to Place 5 5  Registration 3 3  Attention/ Calculation 0 0  Recall 2 3  Recall-comments unable to recall 1 of 3 words -  Language- name 2 objects 0 0  Language- repeat 1 1  Language- follow 3 step command 3 3  Language- read & follow direction 0 0  Write a sentence 0 0  Copy design 0 0  Total score 19 20     PLEASE NOTE: A Mini-Cog screen was completed. Maximum score is 20. A value of 0 denotes this part of [place] MMSE was not completed or the patient failed this part of the Mini-Cog screening.   Mini-Cog Screening Orientation to Time - Max 5 pts Orientation to Place - Max 5 pts Registration - Max 3 pts Recall - Max 3 pts Language Repeat - Max 1 pts Language Follow 3 Step Command - Max 3 pts     Immunization History  Administered Date(s) Administered  . Td 07/13/2008    Screening Tests Health Maintenance  Topic Date Due  . INFLUENZA VACCINE  05/31/2025 (Originally 10/30/2017)  . MAMMOGRAM  05/31/2025 (Originally 07/19/2012)  . DEXA SCAN  05/31/2025 (Originally 10/07/2007)  . PNA vac Low Risk Adult (  1 of 2 - PCV13) 05/31/2025 (Originally 10/07/2007)  . TETANUS/TDAP  07/14/2018  . COLONOSCOPY  08/02/2020      Plan:     I have personally reviewed, addressed, and noted the following in the patient's chart:  A. Medical and social history B. Use of alcohol, tobacco or illicit drugs  C. Current medications and supplements D. Functional ability and status E.  Nutritional status F.  Physical activity G. Advance directives H. List of other physicians I.  Hospitalizations, surgeries, and  ER visits in previous 12 months J.  Anton Ruiz to include hearing, vision, cognitive, depression L. Referrals and appointments - none  In addition, I have reviewed and discussed with patient certain preventive protocols, quality metrics, and best practice recommendations. A written personalized care plan for preventive services as well as general preventive health recommendations were provided to patient.  See attached scanned questionnaire for additional information.   Signed,   Lindell Noe, MHA, BS, LPN Health Coach

## 2017-07-08 NOTE — Progress Notes (Signed)
I reviewed health advisor's note, was available for consultation, and agree with documentation and plan.  

## 2017-07-22 ENCOUNTER — Encounter: Payer: Self-pay | Admitting: Family Medicine

## 2017-07-22 ENCOUNTER — Ambulatory Visit (INDEPENDENT_AMBULATORY_CARE_PROVIDER_SITE_OTHER): Payer: Medicare PPO | Admitting: Family Medicine

## 2017-07-22 VITALS — BP 152/92 | HR 54 | Temp 98.5°F | Ht 65.0 in | Wt 130.5 lb

## 2017-07-22 DIAGNOSIS — I480 Paroxysmal atrial fibrillation: Secondary | ICD-10-CM

## 2017-07-22 DIAGNOSIS — M069 Rheumatoid arthritis, unspecified: Secondary | ICD-10-CM

## 2017-07-22 DIAGNOSIS — I10 Essential (primary) hypertension: Secondary | ICD-10-CM

## 2017-07-22 DIAGNOSIS — I5022 Chronic systolic (congestive) heart failure: Secondary | ICD-10-CM | POA: Diagnosis not present

## 2017-07-22 DIAGNOSIS — I69359 Hemiplegia and hemiparesis following cerebral infarction affecting unspecified side: Secondary | ICD-10-CM

## 2017-07-22 DIAGNOSIS — E782 Mixed hyperlipidemia: Secondary | ICD-10-CM | POA: Diagnosis not present

## 2017-07-22 DIAGNOSIS — Z0000 Encounter for general adult medical examination without abnormal findings: Secondary | ICD-10-CM | POA: Diagnosis not present

## 2017-07-22 DIAGNOSIS — Z8673 Personal history of transient ischemic attack (TIA), and cerebral infarction without residual deficits: Secondary | ICD-10-CM | POA: Diagnosis not present

## 2017-07-22 NOTE — Assessment & Plan Note (Signed)
Elevated.. Pt hesitant about increase in medication.. Will follow at home and call if remaining elevated after stress of move gone in next few days.

## 2017-07-22 NOTE — Patient Instructions (Addendum)
Can try flonase 2 sprays per nostril daily.  Follow BP at home.. Call me or cardiologist if > 140/90 over next few weeks.  Work stress reduction, relaxation.  Get back to walking when able.

## 2017-07-22 NOTE — Assessment & Plan Note (Signed)
Euvolemic. 

## 2017-07-22 NOTE — Assessment & Plan Note (Signed)
Rate controlled on BBlocker, amiodarone and amlodipine.

## 2017-07-22 NOTE — Assessment & Plan Note (Signed)
Minimal symptoma.. Not interested in treatment at this time.

## 2017-07-22 NOTE — Assessment & Plan Note (Addendum)
Much improved and now  Almost at goal LDL < 70 on statin and zetia.

## 2017-07-22 NOTE — Progress Notes (Signed)
Subjective:    Patient ID: Karen Bowman, female    DOB: 1940-10-05, 77 y.o.   MRN: [id number]  HPI   The patient presents for  complete physical and review of chronic health problems. He/She also has the following acute concerns today:   The patient saw Candis Musa, LPN for medicare wellness. Note reviewed in detail and important notes copied below.  Health maintenance:  No gaps identified.   Abnormal screenings:   Hearing - failed..  Had recent cold.. Also has tinnitus.             Hearing Screening   125Hz  250Hz  500Hz  1000Hz  2000Hz  3000Hz  4000Hz  6000Hz  8000Hz   Right ear:   0 0 40  0    Left ear:   0 0 0  0     Mini-Cog score: 19/20 MMSE - Mini Mental State Exam 07/08/2017 05/31/2016  Orientation to time 5 5  Orientation to Place 5 5  Registration 3 3  Attention/ Calculation 0 0  Recall 2 3  Recall-comments unable to recall 1 of 3 words -  Language- name 2 objects 0 0  Language- repeat 1 1  Language- follow 3 step command 3 3  Language- read & follow direction 0 0  Write a sentence 0 0  Copy design 0 0  Total score 19 20      07/22/17 today    atrophic vaginitis: followed by [facility]   CAD, PAD, CHF second degree AV block: followed by D.r Rockey Situ.  Last OV: 03/2017 on eliquis, amlodipine , amiodarone and metoprolol, aggressive cholesterol control.  Euvolemic today. HX of CVA    RA: Dx 20  years ago per pt.. No record?  Saw Dr. Jefm Bryant in past. In knees and hips. Stable at this time. On no medication to treat. She tried ? meds.. Refuses. Using  Supplements to treat.  Elevated Cholesterol:  LDL almostat goal on zetia and 40 mg atorvastatin Lab Results  Component Value Date   CHOL 145 07/08/2017   HDL 60.10 07/08/2017   LDLCALC 75 07/08/2017   LDLDIRECT 206.0 03/08/2013   TRIG 52.0 07/08/2017   CHOLHDL 2 07/08/2017  Using medications without problems: none Diet compliance: good Exercise: none Other  complaints:   Hypertension:  Not at goal.. under stress lately.. Moving next week. Using medication without problems or lightheadedness:  Chest pain with exertion: none Edema:none Short of breath: none Average home BPs: Other issues: BP Readings from Last 3 Encounters:  07/22/17 (!) 152/92  07/08/17 (!) 150/84  06/13/17 130/70     Social History /Family History/Past Medical History reviewed in detail and updated in EMR if needed. Blood pressure (!) 152/92, pulse (!) 54, temperature 98.5 F (36.9 C), temperature source Oral, height 5\' 5"  (1.651 m), weight 130 lb 8 oz (59.2 kg), SpO2 98 %.   Review of Systems  Constitutional: Negative for fatigue and fever.  HENT: Negative for congestion.   Eyes: Negative for pain.  Respiratory: Negative for cough and shortness of breath.   Cardiovascular: Negative for chest pain, palpitations and leg swelling.  Gastrointestinal: Negative for abdominal pain.  Genitourinary: Negative for dysuria and vaginal bleeding.  Musculoskeletal: Negative for back pain.  Neurological: Negative for syncope, light-headedness and headaches.  Psychiatric/Behavioral: Negative for dysphoric mood.       Objective:   Physical Exam  Constitutional: Vital signs are normal. She appears well-developed and well-nourished. She is cooperative.  Non-toxic appearance. She does not appear ill. No distress.  HENT:  Head: Normocephalic.  Right Ear: Hearing, tympanic membrane, external ear and ear canal normal.  Left Ear: Hearing, tympanic membrane, external ear and ear canal normal.  Nose: Nose normal.  Eyes: Pupils are equal, round, and reactive to light. Conjunctivae, EOM and lids are normal. Lids are everted and swept, no foreign bodies found.  Neck: Trachea normal and normal range of motion. Neck supple. Carotid bruit is not present. No thyroid mass and no thyromegaly present.  Cardiovascular: Normal rate, regular rhythm, S1 normal, S2 normal, normal heart sounds and  intact distal pulses. Exam reveals no gallop.  No murmur heard. Pulmonary/Chest: Effort normal and breath sounds normal. No respiratory distress. She has no wheezes. She has no rhonchi. She has no rales.  Abdominal: Soft. Normal appearance and bowel sounds are normal. She exhibits no distension, no fluid wave, no abdominal bruit and no mass. There is no hepatosplenomegaly. There is no tenderness. There is no rebound, no guarding and no CVA tenderness. No hernia.  Lymphadenopathy:    She has no cervical adenopathy.    She has no axillary adenopathy.  Neurological: She is alert. She has normal strength. No cranial nerve deficit or sensory deficit.  Skin: Skin is warm, dry and intact. No rash noted.  Psychiatric: Her speech is normal and behavior is normal. Judgment normal. Her mood appears not anxious. Cognition and memory are normal. She does not exhibit a depressed mood.    Cerumen impaction..simple irrigation, uncomplicated.Marland Kitchen Resolved, mild fluid behind Bialteral TMs.       Assessment & Plan:  The patient's preventative maintenance and recommended screening tests for an annual wellness exam were reviewed in full today. Brought up to date unless services declined.  Counselled on the importance of diet, exercise, and its role in overall health and mortality. The patient's FH and SH was reviewed, including their home life, tobacco status, and drug and alcohol status.   DEXA done in 2010.Marland Kitchen Not interested in repeating. Vaccines:Due for PNA and shingles. SE to flu vaccine in past. She is not interested. Due for mammogram,never went last year, not interested in repeating. DVE/pap: not indicated, s/p hysterectomy Colon:never sent back ifob 2014, 2015 or 2016.Marland Kitchen Refused to complete.

## 2017-10-20 ENCOUNTER — Telehealth: Payer: Self-pay | Admitting: Cardiovascular Disease

## 2017-10-20 MED ORDER — APIXABAN 5 MG PO TABS: 5.0000 mg | ORAL_TABLET | Freq: Two times a day (BID) | ORAL | 1 refills | Status: DC

## 2017-10-20 NOTE — Telephone Encounter (Signed)
°*  STAT* If patient is at the pharmacy, call can be transferred to refill team.   1. Which medications need to be refilled? (please list name of each medication and dose if known) Eliquis 5 mg po BID  2. Which pharmacy/location (including street and city if local pharmacy) is medication to be sent to? [facility]   3. Do they need a 30 day or 90 day supply? 1 wk supply out now and waiting on mail    PATIENT IS OUT AND WILL MISS DOSE TONIGHT

## 2017-10-20 NOTE — Telephone Encounter (Signed)
Pt wt 59.2 kg, age 77, serum creatinine, CrCl  54.08.  Eliquis 5 mg BID, #14 w/ 1 refill sent to [facility].

## 2017-10-20 NOTE — Telephone Encounter (Signed)
Please review for refill, Thanks !  

## 2017-12-08 ENCOUNTER — Ambulatory Visit (INDEPENDENT_AMBULATORY_CARE_PROVIDER_SITE_OTHER): Payer: Medicare PPO | Admitting: Vascular Surgery

## 2018-01-10 ENCOUNTER — Telehealth: Payer: Self-pay | Admitting: Family Medicine

## 2018-01-10 DIAGNOSIS — E782 Mixed hyperlipidemia: Secondary | ICD-10-CM

## 2018-01-10 NOTE — Telephone Encounter (Signed)
-----   Message from Lendon Collar, RT sent at 01/05/2018  9:40 AM EDT ----- Regarding: Lab orders for Tuesday 01/13/18 Please enter 47month follow up labs for 01/13/18. Thanks!

## 2018-01-10 NOTE — Telephone Encounter (Signed)
-----   Message from Lendon Collar, RT sent at 01/05/2018  9:40 AM EDT ----- Regarding: Lab orders for Tuesday 01/13/18 Please enter 32month follow up labs for 01/13/18. Thanks!

## 2018-01-13 ENCOUNTER — Other Ambulatory Visit (INDEPENDENT_AMBULATORY_CARE_PROVIDER_SITE_OTHER): Payer: Medicare PPO

## 2018-01-13 DIAGNOSIS — E782 Mixed hyperlipidemia: Secondary | ICD-10-CM

## 2018-01-13 LAB — LIPID PANEL
CHOLESTEROL: 151 mg/dL (ref 0–200)
HDL: 65.6 mg/dL (ref >39.00)
LDL Cholesterol: 77 mg/dL (ref 0–99)
NonHDL: 85.73
TRIGLYCERIDES: 45 mg/dL (ref 0.0–149.0)
Total CHOL/HDL Ratio: 2
VLDL: 9 mg/dL (ref 0.0–40.0)

## 2018-01-13 LAB — COMPREHENSIVE METABOLIC PANEL
ALBUMIN: 4.3 g/dL (ref 3.5–5.2)
ALK PHOS: 62 U/L (ref 39–117)
ALT: 23 U/L (ref 0–35)
AST: 22 U/L (ref 0–37)
BILIRUBIN TOTAL: 0.5 mg/dL (ref 0.2–1.2)
BUN: 21 mg/dL (ref 6–23)
CALCIUM: 9.6 mg/dL (ref 8.4–10.5)
CO2: 32 mEq/L (ref 19–32)
CREATININE: 0.73 mg/dL (ref 0.40–1.20)
Chloride: 106 mEq/L (ref 96–112)
GFR: 82.54 mL/min (ref >60.00)
Glucose, Bld: 95 mg/dL (ref 70–99)
Potassium: 4 mEq/L (ref 3.5–5.1)
Sodium: 142 mEq/L (ref 135–145)
TOTAL PROTEIN: 7.2 g/dL (ref 6.0–8.3)

## 2018-01-20 ENCOUNTER — Ambulatory Visit (INDEPENDENT_AMBULATORY_CARE_PROVIDER_SITE_OTHER): Payer: Medicare PPO | Admitting: Family Medicine

## 2018-01-20 ENCOUNTER — Encounter: Payer: Self-pay | Admitting: Family Medicine

## 2018-01-20 VITALS — BP 150/82 | HR 55 | Temp 98.6°F | Ht 65.0 in | Wt 130.8 lb

## 2018-01-20 DIAGNOSIS — Z8673 Personal history of transient ischemic attack (TIA), and cerebral infarction without residual deficits: Secondary | ICD-10-CM | POA: Diagnosis not present

## 2018-01-20 DIAGNOSIS — E782 Mixed hyperlipidemia: Secondary | ICD-10-CM

## 2018-01-20 DIAGNOSIS — I10 Essential (primary) hypertension: Secondary | ICD-10-CM | POA: Diagnosis not present

## 2018-01-20 DIAGNOSIS — I5022 Chronic systolic (congestive) heart failure: Secondary | ICD-10-CM | POA: Diagnosis not present

## 2018-01-20 DIAGNOSIS — I480 Paroxysmal atrial fibrillation: Secondary | ICD-10-CM

## 2018-01-20 DIAGNOSIS — I69359 Hemiplegia and hemiparesis following cerebral infarction affecting unspecified side: Secondary | ICD-10-CM

## 2018-01-20 NOTE — Patient Instructions (Addendum)
Call to set up appt for year check with Dr. Rockey Situ in 03/2018  Follow BP at home.. Goal < 140/90  Work on trying to get back to walking 3-5 days a week as tolerated.

## 2018-01-20 NOTE — Assessment & Plan Note (Addendum)
Elevated, inadequate control.. Pt very hesitant for increase in BP meds  as she feels she is on a lot of meds.( also pt with bradycardia on current doses).  She denies depression but feel BP in part due to stress. She will follow BP at home and consider discussing at follow up with cards for better control.

## 2018-01-20 NOTE — Assessment & Plan Note (Signed)
On anticoag with Eliquis,amiodarone, amlodipine, metoprolol for rate control.  Not on ACEI/ARB given past SE to ARB. Followed by cardiology.  Dr. Rockey Situ. Euvolemic today.

## 2018-01-20 NOTE — Assessment & Plan Note (Signed)
LDL almost at goal  < 70 on zetia and atorvastatin

## 2018-01-20 NOTE — Progress Notes (Signed)
Subjective:    Patient ID: Karen Bowman, female    DOB: 1940-06-03, 77 y.o.   MRN: [id number]  HPI  77 year old female presents for 6 month follow up.  Has moved.. Now living near Son.Marland Kitchen He is helping her with husbands care. Hypertension:   inadequate control. She feels it is due to stress... recent move, husbands healthissues.   BP Readings from Last 3 Encounters:  01/20/18 (!) 150/82  07/22/17 (!) 152/92  07/08/17 (!) 150/84  Using medication without problems or lightheadedness: none Chest pain with exertion:none Edema:none Short of breath:none Average home BPs: not checking at home lately. Other issues:   CH, paroxsysmal afib: On anticoag with Eliquis,amiodarone, amlodipine, metoprolol for rate control.  Not on ACEI/ARB given past SE to ARB. Followed by cardiology.  Dr. Rockey Situ. Euvolemic today. Wt Readings from Last 3 Encounters:  01/20/18 130 lb 12 oz (59.3 kg)  07/22/17 130 lb 8 oz (59.2 kg)  07/08/17 130 lb 8 oz (59.2 kg)    Elevated Cholesterol:  LDL almost at goal  < 70 on zetia and atorvastatin Lab Results  Component Value Date   CHOL 151 01/13/2018   HDL 65.60 01/13/2018   LDLCALC 77 01/13/2018   LDLDIRECT 206.0 03/08/2013   TRIG 45.0 01/13/2018   CHOLHDL 2 01/13/2018  Using medications without problems: Muscle aches:  healthy Diet compliance: Exercise: none Other complaints:  Hx of CVA with hemiparesis  Social History /Family History/Past Medical History reviewed in detail and updated in EMR if needed. Blood pressure (!) 150/82, pulse (!) 55, temperature 98.6 F (37 C), temperature source Oral, height 5\' 5"  (1.651 m), weight 130 lb 12 oz (59.3 kg).  Review of Systems  Constitutional: Positive for fatigue. Negative for fever.  HENT: Negative for congestion.   Eyes: Negative for pain.  Respiratory: Negative for cough and shortness of breath.   Cardiovascular: Positive for palpitations. Negative for chest pain and leg swelling.  Gastrointestinal:  Negative for abdominal pain.  Genitourinary: Negative for dysuria and vaginal bleeding.  Musculoskeletal: Negative for back pain.  Neurological: Negative for syncope, light-headedness and headaches.  Psychiatric/Behavioral: Negative for dysphoric mood.       Objective:   Physical Exam  Constitutional: Vital signs are normal. She appears well-developed and well-nourished. She is cooperative.  Non-toxic appearance. She does not appear ill. No distress.  HENT:  Head: Normocephalic.  Right Ear: Hearing, tympanic membrane, external ear and ear canal normal. Tympanic membrane is not erythematous, not retracted and not bulging.  Left Ear: Hearing, tympanic membrane, external ear and ear canal normal. Tympanic membrane is not erythematous, not retracted and not bulging.  Nose: No mucosal edema or rhinorrhea. Right sinus exhibits no maxillary sinus tenderness and no frontal sinus tenderness. Left sinus exhibits no maxillary sinus tenderness and no frontal sinus tenderness.  Mouth/Throat: Uvula is midline, oropharynx is clear and moist and mucous membranes are normal.  Eyes: Pupils are equal, round, and reactive to light. Conjunctivae, EOM and lids are normal. Lids are everted and swept, no foreign bodies found.  Neck: Trachea normal and normal range of motion. Neck supple. Carotid bruit is not present. No thyroid mass and no thyromegaly present.  Cardiovascular: Regular rhythm, S1 normal, S2 normal, normal heart sounds, intact distal pulses and normal pulses. Bradycardia present. Exam reveals no gallop and no friction rub.  No murmur heard. Pulmonary/Chest: Effort normal and breath sounds normal. No tachypnea. No respiratory distress. She has no decreased breath sounds. She has no wheezes.  She has no rhonchi. She has no rales.  Abdominal: Soft. Normal appearance and bowel sounds are normal. There is no tenderness.  Neurological: She is alert.  Skin: Skin is warm, dry and intact. No rash noted.    Psychiatric: Her speech is normal and behavior is normal. Judgment and thought content normal. Her mood appears not anxious. Cognition and memory are normal. She does not exhibit a depressed mood.          Assessment & Plan:

## 2018-01-20 NOTE — Assessment & Plan Note (Signed)
Encouraged pt to keep follow up with Dr. Rockey Situ.. Call to schedule 12 month check. She does report intermittent episodes of afib, fast heart rate. She is very hesitant to increase her medication.

## 2018-03-31 ENCOUNTER — Encounter: Payer: Self-pay | Admitting: Cardiovascular Disease

## 2018-03-31 ENCOUNTER — Ambulatory Visit (INDEPENDENT_AMBULATORY_CARE_PROVIDER_SITE_OTHER): Payer: Medicare PPO | Admitting: Cardiovascular Disease

## 2018-03-31 VITALS — BP 128/62 | HR 56 | Ht 65.0 in | Wt 132.0 lb

## 2018-03-31 DIAGNOSIS — I5022 Chronic systolic (congestive) heart failure: Secondary | ICD-10-CM

## 2018-03-31 DIAGNOSIS — I480 Paroxysmal atrial fibrillation: Secondary | ICD-10-CM | POA: Diagnosis not present

## 2018-03-31 DIAGNOSIS — E782 Mixed hyperlipidemia: Secondary | ICD-10-CM

## 2018-03-31 DIAGNOSIS — I10 Essential (primary) hypertension: Secondary | ICD-10-CM | POA: Diagnosis not present

## 2018-03-31 DIAGNOSIS — I63312 Cerebral infarction due to thrombosis of left middle cerebral artery: Secondary | ICD-10-CM

## 2018-03-31 NOTE — Patient Instructions (Signed)

## 2018-03-31 NOTE — Progress Notes (Signed)
Cardiology Office Note  Date:  03/31/2018   ID:  Karen Bowman, DOB 30-May-1940, MRN [id number]  PCP:  Jinny Sanders, MD   Chief Complaint  Patient presents with  . Other    12 month follow up. She does report intermittent episodes of afib and fast heart rate. She is very hesitant to increase her medication.Patient denies chest pain and SOB at this time. Meds reviewed verbally with patient.     HPI:  77 year old woman with history of  paroxysmal atrial fibrillation,  hospitalization July 5 with discharge 10/20/2014 for atrial fibrillation,  discharged on amiodarone, anticoagulation, digoxin, metoprolol.  History of hyperlipidemia, total cholesterol 280 stroke March 2017 Moderate to severe bilateral proximal PCA stenoses. Mild right cavernous and supraclinoid ICA stenosis. She presents today for follow-up of her atrial fibrillation   In follow-up today she reports that she feels relatively well Denies any long runs of atrial fibrillation Rare episodes of 5 to 10 min every few weeks "Nothing serious" Does not have to take extra metoprolol  Walks with a cane when doing long distances No cane in the house for short distances No falls No regular exercise, Chronic fatigue Goes to bed late, sleeps in late Chronic leg swelling  Husband with medical issues, weak, sedentary, dementia, etoh hx  Lab work reviewed Total chol 151, LDL 77  EKG personally reviewed by myself on todays visit Sinus bradycardia rate 56 bpm, no ST or T wave changes  Other past medical hx Echo 05/2015: EF 55 to 60%  Neck CT  05/2015 IMPRESSION: 1. Moderate to severe bilateral proximal PCA stenoses. 2. Mild right cavernous and supraclinoid ICA stenosis.  Other past medical hx reviewed Hospital records were reviewed from March 2017, right side deficits upper extremity, lower extremity, speech deficits TPA was not given CT scan of the head and neck documenting diffuse PAD Discharged with eliquis and  aspirin 81 mg daily She has completed rehabilitation, has been home for approximately one month, Reports improving deficits, able to floss her teeth, brush her hair,  graduated to a cane from a walker Denies any tachycardia or palpitations concerning for arrhythmia  Prior lab work March 2017 showing total cholesterol 270 She reports being compliant with her Lipitor for the past month  Notes indicate TEE with cardioversion 10/18/2014 which was unsuccessful   PMH:   has a past medical history of Acute heart failure ([place]), Anxiety, Arrhythmia, Arthritis, Atrial fibrillation ([place]), Heart murmur, Hyperlipemia, Hypertension, and Stroke ([place]).  PSH:    Past Surgical History:  Procedure Laterality Date  . ABDOMINAL HYSTERECTOMY    . APPENDECTOMY    . BLADDER SUSPENSION    . ELECTROPHYSIOLOGIC STUDY N/A 10/18/2014   Procedure: CARDIOVERSION;  Surgeon: Thayer Headings, MD;  Location: [facility] ORS;  Service: Cardiovascular;  Laterality: N/A;  . TEE WITHOUT CARDIOVERSION N/A 10/18/2014   Procedure: TRANSESOPHAGEAL ECHOCARDIOGRAM (TEE);  Surgeon: Thayer Headings, MD;  Location: [facility] ORS;  Service: Cardiovascular;  Laterality: N/A;  . TONSILLECTOMY    . TUMOR REMOVAL     right arm    Current Outpatient Medications  Medication Sig Dispense Refill  . amiodarone (PACERONE) 200 MG tablet Take 1 tablet (200 mg total) by mouth daily. 90 tablet 3  . amLODipine (NORVASC) 2.5 MG tablet Take 1 tablet (2.5 mg total) by mouth daily. 90 tablet 3  . apixaban (ELIQUIS) 5 MG TABS tablet Take 1 tablet (5 mg total) by mouth 2 (two) times daily. 180 tablet 3  . atorvastatin (  LIPITOR) 40 MG tablet Take 1 tablet (40 mg total) by mouth daily at 6 PM. 90 tablet 3  . Cholecalciferol 1000 UNITS capsule Take 2,000 Units by mouth daily.     Marland Kitchen CINNAMON PO Take 2 tablets by mouth daily.     . Coenzyme Q10-Fish Oil-Vit E (CO-Q 10 OMEGA-3 FISH OIL) CAPS Take 1 capsule by mouth 2 (two) times daily.    Marland Kitchen CRANBERRY PO Take 1  tablet by mouth 2 (two) times daily.     Marland Kitchen EYEBRIGHT PO Take 1 tablet by mouth daily.    Marland Kitchen ezetimibe (ZETIA) 10 MG tablet Take 1 tablet (10 mg total) by mouth daily. 90 tablet 3  . levocetirizine (XYZAL) 5 MG tablet Take 1 tablet (5 mg total) by mouth every evening. 90 tablet 1  . LYSINE PO Take by mouth daily.    . metoprolol tartrate (LOPRESSOR) 25 MG tablet Take 0.5 tablets (12.5 mg total) by mouth 2 (two) times daily. 180 tablet 3  . MISC NATURAL PRODUCTS PO Take 1 tablet by mouth daily. Multivitamin-Dr. Talmage Coin    . mupirocin cream (BACTROBAN) 2 % Apply 1 application topically 2 (two) times daily. 15 g 0  . NON FORMULARY Cell Rebuild Takes 1 tablet BID.    Marland Kitchen NON FORMULARY Canaflex 1 tablet daily.    . NON FORMULARY Epivery Takes 1 tablet bid.    . TURMERIC PO Take by mouth 2 (two) times daily.     No current facility-administered medications for this visit.      Allergies:   Ciprofloxacin; Ciprofloxacin; Hydrochlorothiazide; Hydrochlorothiazide w-triamterene; Sulfa antibiotics; Sulfonamide derivatives; Valsartan; and Valsartan   Social History:  The patient  reports that she has never smoked. She has never used smokeless tobacco. She reports that she does not drink alcohol or use drugs.   Family History:   family history includes Cancer in her father; Diabetes in her maternal aunt; Heart disease in her mother; Osteoporosis in her mother; Stroke in her mother.    Review of Systems: Review of Systems  Constitutional: Negative.   Respiratory: Negative.   Cardiovascular: Positive for leg swelling.  Gastrointestinal: Negative.   Musculoskeletal: Negative.   Neurological: Negative.   Psychiatric/Behavioral: Negative.   All other systems reviewed and are negative.   PHYSICAL EXAM: VS:  BP 128/62 (BP Location: Left Arm, Patient Position: Sitting, Cuff Size: Normal)   Pulse (!) 56   Ht 5\' 5"  (1.651 m)   Wt 132 lb (59.9 kg)   LMP  (LMP Unknown)   BMI 21.97 kg/m  , BMI Body mass  index is 21.97 kg/m. Constitutional:  oriented to person, place, and time. No distress.  HENT:  Head: Grossly normal Eyes:  no discharge. No scleral icterus.  Neck: No JVD, no carotid bruits  Cardiovascular: Regular rate and rhythm, no murmurs appreciated Pulmonary/Chest: Clear to auscultation bilaterally, no wheezes or rails Abdominal: Soft.  no distension.  no tenderness.  Musculoskeletal: Normal range of motion Neurological:  normal muscle tone. Coordination normal. No atrophy Skin: Skin warm and dry Psychiatric: normal affect, pleasant  Recent Labs: 01/13/2018: ALT 23; BUN 21; Creatinine, Ser 0.73; Potassium 4.0; Sodium 142    Lipid Panel Lab Results  Component Value Date   CHOL 151 01/13/2018   HDL 65.60 01/13/2018   LDLCALC 77 01/13/2018   TRIG 45.0 01/13/2018      Wt Readings from Last 3 Encounters:  03/31/18 132 lb (59.9 kg)  01/20/18 130 lb 12 oz (59.3 kg)  07/22/17  130 lb 8 oz (59.2 kg)      ASSESSMENT AND PLAN:  Paroxysmal atrial fibrillation (HCC) - Plan: EKG 12-Lead Rare episodes of atrial fibrillation lasting only 5 to 10 minutes No changes to her medications continue current medications including amiodarone, Eliquis, metoprolol  HYPERLIPIDEMIA Cholesterol at goal, continue current medication  Cerebrovascular accident (CVA) due to thrombosis of left middle cerebral artery (HCC) Prior history of stroke, paroxysmal atrial fibrillation Cerebrovascular disease She has recovered well from the stroke, recovery details discussed with her Stressed importance of continued leg strengthening exercises  Gait instability Walks with a cane, no recent falls Recommended regular walking program, exercise   Total encounter time more than 25 minutes  Greater than 50% was spent in counseling and coordination of care with the patient  Disposition:   F/U  12 months   Total encounter time more than 25 minutes  Greater than 50% was spent in counseling and  coordination of care with the patient    Orders Placed This Encounter  Procedures  . EKG 12-Lead     Signed, Esmond Plants, M.D., Ph.D. 03/31/2018  [place], [place]

## 2018-04-06 ENCOUNTER — Ambulatory Visit: Payer: Medicare PPO | Admitting: Urology

## 2018-04-23 ENCOUNTER — Encounter: Payer: Self-pay | Admitting: Urology

## 2018-04-23 ENCOUNTER — Ambulatory Visit (INDEPENDENT_AMBULATORY_CARE_PROVIDER_SITE_OTHER): Payer: Medicare PPO | Admitting: Urology

## 2018-04-23 ENCOUNTER — Telehealth: Payer: Self-pay | Admitting: Cardiovascular Disease

## 2018-04-23 ENCOUNTER — Other Ambulatory Visit: Payer: Self-pay | Admitting: *Deleted

## 2018-04-23 VITALS — BP 152/46 | HR 56 | Resp 14 | Ht 65.0 in | Wt 130.0 lb

## 2018-04-23 DIAGNOSIS — N3281 Overactive bladder: Secondary | ICD-10-CM | POA: Diagnosis not present

## 2018-04-23 LAB — BLADDER SCAN AMB NON-IMAGING: Scan Result: 0

## 2018-04-23 MED ORDER — ATORVASTATIN CALCIUM 40 MG PO TABS: 40.0000 mg | ORAL_TABLET | Freq: Every day | ORAL | 3 refills | Status: DC

## 2018-04-23 MED ORDER — METOPROLOL TARTRATE 25 MG PO TABS: 12.5000 mg | ORAL_TABLET | Freq: Two times a day (BID) | ORAL | 3 refills | Status: DC

## 2018-04-23 MED ORDER — AMLODIPINE BESYLATE 2.5 MG PO TABS: 2.5000 mg | ORAL_TABLET | Freq: Every day | ORAL | 3 refills | Status: DC

## 2018-04-23 MED ORDER — EZETIMIBE 10 MG PO TABS: 10.0000 mg | ORAL_TABLET | Freq: Every day | ORAL | 3 refills | Status: DC

## 2018-04-23 NOTE — Telephone Encounter (Signed)
Refill Request.  

## 2018-04-23 NOTE — Telephone Encounter (Signed)
°*  STAT* If patient is at the pharmacy, call can be transferred to refill team.   1. Which medications need to be refilled? (please list name of each medication and dose if known)  Eliquis 5 MG - 1 tablet 2 times daily Metoprolol 25 MG 0.5 tablets 2 times daily  Atorvastatin 40 MG 1 tablet daily at 6 PM Amlodipine 2.5 MG - 1 tablet daily  Ezetimibe 10 MG - 1 tablet daily  2. Which pharmacy/location (including street and city if local pharmacy) is medication to be sent to? [facility]  3. Do they need a 30 day or 90 day supply? 90 day

## 2018-04-23 NOTE — Telephone Encounter (Signed)
Requested Prescriptions   Signed Prescriptions Disp Refills  . metoprolol tartrate (LOPRESSOR) 25 MG tablet 180 tablet 3    Sig: Take 0.5 tablets (12.5 mg total) by mouth 2 (two) times daily.    Authorizing Provider: Minna Merritts    Ordering User: Eugenio Hoes, Alexius Ellington C  . atorvastatin (LIPITOR) 40 MG tablet 90 tablet 3    Sig: Take 1 tablet (40 mg total) by mouth daily at 6 PM.    Authorizing Provider: Minna Merritts    Ordering User: Eugenio Hoes, Female Minish C  . amLODipine (NORVASC) 2.5 MG tablet 90 tablet 3    Sig: Take 1 tablet (2.5 mg total) by mouth daily.    Authorizing Provider: Minna Merritts    Ordering User: Othelia Pulling C  . ezetimibe (ZETIA) 10 MG tablet 90 tablet 3    Sig: Take 1 tablet (10 mg total) by mouth daily.    Authorizing Provider: Minna Merritts    Ordering User: Britt Bottom

## 2018-04-23 NOTE — Patient Instructions (Signed)
  You are given a sample of vaginal estrogen cream Premarin and instructed to apply 0.5mg (pea-sized amount)  just inside the vaginal introitus with a finger-tip on Monday, Wednesday and Friday nights,     

## 2018-04-23 NOTE — Progress Notes (Signed)
04/23/2018 2:52 PM   [name] 04/28/1940 [id number]  Referring provider: Jinny Sanders, MD [address] [place], [place] [address]  Chief Complaint  Patient presents with  . Follow-up   HPI: 78 yo WF with vaginal atrophy and a cystocele who returns today for a yearly follow up.  The patient is  experiencing urgency x 0-3 (stable), frequency x 4-7 (stable), not restricting fluids to avoid visits to the restroom, depends on location for engaging in toilet mapping, incontinence x 0-3 (stable) and nocturia x 0-3 (stable).   Her BP is 152/46.   Her PVR is 0 mL. Patient denies any gross hematuria, dysuria or suprapubic/flank pain. Patient denies any fevers, chills, nausea or vomiting.   She discontinued the use of Premarin since she was told to stop the use of estrogen after her stroke from March 2017. She reports that the irritation in the vaginal area was resolved when she was using Premarin.   Background history She did not fill the [place] prescription as it was expensive.  Patient states that she has had urinary incontinence for several years. She is wearing a depends at night. Her incontinence volume is large.   She is having associated urinary frequency, urgency and nocturia.  She does not have a history of urinary tract infections, STI's or injury to the bladder.  She does not have a history of nephrolithiasis, GU surgery or GU trauma.  She is post menopausal.  Incidentally, she has been taking D-Mannose for the last several months.  She stated that her urinary issues have greatly improved since taking the supplement.    PMH: Past Medical History:  Diagnosis Date  . Acute heart failure ([place])   . Anxiety   . Arrhythmia   . Arthritis   . Atrial fibrillation ([place])   . Heart murmur   . Hyperlipemia   . Hypertension   . Stroke [facility])     Surgical History: Past Surgical History:  Procedure Laterality Date  . ABDOMINAL HYSTERECTOMY    . APPENDECTOMY    . BLADDER  SUSPENSION    . ELECTROPHYSIOLOGIC STUDY N/A 10/18/2014   Procedure: CARDIOVERSION;  Surgeon: Thayer Headings, MD;  Location: [facility] ORS;  Service: Cardiovascular;  Laterality: N/A;  . TEE WITHOUT CARDIOVERSION N/A 10/18/2014   Procedure: TRANSESOPHAGEAL ECHOCARDIOGRAM (TEE);  Surgeon: Thayer Headings, MD;  Location: [facility] ORS;  Service: Cardiovascular;  Laterality: N/A;  . TONSILLECTOMY    . TUMOR REMOVAL     right arm    Home Medications:  Allergies as of 04/23/2018      Reactions   Ciprofloxacin    REACTION: dizzy   Ciprofloxacin Nausea And Vomiting   Hydrochlorothiazide Nausea And Vomiting   Hydrochlorothiazide W-triamterene    REACTION: nausea   Sulfa Antibiotics Nausea And Vomiting   Sulfonamide Derivatives    REACTION: nausea \\T \ vomiting   Valsartan    REACTION: angioedema - probable   Valsartan Nausea And Vomiting      Medication List       Accurate as of April 23, 2018  2:52 PM. Always use your most recent med list.        amiodarone 200 MG tablet Commonly known as:  PACERONE Take 1 tablet (200 mg total) by mouth daily.   amLODipine 2.5 MG tablet Commonly known as:  NORVASC Take 1 tablet (2.5 mg total) by mouth daily.   amoxicillin 500 MG capsule Commonly known as:  AMOXIL   apixaban 5 MG  Tabs tablet Commonly known as:  ELIQUIS Take 1 tablet (5 mg total) by mouth 2 (two) times daily.   atorvastatin 40 MG tablet Commonly known as:  LIPITOR Take 1 tablet (40 mg total) by mouth daily at 6 PM.   Cholecalciferol 25 MCG (1000 UT) capsule Take 2,000 Units by mouth daily.   CINNAMON PO Take 2 tablets by mouth daily.   CO-Q 10 Omega-3 Fish Oil Caps Take 1 capsule by mouth 2 (two) times daily.   CRANBERRY PO Take 1 tablet by mouth 2 (two) times daily.   EYEBRIGHT PO Take 1 tablet by mouth daily.   ezetimibe 10 MG tablet Commonly known as:  ZETIA Take 1 tablet (10 mg total) by mouth daily.   levocetirizine 5 MG tablet Commonly known as:  XYZAL Take  1 tablet (5 mg total) by mouth every evening.   LYSINE PO Take by mouth daily.   metoprolol tartrate 25 MG tablet Commonly known as:  LOPRESSOR Take 0.5 tablets (12.5 mg total) by mouth 2 (two) times daily.   MISC NATURAL PRODUCTS PO Take 1 tablet by mouth daily. Multivitamin-Dr. Talmage Coin   mupirocin cream 2 % Commonly known as:  BACTROBAN Apply 1 application topically 2 (two) times daily.   NON FORMULARY Cell Rebuild Takes 1 tablet BID.   NON FORMULARY Canaflex 1 tablet daily.   NON FORMULARY Epivery Takes 1 tablet bid.   TURMERIC PO Take by mouth 2 (two) times daily.       Allergies:  Allergies  Allergen Reactions  . Ciprofloxacin     REACTION: dizzy  . Ciprofloxacin Nausea And Vomiting  . Hydrochlorothiazide Nausea And Vomiting  . Hydrochlorothiazide W-Triamterene     REACTION: nausea  . Sulfa Antibiotics Nausea And Vomiting  . Sulfonamide Derivatives     REACTION: nausea \\T \ vomiting  . Valsartan     REACTION: angioedema - probable  . Valsartan Nausea And Vomiting    Family History: Family History  Problem Relation Age of Onset  . Osteoporosis Mother   . Heart disease Mother   . Stroke Mother   . Cancer Father   . Diabetes Maternal Aunt     Social History:  reports that she has never smoked. She has never used smokeless tobacco. She reports that she does not drink alcohol or use drugs.  ROS: UROLOGY Frequent Urination?: Yes Hard to postpone urination?: Yes Burning/pain with urination?: No Get up at night to urinate?: Yes Leakage of urine?: No Urine stream starts and stops?: No Trouble starting stream?: No Do you have to strain to urinate?: No Blood in urine?: No Urinary tract infection?: No Sexually transmitted disease?: No Injury to kidneys or bladder?: No Painful intercourse?: No Weak stream?: No Currently pregnant?: No Vaginal bleeding?: No  Gastrointestinal Nausea?: No Vomiting?: No Indigestion/heartburn?: No Diarrhea?:  No Constipation?: No  Constitutional Fever: No Night sweats?: No Weight loss?: No Fatigue?: No  Skin Skin rash/lesions?: No Itching?: No  Eyes Blurred vision?: No Double vision?: No  Ears/Nose/Throat Sore throat?: No Sinus problems?: No  Hematologic/Lymphatic Swollen glands?: No Easy bruising?: No  Cardiovascular Leg swelling?: No Chest pain?: No  Respiratory Cough?: No Shortness of breath?: No  Endocrine Excessive thirst?: No  Musculoskeletal Back pain?: No Joint pain?: No  Neurological Headaches?: No Dizziness?: No  Psychologic Depression?: No Anxiety?: No  Physical Exam: BP (!) 152/46   Pulse (!) 56   Resp 14   Ht 5\' 5"  (1.651 m)   Wt 130 lb (59 kg)  LMP  (LMP Unknown)   BMI 21.63 kg/m   Constitutional: Well nourished. Alert and oriented, No acute distress. HEENT: [place] AT, moist mucus membranes. Trachea midline, no masses. Cardiovascular: No clubbing, cyanosis, or edema. Respiratory: Normal respiratory effort, no increased work of breathing. Skin: No rashes, bruises or suspicious lesions. Neurologic: Grossly intact, no focal deficits, moving all 4 extremities. Psychiatric: Normal mood and affect.  Pertinent Imagings:  Results for orders placed or performed in visit on 04/23/18  Bladder Scan (Post Void Residual) in office  Result Value Ref Range   Scan Result 0    Assessment & Plan:     1. Atrophic vaginitis  - discontinued vaginal estrogen cream due to her stroke; re-start using the cream MWF; sample of Premarin given to pt   -F/u in 1 year (around 04/24/2019) for PVR and OAB  Return in about 1 year (around 04/24/2019).  These notes generated with voice recognition software. I apologize for typographical errors.  [facility] [address] [place], [place] [address] [phone]  I, Lucas Mallow, am acting as a Education administrator for [employer],  I have reviewed the  above documentation for accuracy and completeness, and I agree with the above.    Zara Council, PA-C

## 2018-04-23 NOTE — Telephone Encounter (Signed)
Requested Prescriptions   Signed Prescriptions Disp Refills  . metoprolol tartrate (LOPRESSOR) 25 MG tablet 180 tablet 3    Sig: Take 0.5 tablets (12.5 mg total) by mouth 2 (two) times daily.    Authorizing Provider: Minna Merritts    Ordering User: Eugenio Hoes, Drishti Pepperman C  . atorvastatin (LIPITOR) 40 MG tablet 90 tablet 3    Sig: Take 1 tablet (40 mg total) by mouth daily at 6 PM.    Authorizing Provider: Minna Merritts    Ordering User: Eugenio Hoes, Runell Kovich C  . amLODipine (NORVASC) 2.5 MG tablet 90 tablet 3    Sig: Take 1 tablet (2.5 mg total) by mouth daily.    Authorizing Provider: Minna Merritts    Ordering User: Othelia Pulling C  . ezetimibe (ZETIA) 10 MG tablet 90 tablet 3    Sig: Take 1 tablet (10 mg total) by mouth daily.    Authorizing Provider: Minna Merritts    Ordering User: Britt Bottom

## 2018-04-24 MED ORDER — APIXABAN 5 MG PO TABS: 5.0000 mg | ORAL_TABLET | Freq: Two times a day (BID) | ORAL | 1 refills | Status: DC

## 2018-04-24 NOTE — Telephone Encounter (Signed)
Eliquis refill sent in.

## 2018-05-04 ENCOUNTER — Ambulatory Visit (INDEPENDENT_AMBULATORY_CARE_PROVIDER_SITE_OTHER): Payer: Medicare PPO | Admitting: Family Medicine

## 2018-05-04 ENCOUNTER — Encounter: Payer: Self-pay | Admitting: Family Medicine

## 2018-05-04 VITALS — BP 130/70 | HR 62 | Temp 98.5°F | Ht 65.0 in | Wt 132.4 lb

## 2018-05-04 DIAGNOSIS — B9789 Other viral agents as the cause of diseases classified elsewhere: Secondary | ICD-10-CM | POA: Diagnosis not present

## 2018-05-04 DIAGNOSIS — J069 Acute upper respiratory infection, unspecified: Secondary | ICD-10-CM

## 2018-05-04 NOTE — Progress Notes (Signed)
Subjective:    Patient ID: Karen Bowman, female    DOB: [birth year], 78 y.o.   MRN: [id number]  HPI This is a 78 yo female who presents today with episodic cough x 4 days. Nasal congestion. Has been using saline nasal spray with good relief of nasal congestion and decreased cough. No fever or chills, no nausea/vomiting, no SOB, hearing some wheezing yesterday.  Was on recent amoxicillin for dental issues- full week. These symptoms started two days after completing antibiotics. Has root canal scheduled for next week.   Past Medical History:  Diagnosis Date  . Acute heart failure ([place])   . Anxiety   . Arrhythmia   . Arthritis   . Atrial fibrillation ([place])   . Heart murmur   . Hyperlipemia   . Hypertension   . Stroke [facility])    Past Surgical History:  Procedure Laterality Date  . ABDOMINAL HYSTERECTOMY    . APPENDECTOMY    . BLADDER SUSPENSION    . ELECTROPHYSIOLOGIC STUDY N/A 10/18/2014   Procedure: CARDIOVERSION;  Surgeon: Thayer Headings, MD;  Location: [facility] ORS;  Service: Cardiovascular;  Laterality: N/A;  . TEE WITHOUT CARDIOVERSION N/A 10/18/2014   Procedure: TRANSESOPHAGEAL ECHOCARDIOGRAM (TEE);  Surgeon: Thayer Headings, MD;  Location: [facility] ORS;  Service: Cardiovascular;  Laterality: N/A;  . TONSILLECTOMY    . TUMOR REMOVAL     right arm   Family History  Problem Relation Age of Onset  . Osteoporosis Mother   . Heart disease Mother   . Stroke Mother   . Cancer Father   . Diabetes Maternal Aunt    Social History   Tobacco Use  . Smoking status: Never Smoker  . Smokeless tobacco: Never Used  Substance Use Topics  . Alcohol use: No  . Drug use: No      Review of Systems Per HPI    Objective:   Physical Exam Vitals signs reviewed.  Constitutional:      General: She is not in acute distress.    Appearance: Normal appearance. She is normal weight. She is not ill-appearing or toxic-appearing.  HENT:     Head: Normocephalic and atraumatic.     Right Ear:  Tympanic membrane, ear canal and external ear normal.     Left Ear: Tympanic membrane, ear canal and external ear normal.     Nose: Congestion present. No rhinorrhea.     Mouth/Throat:     Mouth: Mucous membranes are moist.     Pharynx: Oropharynx is clear.  Eyes:     Conjunctiva/sclera: Conjunctivae normal.  Neck:     Musculoskeletal: Normal range of motion and neck supple. No neck rigidity or muscular tenderness.  Cardiovascular:     Rate and Rhythm: Normal rate and regular rhythm.     Heart sounds: Murmur present.  Pulmonary:     Effort: Pulmonary effort is normal.     Breath sounds: Normal breath sounds.  Lymphadenopathy:     Cervical: No cervical adenopathy.  Skin:    General: Skin is warm and dry.  Neurological:     Mental Status: She is alert and oriented to person, place, and time.  Psychiatric:        Mood and Affect: Mood normal.        Behavior: Behavior normal.        Thought Content: Thought content normal.        Judgment: Judgment normal.       BP 130/70   Pulse 62  Temp 98.5 F (36.9 C) (Oral)   Ht 5\' 5"  (1.651 m)   Wt 132 lb 6.4 oz (60.1 kg)   LMP  (LMP Unknown)   SpO2 96%   BMI 22.03 kg/m  Wt Readings from Last 3 Encounters:  05/04/18 132 lb 6.4 oz (60.1 kg)  04/23/18 130 lb (59 kg)  03/31/18 132 lb (59.9 kg)       Assessment & Plan:  1. Viral URI with cough - Provided written and verbal information regarding diagnosis and treatment. -  Patient Instructions  Good to see you today  I think you have a cold virus  Continue your nasal rinses, drink enough liquids to make your urine light yellow  Contact the office if you develop a fever over 100, worsening cough or congestion     Clarene Reamer, FNP-BC  [facility], [place]  05/04/2018 1:41 PM

## 2018-05-04 NOTE — Progress Notes (Signed)
Pre visit review using our clinic review tool, if applicable. No additional management support is needed unless otherwise documented below in the visit note. 

## 2018-05-04 NOTE — Patient Instructions (Signed)
Good to see you today  I think you have a cold virus  Continue your nasal rinses, drink enough liquids to make your urine light yellow  Contact the office if you develop a fever over 100, worsening cough or congestion

## 2018-07-22 ENCOUNTER — Ambulatory Visit: Payer: Medicare PPO

## 2018-07-24 ENCOUNTER — Encounter: Payer: Medicare PPO | Admitting: Family Medicine

## 2018-08-10 DIAGNOSIS — E785 Hyperlipidemia, unspecified: Secondary | ICD-10-CM | POA: Diagnosis not present

## 2018-08-10 DIAGNOSIS — N3281 Overactive bladder: Secondary | ICD-10-CM | POA: Diagnosis not present

## 2018-08-10 DIAGNOSIS — I480 Paroxysmal atrial fibrillation: Secondary | ICD-10-CM | POA: Diagnosis not present

## 2018-08-10 DIAGNOSIS — I10 Essential (primary) hypertension: Secondary | ICD-10-CM | POA: Diagnosis not present

## 2018-08-10 DIAGNOSIS — Z90710 Acquired absence of both cervix and uterus: Secondary | ICD-10-CM | POA: Diagnosis not present

## 2018-08-10 DIAGNOSIS — Z9842 Cataract extraction status, left eye: Secondary | ICD-10-CM | POA: Diagnosis not present

## 2018-08-10 DIAGNOSIS — Z9841 Cataract extraction status, right eye: Secondary | ICD-10-CM | POA: Diagnosis not present

## 2018-08-10 DIAGNOSIS — Z8673 Personal history of transient ischemic attack (TIA), and cerebral infarction without residual deficits: Secondary | ICD-10-CM | POA: Diagnosis not present

## 2018-10-28 ENCOUNTER — Other Ambulatory Visit: Payer: Self-pay | Admitting: Cardiovascular Disease

## 2018-10-29 ENCOUNTER — Other Ambulatory Visit: Payer: Self-pay | Admitting: *Deleted

## 2018-10-29 MED ORDER — APIXABAN 5 MG PO TABS: 5.0000 mg | ORAL_TABLET | Freq: Two times a day (BID) | ORAL | 1 refills | Status: DC

## 2018-10-29 NOTE — Telephone Encounter (Signed)
Age 78, weight 60kg, SCr 0.73 on 01/13/18, last OV Dec 2019, afib indication

## 2018-10-29 NOTE — Telephone Encounter (Signed)
Eliquis 5mg  paper refill request received from [facility]; pt is 78 yrs old, wt-60.1kg, Crea-0.73 on 01/13/2018, last seen by Dr. Rockey Situ on 03/03/2018, Diagnosis Afib; will send in refill to requested pharmacy.

## 2018-10-29 NOTE — Telephone Encounter (Signed)
Refill Request.  

## 2018-12-05 ENCOUNTER — Telehealth: Payer: Self-pay | Admitting: Family Medicine

## 2018-12-05 DIAGNOSIS — E782 Mixed hyperlipidemia: Secondary | ICD-10-CM

## 2018-12-05 NOTE — Telephone Encounter (Signed)
-----   Message from Ellamae Sia sent at 12/03/2018 10:46 AM EDT ----- Regarding: Lab orders for Thursday, 9.10.20 Patient is scheduled for CPX labs, please order future labs, Thanks , Karna Christmas

## 2018-12-10 ENCOUNTER — Other Ambulatory Visit (INDEPENDENT_AMBULATORY_CARE_PROVIDER_SITE_OTHER): Payer: Medicare PPO

## 2018-12-10 ENCOUNTER — Ambulatory Visit: Payer: Medicare PPO

## 2018-12-10 DIAGNOSIS — E782 Mixed hyperlipidemia: Secondary | ICD-10-CM | POA: Diagnosis not present

## 2018-12-10 LAB — LIPID PANEL
Cholesterol: 152 mg/dL (ref 0–200)
HDL: 59.6 mg/dL (ref >39.00)
LDL Cholesterol: 79 mg/dL (ref 0–99)
NonHDL: 92.76
Total CHOL/HDL Ratio: 3
Triglycerides: 70 mg/dL (ref 0.0–149.0)
VLDL: 14 mg/dL (ref 0.0–40.0)

## 2018-12-10 LAB — COMPREHENSIVE METABOLIC PANEL
ALT: 25 U/L (ref 0–35)
AST: 26 U/L (ref 0–37)
Albumin: 4.3 g/dL (ref 3.5–5.2)
Alkaline Phosphatase: 63 U/L (ref 39–117)
BUN: 22 mg/dL (ref 6–23)
CO2: 30 mEq/L (ref 19–32)
Calcium: 9.4 mg/dL (ref 8.4–10.5)
Chloride: 106 mEq/L (ref 96–112)
Creatinine, Ser: 0.84 mg/dL (ref 0.40–1.20)
GFR: 65.89 mL/min (ref >60.00)
Glucose, Bld: 95 mg/dL (ref 70–99)
Potassium: 4.2 mEq/L (ref 3.5–5.1)
Sodium: 142 mEq/L (ref 135–145)
Total Bilirubin: 0.6 mg/dL (ref 0.2–1.2)
Total Protein: 7.2 g/dL (ref 6.0–8.3)

## 2018-12-11 NOTE — Progress Notes (Signed)
No critical labs need to be addressed urgently. We will discuss labs in detail at upcoming office visit.   

## 2018-12-17 ENCOUNTER — Encounter: Payer: Medicare PPO | Admitting: Family Medicine

## 2018-12-22 ENCOUNTER — Ambulatory Visit (INDEPENDENT_AMBULATORY_CARE_PROVIDER_SITE_OTHER): Payer: Medicare PPO | Admitting: Family Medicine

## 2018-12-22 ENCOUNTER — Encounter: Payer: Self-pay | Admitting: Family Medicine

## 2018-12-22 VITALS — BP 130/80 | HR 63 | Temp 98.7°F | Ht 64.5 in | Wt 131.5 lb

## 2018-12-22 DIAGNOSIS — I69359 Hemiplegia and hemiparesis following cerebral infarction affecting unspecified side: Secondary | ICD-10-CM | POA: Diagnosis not present

## 2018-12-22 DIAGNOSIS — E782 Mixed hyperlipidemia: Secondary | ICD-10-CM | POA: Diagnosis not present

## 2018-12-22 DIAGNOSIS — Z0000 Encounter for general adult medical examination without abnormal findings: Secondary | ICD-10-CM | POA: Diagnosis not present

## 2018-12-22 DIAGNOSIS — I5022 Chronic systolic (congestive) heart failure: Secondary | ICD-10-CM | POA: Diagnosis not present

## 2018-12-22 DIAGNOSIS — I10 Essential (primary) hypertension: Secondary | ICD-10-CM

## 2018-12-22 DIAGNOSIS — I480 Paroxysmal atrial fibrillation: Secondary | ICD-10-CM | POA: Diagnosis not present

## 2018-12-22 NOTE — Patient Instructions (Addendum)
Work on exercise 3-5 days a week. Work on [employer] .  Preventive Care 76 Years and Older, Female Preventive care refers to lifestyle choices and visits with your health care provider that can promote health and wellness. This includes:  A yearly physical exam. This is also called an annual well check.  Regular dental and eye exams.  Immunizations.  Screening for certain conditions.  Healthy lifestyle choices, such as diet and exercise. What can I expect for my preventive care visit? Physical exam Your health care provider will check:  Height and weight. These may be used to calculate body mass index (BMI), which is a measurement that tells if you are at a healthy weight.  Heart rate and blood pressure.  Your skin for abnormal spots. Counseling Your health care provider may ask you questions about:  Alcohol, tobacco, and drug use.  Emotional well-being.  Home and relationship well-being.  Sexual activity.  Eating habits.  History of falls.  Memory and ability to understand (cognition).  Work and work Statistician.  Pregnancy and menstrual history. What immunizations do I need?  Influenza (flu) vaccine  This is recommended every year. Tetanus, diphtheria, and pertussis (Tdap) vaccine  You may need a Td booster every 10 years. Varicella (chickenpox) vaccine  You may need this vaccine if you have not already been vaccinated. Zoster (shingles) vaccine  You may need this after age 76. Pneumococcal conjugate (PCV13) vaccine  One dose is recommended after age 76. Pneumococcal polysaccharide (PPSV23) vaccine  One dose is recommended after age 76. Measles, mumps, and rubella (MMR) vaccine  You may need at least one dose of MMR if you were born in 1957 or later. You may also need a second dose. Meningococcal conjugate (MenACWY) vaccine  You may need this if you have certain conditions. Hepatitis A vaccine  You may need this if you have certain  conditions or if you travel or work in places where you may be exposed to hepatitis A. Hepatitis B vaccine  You may need this if you have certain conditions or if you travel or work in places where you may be exposed to hepatitis B. Haemophilus influenzae type b (Hib) vaccine  You may need this if you have certain conditions. You may receive vaccines as individual doses or as more than one vaccine together in one shot (combination vaccines). Talk with your health care provider about the risks and benefits of combination vaccines. What tests do I need? Blood tests  Lipid and cholesterol levels. These may be checked every 5 years, or more frequently depending on your overall health.  Hepatitis C test.  Hepatitis B test. Screening  Lung cancer screening. You may have this screening every year starting at age 76 and continuing until age 16. if you have a 30-pack-year history of smoking and currently smoke or have quit within the past 15 years.  Colorectal cancer screening. All adults should have this screening starting at age 76 and continuing until age 16. and continuing until age 16. Your health care provider may recommend screening at age 96 if you are at increased risk. You will have tests every 1-10 years, depending on your results and the type of screening test.  Diabetes screening. This is done by checking your blood sugar (glucose) after you have not eaten for a while (fasting). You may have this done every 1-3 years.  Mammogram. This may be done every 1-2 years. Talk with your health care provider about how often you should have regular mammograms.  BRCA-related cancer screening. This may be done  if you have a family history of breast, ovarian, tubal, or peritoneal cancers. Other tests  Sexually transmitted disease (STD) testing.  Bone density scan. This is done to screen for osteoporosis. You may have this done starting at age 43. Follow these instructions at home: Eating and drinking  Eat a diet that includes fresh fruits and  vegetables, whole grains, lean protein, and low-fat dairy products. Limit your intake of foods with high amounts of sugar, saturated fats, and salt.  Take vitamin and mineral supplements as recommended by your health care provider.  Do not drink alcohol if your health care provider tells you not to drink.  If you drink alcohol: ? Limit how much you have to 0-1 drink a day. ? Be aware of how much alcohol is in your drink. In the [place] one drink equals one 12 oz bottle of beer (355 mL), one 5 oz glass of wine (148 mL), or one 1 oz glass of hard liquor (44 mL). Lifestyle  Take daily care of your teeth and gums.  Stay active. Exercise for at least 30 minutes on 5 or more days each week.  Do not use any products that contain nicotine or tobacco, such as cigarettes, e-cigarettes, and chewing tobacco. If you need help quitting, ask your health care provider.  If you are sexually active, practice safe sex. Use a condom or other form of protection in order to prevent STIs (sexually transmitted infections).  Talk with your health care provider about taking a low-dose aspirin or statin. What's next?  Go to your health care provider once a year for a well check visit.  Ask your health care provider how often you should have your eyes and teeth checked.  Stay up to date on all vaccines. This information is not intended to replace advice given to you by your health care provider. Make sure you discuss any questions you have with your health care provider. Document Released: 04/14/2015 Document Revised: 03/12/2018 Document Reviewed: 03/12/2018 Elsevier Patient Education  2020 [employer].

## 2018-12-22 NOTE — Progress Notes (Signed)
Chief Complaint  Patient presents with  . Medicare Wellness    History of Present Illness: HPI  The patient presents for annual medicare wellness, complete physical and review of chronic health problems. He/She also has the following acute concerns today:none  I have personally reviewed the Medicare Annual Wellness questionnaire and have noted 1. The patient's medical and social history 2. Their use of alcohol, tobacco or illicit drugs 3. Their current medications and supplements 4. The patient's functional ability including ADL's, fall risks, home safety risks and hearing or visual             impairment. 5. Diet and physical activities 6. Evidence for depression or mood disorders 7.         Updated provider list Cognitive evaluation was performed and recorded on pt medicare questionnaire form. The patients weight, height, BMI and visual acuity have been recorded in the chart  I have made referrals, counseling and provided education to the patient based review of the above and I have provided the pt with a written personalized care plan for preventive services.   Documentation of this information was scanned into the electronic record under the media tab.   Advance directives and end of life planning reviewed in detail with patient and documented in EMR. Patient given handout on advance care directives if needed. HCPOA and living will updated if needed.   Hearing Screening   125Hz  250Hz  500Hz  1000Hz  2000Hz  3000Hz  4000Hz  6000Hz  8000Hz   Right ear:   0 0 0  0    Left ear:   0 0 0  0      Visual Acuity Screening   Right eye Left eye Both eyes  Without correction: 20/40 20/30 20/30   With correction:        Office Visit from 12/22/2018 in [facility]  PHQ-2 Total Score  0      Fall Risk  12/22/2018 07/08/2017 06/18/2016 05/31/2016 12/18/2015  Falls in the past year? 0 No No Yes Yes  Comment - - - pt reports 2 falls with injury(bruising) -  Number falls in past  yr: - - - 2 or more 1  Injury with Fall? - - - Yes No  Risk Factor Category  - - - - -  Risk for fall due to : - - - Impaired balance/gait;Impaired mobility -  Follow up - - - Education provided -   atrophic vaginitis: followed by [facility]   CAD, PAD, CHF second degree AV block: followed by [facility].  On eliquis, amlodipine , amiodarone and metoprolol, aggressive cholesterol control.  Euvolemic today. HX of CVA    RA: Dx 20 years ago per pt.. No record? Saw Dr. Jefm Bryant in past. In knees and hips. Stable at this time. On no medication to treat. She tried ? meds.. Refuses. Using Supplements to treat.  Elevated Cholesterol:  LDL almost at goal on zetia and 40 mg atorvastatin Lab Results  Component Value Date   CHOL 152 12/10/2018   HDL 59.60 12/10/2018   LDLCALC 79 12/10/2018   LDLDIRECT 206.0 03/08/2013   TRIG 70.0 12/10/2018   CHOLHDL 3 12/10/2018  Using medications without problems: none Diet compliance: good Exercise: none Other complaints:   Hypertension:   amlodipine at goal  BP Readings from Last 3 Encounters:  12/22/18 130/80  05/04/18 130/70  04/23/18 (!) 152/46  Using medication without problems or lightheadedness:  Chest pain with exertion: none Edema:none Short of breath: none Average home BPs:  Other issues:  Using compression hose for venous insufficiency.  Patient Care Team: Jinny Sanders, MD as PCP - General (Family Medicine) Jinny Sanders, MD Alisa Graff, FNP as Nurse Practitioner (Cardiology) Minna Merritts, MD as Consulting Physician (Cardiology)   COVID 19 screen No recent travel or known exposure to [place] The patient denies respiratory symptoms of COVID 19 at this time.  The importance of social distancing was discussed today.   Review of Systems  Constitutional: Negative for chills and fever.  HENT: Negative for congestion and ear pain.   Eyes: Negative for pain and redness.  Respiratory: Negative for cough and  shortness of breath.   Cardiovascular: Negative for chest pain, palpitations and leg swelling.  Gastrointestinal: Negative for abdominal pain, blood in stool, constipation, diarrhea, nausea and vomiting.  Genitourinary: Negative for dysuria.  Musculoskeletal: Negative for falls and myalgias.  Skin: Negative for rash.  Neurological: Negative for dizziness.  Psychiatric/Behavioral: Negative for depression. The patient is not nervous/anxious.       Past Medical History:  Diagnosis Date  . Acute heart failure ([place])   . Anxiety   . Arrhythmia   . Arthritis   . Atrial fibrillation ([place])   . Heart murmur   . Hyperlipemia   . Hypertension   . Stroke [facility])     reports that she has never smoked. She has never used smokeless tobacco. She reports that she does not drink alcohol or use drugs.   Current Outpatient Medications:  .  amiodarone (PACERONE) 200 MG tablet, Take 1 tablet (200 mg total) by mouth daily., Disp: 90 tablet, Rfl: 3 .  amLODipine (NORVASC) 2.5 MG tablet, Take 1 tablet (2.5 mg total) by mouth daily., Disp: 90 tablet, Rfl: 3 .  apixaban (ELIQUIS) 5 MG TABS tablet, Take 1 tablet (5 mg total) by mouth 2 (two) times daily., Disp: 180 tablet, Rfl: 1 .  atorvastatin (LIPITOR) 40 MG tablet, Take 1 tablet (40 mg total) by mouth daily at 6 PM., Disp: 90 tablet, Rfl: 3 .  Cholecalciferol 1000 UNITS capsule, Take 2,000 Units by mouth daily. , Disp: , Rfl:  .  CINNAMON PO, Take 2 tablets by mouth daily. , Disp: , Rfl:  .  Coenzyme Q10-Fish Oil-Vit E (CO-Q 10 OMEGA-3 FISH OIL) CAPS, Take 1 capsule by mouth 2 (two) times daily., Disp: , Rfl:  .  CRANBERRY PO, Take 1 tablet by mouth 2 (two) times daily. , Disp: , Rfl:  .  EYEBRIGHT PO, Take 1 tablet by mouth daily., Disp: , Rfl:  .  ezetimibe (ZETIA) 10 MG tablet, Take 1 tablet (10 mg total) by mouth daily., Disp: 90 tablet, Rfl: 3 .  levocetirizine (XYZAL) 5 MG tablet, Take 1 tablet (5 mg total) by mouth every evening., Disp: 90 tablet,  Rfl: 1 .  LYSINE PO, Take by mouth daily., Disp: , Rfl:  .  metoprolol tartrate (LOPRESSOR) 25 MG tablet, Take 0.5 tablets (12.5 mg total) by mouth 2 (two) times daily., Disp: 180 tablet, Rfl: 3 .  MISC NATURAL PRODUCTS PO, Take 1 tablet by mouth daily. Multivitamin-Dr. Talmage Coin, Disp: , Rfl:  .  mupirocin cream (BACTROBAN) 2 %, Apply 1 application topically 2 (two) times daily., Disp: 15 g, Rfl: 0 .  NON FORMULARY, Cell Rebuild Takes 1 tablet BID., Disp: , Rfl:  .  NON FORMULARY, Canaflex 1 tablet daily., Disp: , Rfl:  .  NON FORMULARY, Epivery Takes 1 tablet bid., Disp: , Rfl:  .  TURMERIC PO,  Take by mouth 2 (two) times daily., Disp: , Rfl:    Observations/Objective: Blood pressure 130/80, pulse 63, temperature 98.7 F (37.1 C), temperature source Temporal, height 5' 4.5" (1.638 m), weight 131 lb 8 oz (59.6 kg), SpO2 97 %.  Physical Exam Constitutional:      General: She is not in acute distress.    Appearance: Normal appearance. She is well-developed. She is not ill-appearing or toxic-appearing.  HENT:     Head: Normocephalic.     Right Ear: Hearing, tympanic membrane, ear canal and external ear normal.     Left Ear: Hearing, tympanic membrane, ear canal and external ear normal.     Nose: Nose normal.  Eyes:     General: Lids are normal. Lids are everted, no foreign bodies appreciated.     Conjunctiva/sclera: Conjunctivae normal.     Pupils: Pupils are equal, round, and reactive to light.  Neck:     Musculoskeletal: Normal range of motion and neck supple.     Thyroid: No thyroid mass or thyromegaly.     Vascular: No carotid bruit.     Trachea: Trachea normal.  Cardiovascular:     Rate and Rhythm: Normal rate and regular rhythm.     Heart sounds: Normal heart sounds, S1 normal and S2 normal. No murmur. No gallop.   Pulmonary:     Effort: Pulmonary effort is normal. No respiratory distress.     Breath sounds: Normal breath sounds. No wheezing, rhonchi or rales.  Abdominal:      General: Bowel sounds are normal. There is no distension or abdominal bruit.     Palpations: Abdomen is soft. There is no fluid wave or mass.     Tenderness: There is no abdominal tenderness. There is no guarding or rebound.     Hernia: No hernia is present.  Lymphadenopathy:     Cervical: No cervical adenopathy.  Skin:    General: Skin is warm and dry.     Findings: No rash.  Neurological:     Mental Status: She is alert.     Cranial Nerves: No cranial nerve deficit.     Sensory: No sensory deficit.  Psychiatric:        Mood and Affect: Mood is not anxious or depressed.        Speech: Speech normal.        Behavior: Behavior normal. Behavior is cooperative.        Judgment: Judgment normal.      Assessment and Plan   The patient's preventative maintenance and recommended screening tests for an annual wellness exam were reviewed in full today. Brought up to date unless services declined.  Counselled on the importance of diet, exercise, and its role in overall health and mortality. The patient's FH and SH was reviewed, including their home life, tobacco status, and drug and alcohol status.   Reviewed prevention.. she does not wish to proceed with any preventative evaluations as below. 12/22/2018 DEXA done in 2010.Marland Kitchen Not interested in repeating. Vaccines:Due for PNA and shingles. SE to flu vaccine in past. She is not interested. Due for mammogram,never went last year, not interested in repeating. DVE/pap: not indicated, s/p hysterectomy Colon:never sent back ifob 2014, 2015or 2016.Marland Kitchen Refused to complete.  Paroxysmal atrial fibrillation (HCC) On eliquis, amlodipine , amiodarone and metoprolol, aggressive cholesterol control.  Euvolemic today.   Chronic systolic congestive heart failure (HCC) On eliquis, amlodipine , amiodarone and metoprolol, aggressive cholesterol control.  Euvolemic today.   HLD (hyperlipidemia)  LDL almost at goal on zetia and 40 mg  atorvastatin  Hemiparesis affecting dominant side as late effect of stroke ([place]) Improved.  HYPERTENSION, BENIGN ESSENTIAL Well controlled. Continue current medication.     [name], MD

## 2019-01-21 NOTE — Assessment & Plan Note (Signed)
On eliquis, amlodipine , amiodarone and metoprolol, aggressive cholesterol control.  Euvolemic today.

## 2019-01-21 NOTE — Assessment & Plan Note (Signed)
LDL almost at goal on zetia and 40 mg atorvastatin

## 2019-01-21 NOTE — Assessment & Plan Note (Signed)
Improved

## 2019-01-21 NOTE — Assessment & Plan Note (Signed)
Well controlled. Continue current medication.  

## 2019-02-15 ENCOUNTER — Other Ambulatory Visit: Payer: Self-pay | Admitting: Cardiovascular Disease

## 2019-02-15 NOTE — Telephone Encounter (Signed)
metorolol  eliquis    *STAT* If patient is at the pharmacy, call can be transferred to refill team.   1. Which medications need to be refilled? (please list name of each medication and dose if known)   Metoprolol 25 mg po BID  Eliquis 5 mg po BID  2. Which pharmacy/location (including street and city if local pharmacy) is medication to be sent to? [facility]   3. Do they need a 30 day or 90 day supply? [place]

## 2019-02-16 MED ORDER — METOPROLOL TARTRATE 25 MG PO TABS: 12.5000 mg | ORAL_TABLET | Freq: Two times a day (BID) | ORAL | 0 refills | Status: DC

## 2019-02-16 MED ORDER — APIXABAN 5 MG PO TABS: 5.0000 mg | ORAL_TABLET | Freq: Two times a day (BID) | ORAL | 1 refills | Status: DC

## 2019-02-16 NOTE — Telephone Encounter (Signed)
Refill request for Eliquis  Requested Prescriptions   Signed Prescriptions Disp Refills  . metoprolol tartrate (LOPRESSOR) 25 MG tablet 90 tablet 0    Sig: Take 0.5 tablets (12.5 mg total) by mouth 2 (two) times daily. *NEEDS OFFICE VISIT FOR FURTHER REFILLS*    Authorizing Provider: Minna Merritts    Ordering User: Raelene Bott, Ferne Ellingwood L

## 2019-02-16 NOTE — Telephone Encounter (Signed)
Prescription refill request for Eliquis received.  Last office visit: [place], 03-31-2018 Scr: 0.84, 12-10-2018 Age: 78 y.o. Weight: 59.6kg  Prescription refill sent.

## 2019-02-17 ENCOUNTER — Other Ambulatory Visit: Payer: Self-pay

## 2019-02-17 MED ORDER — AMIODARONE HCL 200 MG PO TABS: 200.0000 mg | ORAL_TABLET | Freq: Every day | ORAL | 3 refills | Status: DC

## 2019-04-27 ENCOUNTER — Other Ambulatory Visit: Payer: Self-pay

## 2019-04-27 ENCOUNTER — Ambulatory Visit (INDEPENDENT_AMBULATORY_CARE_PROVIDER_SITE_OTHER): Payer: Medicare Other | Admitting: Urology

## 2019-04-27 ENCOUNTER — Encounter: Payer: Self-pay | Admitting: Urology

## 2019-04-27 VITALS — BP 164/73 | HR 67 | Ht 64.5 in | Wt 131.6 lb

## 2019-04-27 DIAGNOSIS — N3281 Overactive bladder: Secondary | ICD-10-CM

## 2019-04-27 LAB — BLADDER SCAN AMB NON-IMAGING: Scan Result: 28

## 2019-04-27 MED ORDER — PREMARIN 0.625 MG/GM VA CREA: TOPICAL_CREAM | VAGINAL | 12 refills | Status: DC

## 2019-04-27 NOTE — Patient Instructions (Signed)
You are given a sample of vaginal estrogen cream Premarin and instructed to apply 0.5mg  (pea-sized amount)  just inside the vaginal introitus with a finger-tip on Monday, Wednesday and Friday nights.  I have also sent in a prescription for the Premarin cream to continue applying the cream.

## 2019-04-27 NOTE — Progress Notes (Signed)
04/23/2018 12:16 PM   Karen Bowman [birth year] [id number]  Referring provider: Jinny Sanders, MD [address] [place],  [place] [address]  Chief Complaint  Patient presents with  . Over Active Bladder   HPI: 79 yo female with vaginal atrophy and a cystocele who returns today for a yearly follow up.  The patient is  experiencing urgency x 0-3 (stable), frequency x 4-7 (stable), not restricting fluids to avoid visits to the restroom, depends on location for engaging in toilet mapping, incontinence x 0-3 (stable) and nocturia x 4-7 (worsening).   Her BP is 192/73.   Rechecked BP 164/73.  Her PVR is 20 mL.   She drinks most of her fluids in the late afternoon and evenings.    Patient denies any modifying or aggravating factors.  Patient denies any gross hematuria, dysuria or suprapubic/flank pain.  Patient denies any fevers, chills, nausea or vomiting.   She is using her vaginal estrogen cream prn.     PMH: Past Medical History:  Diagnosis Date  . Acute heart failure ([place])   . Anxiety   . Arrhythmia   . Arthritis   . Atrial fibrillation ([place])   . Heart murmur   . Hyperlipemia   . Hypertension   . Stroke [facility])     Surgical History: Past Surgical History:  Procedure Laterality Date  . ABDOMINAL HYSTERECTOMY    . APPENDECTOMY    . BLADDER SUSPENSION    . ELECTROPHYSIOLOGIC STUDY N/A 10/18/2014   Procedure: CARDIOVERSION;  Surgeon: Thayer Headings, MD;  Location: [facility] ORS;  Service: Cardiovascular;  Laterality: N/A;  . TEE WITHOUT CARDIOVERSION N/A 10/18/2014   Procedure: TRANSESOPHAGEAL ECHOCARDIOGRAM (TEE);  Surgeon: Thayer Headings, MD;  Location: [facility] ORS;  Service: Cardiovascular;  Laterality: N/A;  . TONSILLECTOMY    . TUMOR REMOVAL     right arm    Home Medications:  Allergies as of 04/27/2019      Reactions   Ciprofloxacin    REACTION: dizzy   Ciprofloxacin Nausea And Vomiting   Hydrochlorothiazide Nausea And Vomiting   Hydrochlorothiazide  W-triamterene    REACTION: nausea   Sulfa Antibiotics Nausea And Vomiting   Sulfonamide Derivatives    REACTION: nausea \\T \ vomiting   Valsartan    REACTION: angioedema - probable   Valsartan Nausea And Vomiting      Medication List       Accurate as of April 27, 2019 11:59 PM. If you have any questions, ask your nurse or doctor.        amiodarone 200 MG tablet Commonly known as: PACERONE Take 1 tablet (200 mg total) by mouth daily.   amLODipine 2.5 MG tablet Commonly known as: NORVASC Take 1 tablet (2.5 mg total) by mouth daily.   apixaban 5 MG Tabs tablet Commonly known as: Eliquis Take 1 tablet (5 mg total) by mouth 2 (two) times daily.   atorvastatin 40 MG tablet Commonly known as: LIPITOR Take 1 tablet (40 mg total) by mouth daily at 6 PM.   Cholecalciferol 25 MCG (1000 UT) capsule Take 2,000 Units by mouth daily.   CINNAMON PO Take 2 tablets by mouth daily.   CO-Q 10 Omega-3 Fish Oil Caps Take 1 capsule by mouth 2 (two) times daily.   CRANBERRY PO Take 1 tablet by mouth 2 (two) times daily.   EYEBRIGHT PO Take 1 tablet by mouth daily.   ezetimibe 10 MG tablet Commonly known as: ZETIA Take 1 tablet (10 mg  total) by mouth daily.   levocetirizine 5 MG tablet Commonly known as: XYZAL Take 1 tablet (5 mg total) by mouth every evening.   LYSINE PO Take by mouth daily.   metoprolol tartrate 25 MG tablet Commonly known as: LOPRESSOR Take 0.5 tablets (12.5 mg total) by mouth 2 (two) times daily. *NEEDS OFFICE VISIT FOR FURTHER REFILLS*   MISC NATURAL PRODUCTS PO Take 1 tablet by mouth daily. Multivitamin-Dr. Talmage Coin   mupirocin cream 2 % Commonly known as: Bactroban Apply 1 application topically 2 (two) times daily.   NON FORMULARY Cell Rebuild Takes 1 tablet BID.   NON FORMULARY Canaflex 1 tablet daily.   NON FORMULARY Epivery Takes 1 tablet bid.   Premarin vaginal cream Generic drug: conjugated estrogens Apply 0.5mg  (pea-sized amount)   just inside the vaginal introitus with a finger-tip on  Monday, Wednesday and Friday nights. Started by: Zara Council, PA-C   TURMERIC PO Take by mouth 2 (two) times daily.       Allergies:  Allergies  Allergen Reactions  . Ciprofloxacin     REACTION: dizzy  . Ciprofloxacin Nausea And Vomiting  . Hydrochlorothiazide Nausea And Vomiting  . Hydrochlorothiazide W-Triamterene     REACTION: nausea  . Sulfa Antibiotics Nausea And Vomiting  . Sulfonamide Derivatives     REACTION: nausea \\T \ vomiting  . Valsartan     REACTION: angioedema - probable  . Valsartan Nausea And Vomiting    Family History: Family History  Problem Relation Age of Onset  . Osteoporosis Mother   . Heart disease Mother   . Stroke Mother   . Cancer Father   . Diabetes Maternal Aunt     Social History:  reports that she has never smoked. She has never used smokeless tobacco. She reports that she does not drink alcohol or use drugs.  ROS: UROLOGY Frequent Urination?: Yes Hard to postpone urination?: Yes Burning/pain with urination?: No Get up at night to urinate?: Yes Leakage of urine?: Yes Urine stream starts and stops?: No Trouble starting stream?: No Do you have to strain to urinate?: No Blood in urine?: No Urinary tract infection?: No Sexually transmitted disease?: No Injury to kidneys or bladder?: No Painful intercourse?: No Weak stream?: No Currently pregnant?: No Vaginal bleeding?: No Last menstrual period?: n  Gastrointestinal Nausea?: No Vomiting?: No Indigestion/heartburn?: No Diarrhea?: Yes Constipation?: Yes  Constitutional Fever: No Night sweats?: No Weight loss?: No Fatigue?: Yes  Skin Skin rash/lesions?: No Itching?: No  Eyes Blurred vision?: No Double vision?: No  Ears/Nose/Throat Sore throat?: No Sinus problems?: Yes  Hematologic/Lymphatic Swollen glands?: No Easy bruising?: No  Cardiovascular Leg swelling?: Yes Chest pain?: No  Respiratory  Cough?: No Shortness of breath?: No  Endocrine Excessive thirst?: No  Musculoskeletal Back pain?: Yes Joint pain?: Yes  Neurological Headaches?: No Dizziness?: No  Psychologic Depression?: No Anxiety?: No  Physical Exam: BP (!) 164/73   Pulse 67   Ht 5' 4.5" (1.638 m)   Wt 131 lb 9.6 oz (59.7 kg)   LMP  (LMP Unknown)   BMI 22.24 kg/m   Constitutional:  Well nourished. Alert and oriented, No acute distress. HEENT: [place] AT, mask in place.  Trachea midline, no masses. Cardiovascular: No clubbing, cyanosis, or edema. Respiratory: Normal respiratory effort, no increased work of breathing. Neurologic: Grossly intact, no focal deficits, moving all 4 extremities. Psychiatric: Normal mood and affect.   Pertinent Imagings:  Results for orders placed or performed in visit on 04/27/19  Bladder Scan (Post Void Residual)  in office  Result Value Ref Range   Scan Result 28    Assessment & Plan:     1. Atrophic vaginitis Explained to the patient that the vaginal estrogen cream needs to be applied consistently Monday, Wednesday and Friday nights in order to change the vaginal mucosa to premenopausal state, not prn RTC in three months for exam   Return in about 3 months (around 07/26/2019).  These notes generated with voice recognition software. I apologize for typographical errors.  Zara Council, PA-C  [facility] [address] [place], [place] [address] [phone]  I, Lucas Mallow, am acting as a Education administrator for [employer],  I have reviewed the above documentation for accuracy and completeness, and I agree with the above.    Zara Council, PA-C

## 2019-05-30 NOTE — Progress Notes (Signed)
Cardiology Office Note  Date:  05/31/2019   ID:  Karen Bowman, DOB 1940-11-08, MRN [id number]  PCP:  Jinny Sanders, MD   Chief Complaint  Patient presents with  . office visit    Pt states concerns w/ Liptor if the side effects cause loose bowels, if so pt states if medication can be change to something else. Meds verbally reviewed w/ pt.    HPI:  79 year old woman with history of  paroxysmal atrial fibrillation,  hospitalization July 5 with discharge 10/20/2014 for atrial fibrillation,  discharged on amiodarone, anticoagulation, digoxin, metoprolol.  History of hyperlipidemia, total cholesterol 280 stroke March 2017 Moderate to severe bilateral proximal PCA stenoses. Mild right cavernous and supraclinoid ICA stenosis. She presents today for follow-up of her atrial fibrillation   In atrial fib today, not much sx Denies any significant chest tightness or shortness of breath Reports having periodic tachycardia, when she has symptoms she lays down Feels it might be secondary to stress, taking care of her husband  Husband "wearing me out" Has an aide but only for short time a day His memory is "bad" She describes him as "helpless" Overwhelmed by home stress, taking care of him 24 hours a day  She is having some GI upset, likely stress No regular exercise, " no time"  Chronic leg swelling Walks with a cane when doing long distances  EKG personally reviewed by myself on todays visit Shows atrial fib rate 123 bpm   Lab work reviewed Lab Results  Component Value Date   CHOL 152 12/10/2018   HDL 59.60 12/10/2018   LDLCALC 79 12/10/2018   TRIG 70.0 12/10/2018    Other past medical hx Echo 05/2015: EF 55 to 60%  Neck CT  05/2015 IMPRESSION: 1. Moderate to severe bilateral proximal PCA stenoses. 2. Mild right cavernous and supraclinoid ICA stenosis.  Other past medical hx reviewed Hospital records were reviewed from March 2017, right side deficits upper extremity,  lower extremity, speech deficits TPA was not given CT scan of the head and neck documenting diffuse PAD Discharged with eliquis and aspirin 81 mg daily She has completed rehabilitation, has been home for approximately one month, Reports improving deficits, able to floss her teeth, brush her hair,  graduated to a cane from a walker Denies any tachycardia or palpitations concerning for arrhythmia  Notes indicate TEE with cardioversion 10/18/2014 which was unsuccessful   PMH:   has a past medical history of Acute heart failure ([place]), Anxiety, Arrhythmia, Arthritis, Atrial fibrillation ([place]), Heart murmur, Hyperlipemia, Hypertension, and Stroke ([place]).  PSH:    Past Surgical History:  Procedure Laterality Date  . ABDOMINAL HYSTERECTOMY    . APPENDECTOMY    . BLADDER SUSPENSION    . ELECTROPHYSIOLOGIC STUDY N/A 10/18/2014   Procedure: CARDIOVERSION;  Surgeon: Thayer Headings, MD;  Location: [facility] ORS;  Service: Cardiovascular;  Laterality: N/A;  . TEE WITHOUT CARDIOVERSION N/A 10/18/2014   Procedure: TRANSESOPHAGEAL ECHOCARDIOGRAM (TEE);  Surgeon: Thayer Headings, MD;  Location: [facility] ORS;  Service: Cardiovascular;  Laterality: N/A;  . TONSILLECTOMY    . TUMOR REMOVAL     right arm    Current Outpatient Medications  Medication Sig Dispense Refill  . amiodarone (PACERONE) 200 MG tablet Take 1 tablet (200 mg total) by mouth daily. 90 tablet 3  . amLODipine (NORVASC) 2.5 MG tablet Take 1 tablet (2.5 mg total) by mouth daily. 90 tablet 3  . apixaban (ELIQUIS) 5 MG TABS tablet Take 1 tablet (5 mg  total) by mouth 2 (two) times daily. 180 tablet 1  . atorvastatin (LIPITOR) 40 MG tablet Take 1 tablet (40 mg total) by mouth daily at 6 PM. 90 tablet 3  . Cholecalciferol 1000 UNITS capsule Take 2,000 Units by mouth daily.     Marland Kitchen CINNAMON PO Take 2 tablets by mouth daily.     . Coenzyme Q10-Fish Oil-Vit E (CO-Q 10 OMEGA-3 FISH OIL) CAPS Take 1 capsule by mouth 2 (two) times daily.    Marland Kitchen conjugated  estrogens (PREMARIN) vaginal cream Apply 0.5mg  (pea-sized amount)  just inside the vaginal introitus with a finger-tip on  Monday, Wednesday and Friday nights. 30 g 12  . CRANBERRY PO Take 1 tablet by mouth 2 (two) times daily.     Marland Kitchen ezetimibe (ZETIA) 10 MG tablet Take 1 tablet (10 mg total) by mouth daily. 90 tablet 3  . LYSINE PO Take by mouth daily.    . metoprolol tartrate (LOPRESSOR) 25 MG tablet Take 0.5 tablets (12.5 mg total) by mouth 2 (two) times daily. *NEEDS OFFICE VISIT FOR FURTHER REFILLS* 90 tablet 0  . MISC NATURAL PRODUCTS PO Take 1 tablet by mouth daily. Multivitamin-Dr. Talmage Coin    . mupirocin cream (BACTROBAN) 2 % Apply 1 application topically 2 (two) times daily. 15 g 0  . NON FORMULARY Cell Rebuild Takes 1 tablet BID.    Marland Kitchen NON FORMULARY Canaflex 1 tablet daily.    . NON FORMULARY Epivery Takes 1 tablet bid.    . TURMERIC PO Take by mouth 2 (two) times daily.     No current facility-administered medications for this visit.    Allergies:   Ciprofloxacin, Ciprofloxacin, Hydrochlorothiazide, Hydrochlorothiazide w-triamterene, Sulfa antibiotics, Sulfonamide derivatives, Valsartan, and Valsartan   Social History:  The patient  reports that she has never smoked. She has never used smokeless tobacco. She reports that she does not drink alcohol or use drugs.   Family History:   family history includes Cancer in her father; Diabetes in her maternal aunt; Heart disease in her mother; Osteoporosis in her mother; Stroke in her mother.    Review of Systems: Review of Systems  Constitutional: Negative.   HENT: Negative.   Respiratory: Negative.   Cardiovascular: Positive for leg swelling.  Gastrointestinal: Positive for diarrhea.  Musculoskeletal: Negative.   Neurological: Negative.   Psychiatric/Behavioral: Negative.   All other systems reviewed and are negative.   PHYSICAL EXAM: VS:  BP (!) 110/56 (BP Location: Left Arm, Patient Position: Sitting, Cuff Size: Normal)    Pulse (!) 123   Ht 5\' 5"  (1.651 m)   Wt 128 lb 8 oz (58.3 kg)   LMP  (LMP Unknown)   SpO2 97%   BMI 21.38 kg/m  , BMI Body mass index is 21.38 kg/m. Constitutional:  oriented to person, place, and time. No distress.  HENT:  Head: Grossly normal Eyes:  no discharge. No scleral icterus.  Neck: No JVD, no carotid bruits  Cardiovascular: irreg irreg,  no murmurs appreciated Pulmonary/Chest: Clear to auscultation bilaterally, no wheezes or rails Abdominal: Soft.  no distension.  no tenderness.  Musculoskeletal: Normal range of motion Neurological:  normal muscle tone. Coordination normal. No atrophy Skin: Skin warm and dry Psychiatric: normal affect, pleasant  Recent Labs: 12/10/2018: ALT 25; BUN 22; Creatinine, Ser 0.84; Potassium 4.2; Sodium 142    Lipid Panel Lab Results  Component Value Date   CHOL 152 12/10/2018   HDL 59.60 12/10/2018   LDLCALC 79 12/10/2018   TRIG 70.0 12/10/2018  Wt Readings from Last 3 Encounters:  05/31/19 128 lb 8 oz (58.3 kg)  04/27/19 131 lb 9.6 oz (59.7 kg)  12/22/18 131 lb 8 oz (59.6 kg)      ASSESSMENT AND PLAN:  Paroxysmal atrial fibrillation (HCC) - Plan: EKG 12-Lead In atrial fib today, to be asymptomatic amio 200 BID 1 week, Then down to once a day Then recommended she call us with some heart rate numbers to confirm she is back in normal sinus rhythm Continue Eliquis, metoprolol Medications refilled  HYPERLIPIDEMIA Cholesterol above goal, meds refilled, compliance suggested Med compliance recommended  Cerebrovascular accident (CVA) due to thrombosis of left middle cerebral artery (HCC) Prior history of stroke, paroxysmal atrial fibrillation Known cerebrovascular disease On eliquis  Gait instability Walks with a cane, no recent falls Need exercise plan  Adjustment disorder Difficulty handling a debilitated husband with dementia, cardiac and GI disease, debility Son presenting with her today, offering assistance as  well Long discussion with her concerning need for assistance at home, need for PT for him to get stronger following recent hospitalization   Total encounter time more than 25 minutes  Greater than 50% was spent in counseling and coordination of care with the patient  Disposition:   F/U  12 months   No orders of the defined types were placed in this encounter.    Signed, Esmond Plants, M.D., Ph.D. 05/31/2019  [place], [place]

## 2019-05-31 ENCOUNTER — Encounter: Payer: Self-pay | Admitting: Cardiovascular Disease

## 2019-05-31 ENCOUNTER — Ambulatory Visit: Payer: Medicare PPO | Admitting: Cardiovascular Disease

## 2019-05-31 ENCOUNTER — Ambulatory Visit (INDEPENDENT_AMBULATORY_CARE_PROVIDER_SITE_OTHER): Payer: Medicare Other | Admitting: Cardiovascular Disease

## 2019-05-31 ENCOUNTER — Other Ambulatory Visit: Payer: Self-pay

## 2019-05-31 VITALS — BP 110/56 | HR 123 | Ht 65.0 in | Wt 128.5 lb

## 2019-05-31 DIAGNOSIS — I10 Essential (primary) hypertension: Secondary | ICD-10-CM | POA: Diagnosis not present

## 2019-05-31 DIAGNOSIS — I5022 Chronic systolic (congestive) heart failure: Secondary | ICD-10-CM | POA: Diagnosis not present

## 2019-05-31 DIAGNOSIS — I63312 Cerebral infarction due to thrombosis of left middle cerebral artery: Secondary | ICD-10-CM

## 2019-05-31 DIAGNOSIS — I480 Paroxysmal atrial fibrillation: Secondary | ICD-10-CM

## 2019-05-31 MED ORDER — ATORVASTATIN CALCIUM 40 MG PO TABS: 40.0000 mg | ORAL_TABLET | Freq: Every day | ORAL | 3 refills | Status: DC

## 2019-05-31 MED ORDER — AMIODARONE HCL 200 MG PO TABS: ORAL_TABLET | ORAL | 0 refills | Status: DC

## 2019-05-31 MED ORDER — AMLODIPINE BESYLATE 2.5 MG PO TABS: 2.5000 mg | ORAL_TABLET | Freq: Every day | ORAL | 3 refills | Status: DC

## 2019-05-31 MED ORDER — EZETIMIBE 10 MG PO TABS: 10.0000 mg | ORAL_TABLET | Freq: Every day | ORAL | 3 refills | Status: DC

## 2019-05-31 MED ORDER — METOPROLOL TARTRATE 25 MG PO TABS: 12.5000 mg | ORAL_TABLET | Freq: Two times a day (BID) | ORAL | 3 refills | Status: DC

## 2019-05-31 MED ORDER — AMIODARONE HCL 200 MG PO TABS: 200.0000 mg | ORAL_TABLET | Freq: Every day | ORAL | 3 refills | Status: DC

## 2019-05-31 MED ORDER — APIXABAN 5 MG PO TABS: 5.0000 mg | ORAL_TABLET | Freq: Two times a day (BID) | ORAL | 3 refills | Status: DC

## 2019-05-31 NOTE — Patient Instructions (Addendum)
  Medication Instructions:   Please take amiodarone 1 pill twice a day for 7 days then down to once a day Stay on metoprolol 1/2 pill twice a day   If you need a refill on your cardiac medications before your next appointment, please call your pharmacy.    Lab work: No new labs needed   If you have labs (blood work) drawn today and your tests are completely normal, you will receive your results only by: Marland Kitchen MyChart Message (if you have MyChart) OR . A paper copy in the mail If you have any lab test that is abnormal or we need to change your treatment, we will call you to review the results.   Testing/Procedures: No new testing needed   Follow-Up: At [facility], you and your health needs are our priority.  As part of our continuing mission to provide you with exceptional heart care, we have created designated Provider Care Teams.  These Care Teams include your primary Cardiologist (physician) and Advanced Practice Providers (APPs -  Physician Assistants and Nurse Practitioners) who all work together to provide you with the care you need, when you need it.  . You will need a follow up appointment in 6 months   . Providers on your designated Care Team:   . Murray Hodgkins, NP . Christell Faith, PA-C . Marrianne Mood, PA-C  Any Other Special Instructions Will Be Listed Below (If Applicable).  COVID-19 Vaccine Information can be found at: ShippingScam.co.uk For questions related to vaccine distribution or appointments, please email vaccine@[email or web address] .[email or web address] or call [phone].

## 2019-05-31 NOTE — Progress Notes (Signed)
Medication Samples have been provided to the patient.  Drug name: Eliquis Strength: 5 mg Qty: 4 boxes LOT: [id number] Exp.Date: 01/23

## 2019-06-01 NOTE — Addendum Note (Signed)
Addended by: Casimer Lanius on: 06/01/2019 02:29 PM   Modules accepted: Orders

## 2019-07-06 ENCOUNTER — Other Ambulatory Visit: Payer: Self-pay | Admitting: Cardiovascular Disease

## 2019-07-14 ENCOUNTER — Telehealth: Payer: Self-pay | Admitting: Family Medicine

## 2019-07-14 NOTE — Chronic Care Management (AMB) (Signed)
°  Chronic Care Management   Note  07/14/2019 Name: BREN FAZIO MRN: [id number] DOB: 1940-04-10  DALEIGH DIKEMAN is a 79 y.o. year old female who is a primary care patient of Bedsole, Amy E, MD. I reached out to [name] by phone today in response to a referral sent by Ms. Delsa Sale PCP, Jinny Sanders, MD.   Ms. Petillo was given information about Chronic Care Management services today including:  1. CCM service includes personalized support from designated clinical staff supervised by her physician, including individualized plan of care and coordination with other care providers 2. 24/7 contact phone numbers for assistance for urgent and routine care needs. 3. Service will only be billed when office clinical staff spend 20 minutes or more in a month to coordinate care. 4. Only one practitioner may furnish and bill the service in a calendar month. 5. The patient may stop CCM services at any time (effective at the end of the month) by phone call to the office staff.   Patient agreed to services and verbal consent obtained.   Follow up plan:   Raynicia Dukes [employer] Scheduler

## 2019-07-27 ENCOUNTER — Ambulatory Visit: Payer: Self-pay | Admitting: Urology

## 2019-07-28 ENCOUNTER — Other Ambulatory Visit: Payer: Self-pay

## 2019-07-28 ENCOUNTER — Encounter: Payer: Self-pay | Admitting: Urology

## 2019-07-28 ENCOUNTER — Ambulatory Visit (INDEPENDENT_AMBULATORY_CARE_PROVIDER_SITE_OTHER): Payer: Medicare Other | Admitting: Urology

## 2019-07-28 VITALS — BP 160/66 | HR 66 | Ht 65.0 in | Wt 124.0 lb

## 2019-07-28 DIAGNOSIS — N3281 Overactive bladder: Secondary | ICD-10-CM | POA: Diagnosis not present

## 2019-07-28 NOTE — Progress Notes (Signed)
04/23/2018 9:40 AM   Karen Bowman 02/14/1941 [id number]  Referring provider: Jinny Sanders, MD [address] [place],  [place] [address]  Chief Complaint  Patient presents with  . Follow-up   HPI: 78 yo female with vaginal atrophy and a cystocele who returns today for a 3 month follow up.  Today, she is having nocturia x2 3 and intermittent urgent urination and mixed urinary incontinence.  Patient denies any modifying or aggravating factors.  Patient denies any gross hematuria, dysuria or suprapubic/flank pain.  Patient denies any fevers, chills, nausea or vomiting.   Her UA is yellow cloudy, nitrite negative, 11-30 WBC's and many bacteria.  She is applying the vaginal estrogen cream three nights weekly.      PMH: Past Medical History:  Diagnosis Date  . Acute heart failure ([place])   . Anxiety   . Arrhythmia   . Arthritis   . Atrial fibrillation ([place])   . Heart murmur   . Hyperlipemia   . Hypertension   . Stroke [facility])     Surgical History: Past Surgical History:  Procedure Laterality Date  . ABDOMINAL HYSTERECTOMY    . APPENDECTOMY    . BLADDER SUSPENSION    . ELECTROPHYSIOLOGIC STUDY N/A 10/18/2014   Procedure: CARDIOVERSION;  Surgeon: Thayer Headings, MD;  Location: [facility] ORS;  Service: Cardiovascular;  Laterality: N/A;  . TEE WITHOUT CARDIOVERSION N/A 10/18/2014   Procedure: TRANSESOPHAGEAL ECHOCARDIOGRAM (TEE);  Surgeon: Thayer Headings, MD;  Location: [facility] ORS;  Service: Cardiovascular;  Laterality: N/A;  . TONSILLECTOMY    . TUMOR REMOVAL     right arm    Home Medications:  Allergies as of 07/28/2019      Reactions   Ciprofloxacin    REACTION: dizzy   Ciprofloxacin Nausea And Vomiting   Hydrochlorothiazide Nausea And Vomiting   Hydrochlorothiazide W-triamterene    REACTION: nausea   Sulfa Antibiotics Nausea And Vomiting   Sulfonamide Derivatives    REACTION: nausea \\T \ vomiting   Valsartan    REACTION: angioedema - probable   Valsartan  Nausea And Vomiting      Medication List       Accurate as of July 28, 2019 11:59 PM. If you have any questions, ask your nurse or doctor.        amiodarone 200 MG tablet Commonly known as: PACERONE Take 1 tablet (200 mg total) by mouth 2 (two) times daily for 7 days, THEN 1 tablet (200 mg total) daily. Start taking on: May 31, 2019   amiodarone 200 MG tablet Commonly known as: PACERONE Take 1 tablet (200 mg total) by mouth daily.   amLODipine 2.5 MG tablet Commonly known as: NORVASC Take 1 tablet (2.5 mg total) by mouth daily.   apixaban 5 MG Tabs tablet Commonly known as: Eliquis Take 1 tablet (5 mg total) by mouth 2 (two) times daily.   atorvastatin 40 MG tablet Commonly known as: LIPITOR Take 1 tablet (40 mg total) by mouth daily at 6 PM.   Cholecalciferol 25 MCG (1000 UT) capsule Take 2,000 Units by mouth daily.   CINNAMON PO Take 2 tablets by mouth daily.   CO-Q 10 Omega-3 Fish Oil Caps Take 1 capsule by mouth 2 (two) times daily.   CRANBERRY PO Take 1 tablet by mouth 2 (two) times daily.   ezetimibe 10 MG tablet Commonly known as: ZETIA Take 1 tablet (10 mg total) by mouth daily.   LYSINE PO Take by mouth daily.  metoprolol tartrate 25 MG tablet Commonly known as: LOPRESSOR Take 0.5 tablets (12.5 mg total) by mouth 2 (two) times daily.   MISC NATURAL PRODUCTS PO Take 1 tablet by mouth daily. Multivitamin-Dr. Talmage Coin   mupirocin cream 2 % Commonly known as: Bactroban Apply 1 application topically 2 (two) times daily.   NON FORMULARY Cell Rebuild Takes 1 tablet BID.   NON FORMULARY Canaflex 1 tablet daily.   NON FORMULARY Epivery Takes 1 tablet bid.   Premarin vaginal cream Generic drug: conjugated estrogens Apply 0.5mg  (pea-sized amount)  just inside the vaginal introitus with a finger-tip on  Monday, Wednesday and Friday nights.   TURMERIC PO Take by mouth 2 (two) times daily.       Allergies:  Allergies  Allergen Reactions   . Ciprofloxacin     REACTION: dizzy  . Ciprofloxacin Nausea And Vomiting  . Hydrochlorothiazide Nausea And Vomiting  . Hydrochlorothiazide W-Triamterene     REACTION: nausea  . Sulfa Antibiotics Nausea And Vomiting  . Sulfonamide Derivatives     REACTION: nausea \\T \ vomiting  . Valsartan     REACTION: angioedema - probable  . Valsartan Nausea And Vomiting    Family History: Family History  Problem Relation Age of Onset  . Osteoporosis Mother   . Heart disease Mother   . Stroke Mother   . Cancer Father   . Diabetes Maternal Aunt     Social History:  reports that she has never smoked. She has never used smokeless tobacco. She reports that she does not drink alcohol or use drugs.  ROS: For pertinent review of systems please refer to history of present illness  Physical Exam: BP (!) 160/66   Pulse 66   Ht 5\' 5"  (1.651 m)   Wt 124 lb (56.2 kg)   LMP  (LMP Unknown)   BMI 20.63 kg/m   Constitutional:  Well nourished. Alert and oriented, No acute distress. HEENT: [place] AT, mask in place.  Trachea midline Cardiovascular: No clubbing, cyanosis, or edema. Respiratory: Normal respiratory effort, no increased work of breathing. Neurologic: Grossly intact, no focal deficits, moving all 4 extremities. Psychiatric: Normal mood and affect.   Laboratory Data: Results for orders placed or performed in visit on 07/28/19  Microscopic Examination   URINE  Result Value Ref Range   WBC, UA 11-30 (A) 0 - 5 /hpf   RBC None seen 0 - 2 /hpf   Epithelial Cells (non renal) 0-10 0 - 10 /hpf   Renal Epithel, UA 0-10 (A) None seen /hpf   Bacteria, UA Many (A) None seen/Few  Urinalysis, Complete  Result Value Ref Range   Specific Gravity, UA 1.020 1.005 - 1.030   pH, UA 5.5 5.0 - 7.5   Color, UA Yellow Yellow   Appearance Ur Cloudy (A) Clear   Leukocytes,UA 1+ (A) Negative   Protein,UA Negative Negative/Trace   Glucose, UA Negative Negative   Ketones, UA Negative Negative   RBC, UA  Negative Negative   Bilirubin, UA Negative Negative   Urobilinogen, Ur 0.2 0.2 - 1.0 mg/dL   Nitrite, UA Negative Negative   Microscopic Examination See below:    Assessment & Plan:     1. Atrophic vaginitis Continue the vaginal estrogen cream 3 nights weekly She will require OTC vaginal lubricant to use on days she is not applying the vaginal estrogen cream She return in 1 year for OAB questionnaire, PVR and exam  Return in about 1 year (around 07/27/2020) for exam .  These  notes generated with voice recognition software. I apologize for typographical errors.  Zara Council, PA-C  [facility] [address] [place], [place] [address] [phone]

## 2019-07-29 LAB — MICROSCOPIC EXAMINATION: RBC, Urine: NONE SEEN /hpf (ref 0–2)

## 2019-07-29 LAB — URINALYSIS, COMPLETE
Bilirubin, UA: NEGATIVE
Glucose, UA: NEGATIVE
Ketones, UA: NEGATIVE
Nitrite, UA: NEGATIVE
Protein,UA: NEGATIVE
RBC, UA: NEGATIVE
Specific Gravity, UA: 1.02 (ref 1.005–1.030)
Urobilinogen, Ur: 0.2 mg/dL (ref 0.2–1.0)
pH, UA: 5.5 (ref 5.0–7.5)

## 2019-08-06 ENCOUNTER — Telehealth: Payer: Self-pay | Admitting: Family Medicine

## 2019-08-06 ENCOUNTER — Telehealth: Payer: Self-pay

## 2019-08-06 DIAGNOSIS — I10 Essential (primary) hypertension: Secondary | ICD-10-CM

## 2019-08-06 DIAGNOSIS — I480 Paroxysmal atrial fibrillation: Secondary | ICD-10-CM

## 2019-08-06 NOTE — Telephone Encounter (Signed)
Patient called in regards to appointment on 5/10 @ 12:00pm.  She stated that she did not mean to cancel the appointment and would still like the phone call that day.  Can you place her back on your schedule??

## 2019-08-06 NOTE — Telephone Encounter (Signed)
Yes, I rescheduled her at the original appt date/time.  Thanks!

## 2019-08-06 NOTE — Telephone Encounter (Signed)
Please sign referral

## 2019-08-06 NOTE — Telephone Encounter (Signed)
Per written referral from PCP, requesting referral in Epic for Karen Bowman to chronic care management pharmacy services for the following conditions:   Essential hypertension, benign  [I10]  PAF (paroxysmal atrial fibrillation) ([place]) [I48.0]  [name], PharmD Clinical Pharmacist [facility] at [facility] [phone]

## 2019-08-09 ENCOUNTER — Telehealth: Payer: Medicare Other

## 2019-08-09 ENCOUNTER — Other Ambulatory Visit: Payer: Self-pay

## 2019-08-09 ENCOUNTER — Ambulatory Visit: Payer: Medicare Other

## 2019-08-09 DIAGNOSIS — E782 Mixed hyperlipidemia: Secondary | ICD-10-CM

## 2019-08-09 DIAGNOSIS — I5022 Chronic systolic (congestive) heart failure: Secondary | ICD-10-CM

## 2019-08-09 DIAGNOSIS — I10 Essential (primary) hypertension: Secondary | ICD-10-CM

## 2019-08-09 NOTE — Patient Instructions (Addendum)
Aug 09, 2019  Dear Karen Bowman,  It was a pleasure meeting you during our initial appointment on Aug 09, 2019. Below is a summary of the goals we discussed and components of chronic care management. Please contact me anytime with questions or concerns.   Visit Information  Goals Addressed            This Visit's Progress   . Pharmacy Care Plan       CARE PLAN ENTRY  Current Barriers:  . Chronic Disease Management support, education, and care coordination needs related to Hypertension, Hyperlipidemia, and Heart Failure   Hypertension . Pharmacist Clinical Goal(s): o Over the next 30 days, patient will work with PharmD and providers to achieve BP goal <140/90 mmHg . Current regimen:  o Amlodipine 2.5 mg - 1 tablet daily o Metoprolol tartrate 25 mg - 1/2 tablet BID . Interventions: o Recommend checking BP at home 2-3 days per week   . Patient self care activities - Over the next 30 days, patient will: o Check blood pressure 2-3 days per week, document, and provide at future follow up appointment in 1 month (telephone) with pharmacist o Ensure daily salt intake < 2300 mg/day  Hyperlipidemia . Pharmacist Clinical Goal(s): o Over the next 30 days, patient will work with PharmD and providers to achieve LDL goal < 70 . Current regimen:  o Atorvastatin 40 mg - 1 tablet daily o Ezetimibe 10 mg - 1 tablet daily . Interventions: o Continue current medications o Improve dietary choices  . Patient self care activities - Over the next 30 days, patient will: o Limit cholesterol content in foods (see handout)  Heart Failure . Pharmacist Clinical Goal(s): o Over the next 30 days, patient will work with PharmD and providers to prevent heart failure exacerbations due to fluid overload . Current regimen:  o Metoprolol tartrate 25 mg - 1/2 tablet BID . Interventions: o Continue current medications o Counseling on managing weight gain/fluid overload . Patient self care activities -  Over the next 30 days, patient will: o Check weight daily, if you gain more than 3 pounds in one day or 5 pounds in one week call your doctor  Initial goal documentation      Karen Bowman was given information about Chronic Care Management services today including:  1. CCM service includes personalized support from designated clinical staff supervised by her physician, including individualized plan of care and coordination with other care providers 2. 24/7 contact phone numbers for assistance for urgent and routine care needs. 3. Standard insurance, coinsurance, copays and deductibles apply for chronic care management only during months in which we provide at least 20 minutes of these services. Most insurances cover these services at 100%, however patients may be responsible for any copay, coinsurance and/or deductible if applicable. This service may help you avoid the need for more expensive face-to-face services. 4. Only one practitioner may furnish and bill the service in a calendar month. 5. The patient may stop CCM services at any time (effective at the end of the month) by phone call to the office staff.  Patient agreed to services and verbal consent obtained.   The patient verbalized understanding of instructions provided today and agreed to receive a mailed copy of patient instruction and/or educational materials. Telephone follow up appointment with pharmacy team member scheduled for:  09/09/2019 at 9:30 AM (1 month for blood pressure log)  [name], PharmD Clinical Pharmacist [facility] at [facility] [phone]  Cholesterol Content in Foods Cholesterol is a waxy, fat-like substance that helps to carry fat in the blood. The body needs cholesterol in small amounts, but too much cholesterol can cause damage to the arteries and heart. Most people should eat less than 200 milligrams (mg) of cholesterol a day. Foods with cholesterol  Cholesterol is found in  animal-based foods, such as meat, seafood, and dairy. Generally, low-fat dairy and lean meats have less cholesterol than full-fat dairy and fatty meats. The milligrams of cholesterol per serving (mg per serving) of common cholesterol-containing foods are listed below. Meat and other proteins  Egg -- one large whole egg has 186 mg.  Veal shank -- 4 oz has 141 mg.  Lean ground [place] (93% lean) -- 4 oz has 118 mg.  Fat-trimmed lamb loin -- 4 oz has 106 mg.  Lean ground beef (90% lean) -- 4 oz has 100 mg.  Lobster -- 3.5 oz has 90 mg.  Pork loin chops -- 4 oz has 86 mg.  Canned salmon -- 3.5 oz has 83 mg.  Fat-trimmed beef top loin -- 4 oz has 78 mg.  Frankfurter -- 1 frank (3.5 oz) has 77 mg.  Crab -- 3.5 oz has 71 mg.  Roasted chicken without skin, white meat -- 4 oz has 66 mg.  Light bologna -- 2 oz has 45 mg.  Deli-cut [place] -- 2 oz has 31 mg.  Canned tuna -- 3.5 oz has 31 mg.  Karen Bowman -- 1 oz has 29 mg.  Oysters and mussels (raw) -- 3.5 oz has 25 mg.  Mackerel -- 1 oz has 22 mg.  Trout -- 1 oz has 20 mg.  Pork sausage -- 1 link (1 oz) has 17 mg.  Salmon -- 1 oz has 16 mg.  Tilapia -- 1 oz has 14 mg. Dairy  Soft-serve ice cream --  cup (4 oz) has 103 mg.  Whole-milk yogurt -- 1 cup (8 oz) has 29 mg.  Cheddar cheese -- 1 oz has 28 mg.  American cheese -- 1 oz has 28 mg.  Whole milk -- 1 cup (8 oz) has 23 mg.  2% milk -- 1 cup (8 oz) has 18 mg.  Cream cheese -- 1 tablespoon (Tbsp) has 15 mg.  Cottage cheese --  cup (4 oz) has 14 mg.  Low-fat (1%) milk -- 1 cup (8 oz) has 10 mg.  Sour cream -- 1 Tbsp has 8.5 mg.  Low-fat yogurt -- 1 cup (8 oz) has 8 mg.  Nonfat Greek yogurt -- 1 cup (8 oz) has 7 mg.  Half-and-half cream -- 1 Tbsp has 5 mg. Fats and oils  Cod liver oil -- 1 tablespoon (Tbsp) has 82 mg.  Butter -- 1 Tbsp has 15 mg.  Lard -- 1 Tbsp has 14 mg.  Bacon grease -- 1 Tbsp has 14 mg.  Mayonnaise -- 1 Tbsp has 5-10  mg.  Margarine -- 1 Tbsp has 3-10 mg. Exact amounts of cholesterol in these foods may vary depending on specific ingredients and brands. Foods without cholesterol Most plant-based foods do not have cholesterol unless you combine them with a food that has cholesterol. Foods without cholesterol include:  Grains and cereals.  Vegetables.  Fruits.  Vegetable oils, such as olive, canola, and sunflower oil.  Legumes, such as peas, beans, and lentils.  Nuts and seeds.  Egg whites. Summary  The body needs cholesterol in small amounts, but too much cholesterol can cause damage to the arteries and heart.  Most people should eat less than 200 milligrams (mg) of cholesterol a day. This information is not intended to replace advice given to you by your health care provider. Make sure you discuss any questions you have with your health care provider. Document Revised: 02/28/2017 Document Reviewed: 11/12/2016 Elsevier Patient Education  [place].

## 2019-08-09 NOTE — Chronic Care Management (AMB) (Signed)
Chronic Care Management Pharmacy  Name: Karen Bowman  MRN: [id number] DOB: 1940/05/27  Chief Complaint/ HPI  Karen Bowman,  79 y.o., female presents for their Initial CCM visit with the clinical pharmacist via telephone.  PCP : Jinny Sanders, MD  Their chronic conditions include: hypertension, AFIB, systolic heart failure, IBS, Hemiparesis affecting dominant side as late effect of stroke, hyperlipidemia, CVS (March 2017)  Patient concerns: denies health concerns, prefers herbal/alternative medicine  Office Visits:  12/22/18: [place], stable, cont current meds  Consult Visit:  07/28/19: OAB - Worsened nocturia, drinks most fluids in late afternoon/evening, using Premarin PRN, recommended scheduled use  05/31/19: Cardiology - Eliquis samples provided, pt concerned about loose stools with Lipitor, in AFIB today, increase amio to BID x 1 week, call with HR numbers, continue eliquis, metoprolol, chronic swelling   Allergies  Allergen Reactions  . Ciprofloxacin     REACTION: dizzy  . Ciprofloxacin Nausea And Vomiting  . Hydrochlorothiazide Nausea And Vomiting  . Hydrochlorothiazide W-Triamterene     REACTION: nausea  . Sulfa Antibiotics Nausea And Vomiting  . Sulfonamide Derivatives     REACTION: nausea \\T \ vomiting  . Valsartan     REACTION: angioedema - probable  . Valsartan Nausea And Vomiting   Medications: Outpatient Encounter Medications as of 08/09/2019  Medication Sig  . amiodarone (PACERONE) 200 MG tablet Take 1 tablet (200 mg total) by mouth daily. (Patient taking differently: Take 200 mg by mouth daily. 1/2 tablet daily - per patient)  . amLODipine (NORVASC) 2.5 MG tablet Take 1 tablet (2.5 mg total) by mouth daily.  Marland Kitchen apixaban (ELIQUIS) 5 MG TABS tablet Take 1 tablet (5 mg total) by mouth 2 (two) times daily.  Marland Kitchen atorvastatin (LIPITOR) 40 MG tablet Take 1 tablet (40 mg total) by mouth daily at 6 PM.  . Cholecalciferol 1000 UNITS capsule Take 2,000  Units by mouth daily.   Marland Kitchen CINNAMON PO Take 2 tablets by mouth daily.   . Coenzyme Q10-Fish Oil-Vit E (CO-Q 10 OMEGA-3 FISH OIL) CAPS Take 1 capsule by mouth 2 (two) times daily.  Marland Kitchen conjugated estrogens (PREMARIN) vaginal cream Apply 0.5mg  (pea-sized amount)  just inside the vaginal introitus with a finger-tip on  Monday, Wednesday and Friday nights.  . CRANBERRY PO Take 1 tablet by mouth 2 (two) times daily.   Marland Kitchen ezetimibe (ZETIA) 10 MG tablet Take 1 tablet (10 mg total) by mouth daily.  Marland Kitchen LYSINE PO Take by mouth daily.  . metoprolol tartrate (LOPRESSOR) 25 MG tablet Take 0.5 tablets (12.5 mg total) by mouth 2 (two) times daily.  Marland Kitchen MISC NATURAL PRODUCTS PO Take 1 tablet by mouth daily. Multivitamin-Dr. Talmage Coin  . NON FORMULARY Cell Rebuild Takes 1 tablet BID.  Marland Kitchen NON FORMULARY Canaflex 1 tablet daily.  . NON FORMULARY Epivery Takes 1 tablet bid.  . TURMERIC PO Take by mouth 2 (two) times daily.  Marland Kitchen amiodarone (PACERONE) 200 MG tablet Take 1 tablet (200 mg total) by mouth 2 (two) times daily for 7 days, THEN 1 tablet (200 mg total) daily.  . mupirocin cream (BACTROBAN) 2 % Apply 1 application topically 2 (two) times daily.   No facility-administered encounter medications on file as of 08/09/2019.   Current Diagnosis/Assessment:   Emergency planning/management officer Strain: Low Risk   . Difficulty of Paying Living Expenses: Not hard at all   Goals Addressed            This Visit's Progress   . Pharmacy Care  Plan       CARE PLAN ENTRY  Current Barriers:  . Chronic Disease Management support, education, and care coordination needs related to Hypertension, Hyperlipidemia, and Heart Failure   Hypertension . Pharmacist Clinical Goal(s): o Over the next 30 days, patient will work with PharmD and providers to achieve BP goal <140/90 mmHg . Current regimen:  o Amlodipine 2.5 mg - 1 tablet daily o Metoprolol tartrate 25 mg - 1/2 tablet BID . Interventions: o Recommend checking BP at home 2-3 days per week    . Patient self care activities - Over the next 30 days, patient will: o Check blood pressure 2-3 days per week, document, and provide at future follow up appointment in 1 month (telephone) with pharmacist o Ensure daily salt intake < 2300 mg/day  Hyperlipidemia . Pharmacist Clinical Goal(s): o Over the next 30 days, patient will work with PharmD and providers to achieve LDL goal < 70 . Current regimen:  o Atorvastatin 40 mg - 1 tablet daily o Ezetimibe 10 mg - 1 tablet daily . Interventions: o Continue current medications o Improve dietary choices  . Patient self care activities - Over the next 30 days, patient will: o Limit cholesterol content in foods (see handout)  Heart Failure . Pharmacist Clinical Goal(s): o Over the next 30 days, patient will work with PharmD and providers to prevent heart failure exacerbations due to fluid overload . Current regimen:  o Metoprolol tartrate 25 mg - 1/2 tablet BID . Interventions: o Continue current medications o Counseling on managing weight gain/fluid overload . Patient self care activities - Over the next 30 days, patient will: o Check weight daily, if you gain more than 3 pounds in one day or 5 pounds in one week call your doctor  Initial goal documentation      Hypertension   CMP Latest Ref Rng & Units 12/10/2018 01/13/2018 07/08/2017  Glucose 70 - 99 mg/dL 95 95 93  BUN 6 - 23 mg/dL 22 21 22   Creatinine 0.40 - 1.20 mg/dL 0.84 0.73 0.84  Sodium 135 - 145 mEq/L 142 142 141  Potassium 3.5 - 5.1 mEq/L 4.2 4.0 3.7  Chloride 96 - 112 mEq/L 106 106 106  CO2 19 - 32 mEq/L 30 32 30  Calcium 8.4 - 10.5 mg/dL 9.4 9.6 9.0  Total Protein 6.0 - 8.3 g/dL 7.2 7.2 6.9  Total Bilirubin 0.2 - 1.2 mg/dL 0.6 0.5 0.6  Alkaline Phos 39 - 117 U/L 63 62 52  AST 0 - 37 U/L 26 22 24   ALT 0 - 35 U/L 25 23 21    Office blood pressures are: BP Readings from Last 3 Encounters:  07/28/19 (!) 160/66  05/31/19 (!) 110/56  04/27/19 (!) 164/73   Patient  has failed these meds in the past: HCTZ, valsartan  Patient checks BP at home: unable to find home BP monitor  Patient home BP readings are ranging: reports usually good at home, stressed at office causes it to be elevated  BP goal < 140/90 mmHg Patient is currently on the following medications (unable to assess control - no home BP readings, office reading varied):   Amlodipine 2.5 mg - 1 tablet daily  Metoprolol tartrate 25 mg - 1/2 tablet BID  We discussed:  Recommend checking BP at home 2-3 days per week    Exercise: walks with cane, busy around house caring for husband, cooking, cleaning   Plan: Continue current medications; Monitor home BP 2-3 days per week.  Hyperlipidemia  Lipid Panel     Component Value Date/Time   CHOL 152 12/10/2018 1116   TRIG 70.0 12/10/2018 1116   HDL 59.60 12/10/2018 1116   CHOLHDL 3 12/10/2018 1116   VLDL 14.0 12/10/2018 1116   LDLCALC 79 12/10/2018 1116   LDLDIRECT 206.0 03/08/2013 1218    The ASCVD Risk score (Goff DC Jr., et al., 2013) failed to calculate for the following reasons:   The patient has a prior MI or stroke diagnosis   LDL goal < 70 Patient has failed these meds in past: none  Patient is currently uncontrolled on the following medications:   Atorvastatin 40 mg - 1 tablet daily  Zetia 10 mg - 1 tablet daily  We discussed: confirmed adherence, pt not interested in additional medication   Plan: Continue current medications; Continue to work on heart healthy diet.  AFIB   Patient is currently rhythm/rate controlled. Does not monitor heart rate at home   Patient has failed these meds in past: none reported Patient is currently controlled on the following medications:   Amiodarone 200 mg - 1/2 tablet daily  Apixaban 5 mg - 1 tablet BID  Metoprolol tartrate 25 mg - 1/2 tablet BID  We discussed: reports symptoms once a week for 1-2 hours, usually resolves on its own with resting  Plan: Continue current  medications  Heart Failure   Type: Systolic  Last ejection fraction: 2017 55-60% (improved from 35-40% 2016) NYHA Class: I (no actitivty limitation) AHA HF Stage: B (Heart disease present - no symptoms present) Denies SOB, swelling, limitation of activity  Patient has failed these meds in past: Losartan, HCTZ Patient is currently controlled on the following medications:   Metoprolol tartrate 25 mg - 1/2 tablet BID  We discussed weighing daily; if you gain more than 3 pounds in one day or 5 pounds in one week call your doctor; chronic leg swelling - takes dandelion herbal supplement, reports swellling has improved   Plan: Continue current medications   OAB   Followed by urology  Patient has failed these meds in past: none reported Patient is currently controlled on the following medications:   Premarin vaginal cream - Apply pea sized amount (0.5 mg) on M,W,F evening  We discussed: reports symptoms have improved as long as she remembers to apply three days per week   Plan: Continue current medications  Vaccines   Reviewed and discussed patient's vaccination history. Patient declines all vaccines.  Immunization History  Administered Date(s) Administered  . Td 07/13/2008   Plan: Declines all vaccines. Reports prior hospitalization following flu vaccine in childhood.   Medication Management  OTCs: vitamin D 2000 IU daily, vitamin C, cinnamon (for blood glucose), cranberry, lysine, multivitamin, Cell Rebuild 1 BID, Canaflex 1 daily, Epivery 1 BID, magnesium, CoQ10 with resveratrol/tumeric, [place] supplement for arteries, Boron - gets all vitamins from [employer], [place], [place]  Pharmacy/Benefits: [employer]/[facility] - declines interest in [employer] delivery/coordination services at this time   Adherence: confirms adherence, uses pillbox   Social support: son helps on weekends, neighbors are helpful   Affordability: denies cost concerns  CCM Follow Up: 09/09/2019 at 9:30 AM (1 month  for BP log)  [name], PharmD Clinical Pharmacist [facility] at [facility] [phone]

## 2019-09-09 ENCOUNTER — Telehealth: Payer: Medicare Other

## 2019-10-18 ENCOUNTER — Telehealth: Payer: Self-pay

## 2019-10-18 NOTE — Telephone Encounter (Signed)
Patient contacted the office and is wondering if it is safe for her to have the COVID vaccine given her health history? Please advise.

## 2019-10-19 NOTE — Telephone Encounter (Signed)
Ms. Pietsch notified as instructed by telephone.  Patient states understanding.

## 2019-10-19 NOTE — Telephone Encounter (Signed)
Yes, I see no reason she should not get it and significant benefit from receiving the vaccine.

## 2019-11-17 NOTE — Chronic Care Management (AMB) (Signed)
Chronic Care Management Pharmacy  Name: Karen Bowman  MRN: [id number] DOB: 06-22-1940  Chief Complaint/ HPI  Karen Bowman,  79 y.o., female presents for their Follow-Up CCM visit with the clinical pharmacist via telephone.  PCP : Jinny Sanders, MD  Their chronic conditions include: hypertension, AFIB, systolic heart failure, IBS, Hemiparesis affecting dominant side as late effect of stroke, hyperlipidemia, CVS (March 2017)  Patient concerns: denies health concerns, prefers herbal/alternative medicine  Office Visits:  12/22/18: [place], stable, cont current meds  Consult Visit:  07/28/19: OAB - Worsened nocturia, drinks most fluids in late afternoon/evening, using Premarin PRN, recommended scheduled use  05/31/19: Cardiology - Eliquis samples provided, pt concerned about loose stools with Lipitor, in AFIB today, increase amio to BID x 1 week, call with HR numbers, continue eliquis, metoprolol, chronic swelling   Allergies  Allergen Reactions  . Ciprofloxacin     REACTION: dizzy  . Ciprofloxacin Nausea And Vomiting  . Hydrochlorothiazide Nausea And Vomiting  . Hydrochlorothiazide W-Triamterene     REACTION: nausea  . Sulfa Antibiotics Nausea And Vomiting  . Sulfonamide Derivatives     REACTION: nausea \\T \ vomiting  . Valsartan     REACTION: angioedema - probable  . Valsartan Nausea And Vomiting   Medications: Outpatient Encounter Medications as of 11/22/2019  Medication Sig  . amiodarone (PACERONE) 200 MG tablet Take 1 tablet (200 mg total) by mouth 2 (two) times daily for 7 days, THEN 1 tablet (200 mg total) daily.  Marland Kitchen amiodarone (PACERONE) 200 MG tablet Take 1 tablet (200 mg total) by mouth daily. (Patient taking differently: Take 200 mg by mouth daily. 1/2 tablet daily - per patient)  . amLODipine (NORVASC) 2.5 MG tablet Take 1 tablet (2.5 mg total) by mouth daily.  Marland Kitchen apixaban (ELIQUIS) 5 MG TABS tablet Take 1 tablet (5 mg total) by mouth 2 (two) times  daily.  Marland Kitchen atorvastatin (LIPITOR) 40 MG tablet Take 1 tablet (40 mg total) by mouth daily at 6 PM.  . Cholecalciferol 1000 UNITS capsule Take 2,000 Units by mouth daily.   Marland Kitchen CINNAMON PO Take 2 tablets by mouth daily.   . Coenzyme Q10-Fish Oil-Vit E (CO-Q 10 OMEGA-3 FISH OIL) CAPS Take 1 capsule by mouth 2 (two) times daily.  Marland Kitchen conjugated estrogens (PREMARIN) vaginal cream Apply 0.5mg  (pea-sized amount)  just inside the vaginal introitus with a finger-tip on  Monday, Wednesday and Friday nights.  . CRANBERRY PO Take 1 tablet by mouth 2 (two) times daily.   Marland Kitchen ezetimibe (ZETIA) 10 MG tablet Take 1 tablet (10 mg total) by mouth daily.  Marland Kitchen LYSINE PO Take by mouth daily.  . metoprolol tartrate (LOPRESSOR) 25 MG tablet Take 0.5 tablets (12.5 mg total) by mouth 2 (two) times daily.  Marland Kitchen MISC NATURAL PRODUCTS PO Take 1 tablet by mouth daily. Multivitamin-Dr. Talmage Coin  . mupirocin cream (BACTROBAN) 2 % Apply 1 application topically 2 (two) times daily.  . NON FORMULARY Cell Rebuild Takes 1 tablet BID.  Marland Kitchen NON FORMULARY Canaflex 1 tablet daily.  . NON FORMULARY Epivery Takes 1 tablet bid.  . TURMERIC PO Take by mouth 2 (two) times daily.   No facility-administered encounter medications on file as of 11/22/2019.   Current Diagnosis/Assessment:   Emergency planning/management officer Strain: Low Risk   . Difficulty of Paying Living Expenses: Not hard at all   Goals Addressed            This Visit's Progress   . Pharmacy Care  Plan       CARE PLAN ENTRY  Current Barriers:  . Chronic Disease Management support, education, and care coordination needs related to Hypertension, Hyperlipidemia, and Heart Failure   Hypertension . Pharmacist Clinical Goal(s): o Over the next 30 days, patient will work with PharmD and providers to achieve BP goal <140/90 mmHg . Current regimen:  o Amlodipine 2.5 mg - 1 tablet daily o Metoprolol tartrate 25 mg - 1/2 tablet BID . Interventions: o Recommend checking BP at home 2-3 days per  week  Patient has located blood pressure monitor but has not started checking yet.  . Patient self care activities - Over the next 30 days, patient will: o Check blood pressure 2-3 days per week, document, and provide at future follow up appointment in 1 month (telephone) with pharmacist o Ensure daily salt intake < 2300 mg/day  Hyperlipidemia . Pharmacist Clinical Goal(s): o Over the next 30 days, patient will work with PharmD and providers to achieve LDL goal < 70 . Current regimen:  o Atorvastatin 40 mg - 1 tablet daily o Ezetimibe 10 mg - 1 tablet daily . Interventions: o Continue current medications o Improve dietary choices  . Patient self care activities - Over the next 30 days, patient will: o Limit cholesterol content in foods (see handout)  Heart Failure . Pharmacist Clinical Goal(s): o Over the next 30 days, patient will work with PharmD and providers to prevent heart failure exacerbations due to fluid overload . Current regimen:  o Metoprolol tartrate 25 mg - 1/2 tablet BID . Interventions: o Continue current medications o Counseling on managing weight gain/fluid overload . Patient self care activities - Over the next 30 days, patient will: o Check weight daily, if you gain more than 3 pounds in one day or 5 pounds in one week call your doctor  Please see past updates related to this goal by clicking on the "Past Updates" button in the selected goal       Hypertension   CMP Latest Ref Rng & Units 12/10/2018 01/13/2018 07/08/2017  Glucose 70 - 99 mg/dL 95 95 93  BUN 6 - 23 mg/dL 22 21 22   Creatinine 0.40 - 1.20 mg/dL 0.84 0.73 0.84  Sodium 135 - 145 mEq/L 142 142 141  Potassium 3.5 - 5.1 mEq/L 4.2 4.0 3.7  Chloride 96 - 112 mEq/L 106 106 106  CO2 19 - 32 mEq/L 30 32 30  Calcium 8.4 - 10.5 mg/dL 9.4 9.6 9.0  Total Protein 6.0 - 8.3 g/dL 7.2 7.2 6.9  Total Bilirubin 0.2 - 1.2 mg/dL 0.6 0.5 0.6  Alkaline Phos 39 - 117 U/L 63 62 52  AST 0 - 37 U/L 26 22 24   ALT 0 -  35 U/L 25 23 21    Office blood pressures are: BP Readings from Last 3 Encounters:  07/28/19 (!) 160/66  05/31/19 (!) 110/56  04/27/19 (!) 164/73   Patient has failed these meds in the past: HCTZ, valsartan  Patient checks BP at home: unable to find home BP monitor  Patient home BP readings are ranging: reports usually good at home, stressed at office causes it to be elevated  BP goal < 140/90 mmHg Patient is currently on the following medications (unable to assess control - no home BP readings, office reading varied):   Amlodipine 2.5 mg - 1 tablet daily  Metoprolol tartrate 25 mg - 1/2 tablet BID  We discussed:  Recommend checking BP at home 2-3 days per week  Exercise: walks with cane, busy around house caring for husband, cooking, cleaning   Update 11/22/2019 - Patient has been sick with diarrhea since second COVID shot yesterday afternoon. Was feeling fine before shot. She is working to stay hydrated. Patient has not been taking blood pressure at home. She states her blood pressure is normally "even keel" except when at doctors office or stressed. Patient plans to be more mindful and begin checking blood pressure. She has located her blood pressure monitor since initial visit.   Plan: Continue current medications; Monitor home BP 2-3 days per week.  Hyperlipidemia   Lipid Panel     Component Value Date/Time   CHOL 152 12/10/2018 1116   TRIG 70.0 12/10/2018 1116   HDL 59.60 12/10/2018 1116   CHOLHDL 3 12/10/2018 1116   VLDL 14.0 12/10/2018 1116   LDLCALC 79 12/10/2018 1116   LDLDIRECT 206.0 03/08/2013 1218    The ASCVD Risk score (Goff DC Jr., et al., 2013) failed to calculate for the following reasons:   The patient has a prior MI or stroke diagnosis   LDL goal < 70 Patient has failed these meds in past: none  Patient is currently uncontrolled on the following medications:   Atorvastatin 40 mg - 1 tablet daily  Zetia 10 mg - 1 tablet daily  We discussed:  confirmed adherence, pt not interested in additional medication   Plan: Continue current medications; Continue to work on heart healthy diet.  AFIB   Patient is currently rhythm/rate controlled. Does not monitor heart rate at home   Patient has failed these meds in past: none reported Patient is currently controlled on the following medications:   Amiodarone 200 mg - 1/2 tablet daily  Apixaban 5 mg - 1 tablet BID  Metoprolol tartrate 25 mg - 1/2 tablet BID  We discussed: reports symptoms once a week for 1-2 hours, usually resolves on its own with resting  Plan: Continue current medications  Heart Failure   Type: Systolic  Last ejection fraction: 2017 55-60% (improved from 35-40% 2016) NYHA Class: I (no actitivty limitation) AHA HF Stage: B (Heart disease present - no symptoms present) Denies SOB, swelling, limitation of activity  Patient has failed these meds in past: Losartan, HCTZ Patient is currently controlled on the following medications:   Metoprolol tartrate 25 mg - 1/2 tablet BID  We discussed weighing daily; if you gain more than 3 pounds in one day or 5 pounds in one week call your doctor; chronic leg swelling - takes dandelion herbal supplement, reports swellling has improved   Update 11/22/2019 - Patient denies weighing daily. She feels that she has lost weight while caring for her husband. She stays busy with taking care of her. She states most recent weight was 124 lbs. Denies swelling.   Plan: Continue current medications   OAB   Followed by urology  Patient has failed these meds in past: none reported Patient is currently controlled on the following medications:   Premarin vaginal cream - Apply pea sized amount (0.5 mg) on M,W,F evening  We discussed: reports symptoms have improved as long as she remembers to apply three days per week   Plan: Continue current medications  Vaccines   Reviewed and discussed patient's vaccination history. Patient  declines all vaccines.  Immunization History  Administered Date(s) Administered  . Td 07/13/2008   Plan: Declines all vaccines. Reports prior hospitalization following flu vaccine in childhood.   Update 11/22/2019 - Patient had second COVID vaccine yesterday and  very ill today. It made her very sick and her husband is fine. We discussed staying hydrated and contacting provider with any further concerns. Patient did not want vaccine but her sons encouraged her to get vaccinated.  Medication Management  OTCs: vitamin D 2000 IU daily, vitamin C, cinnamon (for blood glucose), cranberry, lysine, multivitamin, Cell Rebuild 1 BID, Canaflex 1 daily, Epivery 1 BID, magnesium, CoQ10 with resveratrol/tumeric, [place] supplement for arteries, [place] - gets all vitamins from [employer], [place], [place]  Pharmacy/Benefits: [employer]/[facility] - declines interest in [employer] delivery/coordination services at this time   Adherence: confirms adherence, uses pillbox   Social support: son helps on weekends, neighbors are helpful   Affordability: denies cost concerns  CCM Follow Up: 05/2020  Sherre Poot, PharmD, [facility] Clinical Pharmacist [facility] [phone] (office) [phone] (mobile)

## 2019-11-22 ENCOUNTER — Ambulatory Visit: Payer: Medicare Other

## 2019-11-22 ENCOUNTER — Other Ambulatory Visit: Payer: Self-pay

## 2019-11-22 DIAGNOSIS — I10 Essential (primary) hypertension: Secondary | ICD-10-CM

## 2019-11-22 DIAGNOSIS — I5022 Chronic systolic (congestive) heart failure: Secondary | ICD-10-CM

## 2019-11-23 ENCOUNTER — Telehealth: Payer: Medicare Other

## 2019-11-23 NOTE — Patient Instructions (Signed)
Visit Information  Goals Addressed            This Visit's Progress    Pharmacy Care Plan       CARE PLAN ENTRY  Current Barriers:   Chronic Disease Management support, education, and care coordination needs related to Hypertension, Hyperlipidemia, and Heart Failure   Hypertension  Pharmacist Clinical Goal(s): o Over the next 30 days, patient will work with PharmD and providers to achieve BP goal <140/90 mmHg  Current regimen:  o Amlodipine 2.5 mg - 1 tablet daily o Metoprolol tartrate 25 mg - 1/2 tablet BID  Interventions: o Recommend checking BP at home 2-3 days per week  Patient has located blood pressure monitor but has not started checking yet.   Patient self care activities - Over the next 30 days, patient will: o Check blood pressure 2-3 days per week, document, and provide at future follow up appointment in 1 month (telephone) with pharmacist o Ensure daily salt intake < 2300 mg/day  Hyperlipidemia  Pharmacist Clinical Goal(s): o Over the next 30 days, patient will work with PharmD and providers to achieve LDL goal < 70  Current regimen:  o Atorvastatin 40 mg - 1 tablet daily o Ezetimibe 10 mg - 1 tablet daily  Interventions: o Continue current medications o Improve dietary choices   Patient self care activities - Over the next 30 days, patient will: o Limit cholesterol content in foods (see handout)  Heart Failure  Pharmacist Clinical Goal(s): o Over the next 30 days, patient will work with PharmD and providers to prevent heart failure exacerbations due to fluid overload  Current regimen:  o Metoprolol tartrate 25 mg - 1/2 tablet BID  Interventions: o Continue current medications o Counseling on managing weight gain/fluid overload  Patient self care activities - Over the next 30 days, patient will: o Check weight daily, if you gain more than 3 pounds in one day or 5 pounds in one week call your doctor  Please see past updates related to this  goal by clicking on the "Past Updates" button in the selected goal        The patient verbalized understanding of instructions provided today and declined a print copy of patient instruction materials.   Telephone follow up appointment with pharmacy team member scheduled for: 05/2020  Sherre Poot, PharmD, [facility] Clinical Pharmacist [facility] [phone] (office) [phone] (mobile)

## 2019-11-25 ENCOUNTER — Other Ambulatory Visit: Payer: Self-pay | Admitting: Cardiovascular Disease

## 2019-11-26 ENCOUNTER — Telehealth: Payer: Self-pay | Admitting: Cardiovascular Disease

## 2019-11-26 NOTE — Telephone Encounter (Signed)
*  STAT* If patient is at the pharmacy, call can be transferred to refill team.   1. Which medications need to be refilled? (please list name of each medication and dose if known) amiodarone 200mg   2. Which pharmacy/location (including street and city if local pharmacy) is medication to be sent to? [facility] [address] [facility] [place]  3. Do they need a 30 day or 90 day supply? 90  Patient is out of this medication

## 2019-11-26 NOTE — Telephone Encounter (Signed)
Refill authorized earlier today.

## 2019-12-11 NOTE — Progress Notes (Signed)
Cardiology Office Note  Date:  12/13/2019   ID:  Karen Bowman, DOB December 25, 1940, MRN [id number]  PCP:  Jinny Sanders, MD   Chief Complaint  Patient presents with  . OTher    6 month follow up. Med reviewed verbally with patient.     HPI:  79 year old woman with history of  paroxysmal atrial fibrillation,  hospitalization July 5 with discharge 10/20/2014 for atrial fibrillation,  discharged on amiodarone, anticoagulation, digoxin, metoprolol.  History of hyperlipidemia, total cholesterol 280 stroke March 2017 Moderate to severe bilateral proximal PCA stenoses. Mild right cavernous and supraclinoid ICA stenosis. She presents today for follow-up of her atrial fibrillation   Presents with son and husband in wheelchair Lots of stress, taking care of husband at home Finds herself burning out Husband sleeps much of day Incontinence issues, she finds it very difficult to keep cleaning up after him Some help from son on weekends but both sons are gone during the week She has been reluctant to have additional help in the house They previously had some help but husband was less receptive  She is getting weaker, no regular exercise program, reports she is losing weight, missing meals not eating much Attributes this to the stress Long discussion concerning how her health is being affected by his needs  She feels he is much worse in the past 6 months in terms of his dementia She has some ankle swelling She is using a cane for walking longer distances  Denies any tachycardia palpitations  EKG personally reviewed by myself on todays visit NSR rate 56 bpm, no ST or t wave changes Prior office visit was atrial fibrillation   Lab work reviewed Lab Results  Component Value Date   CHOL 152 12/10/2018   HDL 59.60 12/10/2018   LDLCALC 79 12/10/2018   TRIG 70.0 12/10/2018    Other past medical hx Echo 05/2015: EF 55 to 60%  Neck CT  05/2015 IMPRESSION: 1. Moderate to severe  bilateral proximal PCA stenoses. 2. Mild right cavernous and supraclinoid ICA stenosis.  Other past medical hx reviewed Hospital records were reviewed from March 2017, right side deficits upper extremity, lower extremity, speech deficits TPA was not given CT scan of the head and neck documenting diffuse PAD Discharged with eliquis and aspirin 81 mg daily She has completed rehabilitation, has been home for approximately one month, Reports improving deficits, able to floss her teeth, brush her hair,  graduated to a cane from a walker Denies any tachycardia or palpitations concerning for arrhythmia  Notes indicate TEE with cardioversion 10/18/2014 which was unsuccessful   PMH:   has a past medical history of Acute heart failure ([place]), Anxiety, Arrhythmia, Arthritis, Atrial fibrillation ([place]), Heart murmur, Hyperlipemia, Hypertension, and Stroke ([place]).  PSH:    Past Surgical History:  Procedure Laterality Date  . ABDOMINAL HYSTERECTOMY    . APPENDECTOMY    . BLADDER SUSPENSION    . ELECTROPHYSIOLOGIC STUDY N/A 10/18/2014   Procedure: CARDIOVERSION;  Surgeon: Thayer Headings, MD;  Location: [facility] ORS;  Service: Cardiovascular;  Laterality: N/A;  . TEE WITHOUT CARDIOVERSION N/A 10/18/2014   Procedure: TRANSESOPHAGEAL ECHOCARDIOGRAM (TEE);  Surgeon: Thayer Headings, MD;  Location: [facility] ORS;  Service: Cardiovascular;  Laterality: N/A;  . TONSILLECTOMY    . TUMOR REMOVAL     right arm    Current Outpatient Medications  Medication Sig Dispense Refill  . amiodarone (PACERONE) 200 MG tablet Take 100 mg by mouth daily.    Marland Kitchen amLODipine (  NORVASC) 2.5 MG tablet Take 1 tablet (2.5 mg total) by mouth daily. 90 tablet 3  . apixaban (ELIQUIS) 5 MG TABS tablet Take 1 tablet (5 mg total) by mouth 2 (two) times daily. 180 tablet 3  . atorvastatin (LIPITOR) 40 MG tablet Take 1 tablet (40 mg total) by mouth daily at 6 PM. 90 tablet 3  . Cholecalciferol 1000 UNITS capsule Take 2,000 Units by mouth daily.      Marland Kitchen CINNAMON PO Take 2 tablets by mouth daily.     . Coenzyme Q10-Fish Oil-Vit E (CO-Q 10 OMEGA-3 FISH OIL) CAPS Take 1 capsule by mouth 2 (two) times daily.    Marland Kitchen conjugated estrogens (PREMARIN) vaginal cream Apply 0.5mg  (pea-sized amount)  just inside the vaginal introitus with a finger-tip on  Monday, Wednesday and Friday nights. 30 g 12  . CRANBERRY PO Take 1 tablet by mouth 2 (two) times daily.     Marland Kitchen ezetimibe (ZETIA) 10 MG tablet Take 1 tablet (10 mg total) by mouth daily. 90 tablet 3  . LYSINE PO Take by mouth daily.    . metoprolol tartrate (LOPRESSOR) 25 MG tablet Take 0.5 tablets (12.5 mg total) by mouth 2 (two) times daily. 90 tablet 3  . MISC NATURAL PRODUCTS PO Take 1 tablet by mouth daily. Multivitamin-Dr. Talmage Coin    . mupirocin cream (BACTROBAN) 2 % Apply 1 application topically 2 (two) times daily. 15 g 0  . NON FORMULARY Cell Rebuild Takes 1 tablet BID.    Marland Kitchen NON FORMULARY Canaflex 1 tablet daily.    . NON FORMULARY Epivery Takes 1 tablet bid.    . TURMERIC PO Take by mouth 2 (two) times daily.     No current facility-administered medications for this visit.    Allergies:   Ciprofloxacin, Ciprofloxacin, Hydrochlorothiazide, Hydrochlorothiazide w-triamterene, Sulfa antibiotics, Sulfonamide derivatives, Valsartan, and Valsartan   Social History:  The patient  reports that she has never smoked. She has never used smokeless tobacco. She reports that she does not drink alcohol and does not use drugs.   Family History:   family history includes Cancer in her father; Diabetes in her maternal aunt; Heart disease in her mother; Osteoporosis in her mother; Stroke in her mother.    Review of Systems: Review of Systems  Constitutional: Positive for malaise/fatigue.  HENT: Negative.   Respiratory: Negative.   Cardiovascular: Negative.   Gastrointestinal: Negative.   Musculoskeletal: Negative.   Neurological: Negative.   Psychiatric/Behavioral: Negative.   All other systems  reviewed and are negative.   PHYSICAL EXAM: VS:  BP 134/72 (BP Location: Left Arm, Patient Position: Sitting, Cuff Size: Normal)   Pulse (!) 56   Ht 5\' 5"  (1.651 m)   Wt 120 lb 6 oz (54.6 kg)   LMP  (LMP Unknown)   SpO2 97%   BMI 20.03 kg/m  , BMI Body mass index is 20.03 kg/m. Constitutional:  oriented to person, place, and time. No distress.  HENT:  Head: Grossly normal Eyes:  no discharge. No scleral icterus.  Neck: No JVD, no carotid bruits  Cardiovascular: Regular rate and rhythm, no murmurs appreciated Pulmonary/Chest: Clear to auscultation bilaterally, no wheezes or rails Abdominal: Soft.  no distension.  no tenderness.  Musculoskeletal: Normal range of motion Neurological:  normal muscle tone. Coordination normal. No atrophy Skin: Skin warm and dry Psychiatric: normal affect, pleasant   Recent Labs: No results found for requested labs within last 8760 hours.    Lipid Panel Lab Results  Component Value  Date   CHOL 152 12/10/2018   HDL 59.60 12/10/2018   LDLCALC 79 12/10/2018   TRIG 70.0 12/10/2018    Wt Readings from Last 3 Encounters:  12/13/19 120 lb 6 oz (54.6 kg)  07/28/19 124 lb (56.2 kg)  05/31/19 128 lb 8 oz (58.3 kg)      ASSESSMENT AND PLAN:  Paroxysmal atrial fibrillation (HCC) - Plan: EKG 12-Lead amio 100 once a day Continue Eliquis, metoprolol Recommend she call us for tachypalpitations  HYPERLIPIDEMIA Cholesterol is at goal on the current lipid regimen. No changes to the medications were made.  Cerebrovascular accident (CVA) due to thrombosis of left middle cerebral artery (HCC) Prior history of stroke, paroxysmal atrial fibrillation Known cerebrovascular disease On eliquis , importance of compliance  Gait instability Needs exercise  Long discussion concerning various strategies Son also encouraging her to walk  Adjustment disorder Severe stress taking care of husband Discussed strategies, suggested they reach out to various  support groups Needs help at home   Total encounter time more than 25 minutes  Greater than 50% was spent in counseling and coordination of care with the patient   No orders of the defined types were placed in this encounter.    Signed, Esmond Plants, M.D., Ph.D. 12/13/2019  [place], [place]

## 2019-12-13 ENCOUNTER — Other Ambulatory Visit: Payer: Self-pay

## 2019-12-13 ENCOUNTER — Ambulatory Visit: Payer: Medicare Other | Admitting: Cardiovascular Disease

## 2019-12-13 ENCOUNTER — Encounter: Payer: Self-pay | Admitting: Cardiovascular Disease

## 2019-12-13 VITALS — BP 134/72 | HR 56 | Ht 65.0 in | Wt 120.4 lb

## 2019-12-13 DIAGNOSIS — I63312 Cerebral infarction due to thrombosis of left middle cerebral artery: Secondary | ICD-10-CM | POA: Diagnosis not present

## 2019-12-13 DIAGNOSIS — I5022 Chronic systolic (congestive) heart failure: Secondary | ICD-10-CM

## 2019-12-13 DIAGNOSIS — I480 Paroxysmal atrial fibrillation: Secondary | ICD-10-CM | POA: Diagnosis not present

## 2019-12-13 DIAGNOSIS — I10 Essential (primary) hypertension: Secondary | ICD-10-CM | POA: Diagnosis not present

## 2019-12-13 DIAGNOSIS — E782 Mixed hyperlipidemia: Secondary | ICD-10-CM

## 2019-12-13 NOTE — Patient Instructions (Signed)
Medication Instructions:  No changes  If you need a refill on your cardiac medications before your next appointment, please call your pharmacy.    Lab work: No new labs needed   If you have labs (blood work) drawn today and your tests are completely normal, you will receive your results only by: . MyChart Message (if you have MyChart) OR . A paper copy in the mail If you have any lab test that is abnormal or we need to change your treatment, we will call you to review the results.   Testing/Procedures: No new testing needed   Follow-Up: At [facility], you and your health needs are our priority.  As part of our continuing mission to provide you with exceptional heart care, we have created designated Provider Care Teams.  These Care Teams include your primary Cardiologist (physician) and Advanced Practice Providers (APPs -  Physician Assistants and Nurse Practitioners) who all work together to provide you with the care you need, when you need it.  . You will need a follow up appointment in 6 months  . Providers on your designated Care Team:   . Christopher Berge, NP . Ryan Dunn, PA-C . Jacquelyn Visser, PA-C  Any Other Special Instructions Will Be Listed Below (If Applicable).  COVID-19 Vaccine Information can be found at: [email or web address] For questions related to vaccine distribution or appointments, please email [email or web address] or call [phone].     

## 2020-04-26 ENCOUNTER — Telehealth: Payer: Self-pay

## 2020-04-26 NOTE — Chronic Care Management (AMB) (Signed)
Chronic Care Management Pharmacy Assistant   Name: Karen Bowman  MRN: [id number] DOB: 14-Mar-1941  Reason for Encounter: Disease State- Hypertension  Patient Questions:  1.  Have you seen any other providers since your last visit? Yes 12/13/19- Dr. Ida Bowman- Cardiology   2.  Any changes in your medicines or health? Yes 12/13/19 Dr. Rockey Bowman decreased Amiodarone from 200 mg daily to 100 mg daily.    PCP : Karen Sanders, MD  Allergies:   Allergies  Allergen Reactions  . Ciprofloxacin     REACTION: dizzy  . Ciprofloxacin Nausea And Vomiting  . Hydrochlorothiazide Nausea And Vomiting  . Hydrochlorothiazide W-Triamterene     REACTION: nausea  . Sulfa Antibiotics Nausea And Vomiting  . Sulfonamide Derivatives     REACTION: nausea \\T \ vomiting  . Valsartan     REACTION: angioedema - probable  . Valsartan Nausea And Vomiting    Medications: Outpatient Encounter Medications as of 04/26/2020  Medication Sig  . amiodarone (PACERONE) 200 MG tablet Take 100 mg by mouth daily.  Marland Kitchen amLODipine (NORVASC) 2.5 MG tablet Take 1 tablet (2.5 mg total) by mouth daily.  Marland Kitchen apixaban (ELIQUIS) 5 MG TABS tablet Take 1 tablet (5 mg total) by mouth 2 (two) times daily.  Marland Kitchen atorvastatin (LIPITOR) 40 MG tablet Take 1 tablet (40 mg total) by mouth daily at 6 PM.  . Cholecalciferol 1000 UNITS capsule Take 2,000 Units by mouth daily.   Marland Kitchen CINNAMON PO Take 2 tablets by mouth daily.   . Coenzyme Q10-Fish Oil-Vit E (CO-Q 10 OMEGA-3 FISH OIL) CAPS Take 1 capsule by mouth 2 (two) times daily.  Marland Kitchen conjugated estrogens (PREMARIN) vaginal cream Apply 0.5mg  (pea-sized amount)  just inside the vaginal introitus with a finger-tip on  Monday, Wednesday and Friday nights.  . CRANBERRY PO Take 1 tablet by mouth 2 (two) times daily.   Marland Kitchen ezetimibe (ZETIA) 10 MG tablet Take 1 tablet (10 mg total) by mouth daily.  Marland Kitchen LYSINE PO Take by mouth daily.  . metoprolol tartrate (LOPRESSOR) 25 MG tablet Take 0.5 tablets (12.5  mg total) by mouth 2 (two) times daily.  Marland Kitchen MISC NATURAL PRODUCTS PO Take 1 tablet by mouth daily. Multivitamin-Dr. Talmage Bowman  . mupirocin cream (BACTROBAN) 2 % Apply 1 application topically 2 (two) times daily.  . NON FORMULARY Cell Rebuild Takes 1 tablet BID.  Marland Kitchen NON FORMULARY Canaflex 1 tablet daily.  . NON FORMULARY Epivery Takes 1 tablet bid.  . TURMERIC PO Take by mouth 2 (two) times daily.   No facility-administered encounter medications on file as of 04/26/2020.    Current Diagnosis: Patient Active Problem List   Diagnosis Date Noted  . Lymphedema 12/08/2016  . Chronic venous insufficiency 10/24/2016  . Urinary incontinence 06/20/2016  . History of CVA (cerebrovascular accident) 06/19/2016  . Eczema 03/22/2016  . Chronic anticoagulation 09/11/2015  . HLD (hyperlipidemia) 09/11/2015  . Muscle spasticity   . Hemiparesis affecting dominant side as late effect of stroke (Karen Bowman)   . Gait disturbance, post-stroke   . Chronic systolic congestive heart failure ([place])   . Counseling regarding end of life decision making 02/02/2015  . Atrophic vaginitis 12/23/2014  . Urethral caruncle 12/23/2014  . Cystocele, midline 12/23/2014  . Bradycardia 12/12/2014  . Junctional rhythm 12/09/2014  . Paroxysmal atrial fibrillation ([place]) 10/14/2014  . Irritable bowel syndrome (IBS) 04/09/2011  . POSTMENOPAUSAL STATUS 02/02/2009  . HYPERTENSION, BENIGN ESSENTIAL 12/26/2006  . History of rheumatoid arthritis 12/26/2006   Reviewed chart  prior to disease state call. Spoke with patient regarding BP  Recent Office Vitals: BP Readings from Last 3 Encounters:  12/13/19 134/72  07/28/19 (!) 160/66  05/31/19 (!) 110/56   Pulse Readings from Last 3 Encounters:  12/13/19 (!) 56  07/28/19 66  05/31/19 (!) 123    Wt Readings from Last 3 Encounters:  12/13/19 120 lb 6 oz (54.6 kg)  07/28/19 124 lb (56.2 kg)  05/31/19 128 lb 8 oz (58.3 kg)     Kidney Function Lab Results  Component Value Date/Time    CREATININE 0.84 12/10/2018 11:16 AM   CREATININE 0.73 01/13/2018 10:09 AM   GFR 65.89 12/10/2018 11:16 AM   GFRNONAA >60 07/04/2015 03:53 AM   GFRAA >60 07/04/2015 03:53 AM    BMP Latest Ref Rng & Units 12/10/2018 01/13/2018 07/08/2017  Glucose 70 - 99 mg/dL 95 95 93  BUN 6 - 23 mg/dL 22 21 22   Creatinine 0.40 - 1.20 mg/dL 0.84 0.73 0.84  BUN/Creat Ratio 11 - 26 - - -  Sodium 135 - 145 mEq/L 142 142 141  Potassium 3.5 - 5.1 mEq/L 4.2 4.0 3.7  Chloride 96 - 112 mEq/L 106 106 106  CO2 19 - 32 mEq/L 30 32 30  Calcium 8.4 - 10.5 mg/dL 9.4 9.6 9.0   Contacted patient to review blood pressure 04/28/20. Patient stated she was not checking her blood pressure at the time but agreed to do so and agreed to a follow up call 05/01/20. Attempted to contact patient 05/01/20 without success.   . Current antihypertensive regimen:  ? Amlodipine 2.5 mg - 1 tablet daily ? Metoprolol tartrate 25 mg - 1/2 tablet BID  . What recent interventions/DTPs have been made by any provider to improve Blood Pressure control since last CPP Visit: Start to monitor blood pressure.  . Any recent hospitalizations or ED visits since last visit with CPP? No   . What diet changes have been made to improve Blood Pressure Control?  o [name] denies any diet changes.  . What exercise is being done to improve your Blood Pressure Control?  o Patient states she is active but does not do any formal exercise.  Adherence Review: Is the patient currently on ACE/ARB medication? No Does the patient have >5 day gap between last estimated fill dates? CPP to review.  Follow-Up:  Pharmacist Review   Karen Bowman, CPP notified  Karen Bowman, [place] Pharmacy Assistant [phone]

## 2020-05-03 ENCOUNTER — Ambulatory Visit: Payer: Medicare Other

## 2020-05-03 ENCOUNTER — Other Ambulatory Visit: Payer: Self-pay

## 2020-05-03 ENCOUNTER — Telehealth: Payer: Self-pay

## 2020-05-03 DIAGNOSIS — E782 Mixed hyperlipidemia: Secondary | ICD-10-CM

## 2020-05-03 DIAGNOSIS — I10 Essential (primary) hypertension: Secondary | ICD-10-CM

## 2020-05-03 NOTE — Chronic Care Management (AMB) (Signed)
Chronic Care Management Pharmacy  Name: Karen Bowman  MRN: [id number] DOB: 05/13/1940  Chief Complaint/ HPI  Karen Bowman,  80 y.o., female presents for their Follow-Up CCM visit with the clinical pharmacist via telephone.  PCP : Jinny Sanders, MD  Their chronic conditions include: hypertension, AFIB, systolic heart failure, IBS, Hemiparesis affecting dominant side as late effect of stroke, hyperlipidemia, CVA (March 2017)  Patient is due for AWV - coordinated scheduling. She is very worn down by caregiver burden, caring for her husband   Office Visits:  12/22/18: Diona Browner - AWV, stable, cont current meds  Consult Visit:  12/13/19: Cardiology - AFIB - amiodarone 100 mg daily, continue Eliquis, metoprolol. Adjustment disorder, discussed strategies   07/28/19: OAB - Worsened nocturia, drinks most fluids in late afternoon/evening, using Premarin PRN, recommended scheduled use  05/31/19: Cardiology - Eliquis samples provided, pt concerned about loose stools with Lipitor, in AFIB today, increase amio to BID x 1 week, call with HR numbers, continue eliquis, metoprolol, chronic swelling   Allergies  Allergen Reactions  . Ciprofloxacin     REACTION: dizzy  . Ciprofloxacin Nausea And Vomiting  . Hydrochlorothiazide Nausea And Vomiting  . Hydrochlorothiazide W-Triamterene     REACTION: nausea  . Sulfa Antibiotics Nausea And Vomiting  . Sulfonamide Derivatives     REACTION: nausea \\T \ vomiting  . Valsartan     REACTION: angioedema - probable  . Valsartan Nausea And Vomiting   Medications: Outpatient Encounter Medications as of 05/03/2020  Medication Sig  . amiodarone (PACERONE) 200 MG tablet Take 100 mg by mouth daily.  Marland Kitchen amLODipine (NORVASC) 2.5 MG tablet Take 1 tablet (2.5 mg total) by mouth daily.  Marland Kitchen apixaban (ELIQUIS) 5 MG TABS tablet Take 1 tablet (5 mg total) by mouth 2 (two) times daily.  Marland Kitchen atorvastatin (LIPITOR) 40 MG tablet Take 1 tablet (40 mg total) by mouth daily  at 6 PM.  . Cholecalciferol 1000 UNITS capsule Take 2,000 Units by mouth daily.   Marland Kitchen CINNAMON PO Take 2 tablets by mouth daily.   . Coenzyme Q10-Fish Oil-Vit E (CO-Q 10 OMEGA-3 FISH OIL) CAPS Take 1 capsule by mouth 2 (two) times daily.  Marland Kitchen conjugated estrogens (PREMARIN) vaginal cream Apply 0.5mg  (pea-sized amount)  just inside the vaginal introitus with a finger-tip on  Monday, Wednesday and Friday nights.  . CRANBERRY PO Take 1 tablet by mouth 2 (two) times daily.   Marland Kitchen ezetimibe (ZETIA) 10 MG tablet Take 1 tablet (10 mg total) by mouth daily.  Marland Kitchen LYSINE PO Take by mouth daily.  . metoprolol tartrate (LOPRESSOR) 25 MG tablet Take 0.5 tablets (12.5 mg total) by mouth 2 (two) times daily.  Marland Kitchen MISC NATURAL PRODUCTS PO Take 1 tablet by mouth daily. Multivitamin-Dr. Talmage Coin  . mupirocin cream (BACTROBAN) 2 % Apply 1 application topically 2 (two) times daily.  . NON FORMULARY Cell Rebuild Takes 1 tablet BID.  Marland Kitchen NON FORMULARY Canaflex 1 tablet daily.  . NON FORMULARY Epivery Takes 1 tablet bid.  . TURMERIC PO Take by mouth 2 (two) times daily.   No facility-administered encounter medications on file as of 05/03/2020.   Current Diagnosis/Assessment: Emergency planning/management officer Strain: Low Risk   . Difficulty of Paying Living Expenses: Not hard at all   Goals Addressed            This Visit's Progress   . Pharmacy Care Plan       CARE PLAN ENTRY  Current Barriers:  . Chronic Disease Management  support, education, and care coordination needs related to Hypertension and Hyperlipidemia   Hypertension . Pharmacist Clinical Goal(s): o Over the next 6 months, patient will work with PharmD and providers to achieve BP goal <140/90 mmHg . Current regimen:  o Amlodipine 2.5 mg - 1 tablet daily o Metoprolol tartrate 25 mg - 1/2 tablet BID . Interventions: o Reviewed adherence - patient takes medications as prescribed o Recommend continuing current medications, schedule AWV . Patient self care activities -  Over the next 6 months, patient will: o Continue medications as prescribed  Hyperlipidemia . Pharmacist Clinical Goal(s): o Over the next 6 months, patient will work with PharmD and providers to achieve LDL goal < 70 . Current regimen:  o Atorvastatin 40 mg - 1 tablet daily o Ezetimibe 10 mg - 1 tablet daily . Interventions: o Reviewed adherence - patient takes medications as prescribed o Recommend annual lipid panel  . Patient self care activities - Over the next 30 days, patient will: o Continue medications as prescribed  Please see past updates related to this goal by clicking on the "Past Updates" button in the selected goal       Hypertension   CMP Latest Ref Rng & Units 12/10/2018 01/13/2018 07/08/2017  Glucose 70 - 99 mg/dL 95 95 93  BUN 6 - 23 mg/dL 22 21 22   Creatinine 0.40 - 1.20 mg/dL 0.84 0.73 0.84  Sodium 135 - 145 mEq/L 142 142 141  Potassium 3.5 - 5.1 mEq/L 4.2 4.0 3.7  Chloride 96 - 112 mEq/L 106 106 106  CO2 19 - 32 mEq/L 30 32 30  Calcium 8.4 - 10.5 mg/dL 9.4 9.6 9.0  Total Protein 6.0 - 8.3 g/dL 7.2 7.2 6.9  Total Bilirubin 0.2 - 1.2 mg/dL 0.6 0.5 0.6  Alkaline Phos 39 - 117 U/L 63 62 52  AST 0 - 37 U/L 26 22 24   ALT 0 - 35 U/L 25 23 21    Office blood pressures are: BP Readings from Last 3 Encounters:  12/13/19 134/72  07/28/19 (!) 160/66  05/31/19 (!) 110/56   Patient has failed these meds in the past: HCTZ, valsartan  Patient checks BP at home: has home BP monitor, but does not check often  BP goal < 140/90 mmHg Patient is currently on the following medications:  Amlodipine 2.5 mg - 1 tablet daily  Metoprolol tartrate 25 mg - 1/2 tablet BID  Update 2/2/222 - Has not been able to check BP and right now she doesn't care. She takes all of her medicines. She hopes her BP is fine because she does not have the time to worry about with caring for husband. Refills timely.   Plan: Continue current medications; Recommend social work referral as patient  needs help at home caring for husband.   Hyperlipidemia   Lipid Panel     Component Value Date/Time   CHOL 152 12/10/2018 1116   TRIG 70.0 12/10/2018 1116   HDL 59.60 12/10/2018 1116   CHOLHDL 3 12/10/2018 1116   VLDL 14.0 12/10/2018 1116   LDLCALC 79 12/10/2018 1116   LDLDIRECT 206.0 03/08/2013 1218    The ASCVD Risk score (Goff DC Jr., et al., 2013) failed to calculate for the following reasons:   The patient has a prior MI or stroke diagnosis   LDL goal < 70 Patient has failed these meds in past: none  Patient is currently uncontrolled on the following medications:   Atorvastatin 40 mg - 1 tablet daily  Zetia 10  mg - 1 tablet daily  Update 05/03/20 - Patient due for AWV for lipid panel. She confirms adherence. Refills are timely.   Plan: Continue current medications  AFIB   Lab Results  Component Value Date   CREATININE 0.84 12/10/2018   BUN 22 12/10/2018   GFR 65.89 12/10/2018   GFRNONAA >60 07/04/2015   GFRAA >60 07/04/2015   NA 142 12/10/2018   K 4.2 12/10/2018   CALCIUM 9.4 12/10/2018   CO2 30 12/10/2018   Patient is currently rhythm/rate controlled. Does not monitor heart rate at home   Patient has failed these meds in past: none reported Patient is currently controlled on the following medications:   Amiodarone 200 mg - 1/2 tablet daily  Apixaban 5 mg - 1 tablet BID  Metoprolol tartrate 25 mg - 1/2 tablet BID  Update 05/03/20: Patient saw cardiology September 2021, no med changes made. She is adherent to meds. Eliquis dosing appropriate.  Plan: Continue current medications  Heart Failure   Type: Systolic  Last ejection fraction: 2017 55-60% (improved from 35-40% 2016) NYHA Class: I (no actitivty limitation) AHA HF Stage: B (Heart disease present - no symptoms present) Denies SOB, swelling, limitation of activity  Patient has failed these meds in past: Losartan, HCTZ Patient is currently controlled on the following medications:   Metoprolol  tartrate 25 mg - 1/2 tablet BID  We discussed weighing daily; if you gain more than 3 pounds in one day or 5 pounds in one week call your doctor; chronic leg swelling - takes dandelion herbal supplement, reports swellling has improved   Update 11/22/2019 - Patient denies weighing daily. She feels that she has lost weight while caring for her husband. She stays busy with taking care of her. She states most recent weight was 124 lbs. Denies swelling  No update/changes 05/03/20  Plan: Continue current medications   OAB   Followed by urology  Patient has failed these meds in past: none reported Patient is currently controlled on the following medications:   Premarin vaginal cream - Apply pea sized amount (0.5 mg) on M,W,F evening  We discussed: reports symptoms have improved as long as she remembers to apply three days per week   No update/changes 05/03/20  Plan: Continue current medications  Medication Management  OTCs: vitamin D 2000 IU daily, vitamin C, cinnamon (for blood glucose), cranberry, lysine, multivitamin, Cell Rebuild 1 BID, Canaflex 1 daily, Epivery 1 BID, magnesium, CoQ10 with resveratrol/tumeric, [place] supplement for arteries, Boron - gets all vitamins from [employer], [place], [place]  Pharmacy/Benefits: [employer]/[facility] - declines interest in [employer] delivery/coordination services at this time   Adherence: confirms adherence, uses pillbox   Social support: son helps on weekends, neighbors are helpful   Affordability: denies cost concerns  CCM Follow Up: 6 months, telephone  [name], PharmD Clinical Pharmacist [facility] at [facility] [phone]

## 2020-05-03 NOTE — Telephone Encounter (Signed)
Spoke with patient scheduled CPE and labs 

## 2020-05-03 NOTE — Patient Instructions (Addendum)
Dear [name],  Below is a summary of the goals we discussed during our follow up appointment on May 03, 2020. Please contact me anytime with questions or concerns.   Visit Information  Goals Addressed            This Visit's Progress   . Pharmacy Care Plan       CARE PLAN ENTRY  Current Barriers:  . Chronic Disease Management support, education, and care coordination needs related to Hypertension and Hyperlipidemia   Hypertension . Pharmacist Clinical Goal(s): o Over the next 6 months, patient will work with PharmD and providers to achieve BP goal <140/90 mmHg . Current regimen:  o Amlodipine 2.5 mg - 1 tablet daily o Metoprolol tartrate 25 mg - 1/2 tablet BID . Interventions: o Reviewed adherence - patient takes medications as prescribed o Recommend continuing current medications, schedule AWV . Patient self care activities - Over the next 6 months, patient will: o Continue medications as prescribed  Hyperlipidemia . Pharmacist Clinical Goal(s): o Over the next 6 months, patient will work with PharmD and providers to achieve LDL goal < 70 . Current regimen:  o Atorvastatin 40 mg - 1 tablet daily o Ezetimibe 10 mg - 1 tablet daily . Interventions: o Reviewed adherence - patient takes medications as prescribed o Recommend annual lipid panel  . Patient self care activities - Over the next 30 days, patient will: o Continue medications as prescribed  Please see past updates related to this goal by clicking on the "Past Updates" button in the selected goal        Telephone follow up appointment with pharmacy team member scheduled for: 6 months, telephone (11/01/2020 at 10 AM PHONE)  [name], PharmD Clinical Pharmacist [facility] at [facility] [phone]

## 2020-05-03 NOTE — Telephone Encounter (Signed)
Ms. Tenenbaum last saw Dr. Diona Browner September 2020, could we reach out to schedule an AWV?  Thank you!  [name], PharmD Clinical Pharmacist [facility] at [facility] [phone]

## 2020-05-08 ENCOUNTER — Telehealth: Payer: Self-pay | Admitting: Family Medicine

## 2020-05-08 DIAGNOSIS — E782 Mixed hyperlipidemia: Secondary | ICD-10-CM

## 2020-05-08 DIAGNOSIS — Z1159 Encounter for screening for other viral diseases: Secondary | ICD-10-CM

## 2020-05-08 NOTE — Telephone Encounter (Signed)
-----   Message from Cloyd Stagers, RT sent at 05/03/2020  2:12 PM EST ----- Regarding: Lab Orders for Tuesday 2.15.2022 Please place lab orders for Tuesday 2.15.2022, office visit for physical on Tuesday 2.22.2022 Thank you, Dyke Maes RT(R)

## 2020-05-16 ENCOUNTER — Other Ambulatory Visit: Payer: Self-pay

## 2020-05-16 ENCOUNTER — Other Ambulatory Visit (INDEPENDENT_AMBULATORY_CARE_PROVIDER_SITE_OTHER): Payer: Medicare Other

## 2020-05-16 DIAGNOSIS — E782 Mixed hyperlipidemia: Secondary | ICD-10-CM | POA: Diagnosis not present

## 2020-05-16 DIAGNOSIS — Z1159 Encounter for screening for other viral diseases: Secondary | ICD-10-CM

## 2020-05-16 LAB — COMPREHENSIVE METABOLIC PANEL
ALT: 22 U/L (ref 0–35)
AST: 23 U/L (ref 0–37)
Albumin: 4.2 g/dL (ref 3.5–5.2)
Alkaline Phosphatase: 66 U/L (ref 39–117)
BUN: 23 mg/dL (ref 6–23)
CO2: 30 mEq/L (ref 19–32)
Calcium: 9.2 mg/dL (ref 8.4–10.5)
Chloride: 104 mEq/L (ref 96–112)
Creatinine, Ser: 0.78 mg/dL (ref 0.40–1.20)
GFR: 73.17 mL/min (ref >60.00)
Glucose, Bld: 79 mg/dL (ref 70–99)
Potassium: 3.9 mEq/L (ref 3.5–5.1)
Sodium: 140 mEq/L (ref 135–145)
Total Bilirubin: 0.6 mg/dL (ref 0.2–1.2)
Total Protein: 7.2 g/dL (ref 6.0–8.3)

## 2020-05-16 LAB — LIPID PANEL
Cholesterol: 157 mg/dL (ref 0–200)
HDL: 58.7 mg/dL (ref >39.00)
LDL Cholesterol: 82 mg/dL (ref 0–99)
NonHDL: 98.1
Total CHOL/HDL Ratio: 3
Triglycerides: 82 mg/dL (ref 0.0–149.0)
VLDL: 16.4 mg/dL (ref 0.0–40.0)

## 2020-05-16 NOTE — Progress Notes (Signed)
No critical labs need to be addressed urgently. We will discuss labs in detail at upcoming office visit.   

## 2020-05-17 LAB — HEPATITIS C ANTIBODY
Hepatitis C Ab: NONREACTIVE
SIGNAL TO CUT-OFF: 0.01 (ref <1.00)

## 2020-05-17 NOTE — Progress Notes (Signed)
No critical labs need to be addressed urgently. We will discuss labs in detail at upcoming office visit.   

## 2020-05-23 ENCOUNTER — Ambulatory Visit (INDEPENDENT_AMBULATORY_CARE_PROVIDER_SITE_OTHER): Payer: Medicare Other | Admitting: Family Medicine

## 2020-05-23 ENCOUNTER — Other Ambulatory Visit: Payer: Self-pay

## 2020-05-23 ENCOUNTER — Encounter: Payer: Self-pay | Admitting: Family Medicine

## 2020-05-23 VITALS — BP 122/82 | HR 50 | Temp 98.0°F | Ht 65.0 in | Wt 118.0 lb

## 2020-05-23 DIAGNOSIS — I10 Essential (primary) hypertension: Secondary | ICD-10-CM | POA: Diagnosis not present

## 2020-05-23 DIAGNOSIS — Z0000 Encounter for general adult medical examination without abnormal findings: Secondary | ICD-10-CM

## 2020-05-23 DIAGNOSIS — L989 Disorder of the skin and subcutaneous tissue, unspecified: Secondary | ICD-10-CM

## 2020-05-23 DIAGNOSIS — I5022 Chronic systolic (congestive) heart failure: Secondary | ICD-10-CM

## 2020-05-23 DIAGNOSIS — I480 Paroxysmal atrial fibrillation: Secondary | ICD-10-CM | POA: Diagnosis not present

## 2020-05-23 DIAGNOSIS — E782 Mixed hyperlipidemia: Secondary | ICD-10-CM

## 2020-05-23 DIAGNOSIS — N3941 Urge incontinence: Secondary | ICD-10-CM

## 2020-05-23 NOTE — Assessment & Plan Note (Signed)
Euvolemic. Followed by Dr. Rockey Situ.

## 2020-05-23 NOTE — Assessment & Plan Note (Signed)
On Eliquis anticoagulation.    ON amiodraone, metoprolol.

## 2020-05-23 NOTE — Patient Instructions (Addendum)
Work on regular exercise.  Consider low back stretching as needed.  We will set up a referral to dermatology for facial skin lesion.

## 2020-05-23 NOTE — Assessment & Plan Note (Signed)
Stable, chronic.  Continue current medication.    Atorvastatin 40 mg daily  Zetia 10 mg daily.

## 2020-05-23 NOTE — Progress Notes (Signed)
Patient ID: Karen Bowman, female    DOB: [birth year], 80 y.o.   MRN: [id number]  This visit was conducted in person.  BP 122/82 (BP Location: Left Arm, Patient Position: Sitting, Cuff Size: Normal)   Pulse (!) 50   Temp 98 F (36.7 C) (Temporal)   Ht 5\' 5"  (1.651 m)   Wt 118 lb (53.5 kg)   LMP  (LMP Unknown)   SpO2 99%   BMI 19.64 kg/m    CC:  Chief Complaint  Patient presents with  . Medicare Wellness    Subjective:   HPI: Karen Bowman is a 80 y.o. female presenting on 05/23/2020 for Medicare Wellness  I have personally reviewed the Medicare Annual Wellness questionnaire and have noted 1. The patient's medical and social history 2. Their use of alcohol, tobacco or illicit drugs 3. Their current medications and supplements 4. The patient's functional ability including ADL's, fall risks, home safety risks and hearing or visual             impairment. 5. Diet and physical activities 6. Evidence for depression or mood disorders Cognitive evaluation was performed and recorded on pt medicare questionnaire form. The patients weight, height, BMI and visual acuity have been recorded in the chart  I have made referrals, counseling and provided education to the patient based review of the above and I have provided the pt with a written personalized care plan for preventive services.   Documentation of this information was scanned into the electronic record under the media tab.   Advance directives and end of life planning reviewed in detail with patient and documented in EMR. Patient given handout on advance care directives if needed. HCPOA and living will updated if needed.   Hearing Screening   125Hz  250Hz  500Hz  1000Hz  2000Hz  3000Hz  4000Hz  6000Hz  8000Hz   Right ear:   0 0 0  0    Left ear:   0 0 0  0      Visual Acuity Screening   Right eye Left eye Both eyes  Without correction: 20/30 20/25 20/25   With correction:       Fall Risk  05/23/2020 12/22/2018 07/08/2017  06/18/2016 05/31/2016  Falls in the past year? 1 0 No No Yes  Comment - - - - pt reports 2 falls with injury(bruising)  Number falls in past yr: 0 - - - 2 or more  Injury with Fall? 0 - - - Yes  Risk Factor Category  - - - - -  Risk for fall due to : - - - - Impaired balance/gait;Impaired mobility  Follow up Falls evaluation completed - - - Education provided    [employer] Visit from 12/22/2018 in [place] at [facility] Total Score 0     Having some intermittent pain in left lower back, uses ice pack, topical creams.   Off and on for years.Marland Kitchen occ flares up.  No weakness, no numbness  oN falls prior  Hypertension:   Well controlled on amlodipine  And lopressor   BP Readings from Last 3 Encounters:  05/23/20 122/82  12/13/19 134/72  07/28/19 (!) 160/66  Using medication without problems or lightheadedness: none Chest pain with exertion:none Edema:   Wearing compression hose Short of breath: Average home BPs: Other issues:   Elevated Cholesterol: LDL almost at goal < 70 on atorvastatin 40 mg daily and zetia 10 mg daily. Lab Results  Component Value Date   CHOL 157 05/16/2020  HDL 58.70 05/16/2020   LDLCALC 82 05/16/2020   LDLDIRECT 206.0 03/08/2013   TRIG 82.0 05/16/2020   CHOLHDL 3 05/16/2020  Using medications without problems: Muscle aches:  Diet compliance: Exercise: Other complaints: Hx of CVA, left hemiparesis    CAD, PAD, CHF second degree AV block: followed by Dr. Rockey Situ. On eliquis, amlodipine , amiodarone and metoprolol, aggressive cholesterol control. Euvolemic today. HX of CVA   Relevant past medical, surgical, family and social history reviewed and updated as indicated. Interim medical history since our last visit reviewed. Allergies and medications reviewed and updated. Outpatient Medications Prior to Visit  Medication Sig Dispense Refill  . amiodarone (PACERONE) 200 MG tablet Take 100 mg by mouth daily.    Marland Kitchen amLODipine  (NORVASC) 2.5 MG tablet Take 1 tablet (2.5 mg total) by mouth daily. 90 tablet 3  . apixaban (ELIQUIS) 5 MG TABS tablet Take 1 tablet (5 mg total) by mouth 2 (two) times daily. 180 tablet 3  . atorvastatin (LIPITOR) 40 MG tablet Take 1 tablet (40 mg total) by mouth daily at 6 PM. 90 tablet 3  . Cholecalciferol 1000 UNITS capsule Take 2,000 Units by mouth daily.     Marland Kitchen CINNAMON PO Take 2 tablets by mouth daily.     . Coenzyme Q10-Fish Oil-Vit E (CO-Q 10 OMEGA-3 FISH OIL) CAPS Take 1 capsule by mouth 2 (two) times daily.    Marland Kitchen conjugated estrogens (PREMARIN) vaginal cream Apply 0.5mg  (pea-sized amount)  just inside the vaginal introitus with a finger-tip on  Monday, Wednesday and Friday nights. 30 g 12  . CRANBERRY PO Take 1 tablet by mouth 2 (two) times daily.     Marland Kitchen ezetimibe (ZETIA) 10 MG tablet Take 1 tablet (10 mg total) by mouth daily. 90 tablet 3  . LYSINE PO Take by mouth daily.    . metoprolol tartrate (LOPRESSOR) 25 MG tablet Take 0.5 tablets (12.5 mg total) by mouth 2 (two) times daily. 90 tablet 3  . MISC NATURAL PRODUCTS PO Take 1 tablet by mouth daily. Multivitamin-Dr. Talmage Coin    . mupirocin cream (BACTROBAN) 2 % Apply 1 application topically 2 (two) times daily. 15 g 0  . NON FORMULARY Cell Rebuild Takes 1 tablet BID.    Marland Kitchen NON FORMULARY Canaflex 1 tablet daily.    . NON FORMULARY Epivery Takes 1 tablet bid.    . TURMERIC PO Take by mouth 2 (two) times daily.     No facility-administered medications prior to visit.     Per HPI unless specifically indicated in ROS section below Review of Systems  Constitutional: Negative for fatigue and fever.  HENT: Negative for congestion.   Eyes: Negative for pain.  Respiratory: Negative for cough and shortness of breath.   Cardiovascular: Negative for chest pain, palpitations and leg swelling.  Gastrointestinal: Negative for abdominal pain.  Genitourinary: Negative for dysuria and vaginal bleeding.  Musculoskeletal: Negative for back pain.   Neurological: Negative for syncope, light-headedness and headaches.  Psychiatric/Behavioral: Negative for dysphoric mood.   Objective:  BP 122/82 (BP Location: Left Arm, Patient Position: Sitting, Cuff Size: Normal)   Pulse (!) 50   Temp 98 F (36.7 C) (Temporal)   Ht 5\' 5"  (1.651 m)   Wt 118 lb (53.5 kg)   LMP  (LMP Unknown)   SpO2 99%   BMI 19.64 kg/m   Wt Readings from Last 3 Encounters:  05/23/20 118 lb (53.5 kg)  12/13/19 120 lb 6 oz (54.6 kg)  07/28/19 124 lb (56.2  kg)      Physical Exam Constitutional:      General: She is not in acute distress.Vital signs are normal.     Appearance: Normal appearance. She is well-developed and well-nourished. She is not ill-appearing or toxic-appearing.  HENT:     Head: Normocephalic.     Right Ear: Hearing, tympanic membrane, ear canal and external ear normal.     Left Ear: Hearing, tympanic membrane, ear canal and external ear normal.     Nose: Nose normal.  Eyes:     General: Lids are normal. Lids are everted, no foreign bodies appreciated.     Extraocular Movements: EOM normal.     Conjunctiva/sclera: Conjunctivae normal.     Pupils: Pupils are equal, round, and reactive to light.  Neck:     Thyroid: No thyroid mass or thyromegaly.     Vascular: No carotid bruit.     Trachea: Trachea normal.  Cardiovascular:     Rate and Rhythm: Normal rate and regular rhythm.     Pulses: Intact distal pulses.     Heart sounds: Normal heart sounds, S1 normal and S2 normal. No murmur heard. No gallop.   Pulmonary:     Effort: Pulmonary effort is normal. No respiratory distress.     Breath sounds: Normal breath sounds. No wheezing, rhonchi or rales.  Abdominal:     General: Bowel sounds are normal. There is no distension or abdominal bruit.     Palpations: Abdomen is soft. There is no fluid wave, hepatosplenomegaly or mass.     Tenderness: There is no abdominal tenderness. There is no CVA tenderness, guarding or rebound.     Hernia: No  hernia is present.  Musculoskeletal:     Cervical back: Normal range of motion and neck supple.  Lymphadenopathy:     Cervical: No cervical adenopathy.     Upper Body:  No axillary adenopathy present. Skin:    General: Skin is warm, dry and intact.     Findings: No rash.  Neurological:     Mental Status: She is alert.     Cranial Nerves: No cranial nerve deficit.     Sensory: No sensory deficit.     Deep Tendon Reflexes: Strength normal.  Psychiatric:        Mood and Affect: Mood is not anxious or depressed.        Speech: Speech normal.        Behavior: Behavior normal. Behavior is cooperative.        Cognition and Memory: Cognition and memory normal.        Judgment: Judgment normal.       Results for orders placed or performed in visit on 05/16/20  Hepatitis C antibody  Result Value Ref Range   Hepatitis C Ab NON-REACTIVE NON-REACTI   SIGNAL TO CUT-OFF 0.01 <1.00  Comprehensive metabolic panel  Result Value Ref Range   Sodium 140 135 - 145 mEq/L   Potassium 3.9 3.5 - 5.1 mEq/L   Chloride 104 96 - 112 mEq/L   CO2 30 19 - 32 mEq/L   Glucose, Bld 79 70 - 99 mg/dL   BUN 23 6 - 23 mg/dL   Creatinine, Ser 0.78 0.40 - 1.20 mg/dL   Total Bilirubin 0.6 0.2 - 1.2 mg/dL   Alkaline Phosphatase 66 39 - 117 U/L   AST 23 0 - 37 U/L   ALT 22 0 - 35 U/L   Total Protein 7.2 6.0 - 8.3 g/dL   Albumin  4.2 3.5 - 5.2 g/dL   GFR 73.17 >60.00 mL/min   Calcium 9.2 8.4 - 10.5 mg/dL  Lipid panel  Result Value Ref Range   Cholesterol 157 0 - 200 mg/dL   Triglycerides 82.0 0.0 - 149.0 mg/dL   HDL 58.70 >39.00 mg/dL   VLDL 16.4 0.0 - 40.0 mg/dL   LDL Cholesterol 82 0 - 99 mg/dL   Total CHOL/HDL Ratio 3    NonHDL 98.10     This visit occurred during the SARS-CoV-2 public health emergency.  Safety protocols were in place, including screening questions prior to the visit, additional usage of staff PPE, and extensive cleaning of exam room while observing appropriate contact time as indicated  for disinfecting solutions.   COVID 19 screen:  No recent travel or known exposure to COVID19 The patient denies respiratory symptoms of COVID 19 at this time. The importance of social distancing was discussed today.   Assessment and Plan   The patient's preventative maintenance and recommended screening tests for an annual wellness exam were reviewed in full today. Brought up to date unless services declined.  Counselled on the importance of diet, exercise, and its role in overall health and mortality. The patient's FH and SH was reviewed, including their home life, tobacco status, and drug and alcohol status.   Reviewed prevention.. she does not wish to proceed with any preventative evaluations as below: 05/03/20 DEXA done in 2010.Marland Kitchen Not interested in repeating. Vaccines:Due for PNA and shingles. SE to flu va  ccine in past. She is not interested.  COVID vaccine: 11/21/2019 , , plans to get booster Due for mammogram,never went last year, not interested in repeating. DVE/pap: not indicated, s/p hysterectomy Colon:never sent back ifob 2014, 2015or 2016.Marland Kitchen Refused to complete... no further indicated after age 40.  Problem List Items Addressed This Visit    Chronic systolic congestive heart failure ([place])    Euvolemic. Followed by Dr. Rockey Situ.      HLD (hyperlipidemia)    Stable, chronic.  Continue current medication.    Atorvastatin 40 mg daily  Zetia 10 mg daily.      HYPERTENSION, BENIGN ESSENTIAL (Chronic)    Stable, chronic.  Continue current medication.  Amlodipine 10 mg daily.       Paroxysmal atrial fibrillation ([facility])     On Eliquis anticoagulation.    ON amiodraone, metoprolol.      Urinary incontinence     Improved on Ultranol.. OTC supplement.       Other Visit Diagnoses    Medicare annual wellness visit, subsequent    -  Primary     Referred to derm for persistent irritate facial skin lesion concerning for basal cell Ca.   Completed DMV disability  parking form.. permanent  [name], MD

## 2020-05-23 NOTE — Assessment & Plan Note (Signed)
Improved on Ultranol.. OTC supplement.

## 2020-05-23 NOTE — Assessment & Plan Note (Signed)
Stable, chronic.  Continue current medication.  Amlodipine 10 mg daily 

## 2020-06-06 ENCOUNTER — Other Ambulatory Visit: Payer: Self-pay

## 2020-06-06 ENCOUNTER — Ambulatory Visit (INDEPENDENT_AMBULATORY_CARE_PROVIDER_SITE_OTHER): Payer: Medicare Other | Admitting: Family Medicine

## 2020-06-06 ENCOUNTER — Encounter: Payer: Self-pay | Admitting: Family Medicine

## 2020-06-06 VITALS — BP 140/80 | HR 56 | Temp 97.0°F | Ht 65.0 in | Wt 113.8 lb

## 2020-06-06 DIAGNOSIS — B029 Zoster without complications: Secondary | ICD-10-CM | POA: Diagnosis not present

## 2020-06-06 MED ORDER — VALACYCLOVIR HCL 1 G PO TABS: 1000.0000 mg | ORAL_TABLET | Freq: Three times a day (TID) | ORAL | 0 refills | Status: AC

## 2020-06-06 MED ORDER — LIDOCAINE 5 % EX PTCH: 1.0000 | MEDICATED_PATCH | CUTANEOUS | 0 refills | Status: DC

## 2020-06-06 NOTE — Patient Instructions (Addendum)
#  Pain - Can take tylenol or Ibuprofen for pain relief  - Start Valacyclovir 1000 mg three times per day  Lidocaine patch -- would recommend placing above the rash is pain persists and not responding to tylenol or ibuprofen

## 2020-06-06 NOTE — Progress Notes (Signed)
   Subjective:     Karen Bowman is a 80 y.o. female presenting for Herpes Zoster (R flank. Rash since Saturday. )     HPI   #Shingles - started on Saturday 3/5 not sure what time of day - has had stabbing pain the last few nights - on the right back and trunk - nothing passed the midline - has not wanted to eat - feels like she has leveled off some - is tired - has not been able to sleep 2/2 to pain - has been icing the rash    Review of Systems   Social History   Tobacco Use  Smoking Status Never Smoker  Smokeless Tobacco Never Used        Objective:    BP Readings from Last 3 Encounters:  06/06/20 140/80  05/23/20 122/82  12/13/19 134/72   Wt Readings from Last 3 Encounters:  06/06/20 113 lb 12 oz (51.6 kg)  05/23/20 118 lb (53.5 kg)  12/13/19 120 lb 6 oz (54.6 kg)    BP 140/80   Pulse (!) 56   Temp (!) 97 F (36.1 C) (Temporal)   Ht 5\' 5"  (1.651 m)   Wt 113 lb 12 oz (51.6 kg)   LMP  (LMP Unknown)   SpO2 99%   BMI 18.93 kg/m    Physical Exam Constitutional:      General: She is not in acute distress.    Appearance: She is well-developed. She is not diaphoretic.  HENT:     Right Ear: External ear normal.     Left Ear: External ear normal.  Eyes:     Conjunctiva/sclera: Conjunctivae normal.  Cardiovascular:     Rate and Rhythm: Normal rate.  Pulmonary:     Effort: Pulmonary effort is normal.  Musculoskeletal:     Cervical back: Neck supple.  Skin:    General: Skin is warm and dry.     Capillary Refill: Capillary refill takes less than 2 seconds.     Comments: Vesicular rash with erythematous base alone the right T12 and L1 dermatome. Some crusted and ulcer lesions present  Neurological:     Mental Status: She is alert. Mental status is at baseline.  Psychiatric:        Mood and Affect: Mood normal.        Behavior: Behavior normal.           Assessment & Plan:   Problem List Items Addressed This Visit   None   Visit  Diagnoses    Herpes zoster without complication    -  Primary   Relevant Medications   valACYclovir (VALTREX) 1000 MG tablet   lidocaine (LIDODERM) 5 %     Discussed that treatment is borderline as it is possible it has been >72 hours but will start anti-viral as above.   Ibuprofen/tylenol for pain Lidocaine if no improvement   Return if symptoms worsen or fail to improve.  Lesleigh Noe, MD  This visit occurred during the SARS-CoV-2 public health emergency.  Safety protocols were in place, including screening questions prior to the visit, additional usage of staff PPE, and extensive cleaning of exam room while observing appropriate contact time as indicated for disinfecting solutions.

## 2020-06-08 ENCOUNTER — Telehealth: Payer: Self-pay

## 2020-06-08 NOTE — Telephone Encounter (Signed)
Approved for 1 year from 06-07-20.

## 2020-06-14 ENCOUNTER — Telehealth (INDEPENDENT_AMBULATORY_CARE_PROVIDER_SITE_OTHER): Payer: Medicare Other | Admitting: Internal Medicine

## 2020-06-14 DIAGNOSIS — J069 Acute upper respiratory infection, unspecified: Secondary | ICD-10-CM

## 2020-06-14 DIAGNOSIS — B029 Zoster without complications: Secondary | ICD-10-CM

## 2020-06-14 NOTE — Progress Notes (Unsigned)
Subjective:    Patient ID: Karen Bowman, female    DOB: February 15, 1941, 80 y.o.   MRN: [id number]  DOS:  06/14/2020 Type of visit - description: Virtual Visit via Telephone    I connected with above mentioned patient  by telephone and verified that I am speaking with the correct person using two identifiers.  THIS ENCOUNTER IS A VIRTUAL VISIT DUE TO COVID-19 - PATIENT WAS NOT SEEN IN THE OFFICE. PATIENT HAS CONSENTED TO VIRTUAL VISIT / TELEMEDICINE VISIT   Location of patient: home  Location of provider: office  Persons participating in the virtual visit: patient, provider   I discussed the limitations, risks, security and privacy concerns of performing an evaluation and management service by telephone and the availability of in person appointments. I also discussed with the patient that there may be a patient responsible charge related to this service. The patient expressed understanding and agreed to proceed.  Acute The patient was recently diagnosed with shingles, she is finishing valacyclovir today, wonders if she could have a refill. The rash is actually better, drying up, not very painful.  Also mentioned that she developed a cold about 4 to 5 days ago: Nasal congestion, sinus congestion. Cough. She denies fever chills No chest pain no difficulty breathing Overall cold symptoms have improved in the last couple of days, "a lot better now".    Review of Systems See above   Past Medical History:  Diagnosis Date  . Acute heart failure ([place])   . Anxiety   . Arrhythmia   . Arthritis   . Atrial fibrillation ([place])   . Heart murmur   . Hyperlipemia   . Hypertension   . Stroke [facility])     Past Surgical History:  Procedure Laterality Date  . ABDOMINAL HYSTERECTOMY    . APPENDECTOMY    . BLADDER SUSPENSION    . ELECTROPHYSIOLOGIC STUDY N/A 10/18/2014   Procedure: CARDIOVERSION;  Surgeon: Thayer Headings, MD;  Location: [facility] ORS;  Service: Cardiovascular;  Laterality:  N/A;  . TEE WITHOUT CARDIOVERSION N/A 10/18/2014   Procedure: TRANSESOPHAGEAL ECHOCARDIOGRAM (TEE);  Surgeon: Thayer Headings, MD;  Location: [facility] ORS;  Service: Cardiovascular;  Laterality: N/A;  . TONSILLECTOMY    . TUMOR REMOVAL     right arm    Allergies as of 06/14/2020      Reactions   Ciprofloxacin    REACTION: dizzy   Ciprofloxacin Nausea And Vomiting   Hydrochlorothiazide Nausea And Vomiting   Hydrochlorothiazide W-triamterene    REACTION: nausea   Sulfa Antibiotics Nausea And Vomiting   Sulfonamide Derivatives    REACTION: nausea \\T \ vomiting   Valsartan    REACTION: angioedema - probable   Valsartan Nausea And Vomiting      Medication List       Accurate as of June 14, 2020  1:39 PM. If you have any questions, ask your nurse or doctor.        amiodarone 200 MG tablet Commonly known as: PACERONE Take 100 mg by mouth daily.   amLODipine 2.5 MG tablet Commonly known as: NORVASC Take 1 tablet (2.5 mg total) by mouth daily.   apixaban 5 MG Tabs tablet Commonly known as: Eliquis Take 1 tablet (5 mg total) by mouth 2 (two) times daily.   atorvastatin 40 MG tablet Commonly known as: LIPITOR Take 1 tablet (40 mg total) by mouth daily at 6 PM.   Cholecalciferol 25 MCG (1000 UT) capsule Take 2,000 Units by mouth daily.  CINNAMON PO Take 2 tablets by mouth daily.   CO-Q 10 Omega-3 Fish Oil Caps Take 1 capsule by mouth 2 (two) times daily.   CRANBERRY PO Take 1 tablet by mouth 2 (two) times daily.   ezetimibe 10 MG tablet Commonly known as: ZETIA Take 1 tablet (10 mg total) by mouth daily.   lidocaine 5 % Commonly known as: LIDODERM Place 1 patch onto the skin daily. Remove & Discard patch within 12 hours or as directed by MD   LYSINE PO Take by mouth daily.   metoprolol tartrate 25 MG tablet Commonly known as: LOPRESSOR Take 0.5 tablets (12.5 mg total) by mouth 2 (two) times daily.   MISC NATURAL PRODUCTS PO Take 1 tablet by mouth daily.  Multivitamin-Dr. Talmage Coin   mupirocin cream 2 % Commonly known as: Bactroban Apply 1 application topically 2 (two) times daily.   NON FORMULARY Cell Rebuild Takes 1 tablet BID.   NON FORMULARY Canaflex 1 tablet daily.   NON FORMULARY Epivery Takes 1 tablet bid.   Premarin vaginal cream Generic drug: conjugated estrogens Apply 0.5mg  (pea-sized amount)  just inside the vaginal introitus with a finger-tip on  Monday, Wednesday and Friday nights.   TURMERIC PO Take by mouth 2 (two) times daily.          Objective:   Physical Exam LMP  (LMP Unknown)  This is a virtual telephone visit, we could not communicate via video.  No vital signs available.  The communication was difficult, the patient is hard of hearing.     Assessment     80 year old female, history of HTN, A. fib, CHF, high cholesterol, anticoagulated, previously had 2 COVID vaccines, presents with  Shingles: Symptoms started 06/03/2020, finished a prescription for Valtrex today, rash is drying up, pain decreasing.  Advised patient there is no need to take another round of Valtrex, improvement should continue. URI: She also mentioned that developed symptoms 4 to 5 days ago, no fever chills, no chest pain. Overall symptoms are better. I suggested possibly check for Covid but she is quite hesitant. We agreed on continue with cough medicine OTC and reachout to me or primary doctor if symptoms are not gradually better.    I discussed the assessment and treatment plan with the patient. The patient was provided an opportunity to ask questions and all were answered. The patient agreed with the plan and demonstrated an understanding of the instructions.   The patient was advised to call back or seek an in-person evaluation if the symptoms worsen or if the condition fails to improve as anticipated.  I provided 19 minutes of non-face-to-face time during this encounter.  Kathlene November, MD

## 2020-06-16 NOTE — Progress Notes (Signed)
Cardiology Office Note  Date:  06/19/2020   ID:  Karen Bowman, DOB 1941/02/07, MRN [id number]  PCP:  Jinny Sanders, MD   Chief Complaint  Patient presents with  . Follow-up    6 month F/U-no new cardiac concerns    HPI:  80 year old woman with history of  paroxysmal atrial fibrillation,  hospitalization July 5 with discharge 10/20/2014 for atrial fibrillation,  discharged on amiodarone, anticoagulation, digoxin, metoprolol.  hyperlipidemia stroke March 2017 Moderate to severe bilateral proximal PCA stenoses. Mild right cavernous and supraclinoid ICA stenosis. She presents today for follow-up of her atrial fibrillation   LOV September 2021 On last clinic visit lots of stress, taking care of her husband at home, He sleeps all day, incontinence, Son helps on weekends He did not have additional help, she felt herself getting weaker Husband now in nursing center for past few days  No regular exercise, missing meals, losing weight which she attributed to stress  Has shingles and URI x 1 week Has not tested for covid  Uses a cane  BP elevated today, may be elevated from shingles , URI  Labs reviewed Total chol 157 Normal BMP  EKG personally reviewed by myself on todays visit Sinus bradycardia rate 53 bpm, no ST or T wave changes   Lab Results  Component Value Date   CHOL 157 05/16/2020   HDL 58.70 05/16/2020   LDLCALC 82 05/16/2020   TRIG 82.0 05/16/2020    Other past medical hx Echo 05/2015: EF 55 to 60%  Neck CT  05/2015 IMPRESSION: 1. Moderate to severe bilateral proximal PCA stenoses. 2. Mild right cavernous and supraclinoid ICA stenosis.  Hospital records were reviewed from March 2017, right side deficits upper extremity, lower extremity, speech deficits TPA was not given CT scan of the head and neck documenting diffuse PAD Discharged with eliquis and aspirin 81 mg daily She has completed rehabilitation, has been home for approximately one  month, Reports improving deficits, able to floss her teeth, brush her hair,  graduated to a cane from a walker Denies any tachycardia or palpitations concerning for arrhythmia  Notes indicate TEE with cardioversion 10/18/2014 which was unsuccessful   PMH:   has a past medical history of Acute heart failure ([place]), Anxiety, Arrhythmia, Arthritis, Atrial fibrillation ([place]), Heart murmur, Hyperlipemia, Hypertension, and Stroke ([place]).  PSH:    Past Surgical History:  Procedure Laterality Date  . ABDOMINAL HYSTERECTOMY    . APPENDECTOMY    . BLADDER SUSPENSION    . ELECTROPHYSIOLOGIC STUDY N/A 10/18/2014   Procedure: CARDIOVERSION;  Surgeon: Thayer Headings, MD;  Location: [facility] ORS;  Service: Cardiovascular;  Laterality: N/A;  . TEE WITHOUT CARDIOVERSION N/A 10/18/2014   Procedure: TRANSESOPHAGEAL ECHOCARDIOGRAM (TEE);  Surgeon: Thayer Headings, MD;  Location: [facility] ORS;  Service: Cardiovascular;  Laterality: N/A;  . TONSILLECTOMY    . TUMOR REMOVAL     right arm    Current Outpatient Medications  Medication Sig Dispense Refill  . amiodarone (PACERONE) 200 MG tablet Take 100 mg by mouth daily.    Marland Kitchen amLODipine (NORVASC) 2.5 MG tablet Take 1 tablet (2.5 mg total) by mouth daily. 90 tablet 3  . apixaban (ELIQUIS) 5 MG TABS tablet Take 1 tablet (5 mg total) by mouth 2 (two) times daily. 180 tablet 3  . atorvastatin (LIPITOR) 40 MG tablet Take 1 tablet (40 mg total) by mouth daily at 6 PM. 90 tablet 3  . Cholecalciferol 1000 UNITS capsule Take 2,000 Units by mouth  daily.     Marland Kitchen CINNAMON PO Take 2 tablets by mouth daily.     . Coenzyme Q10-Fish Oil-Vit E (CO-Q 10 OMEGA-3 FISH OIL) CAPS Take 1 capsule by mouth 2 (two) times daily.    Marland Kitchen conjugated estrogens (PREMARIN) vaginal cream Apply 0.5mg  (pea-sized amount)  just inside the vaginal introitus with a finger-tip on  Monday, Wednesday and Friday nights. 30 g 12  . CRANBERRY PO Take 1 tablet by mouth 2 (two) times daily.     Marland Kitchen ezetimibe (ZETIA)  10 MG tablet Take 1 tablet (10 mg total) by mouth daily. 90 tablet 3  . lidocaine (LIDODERM) 5 % Place 1 patch onto the skin daily. Remove & Discard patch within 12 hours or as directed by MD 10 patch 0  . LYSINE PO Take by mouth daily.    . metoprolol tartrate (LOPRESSOR) 25 MG tablet Take 0.5 tablets (12.5 mg total) by mouth 2 (two) times daily. 90 tablet 3  . MISC NATURAL PRODUCTS PO Take 1 tablet by mouth daily. Multivitamin-Dr. Talmage Coin    . mupirocin cream (BACTROBAN) 2 % Apply 1 application topically 2 (two) times daily. 15 g 0  . NON FORMULARY Cell Rebuild Takes 1 tablet BID.    Marland Kitchen NON FORMULARY Canaflex 1 tablet daily.    . NON FORMULARY Epivery Takes 1 tablet bid.    . TURMERIC PO Take by mouth 2 (two) times daily.     No current facility-administered medications for this visit.    Allergies:   Ciprofloxacin, Ciprofloxacin, Hydrochlorothiazide, Hydrochlorothiazide w-triamterene, Sulfa antibiotics, Sulfonamide derivatives, Valsartan, and Valsartan   Social History:  The patient  reports that she has never smoked. She has never used smokeless tobacco. She reports that she does not drink alcohol and does not use drugs.   Family History:   family history includes Cancer in her father; Diabetes in her maternal aunt; Heart disease in her mother; Osteoporosis in her mother; Stroke in her mother.    Review of Systems: Review of Systems  Constitutional: Positive for malaise/fatigue.  HENT: Negative.   Respiratory: Negative.   Cardiovascular: Negative.   Gastrointestinal: Negative.   Musculoskeletal: Negative.   Neurological: Negative.   Psychiatric/Behavioral: Negative.   All other systems reviewed and are negative.   PHYSICAL EXAM: VS:  BP (!) 160/76 (BP Location: Left Arm, Patient Position: Sitting, Cuff Size: Normal)   Pulse (!) 53   Ht 5\' 5"  (1.651 m)   Wt 114 lb (51.7 kg)   LMP  (LMP Unknown)   SpO2 94%   BMI 18.97 kg/m  , BMI Body mass index is 18.97  kg/m. Constitutional:  oriented to person, place, and time. No distress.  HENT:  Head: Grossly normal Eyes:  no discharge. No scleral icterus.  Neck: No JVD, no carotid bruits  Cardiovascular: Regular rate and rhythm, no murmurs appreciated Pulmonary/Chest: Clear to auscultation bilaterally, no wheezes or rails Abdominal: Soft.  no distension.  no tenderness.  Musculoskeletal: Normal range of motion Neurological:  normal muscle tone. Coordination normal. No atrophy Skin: Skin warm and dry Psychiatric: normal affect, pleasant  Recent Labs: 05/16/2020: ALT 22; BUN 23; Creatinine, Ser 0.78; Potassium 3.9; Sodium 140    Lipid Panel Lab Results  Component Value Date   CHOL 157 05/16/2020   HDL 58.70 05/16/2020   LDLCALC 82 05/16/2020   TRIG 82.0 05/16/2020    Wt Readings from Last 3 Encounters:  06/19/20 114 lb (51.7 kg)  06/06/20 113 lb 12 oz (51.6 kg)  05/23/20 118 lb (53.5 kg)      ASSESSMENT AND PLAN:  Paroxysmal atrial fibrillation (HCC) - Plan: EKG 12-Lead amio 100 once a day,  Eliquis, metoprolol Stable  HTN Elevated today, suspect from stress, Has shingles, URI Mild leg swelling, wearing compression She will call with numbers, has not been checking at home  HYPERLIPIDEMIA Cholesterol is at goal on the current lipid regimen. No changes to the medications were made.  Cerebrovascular accident (CVA) due to thrombosis of left middle cerebral artery (HCC) Prior history of stroke, paroxysmal atrial fibrillation Known cerebrovascular disease On anticoagulation, Eliquis  Gait instability Sedentary, legs weak  Adjustment disorder Severe stress taking care of husband She is weak, making it difficult, Has sons that are helping Try to redirect the focus back on to her health, needing to take care of herself, get her leg stronger     Total encounter time more than 25 minutes  Greater than 50% was spent in counseling and coordination of care with the  patient   No orders of the defined types were placed in this encounter.    Signed, Esmond Plants, M.D., Ph.D. 06/19/2020  [place], [place]

## 2020-06-17 ENCOUNTER — Other Ambulatory Visit: Payer: Self-pay | Admitting: Cardiovascular Disease

## 2020-06-19 ENCOUNTER — Ambulatory Visit: Payer: Medicare Other | Admitting: Cardiovascular Disease

## 2020-06-19 ENCOUNTER — Encounter: Payer: Self-pay | Admitting: Cardiovascular Disease

## 2020-06-19 ENCOUNTER — Other Ambulatory Visit: Payer: Self-pay

## 2020-06-19 VITALS — BP 160/76 | HR 53 | Ht 65.0 in | Wt 114.0 lb

## 2020-06-19 DIAGNOSIS — I63312 Cerebral infarction due to thrombosis of left middle cerebral artery: Secondary | ICD-10-CM

## 2020-06-19 DIAGNOSIS — I480 Paroxysmal atrial fibrillation: Secondary | ICD-10-CM

## 2020-06-19 DIAGNOSIS — I5022 Chronic systolic (congestive) heart failure: Secondary | ICD-10-CM | POA: Diagnosis not present

## 2020-06-19 DIAGNOSIS — I10 Essential (primary) hypertension: Secondary | ICD-10-CM | POA: Diagnosis not present

## 2020-06-19 DIAGNOSIS — E782 Mixed hyperlipidemia: Secondary | ICD-10-CM

## 2020-06-19 NOTE — Patient Instructions (Signed)
Medication Instructions:  No changes  If you need a refill on your cardiac medications before your next appointment, please call your pharmacy.    Lab work: No new labs needed   If you have labs (blood work) drawn today and your tests are completely normal, you will receive your results only by: . MyChart Message (if you have MyChart) OR . A paper copy in the mail If you have any lab test that is abnormal or we need to change your treatment, we will call you to review the results.   Testing/Procedures: No new testing needed   Follow-Up: At [facility], you and your health needs are our priority.  As part of our continuing mission to provide you with exceptional heart care, we have created designated Provider Care Teams.  These Care Teams include your primary Cardiologist (physician) and Advanced Practice Providers (APPs -  Physician Assistants and Nurse Practitioners) who all work together to provide you with the care you need, when you need it.  . You will need a follow up appointment in 6 months  . Providers on your designated Care Team:   . Christopher Berge, NP . Ryan Dunn, PA-C . Jacquelyn Visser, PA-C  Any Other Special Instructions Will Be Listed Below (If Applicable).  COVID-19 Vaccine Information can be found at: [email or web address] For questions related to vaccine distribution or appointments, please email [email or web address] or call [phone].     

## 2020-07-26 NOTE — Progress Notes (Signed)
04/23/2018 3:33 PM   Karen Bowman Jun 19, 1940 [id number]  Referring provider: Jinny Sanders, MD [address] [place],  [place] [address]  Chief Complaint  Patient presents with  . Over Active Bladder   Urological history: 1. Vaginal atrophy -managed with vaginal estrogen cream three nights weekly  2. Cystocele -s/p cystocele repair by GYN in early 1990s for symptomatic prolapse following benign hyst many years prior  HPI: Karen Bowman is a 80 y.o. female who presents today for a yearly follow up.   She states that she has noted some increase in urinary frequency depending upon the amount and type of liquid she drinks.  She also notes some mild urge incontinence for the same reasons.    Patient denies any modifying or aggravating factors.  Patient denies any gross hematuria, dysuria or suprapubic/flank pain.  Patient denies any fevers, chills, nausea or vomiting.   She is applying the vaginal cream 3 nights weekly.    PMH: Past Medical History:  Diagnosis Date  . Acute heart failure ([place])   . Anxiety   . Arrhythmia   . Arthritis   . Atrial fibrillation ([place])   . Heart murmur   . Hyperlipemia   . Hypertension   . Stroke [facility])     Surgical History: Past Surgical History:  Procedure Laterality Date  . ABDOMINAL HYSTERECTOMY    . APPENDECTOMY    . BLADDER SUSPENSION    . ELECTROPHYSIOLOGIC STUDY N/A 10/18/2014   Procedure: CARDIOVERSION;  Surgeon: Thayer Headings, MD;  Location: [facility] ORS;  Service: Cardiovascular;  Laterality: N/A;  . TEE WITHOUT CARDIOVERSION N/A 10/18/2014   Procedure: TRANSESOPHAGEAL ECHOCARDIOGRAM (TEE);  Surgeon: Thayer Headings, MD;  Location: [facility] ORS;  Service: Cardiovascular;  Laterality: N/A;  . TONSILLECTOMY    . TUMOR REMOVAL     right arm    Home Medications:  Allergies as of 07/27/2020      Reactions   Ciprofloxacin    REACTION: dizzy   Ciprofloxacin Nausea And Vomiting   Hydrochlorothiazide Nausea And Vomiting    Hydrochlorothiazide W-triamterene    REACTION: nausea   Sulfa Antibiotics Nausea And Vomiting   Sulfonamide Derivatives    REACTION: nausea \\T \ vomiting   Valsartan    REACTION: angioedema - probable   Valsartan Nausea And Vomiting      Medication List       Accurate as of July 27, 2020 11:59 PM. If you have any questions, ask your nurse or doctor.        amiodarone 200 MG tablet Commonly known as: PACERONE Take 100 mg by mouth daily.   amLODipine 2.5 MG tablet Commonly known as: NORVASC Take 1 tablet (2.5 mg total) by mouth daily.   atorvastatin 40 MG tablet Commonly known as: LIPITOR Take 1 tablet (40 mg total) by mouth daily at 6 PM.   Cholecalciferol 25 MCG (1000 UT) capsule Take 2,000 Units by mouth daily.   CINNAMON PO Take 2 tablets by mouth daily.   CO-Q 10 Omega-3 Fish Oil Caps Take 1 capsule by mouth 2 (two) times daily.   CRANBERRY PO Take 1 tablet by mouth 2 (two) times daily.   Eliquis 5 MG Tabs tablet Generic drug: apixaban TAKE 1 TABLET BY MOUTH TWICE A DAY   ezetimibe 10 MG tablet Commonly known as: ZETIA Take 1 tablet (10 mg total) by mouth daily.   lidocaine 5 % Commonly known as: LIDODERM Place 1 patch onto the skin daily. Remove &  Discard patch within 12 hours or as directed by MD   LYSINE PO Take by mouth daily.   metoprolol tartrate 25 MG tablet Commonly known as: LOPRESSOR TAKE 0.5 TABLETS (12.5 MG TOTAL) BY MOUTH 2 (TWO) TIMES DAILY.   MISC NATURAL PRODUCTS PO Take 1 tablet by mouth daily. Multivitamin-Dr. Talmage Coin   mupirocin cream 2 % Commonly known as: Bactroban Apply 1 application topically 2 (two) times daily.   NON FORMULARY Cell Rebuild Takes 1 tablet BID.   NON FORMULARY Canaflex 1 tablet daily.   NON FORMULARY Epivery Takes 1 tablet bid.   Premarin vaginal cream Generic drug: conjugated estrogens Apply 0.5mg  (pea-sized amount)  just inside the vaginal introitus with a finger-tip on  Monday, Wednesday and  Friday nights.   TURMERIC PO Take by mouth 2 (two) times daily.       Allergies:  Allergies  Allergen Reactions  . Ciprofloxacin     REACTION: dizzy  . Ciprofloxacin Nausea And Vomiting  . Hydrochlorothiazide Nausea And Vomiting  . Hydrochlorothiazide W-Triamterene     REACTION: nausea  . Sulfa Antibiotics Nausea And Vomiting  . Sulfonamide Derivatives     REACTION: nausea \\T \ vomiting  . Valsartan     REACTION: angioedema - probable  . Valsartan Nausea And Vomiting    Family History: Family History  Problem Relation Age of Onset  . Osteoporosis Mother   . Heart disease Mother   . Stroke Mother   . Cancer Father   . Diabetes Maternal Aunt     Social History:  reports that she has never smoked. She has never used smokeless tobacco. She reports that she does not drink alcohol and does not use drugs.  ROS: For pertinent review of systems please refer to history of present illness  Physical Exam: BP (!) 152/54   Pulse (!) 56   Ht 5\' 5"  (1.651 m)   Wt 113 lb 12.8 oz (51.6 kg)   LMP  (LMP Unknown)   BMI 18.94 kg/m   Constitutional:  Well nourished. Alert and oriented, No acute distress. HEENT: [place] AT, mask in place.  Trachea midline Cardiovascular: No clubbing, cyanosis, or edema. Respiratory: Normal respiratory effort, no increased work of breathing. GU: No CVA tenderness.  No bladder fullness or masses.  Deferred exam due to having hosiery on  Neurologic: Grossly intact, no focal deficits, moving all 4 extremities. Psychiatric: Normal mood and affect.   Laboratory Data: Results for orders placed or performed in visit on 05/16/20  Hepatitis C antibody  Result Value Ref Range   Hepatitis C Ab NON-REACTIVE NON-REACTI   SIGNAL TO CUT-OFF 0.01 <1.00  Comprehensive metabolic panel  Result Value Ref Range   Sodium 140 135 - 145 mEq/L   Potassium 3.9 3.5 - 5.1 mEq/L   Chloride 104 96 - 112 mEq/L   CO2 30 19 - 32 mEq/L   Glucose, Bld 79 70 - 99 mg/dL   BUN 23 6  - 23 mg/dL   Creatinine, Ser 0.78 0.40 - 1.20 mg/dL   Total Bilirubin 0.6 0.2 - 1.2 mg/dL   Alkaline Phosphatase 66 39 - 117 U/L   AST 23 0 - 37 U/L   ALT 22 0 - 35 U/L   Total Protein 7.2 6.0 - 8.3 g/dL   Albumin 4.2 3.5 - 5.2 g/dL   GFR 73.17 >60.00 mL/min   Calcium 9.2 8.4 - 10.5 mg/dL  Lipid panel  Result Value Ref Range   Cholesterol 157 0 - 200 mg/dL  Triglycerides 82.0 0.0 - 149.0 mg/dL   HDL 58.70 >39.00 mg/dL   VLDL 16.4 0.0 - 40.0 mg/dL   LDL Cholesterol 82 0 - 99 mg/dL   Total CHOL/HDL Ratio 3    NonHDL 98.10   I have reviewed the labs.   Assessment & Plan:    1. Atrophic vaginitis -Continue applying the vaginal estrogen cream 3 nights weekly  2. Cystocele -No bothersome incontinence symptoms at this time  Return in about 1 year (around 07/27/2021) for recheck .  These notes generated with voice recognition software. I apologize for typographical errors.  Zara Council, PA-C  [facility] [address] [place], [place] [address] [phone]

## 2020-07-27 ENCOUNTER — Other Ambulatory Visit: Payer: Self-pay

## 2020-07-27 ENCOUNTER — Encounter: Payer: Self-pay | Admitting: Urology

## 2020-07-27 ENCOUNTER — Ambulatory Visit: Payer: Medicare Other | Admitting: Urology

## 2020-07-27 ENCOUNTER — Other Ambulatory Visit: Payer: Self-pay | Admitting: Cardiovascular Disease

## 2020-07-27 VITALS — BP 152/54 | HR 56 | Ht 65.0 in | Wt 113.8 lb

## 2020-07-27 DIAGNOSIS — N952 Postmenopausal atrophic vaginitis: Secondary | ICD-10-CM | POA: Diagnosis not present

## 2020-07-27 NOTE — Telephone Encounter (Signed)
Refill Request.  

## 2020-07-27 NOTE — Telephone Encounter (Signed)
Pt last saw Dr Rockey Situ 06/19/20, last labs 05/16/20 Creat 0.78, age 80, weight 51.7kg, based on specified criteria pt is on appropriate dosage of Eliquis 5mg  BID for afib.  Will refill rx.

## 2020-08-07 ENCOUNTER — Telehealth: Payer: Self-pay

## 2020-08-07 NOTE — Chronic Care Management (AMB) (Addendum)
Chronic Care Management Pharmacy Assistant   Name: Karen Bowman  MRN: [id number] DOB: [birth year]  Reason for Encounter: Disease State  Recent office visits:  06/14/20- Dr. Kathlene November- Family Practice- Refilled Valtrex for shingles 06/06/20- Dr. Waunita Bowman- Family Practice- Started Valtrex 1000 mg TID and lidocaine patch for shingles. 05/23/20- Dr. Eliezer Bowman- PCP- [facility]. Referred to dermatology. Follow up in 1 year  Recent consult visits:  07/27/20- Karen Bowman- Urology- No changes. Follow up in 1 year.  06/19/20- Dr. Ida Bowman- Cardiology- No changes. Follow up in 6 months.  Hospital visits:  None in previous 6 months  Medications: Outpatient Encounter Medications as of 08/07/2020  Medication Sig   amiodarone (PACERONE) 200 MG tablet Take 100 mg by mouth daily.   amLODipine (NORVASC) 2.5 MG tablet Take 1 tablet (2.5 mg total) by mouth daily.   atorvastatin (LIPITOR) 40 MG tablet Take 1 tablet (40 mg total) by mouth daily at 6 PM.   Cholecalciferol 1000 UNITS capsule Take 2,000 Units by mouth daily.    CINNAMON PO Take 2 tablets by mouth daily.    Coenzyme Q10-Fish Oil-Vit E (CO-Q 10 OMEGA-3 FISH OIL) CAPS Take 1 capsule by mouth 2 (two) times daily.   conjugated estrogens (PREMARIN) vaginal cream Apply 0.5mg  (pea-sized amount)  just inside the vaginal introitus with a finger-tip on  Monday, Wednesday and Friday nights.   CRANBERRY PO Take 1 tablet by mouth 2 (two) times daily.    ELIQUIS 5 MG TABS tablet TAKE 1 TABLET BY MOUTH TWICE A DAY   ezetimibe (ZETIA) 10 MG tablet Take 1 tablet (10 mg total) by mouth daily.   lidocaine (LIDODERM) 5 % Place 1 patch onto the skin daily. Remove & Discard patch within 12 hours or as directed by MD   LYSINE PO Take by mouth daily.   metoprolol tartrate (LOPRESSOR) 25 MG tablet TAKE 0.5 TABLETS (12.5 MG TOTAL) BY MOUTH 2 (TWO) TIMES DAILY.   MISC NATURAL PRODUCTS PO Take 1 tablet by mouth daily. Multivitamin-Dr. Talmage Coin   mupirocin cream  (BACTROBAN) 2 % Apply 1 application topically 2 (two) times daily.   NON FORMULARY Cell Rebuild Takes 1 tablet BID.   NON FORMULARY Canaflex 1 tablet daily.   NON FORMULARY Epivery Takes 1 tablet bid.   TURMERIC PO Take by mouth 2 (two) times daily.   No facility-administered encounter medications on file as of 08/07/2020.    Contacted Karen Bowman for general disease state call.   Since last visit with CPP, no interventions have been made.   The patient has had an ED visit since last contact.   The patient's current Chronic and PDC medications are: Atorvastatin 40 mg  05/27/20 90 DS  The patient denies problems with their health.   The patient denies  problems with her pharmacy.   The patient denies side effects with her medications.   She denies concerns or questions for Karen Bowman, PharmD at this time.   States she takes supplements and takes care of herself best she can. Her husband was recently enrolled in hospice. She states they are trying to cope best they can.   Follow-Up:  Pharmacist Review  Karen Bowman, CPP notified  Karen Bowman, [place] Assistant [phone]  I have reviewed the care management and care coordination activities outlined in this encounter and I am certifying that I agree with the content of this note. No further action required.  Karen Bowman, PharmD Clinical Pharmacist [facility] at [place]

## 2020-08-29 ENCOUNTER — Other Ambulatory Visit: Payer: Self-pay | Admitting: Cardiovascular Disease

## 2020-08-30 ENCOUNTER — Other Ambulatory Visit: Payer: Self-pay | Admitting: Cardiovascular Disease

## 2020-08-30 NOTE — Telephone Encounter (Signed)
Rx request sent to pharmacy.  

## 2020-10-02 ENCOUNTER — Other Ambulatory Visit: Payer: Self-pay | Admitting: Cardiovascular Disease

## 2020-11-01 ENCOUNTER — Telehealth: Payer: Self-pay

## 2020-11-01 ENCOUNTER — Telehealth: Payer: Medicare Other

## 2020-11-01 NOTE — Chronic Care Management (AMB) (Addendum)
    Chronic Care Management Pharmacy Assistant   Name: RAYMONDE HENDY  MRN: [id number] DOB: [birth year]  Reason for Encounter: CCM Reminder Call   Conditions to be addressed/monitored: CHF, HTN, and HLD  Medications: Outpatient Encounter Medications as of 11/01/2020  Medication Sig   amiodarone (PACERONE) 200 MG tablet TAKE 1/2 TABLET BY MOUTH DAILY   amLODipine (NORVASC) 2.5 MG tablet TAKE 1 TABLET BY MOUTH EVERY DAY   atorvastatin (LIPITOR) 40 MG tablet TAKE 1 TABLET BY MOUTH DAILY AT 6 PM.   Cholecalciferol 1000 UNITS capsule Take 2,000 Units by mouth daily.    CINNAMON PO Take 2 tablets by mouth daily.    Coenzyme Q10-Fish Oil-Vit E (CO-Q 10 OMEGA-3 FISH OIL) CAPS Take 1 capsule by mouth 2 (two) times daily.   conjugated estrogens (PREMARIN) vaginal cream Apply 0.'5mg'$  (pea-sized amount)  just inside the vaginal introitus with a finger-tip on  Monday, Wednesday and Friday nights.   CRANBERRY PO Take 1 tablet by mouth 2 (two) times daily.    ELIQUIS 5 MG TABS tablet TAKE 1 TABLET BY MOUTH TWICE A DAY   ezetimibe (ZETIA) 10 MG tablet TAKE 1 TABLET BY MOUTH EVERY DAY   lidocaine (LIDODERM) 5 % Place 1 patch onto the skin daily. Remove & Discard patch within 12 hours or as directed by MD   LYSINE PO Take by mouth daily.   metoprolol tartrate (LOPRESSOR) 25 MG tablet TAKE 0.5 TABLETS (12.5 MG TOTAL) BY MOUTH 2 (TWO) TIMES DAILY.   MISC NATURAL PRODUCTS PO Take 1 tablet by mouth daily. Multivitamin-Dr. Talmage Coin   mupirocin cream (BACTROBAN) 2 % Apply 1 application topically 2 (two) times daily.   NON FORMULARY Cell Rebuild Takes 1 tablet BID.   NON FORMULARY Canaflex 1 tablet daily.   NON FORMULARY Epivery Takes 1 tablet bid.   TURMERIC PO Take by mouth 2 (two) times daily.   No facility-administered encounter medications on file as of 11/01/2020.    [name] was contacted to remind her of her upcoming telephone visit with [name] on 11/06/2020 at 1:00pm. Patient was  reminded to have all medications, supplements and any blood glucose and blood pressure readings available for review at appointment.   Are you having any problems with your medications? No  Do you have any concerns you like to discuss with the pharmacist? No    Star Rating Drugs: Medication:  Last Fill: Day Supply Atorvastatin '40mg'$  10/13/20 [place], CPP notified  Avel Sensor, [place] Assistant [phone]  I have reviewed the care management and care coordination activities outlined in this encounter and I am certifying that I agree with the content of this note. No further action required.  [name], PharmD Clinical Pharmacist [facility] at [facility] [phone]

## 2020-11-06 ENCOUNTER — Telehealth: Payer: Medicare Other

## 2020-11-06 NOTE — Telephone Encounter (Signed)
Contacted patient for CCM visit. Her husband passed this Saturday. Expressed my condolences. She is doing okay and will let us know if she needs anything. Will reschedule CCM for 3 months out.  [name], PharmD Clinical Pharmacist [facility] at [facility] [phone]

## 2020-12-05 ENCOUNTER — Other Ambulatory Visit: Payer: Self-pay | Admitting: Cardiovascular Disease

## 2020-12-20 ENCOUNTER — Ambulatory Visit: Payer: Medicare Other | Admitting: Cardiovascular Disease

## 2020-12-20 ENCOUNTER — Encounter: Payer: Self-pay | Admitting: Cardiovascular Disease

## 2020-12-20 ENCOUNTER — Other Ambulatory Visit: Payer: Self-pay

## 2020-12-20 VITALS — BP 158/86 | HR 59 | Ht 65.0 in | Wt 117.0 lb

## 2020-12-20 DIAGNOSIS — I480 Paroxysmal atrial fibrillation: Secondary | ICD-10-CM

## 2020-12-20 DIAGNOSIS — I10 Essential (primary) hypertension: Secondary | ICD-10-CM | POA: Diagnosis not present

## 2020-12-20 DIAGNOSIS — I63312 Cerebral infarction due to thrombosis of left middle cerebral artery: Secondary | ICD-10-CM | POA: Diagnosis not present

## 2020-12-20 DIAGNOSIS — E782 Mixed hyperlipidemia: Secondary | ICD-10-CM

## 2020-12-20 DIAGNOSIS — I5022 Chronic systolic (congestive) heart failure: Secondary | ICD-10-CM

## 2020-12-20 NOTE — Progress Notes (Signed)
Cardiology Office Note  Date:  12/20/2020   ID:  Karen Bowman, DOB May 02, 1940, MRN [id number]  PCP:  Jinny Sanders, MD   Chief Complaint  Patient presents with   6 month     "Doing well." Medications reviewed by the patient verbally.     HPI:  80 year old woman with history of  paroxysmal atrial fibrillation,  hospitalization July 5 with discharge 10/20/2014 for atrial fibrillation,  discharged on amiodarone, anticoagulation, digoxin, metoprolol.  hyperlipidemia stroke March 2017, still right leg weakness Moderate to severe bilateral proximal PCA stenoses. Mild right cavernous and supraclinoid ICA stenosis. She presents today for follow-up of her atrial fibrillation   LOV 05/2020 Lost husband 10/2020 Was on hospice Married 50 years Injured left hand  Up much at night, awake Sleeps much of day  Difficult time adjusting No regular exercise,   Uses a cane sedentary  BP elevated today, may be elevated from shingles , URI  Labs reviewed Total chol 157 Normal BMP  EKG personally reviewed by myself on todays visit Sinus bradycardia rate 59 bpm, no ST or T wave changes   Lab Results  Component Value Date   CHOL 157 05/16/2020   HDL 58.70 05/16/2020   LDLCALC 82 05/16/2020   TRIG 82.0 05/16/2020    Other past medical hx Echo 05/2015: EF 55 to 60%  Neck CT  05/2015 IMPRESSION: 1. Moderate to severe bilateral proximal PCA stenoses. 2. Mild right cavernous and supraclinoid ICA stenosis.  Hospital records were reviewed from March 2017, right side deficits upper extremity, lower extremity, speech deficits TPA was not given CT scan of the head and neck documenting diffuse PAD Discharged with eliquis and aspirin 81 mg daily She has completed rehabilitation, has been home for approximately one month, Reports improving deficits, able to floss her teeth, brush her hair,  graduated to a cane from a walker Denies any tachycardia or palpitations concerning for  arrhythmia   Notes indicate TEE with cardioversion 10/18/2014 which was unsuccessful    PMH:   has a past medical history of Acute heart failure ([place]), Anxiety, Arrhythmia, Arthritis, Atrial fibrillation ([place]), Heart murmur, Hyperlipemia, Hypertension, and Stroke ([place]).  PSH:    Past Surgical History:  Procedure Laterality Date   ABDOMINAL HYSTERECTOMY     APPENDECTOMY     BLADDER SUSPENSION     ELECTROPHYSIOLOGIC STUDY N/A 10/18/2014   Procedure: CARDIOVERSION;  Surgeon: [facility] Headings, MD;  Location: [facility] ORS;  Service: Cardiovascular;  Laterality: N/A;   TEE WITHOUT CARDIOVERSION N/A 10/18/2014   Procedure: TRANSESOPHAGEAL ECHOCARDIOGRAM (TEE);  Surgeon: [facility] Headings, MD;  Location: [facility] ORS;  Service: Cardiovascular;  Laterality: N/A;   TONSILLECTOMY     TUMOR REMOVAL     right arm    Current Outpatient Medications  Medication Sig Dispense Refill   amiodarone (PACERONE) 200 MG tablet TAKE 1/2 TABLET BY MOUTH DAILY 45 tablet 0   amLODipine (NORVASC) 2.5 MG tablet TAKE 1 TABLET BY MOUTH EVERY DAY 90 tablet 3   atorvastatin (LIPITOR) 40 MG tablet TAKE 1 TABLET BY MOUTH DAILY AT 6 PM. 90 tablet 0   Cholecalciferol 1000 UNITS capsule Take 2,000 Units by mouth daily.      CINNAMON PO Take 2 tablets by mouth daily.      Coenzyme Q10-Fish Oil-Vit E (CO-Q 10 OMEGA-3 FISH OIL) CAPS Take 1 capsule by mouth 2 (two) times daily.     conjugated estrogens (PREMARIN) vaginal cream Apply 0.5mg  (pea-sized amount)  just inside the  vaginal introitus with a finger-tip on  Monday, Wednesday and Friday nights. 30 g 12   CRANBERRY PO Take 1 tablet by mouth 2 (two) times daily.      ELIQUIS 5 MG TABS tablet TAKE 1 TABLET BY MOUTH TWICE A DAY 180 tablet 2   ezetimibe (ZETIA) 10 MG tablet TAKE 1 TABLET BY MOUTH EVERY DAY 90 tablet 3   lidocaine (LIDODERM) 5 % Place 1 patch onto the skin daily. Remove & Discard patch within 12 hours or as directed by MD 10 patch 0   LYSINE PO Take by mouth daily.      metoprolol tartrate (LOPRESSOR) 25 MG tablet TAKE 0.5 TABLETS (12.5 MG TOTAL) BY MOUTH 2 (TWO) TIMES DAILY. 90 tablet 2   MISC NATURAL PRODUCTS PO Take 1 tablet by mouth daily. Multivitamin-Dr. Talmage Coin     mupirocin cream (BACTROBAN) 2 % Apply 1 application topically 2 (two) times daily. 15 g 0   NON FORMULARY Cell Rebuild Takes 1 tablet BID.     NON FORMULARY Canaflex 1 tablet daily.     NON FORMULARY Epivery Takes 1 tablet bid.     NON FORMULARY Limbex take one tablet daily     NON FORMULARY Sanguenol nitric oxide one tablet daily     NON FORMULARY Reprieve one tablet daily     TURMERIC PO Take by mouth 2 (two) times daily.     No current facility-administered medications for this visit.    Allergies:   Ciprofloxacin, Ciprofloxacin, Hydrochlorothiazide, Hydrochlorothiazide w-triamterene, Sulfa antibiotics, Sulfonamide derivatives, Valsartan, and Valsartan   Social History:  The patient  reports that she has never smoked. She has never used smokeless tobacco. She reports that she does not drink alcohol and does not use drugs.   Family History:   family history includes Cancer in her father; Diabetes in her maternal aunt; Heart disease in her mother; Osteoporosis in her mother; Stroke in her mother.    Review of Systems: Review of Systems  Constitutional:  Positive for malaise/fatigue.  HENT: Negative.    Respiratory: Negative.    Cardiovascular: Negative.   Gastrointestinal: Negative.   Musculoskeletal: Negative.   Neurological: Negative.   Psychiatric/Behavioral: Negative.    All other systems reviewed and are negative.  PHYSICAL EXAM: VS:  BP (!) 158/86 (BP Location: Left Arm, Patient Position: Sitting, Cuff Size: Normal)   Pulse (!) 59   Ht 5\' 5"  (1.651 m)   Wt 117 lb (53.1 kg)   LMP  (LMP Unknown)   SpO2 97%   BMI 19.47 kg/m  , BMI Body mass index is 19.47 kg/m. Constitutional:  oriented to person, place, and time. No distress.  HENT:  Head: Grossly normal Eyes:  no  discharge. No scleral icterus.  Neck: No JVD, no carotid bruits  Cardiovascular: Regular rate and rhythm, no murmurs appreciated Pulmonary/Chest: Clear to auscultation bilaterally, no wheezes or rails Abdominal: Soft.  no distension.  no tenderness.  Musculoskeletal: Normal range of motion Neurological:  normal muscle tone. Coordination normal. No atrophy Skin: Skin warm and dry Psychiatric: normal affect, pleasant   Recent Labs: 05/16/2020: ALT 22; BUN 23; Creatinine, Ser 0.78; Potassium 3.9; Sodium 140    Lipid Panel Lab Results  Component Value Date   CHOL 157 05/16/2020   HDL 58.70 05/16/2020   LDLCALC 82 05/16/2020   TRIG 82.0 05/16/2020    Wt Readings from Last 3 Encounters:  12/20/20 117 lb (53.1 kg)  07/27/20 113 lb 12.8 oz (51.6 kg)  06/19/20  114 lb (51.7 kg)      ASSESSMENT AND PLAN:  Paroxysmal atrial fibrillation (HCC) - Plan: EKG 12-Lead amio 100 once a day,  Eliquis, metoprolol Stable  HTN Elevated today, suspect from stress, She will check at home  HYPERLIPIDEMIA Cholesterol is at goal on the current lipid regimen. No changes to the medications were made.  Cerebrovascular accident (CVA) due to thrombosis of left middle cerebral artery (HCC) Prior history of stroke, paroxysmal atrial fibrillation Known cerebrovascular disease On anticoagulation, Eliquis  Gait instability Sedentary, legs weak Long discussion with patient and her son that she needs to walk Slow getting going given recent loss of husband High fall risk, discussed use of a walker  Adjustment disorder Loss of husband, adjusting   Total encounter time more than 25 minutes  Greater than 50% was spent in counseling and coordination of care with the patient   Orders Placed This Encounter  Procedures   EKG 12-Lead     Signed, Esmond Plants, M.D., Ph.D. 12/20/2020  [place], [place]

## 2020-12-20 NOTE — Patient Instructions (Addendum)
Medication Instructions:  No changes  If you need a refill on your cardiac medications before your next appointment, please call your pharmacy.   Lab work: No new labs needed  Testing/Procedures: No new testing needed  Follow-Up: At [facility], you and your health needs are our priority.  As part of our continuing mission to provide you with exceptional heart care, we have created designated Provider Care Teams.  These Care Teams include your primary Cardiologist (physician) and Advanced Practice Providers (APPs -  Physician Assistants and Nurse Practitioners) who all work together to provide you with the care you need, when you need it.  You will need a follow up appointment in 12 months  Providers on your designated Care Team:   Christopher Berge, NP Ryan Dunn, PA-C Jacquelyn Visser, PA-C Cadence Furth, PA-C  COVID-19 Vaccine Information can be found at: [email or web address] For questions related to vaccine distribution or appointments, please email [email or web address] or call [phone].    

## 2021-02-12 ENCOUNTER — Telehealth: Payer: Self-pay

## 2021-02-12 NOTE — Chronic Care Management (AMB) (Signed)
    Chronic Care Management Pharmacy Assistant   Name: Karen Bowman  MRN: [id number] DOB: June 02, 1940   Reason for Encounter: Reminder Call   Conditions to be addressed:  HTN,HLD    Medications: Outpatient Encounter Medications as of 02/12/2021  Medication Sig   amiodarone (PACERONE) 200 MG tablet TAKE 1/2 TABLET BY MOUTH DAILY   amLODipine (NORVASC) 2.5 MG tablet TAKE 1 TABLET BY MOUTH EVERY DAY   atorvastatin (LIPITOR) 40 MG tablet TAKE 1 TABLET BY MOUTH DAILY AT 6 PM.   Cholecalciferol 1000 UNITS capsule Take 2,000 Units by mouth daily.    CINNAMON PO Take 2 tablets by mouth daily.    Coenzyme Q10-Fish Oil-Vit E (CO-Q 10 OMEGA-3 FISH OIL) CAPS Take 1 capsule by mouth 2 (two) times daily.   conjugated estrogens (PREMARIN) vaginal cream Apply 0.5mg  (pea-sized amount)  just inside the vaginal introitus with a finger-tip on  Monday, Wednesday and Friday nights.   CRANBERRY PO Take 1 tablet by mouth 2 (two) times daily.    ELIQUIS 5 MG TABS tablet TAKE 1 TABLET BY MOUTH TWICE A DAY   ezetimibe (ZETIA) 10 MG tablet TAKE 1 TABLET BY MOUTH EVERY DAY   lidocaine (LIDODERM) 5 % Place 1 patch onto the skin daily. Remove & Discard patch within 12 hours or as directed by MD   LYSINE PO Take by mouth daily.   metoprolol tartrate (LOPRESSOR) 25 MG tablet TAKE 0.5 TABLETS (12.5 MG TOTAL) BY MOUTH 2 (TWO) TIMES DAILY.   MISC NATURAL PRODUCTS PO Take 1 tablet by mouth daily. Multivitamin-Dr. Talmage Coin   mupirocin cream (BACTROBAN) 2 % Apply 1 application topically 2 (two) times daily.   NON FORMULARY Cell Rebuild Takes 1 tablet BID.   NON FORMULARY Canaflex 1 tablet daily.   NON FORMULARY Epivery Takes 1 tablet bid.   NON FORMULARY Limbex take one tablet daily   NON FORMULARY Sanguenol nitric oxide one tablet daily   NON FORMULARY Reprieve one tablet daily   TURMERIC PO Take by mouth 2 (two) times daily.   No facility-administered encounter medications on file as of 02/12/2021.   [name] was contacted to remind her of her upcoming telephone visit with [name] on 02/19/21 at 8:30am. Patient was reminded to have all medications, supplements and any blood glucose and blood pressure readings available for review at appointment.   Are you having any problems with your medications? No  Do you have any concerns you like to discuss with the pharmacist? No    Star Rating Drugs: Medication:  Last Fill: Day Supply Atorvastatin 40mg  10/13/20 [place], CPP notified  Avel Sensor, [place] Assistant [phone]  Total time spent for month CPA: 10 min

## 2021-02-19 ENCOUNTER — Ambulatory Visit (INDEPENDENT_AMBULATORY_CARE_PROVIDER_SITE_OTHER): Payer: Medicare Other

## 2021-02-19 ENCOUNTER — Other Ambulatory Visit: Payer: Self-pay

## 2021-02-19 ENCOUNTER — Telehealth: Payer: Self-pay

## 2021-02-19 DIAGNOSIS — E782 Mixed hyperlipidemia: Secondary | ICD-10-CM

## 2021-02-19 DIAGNOSIS — I10 Essential (primary) hypertension: Secondary | ICD-10-CM

## 2021-02-19 NOTE — Telephone Encounter (Addendum)
Patient is interested in physical therapy to strengthen her legs. States this was recommended by cardiologist. Symptoms started after stroke in 2017 but lately has become more weak since husband passed. She has a hard time walking around her home and high fall risk. She would need someone to come to the home for PT services.  Patient also interested in repeating DEXA scan and vitamin D level (last level low). Next visit with PCP is [facility] in February 2022. We could wait until then for vitamin D level. Please advise for next steps on scheduling DEXA if you agree.  [name], PharmD Clinical Pharmacist [facility] [phone]

## 2021-02-19 NOTE — Progress Notes (Signed)
Chronic Care Management Pharmacy Note  02/19/2021 Name:  Karen Bowman MRN:  [id number] DOB:  1940-07-20  Subjective: Karen Bowman is an 80 y.o. year old female who is a primary patient of Bedsole, Amy E, MD.  The CCM team was consulted for assistance with disease management and care coordination needs.    Engaged with patient by telephone for follow up visit in response to provider referral for pharmacy case management and/or care coordination services.   Consent to Services:  The patient was given information about Chronic Care Management services, agreed to services, and gave verbal consent prior to initiation of services.  Please see initial visit note for detailed documentation.   Patient Care Team: Jinny Sanders, MD as PCP - General (Family Medicine) Jinny Sanders, MD Alisa Graff, FNP as Nurse Practitioner (Cardiology) Minna Merritts, MD as Consulting Physician (Cardiology) [name], [facility] as Pharmacist (Pharmacist)  Recent office visits: 05/23/20- Dr. [name]- PCP- [facility]. Referred to dermatology. Follow up in 1 year  Recent consult visits: 12/20/20 - Cardiology - BP elevated today, may be elevated from stress - shingles, URI. High fall risk, need to walk. Adjustment disorder with recent loss of husband. HLD at goal, CVS AFIB, on Eliquis.  Continue current medications. Follow up 12 months.   Hospital visits: None in previous 6 months   Objective:  Lab Results  Component Value Date   CREATININE 0.78 05/16/2020   BUN 23 05/16/2020   GFR 73.17 05/16/2020   GFRNONAA >60 07/04/2015   GFRAA >60 07/04/2015   NA 140 05/16/2020   K 3.9 05/16/2020   CALCIUM 9.2 05/16/2020   CO2 30 05/16/2020   GLUCOSE 79 05/16/2020    Lab Results  Component Value Date/Time   HGBA1C 5.7 (H) 06/28/2015 03:10 AM   HGBA1C 5.7 10/15/2014 01:08 AM   GFR 73.17 05/16/2020 10:23 AM   GFR 65.89 12/10/2018 11:16 AM    Lab Results  Component Value Date   CHOL 157  05/16/2020   HDL 58.70 05/16/2020   LDLCALC 82 05/16/2020   LDLDIRECT 206.0 03/08/2013   TRIG 82.0 05/16/2020   CHOLHDL 3 05/16/2020    Hepatic Function Latest Ref Rng & Units 05/16/2020 12/10/2018 01/13/2018  Total Protein 6.0 - 8.3 g/dL 7.2 7.2 7.2  Albumin 3.5 - 5.2 g/dL 4.2 4.3 4.3  AST 0 - 37 U/L $Remo'23 26 22  'bcDGb$ ALT 0 - 35 U/L $Remo'22 25 23  'wqTCc$ Alk Phosphatase 39 - 117 U/L 66 63 62  Total Bilirubin 0.2 - 1.2 mg/dL 0.6 0.6 0.5  Bilirubin, Direct 0.1 - 0.5 mg/dL - - -    Lab Results  Component Value Date/Time   TSH 2.640 10/15/2014 01:08 AM    CBC Latest Ref Rng & Units 07/04/2015 07/03/2015 06/28/2015  WBC 4.0 - 10.5 K/uL 5.0 5.5 5.5  Hemoglobin 12.0 - 15.0 g/dL 13.4 14.2 13.4  Hematocrit 36.0 - 46.0 % 41.2 42.4 40.8  Platelets 150 - 400 K/uL 196 192 191    Lab Results  Component Value Date/Time   VD25OH 22 (L) 02/03/2009 03:42 AM    Clinical ASCVD: Yes  The ASCVD Risk score (Arnett DK, et al., 2019) failed to calculate for the following reasons:   The patient has a prior MI or stroke diagnosis    Depression screen [facility] 2/9 05/23/2020 12/22/2018 07/08/2017  Decreased Interest 0 0 0  Down, Depressed, Hopeless 1 0 0  PHQ - 2 Score 1 0 0  Altered sleeping  0 - 0  Tired, decreased energy 1 - 0  Change in appetite 0 - 0  Feeling bad or failure about yourself  1 - 0  Trouble concentrating 0 - 0  Moving slowly or fidgety/restless 0 - 0  Suicidal thoughts 0 - 0  PHQ-9 Score 3 - 0  Difficult doing work/chores Not difficult at all - Not difficult at all    CHA2DS2/VAS Stroke Risk: 7   1 Has Congestive Heart Failure:  Yes   0 Has Vascular Disease:  No   1 Has Hypertension:  Yes   2 Age:  70   0 Has Diabetes:  No   2 Had Stroke:  Yes  Had TIA:  No  Had Thromboembolism:  No   1 Female:  Yes    Social History   Tobacco Use  Smoking Status Never  Smokeless Tobacco Never   BP Readings from Last 3 Encounters:  12/20/20 (!) 158/86  07/27/20 (!) 152/54  06/19/20 (!) 160/76   Pulse  Readings from Last 3 Encounters:  12/20/20 (!) 59  07/27/20 (!) 56  06/19/20 (!) 53   Wt Readings from Last 3 Encounters:  12/20/20 117 lb (53.1 kg)  07/27/20 113 lb 12.8 oz (51.6 kg)  06/19/20 114 lb (51.7 kg)   BMI Readings from Last 3 Encounters:  12/20/20 19.47 kg/m  07/27/20 18.94 kg/m  06/19/20 18.97 kg/m    Assessment/Interventions: Review of patient past medical history, allergies, medications, health status, including review of consultants reports, laboratory and other test data, was performed as part of comprehensive evaluation and provision of chronic care management services.   SDOH:  (Social Determinants of Health) assessments and interventions performed: Yes SDOH Interventions    Flowsheet Row Most Recent Value  SDOH Interventions   Financial Strain Interventions Intervention Not Indicated      SDOH Screenings   Alcohol Screen: Not on file  Depression (PHQ2-9): Low Risk    PHQ-2 Score: 3  Financial Resource Strain: Low Risk    Difficulty of Paying Living Expenses: Not very hard  Food Insecurity: Not on file  Housing: Not on file  Physical Activity: Not on file  Social Connections: Not on file  Stress: Not on file  Tobacco Use: Low Risk    Smoking Tobacco Use: Never   Smokeless Tobacco Use: Never   Passive Exposure: Not on file  Transportation Needs: Not on file    [place]  Allergies  Allergen Reactions   Ciprofloxacin     REACTION: dizzy   Ciprofloxacin Nausea And Vomiting   Hydrochlorothiazide Nausea And Vomiting   Hydrochlorothiazide W-Triamterene     REACTION: nausea   Sulfa Antibiotics Nausea And Vomiting   Sulfonamide Derivatives     REACTION: nausea$RemoveBeforeDE' \\T'rxtIpoaPeGvJKSf$ \ vomiting   Valsartan     REACTION: angioedema - probable   Valsartan Nausea And Vomiting    Medications Reviewed Today     Reviewed by [name], [facility] (Pharmacist) on 02/19/21 at [phone]  Med List Status: <None>   Medication Order Taking? Sig Documenting Provider Last  Dose Status Informant  amiodarone (PACERONE) 200 MG tablet [id number] Yes TAKE 1/2 TABLET BY MOUTH DAILY Gollan, Kathlene November, MD Taking Active   amLODipine (NORVASC) 2.5 MG tablet [id number] Yes TAKE 1 TABLET BY MOUTH EVERY DAY Gollan, Kathlene November, MD Taking Active   atorvastatin (LIPITOR) 40 MG tablet [id number] Yes TAKE 1 TABLET BY MOUTH DAILY AT 6 PM. Rockey Situ, Kathlene November, MD Taking Active   Cholecalciferol 1000 UNITS  capsule [id number] Yes Take 2,000 Units by mouth daily.  [provider] Taking Active Self  CINNAMON PO [id number] Yes Take 2 tablets by mouth daily.  [provider] Taking Active Self  Coenzyme Q10-Fish Oil-Vit E (CO-Q 10 OMEGA-3 FISH OIL) CAPS [id number] Yes Take 1 capsule by mouth 2 (two) times daily. [provider] Taking Active Self  conjugated estrogens (PREMARIN) vaginal cream [id number] Yes Apply 0.$RemoveBef'5mg'SdaACvQEcg$  (pea-sized amount)  just inside the vaginal introitus with a finger-tip on  Monday, Wednesday and Friday nights. Nori Riis, PA-C Taking Active   CRANBERRY PO [id number] Yes Take 1 tablet by mouth 2 (two) times daily.  [provider] Taking Active Self  ELIQUIS 5 MG TABS tablet [id number] Yes TAKE 1 TABLET BY MOUTH TWICE A DAY Gollan, Kathlene November, MD Taking Active   ezetimibe (ZETIA) 10 MG tablet [id number] Yes TAKE 1 TABLET BY MOUTH EVERY DAY Gollan, Kathlene November, MD Taking Active   lidocaine (LIDODERM) 5 % [id number] Yes Place 1 patch onto the skin daily. Remove & Discard patch within 12 hours or as directed by MD Lesleigh Noe, MD Taking Active   LYSINE PO [id number] Yes Take by mouth daily. [provider] Taking Active   metoprolol tartrate (LOPRESSOR) 25 MG tablet [id number] Yes TAKE 0.5 TABLETS (12.5 MG TOTAL) BY MOUTH 2 (TWO) TIMES DAILY. Minna Merritts, MD Taking Active   MISC NATURAL PRODUCTS PO [id number] Yes Take 1 tablet by mouth daily. Multivitamin-Dr. Talmage Coin [provider] Taking Active Self  mupirocin cream  (BACTROBAN) 2 % [id number] Yes Apply 1 application topically 2 (two) times daily. Jinny Sanders, MD Taking Active   NON FORMULARY [id number] Yes Cell Rebuild Takes 1 tablet BID. [provider] Taking Active   NON FORMULARY [id number] Yes Canaflex 1 tablet daily. [provider] Taking Active   NON FORMULARY [id number] Yes Epivery Takes 1 tablet bid. [provider] Taking Active   Baruch Gouty [id number] Yes Limbex take one tablet daily [provider] Taking Active   NON FORMULARY [id number] Yes Sanguenol nitric oxide one tablet daily [provider] Taking Active   NON FORMULARY [id number] Yes Reprieve one tablet daily [provider] Taking Active   TURMERIC PO [id number] Yes Take by mouth 2 (two) times daily. [provider] Taking Active             Patient Active Problem List   Diagnosis Date Noted   Lymphedema 12/08/2016   Chronic venous insufficiency 10/24/2016   Urinary incontinence 06/20/2016   History of CVA (cerebrovascular accident) 06/19/2016   Eczema 03/22/2016   Chronic anticoagulation 09/11/2015   HLD (hyperlipidemia) 09/11/2015   Muscle spasticity    Hemiparesis affecting dominant side as late effect of stroke ([place])    Gait disturbance, post-stroke    Chronic systolic congestive heart failure ([place])    Counseling regarding end of life decision making 02/02/2015   Atrophic vaginitis 12/23/2014   Urethral caruncle 12/23/2014   Cystocele, midline 12/23/2014   Bradycardia 12/12/2014   Junctional rhythm 12/09/2014   Paroxysmal atrial fibrillation ([place]) 10/14/2014   Irritable bowel syndrome (IBS) 04/09/2011   POSTMENOPAUSAL STATUS 02/02/2009   HYPERTENSION, BENIGN ESSENTIAL 12/26/2006   History of rheumatoid arthritis 12/26/2006    Immunization History  Administered Date(s) Administered   Moderna Sars-Covid-2 Vaccination 10/25/2019, 11/21/2019   Td 07/13/2008    Conditions to be addressed/monitored:   Hypertension, Hyperlipidemia, and Fall risk prevention  Care Plan : [place]  Updates made  by [name], [place] since 02/19/2021 12:00 AM     Problem: CHL AMB "PATIENT-SPECIFIC PROBLEM"      Long-Range Goal: Disease Management   Start Date: 02/19/2021  Priority: High  Note:   Current Barriers:  Unable to independently monitor therapeutic efficacy - HTN Unable to achieve control of hyperlipidemia   Pharmacist Clinical Goal(s):  Patient will contact provider office for questions/concerns as evidenced notation of same in electronic health record through collaboration with PharmD and provider.   Interventions: 1:1 collaboration with Jinny Sanders, MD regarding development and update of comprehensive plan of care as evidenced by provider attestation and co-signature Inter-disciplinary care team collaboration (see longitudinal plan of care) Comprehensive medication review performed; medication list updated in electronic medical record  Hypertension (BP goal <140/90) -Not ideally controlled - per clinic readings elevated  -Goal < 140/90 due to fall risk -Current treatment: Metoprolol tartrate 25 mg - 1 tablet twice daily Amlodipine 2.5 mg - 1 tablet daily  -Medications previously tried: none   -Current home readings: she has a home monitor but has not checked blood pressure, states she is aware her BP can be high in office at times due to stress, she is trying to reduce stress (husband passed 10/2020, this has been an adjustment) -Current dietary habits: good appetite, eats 2 meals/day - hot cereal, fried egg, ham and cheese sandwich, banana; soup  -Current exercise habits: unable to exercise due to leg weakness from prior stroke  -Denies hypotensive/hypertensive symptoms -Reports last fall was 1 year ago, none recent. She uses a motorized chair that helps her from sit to stand position and uses cane outside of house. She is interested in PT for leg weakness.   -Educated on BP goals and benefits of medications for prevention of heart attack, stroke and kidney damage; -Counseled to monitor BP at home this week, document, and provide log at future appointments -Recommended to continue current medication; Check blood pressure at home this week. She is working to reduce stress by depending on family members.   Hyperlipidemia: (LDL goal < 70) -Not ideally controlled - LDL 82, HDL 58, TG 82, TC 157; Room for improvement in LDL. Pt affirms daily adherence to statin and ezetimibe.  -H/o stroke 2017 -Current treatment: Atorvastatin 40 mg - 1 tablet daily Ezetimibe 10 mg - 1 tablet daily -Medications previously tried: none   -Educated on Cholesterol goals;  -Diet - pt reports diet has improved, eating healthier lately -Recommended to continue current medication; Recommend update annual lipid panel, expecting improvement given dietary changes and daily adherence. If not, may increase statin to 80 mg daily.  History of osteoporosis(Goal Improve bone density/prevent falls) -Not ideally controlled - not up to date on recommending screening, last vitamin D low (22) -Last DEXA Scan: last DEXA 2010? Unable to find record in chart -Current treatment  Vitamin D3 2000 IU daily -Medications previously tried: per chart,not tolerant of Evista, Fosamax -Recommend 1200 mg of calcium daily from dietary and supplemental sources. Recommend weight-bearing and muscle strengthening exercises for building and maintaining bone density. -Recommended to continue current medication; Recommend resume physical therapy for leg weakness. Recommend DEXA scan.  Patient Goals/Self-Care Activities Patient will:  - take medications as prescribed as evidenced by patient report and record review check blood pressure this week, document, and provide at future appointments  Follow Up Plan: The care management team will reach out to the patient again over the next 14 days.  -CCM team will  call for BP log  in 2 weeks  -PCP visit 3 months (05/2020) -CCM follow up visit 6 months    Medication Assistance: None required.  Patient affirms current coverage meets needs.  Compliance/Adherence/Medication fill history: Care Gaps: Vaccines - Shingrix, COVID booster   Star-Rating Drugs: Medication:                Last Fill:         Day Supply Atorvastatin 40mg     10/13/20            90    (98% of total days covered for year)   Patient's preferred pharmacy is:  [id number] [place], [address] [place] [address] Phone: [phone] Fax: [phone]  [place], [address] [place] [address] Phone: [phone] Fax: [phone]  She gets some meds from mail order and others from local [facility]. Langley Gauss helps her pick them up from pharmacy if needed.  Uses pill box? No - sits her medications in juice glasses, this works better for her than pillbox Pt endorses 100% compliance to Eliquis, metoprolol, amlodipine, amiodarone, atorvastatin, ezetimibe   We discussed: Current pharmacy is preferred with insurance plan and patient is satisfied with pharmacy services  Care Plan and Follow Up Patient Decision:  Patient agrees to Care Plan and Follow-up.  [name], PharmD Clinical Pharmacist [facility] at [facility] [phone]

## 2021-02-19 NOTE — Patient Instructions (Signed)
Dear [name],  Below is a summary of the goals we discussed during our follow up appointment on February 19, 2021. Please contact me anytime with questions or concerns.   Visit Information  Patient Care Plan: CCM Pharmacy Care Plan     Problem Identified: CHL AMB "PATIENT-SPECIFIC PROBLEM"      Long-Range Goal: Disease Management   Start Date: 02/19/2021  Priority: High  Note:   Current Barriers:  Unable to independently monitor therapeutic efficacy - HTN Unable to achieve control of hyperlipidemia   Pharmacist Clinical Goal(s):  Patient will contact provider office for questions/concerns as evidenced notation of same in electronic health record through collaboration with PharmD and provider.   Interventions: 1:1 collaboration with Jinny Sanders, MD regarding development and update of comprehensive plan of care as evidenced by provider attestation and co-signature Inter-disciplinary care team collaboration (see longitudinal plan of care) Comprehensive medication review performed; medication list updated in electronic medical record  Hypertension (BP goal <140/90) -Not ideally controlled - per clinic readings elevated  -Goal < 140/90 due to fall risk -Current treatment: Metoprolol tartrate 25 mg - 1 tablet twice daily Amlodipine 2.5 mg - 1 tablet daily  -Medications previously tried: none   -Current home readings: she has a home monitor but has not checked blood pressure, states she is aware her BP can be high in office at times due to stress, she is trying to reduce stress (husband passed 10/2020, this has been an adjustment) -Current dietary habits: good appetite, eats 2 meals/day - hot cereal, fried egg, ham and cheese sandwich, banana; soup  -Current exercise habits: unable to exercise due to leg weakness from prior stroke  -Denies hypotensive/hypertensive symptoms -Reports last fall was 1 year ago, none recent. She uses a motorized chair that helps her from sit  to stand position and uses cane outside of house. She is interested in PT for leg weakness.  -Educated on BP goals and benefits of medications for prevention of heart attack, stroke and kidney damage; -Counseled to monitor BP at home this week, document, and provide log at future appointments -Recommended to continue current medication; Check blood pressure at home this week. She is working to reduce stress by depending on family members.   Hyperlipidemia: (LDL goal < 70) -Not ideally controlled - LDL 82, HDL 58, TG 82, TC 157; Room for improvement in LDL. Pt affirms daily adherence to statin and ezetimibe.  -H/o stroke 2017 -Current treatment: Atorvastatin 40 mg - 1 tablet daily Ezetimibe 10 mg - 1 tablet daily -Medications previously tried: none   -Educated on Cholesterol goals;  -Diet - pt reports diet has improved, eating healthier lately -Recommended to continue current medication; Recommend update annual lipid panel, expecting improvement given dietary changes and daily adherence. If not, may increase statin to 80 mg daily.  History of osteoporosis(Goal Improve bone density/prevent falls) -Not ideally controlled - not up to date on recommending screening, last vitamin D low (22) -Last DEXA Scan: last DEXA 2010? Unable to find record in chart -Current treatment  Vitamin D3 2000 IU daily -Medications previously tried: per chart,not tolerant of Evista, Fosamax -Recommend 1200 mg of calcium daily from dietary and supplemental sources. Recommend weight-bearing and muscle strengthening exercises for building and maintaining bone density. -Recommended to continue current medication; Recommend resume physical therapy for leg weakness. Recommend DEXA scan.  Patient Goals/Self-Care Activities Patient will:  - take medications as prescribed as evidenced by patient report and record review check blood pressure this  week, document, and provide at future appointments  Follow Up Plan: The care  management team will reach out to the patient again over the next 14 days.  -CCM team will call for BP log in 2 weeks  -PCP visit 3 months (05/2020) -CCM follow up visit 6 months      Patient verbalizes understanding of instructions provided today and agrees to view in [place].   [name], PharmD Clinical Pharmacist [facility] at [facility] [phone]

## 2021-02-20 ENCOUNTER — Other Ambulatory Visit: Payer: Self-pay | Admitting: Family Medicine

## 2021-02-20 DIAGNOSIS — E2839 Other primary ovarian failure: Secondary | ICD-10-CM

## 2021-02-20 NOTE — Progress Notes (Signed)
I place order for bone density.Marland Kitchen to  [facility].  She can call to schedule.  We can check Vit D at next lab visit.  [place] [facility]   Phone:  [place], [place] [address]                                            Services: 3D Mammogram and Bone Density

## 2021-02-21 NOTE — Telephone Encounter (Signed)
[name] notified as instructed by telephone.  She states she doesn't have anything to write phone number done currently but will call back on Monday to get that phone number.

## 2021-02-28 DIAGNOSIS — I10 Essential (primary) hypertension: Secondary | ICD-10-CM

## 2021-02-28 DIAGNOSIS — E782 Mixed hyperlipidemia: Secondary | ICD-10-CM

## 2021-03-01 NOTE — Telephone Encounter (Signed)
Spoke with Karen Bowman, she took down phone number to schedule the Bone Density scan. She states her legs seem to be a little stronger since we spoke. She is not home bound, but would not like to go out for PT right now. She will let us know if she changes her mind and would like a new referral for PT. No further action needed.  [name], PharmD Clinical Pharmacist Practitioner [facility] at [facility] [phone]

## 2021-03-12 ENCOUNTER — Telehealth: Payer: Self-pay

## 2021-03-12 NOTE — Progress Notes (Addendum)
Chronic Care Management Pharmacy Assistant   Name: Karen Bowman  MRN: [id number] DOB: 1940/11/28  Reason for Encounter: CCM (Hypertension Disease State)   Recent office visits:  02/20/2021 - Karen Lofts, MD - Orders Only - Orders placed for bone density to [facility].   Recent consult visits:  None since last CCM contact  Hospital visits:  None in previous 6 months  Medications: Outpatient Encounter Medications as of 03/12/2021  Medication Sig   amiodarone (PACERONE) 200 MG tablet TAKE 1/2 TABLET BY MOUTH DAILY   amLODipine (NORVASC) 2.5 MG tablet TAKE 1 TABLET BY MOUTH EVERY DAY   atorvastatin (LIPITOR) 40 MG tablet TAKE 1 TABLET BY MOUTH DAILY AT 6 PM.   Cholecalciferol 1000 UNITS capsule Take 2,000 Units by mouth daily.    CINNAMON PO Take 2 tablets by mouth daily.    Coenzyme Q10-Fish Oil-Vit E (CO-Q 10 OMEGA-3 FISH OIL) CAPS Take 1 capsule by mouth 2 (two) times daily.   conjugated estrogens (PREMARIN) vaginal cream Apply 0.5mg  (pea-sized amount)  just inside the vaginal introitus with a finger-tip on  Monday, Wednesday and Friday nights.   CRANBERRY PO Take 1 tablet by mouth 2 (two) times daily.    ELIQUIS 5 MG TABS tablet TAKE 1 TABLET BY MOUTH TWICE A DAY   ezetimibe (ZETIA) 10 MG tablet TAKE 1 TABLET BY MOUTH EVERY DAY   lidocaine (LIDODERM) 5 % Place 1 patch onto the skin daily. Remove & Discard patch within 12 hours or as directed by MD   LYSINE PO Take by mouth daily.   metoprolol tartrate (LOPRESSOR) 25 MG tablet TAKE 0.5 TABLETS (12.5 MG TOTAL) BY MOUTH 2 (TWO) TIMES DAILY.   MISC NATURAL PRODUCTS PO Take 1 tablet by mouth daily. Multivitamin-Dr. Talmage Coin   mupirocin cream (BACTROBAN) 2 % Apply 1 application topically 2 (two) times daily.   NON FORMULARY Cell Rebuild Takes 1 tablet BID.   NON FORMULARY Canaflex 1 tablet daily.   NON FORMULARY Epivery Takes 1 tablet bid.   NON FORMULARY Limbex take one tablet daily   NON FORMULARY Sanguenol nitric oxide  one tablet daily   NON FORMULARY Reprieve one tablet daily   TURMERIC PO Take by mouth 2 (two) times daily.   No facility-administered encounter medications on file as of 03/12/2021.   Recent Office Vitals: BP Readings from Last 3 Encounters:  12/20/20 (!) 158/86  07/27/20 (!) 152/54  06/19/20 (!) 160/76   Pulse Readings from Last 3 Encounters:  12/20/20 (!) 59  07/27/20 (!) 56  06/19/20 (!) 53    Wt Readings from Last 3 Encounters:  12/20/20 117 lb (53.1 kg)  07/27/20 113 lb 12.8 oz (51.6 kg)  06/19/20 114 lb (51.7 kg)    Kidney Function Lab Results  Component Value Date/Time   CREATININE 0.78 05/16/2020 10:23 AM   CREATININE 0.84 12/10/2018 11:16 AM   GFR 73.17 05/16/2020 10:23 AM   GFRNONAA >60 07/04/2015 03:53 AM   GFRAA >60 07/04/2015 03:53 AM   BMP Latest Ref Rng & Units 05/16/2020 12/10/2018 01/13/2018  Glucose 70 - 99 mg/dL 79 95 95  BUN 6 - 23 mg/dL 23 22 21   Creatinine 0.40 - 1.20 mg/dL 0.78 0.84 0.73  BUN/Creat Ratio 11 - 26 - - -  Sodium 135 - 145 mEq/L 140 142 142  Potassium 3.5 - 5.1 mEq/L 3.9 4.2 4.0  Chloride 96 - 112 mEq/L 104 106 106  CO2 19 - 32 mEq/L 30 30 32  Calcium  8.4 - 10.5 mg/dL 9.2 9.4 9.6   Contacted patient on 03/12/2021 to discuss hypertension disease state  Current antihypertensive regimen:  Metoprolol tartrate 25 mg - 1 tablet twice daily Amlodipine 2.5 mg - 1 tablet daily   Patient verbally confirms she is taking the above medications as directed. Yes  How often are you checking your Blood Pressure? Patient states she has not been checking her blood pressure at home. She has an arm cuff, but does not check at home. I have asked patient to take her blood pressure daily starting today and I would call back on 12/16 for her log. Patient understood and agreed.   Wrist or arm cuff: Arm Cuff Caffeine intake: Decaf Tea and Decaf Coffee Salt intake: Watches salt intake; does not add salt to food or cooking.  OTC medications including  pseudoephedrine or NSAIDs? 1 [facility] allergy pill daily for sinus issues. Tries to avoid Tylenol.   Any readings above 180/120? Patient is unsure as she does not take her blood pressure at home.   What recent interventions/DTPs have been made by any provider to improve Blood Pressure control since last CPP Visit: Recommended to continue current medication.   Any recent hospitalizations or ED visits since last visit with CPP? No  What diet changes have been made to improve Blood Pressure Control?  Patient states she does not drink caffeine or add salt to her food.   What exercise is being done to improve your Blood Pressure Control?  Patient states she does not do daily exercising, but she is moving around more than previously was. Patient uses a push cart that she uses in replace of a walker in the house to help stabilize her.   Adherence Review: Is the patient currently on ACE/ARB medication? No Does the patient have >5 day gap between last estimated fill dates? Yes  Star Rating Drugs:  Medication:  Last Fill: Day Supply Atorvastatin 40 mg    10/13/2020 90     Fill date verified with [facility]  Pt affirms daily adherence, she has adequate supply on hand.  Care Gaps: Annual wellness visit in last year? Yes 05/23/2020 Most Recent BP reading: 158/86 on 12/20/2020   No appointments scheduled within the next 30 days.  [name], CPP notified  [name], [place] Clinical Pharmacy Assistant [phone]  I have reviewed the care management and care coordination activities outlined in this encounter and I am certifying that I agree with the content of this note.   [name], PharmD Clinical Pharmacist [name] [facility] at [facility] [phone]

## 2021-03-16 NOTE — Progress Notes (Signed)
Called patient for her blood pressure readings. Patient stated she has not been taking her blood pressure. I asked patient to start taking it as of today and I would call her back on 12/22 for her readings. Patient understood and agreed.   [name], CPP notified  [name], [place] Clinical Pharmacy Assistant [phone]  Time Spent: 5 Minutes

## 2021-03-22 NOTE — Progress Notes (Signed)
Called patient to get her blood pressure readings as previously discussed. Patient stated she has not been taking her blood pressure. Patient asked for me to call her back. I advised patient I would call her back on 12/29 for her readings.   [name], CPP notified  [name], [place] Clinical Pharmacy Assistant [phone]  Time Spent: 3 Minutes

## 2021-03-30 NOTE — Progress Notes (Signed)
Called patient to get her blood pressure readings. Patient did not answer; left a message.  [name], CPP notified  [name], [place] Clinical Pharmacy Assistant [phone]

## 2021-04-04 ENCOUNTER — Other Ambulatory Visit: Payer: Self-pay | Admitting: Cardiovascular Disease

## 2021-04-04 ENCOUNTER — Telehealth (INDEPENDENT_AMBULATORY_CARE_PROVIDER_SITE_OTHER): Payer: Medicare Other | Admitting: Family Medicine

## 2021-04-04 ENCOUNTER — Encounter: Payer: Self-pay | Admitting: Family Medicine

## 2021-04-04 ENCOUNTER — Other Ambulatory Visit: Payer: Self-pay

## 2021-04-04 VITALS — BP 182/69 | HR 58 | Ht 65.0 in

## 2021-04-04 DIAGNOSIS — U071 COVID-19: Secondary | ICD-10-CM | POA: Diagnosis not present

## 2021-04-04 DIAGNOSIS — Z79899 Other long term (current) drug therapy: Secondary | ICD-10-CM

## 2021-04-04 MED ORDER — MOLNUPIRAVIR EUA 200MG CAPSULE: 4.0000 | ORAL_CAPSULE | Freq: Two times a day (BID) | ORAL | 0 refills | Status: AC

## 2021-04-04 MED ORDER — BENZONATATE 100 MG PO CAPS: 100.0000 mg | ORAL_CAPSULE | Freq: Two times a day (BID) | ORAL | 2 refills | Status: DC | PRN

## 2021-04-04 NOTE — Progress Notes (Signed)
Karen Bowman T. Karen Coller, MD Primary Care and Sports Medicine [facility] at [facility] [place], [address] Phone: [phone]   FAX: [phone]  Karen Bowman - 81 y.o. female   MRN [id number]   Date of Birth: [birth year]  Visit Date: 04/04/2021   PCP: Karen Sanders, MD   Referred by: Karen Sanders, MD  Virtual Visit via Video Note:  I connected with  Karen Bowman on 04/04/2021 11:40 AM EST by a video enabled telemedicine application and verified that I am speaking with the correct person using two identifiers.   Location patient: home computer, tablet, or smartphone Location provider: work or home office Consent: Verbal consent directly obtained from Karen Bowman. Persons participating in the virtual visit: patient, provider  I discussed the limitations of evaluation and management by telemedicine and the availability of in person appointments. The patient expressed understanding and agreed to proceed.  Chief Complaint  Patient presents with   Covid Positive    Positive home test on Sunday-Symptoms started on Friday   Cough   Headache   Fatigue   Nasal Congestion   Ear Fullness    History of Present Illness:  Day 5 today. Refer to CC.  Started to feel really sick.  Saturday could not get out of bed.  Getting a little bit better since then.  Still has a really bad cough.   Very lound and awful. Had a lot of congestion. Has had some sinus drainage. HA fom much of the time. Not every second.  Worn out.  A bit of a shock.   No GI symptoms  Feeling really bad over the weekend.     Immunization History  Administered Date(s) Administered   [employer] Vaccination 10/25/2019, 11/21/2019   Td 07/13/2008     Review of Systems as above: See pertinent positives and pertinent negatives per HPI No acute distress verbally   Observations/Objective/Exam:  An attempt was made to discern vital signs over the  phone and per patient if applicable and possible.   General:    Alert, Oriented, appears well and in no acute distress  Pulmonary:     On inspection no signs of respiratory distress.  Psych / Neurological:     Pleasant and cooperative.  Assessment and Plan:    ICD-10-CM   1. COVID-19  U07.1     2. Long term current use of amiodarone  Z79.899      Confirmed COVID in a 81 year old patient with multiple medical problems by history.  I am going to place the patient on antivirals.  Recommended that she could use Robitussin plain or Tessalon for her cough.  I discussed the assessment and treatment plan with the patient. The patient was provided an opportunity to ask questions and all were answered. The patient agreed with the plan and demonstrated an understanding of the instructions.   The patient was advised to call back or seek an in-person evaluation if the symptoms worsen or if the condition fails to improve as anticipated.  Follow-up: prn unless noted otherwise below No follow-ups on file.  Meds ordered this encounter  Medications   molnupiravir EUA (LAGEVRIO) 200 mg CAPS capsule    Sig: Take 4 capsules (800 mg total) by mouth 2 (two) times daily for 5 days.    Dispense:  40 capsule    Refill:  0   benzonatate (TESSALON) 100 MG capsule    Sig: Take 1  capsule (100 mg total) by mouth 2 (two) times daily as needed for cough.    Dispense:  40 capsule    Refill:  2   No orders of the defined types were placed in this encounter.   Signed,  Karen Deed. Tranae Laramie, MD

## 2021-04-09 ENCOUNTER — Telehealth: Payer: Self-pay | Admitting: Family Medicine

## 2021-04-09 NOTE — Telephone Encounter (Signed)
Due to she is still coughing. She said that she is feeling better she just need another round to kick it.

## 2021-04-09 NOTE — Telephone Encounter (Signed)
Mr. Kimmer called in for his mom and stated that Mrs. Peery wanted to know if she can get a refill on the cough medicine.

## 2021-04-10 ENCOUNTER — Telehealth: Payer: Self-pay

## 2021-04-10 NOTE — Telephone Encounter (Signed)
I spoke with Ms. Joens.  She states she is still having coughing fits and is coughing up light yellow phlegm.  She is asking for a refill on [place] but I advised her that you do not do an extended dose of the antivirals.  Patient kept referring to the medication as an antibiotic.  She is currently using Robitussin DM and Benzonatate for cough.  She is currently scheduled with Dr. Lorelei Pont for a virtual visit on 04/11/2021.  I recommended that she keep that appointment as schedule to discuss her cough.  Looks like Mearl Latin has also spoken with her son.  See phone encounter from today.   She does not need refill on the benzonatate.  Patient was given #40 with 2 refills on 04/04/21.    FYI to Dr. Diona Browner and Dr. Lorelei Pont.

## 2021-04-10 NOTE — Telephone Encounter (Signed)
[place] [facility] - Client TELEPHONE ADVICE RECORD [facility] Patient Name: Karen Bowman Gender: Female DOB: 1940-05-09 Age: 81 Y 61 M 2 D Return Phone Number: [phone] (Primary), [phone] (Secondary) Address: City/ State/ Zip: [place] [address] Client [facility] - Client Client Site [place] - Day Provider [name] - MD Contact Type Call Who Is Calling Patient / Member / Family / Caregiver Call Type Triage / Clinical Caller Name [name] Relationship To Patient Son Return Phone Number [phone] (Primary) Chief Complaint Nosebleed Reason for Call Symptomatic / Request for [place] states his mother had Covid last week and has finished the medication but still has a cough, nasal congestion, and her nose is currently bleeding. Translation No Nurse Assessment Nurse: Doren Custard, RN, Caryl Pina Date/Time (Eastern Time): 04/10/2021 1:56:47 PM Confirm and document reason for call. If symptomatic, describe symptoms. ---Caller states his mom tested positive for Covid on Jan. 1. 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 She still has a cough, congestion, and a nosebleed. Does the patient have any new or worsening symptoms? ---Yes Will a triage be completed? ---Yes Related visit to physician within the last 2 weeks? ---Yes Does the PT have any chronic conditions? (i.e. diabetes, asthma, this includes High risk factors for pregnancy, etc.) ---Yes List chronic conditions. ---heart attack, stroke Is this a behavioral health or substance abuse call? ---No Guidelines Guideline Title Affirmed Question Affirmed Notes Nurse Date/Time ([place] Time) COVID-19 - Diagnosed or Suspected [1] HIGH RISK for severe COVID complications (e.g., weak immune system, age > 33 years, obesity with BMI 30 or higher, Dixon, RN, Caryl Pina 04/10/2021 2:00:06 PM PLEASE  NOTE: All timestamps contained within this report are represented as [place] Standard Time. CONFIDENTIALTY NOTICE: This fax transmission is intended only for the addressee. It contains information that is legally privileged, confidential or otherwise protected from use or disclosure. If you are not the intended recipient, you are strictly prohibited from reviewing, disclosing, copying using or disseminating any of this information or taking any action in reliance on or regarding this information. If you have received this fax in error, please notify us immediately by telephone so that we can arrange for its return to [place]. Phone: [phone], Toll-Free: [phone], Fax: [phone] Page: 2 of 2 Call Id: [id number] Guidelines Guideline Title Affirmed Question Affirmed Notes Nurse Date/Time Eilene Ghazi Time) pregnant, chronic lung disease or other chronic medical condition) AND [2] COVID symptoms (e.g., cough, fever) (Exceptions: Already seen by PCP and no new or worsening symptoms.) Disp. Time Eilene Ghazi Time) Disposition Final User 04/10/2021 2:06:14 PM Call PCP within 24 Hours Yes Doren Custard, RN, Hulan Saas Disagree/Comply Comply Caller Understands Yes PreDisposition Call Doctor Care Advice Given Per Guideline CALL PCP WITHIN 24 HOURS: * You need to discuss this with your doctor (or NP/PA) within the next 24 hours. CALL BACK IF: * You become worse CARE ADVICE given per COVID-19 - DIAGNOSED OR SUSPECTED (Adult) guideline. Comments User: Dorcas Carrow, RN Date/Time Eilene Ghazi Time): 04/10/2021 2:03:45 PM O2 is 94%. BP is 186/80. Caller states she has a wet cough. O2 is now 98%. Referrals REFERRED TO PCP OFFIC

## 2021-04-10 NOTE — Telephone Encounter (Signed)
I spoke with Elta Guadeloupe (DPR signed) pt had video visit on 04/04/21; pt said thinks + covid on 04/01/21; today 04/10/21 pt retested home covid test that was neg. Pt has dry cough and Elta Guadeloupe is not sure if any wheezing or not; head congestion and when blows nose mucus is white with blood tinge. Elta Guadeloupe said pt has not had a nosebleed. Pt has no fever and no CP or SOB and pt is not in any distress with breathing. Pt already has VV scheduled on 04/11/21 at 9 AM with Dr Lorelei Pont. UC & ED precautions given and Elta Guadeloupe voiced understanding. Sending note to Dr Lorelei Pont and Butch Penny CMA.

## 2021-04-10 NOTE — Telephone Encounter (Signed)
Okay to refill the benzonatate if that is the medication she is referring to.

## 2021-04-10 NOTE — Telephone Encounter (Signed)
Noted. No further antivirals needed. Has appt to see Dr. Loletha Grayer tommorow.

## 2021-04-11 ENCOUNTER — Encounter: Payer: Self-pay | Admitting: Family Medicine

## 2021-04-11 ENCOUNTER — Other Ambulatory Visit: Payer: Self-pay

## 2021-04-11 ENCOUNTER — Telehealth (INDEPENDENT_AMBULATORY_CARE_PROVIDER_SITE_OTHER): Payer: Medicare Other | Admitting: Family Medicine

## 2021-04-11 VITALS — BP 195/76 | HR 84 | Ht 65.0 in | Wt 114.0 lb

## 2021-04-11 DIAGNOSIS — J208 Acute bronchitis due to other specified organisms: Secondary | ICD-10-CM

## 2021-04-11 DIAGNOSIS — U071 COVID-19: Secondary | ICD-10-CM | POA: Diagnosis not present

## 2021-04-11 MED ORDER — DOXYCYCLINE HYCLATE 100 MG PO TABS: 100.0000 mg | ORAL_TABLET | Freq: Two times a day (BID) | ORAL | 0 refills | Status: DC

## 2021-04-11 NOTE — Telephone Encounter (Signed)
Pt had VV with Dr Lorelei Pont today.

## 2021-04-11 NOTE — Progress Notes (Signed)
Karen Easler T. [name], MD Primary Care and Sports Medicine [facility] at [facility] [place], [address] Phone: [phone]   FAX: [phone]  Karen Bowman - 81 y.o. female   MRN [id number]   Date of Birth: [birth year]  Visit Date: 04/11/2021   PCP: Jinny Sanders, MD   Referred by: Jinny Sanders, MD  Virtual Visit via Video Note:  I connected with  Karen Bowman on 04/11/2021  9:00 AM EST by a video enabled telemedicine application and verified that I am speaking with the correct person using two identifiers.   Location patient: home computer, tablet, or smartphone Location provider: work or home office Consent: Verbal consent directly obtained from Karen Bowman. Persons participating in the virtual visit: patient, provider  I discussed the limitations of evaluation and management by telemedicine and the availability of in person appointments. The patient expressed understanding and agreed to proceed.  Chief Complaint  Patient presents with   Cough    Seen on 04/04/21 for Covid 19    History of Present Illness:  Started symptoms are 12/29, positive 04/02/2021.  We did a prior visit and I gave her some Molnupiravir.  She is worried some that her cough is continued.  Coughing still and having a really wet cough.  Also is taking some Robitussin.  Also with some Tessalon.  Some discharge.  This is nasal discharge.  No fever.  Does feel better and eating alright.  Has had some loose stools.   Has some pressure and pain around her L maxillary sinus.    Review of Systems as above: See pertinent positives and pertinent negatives per HPI No acute distress verbally   Observations/Objective/Exam:  An attempt was made to discern vital signs over the phone and per patient if applicable and possible.   General:    Alert, Oriented, appears well and in no acute distress  Pulmonary:     On inspection no signs of respiratory  distress.  Psych / Neurological:     Pleasant and cooperative.  Assessment and Plan:    ICD-10-CM   1. Acute bronchitis due to other specified organisms  J20.8     2. COVID-19  U07.1      I reviewed with the patient and her daughter, this is difficult to differentiate between COVID-pneumonia versus secondary bacterial pneumonia.  We discussed Molnupiravir and antivirals in general.  Given her risk factors, I am going to go ahead and treat her for a secondary pneumonia with some antibiotics.  Does not help at all, then thing that you can feel pretty certain that this is purely COVID-pneumonia that is recovering.  On amiodarone and Eliquis, no interactions.  I discussed the assessment and treatment plan with the patient. The patient was provided an opportunity to ask questions and all were answered. The patient agreed with the plan and demonstrated an understanding of the instructions.   The patient was advised to call back or seek an in-person evaluation if the symptoms worsen or if the condition fails to improve as anticipated.  Follow-up: prn unless noted otherwise below No follow-ups on file.  Meds ordered this encounter  Medications   doxycycline (VIBRA-TABS) 100 MG tablet    Sig: Take 1 tablet (100 mg total) by mouth 2 (two) times daily.    Dispense:  20 tablet    Refill:  0   No orders of the defined types were placed in this encounter.  Signed,  Maud Deed. Eliyahu Bille, MD

## 2021-04-29 ENCOUNTER — Other Ambulatory Visit: Payer: Self-pay | Admitting: Cardiovascular Disease

## 2021-04-30 NOTE — Telephone Encounter (Deleted)
Eliquis 5 mg refill request received. Patient is 81 years old, weight-51.7 kg, Crea- 0.78 on 05/16/20, Diagnosis- afib, and last seen by Dr. Rockey Situ on 12/20/20. Dose is appropriate based on dosing criteria. Will send in refill to requested pharmacy.

## 2021-04-30 NOTE — Telephone Encounter (Signed)
Refill request

## 2021-04-30 NOTE — Telephone Encounter (Signed)
Prescription refill request for Eliquis received. Indication: PAF Last office visit: 12/20/20  Karen Bridge MD Scr: 0.78 on 05/16/20 Age: 81 Weight: 53.1kg  Based on above findings Eliquis 5mg  twice daily is the appropriate dose.  Patient is past due for lab work.  Message sent to nurse to schedule CBC/BMP for future Eliquis refills.  Refill approved x 1.

## 2021-05-08 ENCOUNTER — Telehealth: Payer: Self-pay | Admitting: Family Medicine

## 2021-05-08 DIAGNOSIS — E7800 Pure hypercholesterolemia, unspecified: Secondary | ICD-10-CM

## 2021-05-08 NOTE — Telephone Encounter (Signed)
-----   Message from Ellamae Sia sent at 05/07/2021  8:14 AM EST ----- Regarding: Lab orders for Monday, 2.20.23  AWV lab orders, please.

## 2021-05-21 ENCOUNTER — Other Ambulatory Visit (INDEPENDENT_AMBULATORY_CARE_PROVIDER_SITE_OTHER): Payer: Medicare Other

## 2021-05-21 ENCOUNTER — Other Ambulatory Visit: Payer: Self-pay

## 2021-05-21 DIAGNOSIS — E7800 Pure hypercholesterolemia, unspecified: Secondary | ICD-10-CM | POA: Diagnosis not present

## 2021-05-21 LAB — COMPREHENSIVE METABOLIC PANEL
ALT: 21 U/L (ref 0–35)
AST: 21 U/L (ref 0–37)
Albumin: 4.2 g/dL (ref 3.5–5.2)
Alkaline Phosphatase: 71 U/L (ref 39–117)
BUN: 25 mg/dL — ABNORMAL HIGH (ref 6–23)
CO2: 31 mEq/L (ref 19–32)
Calcium: 9.7 mg/dL (ref 8.4–10.5)
Chloride: 103 mEq/L (ref 96–112)
Creatinine, Ser: 0.85 mg/dL (ref 0.40–1.20)
GFR: 65.53 mL/min (ref >60.00)
Glucose, Bld: 91 mg/dL (ref 70–99)
Potassium: 3.9 mEq/L (ref 3.5–5.1)
Sodium: 138 mEq/L (ref 135–145)
Total Bilirubin: 0.6 mg/dL (ref 0.2–1.2)
Total Protein: 7.5 g/dL (ref 6.0–8.3)

## 2021-05-21 LAB — LIPID PANEL
Cholesterol: 156 mg/dL (ref 0–200)
HDL: 54 mg/dL (ref >39.00)
LDL Cholesterol: 90 mg/dL (ref 0–99)
NonHDL: 102.41
Total CHOL/HDL Ratio: 3
Triglycerides: 63 mg/dL (ref 0.0–149.0)
VLDL: 12.6 mg/dL (ref 0.0–40.0)

## 2021-05-22 NOTE — Progress Notes (Signed)
No critical labs need to be addressed urgently. We will discuss labs in detail at upcoming office visit.   

## 2021-05-25 ENCOUNTER — Other Ambulatory Visit: Payer: Self-pay

## 2021-05-25 ENCOUNTER — Ambulatory Visit (INDEPENDENT_AMBULATORY_CARE_PROVIDER_SITE_OTHER): Payer: Medicare Other | Admitting: Family Medicine

## 2021-05-25 ENCOUNTER — Encounter: Payer: Self-pay | Admitting: Family Medicine

## 2021-05-25 VITALS — BP 140/73 | HR 75 | Temp 98.1°F | Ht 64.0 in | Wt 114.6 lb

## 2021-05-25 DIAGNOSIS — Z0000 Encounter for general adult medical examination without abnormal findings: Secondary | ICD-10-CM

## 2021-05-25 DIAGNOSIS — I5022 Chronic systolic (congestive) heart failure: Secondary | ICD-10-CM

## 2021-05-25 DIAGNOSIS — I69359 Hemiplegia and hemiparesis following cerebral infarction affecting unspecified side: Secondary | ICD-10-CM

## 2021-05-25 DIAGNOSIS — I10 Essential (primary) hypertension: Secondary | ICD-10-CM

## 2021-05-25 DIAGNOSIS — L989 Disorder of the skin and subcutaneous tissue, unspecified: Secondary | ICD-10-CM

## 2021-05-25 DIAGNOSIS — E7800 Pure hypercholesterolemia, unspecified: Secondary | ICD-10-CM

## 2021-05-25 DIAGNOSIS — I480 Paroxysmal atrial fibrillation: Secondary | ICD-10-CM

## 2021-05-25 NOTE — Patient Instructions (Addendum)
Get back to low cholesterol diet and regualr exercise Please call the location of your choice from the menu below to schedule Bone Density appointment.      [place] at [facility]   Phone:  [phone]   [place], [place] [address]                                            Services: 3D Mammogram and [place]  [place] at [facility])  Phone:  [phone]   [address] [place], [place] [address]                                              Services:  3D Mammogram and Bone Density

## 2021-05-25 NOTE — Assessment & Plan Note (Signed)
Stable, uses cane to ambulate 

## 2021-05-25 NOTE — Progress Notes (Signed)
Patient ID: Karen Bowman, female    DOB: May 09, 1940, 81 y.o.   MRN: [id number]  This visit was conducted in person.  BP 140/73    Pulse 75    Temp 98.1 F (36.7 C) (Temporal)    Ht 5\' 4"  (1.626 m)    Wt 114 lb 9 oz (52 kg)    LMP  (LMP Unknown)    SpO2 99%    BMI 19.66 kg/m    CC: Chief Complaint  Patient presents with   Medicare Wellness    Subjective:   HPI: Karen Bowman is a 81 y.o. female presenting on 05/25/2021 for Medicare Wellness   The patient presents for annual medicare wellness, complete physical and review of chronic health problems. He/She also has the following acute concerns today: She is doing better now... Had COVID and bronchitis in 04/2021, symptoms now resolved.  I have personally reviewed the Medicare Annual Wellness questionnaire and have noted 1. The patient's medical and social history 2. Their use of alcohol, tobacco or illicit drugs 3. Their current medications and supplements 4. The patient's functional ability including ADL's, fall risks, home safety risks and hearing or visual             impairment. 5. Diet and physical activities 6. Evidence for depression or mood disorders 7.         Updated provider list Cognitive evaluation was performed and recorded on pt medicare questionnaire form. The patients weight, height, BMI and visual acuity have been recorded in the chart   I have made referrals, counseling and provided education to the patient based review of the above and I have provided the pt with a written personalized care plan for preventive services.   Documentation of this information was scanned into the electronic record under the media tab.   Advance directives and end of life planning reviewed in detail with patient and documented in EMR. Patient given handout on advance care directives if needed. HCPOA and living will updated if needed.  Hearing Screening  Method: Audiometry   500Hz  1000Hz  2000Hz  4000Hz   Right ear 0 0 0 0   Left ear 0 0 0 0   Vision Screening   Right eye Left eye Both eyes  Without correction 20/50 20/50 20/50   With correction       No falls  [place] Visit from 05/25/2021 in [place] at [facility]  PHQ-2 Total Score 0      Hypertension:   Well controlled on amlodipine  And lopressor             BP Readings from Last 3 Encounters:  05/25/21 140/73  04/11/21 (!) 195/76  04/04/21 (!) 182/69  Using medication without problems or lightheadedness:  none Chest pain with exertion:none Edema:none Short of breath:none Average home BPs: Other issues:  Elevated Cholesterol: LDL almost at goal < 70 on atorvastatin 40 mg daily and zetia 10 mg daily. Lab Results  Component Value Date   CHOL 156 05/21/2021   HDL 54.00 05/21/2021   LDLCALC 90 05/21/2021   LDLDIRECT 206.0 03/08/2013   TRIG 63.0 05/21/2021   CHOLHDL 3 05/21/2021  Using medications without problems: Muscle aches:  Diet compliance:  moderate Exercise: use cane Other complaints: Wt Readings from Last 3 Encounters:  05/25/21 114 lb 9 oz (52 kg)  04/11/21 114 lb (51.7 kg)  12/20/20 117 lb (53.1 kg)        CAD, PAD, CHF second degree AV  block: followed by Dr. Rockey Situ... reviewed last note from 12/20/2020, next appt in 11/2021  On eliquis, amlodipine , amiodarone and metoprolol, aggressive cholesterol control.  Euvolemic today in office HX of CVA  Relevant past medical, surgical, family and social history reviewed and updated as indicated. Interim medical history since our last visit reviewed. Allergies and medications reviewed and updated. Outpatient Medications Prior to Visit  Medication Sig Dispense Refill   amiodarone (PACERONE) 200 MG tablet TAKE 1/2 TABLET BY MOUTH DAILY 45 tablet 0   amLODipine (NORVASC) 2.5 MG tablet TAKE 1 TABLET BY MOUTH EVERY DAY 90 tablet 3   apixaban (ELIQUIS) 5 MG TABS tablet TAKE 1 TABLET BY MOUTH TWICE A DAY 60 tablet 0   atorvastatin (LIPITOR) 40 MG tablet TAKE 1  TABLET BY MOUTH DAILY AT 6 PM. 90 tablet 0   Cholecalciferol 1000 UNITS capsule Take 2,000 Units by mouth daily.      CINNAMON PO Take 2 tablets by mouth daily.      Coenzyme Q10-Fish Oil-Vit E (CO-Q 10 OMEGA-3 FISH OIL) CAPS Take 1 capsule by mouth 2 (two) times daily.     conjugated estrogens (PREMARIN) vaginal cream Apply 0.5mg  (pea-sized amount)  just inside the vaginal introitus with a finger-tip on  Monday, Wednesday and Friday nights. 30 g 12   CRANBERRY PO Take 1 tablet by mouth 2 (two) times daily.      ezetimibe (ZETIA) 10 MG tablet TAKE 1 TABLET BY MOUTH EVERY DAY 90 tablet 3   lidocaine (LIDODERM) 5 % Place 1 patch onto the skin daily. Remove & Discard patch within 12 hours or as directed by MD 10 patch 0   LYSINE PO Take by mouth daily.     metoprolol tartrate (LOPRESSOR) 25 MG tablet TAKE 0.5 TABLETS BY MOUTH 2 TIMES DAILY. 90 tablet 2   MISC NATURAL PRODUCTS PO Take 1 tablet by mouth daily. Multivitamin-Dr. Talmage Coin     mupirocin cream (BACTROBAN) 2 % Apply 1 application topically 2 (two) times daily. 15 g 0   NON FORMULARY Cell Rebuild Takes 1 tablet BID.     NON FORMULARY Canaflex 1 tablet daily.     NON FORMULARY Epivery Takes 1 tablet bid.     NON FORMULARY Limbex take one tablet daily     NON FORMULARY Sanguenol nitric oxide one tablet daily     NON FORMULARY Reprieve one tablet daily     TURMERIC PO Take by mouth 2 (two) times daily.     benzonatate (TESSALON) 100 MG capsule Take 1 capsule (100 mg total) by mouth 2 (two) times daily as needed for cough. 40 capsule 2   doxycycline (VIBRA-TABS) 100 MG tablet Take 1 tablet (100 mg total) by mouth 2 (two) times daily. 20 tablet 0   No facility-administered medications prior to visit.     Per HPI unless specifically indicated in ROS section below Review of Systems  Constitutional:  Negative for fatigue and fever.  HENT:  Negative for congestion.   Eyes:  Negative for pain.  Respiratory:  Negative for cough and shortness  of breath.   Cardiovascular:  Negative for chest pain, palpitations and leg swelling.  Gastrointestinal:  Negative for abdominal pain.  Genitourinary:  Negative for dysuria and vaginal bleeding.  Musculoskeletal:  Negative for back pain.  Neurological:  Negative for syncope, light-headedness and headaches.  Psychiatric/Behavioral:  Negative for dysphoric mood.   Objective:  BP 140/73    Pulse 75    Temp 98.1 F (36.7  C) (Temporal)    Ht 5\' 4"  (1.626 m)    Wt 114 lb 9 oz (52 kg)    LMP  (LMP Unknown)    SpO2 99%    BMI 19.66 kg/m   Wt Readings from Last 3 Encounters:  05/25/21 114 lb 9 oz (52 kg)  04/11/21 114 lb (51.7 kg)  12/20/20 117 lb (53.1 kg)      Physical Exam Constitutional:      General: She is not in acute distress.    Appearance: Normal appearance. She is well-developed. She is not ill-appearing or toxic-appearing.  HENT:     Head: Normocephalic.     Right Ear: Hearing, tympanic membrane, ear canal and external ear normal. Tympanic membrane is not erythematous, retracted or bulging.     Left Ear: Hearing, tympanic membrane, ear canal and external ear normal. Tympanic membrane is not erythematous, retracted or bulging.     Nose: No mucosal edema or rhinorrhea.     Right Sinus: No maxillary sinus tenderness or frontal sinus tenderness.     Left Sinus: No maxillary sinus tenderness or frontal sinus tenderness.     Mouth/Throat:     Pharynx: Uvula midline.  Eyes:     General: Lids are normal. Lids are everted, no foreign bodies appreciated.     Conjunctiva/sclera: Conjunctivae normal.     Pupils: Pupils are equal, round, and reactive to light.  Neck:     Thyroid: No thyroid mass or thyromegaly.     Vascular: No carotid bruit.     Trachea: Trachea normal.  Cardiovascular:     Rate and Rhythm: Normal rate and regular rhythm.     Pulses: Normal pulses.     Heart sounds: Normal heart sounds, S1 normal and S2 normal. No murmur heard.   No friction rub. No gallop.   Pulmonary:     Effort: Pulmonary effort is normal. No tachypnea or respiratory distress.     Breath sounds: Normal breath sounds. No decreased breath sounds, wheezing, rhonchi or rales.  Abdominal:     General: Bowel sounds are normal.     Palpations: Abdomen is soft.     Tenderness: There is no abdominal tenderness.  Musculoskeletal:     Cervical back: Normal range of motion and neck supple.  Skin:    General: Skin is warm and dry.     Findings: No rash.     Comments: Skin lesion raised skin colored/slightly hyperpigmented, rough above left eye.Marland Kitchen appear similar to picture taken 1 year ago.. slightly larger and less red.  Neurological:     Mental Status: She is alert.  Psychiatric:        Mood and Affect: Mood is not anxious or depressed.        Speech: Speech normal.        Behavior: Behavior normal. Behavior is cooperative.        Thought Content: Thought content normal.        Judgment: Judgment normal.      Results for orders placed or performed in visit on 05/21/21  Comprehensive metabolic panel  Result Value Ref Range   Sodium 138 135 - 145 mEq/L   Potassium 3.9 3.5 - 5.1 mEq/L   Chloride 103 96 - 112 mEq/L   CO2 31 19 - 32 mEq/L   Glucose, Bld 91 70 - 99 mg/dL   BUN 25 (H) 6 - 23 mg/dL   Creatinine, Ser 0.85 0.40 - 1.20 mg/dL   Total Bilirubin 0.6  0.2 - 1.2 mg/dL   Alkaline Phosphatase 71 39 - 117 U/L   AST 21 0 - 37 U/L   ALT 21 0 - 35 U/L   Total Protein 7.5 6.0 - 8.3 g/dL   Albumin 4.2 3.5 - 5.2 g/dL   GFR 65.53 >60.00 mL/min   Calcium 9.7 8.4 - 10.5 mg/dL  Lipid panel  Result Value Ref Range   Cholesterol 156 0 - 200 mg/dL   Triglycerides 63.0 0.0 - 149.0 mg/dL   HDL 54.00 >39.00 mg/dL   VLDL 12.6 0.0 - 40.0 mg/dL   LDL Cholesterol 90 0 - 99 mg/dL   Total CHOL/HDL Ratio 3    NonHDL 102.41     This visit occurred during the SARS-CoV-2 public health emergency.  Safety protocols were in place, including screening questions prior to the visit, additional  usage of staff PPE, and extensive cleaning of exam room while observing appropriate contact time as indicated for disinfecting solutions.   COVID 19 screen:  No recent travel or known exposure to COVID19 The patient denies respiratory symptoms of COVID 19 at this time. The importance of social distancing was discussed today.   Assessment and Plan The patient's preventative maintenance and recommended screening tests for an annual wellness exam were reviewed in full today. Brought up to date unless services declined.  Counselled on the importance of diet, exercise, and its role in overall health and mortality. The patient's FH and SH was reviewed, including their home life, tobacco status, and drug and alcohol status.   DEXA done in 2010.Marland Kitchen Not interested in repeating. Vaccines:Due for PNA and shingles. SE to flu vaccine in past.  She is not interested.  COVID vaccine: 11/21/2019 , , plans to get booster Due for mammogram,never went last year, not interested in repeating. DVE/pap: not indicated, s/p hysterectomy Colon:never sent back ifob 2014, 2015 or 2016.Marland Kitchen Refused to complete... no further indicated after age 65.   Problem List Items Addressed This Visit     HYPERTENSION, BENIGN ESSENTIAL (Chronic)   Chronic systolic congestive heart failure ([place])    Euvolemic in office today.      Hemiparesis affecting dominant side as late effect of stroke (HCC)    Stable, uses cane to ambulate      HLD (hyperlipidemia)    Stable, chronic.  Continue current medication.   LDL almost at goal < 70 on atorvastatin 40 mg and zetia 10 mg daily. She will get back on track with low chol diet.       Paroxysmal atrial fibrillation (HCC)    Rate control and on anticoagulation.      Other Visit Diagnoses     Medicare annual wellness visit, subsequent    -  Primary   Skin lesion of face       Relevant Orders   Ambulatory referral to Dermatology        [name], MD

## 2021-05-25 NOTE — Assessment & Plan Note (Signed)
Stable, chronic.  Continue current medication.   LDL almost at goal < 70 on atorvastatin 40 mg and zetia 10 mg daily. She will get back on track with low chol diet.  

## 2021-05-25 NOTE — Assessment & Plan Note (Signed)
Rate control and on anticoagulation.

## 2021-05-25 NOTE — Assessment & Plan Note (Signed)
Euvolemic in office today 

## 2021-06-12 ENCOUNTER — Telehealth: Payer: Self-pay

## 2021-06-12 NOTE — Progress Notes (Signed)
Transition CCM to Self Care ? ?Patient contacted to inform they have achieved their CCM goals and no longer need to be contacted as frequently. Patient advised services will still be available to them if they would like to reach out or have any new health concerns. Verified patient had contact information to pharmacist and health concierge on hand. Patient made aware CCM services would be continued if desired. Patient consented to cancel future CCM appointments. ? ?Charlene Brooke, CPP notified ? ?[name], RMA ?Clinical Pharmacy Assistant ?[phone] ?

## 2021-07-02 ENCOUNTER — Other Ambulatory Visit: Payer: Self-pay | Admitting: Cardiovascular Disease

## 2021-07-02 NOTE — Telephone Encounter (Signed)
Prescription refill request for Eliquis received. ?Indication:  PAF ?Last office visit: 12/20/20  Johnny Bridge MD ?Scr: 0.85 on 05/21/21 ?Age: 81 ?Weight: 53.1kg ? ?Based on above findings Eliquis '5mg'$  twice daily is the appropriate dose.  Refill approved. ? ?

## 2021-07-02 NOTE — Telephone Encounter (Signed)
Refill request

## 2021-07-06 ENCOUNTER — Other Ambulatory Visit: Payer: Self-pay | Admitting: Cardiovascular Disease

## 2021-07-17 ENCOUNTER — Telehealth: Payer: Medicare Other

## 2021-07-18 ENCOUNTER — Telehealth: Payer: Medicare Other

## 2021-07-30 NOTE — Progress Notes (Signed)
? ?04/23/2018 ?2:08 PM  ? ?Karen Bowman ?1940/06/21 ?[id number] ? ?Referring provider: Jinny Sanders, MD ?[place] ?[place],   [id number] ? ?Chief Complaint  ?Patient presents with  ? Over Active Bladder  ?  1 year follow up  ? ?Urological history: ?1. OAB wet ?-Contributing factors of age, vaginal atrophy, cystocele, stroke, IBS, arthritis, hypertension, anxiety and lymphedema ?-PVR 11 mL ? ?2. Cystocele ?-s/p cystocele repair by GYN in early 1990's for symptomatic prolapse following benign hyst many years prior ? ?HPI: ?Karen Bowman is a 81 y.o. female who presents today for a yearly follow up.  ? ?She is having 1-7 daytime urinations, 3 or more nighttime urinations with a mild urge to urinate.  She leaks urine when she wakes too long to visit the restroom.  She has urinary leakage 1-2 times weekly.  She wears absorbent pads at night for security.  She does engage in toilet mapping at times. ? ?She is not bothered by the nocturia.  She is taking an OTC "bladder pill."  Se does not recall the name.   ? ?Patient denies any modifying or aggravating factors.  Patient denies any gross hematuria, dysuria or suprapubic/flank pain.  Patient denies any fevers, chills, nausea or vomiting.   ? ?PVR 11 mL ? ?PMH: ?Past Medical History:  ?Diagnosis Date  ? Acute heart failure ([place])   ? Anxiety   ? Arrhythmia   ? Arthritis   ? Atrial fibrillation ([place])   ? Heart murmur   ? Hyperlipemia   ? Hypertension   ? Stroke [facility])   ? ? ?Surgical History: ?Past Surgical History:  ?Procedure Laterality Date  ? ABDOMINAL HYSTERECTOMY    ? APPENDECTOMY    ? BLADDER SUSPENSION    ? ELECTROPHYSIOLOGIC STUDY N/A 10/18/2014  ? Procedure: CARDIOVERSION;  Surgeon: Thayer Headings, MD;  Location: [facility] ORS;  Service: Cardiovascular;  Laterality: N/A;  ? TEE WITHOUT CARDIOVERSION N/A 10/18/2014  ? Procedure: TRANSESOPHAGEAL ECHOCARDIOGRAM (TEE);  Surgeon: Thayer Headings, MD;  Location: [facility] ORS;  Service: Cardiovascular;   Laterality: N/A;  ? TONSILLECTOMY    ? TUMOR REMOVAL    ? right arm  ? ? ?Home Medications:  ?Allergies as of 07/31/2021   ? ?   Reactions  ? Ciprofloxacin   ? REACTION: dizzy  ? Ciprofloxacin Nausea And Vomiting  ? Hydrochlorothiazide Nausea And Vomiting  ? Hydrochlorothiazide W-triamterene   ? REACTION: nausea  ? Sulfa Antibiotics Nausea And Vomiting  ? Sulfonamide Derivatives   ? REACTION: nausea' \\T'$ \ vomiting  ? Valsartan   ? REACTION: angioedema - probable  ? Valsartan Nausea And Vomiting  ? ?  ? ?  ?Medication List  ?  ? ?  ? Accurate as of Jul 31, 2021  2:08 PM. If you have any questions, ask your nurse or doctor.  ?  ?  ? ?  ? ?amiodarone 200 MG tablet ?Commonly known as: PACERONE ?TAKE 1/2 TABLET BY MOUTH DAILY ?  ?amLODipine 2.5 MG tablet ?Commonly known as: NORVASC ?TAKE 1 TABLET BY MOUTH EVERY DAY ?  ?atorvastatin 40 MG tablet ?Commonly known as: LIPITOR ?TAKE 1 TABLET BY MOUTH DAILY AT 6 PM. ?  ?Cholecalciferol 25 MCG (1000 UT) capsule ?Take 2,000 Units by mouth daily. ?  ?CINNAMON PO ?Take 2 tablets by mouth daily. ?  ?CO-Q 10 Omega-3 Fish Oil Caps ?Take 1 capsule by mouth 2 (two) times daily. ?  ?CRANBERRY PO ?Take 1 tablet by mouth 2 (  two) times daily. ?  ?Eliquis 5 MG Tabs tablet ?Generic drug: apixaban ?TAKE 1 TABLET BY MOUTH TWICE A DAY. NEED LAB WORK FOR REFILL ?  ?ezetimibe 10 MG tablet ?Commonly known as: ZETIA ?TAKE 1 TABLET BY MOUTH EVERY DAY ?  ?lidocaine 5 % ?Commonly known as: LIDODERM ?Place 1 patch onto the skin daily. Remove & Discard patch within 12 hours or as directed by MD ?  ?LYSINE PO ?Take by mouth daily. ?  ?metoprolol tartrate 25 MG tablet ?Commonly known as: LOPRESSOR ?TAKE 0.5 TABLETS BY MOUTH 2 TIMES DAILY. ?  ?MISC NATURAL PRODUCTS PO ?Take 1 tablet by mouth daily. Multivitamin-Dr. Talmage Coin ?  ?mupirocin cream 2 % ?Commonly known as: Bactroban ?Apply 1 application topically 2 (two) times daily. ?  ?NON FORMULARY ?Cell Rebuild Takes 1 tablet BID. ?  ?NON FORMULARY ?Canaflex 1  tablet daily. ?  ?NON FORMULARY ?Epivery Takes 1 tablet bid. ?  ?NON FORMULARY ?Limbex take one tablet daily ?  ?NON FORMULARY ?Sanguenol nitric oxide one tablet daily ?  ?NON FORMULARY ?Reprieve one tablet daily ?  ?Premarin vaginal cream ?Generic drug: conjugated estrogens ?Apply 0.'5mg'$  (pea-sized amount)  just inside the vaginal introitus with a finger-tip on  Monday, Wednesday and Friday nights. ?  ?TURMERIC PO ?Take by mouth 2 (two) times daily. ?  ? ?  ? ? ?Allergies:  ?Allergies  ?Allergen Reactions  ? Ciprofloxacin   ?  REACTION: dizzy  ? Ciprofloxacin Nausea And Vomiting  ? Hydrochlorothiazide Nausea And Vomiting  ? Hydrochlorothiazide W-Triamterene   ?  REACTION: nausea  ? Sulfa Antibiotics Nausea And Vomiting  ? Sulfonamide Derivatives   ?  REACTION: nausea' \\T'$ \ vomiting  ? Valsartan   ?  REACTION: angioedema - probable  ? Valsartan Nausea And Vomiting  ? ? ?Family History: ?Family History  ?Problem Relation Age of Onset  ? Osteoporosis Mother   ? Heart disease Mother   ? Stroke Mother   ? Cancer Father   ? Diabetes Maternal Aunt   ? ? ?Social History:  reports that she has never smoked. She has never used smokeless tobacco. She reports that she does not drink alcohol and does not use drugs. ? ?ROS: ?For pertinent review of systems please refer to history of present illness ? ?Physical Exam: ?BP (!) 192/60   Pulse (!) 59   Ht '5\' 4"'$  (1.626 m)   Wt 115 lb (52.2 kg)   LMP  (LMP Unknown)   BMI 19.74 kg/m?   ?Constitutional:  Well nourished. Alert and oriented, No acute distress. ?HEENT: [place] AT, moist mucus membranes.  Trachea midline, no masses. ?Cardiovascular: No clubbing, cyanosis, or edema. ?Respiratory: Normal respiratory effort, no increased work of breathing. ?Neurologic: Grossly intact, no focal deficits, moving all 4 extremities. ?Psychiatric: Normal mood and affect.   ? ?Laboratory Data: ?Results for orders placed or performed in visit on 05/21/21  ?Comprehensive metabolic panel  ?Result Value Ref  Range  ? Sodium 138 135 - 145 mEq/L  ? Potassium 3.9 3.5 - 5.1 mEq/L  ? Chloride 103 96 - 112 mEq/L  ? CO2 31 19 - 32 mEq/L  ? Glucose, Bld 91 70 - 99 mg/dL  ? BUN 25 (H) 6 - 23 mg/dL  ? Creatinine, Ser 0.85 0.40 - 1.20 mg/dL  ? Total Bilirubin 0.6 0.2 - 1.2 mg/dL  ? Alkaline Phosphatase 71 39 - 117 U/L  ? AST 21 0 - 37 U/L  ? ALT 21 0 - 35 U/L  ? Total  Protein 7.5 6.0 - 8.3 g/dL  ? Albumin 4.2 3.5 - 5.2 g/dL  ? GFR 65.53 >60.00 mL/min  ? Calcium 9.7 8.4 - 10.5 mg/dL  ?Lipid panel  ?Result Value Ref Range  ? Cholesterol 156 0 - 200 mg/dL  ? Triglycerides 63.0 0.0 - 149.0 mg/dL  ? HDL 54.00 >39.00 mg/dL  ? VLDL 12.6 0.0 - 40.0 mg/dL  ? LDL Cholesterol 90 0 - 99 mg/dL  ? Total CHOL/HDL Ratio 3   ? NonHDL 102.41   ?I have reviewed the labs. ? ?Pertinent imaging: ?No recent urological imaging ? ?Assessment & Plan:   ? ?1. OAB wet ?-not bothersome ? ?2. Cystocele ?-No bothersome incontinence symptoms at this time ?-managing with incontinence pads ? ?3.  Vaginal atrophy ?-not bothersome at this time ? ?Return in about 1 year (around 08/01/2022) for PVR and OAB questionnaire. ? ?These notes generated with voice recognition software. I apologize for typographical errors. ? ?Khamani Daniely, PA-C ? ?[place] ?[address] [place], [place] [address] ?[phone] ? ?

## 2021-07-31 ENCOUNTER — Ambulatory Visit: Payer: Medicare Other | Admitting: Urology

## 2021-07-31 ENCOUNTER — Encounter: Payer: Self-pay | Admitting: Urology

## 2021-07-31 VITALS — BP 192/60 | HR 59 | Ht 64.0 in | Wt 115.0 lb

## 2021-07-31 DIAGNOSIS — N952 Postmenopausal atrophic vaginitis: Secondary | ICD-10-CM | POA: Diagnosis not present

## 2021-07-31 DIAGNOSIS — N8111 Cystocele, midline: Secondary | ICD-10-CM

## 2021-07-31 DIAGNOSIS — N3281 Overactive bladder: Secondary | ICD-10-CM

## 2021-11-08 ENCOUNTER — Ambulatory Visit: Payer: Medicare Other | Admitting: Dermatology

## 2021-11-19 ENCOUNTER — Ambulatory Visit: Payer: Medicare Other | Admitting: Dermatology

## 2021-11-19 DIAGNOSIS — C44310 Basal cell carcinoma of skin of unspecified parts of face: Secondary | ICD-10-CM

## 2021-11-19 DIAGNOSIS — D489 Neoplasm of uncertain behavior, unspecified: Secondary | ICD-10-CM

## 2021-11-19 DIAGNOSIS — C44319 Basal cell carcinoma of skin of other parts of face: Secondary | ICD-10-CM | POA: Diagnosis not present

## 2021-11-19 DIAGNOSIS — L814 Other melanin hyperpigmentation: Secondary | ICD-10-CM | POA: Diagnosis not present

## 2021-11-19 DIAGNOSIS — C4491 Basal cell carcinoma of skin, unspecified: Secondary | ICD-10-CM

## 2021-11-19 DIAGNOSIS — L578 Other skin changes due to chronic exposure to nonionizing radiation: Secondary | ICD-10-CM | POA: Diagnosis not present

## 2021-11-19 HISTORY — DX: Basal cell carcinoma of skin, unspecified: C44.91

## 2021-11-19 NOTE — Progress Notes (Signed)
New Patient Visit  Subjective  Karen Bowman is a 81 y.o. female who presents for the following: New Patient (Initial Visit) (Denies history or family history of skin cancer. Patient reports having a spot above left eyebrow that she would like checked. ). The patient has spots, moles and lesions to be evaluated, some may be new or changing and the patient has concerns that these could be cancer.  The following portions of the chart were reviewed this encounter and updated as appropriate:   Tobacco  Allergies  Meds  Problems  Med Hx  Surg Hx  Fam Hx     Review of Systems:  No other skin or systemic complaints except as noted in HPI or Assessment and Plan.  Objective  Well appearing patient in no apparent distress; mood and affect are within normal limits.  A focused examination was performed including left forehead above brow. Relevant physical exam findings are noted in the Assessment and Plan.  left forehead above brow 0.8 cm pearly papule       Assessment & Plan  Neoplasm of uncertain behavior left forehead above brow  Epidermal / dermal shaving  Lesion diameter (cm):  0.8 Informed consent: discussed and consent obtained   Patient was prepped and draped in usual sterile fashion: Area prepped with alcohol. Anesthesia: the lesion was anesthetized in a standard fashion   Anesthetic:  1% lidocaine w/ epinephrine 1-100,000 buffered w/ 8.4% NaHCO3 Instrument used: flexible razor blade   Hemostasis achieved with: pressure, aluminum chloride and electrodesiccation   Outcome: patient tolerated procedure well   Post-procedure details: wound care instructions given   Post-procedure details comment:  Ointment and small bandage applied.   Destruction of lesion Complexity: extensive   Destruction method: electrodesiccation and curettage   Informed consent: discussed and consent obtained   Timeout:  patient name, date of birth, surgical site, and procedure  verified Procedure prep:  Patient was prepped and draped in usual sterile fashion Prep type:  Isopropyl alcohol Anesthesia: the lesion was anesthetized in a standard fashion   Anesthetic:  1% lidocaine w/ epinephrine 1-100,000 buffered w/ 8.4% NaHCO3 Curettage performed in three different directions: Yes   Electrodesiccation performed over the curetted area: Yes   Lesion length (cm):  0.8 Lesion width (cm):  0.8 Margin per side (cm):  0.2 Final wound size (cm):  1.2 Hemostasis achieved with:  pressure, aluminum chloride and electrodesiccation Outcome: patient tolerated procedure well with no complications   Post-procedure details: sterile dressing applied and wound care instructions given   Dressing type: bandage and petrolatum    Specimen 1 - Surgical pathology Differential Diagnosis: r/o BCC  Check Margins: No  R/o bcc  Actinic Damage - chronic, secondary to cumulative UV radiation exposure/sun exposure over time - diffuse scaly erythematous macules with underlying dyspigmentation - Recommend daily broad spectrum sunscreen SPF 30+ to sun-exposed areas, reapply every 2 hours as needed.  - Recommend staying in the shade or wearing long sleeves, sun glasses (UVA+UVB protection) and wide brim hats (4-inch brim around the entire circumference of the hat). - Call for new or changing lesions.  Lentigines - Scattered tan macules - Due to sun exposure - Benign-appering, observe - Recommend daily broad spectrum sunscreen SPF 30+ to sun-exposed areas, reapply every 2 hours as needed. - Call for any changes  Return in about 6 months (around 05/22/2022) for TBSE.  Garry Heater, CMA, am acting as scribe for Sarina Ser, MD. Documentation: I have reviewed the above documentation  for accuracy and completeness, and I agree with the above.  Sarina Ser, MD

## 2021-11-19 NOTE — Patient Instructions (Addendum)
Electrodesiccation and Curettage ("Scrape and Burn") Wound Care Instructions  Leave the original bandage on for 24 hours if possible.  If the bandage becomes soaked or soiled before that time, it is OK to remove it and examine the wound.  A small amount of post-operative bleeding is normal.  If excessive bleeding occurs, remove the bandage, place gauze over the site and apply continuous pressure (no peeking) over the area for 30 minutes. If this does not work, please call our clinic as soon as possible or page your doctor if it is after hours.   Once a day, cleanse the wound with soap and water. It is fine to shower. If a thick crust develops you may use a Q-tip dipped into dilute hydrogen peroxide (mix 1:1 with water) to dissolve it.  Hydrogen peroxide can slow the healing process, so use it only as needed.    After washing, apply petroleum jelly (Vaseline) or an antibiotic ointment if your doctor prescribed one for you, followed by a bandage.    For best healing, the wound should be covered with a layer of ointment at all times. If you are not able to keep the area covered with a bandage to hold the ointment in place, this may mean re-applying the ointment several times a day.  Continue this wound care until the wound has healed and is no longer open. It may take several weeks for the wound to heal and close.  Itching and mild discomfort is normal during the healing process.  If you have any discomfort, you can take Tylenol (acetaminophen) or ibuprofen as directed on the bottle. (Please do not take these if you have an allergy to them or cannot take them for another reason).  Some redness, tenderness and white or yellow material in the wound is normal healing.  If the area becomes very sore and red, or develops a thick yellow-green material (pus), it may be infected; please notify us.    Wound healing continues for up to one year following surgery. It is not unusual to experience pain in the scar  from time to time during the interval.  If the pain becomes severe or the scar thickens, you should notify the office.    A slight amount of redness in a scar is expected for the first six months.  After six months, the redness will fade and the scar will soften and fade.  The color difference becomes less noticeable with time.  If there are any problems, return for a post-op surgery check at your earliest convenience.  To improve the appearance of the scar, you can use silicone scar gel, cream, or sheets (such as Mederma or Serica) every night for up to one year. These are available over the counter (without a prescription).  Please call our office at [phone] for any questions or concerns.  Biopsy Wound Care Instructions  Leave the original bandage on for 24 hours if possible.  If the bandage becomes soaked or soiled before that time, it is OK to remove it and examine the wound.  A small amount of post-operative bleeding is normal.  If excessive bleeding occurs, remove the bandage, place gauze over the site and apply continuous pressure (no peeking) over the area for 30 minutes. If this does not work, please call our clinic as soon as possible or page your doctor if it is after hours.   Once a day, cleanse the wound with soap and water. It is fine to shower. If   a thick crust develops you may use a Q-tip dipped into dilute hydrogen peroxide (mix 1:1 with water) to dissolve it.  Hydrogen peroxide can slow the healing process, so use it only as needed.    After washing, apply petroleum jelly (Vaseline) or an antibiotic ointment if your doctor prescribed one for you, followed by a bandage.    For best healing, the wound should be covered with a layer of ointment at all times. If you are not able to keep the area covered with a bandage to hold the ointment in place, this may mean re-applying the ointment several times a day.  Continue this wound care until the wound has healed and is no longer  open.   Itching and mild discomfort is normal during the healing process. However, if you develop pain or severe itching, please call our office.   If you have any discomfort, you can take Tylenol (acetaminophen) or ibuprofen as directed on the bottle. (Please do not take these if you have an allergy to them or cannot take them for another reason).  Some redness, tenderness and white or yellow material in the wound is normal healing.  If the area becomes very sore and red, or develops a thick yellow-green material (pus), it may be infected; please notify us.    If you have stitches, return to clinic as directed to have the stitches removed. You will continue wound care for 2-3 days after the stitches are removed.   Wound healing continues for up to one year following surgery. It is not unusual to experience pain in the scar from time to time during the interval.  If the pain becomes severe or the scar thickens, you should notify the office.    A slight amount of redness in a scar is expected for the first six months.  After six months, the redness will fade and the scar will soften and fade.  The color difference becomes less noticeable with time.  If there are any problems, return for a post-op surgery check at your earliest convenience.  To improve the appearance of the scar, you can use silicone scar gel, cream, or sheets (such as Mederma or Serica) every night for up to one year. These are available over the counter (without a prescription).  Please call our office at [phone] for any questions or concerns.       Due to recent changes in healthcare laws, you may see results of your pathology and/or laboratory studies on MyChart before the doctors have had a chance to review them. We understand that in some cases there may be results that are confusing or concerning to you. Please understand that not all results are received at the same time and often the doctors may need to interpret  multiple results in order to provide you with the best plan of care or course of treatment. Therefore, we ask that you please give us 2 business days to thoroughly review all your results before contacting the office for clarification. Should we see a critical lab result, you will be contacted sooner.   If You Need Anything After Your Visit  If you have any questions or concerns for your doctor, please call our main line at [phone] and press option 4 to reach your doctor's medical assistant. If no one answers, please leave a voicemail as directed and we will return your call as soon as possible. Messages left after 4 pm will be answered the following business day.     You may also send us a message via MyChart. We typically respond to MyChart messages within 1-2 business days.  For prescription refills, please ask your pharmacy to contact our office. Our fax number is [phone].  If you have an urgent issue when the clinic is closed that cannot wait until the next business day, you can page your doctor at the number below.    Please note that while we do our best to be available for urgent issues outside of office hours, we are not available 24/7.   If you have an urgent issue and are unable to reach us, you may choose to seek medical care at your doctor's office, retail clinic, urgent care center, or emergency room.  If you have a medical emergency, please immediately call 911 or go to the emergency department.  Pager Numbers  - Dr. Kowalski: [phone]  - Dr. Moye: [phone]  - Dr. Stewart: [phone]  In the event of inclement weather, please call our main line at [phone] for an update on the status of any delays or closures.  Dermatology Medication Tips: Please keep the boxes that topical medications come in in order to help keep track of the instructions about where and how to use these. Pharmacies typically print the medication instructions only on the boxes and  not directly on the medication tubes.   If your medication is too expensive, please contact our office at [phone] option 4 or send us a message through MyChart.   We are unable to tell what your co-pay for medications will be in advance as this is different depending on your insurance coverage. However, we may be able to find a substitute medication at lower cost or fill out paperwork to get insurance to cover a needed medication.   If a prior authorization is required to get your medication covered by your insurance company, please allow us 1-2 business days to complete this process.  Drug prices often vary depending on where the prescription is filled and some pharmacies may offer cheaper prices.  The website www.goodrx.com contains coupons for medications through different pharmacies. The prices here do not account for what the cost may be with help from insurance (it may be cheaper with your insurance), but the website can give you the price if you did not use any insurance.  - You can print the associated coupon and take it with your prescription to the pharmacy.  - You may also stop by our office during regular business hours and pick up a GoodRx coupon card.  - If you need your prescription sent electronically to a different pharmacy, notify our office through [facility] MyChart or by phone at [phone] option 4.     Si Usted Necesita Algo Despus de Su Visita  Tambin puede enviarnos un mensaje a travs de MyChart. Por lo general respondemos a los mensajes de MyChart en el transcurso de 1 a 2 das hbiles.  Para renovar recetas, por favor pida a su farmacia que se ponga en contacto con nuestra oficina. Nuestro nmero de fax es el [phone].  Si tiene un asunto urgente cuando la clnica est cerrada y que no puede esperar hasta el siguiente da hbil, puede llamar/localizar a su doctor(a) al nmero que aparece a continuacin.   Por favor, tenga en cuenta que aunque  hacemos todo lo posible para estar disponibles para asuntos urgentes fuera del horario de oficina, no estamos disponibles las 24 horas del da, los 7 das de la semana.     Si tiene un problema urgente y no puede comunicarse con nosotros, puede optar por buscar atencin mdica  en el consultorio de su doctor(a), en una clnica privada, en un centro de atencin urgente o en una sala de emergencias.  Si tiene una emergencia mdica, por favor llame inmediatamente al 911 o vaya a la sala de emergencias.  Nmeros de bper  - Dr. Kowalski: [phone]  - Dra. Moye: [phone]  - Dra. Stewart: [phone]  En caso de inclemencias del tiempo, por favor llame a nuestra lnea principal al [phone] para una actualizacin sobre el estado de cualquier retraso o cierre.  Consejos para la medicacin en dermatologa: Por favor, guarde las cajas en las que vienen los medicamentos de uso tpico para ayudarle a seguir las instrucciones sobre dnde y cmo usarlos. Las farmacias generalmente imprimen las instrucciones del medicamento slo en las cajas y no directamente en los tubos del medicamento.   Si su medicamento es muy caro, por favor, pngase en contacto con nuestra oficina llamando al [phone] y presione la opcin 4 o envenos un mensaje a travs de MyChart.   No podemos decirle cul ser su copago por los medicamentos por adelantado ya que esto es diferente dependiendo de la cobertura de su seguro. Sin embargo, es posible que podamos encontrar un medicamento sustituto a menor costo o llenar un formulario para que el seguro cubra el medicamento que se considera necesario.   Si se requiere una autorizacin previa para que su compaa de seguros cubra su medicamento, por favor permtanos de 1 a 2 das hbiles para completar este proceso.  Los precios de los medicamentos varan con frecuencia dependiendo del lugar de dnde se surte la receta y alguna farmacias pueden ofrecer precios ms  baratos.  El sitio web www.goodrx.com tiene cupones para medicamentos de diferentes farmacias. Los precios aqu no tienen en cuenta lo que podra costar con la ayuda del seguro (puede ser ms barato con su seguro), pero el sitio web puede darle el precio si no utiliz ningn seguro.  - Puede imprimir el cupn correspondiente y llevarlo con su receta a la farmacia.  - Tambin puede pasar por nuestra oficina durante el horario de atencin regular y recoger una tarjeta de cupones de GoodRx.  - Si necesita que su receta se enve electrnicamente a una farmacia diferente, informe a nuestra oficina a travs de MyChart de [facility] o por telfono llamando al [phone] y presione la opcin 4.  

## 2021-11-21 ENCOUNTER — Telehealth: Payer: Self-pay

## 2021-11-21 NOTE — Telephone Encounter (Signed)
Patient informed of pathology results 

## 2021-11-21 NOTE — Telephone Encounter (Signed)
-----   Message from Ralene Bathe, MD sent at 11/20/2021  5:50 PM EDT ----- Diagnosis Skin , left forehead above brow BASAL CELL CARCINOMA, NODULAR PATTERN, BASE INVOLVED  Cancer - BCC As suspected Already treated Recheck next visit

## 2021-11-23 ENCOUNTER — Encounter: Payer: Self-pay | Admitting: Dermatology

## 2021-11-24 ENCOUNTER — Other Ambulatory Visit: Payer: Self-pay | Admitting: Cardiovascular Disease

## 2021-11-28 ENCOUNTER — Other Ambulatory Visit: Payer: Self-pay | Admitting: Cardiovascular Disease

## 2021-12-21 ENCOUNTER — Encounter: Payer: Self-pay | Admitting: Cardiology

## 2021-12-21 ENCOUNTER — Ambulatory Visit: Payer: Medicare Other | Attending: Cardiology | Admitting: Cardiology

## 2021-12-21 VITALS — BP 140/86 | HR 131 | Ht 65.0 in | Wt 116.0 lb

## 2021-12-21 DIAGNOSIS — I63312 Cerebral infarction due to thrombosis of left middle cerebral artery: Secondary | ICD-10-CM | POA: Diagnosis not present

## 2021-12-21 DIAGNOSIS — I10 Essential (primary) hypertension: Secondary | ICD-10-CM | POA: Diagnosis not present

## 2021-12-21 DIAGNOSIS — I480 Paroxysmal atrial fibrillation: Secondary | ICD-10-CM | POA: Diagnosis not present

## 2021-12-21 DIAGNOSIS — E782 Mixed hyperlipidemia: Secondary | ICD-10-CM | POA: Diagnosis not present

## 2021-12-21 DIAGNOSIS — I4892 Unspecified atrial flutter: Secondary | ICD-10-CM

## 2021-12-21 NOTE — Patient Instructions (Signed)
Medication Instructions:  Your physician has recommended you make the following change in your medication INCREASE: Metoprolol to 12.'5mg'$  in the morning and '25mg'$  in the evening.  *If you need a refill on your cardiac medications before your next appointment, please call your pharmacy*   Lab Work: NONE ordered at this time of appointment  If you have labs (blood work) drawn today and your tests are completely normal, you will receive your results only by: [place] (if you have MyChart) OR A paper copy in the mail If you have any lab test that is abnormal or we need to change your treatment, we will call you to review the results.   Testing/Procedures: NONE ordered at this time of appointment     Follow-Up: At [facility], you and your health needs are our priority.  As part of our continuing mission to provide you with exceptional heart care, we have created designated Provider Care Teams.  These Care Teams include your primary Cardiologist (physician) and Advanced Practice Providers (APPs -  Physician Assistants and Nurse Practitioners) who all work together to provide you with the care you need, when you need it.  We recommend signing up for the patient portal called "MyChart".  Sign up information is provided on this After Visit Summary.  MyChart is used to connect with patients for Virtual Visits (Telemedicine).  Patients are able to view lab/test results, encounter notes, upcoming appointments, etc.  Non-urgent messages can be sent to your provider as well.   To learn more about what you can do with MyChart, go to [email or web address].    Other Instructions Wednesday 12/26/2021 at 2:30. Check in at the [facility] registrations desk.

## 2021-12-21 NOTE — Progress Notes (Signed)
Cardiology Clinic Note   Patient Name: Karen Bowman Date of Encounter: 12/21/2021  Primary Care Provider:  Jinny Sanders, MD Primary Cardiologist:  Ida Rogue, MD  Patient Profile    81 year old female with a past medical history of paroxysmal atrial fibrillation, heart murmur, hypertension, hyperlipidemia, previous stroke, arthritis, and anxiety, who returns for follow up on her paroxysmal atrial fibrillation.   Past Medical History    Past Medical History:  Diagnosis Date   Acute heart failure (HCC)    Anxiety    Arrhythmia    Arthritis    Atrial fibrillation ([place])    Basal cell carcinoma 11/19/2021   left forehead above brow - ED&C   Heart murmur    Hyperlipemia    Hypertension    Stroke [facility])    Past Surgical History:  Procedure Laterality Date   ABDOMINAL HYSTERECTOMY     APPENDECTOMY     BLADDER SUSPENSION     ELECTROPHYSIOLOGIC STUDY N/A 10/18/2014   Procedure: CARDIOVERSION;  Surgeon: Thayer Headings, MD;  Location: [facility] ORS;  Service: Cardiovascular;  Laterality: N/A;   TEE WITHOUT CARDIOVERSION N/A 10/18/2014   Procedure: TRANSESOPHAGEAL ECHOCARDIOGRAM (TEE);  Surgeon: Thayer Headings, MD;  Location: [facility] ORS;  Service: Cardiovascular;  Laterality: N/A;   TONSILLECTOMY     TUMOR REMOVAL     right arm    Allergies  Allergies  Allergen Reactions   Ciprofloxacin     REACTION: dizzy   Ciprofloxacin Nausea And Vomiting   Hydrochlorothiazide Nausea And Vomiting   Hydrochlorothiazide W-Triamterene     REACTION: nausea   Sulfa Antibiotics Nausea And Vomiting   Sulfonamide Derivatives     REACTION: nausea' \\T'$ \ vomiting   Valsartan     REACTION: angioedema - probable   Valsartan Nausea And Vomiting    History of Present Illness    81 year old female with a past medical history of paroxysmal atrial fibrillation, heart murmur, hypertension, hyperlipidemia, previous stroke, arthritis, and anxiety.  She was hospitalized July 2016 for atrial  fibrillation and was discharged on amiodarone, anticoagulation, digoxin, and metoprolol. Echocardiogram revealed LVEF 35-40%, moderately dilated right and left atrium, mild MR, and moderate TR, Hospitalized again in 05/2015 for stroke with residual right sided weakness. Echocardiogram repeated and revealed LVEF 55-60%, NWMA, mitral valve with moderately calcified annulus, and mild TR. CT angio of the neck in 2017 revealed moderate to severe bilateral proximal PCA stenoses and mild right cavernous and supraclinoid ICA stenosis, no cervical carotid or vertebral artery stenosis.  She was last seen by Dr Rockey Situ 12/20/2020 just after losing her husband. She was stable from the cardiac standpoint but was having a difficult time adjusting to the change at home with altered sleep.  She was just evaluated by dermatology and had a 0.7 cm lesion removed from above her left eyebrow. The area was sent for pathology which returned a positive result of basal cell carcinoma. She was treated and will continue with routine follow ups.  She returns to clinic today stating that she is tired and has been off and on for several days. She said that her heart rate has been erratic for years.  She has been having issues with diarrhea and abnormal bowel movements lately. She continues to live with her son. She denies any chest pain, shortness of breath, or palpitations. Denies any hospitalizations or visits to the emergency department. Home Medications    Current Outpatient Medications  Medication Sig Dispense Refill   amiodarone (PACERONE) 200  MG tablet TAKE 1/2 TABLET BY MOUTH EVERY DAY 45 tablet 0   amLODipine (NORVASC) 2.5 MG tablet TAKE 1 TABLET BY MOUTH EVERY DAY 90 tablet 2   apixaban (ELIQUIS) 5 MG TABS tablet TAKE 1 TABLET BY MOUTH TWICE A DAY. NEED LAB WORK FOR REFILL 60 tablet 11   atorvastatin (LIPITOR) 40 MG tablet TAKE 1 TABLET BY MOUTH DAILY AT 6 PM. 90 tablet 2   Cholecalciferol 1000 UNITS capsule Take 2,000  Units by mouth daily.      CINNAMON PO Take 2 tablets by mouth daily.      Coenzyme Q10-Fish Oil-Vit E (CO-Q 10 OMEGA-3 FISH OIL) CAPS Take 1 capsule by mouth 2 (two) times daily.     conjugated estrogens (PREMARIN) vaginal cream Apply 0.'5mg'$  (pea-sized amount)  just inside the vaginal introitus with a finger-tip on  Monday, Wednesday and Friday nights. 30 g 12   CRANBERRY PO Take 1 tablet by mouth 2 (two) times daily.      ezetimibe (ZETIA) 10 MG tablet TAKE 1 TABLET BY MOUTH EVERY DAY 90 tablet 2   lidocaine (LIDODERM) 5 % Place 1 patch onto the skin daily. Remove & Discard patch within 12 hours or as directed by MD 10 patch 0   LYSINE PO Take by mouth daily.     metoprolol tartrate (LOPRESSOR) 25 MG tablet TAKE 1/2 TABLET BY MOUTH TWICE A DAY 90 tablet 0   MISC NATURAL PRODUCTS PO Take 1 tablet by mouth daily. Multivitamin-Dr. Talmage Coin     mupirocin cream (BACTROBAN) 2 % Apply 1 application topically 2 (two) times daily. 15 g 0   NON FORMULARY Cell Rebuild Takes 1 tablet BID.     NON FORMULARY Canaflex 1 tablet daily.     NON FORMULARY Epivery Takes 1 tablet bid.     NON FORMULARY Limbex take one tablet daily     NON FORMULARY Sanguenol nitric oxide one tablet daily     NON FORMULARY Reprieve one tablet daily     TURMERIC PO Take by mouth 2 (two) times daily.     No current facility-administered medications for this visit.     Family History    Family History  Problem Relation Age of Onset   Osteoporosis Mother    Heart disease Mother    Stroke Mother    Cancer Father    Diabetes Maternal Aunt    She indicated that the status of her mother is unknown. She indicated that her father is deceased. She indicated that her brother is alive. She indicated that her maternal aunt is deceased.  Social History    Social History   Socioeconomic History   Marital status: Married    Spouse name: Not on file   Number of children: 2   Years of education: Not on file   Highest education  level: Not on file  Occupational History   Occupation: retired Network engineer  Tobacco Use   Smoking status: Never   Smokeless tobacco: Never  Vaping Use   Vaping Use: Never used  Substance and Sexual Activity   Alcohol use: No   Drug use: No   Sexual activity: Not Currently  Other Topics Concern   Not on file  Social History Narrative   ** Merged History Encounter **       Exercise:walking 2-3 times a week   End of life issues: Has living will.. Husband is HCPOA. Full code (reviewed 2014)   Social Determinants of Radio broadcast assistant  Strain: Low Risk  (02/19/2021)   Overall Financial Resource Strain (CARDIA)    Difficulty of Paying Living Expenses: Not very hard  Food Insecurity: Not on file  Transportation Needs: Not on file  Physical Activity: Not on file  Stress: Not on file  Social Connections: Not on file  Intimate Partner Violence: Not on file     Review of Systems    General:  No chills, fever, night sweats or weight changes. Endorses fatigue. Cardiovascular:  No chest pain, dyspnea on exertion, edema, orthopnea, palpitations, paroxysmal nocturnal dyspnea. Dermatological: No rash, lesions/masses Respiratory: No cough, dyspnea Urologic: No hematuria, dysuria Abdominal:   No nausea, vomiting, bright red blood per rectum, melena, or hematemesis, endorses diarrhea Neurologic:  No visual changes, wkns, changes in mental status. All other systems reviewed and are otherwise negative except as noted above.     Physical Exam    VS:  BP (!) 140/86 (BP Location: Left Arm, Patient Position: Sitting, Cuff Size: Normal)   Pulse (!) 131   Ht '5\' 5"'$  (1.651 m)   Wt 116 lb (52.6 kg)   LMP  (LMP Unknown)   SpO2 97%   BMI 19.30 kg/m  , BMI Body mass index is 19.3 kg/m.     GEN: Well nourished, well developed, in no acute distress. HEENT: normal. Neck: Supple, no JVD, carotid bruits, or masses. Cardiac: irregularly irregular, no murmurs, rubs, or gallops. No clubbing,  cyanosis, edema.  Compression stockings on bilateral lower extremities.Radials/DP/PT 2+ and equal bilaterally.  Respiratory:  Respirations regular and unlabored, clear to auscultation bilaterally. GI: Soft, nontender, nondistended, BS + x 4. MS: no deformity or atrophy. Skin: warm and dry, no rash. Neuro:  Strength and sensation are intact. Psych: Normal affect.  Accessory Clinical Findings    ECG personally reviewed by me today- atrial flutter with variable AV block rate 131, lvh, nonspecific t waves - No acute changes  Lab Results  Component Value Date   WBC 5.0 07/04/2015   HGB 13.4 07/04/2015   HCT 41.2 07/04/2015   MCV 95.4 07/04/2015   PLT 196 07/04/2015   Lab Results  Component Value Date   CREATININE 0.85 05/21/2021   BUN 25 (H) 05/21/2021   NA 138 05/21/2021   K 3.9 05/21/2021   CL 103 05/21/2021   CO2 31 05/21/2021   Lab Results  Component Value Date   ALT 21 05/21/2021   AST 21 05/21/2021   ALKPHOS 71 05/21/2021   BILITOT 0.6 05/21/2021   Lab Results  Component Value Date   CHOL 156 05/21/2021   HDL 54.00 05/21/2021   LDLCALC 90 05/21/2021   LDLDIRECT 206.0 03/08/2013   TRIG 63.0 05/21/2021   CHOLHDL 3 05/21/2021    Lab Results  Component Value Date   HGBA1C 5.7 (H) 06/28/2015    Assessment & Plan   1.  Paroxysmal atrial fibrillation and now atrial flutter  noted on EKG. Patient has remained on eliquis stating that she has not missed any doses of her medication. Encouraged to have labs checked or be evaluated in the emergency department. Patient stated she would rather just call her PCP on Monday and have them draw her labs at that time. Advised to increase metoprolol to 25 mg bid but patient will start at 12.5 mg in the AM and 25 mg in the pm starting tonight. She has been continued on amiodarone, eliquis. For any other symptoms patient should report to the emergency department.   2.Hypertension with blood pressure today  of 140/86. Encouraged to  continue to monitor blood pressure at home. Continued on metoprolol and increased as above and amlodipine.  3. Hyperlipidemia with LDL of 90, continued on atorvastatin, zetia, CoQ10. Followed by PCP.  4. Cerebrovascular accident due to thrombosis of left middle cerebral artery. On eliquis in place of asa. Continued on statin therapy.   5.Disposition patient to return to see MD/APP for repeat EKG early next week to re-evaluate heart-rate.  Jahziel Sinn, NP 12/21/2021, 9:20 PM

## 2021-12-26 ENCOUNTER — Ambulatory Visit
Admission: RE | Admit: 2021-12-26 | Discharge: 2021-12-26 | Disposition: A | Discharge status: Home / Self Care | Payer: Medicare Other | Source: Ambulatory Visit | Attending: Cardiovascular Disease | Admitting: Cardiovascular Disease

## 2021-12-26 DIAGNOSIS — I480 Paroxysmal atrial fibrillation: Secondary | ICD-10-CM | POA: Diagnosis not present

## 2022-02-26 ENCOUNTER — Other Ambulatory Visit: Payer: Self-pay | Admitting: Cardiovascular Disease

## 2022-02-27 ENCOUNTER — Other Ambulatory Visit: Payer: Self-pay | Admitting: Cardiovascular Disease

## 2022-04-01 ENCOUNTER — Other Ambulatory Visit: Payer: Self-pay | Admitting: Cardiovascular Disease

## 2022-05-03 ENCOUNTER — Other Ambulatory Visit: Payer: Self-pay | Admitting: Cardiology

## 2022-05-03 ENCOUNTER — Telehealth: Payer: Self-pay | Admitting: Cardiology

## 2022-05-03 ENCOUNTER — Other Ambulatory Visit: Payer: Self-pay | Admitting: Cardiovascular Disease

## 2022-05-03 DIAGNOSIS — E782 Mixed hyperlipidemia: Secondary | ICD-10-CM

## 2022-05-03 MED ORDER — METOPROLOL TARTRATE 25 MG PO TABS: 25.0000 mg | ORAL_TABLET | Freq: Two times a day (BID) | ORAL | 1 refills | Status: DC

## 2022-05-03 NOTE — Telephone Encounter (Signed)
    Patient called this evening requesting a refill of her Metoprolol, which she has completely run out of. Will send a new Rx to her pharmacy with dosing as listed in her last office visit note from "12.5 mg in the AM and 25 mg in the pm." Since this note also mentions increasing to '25mg'$  BID, routing note to office for dose clarification.  Lily Kocher PA-C

## 2022-05-06 NOTE — Telephone Encounter (Signed)
Left message to call back  

## 2022-05-06 NOTE — Telephone Encounter (Signed)
Please send in metoprolol tartrate 25 mg bid dosing into the pharmacy of choice

## 2022-05-09 ENCOUNTER — Telehealth: Payer: Self-pay | Admitting: Cardiovascular Disease

## 2022-05-09 NOTE — Telephone Encounter (Signed)
Patient returned RN Alisha's call. Patient disconnected call while on hold to try and reach the nurse. Please advise.

## 2022-05-09 NOTE — Telephone Encounter (Signed)
Left message to call back  

## 2022-05-09 NOTE — Telephone Encounter (Signed)
Spoke with pt and informed her Karen Bowman recommended increasing metoprolol to 25 mg BID. Pt stated she was originally on 12.5 mg BID and feels the double dose would be too much as she report she is already tired.  Will forward to NP for recommendations

## 2022-05-10 MED ORDER — METOPROLOL TARTRATE 25 MG PO TABS: ORAL_TABLET | ORAL | 1 refills | Status: DC

## 2022-05-10 NOTE — Telephone Encounter (Signed)
Pt made aware of NP's recommendations and stated she will continue with Metoprolol 12.5 mg in the morning and 25 mg in the afternoon.  Chart updated to reflect change.

## 2022-05-10 NOTE — Telephone Encounter (Signed)
At her last appointment she was going to continue her 12.5 mg in the am and increase the evening dose to 69m, if she would rather keep taking the 12.5 mg twice daily, that is ok, but she may continue to have some bursts of "erratic heart beats" as she calls them.

## 2022-05-13 NOTE — Telephone Encounter (Signed)
Please see updated encounter  

## 2022-05-21 ENCOUNTER — Ambulatory Visit (INDEPENDENT_AMBULATORY_CARE_PROVIDER_SITE_OTHER): Payer: Medicare Other

## 2022-05-21 ENCOUNTER — Other Ambulatory Visit (INDEPENDENT_AMBULATORY_CARE_PROVIDER_SITE_OTHER): Payer: Medicare Other

## 2022-05-21 VITALS — Ht 65.0 in | Wt 116.0 lb

## 2022-05-21 DIAGNOSIS — E782 Mixed hyperlipidemia: Secondary | ICD-10-CM | POA: Diagnosis not present

## 2022-05-21 DIAGNOSIS — Z0000 Encounter for general adult medical examination without abnormal findings: Secondary | ICD-10-CM | POA: Diagnosis not present

## 2022-05-21 LAB — COMPREHENSIVE METABOLIC PANEL
ALT: 72 U/L — ABNORMAL HIGH (ref 0–35)
AST: 48 U/L — ABNORMAL HIGH (ref 0–37)
Albumin: 4 g/dL (ref 3.5–5.2)
Alkaline Phosphatase: 101 U/L (ref 39–117)
BUN: 24 mg/dL — ABNORMAL HIGH (ref 6–23)
CO2: 24 mEq/L (ref 19–32)
Calcium: 9.5 mg/dL (ref 8.4–10.5)
Chloride: 105 mEq/L (ref 96–112)
Creatinine, Ser: 1.02 mg/dL (ref 0.40–1.20)
GFR: 52.28 mL/min — ABNORMAL LOW (ref >60.00)
Glucose, Bld: 99 mg/dL (ref 70–99)
Potassium: 3.8 mEq/L (ref 3.5–5.1)
Sodium: 139 mEq/L (ref 135–145)
Total Bilirubin: 1.2 mg/dL (ref 0.2–1.2)
Total Protein: 6.8 g/dL (ref 6.0–8.3)

## 2022-05-21 LAB — LIPID PANEL
Cholesterol: 125 mg/dL (ref 0–200)
HDL: 46.3 mg/dL
LDL Cholesterol: 63 mg/dL (ref 0–99)
NonHDL: 79.12
Total CHOL/HDL Ratio: 3
Triglycerides: 80 mg/dL (ref 0.0–149.0)
VLDL: 16 mg/dL (ref 0.0–40.0)

## 2022-05-21 NOTE — Progress Notes (Signed)
No critical labs need to be addressed urgently. We will discuss labs in detail at upcoming office visit.   

## 2022-05-21 NOTE — Telephone Encounter (Signed)
-----   Message from Velna Hatchet, RT sent at 05/06/2022 10:54 AM EST ----- Regarding: Tue 2/20 lab Patient is scheduled for cpx, please order future labs.  Thanks, Anda Kraft

## 2022-05-21 NOTE — Patient Instructions (Signed)
Karen Bowman , Thank you for taking time to come for your Medicare Wellness Visit. I appreciate your ongoing commitment to your health goals. Please review the following plan we discussed and let me know if I can assist you in the future.   These are the goals we discussed:  Goals      Follow up with Primary Care Provider     Starting 07/08/2017, I will continue to take medications as prescribed and to keep appointments with PCP as scheduled.     Pharmacy Care Plan     CARE PLAN ENTRY  Current Barriers:  Chronic Disease Management support, education, and care coordination needs related to Hypertension and Hyperlipidemia   Hypertension Pharmacist Clinical Goal(s): Over the next 6 months, patient will work with PharmD and providers to achieve BP goal <140/90 mmHg Current regimen:  Amlodipine 2.5 mg - 1 tablet daily Metoprolol tartrate 25 mg - 1/2 tablet BID Interventions: Reviewed adherence - patient takes medications as prescribed Recommend continuing current medications, schedule AWV Patient self care activities - Over the next 6 months, patient will: Continue medications as prescribed  Hyperlipidemia Pharmacist Clinical Goal(s): Over the next 6 months, patient will work with PharmD and providers to achieve LDL goal < 70 Current regimen:  Atorvastatin 40 mg - 1 tablet daily Ezetimibe 10 mg - 1 tablet daily Interventions: Reviewed adherence - patient takes medications as prescribed Recommend annual lipid panel  Patient self care activities - Over the next 30 days, patient will: Continue medications as prescribed  Please see past updates related to this goal by clicking on the "Past Updates" button in the selected goal      safety     Starting 05/31/16, I will continue to use cane or walker as needed to reduce risk of falls.         This is a list of the screening recommended for you and due dates:  Health Maintenance  Topic Date Due   Zoster (Shingles) Vaccine (1 of 2)  Never done   Pneumonia Vaccine (1 of 1 - PCV) Never done   DTaP/Tdap/Td vaccine (2 - Tdap) 07/14/2018   COVID-19 Vaccine (3 - Moderna risk series) 12/19/2019   Flu Shot  05/31/2025*   DEXA scan (bone density measurement)  05/31/2025*   Medicare Annual Wellness Visit  05/22/2023   Hepatitis C Screening: USPSTF Recommendation to screen - Ages 18-79 yo.  Completed   HPV Vaccine  Aged Out   Colon Cancer Screening  Discontinued  *Topic was postponed. The date shown is not the original due date.    Advanced directives: Advance directive discussed with you today. Even though you declined this today, please call our office should you change your mind, and we can give you the proper paperwork for you to fill out.   Conditions/risks identified: Aim for 30 minutes of exercise or brisk walking, 6-8 glasses of water, and 5 servings of fruits and vegetables each day.   Next appointment: Follow up in one year for your annual wellness visit 05/27/2023 @ 1pm via telephone   Preventive Care 65 Years and Older, Female Preventive care refers to lifestyle choices and visits with your health care provider that can promote health and wellness. What does preventive care include? A yearly physical exam. This is also called an annual well check. Dental exams once or twice a year. Routine eye exams. Ask your health care provider how often you should have your eyes checked. Personal lifestyle choices, including: Daily care of  your teeth and gums. Regular physical activity. Eating a healthy diet. Avoiding tobacco and drug use. Limiting alcohol use. Practicing safe sex. Taking low-dose aspirin every day. Taking vitamin and mineral supplements as recommended by your health care provider. What happens during an annual well check? The services and screenings done by your health care provider during your annual well check will depend on your age, overall health, lifestyle risk factors, and family history of  disease. Counseling  Your health care provider may ask you questions about your: Alcohol use. Tobacco use. Drug use. Emotional well-being. Home and relationship well-being. Sexual activity. Eating habits. History of falls. Memory and ability to understand (cognition). Work and work Statistician. Reproductive health. Screening  You may have the following tests or measurements: Height, weight, and BMI. Blood pressure. Lipid and cholesterol levels. These may be checked every 5 years, or more frequently if you are over 81 years old. Skin check. Lung cancer screening. You may have this screening every year starting at age 32 if you have a 30-pack-year history of smoking and currently smoke or have quit within the past 15 years. Fecal occult blood test (FOBT) of the stool. You may have this test every year starting at age 6. Flexible sigmoidoscopy or colonoscopy. You may have a sigmoidoscopy every 5 years or a colonoscopy every 10 years starting at age 16. Hepatitis C blood test. Hepatitis B blood test. Sexually transmitted disease (STD) testing. Diabetes screening. This is done by checking your blood sugar (glucose) after you have not eaten for a while (fasting). You may have this done every 1-3 years. Bone density scan. This is done to screen for osteoporosis. You may have this done starting at age 56. Mammogram. This may be done every 1-2 years. Talk to your health care provider about how often you should have regular mammograms. Talk with your health care provider about your test results, treatment options, and if necessary, the need for more tests. Vaccines  Your health care provider may recommend certain vaccines, such as: Influenza vaccine. This is recommended every year. Tetanus, diphtheria, and acellular pertussis (Tdap, Td) vaccine. You may need a Td booster every 10 years. Zoster vaccine. You may need this after age 33. Pneumococcal 13-valent conjugate (PCV13) vaccine. One  dose is recommended after age 42. Pneumococcal polysaccharide (PPSV23) vaccine. One dose is recommended after age 51. Talk to your health care provider about which screenings and vaccines you need and how often you need them. This information is not intended to replace advice given to you by your health care provider. Make sure you discuss any questions you have with your health care provider. Document Released: 04/14/2015 Document Revised: 12/06/2015 Document Reviewed: 01/17/2015 Elsevier Interactive Patient Education  2017 [place] Prevention in the Home Falls can cause injuries. They can happen to people of all ages. There are many things you can do to make your home safe and to help prevent falls. What can I do on the outside of my home? Regularly fix the edges of walkways and driveways and fix any cracks. Remove anything that might make you trip as you walk through a door, such as a raised step or threshold. Trim any bushes or trees on the path to your home. Use bright outdoor lighting. Clear any walking paths of anything that might make someone trip, such as rocks or tools. Regularly check to see if handrails are loose or broken. Make sure that both sides of any steps have handrails. Any raised decks  and porches should have guardrails on the edges. Have any leaves, snow, or ice cleared regularly. Use sand or salt on walking paths during winter. Clean up any spills in your garage right away. This includes oil or grease spills. What can I do in the bathroom? Use night lights. Install grab bars by the toilet and in the tub and shower. Do not use towel bars as grab bars. Use non-skid mats or decals in the tub or shower. If you need to sit down in the shower, use a plastic, non-slip stool. Keep the floor dry. Clean up any water that spills on the floor as soon as it happens. Remove soap buildup in the tub or shower regularly. Attach bath mats securely with double-sided  non-slip rug tape. Do not have throw rugs and other things on the floor that can make you trip. What can I do in the bedroom? Use night lights. Make sure that you have a light by your bed that is easy to reach. Do not use any sheets or blankets that are too big for your bed. They should not hang down onto the floor. Have a firm chair that has side arms. You can use this for support while you get dressed. Do not have throw rugs and other things on the floor that can make you trip. What can I do in the kitchen? Clean up any spills right away. Avoid walking on wet floors. Keep items that you use a lot in easy-to-reach places. If you need to reach something above you, use a strong step stool that has a grab bar. Keep electrical cords out of the way. Do not use floor polish or wax that makes floors slippery. If you must use wax, use non-skid floor wax. Do not have throw rugs and other things on the floor that can make you trip. What can I do with my stairs? Do not leave any items on the stairs. Make sure that there are handrails on both sides of the stairs and use them. Fix handrails that are broken or loose. Make sure that handrails are as long as the stairways. Check any carpeting to make sure that it is firmly attached to the stairs. Fix any carpet that is loose or worn. Avoid having throw rugs at the top or bottom of the stairs. If you do have throw rugs, attach them to the floor with carpet tape. Make sure that you have a light switch at the top of the stairs and the bottom of the stairs. If you do not have them, ask someone to add them for you. What else can I do to help prevent falls? Wear shoes that: Do not have high heels. Have rubber bottoms. Are comfortable and fit you well. Are closed at the toe. Do not wear sandals. If you use a stepladder: Make sure that it is fully opened. Do not climb a closed stepladder. Make sure that both sides of the stepladder are locked into place. Ask  someone to hold it for you, if possible. Clearly mark and make sure that you can see: Any grab bars or handrails. First and last steps. Where the edge of each step is. Use tools that help you move around (mobility aids) if they are needed. These include: Canes. Walkers. Scooters. Crutches. Turn on the lights when you go into a dark area. Replace any light bulbs as soon as they burn out. Set up your furniture so you have a clear path. Avoid moving your furniture around.  If any of your floors are uneven, fix them. If there are any pets around you, be aware of where they are. Review your medicines with your doctor. Some medicines can make you feel dizzy. This can increase your chance of falling. Ask your doctor what other things that you can do to help prevent falls. This information is not intended to replace advice given to you by your health care provider. Make sure you discuss any questions you have with your health care provider. Document Released: 01/12/2009 Document Revised: 08/24/2015 Document Reviewed: 04/22/2014 Elsevier Interactive Patient Education  2017 [employer].

## 2022-05-21 NOTE — Progress Notes (Signed)
I connected with  Karen Bowman on 05/21/22 by a audio enabled telemedicine application and verified that I am speaking with the correct person using two identifiers.  Patient Location: Home  Provider Location: Home Office  I discussed the limitations of evaluation and management by telemedicine. The patient expressed understanding and agreed to proceed.  Subjective:   Karen Bowman is a 82 y.o. female who presents for Medicare Annual (Subsequent) preventive examination.  Review of Systems     Cardiac Risk Factors include: advanced age (>41mn, >>68women);hypertension;dyslipidemia     Objective:    Today's Vitals   05/21/22 1333 05/21/22 1334  Weight: 116 lb (52.6 kg)   Height: 5' 5"$  (1.651 m)   PainSc:  5    Body mass index is 19.3 kg/m.     05/21/2022    2:03 PM 07/08/2017    9:29 AM 12/05/2016    1:35 PM 10/24/2016   11:56 AM 05/31/2016   12:28 PM 11/21/2015    1:59 PM 10/31/2015    3:35 PM  Advanced Directives  Does Patient Have a Medical Advance Directive? No Yes Yes Yes Yes Yes Yes  Type of ASocial research officer, governmentLiving will HCasarLiving will Healthcare Power of ALake Medina ShoresLiving will  H[place]Living will  Copy of HCumingsin Chart?  No - copy requested   No - copy requested  No - copy requested  Would patient like information on creating a medical advance directive? No - Patient declined          Current Medications (verified) Outpatient Encounter Medications as of 05/21/2022  Medication Sig   amiodarone (PACERONE) 200 MG tablet TAKE 1/2 TABLET BY MOUTH DAILY   amLODipine (NORVASC) 2.5 MG tablet TAKE 1 TABLET BY MOUTH EVERY DAY   apixaban (ELIQUIS) 5 MG TABS tablet TAKE 1 TABLET BY MOUTH TWICE A DAY. NEED LAB WORK FOR REFILL   atorvastatin (LIPITOR) 40 MG tablet TAKE 1 TABLET BY MOUTH DAILY AT 6 PM.   Cholecalciferol 1000 UNITS capsule Take 2,000  Units by mouth daily.    CINNAMON PO Take 2 tablets by mouth daily.    Coenzyme Q10-Fish Oil-Vit E (CO-Q 10 OMEGA-3 FISH OIL) CAPS Take 1 capsule by mouth 2 (two) times daily.   conjugated estrogens (PREMARIN) vaginal cream Apply 0.568m(pea-sized amount)  just inside the vaginal introitus with a finger-tip on  Monday, Wednesday and Friday nights.   CRANBERRY PO Take 1 tablet by mouth 2 (two) times daily.    ezetimibe (ZETIA) 10 MG tablet TAKE 1 TABLET BY MOUTH EVERY DAY   LYSINE PO Take by mouth daily.   metoprolol tartrate (LOPRESSOR) 25 MG tablet Take 0.5 tablets (12.5 mg total) by mouth every morning AND 1 tablet (25 mg total) every evening. Take 1/2 tablet (12.40m39min the morning and one full tablet (240m86mn the evening..   Marland KitchenISC NATURAL PRODUCTS PO Take 1 tablet by mouth daily. Multivitamin-Dr. SinaTalmage Coinupirocin cream (BACTROBAN) 2 % Apply 1 application topically 2 (two) times daily.   NON FORMULARY Cell Rebuild Takes 1 tablet BID.   NON FORMULARY Canaflex 1 tablet daily.   NON FORMULARY Epivery Takes 1 tablet bid.   NON FORMULARY Limbex take one tablet daily   NON FORMULARY Sanguenol nitric oxide one tablet daily   NON FORMULARY Reprieve one tablet daily   OVER THE COUNTER MEDICATION Take 1,040 mg by mouth 2 (two)  times daily. Dandelion BID   TURMERIC PO Take by mouth 2 (two) times daily.   lidocaine (LIDODERM) 5 % Place 1 patch onto the skin daily. Remove & Discard patch within 12 hours or as directed by MD (Patient not taking: Reported on 05/21/2022)   No facility-administered encounter medications on file as of 05/21/2022.    Allergies (verified) Ciprofloxacin, Ciprofloxacin, Hydrochlorothiazide, Hydrochlorothiazide w-triamterene, Sulfa antibiotics, Sulfonamide derivatives, Valsartan, and Valsartan   History: Past Medical History:  Diagnosis Date   Acute heart failure ([place])    Anxiety    Arrhythmia    Arthritis    Atrial fibrillation ([place])    Basal cell carcinoma 11/19/2021    left forehead above brow - ED&C   Heart murmur    Hyperlipemia    Hypertension    Stroke [facility])    Past Surgical History:  Procedure Laterality Date   ABDOMINAL HYSTERECTOMY     APPENDECTOMY     BLADDER SUSPENSION     ELECTROPHYSIOLOGIC STUDY N/A 10/18/2014   Procedure: CARDIOVERSION;  Surgeon: Thayer Headings, MD;  Location: [facility] ORS;  Service: Cardiovascular;  Laterality: N/A;   TEE WITHOUT CARDIOVERSION N/A 10/18/2014   Procedure: TRANSESOPHAGEAL ECHOCARDIOGRAM (TEE);  Surgeon: Thayer Headings, MD;  Location: [facility] ORS;  Service: Cardiovascular;  Laterality: N/A;   TONSILLECTOMY     TUMOR REMOVAL     right arm   Family History  Problem Relation Age of Onset   Osteoporosis Mother    Heart disease Mother    Stroke Mother    Cancer Father    Diabetes Maternal Aunt    Social History   Socioeconomic History   Marital status: Married    Spouse name: Not on file   Number of children: 2   Years of education: Not on file   Highest education level: Not on file  Occupational History   Occupation: retired Network engineer  Tobacco Use   Smoking status: Never   Smokeless tobacco: Never  Vaping Use   Vaping Use: Never used  Substance and Sexual Activity   Alcohol use: No   Drug use: No   Sexual activity: Not Currently  Other Topics Concern   Not on file  Social History Narrative   ** Merged History Encounter **       Exercise:walking 2-3 times a week   End of life issues: Has living will.. Husband is HCPOA. Full code (reviewed 2014)   Social Determinants of Health   Financial Resource Strain: Low Risk  (05/21/2022)   Overall Financial Resource Strain (CARDIA)    Difficulty of Paying Living Expenses: Not hard at all  Food Insecurity: No Food Insecurity (05/21/2022)   Hunger Vital Sign    Worried About Running Out of Food in the Last Year: Never true    Ran Out of Food in the Last Year: Never true  Transportation Needs: No Transportation Needs (05/21/2022)   PRAPARE -  Hydrologist (Medical): No    Lack of Transportation (Non-Medical): No  Physical Activity: Inactive (05/21/2022)   Exercise Vital Sign    Days of Exercise per Week: 0 days    Minutes of Exercise per Session: 0 min  Stress: No Stress Concern Present (05/21/2022)   [place]    Feeling of Stress : Not at all  Social Connections: Socially Isolated (05/21/2022)   Social Connection and Isolation Panel [NHANES]    Frequency of Communication with Friends and Family:  More than three times a week    Frequency of Social Gatherings with Friends and Family: Twice a week    Attends Religious Services: Never    Marine scientist or Organizations: No    Attends Archivist Meetings: Never    Marital Status: Widowed    Tobacco Counseling Counseling given: Not Answered   Clinical Intake:  Pre-visit preparation completed: Yes  Pain : 0-10 Pain Score: 5  Pain Type: Chronic pain Pain Location: Back Pain Orientation: Left Pain Descriptors / Indicators: Aching Pain Frequency: Intermittent     Nutritional Risks: None Diabetes: No  How often do you need to have someone help you when you read instructions, pamphlets, or other written materials from your doctor or pharmacy?: 1 - Never  Diabetic?no  Interpreter Needed?: No  Information entered by :: C.Cortez Steelman LPN   Activities of Daily Living    05/21/2022    2:05 PM  In your present state of health, do you have any difficulty performing the following activities:  Hearing? 0  Vision? 0  Difficulty concentrating or making decisions? 0  Walking or climbing stairs? 1  Comment hip issues  Dressing or bathing? 0  Doing errands, shopping? 0  Preparing Food and eating ? N  Using the Toilet? N  In the past six months, have you accidently leaked urine? Y  Comment Followed by urologist  Do you have problems with loss of bowel control?  N  Managing your Medications? N  Managing your Finances? N  Housekeeping or managing your Housekeeping? N    Patient Care Team: Jinny Sanders, MD as PCP - General (Family Medicine) Minna Merritts, MD as PCP - Cardiology (Cardiology) Jinny Sanders, MD Alisa Graff, FNP as Nurse Practitioner (Cardiology) Minna Merritts, MD as Consulting Physician (Cardiology) [name], [facility] as Pharmacist (Pharmacist)  Indicate any recent Medical Services you may have received from other than [facility] providers in the past year (date may be approximate).     Assessment:   This is a routine wellness examination for Karen Bowman.  Hearing/Vision screen Hearing Screening - Comments:: No aids Vision Screening - Comments:: No -no eye doctor - no vision issues  Dietary issues and exercise activities discussed: Current Exercise Habits: The patient does not participate in regular exercise at present, Exercise limited by: orthopedic condition(s) (hip problems)   Goals Addressed   None    Depression Screen    05/21/2022    1:53 PM 05/25/2021    3:18 PM 05/23/2020    4:11 PM 12/22/2018    3:38 PM 07/08/2017    9:29 AM 05/31/2016   12:10 PM 10/02/2015    3:20 PM  PHQ 2/9 Scores  PHQ - 2 Score 0 0 1 0 0 0 0  PHQ- 9 Score   3  0      Fall Risk    05/21/2022    2:05 PM 05/25/2021    3:17 PM 05/23/2020    3:09 PM 12/22/2018    3:37 PM 07/08/2017    9:29 AM  Fall Risk   Falls in the past year? 0 0 1 0 No  Number falls in past yr: 0  0    Injury with Fall? 0  0    Risk for fall due to : No Fall Risks      Follow up Falls prevention discussed;Falls evaluation completed  Falls evaluation completed      FALL RISK PREVENTION PERTAINING TO THE HOME:  Any stairs in or around the home? No  If so, are there any without handrails? No  Home free of loose throw rugs in walkways, pet beds, electrical cords, etc? Yes  Adequate lighting in your home to reduce risk of falls? Yes   ASSISTIVE DEVICES UTILIZED  TO PREVENT FALLS:  Life alert? No  Use of a cane, walker or w/c? Yes  Grab bars in the bathroom? Yes  Shower chair or bench in shower? Yes  Elevated toilet seat or a handicapped toilet? No    Cognitive Function:    07/08/2017    9:48 AM 05/31/2016   12:14 PM  MMSE - Mini Mental State Exam  Orientation to time 5 5  Orientation to Place 5 5  Registration 3 3  Attention/ Calculation 0 0  Recall 2 3  Recall-comments unable to recall 1 of 3 words   Language- name 2 objects 0 0  Language- repeat 1 1  Language- follow 3 step command 3 3  Language- read & follow direction 0 0  Write a sentence 0 0  Copy design 0 0  Total score 19 20        05/21/2022    2:10 PM  6CIT Screen  What Year? 0 points  What month? 0 points  What time? 0 points  Count back from 20 0 points  Months in reverse 0 points  Repeat phrase 2 points  Total Score 2 points    Immunizations Immunization History  Administered Date(s) Administered   Moderna Sars-Covid-2 Vaccination 10/25/2019, 11/21/2019   Td 07/13/2008    TDAP status: Due, Education has been provided regarding the importance of this vaccine. Advised may receive this vaccine at local pharmacy or Health Dept. Aware to provide a copy of the vaccination record if obtained from local pharmacy or Health Dept. Verbalized acceptance and understanding.  Flu Vaccine status: Declined, Education has been provided regarding the importance of this vaccine but patient still declined. Advised may receive this vaccine at local pharmacy or Health Dept. Aware to provide a copy of the vaccination record if obtained from local pharmacy or Health Dept. Verbalized acceptance and understanding.  Pneumococcal vaccine status: Declined,  Education has been provided regarding the importance of this vaccine but patient still declined. Advised may receive this vaccine at local pharmacy or Health Dept. Aware to provide a copy of the vaccination record if obtained from local  pharmacy or Health Dept. Verbalized acceptance and understanding.   Covid-19 vaccine status: Declined, Education has been provided regarding the importance of this vaccine but patient still declined. Advised may receive this vaccine at local pharmacy or Health Dept.or vaccine clinic. Aware to provide a copy of the vaccination record if obtained from local pharmacy or Health Dept. Verbalized acceptance and understanding.  Qualifies for Shingles Vaccine? No   Zostavax completed No   Shingrix Completed?: No.    Education has been provided regarding the importance of this vaccine. Patient has been advised to call insurance company to determine out of pocket expense if they have not yet received this vaccine. Advised may also receive vaccine at local pharmacy or Health Dept. Verbalized acceptance and understanding.  Screening Tests Health Maintenance  Topic Date Due   Zoster Vaccines- Shingrix (1 of 2) Never done   Pneumonia Vaccine 41+ Years old (1 of 1 - PCV) Never done   DTaP/Tdap/Td (2 - Tdap) 07/14/2018   COVID-19 Vaccine (3 - Moderna risk series) 12/19/2019   INFLUENZA VACCINE  05/31/2025 (Originally 10/30/2021)  DEXA SCAN  05/31/2025 (Originally 10/07/2007)   Medicare Annual Wellness (AWV)  05/22/2023   Hepatitis C Screening  Completed   HPV VACCINES  Aged Out   COLONOSCOPY (Pts 45-107yr Insurance coverage will need to be confirmed)  Discontinued    Health Maintenance  Health Maintenance Due  Topic Date Due   Zoster Vaccines- Shingrix (1 of 2) Never done   Pneumonia Vaccine 80 Years old (1 of 1 - PCV) Never done   DTaP/Tdap/Td (2 - Tdap) 07/14/2018   COVID-19 Vaccine (3 - Moderna risk series) 12/19/2019    Colorectal cancer screening: No longer required.   Mammogram status: No longer required due to pt declined.  Bone Density - Pt declined  Lung Cancer Screening: (Low Dose CT Chest recommended if Age 80-80years, 30 pack-year currently smoking OR have quit w/in 15years.) does  not qualify.   Lung Cancer Screening Referral: no  Additional Screening:  Hepatitis C Screening: does not qualify; Completed 05/16/2020  Vision Screening: Recommended annual ophthalmology exams for early detection of glaucoma and other disorders of the eye. Is the patient up to date with their annual eye exam?  No  Who is the provider or what is the name of the office in which the patient attends annual eye exams? Pt declines If pt is not established with a provider, would they like to be referred to a provider to establish care? No .   Dental Screening: Recommended annual dental exams for proper oral hygiene  Community Resource Referral / Chronic Care Management: CRR required this visit?  No   CCM required this visit?  No      Plan:     I have personally reviewed and noted the following in the patient's chart:   Medical and social history Use of alcohol, tobacco or illicit drugs  Current medications and supplements including opioid prescriptions. Patient is not currently taking opioid prescriptions. Functional ability and status Nutritional status Physical activity Advanced directives List of other physicians Hospitalizations, surgeries, and ER visits in previous 12 months Vitals Screenings to include cognitive, depression, and falls Referrals and appointments  In addition, I have reviewed and discussed with patient certain preventive protocols, quality metrics, and best practice recommendations. A written personalized care plan for preventive services as well as general preventive health recommendations were provided to patient.     CLebron Conners LPN   [id number]  Nurse Notes: Vaccinations: declines all Influenza vaccine: recommend every Fall Pneumococcal vaccine: recommend once per lifetime Prevnar-20 Tdap vaccine: recommend every 10 years Shingles vaccine: recommend Shingrix which is 2 doses 2-6 months apart and over 90% effective     Covid-19: recommend  2 doses one month apart with a booster 6 months later  Pt declines all screenings.

## 2022-05-21 NOTE — Addendum Note (Signed)
Addended by: Ellamae Sia on: 05/21/2022 10:37 AM   Modules accepted: Orders

## 2022-05-23 ENCOUNTER — Encounter: Payer: Medicare Other | Admitting: Dermatology

## 2022-05-27 ENCOUNTER — Other Ambulatory Visit: Payer: Self-pay | Admitting: Cardiology

## 2022-05-27 ENCOUNTER — Other Ambulatory Visit: Payer: Self-pay | Admitting: Cardiovascular Disease

## 2022-05-28 ENCOUNTER — Ambulatory Visit (INDEPENDENT_AMBULATORY_CARE_PROVIDER_SITE_OTHER): Payer: Medicare Other | Admitting: Family Medicine

## 2022-05-28 ENCOUNTER — Encounter: Payer: Self-pay | Admitting: Family Medicine

## 2022-05-28 VITALS — BP 106/74 | HR 139 | Temp 98.0°F | Ht 64.25 in | Wt 122.2 lb

## 2022-05-28 DIAGNOSIS — R7989 Other specified abnormal findings of blood chemistry: Secondary | ICD-10-CM | POA: Diagnosis not present

## 2022-05-28 DIAGNOSIS — Z0000 Encounter for general adult medical examination without abnormal findings: Secondary | ICD-10-CM

## 2022-05-28 DIAGNOSIS — E7800 Pure hypercholesterolemia, unspecified: Secondary | ICD-10-CM

## 2022-05-28 DIAGNOSIS — I10 Essential (primary) hypertension: Secondary | ICD-10-CM

## 2022-05-28 DIAGNOSIS — I480 Paroxysmal atrial fibrillation: Secondary | ICD-10-CM

## 2022-05-28 DIAGNOSIS — I69359 Hemiplegia and hemiparesis following cerebral infarction affecting unspecified side: Secondary | ICD-10-CM

## 2022-05-28 DIAGNOSIS — I5022 Chronic systolic (congestive) heart failure: Secondary | ICD-10-CM

## 2022-05-28 DIAGNOSIS — R000 Tachycardia, unspecified: Secondary | ICD-10-CM | POA: Diagnosis not present

## 2022-05-28 NOTE — Assessment & Plan Note (Addendum)
EKG in office today shows atrial fibrillation and  non specific ST changes.  Will eval with labs to rule out secondary cause.  Will have her increase lopressor to 25 mg BID and follow up with cardiology. She is compliant with Eliquis anticoagulation.  Addendum: Contacted cardiology, Dr. Rockey Situ recommended amiodarone load.  His office will contact patient for follow-up and possible cardioversion.

## 2022-05-28 NOTE — Patient Instructions (Addendum)
Please stop at the lab to have labs drawn.  Try Flonase 2 spray per daily.. call if not improving as expected.  Increase lopressor to  25 mg BID.  Load amiodarone 400 twice daily for 7 days then down to 200 twice daily   Dr. Rockey Situ will call you with an appointment for follow up.   Go to ER if severe shortness of breath.

## 2022-05-28 NOTE — Assessment & Plan Note (Signed)
Stable, chronic.  Continue current medication.   LDL almost at goal < 70 on atorvastatin 40 mg and zetia 10 mg daily. She will get back on track with low chol diet.

## 2022-05-28 NOTE — Assessment & Plan Note (Signed)
Stable, uses cane to ambulate

## 2022-05-28 NOTE — Progress Notes (Signed)
Patient ID: Karen Bowman, female    DOB: [birth year], 82 y.o.   MRN: [id number]  This visit was conducted in person.  BP 106/74   Pulse (!) 139   Temp 98 F (36.7 C) (Temporal)   Ht 5' 4.25" (1.632 m)   Wt 122 lb 4 oz (55.5 kg)   LMP  (LMP Unknown)   SpO2 93%   BMI 20.82 kg/m    CC: Chief Complaint  Patient presents with   Annual Exam    Part 2   Shortness of Breath        Joint Swelling    Subjective:   HPI: IVERSON Bowman is a 82 y.o. female presenting on 05/28/2022 for Annual Exam (Part 2), Shortness of Breath (/), and Joint Swelling   The patient presents for  complete physical and review of chronic health problems. He/She also has the following acute concerns today: She has noted SOB in last months, noted peripheral swelling and not feeling well overall.  She cannot tolerate compression hose as too hard to get on.  She feels like she has extra fluid in abdomen as well.    She has been nasal discharge and intermittent cough x 2-3 weeks. No face pain. No fever.  Using neomed sinus.     Advance directives and end of life planning reviewed in detail with patient and documented in EMR. Patient given handout on advance care directives if needed. HCPOA and living will updated if needed.  No falls  Flowsheet Row Clinical Support from 05/21/2022 in [place] at [facility]  PHQ-2 Total Score 0      Hypertension:   Well controlled on amlodipine  and lopressor             BP Readings from Last 3 Encounters:  05/28/22 106/74  12/21/21 (!) 140/86  07/31/21 (!) 192/60  Using medication without problems or lightheadedness:  none Chest pain with exertion: no chest pain Edema:yes Short of breath:yes Average home BPs: Other issues:  Elevated Cholesterol: LDL  at goal < 70 on atorvastatin 40 mg daily and zetia 10 mg daily. Lab Results  Component Value Date   CHOL 125 05/21/2022   HDL 46.30 05/21/2022   LDLCALC 63 05/21/2022    LDLDIRECT 206.0 03/08/2013   TRIG 80.0 05/21/2022   CHOLHDL 3 05/21/2022  Using medications without problems: Muscle aches:  Diet compliance:  moderate Exercise: use cane Other complaints: Wt Readings from Last 3 Encounters:  05/28/22 122 lb 4 oz (55.5 kg)  05/21/22 116 lb (52.6 kg)  12/21/21 116 lb (52.6 kg)   Also new onset elevated LFTs: no new supplements but she takes a lot of supplements.  No ETOH, no tylenol.  No family history of liver issues.   CAD, Paroxsysmal atrial fibrillation, CHF second degree AV block: followed by Dr. Rockey Situ... reviewed last note from NP  11/2021 At that time she was in aflutter with heart rate of 131  At that time increased metoprolol to 25 mg BID...but planned slow increase.   On eliquis, amlodipine , amiodarone and metoprolol, aggressive cholesterol control.  She states she has been compliant.   July 2016 for atrial fibrillation and was discharged on amiodarone, anticoagulation, digoxin, and metoprolol. Echocardiogram revealed LVEF 35-40%, moderately dilated right and left atrium, mild MR, and moderate TR, Hospitalized again in 05/2015 for stroke with residual right sided weakness. Echocardiogram repeated and revealed LVEF 55-60%, NWMA, mitral valve with moderately calcified annulus, and mild  TR. CT angio of the neck in 2017 revealed moderate to severe bilateral proximal PCA stenoses and mild right cavernous and supraclinoid ICA stenosis, no cervical carotid or vertebral artery stenosis.  HX of CVA  Relevant past medical, surgical, family and social history reviewed and updated as indicated. Interim medical history since our last visit reviewed. Allergies and medications reviewed and updated. Outpatient Medications Prior to Visit  Medication Sig Dispense Refill   amiodarone (PACERONE) 200 MG tablet TAKE 1/2 TABLET BY MOUTH DAILY 45 tablet 1   amLODipine (NORVASC) 2.5 MG tablet TAKE 1 TABLET BY MOUTH EVERY DAY 90 tablet 1   apixaban (ELIQUIS) 5 MG  TABS tablet TAKE 1 TABLET BY MOUTH TWICE A DAY. NEED LAB WORK FOR REFILL 180 tablet 2   atorvastatin (LIPITOR) 40 MG tablet TAKE 1 TABLET BY MOUTH DAILY AT 6 PM. 90 tablet 0   Cholecalciferol 1000 UNITS capsule Take 2,000 Units by mouth daily.      CINNAMON PO Take 1 tablet by mouth daily.     Coenzyme Q10-Fish Oil-Vit E (CO-Q 10 OMEGA-3 FISH OIL) CAPS Take 1 capsule by mouth 2 (two) times daily.     conjugated estrogens (PREMARIN) vaginal cream Apply 0.'5mg'$  (pea-sized amount)  just inside the vaginal introitus with a finger-tip on  Monday, Wednesday and Friday nights. 30 g 12   CRANBERRY PO Take 1 tablet by mouth 2 (two) times daily.      ezetimibe (ZETIA) 10 MG tablet TAKE 1 TABLET BY MOUTH EVERY DAY 90 tablet 2   LYSINE PO Take by mouth daily.     metoprolol tartrate (LOPRESSOR) 25 MG tablet Take 0.5 tablets (12.5 mg total) by mouth every morning AND 1 tablet (25 mg total) every evening. Take 1/2 tablet (12.'5mg'$ ) in the morning and one full tablet ('25mg'$ ) in the evening.. 135 tablet 1   MISC NATURAL PRODUCTS PO Take 1 tablet by mouth daily. Multivitamin-Dr. Talmage Coin     mupirocin cream (BACTROBAN) 2 % Apply 1 application topically 2 (two) times daily. 15 g 0   NON FORMULARY Cell Rebuild Takes 1 tablet BID.     NON FORMULARY Canaflex 1 tablet daily.     NON FORMULARY Epivery Takes 1 tablet bid.     NON FORMULARY Limbex take one tablet daily     NON FORMULARY Sanguenol nitric oxide one tablet daily     NON FORMULARY Reprieve one tablet daily     OVER THE COUNTER MEDICATION Take 1,040 mg by mouth 2 (two) times daily. Dandelion BID     TURMERIC PO Take by mouth 2 (two) times daily.     lidocaine (LIDODERM) 5 % Place 1 patch onto the skin daily. Remove & Discard patch within 12 hours or as directed by MD (Patient not taking: Reported on 05/21/2022) 10 patch 0   No facility-administered medications prior to visit.     Per HPI unless specifically indicated in ROS section below Review of Systems   Constitutional:  Negative for fatigue and fever.  HENT:  Negative for congestion.   Eyes:  Negative for pain.  Respiratory:  Negative for cough and shortness of breath.   Cardiovascular:  Negative for chest pain, palpitations and leg swelling.  Gastrointestinal:  Negative for abdominal pain.  Genitourinary:  Negative for dysuria and vaginal bleeding.  Musculoskeletal:  Negative for back pain.  Neurological:  Negative for syncope, light-headedness and headaches.  Psychiatric/Behavioral:  Negative for dysphoric mood.    Objective:  BP 106/74   Pulse Marland Kitchen)  139   Temp 98 F (36.7 C) (Temporal)   Ht 5' 4.25" (1.632 m)   Wt 122 lb 4 oz (55.5 kg)   LMP  (LMP Unknown)   SpO2 93%   BMI 20.82 kg/m   Wt Readings from Last 3 Encounters:  05/28/22 122 lb 4 oz (55.5 kg)  05/21/22 116 lb (52.6 kg)  12/21/21 116 lb (52.6 kg)      Physical Exam Constitutional:      General: She is not in acute distress.    Appearance: Normal appearance. She is well-developed. She is not ill-appearing or toxic-appearing.  HENT:     Head: Normocephalic.     Right Ear: Hearing, tympanic membrane, ear canal and external ear normal. Tympanic membrane is not erythematous, retracted or bulging.     Left Ear: Hearing, tympanic membrane, ear canal and external ear normal. Tympanic membrane is not erythematous, retracted or bulging.     Nose: No mucosal edema or rhinorrhea.     Right Sinus: No maxillary sinus tenderness or frontal sinus tenderness.     Left Sinus: No maxillary sinus tenderness or frontal sinus tenderness.     Mouth/Throat:     Pharynx: Uvula midline.  Eyes:     General: Lids are normal. Lids are everted, no foreign bodies appreciated.     Conjunctiva/sclera: Conjunctivae normal.     Pupils: Pupils are equal, round, and reactive to light.  Neck:     Thyroid: No thyroid mass or thyromegaly.     Vascular: No carotid bruit.     Trachea: Trachea normal.  Cardiovascular:     Rate and Rhythm: Normal  rate. Rhythm irregularly irregular.     Pulses: Normal pulses.     Heart sounds: Normal heart sounds, S1 normal and S2 normal. No murmur heard.    No friction rub. No gallop.  Pulmonary:     Effort: Pulmonary effort is normal. No tachypnea or respiratory distress.     Breath sounds: Normal breath sounds. No decreased breath sounds, wheezing, rhonchi or rales.  Abdominal:     General: Bowel sounds are normal.     Palpations: Abdomen is soft.     Tenderness: There is no abdominal tenderness.  Musculoskeletal:     Cervical back: Normal range of motion and neck supple.     Right lower leg: 2+ Edema present.     Left lower leg: 2+ Edema present.  Skin:    General: Skin is warm and dry.     Findings: No rash.     Comments: Skin lesion raised skin colored/slightly hyperpigmented, rough above left eye.Marland Kitchen appear similar to picture taken 1 year ago.. slightly larger and less red.  Neurological:     Mental Status: She is alert.  Psychiatric:        Mood and Affect: Mood is not anxious or depressed.        Speech: Speech normal.        Behavior: Behavior normal. Behavior is cooperative.        Thought Content: Thought content normal.        Judgment: Judgment normal.       Results for orders placed or performed in visit on 05/28/22  CBC with Differential/Platelet  Result Value Ref Range   WBC 6.6 4.0 - 10.5 K/uL   RBC 5.04 3.87 - 5.11 Mil/uL   Hemoglobin 14.8 12.0 - 15.0 g/dL   HCT 45.2 36.0 - 46.0 %   MCV 89.7 78.0 - 100.0 fl  MCHC 32.7 30.0 - 36.0 g/dL   RDW 15.8 (H) 11.5 - 15.5 %   Platelets 168.0 150.0 - 400.0 K/uL   Neutrophils Relative % 59.7 43.0 - 77.0 %   Lymphocytes Relative 28.8 12.0 - 46.0 %   Monocytes Relative 10.3 3.0 - 12.0 %   Eosinophils Relative 0.5 0.0 - 5.0 %   Basophils Relative 0.7 0.0 - 3.0 %   Neutro Abs 3.9 1.4 - 7.7 K/uL   Lymphs Abs 1.9 0.7 - 4.0 K/uL   Monocytes Absolute 0.7 0.1 - 1.0 K/uL   Eosinophils Absolute 0.0 0.0 - 0.7 K/uL   Basophils  Absolute 0.0 0.0 - 0.1 K/uL  TSH  Result Value Ref Range   TSH 22.29 (H) 0.35 - 5.50 uIU/mL  Hepatitis panel, acute  Result Value Ref Range   Hep A IgM NON-REACTIVE NON-REACTIVE   Hepatitis B Surface Ag NON-REACTIVE NON-REACTIVE   Hep B C IgM NON-REACTIVE NON-REACTIVE   Hepatitis C Ab NON-REACTIVE NON-REACTIVE  Hepatic Function Panel  Result Value Ref Range   Total Bilirubin 1.2 0.2 - 1.2 mg/dL   Bilirubin, Direct 0.3 0.0 - 0.3 mg/dL   Alkaline Phosphatase 115 39 - 117 U/L   AST 60 (H) 0 - 37 U/L   ALT 95 (H) 0 - 35 U/L   Total Protein 6.3 6.0 - 8.3 g/dL   Albumin 3.8 3.5 - 5.2 g/dL  Brain natriuretic peptide  Result Value Ref Range   Pro B Natriuretic peptide (BNP) 1,300.0 (H) 0.0 - 100.0 pg/mL    This visit occurred during the SARS-CoV-2 public health emergency.  Safety protocols were in place, including screening questions prior to the visit, additional usage of staff PPE, and extensive cleaning of exam room while observing appropriate contact time as indicated for disinfecting solutions.   COVID 19 screen:  No recent travel or known exposure to COVID19 The patient denies respiratory symptoms of COVID 19 at this time. The importance of social distancing was discussed today.   Assessment and Plan The patient's preventative maintenance and recommended screening tests for an annual wellness exam were reviewed in full today. Brought up to date unless services declined.  Counselled on the importance of diet, exercise, and its role in overall health and mortality. The patient's FH and SH was reviewed, including their home life, tobacco status, and drug and alcohol status.   DEXA done in 2010.Marland Kitchen Not interested in repeating 2023 Vaccines:Due for PNA and shingles. SE to flu vaccine in past.  She is not interested.  COVID vaccine: 11/21/2019 , , plans to get booster Due for mammogram,never went last year, not interested in repeating 2024 DVE/pap: not indicated, s/p  hysterectomy Colon:never sent back ifob 2014, 2015 or 2016.Marland Kitchen Refused to complete... no further indicated after age 11.   Problem List Items Addressed This Visit     Chronic systolic congestive heart failure (HCC)    Chronic.. increased swelling in extremities today.  Last ECHO 2017.. nml EF ( previously reduced) Potentially secondary to poor control of current atrial fibrillation episode.  Will treat atrial fibrillation as mentioned elsewhere.  Will evaluate with BNP.  Lungs appear clear to auscultation without clear pulmonary edema.  Blood pressure is low normal so I am hesitant to start Lasix.  Will evaluate with BNP and make the decision at that time.  She will follow her blood pressure at home.      Relevant Orders   Brain natriuretic peptide (Completed)   Ambulatory referral to Cardiology  Elevated LFTs    New, potentially secondary to fluid overload but will recheck today along with a hepatitis panel.  No alcohol or Tylenol use.  Per patient no family history of liver problems.      Relevant Orders   Hepatitis panel, acute (Completed)   Hepatic Function Panel (Completed)   Hemiparesis affecting dominant side as late effect of stroke (HCC)    Stable, uses cane to ambulate      HLD (hyperlipidemia)    Stable, chronic.  Continue current medication.   LDL almost at goal < 70 on atorvastatin 40 mg and zetia 10 mg daily. She will get back on track with low chol diet.       HYPERTENSION, BENIGN ESSENTIAL (Chronic)    Stable, chronic.  Continue current medication.  Amlodipine 10 mg daily. Lopressor 25 mg 1/2 tablet in AM and 1 full tablet at night.      Paroxysmal atrial fibrillation (HCC)    EKG in office today shows atrial fibrillation and  non specific ST changes.  Will eval with labs to rule out secondary cause.  Will have her increase lopressor to 25 mg BID and follow up with cardiology. She is compliant with Eliquis anticoagulation.  Addendum: Contacted  cardiology, Dr. Rockey Situ recommended amiodarone load.  His office will contact patient for follow-up and possible cardioversion.        Relevant Orders   CBC with Differential/Platelet (Completed)   TSH (Completed)   Ambulatory referral to Cardiology   Other Visit Diagnoses     Routine general medical examination at a health care facility    -  Primary   Tachycardia       Relevant Orders   EKG 12-Lead (Completed)       [name], MD

## 2022-05-28 NOTE — Assessment & Plan Note (Addendum)
Chronic.. increased swelling in extremities today.  Last ECHO 2017.. nml EF ( previously reduced) Potentially secondary to poor control of current atrial fibrillation episode.  Will treat atrial fibrillation as mentioned elsewhere.  Will evaluate with BNP.  Lungs appear clear to auscultation without clear pulmonary edema.  Blood pressure is low normal so I am hesitant to start Lasix.  Will evaluate with BNP and make the decision at that time.  She will follow her blood pressure at home.

## 2022-05-28 NOTE — Assessment & Plan Note (Signed)
Stable, chronic.  Continue current medication.  Amlodipine 10 mg daily. Lopressor 25 mg 1/2 tablet in AM and 1 full tablet at night.

## 2022-05-29 ENCOUNTER — Other Ambulatory Visit: Payer: Self-pay | Admitting: Family Medicine

## 2022-05-29 ENCOUNTER — Other Ambulatory Visit (INDEPENDENT_AMBULATORY_CARE_PROVIDER_SITE_OTHER): Payer: Medicare Other

## 2022-05-29 DIAGNOSIS — R7989 Other specified abnormal findings of blood chemistry: Secondary | ICD-10-CM

## 2022-05-29 LAB — CBC WITH DIFFERENTIAL/PLATELET
Basophils Absolute: 0 10*3/uL (ref 0.0–0.1)
Basophils Relative: 0.7 % (ref 0.0–3.0)
Eosinophils Absolute: 0 10*3/uL (ref 0.0–0.7)
Eosinophils Relative: 0.5 % (ref 0.0–5.0)
HCT: 45.2 % (ref 36.0–46.0)
Hemoglobin: 14.8 g/dL (ref 12.0–15.0)
Lymphocytes Relative: 28.8 % (ref 12.0–46.0)
Lymphs Abs: 1.9 10*3/uL (ref 0.7–4.0)
MCHC: 32.7 g/dL (ref 30.0–36.0)
MCV: 89.7 fl (ref 78.0–100.0)
Monocytes Absolute: 0.7 10*3/uL (ref 0.1–1.0)
Monocytes Relative: 10.3 % (ref 3.0–12.0)
Neutro Abs: 3.9 10*3/uL (ref 1.4–7.7)
Neutrophils Relative %: 59.7 % (ref 43.0–77.0)
Platelets: 168 10*3/uL (ref 150.0–400.0)
RBC: 5.04 Mil/uL (ref 3.87–5.11)
RDW: 15.8 % — ABNORMAL HIGH (ref 11.5–15.5)
WBC: 6.6 10*3/uL (ref 4.0–10.5)

## 2022-05-29 LAB — HEPATITIS PANEL, ACUTE
Hep A IgM: NONREACTIVE
Hep B C IgM: NONREACTIVE
Hepatitis B Surface Ag: NONREACTIVE
Hepatitis C Ab: NONREACTIVE

## 2022-05-29 LAB — TSH: TSH: 22.29 u[IU]/mL — ABNORMAL HIGH (ref 0.35–5.50)

## 2022-05-29 LAB — T4, FREE: Free T4: 0.64 ng/dL (ref 0.60–1.60)

## 2022-05-29 LAB — HEPATIC FUNCTION PANEL
ALT: 95 U/L — ABNORMAL HIGH (ref 0–35)
AST: 60 U/L — ABNORMAL HIGH (ref 0–37)
Albumin: 3.8 g/dL (ref 3.5–5.2)
Alkaline Phosphatase: 115 U/L (ref 39–117)
Bilirubin, Direct: 0.3 mg/dL (ref 0.0–0.3)
Total Bilirubin: 1.2 mg/dL (ref 0.2–1.2)
Total Protein: 6.3 g/dL (ref 6.0–8.3)

## 2022-05-29 LAB — T3, FREE: T3, Free: 2.8 pg/mL (ref 2.3–4.2)

## 2022-05-29 LAB — BRAIN NATRIURETIC PEPTIDE: Pro B Natriuretic peptide (BNP): 1300 pg/mL — ABNORMAL HIGH (ref 0.0–100.0)

## 2022-05-29 MED ORDER — FUROSEMIDE 20 MG PO TABS: 20.0000 mg | ORAL_TABLET | Freq: Every day | ORAL | 3 refills | Status: DC

## 2022-05-29 MED ORDER — POTASSIUM CHLORIDE CRYS ER 20 MEQ PO TBCR: 20.0000 meq | EXTENDED_RELEASE_TABLET | Freq: Every day | ORAL | 3 refills | Status: DC

## 2022-05-31 NOTE — Assessment & Plan Note (Signed)
New, potentially secondary to fluid overload but will recheck today along with a hepatitis panel.  No alcohol or Tylenol use.  Per patient no family history of liver problems.

## 2022-06-03 ENCOUNTER — Ambulatory Visit (INDEPENDENT_AMBULATORY_CARE_PROVIDER_SITE_OTHER): Payer: Medicare Other | Admitting: Dermatology

## 2022-06-03 DIAGNOSIS — L821 Other seborrheic keratosis: Secondary | ICD-10-CM

## 2022-06-03 DIAGNOSIS — L814 Other melanin hyperpigmentation: Secondary | ICD-10-CM

## 2022-06-03 DIAGNOSIS — Z85828 Personal history of other malignant neoplasm of skin: Secondary | ICD-10-CM

## 2022-06-03 DIAGNOSIS — D229 Melanocytic nevi, unspecified: Secondary | ICD-10-CM

## 2022-06-03 DIAGNOSIS — L578 Other skin changes due to chronic exposure to nonionizing radiation: Secondary | ICD-10-CM

## 2022-06-03 DIAGNOSIS — D225 Melanocytic nevi of trunk: Secondary | ICD-10-CM

## 2022-06-03 DIAGNOSIS — D1801 Hemangioma of skin and subcutaneous tissue: Secondary | ICD-10-CM

## 2022-06-03 DIAGNOSIS — Z1283 Encounter for screening for malignant neoplasm of skin: Secondary | ICD-10-CM | POA: Diagnosis not present

## 2022-06-03 NOTE — Progress Notes (Signed)
   Follow-Up Visit   Subjective  Karen Bowman is a 82 y.o. female who presents for the following: Annual Exam. Hx of BCC.  The patient presents for Total-Body Skin Exam (TBSE) for skin cancer screening and mole check.  The patient has spots, moles and lesions to be evaluated, some may be new or changing and the patient has concerns that these could be cancer.   The following portions of the chart were reviewed this encounter and updated as appropriate:   Tobacco  Allergies  Meds  Problems  Med Hx  Surg Hx  Fam Hx     Review of Systems:  No other skin or systemic complaints except as noted in HPI or Assessment and Plan.  Objective  Well appearing patient in no apparent distress; mood and affect are within normal limits.  A full examination was performed including scalp, head, eyes, ears, nose, lips, neck, chest, axillae, abdomen, back, buttocks, bilateral upper extremities, bilateral lower extremities, hands, feet, fingers, toes, fingernails, and toenails. All findings within normal limits unless otherwise noted below.  chest - Tan-brown and/or pink-flesh-colored symmetric macules and papules   Assessment & Plan  Nevus chest - Benign appearing on exam today - Observation - Call clinic for new or changing moles - Recommend daily use of broad spectrum spf 30+ sunscreen to sun-exposed areas.    Lentigines - Scattered tan macules - Due to sun exposure - Benign-appearing, observe - Recommend daily broad spectrum sunscreen SPF 30+ to sun-exposed areas, reapply every 2 hours as needed. - Call for any changes  Seborrheic Keratoses - Stuck-on, waxy, tan-brown papules and/or plaques  - Benign-appearing - Discussed benign etiology and prognosis. - Observe - Call for any changes  Melanocytic Nevi - Tan-brown and/or pink-flesh-colored symmetric macules and papules - Benign appearing on exam today - Observation - Call clinic for new or changing moles - Recommend daily use  of broad spectrum spf 30+ sunscreen to sun-exposed areas.   Hemangiomas - Red papules - Discussed benign nature - Observe - Call for any changes  Actinic Damage - Chronic condition, secondary to cumulative UV/sun exposure - diffuse scaly erythematous macules with underlying dyspigmentation - Recommend daily broad spectrum sunscreen SPF 30+ to sun-exposed areas, reapply every 2 hours as needed.  - Staying in the shade or wearing long sleeves, sun glasses (UVA+UVB protection) and wide brim hats (4-inch brim around the entire circumference of the hat) are also recommended for sun protection.  - Call for new or changing lesions.  History of Basal Cell Carcinoma of the Skin Left forehead above brow 11/19/21 - No evidence of recurrence today - Recommend regular full body skin exams - Recommend daily broad spectrum sunscreen SPF 30+ to sun-exposed areas, reapply every 2 hours as needed.  - Call if any new or changing lesions are noted between office visits   Skin cancer screening performed today.   Return in about 1 year (around 06/03/2023) for TBSE, hx of BCC.  IMarye Round, CMA, am acting as scribe for Sarina Ser, MD .  Documentation: I have reviewed the above documentation for accuracy and completeness, and I agree with the above.  Sarina Ser, MD

## 2022-06-03 NOTE — Patient Instructions (Signed)
Due to recent changes in healthcare laws, you may see results of your pathology and/or laboratory studies on MyChart before the doctors have had a chance to review them. We understand that in some cases there may be results that are confusing or concerning to you. Please understand that not all results are received at the same time and often the doctors may need to interpret multiple results in order to provide you with the best plan of care or course of treatment. Therefore, we ask that you please give us 2 business days to thoroughly review all your results before contacting the office for clarification. Should we see a critical lab result, you will be contacted sooner.   If You Need Anything After Your Visit  If you have any questions or concerns for your doctor, please call our main line at [phone] and press option 4 to reach your doctor's medical assistant. If no one answers, please leave a voicemail as directed and we will return your call as soon as possible. Messages left after 4 pm will be answered the following business day.   You may also send us a message via MyChart. We typically respond to MyChart messages within 1-2 business days.  For prescription refills, please ask your pharmacy to contact our office. Our fax number is [phone].  If you have an urgent issue when the clinic is closed that cannot wait until the next business day, you can page your doctor at the number below.    Please note that while we do our best to be available for urgent issues outside of office hours, we are not available 24/7.   If you have an urgent issue and are unable to reach us, you may choose to seek medical care at your doctor's office, retail clinic, urgent care center, or emergency room.  If you have a medical emergency, please immediately call 911 or go to the emergency department.  Pager Numbers  - Dr. Kowalski: [phone]  - Dr. Moye: [phone]  - Dr. Stewart:  [phone]  In the event of inclement weather, please call our main line at [phone] for an update on the status of any delays or closures.  Dermatology Medication Tips: Please keep the boxes that topical medications come in in order to help keep track of the instructions about where and how to use these. Pharmacies typically print the medication instructions only on the boxes and not directly on the medication tubes.   If your medication is too expensive, please contact our office at [phone] option 4 or send us a message through MyChart.   We are unable to tell what your co-pay for medications will be in advance as this is different depending on your insurance coverage. However, we may be able to find a substitute medication at lower cost or fill out paperwork to get insurance to cover a needed medication.   If a prior authorization is required to get your medication covered by your insurance company, please allow us 1-2 business days to complete this process.  Drug prices often vary depending on where the prescription is filled and some pharmacies may offer cheaper prices.  The website www.goodrx.com contains coupons for medications through different pharmacies. The prices here do not account for what the cost may be with help from insurance (it may be cheaper with your insurance), but the website can give you the price if you did not use any insurance.  - You can print the associated coupon and take it with   your prescription to the pharmacy.  - You may also stop by our office during regular business hours and pick up a GoodRx coupon card.  - If you need your prescription sent electronically to a different pharmacy, notify our office through [facility] MyChart or by phone at [phone] option 4.     Si Usted Necesita Algo Despus de Su Visita  Tambin puede enviarnos un mensaje a travs de MyChart. Por lo general respondemos a los mensajes de MyChart en el transcurso de 1 a 2  das hbiles.  Para renovar recetas, por favor pida a su farmacia que se ponga en contacto con nuestra oficina. Nuestro nmero de fax es el [phone].  Si tiene un asunto urgente cuando la clnica est cerrada y que no puede esperar hasta el siguiente da hbil, puede llamar/localizar a su doctor(a) al nmero que aparece a continuacin.   Por favor, tenga en cuenta que aunque hacemos todo lo posible para estar disponibles para asuntos urgentes fuera del horario de oficina, no estamos disponibles las 24 horas del da, los 7 das de la semana.   Si tiene un problema urgente y no puede comunicarse con nosotros, puede optar por buscar atencin mdica  en el consultorio de su doctor(a), en una clnica privada, en un centro de atencin urgente o en una sala de emergencias.  Si tiene una emergencia mdica, por favor llame inmediatamente al 911 o vaya a la sala de emergencias.  Nmeros de bper  - Dr. Kowalski: [phone]  - Dra. Moye: [phone]  - Dra. Stewart: [phone]  En caso de inclemencias del tiempo, por favor llame a nuestra lnea principal al [phone] para una actualizacin sobre el estado de cualquier retraso o cierre.  Consejos para la medicacin en dermatologa: Por favor, guarde las cajas en las que vienen los medicamentos de uso tpico para ayudarle a seguir las instrucciones sobre dnde y cmo usarlos. Las farmacias generalmente imprimen las instrucciones del medicamento slo en las cajas y no directamente en los tubos del medicamento.   Si su medicamento es muy caro, por favor, pngase en contacto con nuestra oficina llamando al [phone] y presione la opcin 4 o envenos un mensaje a travs de MyChart.   No podemos decirle cul ser su copago por los medicamentos por adelantado ya que esto es diferente dependiendo de la cobertura de su seguro. Sin embargo, es posible que podamos encontrar un medicamento sustituto a menor costo o llenar un formulario para que el  seguro cubra el medicamento que se considera necesario.   Si se requiere una autorizacin previa para que su compaa de seguros cubra su medicamento, por favor permtanos de 1 a 2 das hbiles para completar este proceso.  Los precios de los medicamentos varan con frecuencia dependiendo del lugar de dnde se surte la receta y alguna farmacias pueden ofrecer precios ms baratos.  El sitio web www.goodrx.com tiene cupones para medicamentos de diferentes farmacias. Los precios aqu no tienen en cuenta lo que podra costar con la ayuda del seguro (puede ser ms barato con su seguro), pero el sitio web puede darle el precio si no utiliz ningn seguro.  - Puede imprimir el cupn correspondiente y llevarlo con su receta a la farmacia.  - Tambin puede pasar por nuestra oficina durante el horario de atencin regular y recoger una tarjeta de cupones de GoodRx.  - Si necesita que su receta se enve electrnicamente a una farmacia diferente, informe a nuestra oficina a travs de MyChart de [place]   o por telfono llamando al [phone] y presione la opcin 4.  

## 2022-06-04 DIAGNOSIS — K080 Exfoliation of teeth due to systemic causes: Secondary | ICD-10-CM | POA: Diagnosis not present

## 2022-06-07 ENCOUNTER — Other Ambulatory Visit: Payer: Self-pay | Admitting: Family Medicine

## 2022-06-07 DIAGNOSIS — E038 Other specified hypothyroidism: Secondary | ICD-10-CM | POA: Insufficient documentation

## 2022-06-07 DIAGNOSIS — I5022 Chronic systolic (congestive) heart failure: Secondary | ICD-10-CM

## 2022-06-07 MED ORDER — LEVOTHYROXINE SODIUM 25 MCG PO TABS: 25.0000 ug | ORAL_TABLET | Freq: Every day | ORAL | 3 refills | Status: DC

## 2022-06-07 NOTE — Progress Notes (Signed)
Rx for low dose levothyroxine sent in. When she calls back with weights/BP.Marland Kitchen make sure she make an appt for recheck thyroid labs in 4  weeks.  Orders placed.

## 2022-06-07 NOTE — Progress Notes (Signed)
Noted  

## 2022-06-07 NOTE — Progress Notes (Signed)
Spoke with Karen Bowman this afternoon.  She states she is unable to locate scales to do a weight but she did take her BP x 3: 157/59, 156/61 and 150/59.  Lab appointment scheduled to repeat Thyroid labs 07/05/22 at 2:00 pm.

## 2022-06-08 ENCOUNTER — Encounter: Payer: Self-pay | Admitting: Dermatology

## 2022-06-12 ENCOUNTER — Telehealth: Payer: Self-pay

## 2022-06-12 ENCOUNTER — Encounter: Payer: Self-pay | Admitting: Cardiovascular Disease

## 2022-06-12 ENCOUNTER — Ambulatory Visit: Payer: Medicare Other | Attending: Cardiovascular Disease | Admitting: Cardiovascular Disease

## 2022-06-12 VITALS — BP 170/80 | HR 52 | Ht 64.0 in | Wt 108.2 lb

## 2022-06-12 DIAGNOSIS — I480 Paroxysmal atrial fibrillation: Secondary | ICD-10-CM | POA: Diagnosis not present

## 2022-06-12 DIAGNOSIS — E782 Mixed hyperlipidemia: Secondary | ICD-10-CM

## 2022-06-12 DIAGNOSIS — I63312 Cerebral infarction due to thrombosis of left middle cerebral artery: Secondary | ICD-10-CM | POA: Diagnosis not present

## 2022-06-12 DIAGNOSIS — I5022 Chronic systolic (congestive) heart failure: Secondary | ICD-10-CM

## 2022-06-12 DIAGNOSIS — I10 Essential (primary) hypertension: Secondary | ICD-10-CM | POA: Diagnosis not present

## 2022-06-12 DIAGNOSIS — I4892 Unspecified atrial flutter: Secondary | ICD-10-CM

## 2022-06-12 MED ORDER — METOPROLOL TARTRATE 25 MG PO TABS: 12.5000 mg | ORAL_TABLET | Freq: Two times a day (BID) | ORAL | 0 refills | Status: DC

## 2022-06-12 MED ORDER — DOXAZOSIN MESYLATE 1 MG PO TABS: ORAL_TABLET | ORAL | 1 refills | Status: DC

## 2022-06-12 MED ORDER — APIXABAN 2.5 MG PO TABS: 2.5000 mg | ORAL_TABLET | Freq: Two times a day (BID) | ORAL | 3 refills | Status: DC

## 2022-06-12 MED ORDER — FUROSEMIDE 20 MG PO TABS: 20.0000 mg | ORAL_TABLET | ORAL | 1 refills | Status: DC

## 2022-06-12 MED ORDER — LISINOPRIL 20 MG PO TABS: 20.0000 mg | ORAL_TABLET | Freq: Every day | ORAL | 3 refills | Status: DC

## 2022-06-12 MED ORDER — AMIODARONE HCL 200 MG PO TABS: 200.0000 mg | ORAL_TABLET | Freq: Every day | ORAL | 0 refills | Status: DC

## 2022-06-12 MED ORDER — POTASSIUM CHLORIDE CRYS ER 20 MEQ PO TBCR: 20.0000 meq | EXTENDED_RELEASE_TABLET | ORAL | 1 refills | Status: DC

## 2022-06-12 NOTE — Progress Notes (Signed)
Cardiology Office Note  Date:  06/12/2022   ID:  Karen Bowman, DOB [birth year], MRN [id number]  PCP:  Jinny Sanders, MD   Chief Complaint  Patient presents with   Atrial Fibrillation    Patient c/o shortness of breath, bilateral LE edema and A-Fib. Medications reviewed by the patient verbally.     HPI:  82 year old woman with history of  paroxysmal atrial fibrillation,  hospitalization July 5 with discharge 10/20/2014 for atrial fibrillation,  discharged on amiodarone, anticoagulation, digoxin, metoprolol.  hyperlipidemia stroke March 2017, still right leg weakness Moderate to severe bilateral proximal PCA stenoses. Mild right cavernous and supraclinoid ICA stenosis. She presents today for follow-up of her atrial fibrillation   Last seen by myself in clinic September 2022 Last seen by one of our providers September 2023 Atrial flutter noted on EKG, rate 130 bpm On Eliquis Metoprolol increased up to 12.5 mg in the morning, 20 5 in the PM continue   Seen by PMD 05/28/22: atrial fib/fluuter, rate 130s TSH 22 Given amiodarone load 400 twice daily for 1 week down to 200 twice daily She did not bring medication list with her today, reports she is taking Metoprolol 12.5 twice a day Was started on Lasix with potassium  Reports that her breathing feels better, Leg swelling has resolved Chronic gait instability Uses a cane and a cart at home  Reports blood pressure has been elevated at home typically [id number] systolic Blood pressure [id number] systolic on arrival today, [id number] systolic even on my recheck  Lost husband 10/2020 Was on hospice Married 50 years  Labs reviewed Total chol 157 Normal BMP  EKG personally reviewed by myself on todays visit Sinus bradycardia rate 52 bpm, no ST or T wave changes, rare junctional escape beats   Lab Results  Component Value Date   CHOL 125 05/21/2022   HDL 46.30 05/21/2022   LDLCALC 63 05/21/2022   TRIG 80.0 05/21/2022    Other past medical  hx Echo 05/2015: EF 55 to 60%  Neck CT  05/2015 IMPRESSION: 1. Moderate to severe bilateral proximal PCA stenoses. 2. Mild right cavernous and supraclinoid ICA stenosis.  Hospital records were reviewed from March 2017, right side deficits upper extremity, lower extremity, speech deficits TPA was not given CT scan of the head and neck documenting diffuse PAD Discharged with eliquis and aspirin 81 mg daily She has completed rehabilitation, has been home for approximately one month, Reports improving deficits, able to floss her teeth, brush her hair,  graduated to a cane from a walker Denies any tachycardia or palpitations concerning for arrhythmia   Notes indicate TEE with cardioversion 10/18/2014 which was unsuccessful    PMH:   has a past medical history of Acute heart failure ([place]), Anxiety, Arrhythmia, Arthritis, Atrial fibrillation ([place]), Basal cell carcinoma (11/19/2021), Heart murmur, Hyperlipemia, Hypertension, and Stroke ([place]).  PSH:    Past Surgical History:  Procedure Laterality Date   ABDOMINAL HYSTERECTOMY     APPENDECTOMY     BLADDER SUSPENSION     ELECTROPHYSIOLOGIC STUDY N/A 10/18/2014   Procedure: CARDIOVERSION;  Surgeon: Thayer Headings, MD;  Location: [facility] ORS;  Service: Cardiovascular;  Laterality: N/A;   TEE WITHOUT CARDIOVERSION N/A 10/18/2014   Procedure: TRANSESOPHAGEAL ECHOCARDIOGRAM (TEE);  Surgeon: Thayer Headings, MD;  Location: [facility] ORS;  Service: Cardiovascular;  Laterality: N/A;   TONSILLECTOMY     TUMOR REMOVAL     right arm    Current Outpatient Medications  Medication Sig Dispense Refill  amLODipine (NORVASC) 2.5 MG tablet TAKE 1 TABLET BY MOUTH EVERY DAY 90 tablet 1   apixaban (ELIQUIS) 5 MG TABS tablet TAKE 1 TABLET BY MOUTH TWICE A DAY. NEED LAB WORK FOR REFILL 180 tablet 2   atorvastatin (LIPITOR) 40 MG tablet TAKE 1 TABLET BY MOUTH DAILY AT 6 PM. 90 tablet 0   Cholecalciferol 1000 UNITS capsule Take 2,000 Units by mouth daily.       CINNAMON PO Take 1 tablet by mouth daily.     Coenzyme Q10-Fish Oil-Vit E (CO-Q 10 OMEGA-3 FISH OIL) CAPS Take 1 capsule by mouth 2 (two) times daily.     conjugated estrogens (PREMARIN) vaginal cream Apply 0.'5mg'$  (pea-sized amount)  just inside the vaginal introitus with a finger-tip on  Monday, Wednesday and Friday nights. 30 g 12   CRANBERRY PO Take 1 tablet by mouth 2 (two) times daily.      ezetimibe (ZETIA) 10 MG tablet TAKE 1 TABLET BY MOUTH EVERY DAY 90 tablet 2   levothyroxine (SYNTHROID) 25 MCG tablet Take 1 tablet (25 mcg total) by mouth daily. 90 tablet 3   lisinopril (ZESTRIL) 20 MG tablet Take 1 tablet (20 mg total) by mouth daily. 90 tablet 3   LYSINE PO Take by mouth daily.     MISC NATURAL PRODUCTS PO Take 1 tablet by mouth daily. Multivitamin-Dr. Talmage Coin     mupirocin cream (BACTROBAN) 2 % Apply 1 application topically 2 (two) times daily. 15 g 0   NON FORMULARY Cell Rebuild Takes 1 tablet BID.     NON FORMULARY Canaflex 1 tablet daily.     NON FORMULARY Epivery Takes 1 tablet bid.     NON FORMULARY Limbex take one tablet daily     NON FORMULARY Sanguenol nitric oxide one tablet daily     NON FORMULARY Reprieve one tablet daily     OVER THE COUNTER MEDICATION Take 1,040 mg by mouth 2 (two) times daily. Dandelion BID     TURMERIC PO Take by mouth 2 (two) times daily.     amiodarone (PACERONE) 200 MG tablet Take 1 tablet (200 mg total) by mouth daily. 90 tablet 0   furosemide (LASIX) 20 MG tablet Take 1 tablet (20 mg total) by mouth every Monday, Wednesday, and Friday. 12 tablet 1   metoprolol tartrate (LOPRESSOR) 25 MG tablet Take 0.5 tablets (12.5 mg total) by mouth 2 (two) times daily. 90 tablet 0   potassium chloride SA (KLOR-CON M) 20 MEQ tablet Take 1 tablet (20 mEq total) by mouth every Monday, Wednesday, and Friday. 12 tablet 1   No current facility-administered medications for this visit.    Allergies:   Ciprofloxacin, Ciprofloxacin, Hydrochlorothiazide,  Hydrochlorothiazide w-triamterene, Sulfa antibiotics, Sulfonamide derivatives, Valsartan, and Valsartan   Social History:  The patient  reports that she has never smoked. She has never used smokeless tobacco. She reports that she does not drink alcohol and does not use drugs.   Family History:   family history includes Cancer in her father; Diabetes in her maternal aunt; Heart disease in her mother; Osteoporosis in her mother; Stroke in her mother.    Review of Systems: Review of Systems  Constitutional:  Positive for malaise/fatigue.  HENT: Negative.    Respiratory: Negative.    Cardiovascular: Negative.   Gastrointestinal: Negative.   Musculoskeletal: Negative.   Neurological: Negative.   Psychiatric/Behavioral: Negative.    All other systems reviewed and are negative.  PHYSICAL EXAM: VS:  BP (!) 170/80 (BP Location: Left  Arm)   Pulse (!) 52   Ht '5\' 4"'$  (1.626 m)   Wt 108 lb 4 oz (49.1 kg)   LMP  (LMP Unknown)   SpO2 97%   BMI 18.58 kg/m  , BMI Body mass index is 18.58 kg/m. Constitutional:  oriented to person, place, and time. No distress.  HENT:  Head: Grossly normal Eyes:  no discharge. No scleral icterus.  Neck: No JVD, no carotid bruits  Cardiovascular: Regular rate and rhythm, no murmurs appreciated Pulmonary/Chest: Clear to auscultation bilaterally, no wheezes or rails Abdominal: Soft.  no distension.  no tenderness.  Musculoskeletal: Normal range of motion Neurological:  normal muscle tone. Coordination normal. No atrophy Skin: Skin warm and dry Psychiatric: normal affect, pleasant   Recent Labs: 05/21/2022: BUN 24; Creatinine, Ser 1.02; Potassium 3.8; Sodium 139 05/28/2022: ALT 95; Hemoglobin 14.8; Platelets 168.0; Pro B Natriuretic peptide (BNP) 1,300.0; TSH 22.29    Lipid Panel Lab Results  Component Value Date   CHOL 125 05/21/2022   HDL 46.30 05/21/2022   LDLCALC 63 05/21/2022   TRIG 80.0 05/21/2022    Wt Readings from Last 3 Encounters:  06/12/22  108 lb 4 oz (49.1 kg)  05/28/22 122 lb 4 oz (55.5 kg)  05/21/22 116 lb (52.6 kg)      ASSESSMENT AND PLAN:  Paroxysmal atrial fibrillation ([place]) - Plan: EKG 12-Lead Converting to normal sinus rhythm after amiodarone load Some medication confusion, did not bring a list with her Recommend she decrease amiodarone down to 200 mg daily from twice daily Recommend she continue metoprolol 12.5 twice daily She reports that she is currently taking Eliquis 2.5 twice daily New prescription for lower dose Eliquis will be sent in given 82th birthday is fast approaching and weight is less than 60 kg with additional weight loss over the past month or 2, significant gait instability  Leg edema In the setting of atrial fibrillation with RVR, likely CHF Improved on Lasix 20 daily Now back in normal rhythm, recommended she decrease Lasix and potassium down to 3 days a week Monday Wednesday Friday  HTN Markedly elevated blood pressure today, reports it is chronically elevated at home [id number] systolic Allergy list indicating side effect to ARBs, possible angioedema, will also avoid ACE inhibitors Prescription for doxazosin/Cardura 1 mg daily to be taken in the afternoon sent in Continue amlodipine 2.5 in the morning.  Will avoid higher dose amlodipine given recent leg swelling Given med confusion we will try to keep her list simple  HYPERLIPIDEMIA Cholesterol is at goal on the current lipid regimen. No changes to the medications were made.  Cerebrovascular accident (CVA) due to thrombosis of left middle cerebral artery (HCC) Prior history of stroke, paroxysmal atrial fibrillation Known cerebrovascular disease Reduced dose Eliquis 2.5 twice daily age, weight  Gait instability Sedentary, legs weak High fall risk, recommended walker at home Recommended med alert button at home when son is not home She reports he is gone all this week  Adjustment disorder Loss of husband 2022, lives with son  Long  discussion concerning atrial fibrillation, medications, heart failure symptoms  Total encounter time more than 40 minutes  Greater than 50% was spent in counseling and coordination of care with the patient   Orders Placed This Encounter  Procedures   EKG 12-Lead     Signed, Esmond Plants, M.D., Ph.D. 06/12/2022  [place], [place]

## 2022-06-12 NOTE — Telephone Encounter (Signed)
Spoke with patient and advised her of medication change from lisinopril to doxazosin. Waited on phone while patient crossed it off of the AVS given to her today. Patient expressed verbal understanding and states she just picked the doxazosin Rx up from her pharmacy.

## 2022-06-12 NOTE — Telephone Encounter (Signed)
Patient returned CMA's call. 

## 2022-06-12 NOTE — Telephone Encounter (Signed)
Left detailed message (per DPR) regarding medication change. Advised patient to call back if she has any questions.

## 2022-06-12 NOTE — Patient Instructions (Addendum)
Medication Instructions:  Please decrease the amiodarone down to 200 mg daily Continue metoprolol 1/2 pill twice a day Lasix and potassium 3 days a week (Monday/Wednesday/Friday)  Please start Cardura/doxazosin 1 mg in the evening  If you need a refill on your cardiac medications before your next appointment, please call your pharmacy.   Lab work: No new labs needed  Testing/Procedures: No new testing needed  Follow-Up: At [facility], you and your health needs are our priority.  As part of our continuing mission to provide you with exceptional heart care, we have created designated Provider Care Teams.  These Care Teams include your primary Cardiologist (physician) and Advanced Practice Providers (APPs -  Physician Assistants and Nurse Practitioners) who all work together to provide you with the care you need, when you need it.  You will need a follow up appointment in 2 months  Providers on your designated Care Team:   Murray Hodgkins, NP Christell Faith, PA-C Cadence Kathlen Mody, [place]  COVID-19 Vaccine Information can be found at: ShippingScam.co.uk For questions related to vaccine distribution or appointments, please email [email or web address] or call [phone].

## 2022-06-12 NOTE — Telephone Encounter (Signed)
Spoke with Lennette Bihari at [facility] and advised him to cancel lisinopril Rx and that doxazosin was being sent in instead per Dr .Ida Rogue d/t reaction (angioedema) caused by valsartan.

## 2022-06-17 ENCOUNTER — Telehealth: Payer: Self-pay | Admitting: Cardiovascular Disease

## 2022-06-17 NOTE — Telephone Encounter (Signed)
Spoke with pharmacist and he states that per insurance will not cover until next week. He did place order for 8 tablets to cover her until the next refill date and it would cost her $7.87. Advised to fill it and I will call patient back to discuss this with them.

## 2022-06-17 NOTE — Telephone Encounter (Signed)
Pt c/o medication issue:  1. Name of Medication: amiodarone (PACERONE) 200 MG tablet   2. How are you currently taking this medication (dosage and times per day)?   3. Are you having a reaction (difficulty breathing--STAT)?   4. What is your medication issue? Pt's son is calling to say insurance will not cover currently and they needs options. Please advise.

## 2022-06-17 NOTE — Telephone Encounter (Signed)
Spoke with patients son and advised that they can fill her 8 pills worth to last until her next fill date. Advised it would cost $7.87 and they are filling it now. He was very appreciative for the help with no further questions at this time.

## 2022-06-17 NOTE — Telephone Encounter (Signed)
Spoke with patients son and he stated insurance would not cover it until later this month. Advised that I would call pharmacy to see what I can do.

## 2022-07-05 ENCOUNTER — Other Ambulatory Visit (INDEPENDENT_AMBULATORY_CARE_PROVIDER_SITE_OTHER): Payer: Medicare Other

## 2022-07-05 DIAGNOSIS — E038 Other specified hypothyroidism: Secondary | ICD-10-CM | POA: Diagnosis not present

## 2022-07-05 DIAGNOSIS — I5022 Chronic systolic (congestive) heart failure: Secondary | ICD-10-CM | POA: Diagnosis not present

## 2022-07-05 LAB — BASIC METABOLIC PANEL
BUN: 22 mg/dL (ref 6–23)
CO2: 28 mEq/L (ref 19–32)
Calcium: 9.6 mg/dL (ref 8.4–10.5)
Chloride: 106 mEq/L (ref 96–112)
Creatinine, Ser: 0.94 mg/dL (ref 0.40–1.20)
GFR: 57.62 mL/min — ABNORMAL LOW (ref >60.00)
Glucose, Bld: 64 mg/dL — ABNORMAL LOW (ref 70–99)
Potassium: 4.1 mEq/L (ref 3.5–5.1)
Sodium: 141 mEq/L (ref 135–145)

## 2022-07-05 LAB — T3, FREE: T3, Free: 2.6 pg/mL (ref 2.3–4.2)

## 2022-07-05 LAB — T4, FREE: Free T4: 0.66 ng/dL (ref 0.60–1.60)

## 2022-07-05 LAB — TSH: TSH: 8.92 u[IU]/mL — ABNORMAL HIGH (ref 0.35–5.50)

## 2022-07-09 MED ORDER — LEVOTHYROXINE SODIUM 50 MCG PO TABS: 50.0000 ug | ORAL_TABLET | Freq: Every day | ORAL | 11 refills | Status: DC

## 2022-07-09 NOTE — Addendum Note (Signed)
Addended by: Kerby Nora E on: 07/09/2022 09:05 AM   Modules accepted: Orders

## 2022-07-09 NOTE — Progress Notes (Signed)
Let patient know levothyroxine 50 mcg was sent to her pharmacy to start.  In 4 to 6 weeks we will recheck at the lab appointment already scheduled.

## 2022-07-30 ENCOUNTER — Other Ambulatory Visit: Payer: Self-pay | Admitting: Cardiovascular Disease

## 2022-07-30 ENCOUNTER — Other Ambulatory Visit: Payer: Self-pay | Admitting: Cardiology

## 2022-07-31 ENCOUNTER — Other Ambulatory Visit: Payer: Self-pay | Admitting: Cardiovascular Disease

## 2022-08-01 ENCOUNTER — Ambulatory Visit: Payer: Medicare Other | Admitting: Urology

## 2022-08-06 NOTE — Progress Notes (Signed)
04/23/2018 2:42 PM   Karen Bowman 1941/01/25 [id number]  Referring provider: Excell Seltzer, MD [address] [place],  [place] [address]  Chief Complaint  Patient presents with   Over Active Bladder   Urological history: 1. OAB wet -Contributing factors of age, vaginal atrophy, cystocele, stroke, IBS, arthritis, hypertension, anxiety and lymphedema  2. Cystocele -s/p cystocele repair by GYN in early 1990's for symptomatic prolapse following benign cyst many years prior  HPI: Karen Bowman is a 82 y.o. female who presents today for a yearly follow up.   She is having 1-7 daytime voids, 3 or more episodes of nocturia with a mild to strong urge to urinate.  She has urge incontinence.  She wears depends daily more so for security.  She goes through 1-2 depends daily.  She does engage in toilet mapping.  Patient denies any modifying or aggravating factors.  Patient denies any gross hematuria, dysuria or suprapubic/flank pain.  Patient denies any fevers, chills, nausea or vomiting.    She states that her symptoms are stable.  She has no complaints at this time.   PMH: Past Medical History:  Diagnosis Date   Acute heart failure (HCC)    Anxiety    Arrhythmia    Arthritis    Atrial fibrillation (HCC)    Basal cell carcinoma 11/19/2021   left forehead above brow - ED&C   Heart murmur    Hyperlipemia    Hypertension    Stroke [facility])     Surgical History: Past Surgical History:  Procedure Laterality Date   ABDOMINAL HYSTERECTOMY     APPENDECTOMY     BLADDER SUSPENSION     ELECTROPHYSIOLOGIC STUDY N/A 10/18/2014   Procedure: CARDIOVERSION;  Surgeon: Vesta Mixer, MD;  Location: [facility] ORS;  Service: Cardiovascular;  Laterality: N/A;   TEE WITHOUT CARDIOVERSION N/A 10/18/2014   Procedure: TRANSESOPHAGEAL ECHOCARDIOGRAM (TEE);  Surgeon: Vesta Mixer, MD;  Location: [facility] ORS;  Service: Cardiovascular;  Laterality: N/A;   TONSILLECTOMY     TUMOR REMOVAL      right arm    Home Medications:  Allergies as of 08/07/2022       Reactions   Ciprofloxacin    REACTION: dizzy   Ciprofloxacin Nausea And Vomiting   Hydrochlorothiazide Nausea And Vomiting   Hydrochlorothiazide W-triamterene    REACTION: nausea   Sulfa Antibiotics Nausea And Vomiting   Sulfonamide Derivatives    REACTION: nausea \\T \ vomiting   Valsartan    REACTION: angioedema - probable   Valsartan Nausea And Vomiting        Medication List        Accurate as of Aug 07, 2022  2:42 PM. If you have any questions, ask your nurse or doctor.          amiodarone 200 MG tablet Commonly known as: PACERONE Take 1 tablet (200 mg total) by mouth daily.   amLODipine 2.5 MG tablet Commonly known as: NORVASC TAKE 1 TABLET BY MOUTH EVERY DAY   apixaban 2.5 MG Tabs tablet Commonly known as: Eliquis Take 1 tablet (2.5 mg total) by mouth 2 (two) times daily.   atorvastatin 40 MG tablet Commonly known as: LIPITOR TAKE 1 TABLET BY MOUTH DAILY AT 6 PM.   Cholecalciferol 25 MCG (1000 UT) capsule Take 2,000 Units by mouth daily.   CINNAMON PO Take 1 tablet by mouth daily.   CO-Q 10 Omega-3 Fish Oil Caps Take 1 capsule by mouth 2 (two) times daily.  CRANBERRY PO Take 1 tablet by mouth 2 (two) times daily.   doxazosin 1 MG tablet Commonly known as: CARDURA TAKE 1 TABLET BY MOUTH EVERY DAY IN THE EVENING   ezetimibe 10 MG tablet Commonly known as: ZETIA TAKE 1 TABLET BY MOUTH EVERY DAY   furosemide 20 MG tablet Commonly known as: LASIX Take 1 tablet (20 mg total) by mouth every Monday, Wednesday, and Friday.   levothyroxine 50 MCG tablet Commonly known as: SYNTHROID Take 1 tablet (50 mcg total) by mouth daily.   LYSINE PO Take by mouth daily.   metoprolol tartrate 25 MG tablet Commonly known as: LOPRESSOR Take 0.5 tablets (12.5 mg total) by mouth 2 (two) times daily.   MISC NATURAL PRODUCTS PO Take 1 tablet by mouth daily. Multivitamin-Dr. Mel Almond    mupirocin cream 2 % Commonly known as: Bactroban Apply 1 application topically 2 (two) times daily.   NON FORMULARY Cell Rebuild Takes 1 tablet BID.   NON FORMULARY Canaflex 1 tablet daily.   NON FORMULARY Epivery Takes 1 tablet bid.   NON FORMULARY Limbex take one tablet daily   NON FORMULARY Sanguenol nitric oxide one tablet daily   NON FORMULARY Reprieve one tablet daily   OVER THE COUNTER MEDICATION Take 1,040 mg by mouth 2 (two) times daily. Dandelion BID   potassium chloride SA 20 MEQ tablet Commonly known as: KLOR-CON M Take 1 tablet (20 mEq total) by mouth every Monday, Wednesday, and Friday.   Premarin vaginal cream Generic drug: conjugated estrogens Apply 0.5mg  (pea-sized amount)  just inside the vaginal introitus with a finger-tip on  Monday, Wednesday and Friday nights.   TURMERIC PO Take by mouth 2 (two) times daily.        Allergies:  Allergies  Allergen Reactions   Ciprofloxacin     REACTION: dizzy   Ciprofloxacin Nausea And Vomiting   Hydrochlorothiazide Nausea And Vomiting   Hydrochlorothiazide W-Triamterene     REACTION: nausea   Sulfa Antibiotics Nausea And Vomiting   Sulfonamide Derivatives     REACTION: nausea \\T \ vomiting   Valsartan     REACTION: angioedema - probable   Valsartan Nausea And Vomiting    Family History: Family History  Problem Relation Age of Onset   Osteoporosis Mother    Heart disease Mother    Stroke Mother    Cancer Father    Diabetes Maternal Aunt     Social History:  reports that she has never smoked. She has never used smokeless tobacco. She reports that she does not drink alcohol and does not use drugs.  ROS: For pertinent review of systems please refer to history of present illness  Physical Exam: BP (!) 119/51   Pulse (!) 57   Ht 5\' 4"  (1.626 m)   Wt 116 lb (52.6 kg)   LMP  (LMP Unknown)   BMI 19.91 kg/m   Constitutional:  Well nourished. Alert and oriented, No acute distress. HEENT: [place]  AT, moist mucus membranes.  Trachea midline, no masses. Cardiovascular: No clubbing, cyanosis, or edema. Respiratory: Normal respiratory effort, no increased work of breathing. Neurologic: Grossly intact, no focal deficits, moving all 4 extremities. Psychiatric: Normal mood and affect.    Laboratory Data: Results for orders placed or performed in visit on 07/05/22  Basic metabolic panel  Result Value Ref Range   Sodium 141 135 - 145 mEq/L   Potassium 4.1 3.5 - 5.1 mEq/L   Chloride 106 96 - 112 mEq/L   CO2 28 19 -  32 mEq/L   Glucose, Bld 64 (L) 70 - 99 mg/dL   BUN 22 6 - 23 mg/dL   Creatinine, Ser 1.61 0.40 - 1.20 mg/dL   GFR 09.60 (L) >45.40 mL/min   Calcium 9.6 8.4 - 10.5 mg/dL  TSH  Result Value Ref Range   TSH 8.92 (H) 0.35 - 5.50 uIU/mL  T3, free  Result Value Ref Range   T3, Free 2.6 2.3 - 4.2 pg/mL  T4, free  Result Value Ref Range   Free T4 0.66 0.60 - 1.60 ng/dL  I have reviewed the labs.  Pertinent imaging: No recent urological imaging  Assessment & Plan:    1. OAB wet -not bothersome  2. Cystocele -No bothersome incontinence symptoms at this time -managing with incontinence pads  3.  Vaginal atrophy -Continue vaginal estrogen cream 3 nights weekly -Refill sent to pharmacy  Return in about 1 year (around 08/07/2023) for OAB questionnaire.  These notes generated with voice recognition software. I apologize for typographical errors.  Cloretta Ned  [facility] [address] [place], [place] [address] [phone]

## 2022-08-07 ENCOUNTER — Ambulatory Visit: Payer: Medicare Other | Admitting: Urology

## 2022-08-07 ENCOUNTER — Encounter: Payer: Self-pay | Admitting: Urology

## 2022-08-07 VITALS — BP 119/51 | HR 57 | Ht 64.0 in | Wt 116.0 lb

## 2022-08-07 DIAGNOSIS — N3281 Overactive bladder: Secondary | ICD-10-CM | POA: Diagnosis not present

## 2022-08-07 DIAGNOSIS — N952 Postmenopausal atrophic vaginitis: Secondary | ICD-10-CM

## 2022-08-07 DIAGNOSIS — N8111 Cystocele, midline: Secondary | ICD-10-CM

## 2022-08-07 MED ORDER — PREMARIN 0.625 MG/GM VA CREA
TOPICAL_CREAM | VAGINAL | 12 refills | Status: DC
Start: 2022-08-07 — End: 2023-08-07

## 2022-08-18 NOTE — Progress Notes (Unsigned)
Cardiology Office Note  Date:  08/20/2022   ID:  Karen Bowman, DOB May 10, 1940, MRN [id number]  PCP:  Excell Seltzer, MD   Chief Complaint  Patient presents with   2 month follow up     Patient c/o dizziness at times. Medications reviewed by the patient verbally.     HPI:  82 year old woman with history of  paroxysmal atrial fibrillation,  hospitalization July 5 with discharge 10/20/2014 for atrial fibrillation,  discharged on amiodarone, anticoagulation, digoxin, metoprolol.  hyperlipidemia stroke March 2017, still right leg weakness Moderate to severe bilateral proximal PCA stenoses. Mild right cavernous and supraclinoid ICA stenosis. She presents today for follow-up of her atrial fibrillation /atrial flutter  Last seen by myself in clinic March 24 Was in sinus bradycardia Eliqus 2.5 bid Amio 200 daily  In follow-up today reports having high blood pressure, Some fatigue Heart rate low today rate 47 bpm Remains on metoprolol tartrate 12.5 twice daily with amiodarone 200 daily Denies any tachypalpitations concerning for atrial fibrillation  BP elevated at home 150 systolic BP with urology at office visit 119/70  Waking a lot at night, every 2 hours  Sedentary, gait instability Rests in chair, watches TV  Lost husband 10/2020 Was on hospice Married 50 years  Mily orthostatic today 190 systolic supine, down to 153 with standing  EKG personally reviewed by myself on todays visit Sinus bradycardia rate 47 bpm no significant ST-T wave changes  Other past medical history reviewed Seen by PMD 05/28/22: atrial fib/fluuter, rate 130s TSH 22 Given amiodarone load 400 twice daily for 1 week down to 200 twice daily    Lab Results  Component Value Date   CHOL 125 05/21/2022   HDL 46.30 05/21/2022   LDLCALC 63 05/21/2022   TRIG 80.0 05/21/2022    Other past medical hx Echo 05/2015: EF 55 to 60%  Neck CT  05/2015 IMPRESSION: 1. Moderate to severe bilateral  proximal PCA stenoses. 2. Mild right cavernous and supraclinoid ICA stenosis.  Hospital records were reviewed from March 2017, right side deficits upper extremity, lower extremity, speech deficits TPA was not given CT scan of the head and neck documenting diffuse PAD Discharged with eliquis and aspirin 81 mg daily She has completed rehabilitation, has been home for approximately one month, Reports improving deficits, able to floss her teeth, brush her hair,  graduated to a cane from a walker Denies any tachycardia or palpitations concerning for arrhythmia   Notes indicate TEE with cardioversion 10/18/2014 which was unsuccessful    PMH:   has a past medical history of Acute heart failure (HCC), Anxiety, Arrhythmia, Arthritis, Atrial fibrillation (HCC), Basal cell carcinoma (11/19/2021), Heart murmur, Hyperlipemia, Hypertension, and Stroke (HCC).  PSH:    Past Surgical History:  Procedure Laterality Date   ABDOMINAL HYSTERECTOMY     APPENDECTOMY     BLADDER SUSPENSION     ELECTROPHYSIOLOGIC STUDY N/A 10/18/2014   Procedure: CARDIOVERSION;  Surgeon: Vesta Mixer, MD;  Location: [facility] ORS;  Service: Cardiovascular;  Laterality: N/A;   TEE WITHOUT CARDIOVERSION N/A 10/18/2014   Procedure: TRANSESOPHAGEAL ECHOCARDIOGRAM (TEE);  Surgeon: Vesta Mixer, MD;  Location: [facility] ORS;  Service: Cardiovascular;  Laterality: N/A;   TONSILLECTOMY     TUMOR REMOVAL     right arm    Current Outpatient Medications  Medication Sig Dispense Refill   amiodarone (PACERONE) 200 MG tablet Take 1 tablet (200 mg total) by mouth daily. 90 tablet 0   amLODipine (NORVASC) 2.5 MG tablet  TAKE 1 TABLET BY MOUTH EVERY DAY 90 tablet 1   apixaban (ELIQUIS) 2.5 MG TABS tablet Take 1 tablet (2.5 mg total) by mouth 2 (two) times daily. 180 tablet 3   atorvastatin (LIPITOR) 40 MG tablet TAKE 1 TABLET BY MOUTH DAILY AT 6 PM. 90 tablet 0   Cholecalciferol 1000 UNITS capsule Take 2,000 Units by mouth daily.       CINNAMON PO Take 1 tablet by mouth daily.     Coenzyme Q10-Fish Oil-Vit E (CO-Q 10 OMEGA-3 FISH OIL) CAPS Take 1 capsule by mouth 2 (two) times daily.     conjugated estrogens (PREMARIN) vaginal cream Apply 0.5mg  (pea-sized amount)  just inside the vaginal introitus with a finger-tip on  Monday, Wednesday and Friday nights. 30 g 12   CRANBERRY PO Take 1 tablet by mouth 2 (two) times daily.      doxazosin (CARDURA) 1 MG tablet TAKE 1 TABLET BY MOUTH EVERY DAY IN THE EVENING 30 tablet 0   ezetimibe (ZETIA) 10 MG tablet TAKE 1 TABLET BY MOUTH EVERY DAY 90 tablet 0   furosemide (LASIX) 20 MG tablet Take 1 tablet (20 mg total) by mouth every Monday, Wednesday, and Friday. 12 tablet 1   levothyroxine (SYNTHROID) 50 MCG tablet Take 1 tablet (50 mcg total) by mouth daily. 30 tablet 11   LYSINE PO Take by mouth daily.     metoprolol tartrate (LOPRESSOR) 25 MG tablet Take 0.5 tablets (12.5 mg total) by mouth 2 (two) times daily. 90 tablet 0   MISC NATURAL PRODUCTS PO Take 1 tablet by mouth daily. Multivitamin-Dr. Mel Almond     mupirocin cream (BACTROBAN) 2 % Apply 1 application topically 2 (two) times daily. 15 g 0   NON FORMULARY Cell Rebuild Takes 1 tablet BID.     NON FORMULARY Canaflex 1 tablet daily.     NON FORMULARY Epivery Takes 1 tablet bid.     NON FORMULARY Limbex take one tablet daily     NON FORMULARY Sanguenol nitric oxide one tablet daily     NON FORMULARY Reprieve one tablet daily     OVER THE COUNTER MEDICATION Take 1,040 mg by mouth 2 (two) times daily. Dandelion BID     potassium chloride SA (KLOR-CON M) 20 MEQ tablet Take 1 tablet (20 mEq total) by mouth every Monday, Wednesday, and Friday. 12 tablet 1   TURMERIC PO Take by mouth 2 (two) times daily.     No current facility-administered medications for this visit.    Allergies:   Ciprofloxacin, Ciprofloxacin, Hydrochlorothiazide, Hydrochlorothiazide w-triamterene, Sulfa antibiotics, Sulfonamide derivatives, Valsartan, and Valsartan    Social History:  The patient  reports that she has never smoked. She has never used smokeless tobacco. She reports that she does not drink alcohol and does not use drugs.   Family History:   family history includes Cancer in her father; Diabetes in her maternal aunt; Heart disease in her mother; Osteoporosis in her mother; Stroke in her mother.    Review of Systems: Review of Systems  Constitutional:  Positive for malaise/fatigue.  HENT: Negative.    Respiratory: Negative.    Cardiovascular: Negative.   Gastrointestinal: Negative.   Musculoskeletal: Negative.   Neurological: Negative.   Psychiatric/Behavioral: Negative.    All other systems reviewed and are negative.  PHYSICAL EXAM: VS:  BP (!) 186/67 (BP Location: Left Arm, Patient Position: Sitting, Cuff Size: Normal)   Pulse (!) 47   Ht 5\' 4"  (1.626 m)   Wt 116 lb  6 oz (52.8 kg)   LMP  (LMP Unknown)   SpO2 96%   BMI 19.98 kg/m  , BMI Body mass index is 19.98 kg/m. Constitutional:  oriented to person, place, and time. No distress.  HENT:  Head: Grossly normal Eyes:  no discharge. No scleral icterus.  Neck: No JVD, no carotid bruits  Cardiovascular: Bradycardic, regular no murmurs appreciated Pulmonary/Chest: Clear to auscultation bilaterally, no wheezes or rails Abdominal: Soft.  no distension.  no tenderness.  Musculoskeletal: Normal range of motion Neurological:  normal muscle tone. Coordination normal. No atrophy Skin: Skin warm and dry Psychiatric: normal affect, pleasant  Recent Labs: 05/28/2022: ALT 95; Hemoglobin 14.8; Platelets 168.0; Pro B Natriuretic peptide (BNP) 1,300.0 07/05/2022: BUN 22; Creatinine, Ser 0.94; Potassium 4.1; Sodium 141; TSH 8.92    Lipid Panel Lab Results  Component Value Date   CHOL 125 05/21/2022   HDL 46.30 05/21/2022   LDLCALC 63 05/21/2022   TRIG 80.0 05/21/2022    Wt Readings from Last 3 Encounters:  08/20/22 116 lb 6 oz (52.8 kg)  08/07/22 116 lb (52.6 kg)  06/12/22 108  lb 4 oz (49.1 kg)      ASSESSMENT AND PLAN:  Paroxysmal atrial fibrillation (HCC) - Plan: EKG 12-Lead Maintaining normal sinus rhythm, now with sinus bradycardia Recommend she decrease amiodarone down to 100 mg daily, Hold metoprolol tartrate and change to metoprolol succinate 12.5 daily  Leg edema Minimal leg edema on today's visit On lasix 20 mg three days a week  HTN BP elevated,  Increase cardura up to 1 mg twice a day Amlodipine 2.5 daily, metoprolol succinate 12.5 daily Will refrain from higher dose amlodipine given history of leg swelling Notes indicating ACE inhibitor angioedema, will avoid ARB's  HYPERLIPIDEMIA Cholesterol is at goal on the current lipid regimen. No changes to the medications were made.  Cerebrovascular accident (CVA) due to thrombosis of left middle cerebral artery (HCC) Prior history of stroke, paroxysmal atrial fibrillation Known cerebrovascular disease Reduced dose Eliquis 2.5 twice daily age, weight  Gait instability Sedentary, legs weak High fall risk, recommended walker at home Likely needs assistance at home, difficulty transitioning from chair to exam table without assistance  Adjustment disorder Loss of husband 2022, lives with son   Total encounter time more than 30 minutes  Greater than 50% was spent in counseling and coordination of care with the patient   No orders of the defined types were placed in this encounter.    Signed, Dossie Arbour, M.D., Ph.D. 08/20/2022  [facility] [place], [place] [phone]

## 2022-08-20 ENCOUNTER — Ambulatory Visit: Payer: Medicare Other | Attending: Cardiovascular Disease | Admitting: Cardiovascular Disease

## 2022-08-20 ENCOUNTER — Encounter: Payer: Self-pay | Admitting: Cardiovascular Disease

## 2022-08-20 VITALS — BP 186/67 | HR 47 | Ht 64.0 in | Wt 116.4 lb

## 2022-08-20 DIAGNOSIS — E782 Mixed hyperlipidemia: Secondary | ICD-10-CM

## 2022-08-20 DIAGNOSIS — I5022 Chronic systolic (congestive) heart failure: Secondary | ICD-10-CM

## 2022-08-20 DIAGNOSIS — I480 Paroxysmal atrial fibrillation: Secondary | ICD-10-CM | POA: Diagnosis not present

## 2022-08-20 DIAGNOSIS — I10 Essential (primary) hypertension: Secondary | ICD-10-CM

## 2022-08-20 DIAGNOSIS — I498 Other specified cardiac arrhythmias: Secondary | ICD-10-CM

## 2022-08-20 DIAGNOSIS — I63312 Cerebral infarction due to thrombosis of left middle cerebral artery: Secondary | ICD-10-CM | POA: Diagnosis not present

## 2022-08-20 DIAGNOSIS — I4892 Unspecified atrial flutter: Secondary | ICD-10-CM

## 2022-08-20 MED ORDER — METOPROLOL SUCCINATE ER 25 MG PO TB24: 12.5000 mg | ORAL_TABLET | Freq: Every day | ORAL | 3 refills | Status: DC

## 2022-08-20 MED ORDER — AMIODARONE HCL 100 MG PO TABS: 100.0000 mg | ORAL_TABLET | Freq: Every day | ORAL | 3 refills | Status: DC

## 2022-08-20 MED ORDER — DOXAZOSIN MESYLATE 1 MG PO TABS: ORAL_TABLET | ORAL | 3 refills | Status: DC

## 2022-08-20 NOTE — Patient Instructions (Addendum)
Please monitor blood pressure at home Call with numbers   Medication Instructions:  Please decrease the amiodarone down to 100 mg once a day (1/2 of the 200 mg pill) Stop the metoprolol tartrate Start metoprolol succinate 25 mg pill 1/2 pill once a day  Please increase the cardura/doxazosin up to 1 mg twice a day  If you need a refill on your cardiac medications before your next appointment, please call your pharmacy.   Lab work: No new labs needed  Testing/Procedures: No new testing needed  Follow-Up: At [facility], you and your health needs are our priority.  As part of our continuing mission to provide you with exceptional heart care, we have created designated Provider Care Teams.  These Care Teams include your primary Cardiologist (physician) and Advanced Practice Providers (APPs -  Physician Assistants and Nurse Practitioners) who all work together to provide you with the care you need, when you need it.  You will need a follow up appointment in 6 months  Providers on your designated Care Team:   Nicolasa Ducking, NP Eula Listen, PA-C Cadence Fransico [place], [place]  COVID-19 Vaccine Information can be found at: [email or web address] For questions related to vaccine distribution or appointments, please email [email or web address] or call [phone].

## 2022-08-21 ENCOUNTER — Other Ambulatory Visit (INDEPENDENT_AMBULATORY_CARE_PROVIDER_SITE_OTHER): Payer: Medicare Other

## 2022-08-21 DIAGNOSIS — E038 Other specified hypothyroidism: Secondary | ICD-10-CM

## 2022-08-22 LAB — TSH: TSH: 4.22 u[IU]/mL (ref 0.35–5.50)

## 2022-08-22 LAB — T4, FREE: Free T4: 0.67 ng/dL (ref 0.60–1.60)

## 2022-08-22 LAB — T3, FREE: T3, Free: 2 pg/mL — ABNORMAL LOW (ref 2.3–4.2)

## 2022-08-26 ENCOUNTER — Other Ambulatory Visit: Payer: Self-pay | Admitting: Cardiovascular Disease

## 2022-09-15 ENCOUNTER — Other Ambulatory Visit: Payer: Self-pay | Admitting: Cardiovascular Disease

## 2022-09-23 ENCOUNTER — Other Ambulatory Visit: Payer: Self-pay | Admitting: Cardiovascular Disease

## 2022-09-26 ENCOUNTER — Other Ambulatory Visit: Payer: Self-pay | Admitting: Family Medicine

## 2022-10-14 DIAGNOSIS — Z7901 Long term (current) use of anticoagulants: Secondary | ICD-10-CM | POA: Diagnosis not present

## 2022-10-14 DIAGNOSIS — E039 Hypothyroidism, unspecified: Secondary | ICD-10-CM | POA: Diagnosis not present

## 2022-10-14 DIAGNOSIS — Z85828 Personal history of other malignant neoplasm of skin: Secondary | ICD-10-CM | POA: Diagnosis not present

## 2022-10-14 DIAGNOSIS — R32 Unspecified urinary incontinence: Secondary | ICD-10-CM | POA: Diagnosis not present

## 2022-10-14 DIAGNOSIS — Z791 Long term (current) use of non-steroidal anti-inflammatories (NSAID): Secondary | ICD-10-CM | POA: Diagnosis not present

## 2022-10-14 DIAGNOSIS — D6869 Other thrombophilia: Secondary | ICD-10-CM | POA: Diagnosis not present

## 2022-10-14 DIAGNOSIS — E785 Hyperlipidemia, unspecified: Secondary | ICD-10-CM | POA: Diagnosis not present

## 2022-10-14 DIAGNOSIS — M1990 Unspecified osteoarthritis, unspecified site: Secondary | ICD-10-CM | POA: Diagnosis not present

## 2022-10-25 ENCOUNTER — Other Ambulatory Visit: Payer: Self-pay | Admitting: Cardiovascular Disease

## 2022-10-26 ENCOUNTER — Other Ambulatory Visit: Payer: Self-pay | Admitting: Cardiology

## 2022-11-04 ENCOUNTER — Other Ambulatory Visit: Payer: Self-pay | Admitting: Cardiovascular Disease

## 2022-12-24 ENCOUNTER — Other Ambulatory Visit: Payer: Self-pay | Admitting: Cardiovascular Disease

## 2023-02-05 DIAGNOSIS — K080 Exfoliation of teeth due to systemic causes: Secondary | ICD-10-CM | POA: Diagnosis not present

## 2023-02-23 NOTE — Progress Notes (Unsigned)
Cardiology Office Note  Date:  02/24/2023   ID:  WAI NAJAR, DOB 1940/09/27, MRN [id number]  PCP:  Excell Seltzer, MD   Chief Complaint  Patient presents with   6 month follow up     Patient c/o tiredness lately, she took a fall last Monday night 02/17/2023, hit her head & has a knot and a bruise on her head, she didn't go to the hospital and has not had anybody check her head. She is also c/o dizziness. Medications reviewed by the patient verbally.     HPI:  82 year old woman with history of  paroxysmal atrial fibrillation,  hospitalization July 5 with discharge 10/20/2014 for atrial fibrillation,  discharged on amiodarone, anticoagulation, digoxin, metoprolol.  hyperlipidemia stroke March 2017, still right leg weakness Moderate to severe bilateral proximal PCA stenoses. Mild right cavernous and supraclinoid ICA stenosis. She presents today for follow-up of her atrial fibrillation /atrial flutter  Last seen by myself in clinic May 24  Last week reports that she Lost balance , hit head, Has been icing head, given she developed a large hematoma "Swelling has gone down" by icing dizzy last week for several days, better now, doing limited ADLS No neurologic episodes Reports that her son checked her head for her, he was home at the time She turned quickly lost her balance and went down  Long discussion today concerning potential need for additional workup including head CT scan " I am doing okay", getting better every day  Was in sinus bradycardia on last clinic visit Amiodarone dose and metoprolol dose decreased Heart rate well-controlled today, maintaining normal sinus rhythm Denies any tachypalpitations concerning for atrial fibrillation  Orthostatics checked today, blood pressure 129 up to 140 systolic, Orthostatics negative  Sedentary, watches TV, sits in chair Lives with son Lost husband 10/2020 Was on hospice Married 50 years  EKG personally reviewed by  myself on todays visit EKG Interpretation Date/Time:  Monday February 24 2023 14:22:43 EST Ventricular Rate:  59 PR Interval:  156 QRS Duration:  100 QT Interval:  498 QTC Calculation: 493 R Axis:   54  Text Interpretation: Sinus bradycardia Possible Left atrial enlargement Left ventricular hypertrophy ( Sokolow-Lyon , Cornell product ) Prolonged QT When compared with ECG of 26-Dec-2021 14:58, No significant change was found Confirmed by Julien Nordmann [phone]) on 02/24/2023 2:33:37 PM    Other past medical history reviewed Seen by PMD 05/28/22: atrial fib/fluuter, rate 130s TSH 22 Given amiodarone load 400 twice daily for 1 week down to 200 twice daily   Lab Results  Component Value Date   CHOL 125 05/21/2022   HDL 46.30 05/21/2022   LDLCALC 63 05/21/2022   TRIG 80.0 05/21/2022    Other past medical hx Echo 05/2015: EF 55 to 60%  Neck CT  05/2015 IMPRESSION: 1. Moderate to severe bilateral proximal PCA stenoses. 2. Mild right cavernous and supraclinoid ICA stenosis.  Hospital records were reviewed from March 2017, right side deficits upper extremity, lower extremity, speech deficits TPA was not given CT scan of the head and neck documenting diffuse PAD Discharged with eliquis and aspirin 81 mg daily She has completed rehabilitation, has been home for approximately one month, Reports improving deficits, able to floss her teeth, brush her hair,  graduated to a cane from a walker Denies any tachycardia or palpitations concerning for arrhythmia   Notes indicate TEE with cardioversion 10/18/2014 which was unsuccessful    PMH:   has a past medical history of Acute heart  failure (HCC), Anxiety, Arrhythmia, Arthritis, Atrial fibrillation (HCC), Basal cell carcinoma (11/19/2021), Heart murmur, Hyperlipemia, Hypertension, and Stroke (HCC).  PSH:    Past Surgical History:  Procedure Laterality Date   ABDOMINAL HYSTERECTOMY     APPENDECTOMY     BLADDER SUSPENSION      ELECTROPHYSIOLOGIC STUDY N/A 10/18/2014   Procedure: CARDIOVERSION;  Surgeon: Vesta Mixer, MD;  Location: [facility] ORS;  Service: Cardiovascular;  Laterality: N/A;   TEE WITHOUT CARDIOVERSION N/A 10/18/2014   Procedure: TRANSESOPHAGEAL ECHOCARDIOGRAM (TEE);  Surgeon: Vesta Mixer, MD;  Location: [facility] ORS;  Service: Cardiovascular;  Laterality: N/A;   TONSILLECTOMY     TUMOR REMOVAL     right arm    Current Outpatient Medications  Medication Sig Dispense Refill   amiodarone (PACERONE) 100 MG tablet Take 1 tablet (100 mg total) by mouth daily. 90 tablet 3   amLODipine (NORVASC) 2.5 MG tablet TAKE 1 TABLET BY MOUTH EVERY DAY 90 tablet 0   apixaban (ELIQUIS) 2.5 MG TABS tablet Take 1 tablet (2.5 mg total) by mouth 2 (two) times daily. 180 tablet 3   atorvastatin (LIPITOR) 40 MG tablet TAKE 1 TABLET BY MOUTH DAILY AT 6 PM. 90 tablet 1   Cholecalciferol 1000 UNITS capsule Take 2,000 Units by mouth daily.      CINNAMON PO Take 1 tablet by mouth daily.     Coenzyme Q10-Fish Oil-Vit E (CO-Q 10 OMEGA-3 FISH OIL) CAPS Take 1 capsule by mouth 2 (two) times daily.     conjugated estrogens (PREMARIN) vaginal cream Apply 0.5mg  (pea-sized amount)  just inside the vaginal introitus with a finger-tip on  Monday, Wednesday and Friday nights. 30 g 12   CRANBERRY PO Take 1 tablet by mouth 2 (two) times daily.      doxazosin (CARDURA) 1 MG tablet TAKE 1 TABLET BY MOUTH twice daily 180 tablet 3   ezetimibe (ZETIA) 10 MG tablet TAKE 1 TABLET BY MOUTH EVERY DAY 90 tablet 0   furosemide (LASIX) 20 MG tablet TAKE 1 TABLET (20 MG TOTAL) BY MOUTH EVERY MONDAY, WEDNESDAY AND FRIDAY 12 tablet 3   levothyroxine (SYNTHROID) 50 MCG tablet Take 1 tablet (50 mcg total) by mouth daily. 30 tablet 11   LYSINE PO Take by mouth daily.     metoprolol succinate (TOPROL-XL) 25 MG 24 hr tablet Take 0.5 tablets (12.5 mg total) by mouth daily. Take with or immediately following a meal. 45 tablet 3   MISC NATURAL PRODUCTS PO Take 1  tablet by mouth daily. Multivitamin-Dr. Mel Almond     mupirocin cream (BACTROBAN) 2 % Apply 1 application topically 2 (two) times daily. 15 g 0   NON FORMULARY Cell Rebuild Takes 1 tablet BID.     NON FORMULARY Canaflex 1 tablet daily.     NON FORMULARY Epivery Takes 1 tablet bid.     NON FORMULARY Limbex take one tablet daily     NON FORMULARY Sanguenol nitric oxide one tablet daily     NON FORMULARY Reprieve one tablet daily     OVER THE COUNTER MEDICATION Take 1,040 mg by mouth 2 (two) times daily. Dandelion BID     potassium chloride SA (KLOR-CON M) 20 MEQ tablet Take 1 tablet (20 mEq total) by mouth every Monday, Wednesday, and Friday. 12 tablet 1   TURMERIC PO Take by mouth 2 (two) times daily.     No current facility-administered medications for this visit.    Allergies:   Ciprofloxacin, Ciprofloxacin, Hydrochlorothiazide, Hydrochlorothiazide w-triamterene, Sulfa antibiotics,  Sulfonamide derivatives, Valsartan, and Valsartan   Social History:  The patient  reports that she has never smoked. She has never used smokeless tobacco. She reports that she does not drink alcohol and does not use drugs.   Family History:   family history includes Cancer in her father; Diabetes in her maternal aunt; Heart disease in her mother; Osteoporosis in her mother; Stroke in her mother.    Review of Systems: Review of Systems  Constitutional:  Positive for malaise/fatigue.  HENT: Negative.    Respiratory: Negative.    Cardiovascular: Negative.   Gastrointestinal: Negative.   Musculoskeletal: Negative.   Neurological: Negative.   Psychiatric/Behavioral: Negative.    All other systems reviewed and are negative.  PHYSICAL EXAM: VS:  BP (!) 140/60 (BP Location: Left Arm, Patient Position: Sitting, Cuff Size: Normal)   Pulse (!) 59   Ht 5\' 4"  (1.626 m)   Wt 121 lb 4 oz (55 kg)   LMP  (LMP Unknown)   SpO2 95%   BMI 20.81 kg/m  , BMI Body mass index is 20.81 kg/m. Constitutional:  oriented to  person, place, and time. No distress.  HENT:  Head: Grossly normal Eyes:  no discharge. No scleral icterus.  Neck: No JVD, no carotid bruits  Cardiovascular: Regular rate and rhythm, no murmurs appreciated Pulmonary/Chest: Clear to auscultation bilaterally, no wheezes or rails Abdominal: Soft.  no distension.  no tenderness.  Musculoskeletal: Normal range of motion Neurological:  normal muscle tone. Coordination normal. No atrophy Skin: Skin warm and dry Psychiatric: normal affect, pleasant   Recent Labs: 05/28/2022: ALT 95; Hemoglobin 14.8; Platelets 168.0; Pro B Natriuretic peptide (BNP) 1,300.0 07/05/2022: BUN 22; Creatinine, Ser 0.94; Potassium 4.1; Sodium 141 08/21/2022: TSH 4.22    Lipid Panel Lab Results  Component Value Date   CHOL 125 05/21/2022   HDL 46.30 05/21/2022   LDLCALC 63 05/21/2022   TRIG 80.0 05/21/2022    Wt Readings from Last 3 Encounters:  02/24/23 121 lb 4 oz (55 kg)  08/20/22 116 lb 6 oz (52.8 kg)  08/07/22 116 lb (52.6 kg)      ASSESSMENT AND PLAN:  Paroxysmal atrial fibrillation (HCC) - Plan: EKG 12-Lead Maintaining normal sinus rhythm,  Continue amiodarone 100 daily, metoprolol succinate 12.5 daily  Leg edema No significant leg edema, on Lasix 20 mg 3 days a week  HTN Blood pressure stable, orthostatics negative  Fall Long discussion concerning whether she needs a head CT scan given fall last week She is inclined not to proceed with head scan Discussed with son who presents with her today that if she has any change in neurologic status that she call our office, or consider going to the emergency room for head CT  HYPERLIPIDEMIA Continue cholesterol medication  Cerebrovascular accident (CVA) due to thrombosis of left middle cerebral artery (HCC) Prior history of stroke, paroxysmal atrial fibrillation Known cerebrovascular disease Continue reduced dose Eliquis 2.5 twice daily age, weight For frequent falls may need to hold  Eliquis  Gait instability On last clinic visit we mentioned gait instability and need for help at home She reports sometimes son goes away for several days at a time to drive Recommended to son who presents with her today (not the one that lives with her) to consider life alert  Adjustment disorder Loss of husband 2022, lives with son   Orders Placed This Encounter  Procedures   EKG 12-Lead     Signed, Dossie Arbour, M.D., Ph.D. 02/24/2023  [facility] [place], [employer] [phone]

## 2023-02-24 ENCOUNTER — Telehealth: Payer: Self-pay

## 2023-02-24 ENCOUNTER — Ambulatory Visit: Payer: Medicare Other | Attending: Cardiovascular Disease | Admitting: Cardiovascular Disease

## 2023-02-24 ENCOUNTER — Encounter: Payer: Self-pay | Admitting: Cardiovascular Disease

## 2023-02-24 VITALS — BP 140/60 | HR 59 | Ht 64.0 in | Wt 121.2 lb

## 2023-02-24 DIAGNOSIS — I872 Venous insufficiency (chronic) (peripheral): Secondary | ICD-10-CM

## 2023-02-24 DIAGNOSIS — I10 Essential (primary) hypertension: Secondary | ICD-10-CM

## 2023-02-24 DIAGNOSIS — E782 Mixed hyperlipidemia: Secondary | ICD-10-CM | POA: Diagnosis not present

## 2023-02-24 DIAGNOSIS — I63312 Cerebral infarction due to thrombosis of left middle cerebral artery: Secondary | ICD-10-CM | POA: Diagnosis not present

## 2023-02-24 DIAGNOSIS — I498 Other specified cardiac arrhythmias: Secondary | ICD-10-CM

## 2023-02-24 DIAGNOSIS — I5022 Chronic systolic (congestive) heart failure: Secondary | ICD-10-CM

## 2023-02-24 DIAGNOSIS — I4892 Unspecified atrial flutter: Secondary | ICD-10-CM

## 2023-02-24 DIAGNOSIS — I480 Paroxysmal atrial fibrillation: Secondary | ICD-10-CM

## 2023-02-24 NOTE — Telephone Encounter (Signed)
Patient has open Gaps for HTN. Do you need to see in office to address.  Marland Kitchenlast

## 2023-02-24 NOTE — Patient Instructions (Addendum)
Medication Instructions:  No changes  If you need a refill on your cardiac medications before your next appointment, please call your pharmacy.   Lab work: No new labs needed  Testing/Procedures: No new testing needed  Follow-Up: At [facility], you and your health needs are our priority.  As part of our continuing mission to provide you with exceptional heart care, we have created designated Provider Care Teams.  These Care Teams include your primary Cardiologist (physician) and Advanced Practice Providers (APPs -  Physician Assistants and Nurse Practitioners) who all work together to provide you with the care you need, when you need it.  You will need a follow up appointment in 6 months, with APP  Providers on your designated Care Team:   Nicolasa Ducking, NP Eula Listen, PA-C Cadence Fransico Michael, [place]  COVID-19 Vaccine Information can be found at: [email or web address] For questions related to vaccine distribution or appointments, please email [email or web address] [email or web address] or call [phone].

## 2023-03-12 ENCOUNTER — Other Ambulatory Visit: Payer: Self-pay | Admitting: Family Medicine

## 2023-04-05 ENCOUNTER — Other Ambulatory Visit: Payer: Self-pay | Admitting: Cardiovascular Disease

## 2023-04-07 NOTE — Telephone Encounter (Signed)
 last office visit: 02/24/23 with plan to f/u in 6 months. next office visit: none/active recall

## 2023-04-16 ENCOUNTER — Other Ambulatory Visit: Payer: Self-pay | Admitting: Cardiovascular Disease

## 2023-04-16 DIAGNOSIS — I480 Paroxysmal atrial fibrillation: Secondary | ICD-10-CM

## 2023-04-17 NOTE — Telephone Encounter (Signed)
Refill request for Eliquis

## 2023-04-17 NOTE — Telephone Encounter (Signed)
Prescription refill request for Eliquis received. Indication: Afib  Last office visit: 02/24/23 Mariah Milling)  Scr: 0.94 (07/05/22)  Age: 83 Weight: 55kg  Appropriate dose. Refill sent.

## 2023-05-10 ENCOUNTER — Other Ambulatory Visit: Payer: Self-pay | Admitting: Cardiovascular Disease

## 2023-05-15 ENCOUNTER — Other Ambulatory Visit: Payer: Self-pay | Admitting: Cardiovascular Disease

## 2023-05-15 ENCOUNTER — Other Ambulatory Visit: Payer: Self-pay | Admitting: Family Medicine

## 2023-05-28 ENCOUNTER — Telehealth: Payer: Self-pay | Admitting: *Deleted

## 2023-05-28 DIAGNOSIS — I480 Paroxysmal atrial fibrillation: Secondary | ICD-10-CM

## 2023-05-28 DIAGNOSIS — E7800 Pure hypercholesterolemia, unspecified: Secondary | ICD-10-CM

## 2023-05-28 DIAGNOSIS — E038 Other specified hypothyroidism: Secondary | ICD-10-CM

## 2023-05-28 NOTE — Telephone Encounter (Signed)
-----   Message from Lovena Neighbours sent at 05/28/2023  1:46 PM EST ----- Regarding: Labs for Thursday 2.27.25 Please put physical lab orders in future. Thank you, Denny Peon

## 2023-05-29 ENCOUNTER — Other Ambulatory Visit (INDEPENDENT_AMBULATORY_CARE_PROVIDER_SITE_OTHER): Payer: Medicare Other

## 2023-05-29 ENCOUNTER — Encounter: Payer: Self-pay | Admitting: Family Medicine

## 2023-05-29 DIAGNOSIS — I480 Paroxysmal atrial fibrillation: Secondary | ICD-10-CM

## 2023-05-29 DIAGNOSIS — E7800 Pure hypercholesterolemia, unspecified: Secondary | ICD-10-CM | POA: Diagnosis not present

## 2023-05-29 DIAGNOSIS — E038 Other specified hypothyroidism: Secondary | ICD-10-CM

## 2023-05-29 LAB — CBC WITH DIFFERENTIAL/PLATELET
Basophils Absolute: 0 10*3/uL (ref 0.0–0.1)
Basophils Relative: 0.8 % (ref 0.0–3.0)
Eosinophils Absolute: 0.1 10*3/uL (ref 0.0–0.7)
Eosinophils Relative: 1 % (ref 0.0–5.0)
HCT: 38.6 % (ref 36.0–46.0)
Hemoglobin: 12.3 g/dL (ref 12.0–15.0)
Lymphocytes Relative: 30.9 % (ref 12.0–46.0)
Lymphs Abs: 1.6 10*3/uL (ref 0.7–4.0)
MCHC: 31.8 g/dL (ref 30.0–36.0)
MCV: 93.5 fL (ref 78.0–100.0)
Monocytes Absolute: 0.5 10*3/uL (ref 0.1–1.0)
Monocytes Relative: 10.2 % (ref 3.0–12.0)
Neutro Abs: 2.9 10*3/uL (ref 1.4–7.7)
Neutrophils Relative %: 57.1 % (ref 43.0–77.0)
Platelets: 182 10*3/uL (ref 150.0–400.0)
RBC: 4.13 Mil/uL (ref 3.87–5.11)
RDW: 14.7 % (ref 11.5–15.5)
WBC: 5 10*3/uL (ref 4.0–10.5)

## 2023-05-29 LAB — COMPREHENSIVE METABOLIC PANEL
ALT: 41 U/L — ABNORMAL HIGH (ref 0–35)
AST: 27 U/L (ref 0–37)
Albumin: 4.1 g/dL (ref 3.5–5.2)
Alkaline Phosphatase: 89 U/L (ref 39–117)
BUN: 28 mg/dL — ABNORMAL HIGH (ref 6–23)
CO2: 25 meq/L (ref 19–32)
Calcium: 9 mg/dL (ref 8.4–10.5)
Chloride: 108 meq/L (ref 96–112)
Creatinine, Ser: 1 mg/dL (ref 0.40–1.20)
GFR: 53.16 mL/min — ABNORMAL LOW (ref >60.00)
Glucose, Bld: 93 mg/dL (ref 70–99)
Potassium: 4.3 meq/L (ref 3.5–5.1)
Sodium: 141 meq/L (ref 135–145)
Total Bilirubin: 0.8 mg/dL (ref 0.2–1.2)
Total Protein: 6.5 g/dL (ref 6.0–8.3)

## 2023-05-29 LAB — LIPID PANEL
Cholesterol: 115 mg/dL (ref 0–200)
HDL: 49 mg/dL (ref >39.00)
LDL Cholesterol: 53 mg/dL (ref 0–99)
NonHDL: 66.2
Total CHOL/HDL Ratio: 2
Triglycerides: 65 mg/dL (ref 0.0–149.0)
VLDL: 13 mg/dL (ref 0.0–40.0)

## 2023-05-29 LAB — TSH: TSH: 7.89 u[IU]/mL — ABNORMAL HIGH (ref 0.35–5.50)

## 2023-05-29 LAB — T4, FREE: Free T4: 0.75 ng/dL (ref 0.60–1.60)

## 2023-05-29 LAB — T3, FREE: T3, Free: 1.9 pg/mL — ABNORMAL LOW (ref 2.3–4.2)

## 2023-06-02 ENCOUNTER — Other Ambulatory Visit: Payer: Self-pay | Admitting: Family Medicine

## 2023-06-02 MED ORDER — FUROSEMIDE 20 MG PO TABS: 20.0000 mg | ORAL_TABLET | ORAL | 0 refills | Status: DC

## 2023-06-05 ENCOUNTER — Ambulatory Visit
Admission: RE | Admit: 2023-06-05 | Discharge: 2023-06-05 | Disposition: A | Discharge status: Home / Self Care | Source: Ambulatory Visit | Attending: Family Medicine | Admitting: Family Medicine

## 2023-06-05 ENCOUNTER — Encounter: Payer: Self-pay | Admitting: Family Medicine

## 2023-06-05 ENCOUNTER — Ambulatory Visit: Payer: Medicare Other | Admitting: Family Medicine

## 2023-06-05 VITALS — BP 136/86 | HR 139 | Temp 97.7°F | Ht 63.75 in | Wt 128.4 lb

## 2023-06-05 DIAGNOSIS — E7800 Pure hypercholesterolemia, unspecified: Secondary | ICD-10-CM

## 2023-06-05 DIAGNOSIS — E038 Other specified hypothyroidism: Secondary | ICD-10-CM

## 2023-06-05 DIAGNOSIS — I69359 Hemiplegia and hemiparesis following cerebral infarction affecting unspecified side: Secondary | ICD-10-CM

## 2023-06-05 DIAGNOSIS — I5022 Chronic systolic (congestive) heart failure: Secondary | ICD-10-CM

## 2023-06-05 DIAGNOSIS — I10 Essential (primary) hypertension: Secondary | ICD-10-CM | POA: Diagnosis not present

## 2023-06-05 DIAGNOSIS — R918 Other nonspecific abnormal finding of lung field: Secondary | ICD-10-CM | POA: Diagnosis not present

## 2023-06-05 DIAGNOSIS — J0100 Acute maxillary sinusitis, unspecified: Secondary | ICD-10-CM | POA: Diagnosis not present

## 2023-06-05 DIAGNOSIS — R0602 Shortness of breath: Secondary | ICD-10-CM

## 2023-06-05 DIAGNOSIS — J90 Pleural effusion, not elsewhere classified: Secondary | ICD-10-CM

## 2023-06-05 DIAGNOSIS — Z0000 Encounter for general adult medical examination without abnormal findings: Secondary | ICD-10-CM

## 2023-06-05 DIAGNOSIS — I480 Paroxysmal atrial fibrillation: Secondary | ICD-10-CM | POA: Diagnosis not present

## 2023-06-05 DIAGNOSIS — Z0001 Encounter for general adult medical examination with abnormal findings: Secondary | ICD-10-CM

## 2023-06-05 DIAGNOSIS — R059 Cough, unspecified: Secondary | ICD-10-CM | POA: Diagnosis not present

## 2023-06-05 DIAGNOSIS — E348 Other specified endocrine disorders: Secondary | ICD-10-CM

## 2023-06-05 DIAGNOSIS — M40204 Unspecified kyphosis, thoracic region: Secondary | ICD-10-CM | POA: Diagnosis not present

## 2023-06-05 MED ORDER — AMOXICILLIN-POT CLAVULANATE 875-125 MG PO TABS
1.0000 | ORAL_TABLET | Freq: Two times a day (BID) | ORAL | 0 refills | Status: DC
Start: 2023-06-05 — End: 2023-08-07

## 2023-06-05 MED ORDER — FUROSEMIDE 20 MG PO TABS: 20.0000 mg | ORAL_TABLET | ORAL | 11 refills | Status: DC

## 2023-06-05 NOTE — Assessment & Plan Note (Signed)
Stable, uses cane to ambulate 

## 2023-06-05 NOTE — Assessment & Plan Note (Addendum)
 Chronic,  Previously in sinus rhythm but currently in the office she is in A-fib/a flutter per review of EKG.  Heart rate elevated at 143.  Followed by cardiology.Marland Kitchen  Consulted through epic with Dr. Mariah Milling.  He will have his scheduler contact her to set up an earlier appointment. We will have her increase the metoprolol XL to a full tablet of 25 mg daily for rate control. Dr. Mariah Milling recommended amiodarone load to hopefully put her back in sinus rhythm. Patient was instructed to use 400 mg of amiodarone twice daily for 5 days then decrease to 200 mg twice daily.  She was instructed to check her pulse each time before dosing the amiodarone to determine if she is back in sinus rhythm.  If heart rate less than 70 she will return to her regular amiodarone 100 mg p.o. daily dose.  Given her age and her tenuous status given she is symptomatic, I discussed with her and her son that I have a low threshold for recommending ER visit.  We discussed reasons to go to the emergency room if she is not improving over the weekend.  Anticoagulation with Eliquis

## 2023-06-05 NOTE — Patient Instructions (Addendum)
 Make sure taking levothyroxine alone. Stop unnecessary supplements.  Increase lasix to daily for the next week.  Increase metoprolol to 1 full  25 mg tablet daily. Restart flonase 2 spray per nostril daily.  Call to  make follow up with Dr Mariah Milling ASAP for atrial fibrillation  Have a low threshold for for a visit to the ER if shortness of breath and HR not improving.

## 2023-06-05 NOTE — Assessment & Plan Note (Addendum)
 Chronic, inadequate control despite levothyroxine 50 mcg daily. She reports compliance with medication but has been taking with other supplements along with it.   Will take alone and recheck in 4 weeks.

## 2023-06-05 NOTE — Progress Notes (Signed)
 Patient ID: Karen Bowman, female    DOB: Mar 25, 1941, 83 y.o.   MRN: [id number]  This visit was conducted in person.  BP 136/86   Pulse (!) 139   Temp 97.7 F (36.5 C) (Oral)   Ht 5' 3.75" (1.619 m)   Wt 128 lb 6 oz (58.2 kg)   LMP  (LMP Unknown)   SpO2 94%   BMI 22.21 kg/m    CC: Chief Complaint  Patient presents with   Annual Exam    MCR prt 2 [AWV- 07/25/23].     Subjective:   HPI: Karen Bowman is a 83 y.o. female presenting on 06/05/2023 for Annual Exam (MCR prt 2 [AWV- 07/25/23]. )   The patient presents for  complete physical and review of chronic health problems. He/She also has the following acute concerns today:  In January she had a flu, followed by sinus pressure.  Now in last several weeks she has had yellow nasal discharge, productive cough.. yellow mucus.  She is more short of breath., occ wheeze.  Feeling really tired. Using flonase 2 spray per nostril daily   No fever. She has noted some increase in swelling in both legs in last few months.  She has been taking lasix  M, W, F  The patient saw a LPN or RN for medicare wellness visit.  Scheduled Medicare wellness on July 25, 2023  Prevention and wellness was reviewed in detail. Note reviewed and important notes copied below.  Flowsheet Row Clinical Support from 05/21/2022 in [facility] [place]  PHQ-2 Total Score 0      Hypertension:   Well controlled on amlodipine, Cardura and lopressor             BP Readings from Last 3 Encounters:  06/05/23 136/86  02/24/23 (!) 140/60  08/20/22 (!) 186/67  Using medication without problems or lightheadedness:  none Chest pain with exertion: no chest pain Edema:yes Short of breath:yes Average home BPs: Other issues:  Elevated Cholesterol: LDL  at goal < 70 on atorvastatin 40 mg daily and zetia 10 mg daily. Lab Results  Component Value Date   CHOL 115 05/29/2023   HDL 49.00 05/29/2023   LDLCALC 53 05/29/2023   LDLDIRECT  206.0 03/08/2013   TRIG 65.0 05/29/2023   CHOLHDL 2 05/29/2023  Using medications without problems: Muscle aches:  Diet compliance:  moderate Exercise: use cane Other complaints: Wt Readings from Last 3 Encounters:  06/05/23 128 lb 6 oz (58.2 kg)  02/24/23 121 lb 4 oz (55 kg)  08/20/22 116 lb 6 oz (52.8 kg)   Elevated LFTs: no new supplements but she takes a lot of supplements. Now LFTs decreased from last check. AST nml , only ALT slightly elevated.  No ETOH, no tylenol.  No family history of liver issues.   CAD, Paroxsysmal atrial fibrillation, CHF second degree AV block: followed by Dr. Mariah Milling... reviewed last note   On eliquis, amlodipine , amiodarone and metoprolol, aggressive cholesterol control.  She states she has been compliant.  Hx of CVA  Hypothyroidism: Inadequate control despite levothyroxine 50 mcg daily.. She has been taking levo with other supplements. Not botin Lab Results  Component Value Date   TSH 7.89 (H) 05/29/2023     Relevant past medical, surgical, family and social history reviewed and updated as indicated. Interim medical history since our last visit reviewed. Allergies and medications reviewed and updated. Outpatient Medications Prior to Visit  Medication Sig Dispense Refill  amiodarone (PACERONE) 100 MG tablet Take 1 tablet (100 mg total) by mouth daily. 90 tablet 3   amLODipine (NORVASC) 2.5 MG tablet TAKE 1 TABLET BY MOUTH EVERY DAY 90 tablet 0   apixaban (ELIQUIS) 2.5 MG TABS tablet TAKE 1 TABLET BY MOUTH TWICE A DAY 180 tablet 1   atorvastatin (LIPITOR) 40 MG tablet TAKE 1 TABLET BY MOUTH DAILY AT 6 PM. 90 tablet 1   Cholecalciferol 1000 UNITS capsule Take 2,000 Units by mouth daily.      CINNAMON PO Take 1 tablet by mouth daily.     Coenzyme Q10-Fish Oil-Vit E (CO-Q 10 OMEGA-3 FISH OIL) CAPS Take 1 capsule by mouth 2 (two) times daily.     conjugated estrogens (PREMARIN) vaginal cream Apply 0.5mg  (pea-sized amount)  just inside the vaginal  introitus with a finger-tip on  Monday, Wednesday and Friday nights. 30 g 12   CRANBERRY PO Take 1 tablet by mouth 2 (two) times daily.      doxazosin (CARDURA) 1 MG tablet TAKE 1 TABLET BY MOUTH EVERY DAY IN THE EVENING 90 tablet 0   ezetimibe (ZETIA) 10 MG tablet TAKE 1 TABLET BY MOUTH EVERY DAY 90 tablet 0   levothyroxine (SYNTHROID) 50 MCG tablet Take 1 tablet (50 mcg total) by mouth daily. 30 tablet 11   LYSINE PO Take by mouth daily.     MAGNESIUM PO Take by mouth daily.     metoprolol succinate (TOPROL-XL) 25 MG 24 hr tablet TAKE 0.5 TABLETS BY MOUTH DAILY. TAKE WITH OR IMMEDIATELY FOLLOWING A MEAL. 45 tablet 3   MISC NATURAL PRODUCTS PO Take 1 tablet by mouth daily. Multivitamin-Dr. Mel Almond     mupirocin cream (BACTROBAN) 2 % Apply 1 application topically 2 (two) times daily. 15 g 0   NON FORMULARY Cell Rebuild Takes 1 tablet BID.     NON FORMULARY Canaflex 1 tablet daily.     NON FORMULARY Epivery Takes 1 tablet bid.     NON FORMULARY Limbex take one tablet daily     NON FORMULARY Sanguenol nitric oxide one tablet daily     NON FORMULARY Reprieve one tablet daily     OVER THE COUNTER MEDICATION Take 1,040 mg by mouth 2 (two) times daily. Dandelion BID     potassium chloride SA (KLOR-CON M) 20 MEQ tablet TAKE 1 TABLET (20 MEQ TOTAL) BY MOUTH EVERY MONDAY, WEDNESDAY, AND FRIDAY. 36 tablet 0   TURMERIC PO Take by mouth 2 (two) times daily.     VITAMIN E PO Take by mouth daily.     amiodarone (PACERONE) 100 MG tablet Take 1 tablet (100 mg total) by mouth daily. 90 tablet 1   furosemide (LASIX) 20 MG tablet Take 1 tablet (20 mg total) by mouth every Monday, Wednesday, and Friday. 12 tablet 0   No facility-administered medications prior to visit.     Per HPI unless specifically indicated in ROS section below Review of Systems  Constitutional:  Positive for fatigue. Negative for fever.  HENT:  Negative for congestion.   Eyes:  Negative for pain.  Respiratory:  Positive for shortness  of breath. Negative for cough.   Cardiovascular:  Negative for chest pain, palpitations and leg swelling.  Gastrointestinal:  Negative for abdominal pain.  Genitourinary:  Negative for dysuria and vaginal bleeding.  Musculoskeletal:  Negative for back pain.  Neurological:  Positive for weakness. Negative for syncope, light-headedness and headaches.  Psychiatric/Behavioral:  Negative for dysphoric mood.    Objective:  BP  136/86   Pulse (!) 139   Temp 97.7 F (36.5 C) (Oral)   Ht 5' 3.75" (1.619 m)   Wt 128 lb 6 oz (58.2 kg)   LMP  (LMP Unknown)   SpO2 94%   BMI 22.21 kg/m   Wt Readings from Last 3 Encounters:  06/05/23 128 lb 6 oz (58.2 kg)  02/24/23 121 lb 4 oz (55 kg)  08/20/22 116 lb 6 oz (52.8 kg)      Physical Exam Constitutional:      General: She is not in acute distress.    Appearance: Normal appearance. She is well-developed. She is not ill-appearing or toxic-appearing.  HENT:     Head: Normocephalic.     Right Ear: Hearing, tympanic membrane, ear canal and external ear normal. Tympanic membrane is not erythematous, retracted or bulging.     Left Ear: Hearing, tympanic membrane, ear canal and external ear normal. Tympanic membrane is not erythematous, retracted or bulging.     Nose: No mucosal edema or rhinorrhea.     Right Sinus: No maxillary sinus tenderness or frontal sinus tenderness.     Left Sinus: No maxillary sinus tenderness or frontal sinus tenderness.     Mouth/Throat:     Pharynx: Uvula midline.  Eyes:     General: Lids are normal. Lids are everted, no foreign bodies appreciated.     Conjunctiva/sclera: Conjunctivae normal.     Pupils: Pupils are equal, round, and reactive to light.  Neck:     Thyroid: No thyroid mass or thyromegaly.     Vascular: No carotid bruit.     Trachea: Trachea normal.  Cardiovascular:     Rate and Rhythm: Tachycardia present. Rhythm irregularly irregular.     Pulses: Normal pulses.     Heart sounds: Normal heart sounds, S1  normal and S2 normal. No murmur heard.    No friction rub. No gallop.  Pulmonary:     Effort: Pulmonary effort is normal. No tachypnea or respiratory distress.     Breath sounds: Examination of the right-lower field reveals decreased breath sounds. Examination of the left-lower field reveals decreased breath sounds. Decreased breath sounds present. No wheezing, rhonchi or rales.  Abdominal:     General: Bowel sounds are normal.     Palpations: Abdomen is soft.     Tenderness: There is no abdominal tenderness.  Musculoskeletal:     Cervical back: Normal range of motion and neck supple.     Right lower leg: 2+ Pitting Edema present.     Left lower leg: 1+ Edema present.  Skin:    General: Skin is warm and dry.     Findings: No rash.  Neurological:     Mental Status: She is alert.  Psychiatric:        Mood and Affect: Mood is not anxious or depressed.        Speech: Speech normal.        Behavior: Behavior normal. Behavior is cooperative.        Thought Content: Thought content normal.        Judgment: Judgment normal.       Results for orders placed or performed in visit on 05/29/23  CBC with Differential/Platelet   Collection Time: 05/29/23 10:27 AM  Result Value Ref Range   WBC 5.0 4.0 - 10.5 K/uL   RBC 4.13 3.87 - 5.11 Mil/uL   Hemoglobin 12.3 12.0 - 15.0 g/dL   HCT 32.9 51.8 - 84.1 %   MCV  93.5 78.0 - 100.0 fl   MCHC 31.8 30.0 - 36.0 g/dL   RDW 81.1 91.4 - 78.2 %   Platelets 182.0 150.0 - 400.0 K/uL   Neutrophils Relative % 57.1 43.0 - 77.0 %   Lymphocytes Relative 30.9 12.0 - 46.0 %   Monocytes Relative 10.2 3.0 - 12.0 %   Eosinophils Relative 1.0 0.0 - 5.0 %   Basophils Relative 0.8 0.0 - 3.0 %   Neutro Abs 2.9 1.4 - 7.7 K/uL   Lymphs Abs 1.6 0.7 - 4.0 K/uL   Monocytes Absolute 0.5 0.1 - 1.0 K/uL   Eosinophils Absolute 0.1 0.0 - 0.7 K/uL   Basophils Absolute 0.0 0.0 - 0.1 K/uL  TSH   Collection Time: 05/29/23 10:27 AM  Result Value Ref Range   TSH 7.89 (H) 0.35  - 5.50 uIU/mL  T4, free   Collection Time: 05/29/23 10:27 AM  Result Value Ref Range   Free T4 0.75 0.60 - 1.60 ng/dL  T3, free   Collection Time: 05/29/23 10:27 AM  Result Value Ref Range   T3, Free 1.9 (L) 2.3 - 4.2 pg/mL  Lipid panel   Collection Time: 05/29/23 10:27 AM  Result Value Ref Range   Cholesterol 115 0 - 200 mg/dL   Triglycerides 95.6 0.0 - 149.0 mg/dL   HDL 21.30 >86.57 mg/dL   VLDL 84.6 0.0 - 96.2 mg/dL   LDL Cholesterol 53 0 - 99 mg/dL   Total CHOL/HDL Ratio 2    NonHDL 66.20   Comprehensive metabolic panel   Collection Time: 05/29/23 10:27 AM  Result Value Ref Range   Sodium 141 135 - 145 mEq/L   Potassium 4.3 3.5 - 5.1 mEq/L   Chloride 108 96 - 112 mEq/L   CO2 25 19 - 32 mEq/L   Glucose, Bld 93 70 - 99 mg/dL   BUN 28 (H) 6 - 23 mg/dL   Creatinine, Ser 9.52 0.40 - 1.20 mg/dL   Total Bilirubin 0.8 0.2 - 1.2 mg/dL   Alkaline Phosphatase 89 39 - 117 U/L   AST 27 0 - 37 U/L   ALT 41 (H) 0 - 35 U/L   Total Protein 6.5 6.0 - 8.3 g/dL   Albumin 4.1 3.5 - 5.2 g/dL   GFR 84.13 (L) >24.40 mL/min   Calcium 9.0 8.4 - 10.5 mg/dL    This visit occurred during the SARS-CoV-2 public health emergency.  Safety protocols were in place, including screening questions prior to the visit, additional usage of staff PPE, and extensive cleaning of exam room while observing appropriate contact time as indicated for disinfecting solutions.   COVID 19 screen:  No recent travel or known exposure to COVID19 The patient denies respiratory symptoms of COVID 19 at this time. The importance of social distancing was discussed today.   Assessment and Plan The patient's preventative maintenance and recommended screening tests for an annual wellness exam were reviewed in full today. Brought up to date unless services declined.  Counselled on the importance of diet, exercise, and its role in overall health and mortality. The patient's FH and SH was reviewed, including their home life,  tobacco status, and drug and alcohol status.   DEXA done in 2010.Marland Kitchen Not interested in repeating 2025 Vaccines:Due for PNA and shingles. SE to flu vaccine in past.  She is not interested.  COVID vaccine: 11/21/2019 , , plans to get booster Due for mammogram,never went last year, not interested in repeating 2025 DVE/pap: not indicated, s/p hysterectomy Colon:never sent  back ifob 2014, 2015 or 2016.Marland Kitchen Refused to complete... no further indicated after age 66.   Problem List Items Addressed This Visit     Acute non-recurrent maxillary sinusitis   Acute, patient with current symptoms consistent with bacterial sinusitis.  Will treat with Augmentin twice daily for 10 days. Recommended restarting Flonase 2 sprays per nostril daily.  Discussed her difficulties with the side spray and she will have her son look for the top  press nasal spray bottle.      Relevant Medications   amoxicillin-clavulanate (AUGMENTIN) 875-125 MG tablet   Bilateral pleural effusion   No clear sign of empyema.  Patient without fever.  If not improving with diuresis will refer for therapeutic thoracentesis.      Chronic systolic congestive heart failure (HCC)   Chronic, fluid overloaded in office today.  This is likely contributing to shortness of breath.  Chest x-ray shows concern for pleural effusion likely exacerbated by A-fib. Patient does have some sinus congestion symptoms but nothing that clearly suggests lung infection.  Will have her increase Lasix to 20 mg every day for the next week. We will follow-up with her in my office next week.   Followed by cardiology      Relevant Medications   furosemide (LASIX) 20 MG tablet   Hemiparesis affecting dominant side as late effect of stroke (HCC)   Stable, uses cane to ambulate      HLD (hyperlipidemia)   Stable, chronic.  Continue current medication.   LDL  at goal < 70 on atorvastatin 40 mg and zetia 10 mg daily.       Relevant Medications   furosemide  (LASIX) 20 MG tablet   HYPERTENSION, BENIGN ESSENTIAL (Chronic)   Stable, chronic.  Continue current medication.  Amlodipine 10 mg daily. Cardura 1 mg daily Lopressor  increase to full tablet daily for now      Relevant Medications   furosemide (LASIX) 20 MG tablet   Paroxysmal atrial fibrillation (HCC)    Chronic,  Previously in sinus rhythm but currently in the office she is in A-fib/a flutter per review of EKG.  Heart rate elevated at 143.  Followed by cardiology.Marland Kitchen  Consulted through epic with Dr. Mariah Milling.  He will have his scheduler contact her to set up an earlier appointment. We will have her increase the metoprolol XL to a full tablet of 25 mg daily for rate control. Dr. Mariah Milling recommended amiodarone load to hopefully put her back in sinus rhythm. Patient was instructed to use 400 mg of amiodarone twice daily for 5 days then decrease to 200 mg twice daily.  She was instructed to check her pulse each time before dosing the amiodarone to determine if she is back in sinus rhythm.  If heart rate less than 70 she will return to her regular amiodarone 100 mg p.o. daily dose.  Given her age and her tenuous status given she is symptomatic, I discussed with her and her son that I have a low threshold for recommending ER visit.  We discussed reasons to go to the emergency room if she is not improving over the weekend.  Anticoagulation with Eliquis        Relevant Medications   furosemide (LASIX) 20 MG tablet   Other Relevant Orders   EKG 12-Lead (Completed)   Shortness of breath   Acute, likely due to pleural effusion and ongoing atrial fibrillation. If this is not improving with diuresis and return to sinus rhythm will recommend thoracentesis. She  has an appointment for reevaluation next week.  Return and ER precautions provided.      Relevant Orders   DG Chest 2 View (Completed)   Subclinical hypothyroidism   Chronic, inadequate control despite levothyroxine 50 mcg daily. She  reports compliance with medication but has been taking with other supplements along with it.   Will take alone and recheck in 4 weeks.       Other Visit Diagnoses       Routine general medical examination at a health care facility    -  Primary     Estradiol deficiency       Relevant Orders   DG Bone Density      Addendum: message from Dr. Mariah Milling recommended: amiodarone 400 twice a day for 5 days then down to 200 twice a day.  Would recommend she monitor her pulse rate before taking amiodarone morning and evening and hold the amiodarone if heart rate less than 70 as that might indicate she has converted back to normal sinus rhythm.  Kerby Nora, MD

## 2023-06-05 NOTE — Assessment & Plan Note (Addendum)
 Chronic, fluid overloaded in office today.  This is likely contributing to shortness of breath.  Chest x-ray shows concern for pleural effusion likely exacerbated by A-fib. Patient does have some sinus congestion symptoms but nothing that clearly suggests lung infection.  Will have her increase Lasix to 20 mg every day for the next week. We will follow-up with her in my office next week.   Followed by cardiology

## 2023-06-05 NOTE — Assessment & Plan Note (Signed)
 Stable, chronic.  Continue current medication.   LDL  at goal < 70 on atorvastatin 40 mg and zetia 10 mg daily.

## 2023-06-05 NOTE — Assessment & Plan Note (Addendum)
 Stable, chronic.  Continue current medication.  Amlodipine 10 mg daily. Cardura 1 mg daily Lopressor  increase to full tablet daily for now

## 2023-06-06 ENCOUNTER — Encounter: Payer: Self-pay | Admitting: Family Medicine

## 2023-06-06 ENCOUNTER — Telehealth: Payer: Self-pay | Admitting: Cardiovascular Disease

## 2023-06-06 ENCOUNTER — Other Ambulatory Visit: Payer: Self-pay | Admitting: Family Medicine

## 2023-06-06 DIAGNOSIS — R0602 Shortness of breath: Secondary | ICD-10-CM | POA: Insufficient documentation

## 2023-06-06 DIAGNOSIS — J90 Pleural effusion, not elsewhere classified: Secondary | ICD-10-CM | POA: Insufficient documentation

## 2023-06-06 MED ORDER — AMIODARONE HCL 100 MG PO TABS: 100.0000 mg | ORAL_TABLET | Freq: Every day | ORAL | 1 refills | Status: DC

## 2023-06-06 NOTE — Assessment & Plan Note (Signed)
 No clear sign of empyema.  Patient without fever.  If not improving with diuresis will refer for therapeutic thoracentesis.

## 2023-06-06 NOTE — Assessment & Plan Note (Signed)
 Acute, likely due to pleural effusion and ongoing atrial fibrillation. If this is not improving with diuresis and return to sinus rhythm will recommend thoracentesis. She has an appointment for reevaluation next week.  Return and ER precautions provided.

## 2023-06-06 NOTE — Telephone Encounter (Signed)
-----   Message from Karen Bowman sent at 06/05/2023  4:05 PM EST ----- Needs appointment for atrial fibrillation with [facility] Hopefully Friday or Monday Myself or APP Thx TGollan

## 2023-06-06 NOTE — Assessment & Plan Note (Signed)
 Acute, patient with current symptoms consistent with bacterial sinusitis.  Will treat with Augmentin twice daily for 10 days. Recommended restarting Flonase 2 sprays per nostril daily.  Discussed her difficulties with the side spray and she will have her son look for the top  press nasal spray bottle.

## 2023-06-06 NOTE — Telephone Encounter (Signed)
Left message on voice mail to schedule

## 2023-06-11 ENCOUNTER — Ambulatory Visit: Payer: Medicare Other | Admitting: Dermatology

## 2023-06-12 ENCOUNTER — Ambulatory Visit (INDEPENDENT_AMBULATORY_CARE_PROVIDER_SITE_OTHER): Admitting: Family Medicine

## 2023-06-12 ENCOUNTER — Encounter: Payer: Self-pay | Admitting: Family Medicine

## 2023-06-12 ENCOUNTER — Ambulatory Visit (INDEPENDENT_AMBULATORY_CARE_PROVIDER_SITE_OTHER)
Admission: RE | Admit: 2023-06-12 | Discharge: 2023-06-12 | Disposition: A | Discharge status: Home / Self Care | Source: Ambulatory Visit | Attending: Family Medicine | Admitting: Family Medicine

## 2023-06-12 VITALS — BP 160/72 | HR 59 | Temp 98.6°F | Ht 63.75 in | Wt 123.0 lb

## 2023-06-12 DIAGNOSIS — J0100 Acute maxillary sinusitis, unspecified: Secondary | ICD-10-CM

## 2023-06-12 DIAGNOSIS — I5022 Chronic systolic (congestive) heart failure: Secondary | ICD-10-CM | POA: Diagnosis not present

## 2023-06-12 DIAGNOSIS — R0989 Other specified symptoms and signs involving the circulatory and respiratory systems: Secondary | ICD-10-CM | POA: Diagnosis not present

## 2023-06-12 DIAGNOSIS — I480 Paroxysmal atrial fibrillation: Secondary | ICD-10-CM

## 2023-06-12 DIAGNOSIS — J90 Pleural effusion, not elsewhere classified: Secondary | ICD-10-CM

## 2023-06-12 DIAGNOSIS — I517 Cardiomegaly: Secondary | ICD-10-CM | POA: Diagnosis not present

## 2023-06-12 DIAGNOSIS — I700 Atherosclerosis of aorta: Secondary | ICD-10-CM | POA: Diagnosis not present

## 2023-06-12 DIAGNOSIS — I10 Essential (primary) hypertension: Secondary | ICD-10-CM

## 2023-06-12 MED ORDER — AMIODARONE HCL 100 MG PO TABS: 100.0000 mg | ORAL_TABLET | Freq: Every day | ORAL | 3 refills | Status: DC

## 2023-06-12 NOTE — Assessment & Plan Note (Signed)
 Chronic, with acute episode of atrial fibrillation.  Now status post amiodarone load and increase in metoprolol to 25 mg daily she is back in sinus rhythm.  Blood pressure is somewhat elevated today in office. She has returned to lower dose of amiodarone, but per cardiology instructions I have encouraged her to decrease to amiodarone 100 mg once daily until follow-up appointment tomorrow. Given heart rate now at 59, I expect cardiology may have her return to her regular metoprolol dose but I will hold off making the change at this moment.

## 2023-06-12 NOTE — Progress Notes (Signed)
 Patient ID: Karen Bowman, female    DOB: 1941/02/28, 83 y.o.   MRN: [id number]  This visit was conducted in person.  BP (!) 160/72 (BP Location: Left Arm, Patient Position: Sitting, Cuff Size: Normal)   Pulse (!) 59   Temp 98.6 F (37 C) (Oral)   Ht 5' 3.75" (1.619 m)   Wt 123 lb (55.8 kg)   LMP  (LMP Unknown)   SpO2 94%   BMI 21.28 kg/m    CC:  Chief Complaint  Patient presents with   Medical Management of Chronic Issues    1 Week Follow Up    Subjective:   HPI: Karen Bowman is a 83 y.o. female presenting on 06/12/2023 for Medical Management of Chronic Issues (1 Week Follow Up) She returns today for 1 week follow-up on shortness of breath caused by atrial fibrillation, fluid overload, bilateral pleural effusions. She reports she feels significantly better. HR decreased to Less than 70 about 2 days ago.  She has  decreased to 100 mg  twice daily  She is using furosemide daily ( with potassium 20 mEQ) ... She is urinating a lot. Swelling is down in her ankles significantly, she has lost 5 pounds in the last week  Less shortness of breath. Breathing more easily. She is able to ambulate better with less shortness of breath.   Sinus pressure and face pain better but not resolved, has 3 days of antibiotics left. No fever  BP Readings from Last 3 Encounters:  06/12/23 (!) 160/72  06/05/23 136/86  02/24/23 (!) 140/60    Wt Readings from Last 3 Encounters:  06/12/23 123 lb (55.8 kg)  06/05/23 128 lb 6 oz (58.2 kg)  02/24/23 121 lb 4 oz (55 kg)    HAS appt tommorow with Dr. Mariah Milling.     Relevant past medical, surgical, family and social history reviewed and updated as indicated. Interim medical history since our last visit reviewed. Allergies and medications reviewed and updated. Outpatient Medications Prior to Visit  Medication Sig Dispense Refill   amiodarone (PACERONE) 100 MG tablet Take 100 mg by mouth 2 (two) times daily.     doxazosin (CARDURA) 1 MG  tablet Take 1 mg by mouth 2 (two) times daily.     amLODipine (NORVASC) 2.5 MG tablet TAKE 1 TABLET BY MOUTH EVERY DAY 90 tablet 0   amoxicillin-clavulanate (AUGMENTIN) 875-125 MG tablet Take 1 tablet by mouth 2 (two) times daily. 20 tablet 0   apixaban (ELIQUIS) 2.5 MG TABS tablet TAKE 1 TABLET BY MOUTH TWICE A DAY 180 tablet 1   atorvastatin (LIPITOR) 40 MG tablet TAKE 1 TABLET BY MOUTH DAILY AT 6 PM. 90 tablet 1   Cholecalciferol 1000 UNITS capsule Take 2,000 Units by mouth daily.      CINNAMON PO Take 1 tablet by mouth daily.     Coenzyme Q10-Fish Oil-Vit E (CO-Q 10 OMEGA-3 FISH OIL) CAPS Take 1 capsule by mouth 2 (two) times daily.     conjugated estrogens (PREMARIN) vaginal cream Apply 0.5mg  (pea-sized amount)  just inside the vaginal introitus with a finger-tip on  Monday, Wednesday and Friday nights. 30 g 12   CRANBERRY PO Take 1 tablet by mouth 2 (two) times daily.      ezetimibe (ZETIA) 10 MG tablet TAKE 1 TABLET BY MOUTH EVERY DAY 90 tablet 0   furosemide (LASIX) 20 MG tablet Take 1 tablet (20 mg total) by mouth every Monday, Wednesday, and Friday. Increase for 1 week  to daily then return to three days a week 30 tablet 11   levothyroxine (SYNTHROID) 50 MCG tablet Take 1 tablet (50 mcg total) by mouth daily. 30 tablet 11   LYSINE PO Take by mouth daily.     MAGNESIUM PO Take by mouth daily.     metoprolol succinate (TOPROL-XL) 25 MG 24 hr tablet TAKE 0.5 TABLETS BY MOUTH DAILY. TAKE WITH OR IMMEDIATELY FOLLOWING A MEAL. 45 tablet 3   MISC NATURAL PRODUCTS PO Take 1 tablet by mouth daily. Multivitamin-Dr. Mel Almond     mupirocin cream (BACTROBAN) 2 % Apply 1 application topically 2 (two) times daily. 15 g 0   NON FORMULARY Cell Rebuild Takes 1 tablet BID.     NON FORMULARY Canaflex 1 tablet daily.     NON FORMULARY Epivery Takes 1 tablet bid.     NON FORMULARY Limbex take one tablet daily     NON FORMULARY Sanguenol nitric oxide one tablet daily     NON FORMULARY Reprieve one tablet  daily     OVER THE COUNTER MEDICATION Take 1,040 mg by mouth 2 (two) times daily. Dandelion BID     potassium chloride SA (KLOR-CON M) 20 MEQ tablet TAKE 1 TABLET (20 MEQ TOTAL) BY MOUTH EVERY MONDAY, WEDNESDAY, AND FRIDAY. 36 tablet 0   TURMERIC PO Take by mouth 2 (two) times daily.     VITAMIN E PO Take by mouth daily.     amiodarone (PACERONE) 100 MG tablet Take 1 tablet (100 mg total) by mouth daily. 90 tablet 3   amiodarone (PACERONE) 100 MG tablet Take 1 tablet (100 mg total) by mouth daily. 90 tablet 1   doxazosin (CARDURA) 1 MG tablet TAKE 1 TABLET BY MOUTH EVERY DAY IN THE EVENING 90 tablet 0   No facility-administered medications prior to visit.     Per HPI unless specifically indicated in ROS section below Review of Systems  Constitutional:  Positive for fatigue. Negative for fever.  HENT:  Positive for congestion.   Eyes:  Negative for pain.  Respiratory:  Positive for shortness of breath. Negative for cough.   Cardiovascular:  Negative for chest pain, palpitations and leg swelling.  Gastrointestinal:  Negative for abdominal pain.  Genitourinary:  Negative for dysuria and vaginal bleeding.  Musculoskeletal:  Negative for back pain.  Neurological:  Negative for syncope, light-headedness and headaches.  Psychiatric/Behavioral:  Negative for dysphoric mood.    Objective:  BP (!) 160/72 (BP Location: Left Arm, Patient Position: Sitting, Cuff Size: Normal)   Pulse (!) 59   Temp 98.6 F (37 C) (Oral)   Ht 5' 3.75" (1.619 m)   Wt 123 lb (55.8 kg)   LMP  (LMP Unknown)   SpO2 94%   BMI 21.28 kg/m   Wt Readings from Last 3 Encounters:  06/12/23 123 lb (55.8 kg)  06/05/23 128 lb 6 oz (58.2 kg)  02/24/23 121 lb 4 oz (55 kg)      Physical Exam Constitutional:      General: She is not in acute distress.    Appearance: Normal appearance. She is well-developed. She is not ill-appearing or toxic-appearing.     Comments: Elderly female  HENT:     Head: Normocephalic.      Right Ear: Hearing, tympanic membrane, ear canal and external ear normal. Tympanic membrane is not erythematous, retracted or bulging.     Left Ear: Hearing, tympanic membrane, ear canal and external ear normal. Tympanic membrane is not erythematous, retracted or bulging.  Nose: No mucosal edema or rhinorrhea.     Right Sinus: No maxillary sinus tenderness or frontal sinus tenderness.     Left Sinus: No maxillary sinus tenderness or frontal sinus tenderness.     Mouth/Throat:     Pharynx: Uvula midline.  Eyes:     General: Lids are normal. Lids are everted, no foreign bodies appreciated.     Conjunctiva/sclera: Conjunctivae normal.     Pupils: Pupils are equal, round, and reactive to light.  Neck:     Thyroid: No thyroid mass or thyromegaly.     Vascular: No carotid bruit.     Trachea: Trachea normal.  Cardiovascular:     Rate and Rhythm: Normal rate and regular rhythm.     Pulses: Normal pulses.     Heart sounds: Normal heart sounds, S1 normal and S2 normal. No murmur heard.    No friction rub. No gallop.  Pulmonary:     Effort: Pulmonary effort is normal. No tachypnea or respiratory distress.     Breath sounds: Examination of the right-lower field reveals decreased breath sounds. Examination of the left-lower field reveals decreased breath sounds. Decreased breath sounds present. No wheezing, rhonchi or rales.  Abdominal:     General: Bowel sounds are normal.     Palpations: Abdomen is soft.     Tenderness: There is no abdominal tenderness.  Musculoskeletal:     Cervical back: Normal range of motion and neck supple.     Right lower leg: 1+ Pitting Edema present.     Left lower leg: 1+ Pitting Edema present.  Skin:    General: Skin is warm and dry.     Findings: No rash.  Neurological:     Mental Status: She is alert.  Psychiatric:        Mood and Affect: Mood is not anxious or depressed.        Speech: Speech normal.        Behavior: Behavior normal. Behavior is  cooperative.        Thought Content: Thought content normal.        Judgment: Judgment normal.       Results for orders placed or performed in visit on 05/29/23  CBC with Differential/Platelet   Collection Time: 05/29/23 10:27 AM  Result Value Ref Range   WBC 5.0 4.0 - 10.5 K/uL   RBC 4.13 3.87 - 5.11 Mil/uL   Hemoglobin 12.3 12.0 - 15.0 g/dL   HCT 16.1 09.6 - 04.5 %   MCV 93.5 78.0 - 100.0 fl   MCHC 31.8 30.0 - 36.0 g/dL   RDW 40.9 81.1 - 91.4 %   Platelets 182.0 150.0 - 400.0 K/uL   Neutrophils Relative % 57.1 43.0 - 77.0 %   Lymphocytes Relative 30.9 12.0 - 46.0 %   Monocytes Relative 10.2 3.0 - 12.0 %   Eosinophils Relative 1.0 0.0 - 5.0 %   Basophils Relative 0.8 0.0 - 3.0 %   Neutro Abs 2.9 1.4 - 7.7 K/uL   Lymphs Abs 1.6 0.7 - 4.0 K/uL   Monocytes Absolute 0.5 0.1 - 1.0 K/uL   Eosinophils Absolute 0.1 0.0 - 0.7 K/uL   Basophils Absolute 0.0 0.0 - 0.1 K/uL  TSH   Collection Time: 05/29/23 10:27 AM  Result Value Ref Range   TSH 7.89 (H) 0.35 - 5.50 uIU/mL  T4, free   Collection Time: 05/29/23 10:27 AM  Result Value Ref Range   Free T4 0.75 0.60 - 1.60 ng/dL  T3,  free   Collection Time: 05/29/23 10:27 AM  Result Value Ref Range   T3, Free 1.9 (L) 2.3 - 4.2 pg/mL  Lipid panel   Collection Time: 05/29/23 10:27 AM  Result Value Ref Range   Cholesterol 115 0 - 200 mg/dL   Triglycerides 16.1 0.0 - 149.0 mg/dL   HDL 09.60 >45.40 mg/dL   VLDL 98.1 0.0 - 19.1 mg/dL   LDL Cholesterol 53 0 - 99 mg/dL   Total CHOL/HDL Ratio 2    NonHDL 66.20   Comprehensive metabolic panel   Collection Time: 05/29/23 10:27 AM  Result Value Ref Range   Sodium 141 135 - 145 mEq/L   Potassium 4.3 3.5 - 5.1 mEq/L   Chloride 108 96 - 112 mEq/L   CO2 25 19 - 32 mEq/L   Glucose, Bld 93 70 - 99 mg/dL   BUN 28 (H) 6 - 23 mg/dL   Creatinine, Ser 4.78 0.40 - 1.20 mg/dL   Total Bilirubin 0.8 0.2 - 1.2 mg/dL   Alkaline Phosphatase 89 39 - 117 U/L   AST 27 0 - 37 U/L   ALT 41 (H) 0 - 35 U/L    Total Protein 6.5 6.0 - 8.3 g/dL   Albumin 4.1 3.5 - 5.2 g/dL   GFR 29.56 (L) >21.30 mL/min   Calcium 9.0 8.4 - 10.5 mg/dL    Assessment and Plan  Bilateral pleural effusion Assessment & Plan: Acute, likely due to atrial fibrillation and fluid overload.  Will recheck chest x-ray today to make sure there has been interval improvement.  On my read in office there has been a reduction in bilateral pleural effusions.  Will await radiologist overread  There is no evidence of associated lung infection.   Orders: -     Basic metabolic panel -     Brain natriuretic peptide -     DG Chest 2 View; Future  Chronic systolic congestive heart failure (HCC) Assessment & Plan: Chronic with acute fluid overload.  Significant improvement with Lasix 20 mg every day.  Will evaluate potassium as well as renal function, if no decreased renal function/overdiuresis we will continue Lasix 20 mg daily.   HYPERTENSION, BENIGN ESSENTIAL  Paroxysmal atrial fibrillation (HCC) Assessment & Plan: Chronic, with acute episode of atrial fibrillation.  Now status post amiodarone load and increase in metoprolol to 25 mg daily she is back in sinus rhythm.  Blood pressure is somewhat elevated today in office. She has returned to lower dose of amiodarone, but per cardiology instructions I have encouraged her to decrease to amiodarone 100 mg once daily until follow-up appointment tomorrow. Given heart rate now at 59, I expect cardiology may have her return to her regular metoprolol dose but I will hold off making the change at this moment.   Acute non-recurrent maxillary sinusitis Assessment & Plan: Acute, significant improvement in sinus congestion and facial pressure with Augmentin.  She will complete the course of antibiotics.   Other orders -     Amiodarone HCl; Take 1 tablet (100 mg total) by mouth daily.  Dispense: 90 tablet; Refill: 3    No follow-ups on file.   Kerby Nora, MD

## 2023-06-12 NOTE — Assessment & Plan Note (Signed)
 Acute, likely due to atrial fibrillation and fluid overload.  Will recheck chest x-ray today to make sure there has been interval improvement.  On my read in office there has been a reduction in bilateral pleural effusions.  Will await radiologist overread  There is no evidence of associated lung infection.

## 2023-06-12 NOTE — Assessment & Plan Note (Signed)
 Acute, significant improvement in sinus congestion and facial pressure with Augmentin.  She will complete the course of antibiotics.

## 2023-06-12 NOTE — Assessment & Plan Note (Signed)
 Chronic with acute fluid overload.  Significant improvement with Lasix 20 mg every day.  Will evaluate potassium as well as renal function, if no decreased renal function/overdiuresis we will continue Lasix 20 mg daily.

## 2023-06-13 ENCOUNTER — Encounter: Payer: Self-pay | Admitting: Medical

## 2023-06-13 ENCOUNTER — Ambulatory Visit: Attending: Medical | Admitting: Medical

## 2023-06-13 ENCOUNTER — Encounter: Payer: Self-pay | Admitting: Family Medicine

## 2023-06-13 VITALS — BP 142/69 | HR 55 | Ht 63.0 in | Wt 121.4 lb

## 2023-06-13 DIAGNOSIS — R600 Localized edema: Secondary | ICD-10-CM | POA: Diagnosis not present

## 2023-06-13 DIAGNOSIS — J90 Pleural effusion, not elsewhere classified: Secondary | ICD-10-CM

## 2023-06-13 DIAGNOSIS — I5031 Acute diastolic (congestive) heart failure: Secondary | ICD-10-CM | POA: Diagnosis not present

## 2023-06-13 DIAGNOSIS — I480 Paroxysmal atrial fibrillation: Secondary | ICD-10-CM

## 2023-06-13 DIAGNOSIS — I10 Essential (primary) hypertension: Secondary | ICD-10-CM

## 2023-06-13 LAB — BRAIN NATRIURETIC PEPTIDE: Pro B Natriuretic peptide (BNP): 873 pg/mL — ABNORMAL HIGH (ref 0.0–100.0)

## 2023-06-13 LAB — BASIC METABOLIC PANEL
BUN: 25 mg/dL — ABNORMAL HIGH (ref 6–23)
CO2: 25 meq/L (ref 19–32)
Calcium: 9.3 mg/dL (ref 8.4–10.5)
Chloride: 105 meq/L (ref 96–112)
Creatinine, Ser: 1.14 mg/dL (ref 0.40–1.20)
GFR: 45.41 mL/min — ABNORMAL LOW (ref >60.00)
Glucose, Bld: 88 mg/dL (ref 70–99)
Potassium: 3.6 meq/L (ref 3.5–5.1)
Sodium: 141 meq/L (ref 135–145)

## 2023-06-13 MED ORDER — FUROSEMIDE 20 MG PO TABS: 20.0000 mg | ORAL_TABLET | Freq: Every day | ORAL | 11 refills | Status: DC

## 2023-06-13 MED ORDER — AMIODARONE HCL 100 MG PO TABS: 100.0000 mg | ORAL_TABLET | Freq: Every day | ORAL | 3 refills | Status: DC

## 2023-06-13 MED ORDER — POTASSIUM CHLORIDE CRYS ER 10 MEQ PO TBCR: 10.0000 meq | EXTENDED_RELEASE_TABLET | Freq: Every day | ORAL | 3 refills | Status: AC

## 2023-06-13 NOTE — Patient Instructions (Signed)
 Medication Instructions:  Decrease Amiodarone 100 mg daily   Take Lasix 20 mg daily, with Potassium 10 MEQ daily   *If you need a refill on your cardiac medications before your next appointment, please call your pharmacy*   Testing/Procedures: Your physician has requested that you have an echocardiogram. Echocardiography is a painless test that uses sound waves to create images of your heart. It provides your doctor with information about the size and shape of your heart and how well your heart's chambers and valves are working.   You may receive an ultrasound enhancing agent through an IV if needed to better visualize your heart during the echo. This procedure takes approximately one hour.  There are no restrictions for this procedure.  This will take place at [address] [facility]) [address], [place] [address]  Please note: We ask at that you not bring children with you during ultrasound (echo/ vascular) testing. Due to room size and safety concerns, children are not allowed in the ultrasound rooms during exams. Our front office staff cannot provide observation of children in our lobby area while testing is being conducted. An adult accompanying a patient to their appointment will only be allowed in the ultrasound room at the discretion of the ultrasound technician under special circumstances. We apologize for any inconvenience.    Follow-Up: At [facility], you and your health needs are our priority.  As part of our continuing mission to provide you with exceptional heart care, we have created designated Provider Care Teams.  These Care Teams include your primary Cardiologist (physician) and Advanced Practice Providers (APPs -  Physician Assistants and Nurse Practitioners) who all work together to provide you with the care you need, when you need it.  We recommend signing up for the patient portal called "MyChart".  Sign up information is provided on this After  Visit Summary.  MyChart is used to connect with patients for Virtual Visits (Telemedicine).  Patients are able to view lab/test results, encounter notes, upcoming appointments, etc.  Non-urgent messages can be sent to your provider as well.   To learn more about what you can do with MyChart, go to [email or web address].    Your next appointment:   1 month(s)  Provider:   You may see Julien Nordmann, MD or one of the following Advanced Practice Providers on your designated Care Team:   Nicolasa Ducking, NP Eula Listen, PA-C Cadence Fransico Michael, PA-C Charlsie Quest, NP Carlos Levering, NP

## 2023-06-13 NOTE — Progress Notes (Signed)
 Cardiology Office Note:  .   Date:  06/16/2023  ID:  Karen Bowman, DOB 1940-07-28, MRN [id number] PCP: Excell Seltzer, MD  [facility] Providers Cardiologist:  Julien Nordmann, MD Cardiology APP:  Delma Freeze, FNP {    History of Present Illness: .   Karen Bowman is a 83 y.o. female with a h/o paroxysmal Afib, HLD, stroke March 2017 due to thrombosis of left middle cerebral artert, moderate to severe bilateral proximal PCA stenosis, mild right  cavernous and suprainclinoid ICA stenosis who presents for follow-up.   The patient was last seen 01/2023 and was in NSR on amiodarone 100mg  daily and metoprolol 12.5mg  daily. She is on Eliqus 2.5mg  BID for age and weight.   Saw PCP 3/6 for sinus infection and pleural effusions. She was in Afib and amiodarone was increased by Dr. Windell Hummingbird instructions.  Today, the patient reports she is improving, but still feels exhausted.  EKG shows SB, 54bpm with long Qtc . She is sleeping better.  She had lower leg edema during the illness.   Studies Reviewed: Marland Kitchen   EKG Interpretation Date/Time:  Friday June 13 2023 15:49:41 EDT Ventricular Rate:  54 PR Interval:  168 QRS Duration:  86 QT Interval:  646 QTC Calculation: 612 R Axis:   96  Text Interpretation: Sinus bradycardia Indeterminate axis T wave abnormality, consider anterior ischemia Prolonged QT When compared with ECG of 24-Feb-2023 14:22, Nonspecific T wave abnormality now evident in Inferior leads T wave inversion now evident in Anterior leads QT has lengthened Confirmed by Terrilee Croak ([id number]) on 06/16/2023 3:19:11 PM     Echo 2017 Study Conclusions   - Left ventricle: The cavity size was normal. Wall thickness was    increased in a pattern of mild LVH. Systolic function was normal.    The estimated ejection fraction was in the range of 55% to 60%.    Wall motion was normal; there were no regional wall motion    abnormalities.  - Mitral valve: Moderately calcified  annulus. Moderately thickened,    moderately calcified leaflets .  - Atrial septum: No defect or patent foramen ovale was identified.       Physical Exam:   VS:  BP (!) 142/69   Pulse (!) 55   Ht 5\' 3"  (1.6 m)   Wt 121 lb 6.4 oz (55.1 kg)   LMP  (LMP Unknown)   SpO2 94%   BMI 21.51 kg/m    Wt Readings from Last 3 Encounters:  06/13/23 121 lb 6.4 oz (55.1 kg)  06/12/23 123 lb (55.8 kg)  06/05/23 128 lb 6 oz (58.2 kg)    GEN: Well nourished, well developed in no acute distress NECK: No JVD; No carotid bruits CARDIAC: bradcyardia, RR, no murmurs, rubs, gallops RESPIRATORY:  Clear to auscultation without rales, wheezing or rhonchi  ABDOMEN: Soft, non-tender, non-distended EXTREMITIES:  mild to moderate lower leg edema; No deformity   ASSESSMENT AND PLAN: .    Acute on chronic diastolic heart failure Bilateral pleural effusions Yesterday CXR showed stable moderate right and small left pleural effusions with central pulmonary vascular congestion and BNP 873.she has only been taking lasix 20mg  three times weekly. I recommend she take Lasix 20mg  daily. She is in NSR today, hopefully will begin to self diuresis. I will check an echocardiogram.   Paroxysmal Afib She was on increased does of amiodarone per Dr. Windell Hummingbird instructions discussed with PCP in the setting of afib and sinus infection on  3/6.  Today, she is in SB with long Qtc . I will decrease amiodarone dose back to 100mg  daily. Continue Toprol 12.5mg  daily. Continue Eliquis 2.5mg  BID for storke ppx.   HTN BP today mildly elevated, in the setting of volume overload. Continue Toprol 12.5mg  daily.  Dispo: follow-up in 1 month  Signed, Sabeen Piechocki David Stall, PA-C

## 2023-06-20 ENCOUNTER — Ambulatory Visit
Admission: RE | Admit: 2023-06-20 | Discharge: 2023-06-20 | Disposition: A | Discharge status: Home / Self Care | Source: Ambulatory Visit | Attending: Family Medicine | Admitting: Family Medicine

## 2023-06-20 ENCOUNTER — Encounter: Payer: Self-pay | Admitting: Family Medicine

## 2023-06-20 DIAGNOSIS — Z780 Asymptomatic menopausal state: Secondary | ICD-10-CM | POA: Insufficient documentation

## 2023-06-20 DIAGNOSIS — Z13820 Encounter for screening for osteoporosis: Secondary | ICD-10-CM | POA: Diagnosis not present

## 2023-06-20 DIAGNOSIS — E348 Other specified endocrine disorders: Secondary | ICD-10-CM | POA: Diagnosis not present

## 2023-06-20 DIAGNOSIS — M810 Age-related osteoporosis without current pathological fracture: Secondary | ICD-10-CM | POA: Insufficient documentation

## 2023-07-02 ENCOUNTER — Telehealth: Payer: Self-pay | Admitting: *Deleted

## 2023-07-02 DIAGNOSIS — E038 Other specified hypothyroidism: Secondary | ICD-10-CM

## 2023-07-02 NOTE — Telephone Encounter (Signed)
-----   Message from Alvina Chou sent at 07/02/2023  2:45 PM EDT ----- Regarding: Lab orders for Thursday, 4.3.25 Lab orders for thyroid, thanks

## 2023-07-03 ENCOUNTER — Other Ambulatory Visit (INDEPENDENT_AMBULATORY_CARE_PROVIDER_SITE_OTHER)

## 2023-07-03 ENCOUNTER — Ambulatory Visit: Admitting: Family Medicine

## 2023-07-03 DIAGNOSIS — E038 Other specified hypothyroidism: Secondary | ICD-10-CM | POA: Diagnosis not present

## 2023-07-04 ENCOUNTER — Encounter: Payer: Self-pay | Admitting: Family Medicine

## 2023-07-04 LAB — T3, FREE: T3, Free: 3 pg/mL (ref 2.3–4.2)

## 2023-07-04 LAB — T4, FREE: Free T4: 0.78 ng/dL (ref 0.60–1.60)

## 2023-07-04 LAB — TSH: TSH: 4.77 u[IU]/mL (ref 0.35–5.50)

## 2023-07-14 ENCOUNTER — Other Ambulatory Visit
Admission: RE | Admit: 2023-07-14 | Discharge: 2023-07-14 | Disposition: A | Discharge status: Home / Self Care | Source: Ambulatory Visit | Attending: Cardiovascular Disease | Admitting: Cardiovascular Disease

## 2023-07-14 ENCOUNTER — Encounter: Payer: Self-pay | Admitting: Emergency Medicine

## 2023-07-14 ENCOUNTER — Ambulatory Visit: Attending: Medical

## 2023-07-14 ENCOUNTER — Other Ambulatory Visit: Payer: Self-pay

## 2023-07-14 ENCOUNTER — Telehealth: Payer: Self-pay | Admitting: Emergency Medicine

## 2023-07-14 ENCOUNTER — Other Ambulatory Visit: Payer: Self-pay | Admitting: Medical

## 2023-07-14 ENCOUNTER — Other Ambulatory Visit: Payer: Self-pay | Admitting: Cardiovascular Disease

## 2023-07-14 DIAGNOSIS — I480 Paroxysmal atrial fibrillation: Secondary | ICD-10-CM

## 2023-07-14 DIAGNOSIS — I10 Essential (primary) hypertension: Secondary | ICD-10-CM

## 2023-07-14 DIAGNOSIS — J90 Pleural effusion, not elsewhere classified: Secondary | ICD-10-CM

## 2023-07-14 DIAGNOSIS — Z79899 Other long term (current) drug therapy: Secondary | ICD-10-CM | POA: Insufficient documentation

## 2023-07-14 DIAGNOSIS — R600 Localized edema: Secondary | ICD-10-CM

## 2023-07-14 DIAGNOSIS — I5031 Acute diastolic (congestive) heart failure: Secondary | ICD-10-CM

## 2023-07-14 LAB — CBC
HCT: 37.9 % (ref 36.0–46.0)
Hemoglobin: 12.5 g/dL (ref 12.0–15.0)
MCH: 29.2 pg (ref 26.0–34.0)
MCHC: 33 g/dL (ref 30.0–36.0)
MCV: 88.6 fL (ref 80.0–100.0)
Platelets: 157 10*3/uL (ref 150–400)
RBC: 4.28 MIL/uL (ref 3.87–5.11)
RDW: 17.9 % — ABNORMAL HIGH (ref 11.5–15.5)
WBC: 5.6 10*3/uL (ref 4.0–10.5)
nRBC: 0.4 % — ABNORMAL HIGH (ref 0.0–0.2)

## 2023-07-14 LAB — BASIC METABOLIC PANEL WITH GFR
Anion gap: 8 (ref 5–15)
BUN: 26 mg/dL — ABNORMAL HIGH (ref 8–23)
CO2: 23 mmol/L (ref 22–32)
Calcium: 8.6 mg/dL — ABNORMAL LOW (ref 8.9–10.3)
Chloride: 108 mmol/L (ref 98–111)
Creatinine, Ser: 1.25 mg/dL — ABNORMAL HIGH (ref 0.44–1.00)
GFR, Estimated: 44 mL/min — ABNORMAL LOW (ref >60)
Glucose, Bld: 105 mg/dL — ABNORMAL HIGH (ref 70–99)
Potassium: 4 mmol/L (ref 3.5–5.1)
Sodium: 139 mmol/L (ref 135–145)

## 2023-07-14 MED ORDER — METOPROLOL SUCCINATE ER 25 MG PO TB24: 25.0000 mg | ORAL_TABLET | Freq: Every day | ORAL | Status: DC

## 2023-07-14 MED ORDER — AMIODARONE HCL 200 MG PO TABS
200.0000 mg | ORAL_TABLET | Freq: Every day | ORAL | Status: DC
Start: 2023-07-14 — End: 2023-09-19

## 2023-07-14 NOTE — Telephone Encounter (Signed)
 Patient here for Echocardiogram. Patient with elevated heart rate. Per orders from Dr. Jerelene Monday EKG obtained. Patient in atrial fibrillation with RVR. Patient scheduled for cardioversion on 07/15/23 with Dr. Gollan.

## 2023-07-15 ENCOUNTER — Other Ambulatory Visit: Payer: Self-pay

## 2023-07-15 ENCOUNTER — Ambulatory Visit
Admission: RE | Admit: 2023-07-15 | Discharge: 2023-07-15 | Disposition: A | Discharge status: Home / Self Care | Source: Ambulatory Visit | Attending: Cardiovascular Disease | Admitting: Cardiovascular Disease

## 2023-07-15 ENCOUNTER — Encounter
Admission: RE | Disposition: A | Discharge status: Home / Self Care | Payer: Self-pay | Source: Ambulatory Visit | Attending: Cardiovascular Disease

## 2023-07-15 ENCOUNTER — Ambulatory Visit: Admitting: Registered Nurse

## 2023-07-15 DIAGNOSIS — F419 Anxiety disorder, unspecified: Secondary | ICD-10-CM | POA: Diagnosis not present

## 2023-07-15 DIAGNOSIS — I110 Hypertensive heart disease with heart failure: Secondary | ICD-10-CM | POA: Insufficient documentation

## 2023-07-15 DIAGNOSIS — E785 Hyperlipidemia, unspecified: Secondary | ICD-10-CM | POA: Insufficient documentation

## 2023-07-15 DIAGNOSIS — E039 Hypothyroidism, unspecified: Secondary | ICD-10-CM | POA: Diagnosis not present

## 2023-07-15 DIAGNOSIS — I480 Paroxysmal atrial fibrillation: Secondary | ICD-10-CM

## 2023-07-15 DIAGNOSIS — Z8673 Personal history of transient ischemic attack (TIA), and cerebral infarction without residual deficits: Secondary | ICD-10-CM | POA: Insufficient documentation

## 2023-07-15 DIAGNOSIS — Z79890 Hormone replacement therapy (postmenopausal): Secondary | ICD-10-CM | POA: Insufficient documentation

## 2023-07-15 DIAGNOSIS — Z79899 Other long term (current) drug therapy: Secondary | ICD-10-CM | POA: Diagnosis not present

## 2023-07-15 DIAGNOSIS — N189 Chronic kidney disease, unspecified: Secondary | ICD-10-CM | POA: Diagnosis not present

## 2023-07-15 DIAGNOSIS — Z7901 Long term (current) use of anticoagulants: Secondary | ICD-10-CM | POA: Insufficient documentation

## 2023-07-15 DIAGNOSIS — M1990 Unspecified osteoarthritis, unspecified site: Secondary | ICD-10-CM | POA: Insufficient documentation

## 2023-07-15 DIAGNOSIS — I130 Hypertensive heart and chronic kidney disease with heart failure and stage 1 through stage 4 chronic kidney disease, or unspecified chronic kidney disease: Secondary | ICD-10-CM | POA: Diagnosis not present

## 2023-07-15 DIAGNOSIS — I509 Heart failure, unspecified: Secondary | ICD-10-CM | POA: Insufficient documentation

## 2023-07-15 DIAGNOSIS — I5022 Chronic systolic (congestive) heart failure: Secondary | ICD-10-CM | POA: Diagnosis not present

## 2023-07-15 DIAGNOSIS — I4891 Unspecified atrial fibrillation: Secondary | ICD-10-CM | POA: Diagnosis not present

## 2023-07-15 LAB — ECHOCARDIOGRAM LIMITED

## 2023-07-15 SURGERY — CARDIOVERSION
Anesthesia: General

## 2023-07-15 MED ORDER — SODIUM CHLORIDE 0.9 % IV SOLN: INTRAVENOUS | Status: DC | PRN

## 2023-07-15 MED ORDER — SODIUM CHLORIDE 0.9 % IV SOLN: INTRAVENOUS | Status: DC

## 2023-07-15 MED ORDER — LIDOCAINE HCL (PF) 2 % IJ SOLN
INTRAMUSCULAR | Status: DC | PRN
Start: 2023-07-15 — End: 2023-07-15
  Administered 2023-07-15: 60 mg via INTRADERMAL

## 2023-07-15 MED ORDER — PROPOFOL 10 MG/ML IV BOLUS
INTRAVENOUS | Status: DC | PRN
  Administered 2023-07-15: 60 mg via INTRAVENOUS

## 2023-07-15 NOTE — Anesthesia Preprocedure Evaluation (Addendum)
 Anesthesia Evaluation  Patient identified by MRN, date of birth, ID band Patient awake    Reviewed: Allergy & Precautions, H&P , NPO status , Patient's Chart, lab work & pertinent test results  Airway Mallampati: II  TM Distance: >3 FB Neck ROM: full    Dental no notable dental hx.    Pulmonary shortness of breath   Pulmonary exam normal        Cardiovascular hypertension, + Peripheral Vascular Disease and +CHF  + dysrhythmias Atrial Fibrillation (-) pacemaker+ Valvular Problems/Murmurs  Rhythm:Irregular Rate:Tachycardia - Systolic murmurs, - Diastolic murmurs and - Peripheral Edema    Neuro/Psych  PSYCHIATRIC DISORDERS Anxiety     CVA (2017), No Residual Symptoms    GI/Hepatic negative GI ROS, Neg liver ROS,,,  Endo/Other  Hypothyroidism    Renal/GU Renal InsufficiencyRenal disease  negative genitourinary   Musculoskeletal  (+) Arthritis ,    Abdominal Normal abdominal exam  (+)   Peds  Hematology negative hematology ROS (+)   Anesthesia Other Findings Past Medical History: No date: Acute heart failure (HCC) No date: Anxiety No date: Arrhythmia No date: Arthritis No date: Atrial fibrillation (HCC) 11/19/2021: Basal cell carcinoma     Comment:  left forehead above brow - ED&C No date: Heart murmur No date: Hyperlipemia No date: Hypertension No date: Stroke [facility])  Past Surgical History: No date: ABDOMINAL HYSTERECTOMY No date: APPENDECTOMY No date: BLADDER SUSPENSION 10/18/2014: ELECTROPHYSIOLOGIC STUDY; N/A     Comment:  Procedure: CARDIOVERSION;  Surgeon: Lake Pilgrim, MD;              Location: [facility] ORS;  Service: Cardiovascular;                Laterality: N/A; 10/18/2014: TEE WITHOUT CARDIOVERSION; N/A     Comment:  Procedure: TRANSESOPHAGEAL ECHOCARDIOGRAM (TEE);                Surgeon: Lake Pilgrim, MD;  Location: [facility] ORS;                Service: Cardiovascular;  Laterality: N/A; No date:  TONSILLECTOMY No date: TUMOR REMOVAL     Comment:  right arm  BMI    Body Mass Index: 21.61 kg/m      Reproductive/Obstetrics negative OB ROS                             Anesthesia Physical Anesthesia Plan  ASA: 3  Anesthesia Plan: General   Post-op Pain Management: Minimal or no pain anticipated   Induction: Intravenous  PONV Risk Score and Plan: Propofol infusion and TIVA  Airway Management Planned: Natural Airway  Additional Equipment:   Intra-op Plan:   Post-operative Plan:   Informed Consent: I have reviewed the patients History and Physical, chart, labs and discussed the procedure including the risks, benefits and alternatives for the proposed anesthesia with the patient or authorized representative who has indicated his/her understanding and acceptance.     Dental Advisory Given  Plan Discussed with: CRNA and Surgeon  Anesthesia Plan Comments:         Anesthesia Quick Evaluation

## 2023-07-15 NOTE — CV Procedure (Signed)
 Cardioversion procedure note For atrial fibrillation, persistent with RVR.  Procedure Details:  Consent: Risks of procedure as well as the alternatives and risks of each were explained to the (patient/caregiver).  Consent for procedure obtained.  Time Out: Verified patient identification, verified procedure, site/side was marked, verified correct patient position, special equipment/implants available, medications/allergies/relevent history reviewed, required imaging and test results available.  Performed  Patient placed on cardiac monitor, pulse oximetry, supplemental oxygen as necessary.   Sedation given: propofol IV, Dr. Lincoln Renshaw Pacer pads placed anterior and posterior chest.   Cardioverted 1 time(s).   Cardioverted at  120  J. Synchronized biphasic Converted to sinus bradycardia rates in the 30s slowly improving up to rate of 60 bpm after 1 minute,  NSR   Evaluation: Findings: Post procedure EKG shows: NSR Complications: None Patient did tolerate procedure well.  Time Spent Directly with the Patient:  45 minutes   Tim Aeliana Spates, M.D., Ph.D.

## 2023-07-15 NOTE — Anesthesia Postprocedure Evaluation (Signed)
 Anesthesia Post Note  Patient: Stanley G Agostinelli  Procedure(s) Performed: CARDIOVERSION  Patient location during evaluation: PACU Anesthesia Type: General Level of consciousness: awake and alert Pain management: pain level controlled Vital Signs Assessment: post-procedure vital signs reviewed and stable Respiratory status: spontaneous breathing, nonlabored ventilation and respiratory function stable Cardiovascular status: blood pressure returned to baseline and stable Postop Assessment: no apparent nausea or vomiting Anesthetic complications: no   No notable events documented.   Last Vitals:  Vitals:   07/15/23 0757 07/15/23 0758  BP:  106/64  Pulse: (!) 51 (!) 50  Resp: 16   Temp:    SpO2: 98% 98%    Last Pain:  Vitals:   07/15/23 0758  TempSrc:   PainSc: 0-No pain                 Baltazar Bonier

## 2023-07-15 NOTE — Transfer of Care (Signed)
 Immediate Anesthesia Transfer of Care Note  Patient: Karen Bowman  Procedure(s) Performed: CARDIOVERSION  Patient Location: Short Stay  Anesthesia Type:General  Level of Consciousness: drowsy and patient cooperative  Airway & Oxygen Therapy: Patient Spontanous Breathing and Patient connected to nasal cannula oxygen  Post-op Assessment: Report given to RN and Post -op Vital signs reviewed and stable  Post vital signs: Reviewed and stable  Last Vitals:  Vitals Value Taken Time  BP 106/64 07/15/23 0758  Temp    Pulse 49 07/15/23 0758  Resp 15 07/15/23 0758  SpO2 98 % 07/15/23 0758  Vitals shown include unfiled device data.  Last Pain:  Vitals:   07/15/23 0758  TempSrc:   PainSc: 0-No pain         Complications: No notable events documented.

## 2023-07-15 NOTE — Anesthesia Procedure Notes (Signed)
 Procedure Name: MAC Date/Time: 07/15/2023 7:36 AM  Performed by: Marisue Side, CRNAPre-anesthesia Checklist: Patient identified, Emergency Drugs available, Suction available and Patient being monitored Patient Re-evaluated:Patient Re-evaluated prior to induction Oxygen Delivery Method: Nasal cannula Preoxygenation: Pre-oxygenation with 100% oxygen Induction Type: IV induction

## 2023-07-15 NOTE — H&P (Signed)
 Date:  07/15/23   ID:  Karen Bowman, DOB [birth year], MRN [id number]   PCP:  Excell Seltzer, MD           Chief Complaint  Patient presents with   Tachycardia           HPI:  83 year old woman with history of  paroxysmal atrial fibrillation,  hospitalization July 5 with discharge 10/20/2014 for atrial fibrillation,  discharged on amiodarone, anticoagulation, digoxin, metoprolol.  hyperlipidemia stroke March 2017, still right leg weakness Moderate to severe bilateral proximal PCA stenoses. Mild right cavernous and supraclinoid ICA stenosis. She presents today for echocardiogram, noted to be in atrial fibrillation with RVR rate 130 up to 150  Seen briefly in clinic, she reports fatigue, not feeling right for the past month or 2 Seen recently in a clinic 1 month ago amiodarone decreased down to 100 daily, metoprolol succinate 12.5 daily as she had bradycardia, prolonged QT  Some problems with sinus infection        EKG personally reviewed by myself on todays visit EKG Interpretation July 14, 2023, Text Interpretation: Atrial fibrillation rate 130 bpm   Other past medical history reviewed Seen by PMD 05/28/22: atrial fib/fluuter, rate 130s TSH 22 Given amiodarone load 400 twice daily for 1 week down to 200 twice daily     Recent Labs       Lab Results  Component Value Date    CHOL 125 05/21/2022    HDL 46.30 05/21/2022    LDLCALC 63 05/21/2022    TRIG 80.0 05/21/2022        Other past medical hx Echo 05/2015: EF 55 to 60%   Neck CT  05/2015 IMPRESSION: 1. Moderate to severe bilateral proximal PCA stenoses. 2. Mild right cavernous and supraclinoid ICA stenosis.   Hospital records were reviewed from March 2017, right side deficits upper extremity, lower extremity, speech deficits TPA was not given CT scan of the head and neck documenting diffuse PAD Discharged with eliquis and aspirin 81 mg daily She has completed rehabilitation, has been home for  approximately one month, Reports improving deficits, able to floss her teeth, brush her hair,  graduated to a cane from a walker Denies any tachycardia or palpitations concerning for arrhythmia   Notes indicate TEE with cardioversion 10/18/2014 which was unsuccessful     PMH:   has a past medical history of Acute heart failure (HCC), Anxiety, Arrhythmia, Arthritis, Atrial fibrillation (HCC), Basal cell carcinoma (11/19/2021), Heart murmur, Hyperlipemia, Hypertension, and Stroke (HCC).   PSH:         Past Surgical History:  Procedure Laterality Date   ABDOMINAL HYSTERECTOMY       APPENDECTOMY       BLADDER SUSPENSION       ELECTROPHYSIOLOGIC STUDY N/A 10/18/2014    Procedure: CARDIOVERSION;  Surgeon: Vesta Mixer, MD;  Location: [facility] ORS;  Service: Cardiovascular;  Laterality: N/A;   TEE WITHOUT CARDIOVERSION N/A 10/18/2014    Procedure: TRANSESOPHAGEAL ECHOCARDIOGRAM (TEE);  Surgeon: Vesta Mixer, MD;  Location: [facility] ORS;  Service: Cardiovascular;  Laterality: N/A;   TONSILLECTOMY       TUMOR REMOVAL        right arm          No current facility-administered medications on file prior to encounter.   Current Outpatient Medications on File Prior to Encounter  Medication Sig Dispense Refill   amiodarone (PACERONE) 100 MG tablet Take 1 tablet (100 mg total) by mouth daily. 90 tablet  3   amiodarone (PACERONE) 200 MG tablet Take 1 tablet (200 mg total) by mouth daily.     amLODipine (NORVASC) 2.5 MG tablet TAKE 1 TABLET BY MOUTH EVERY DAY 90 tablet 0   apixaban (ELIQUIS) 2.5 MG TABS tablet TAKE 1 TABLET BY MOUTH TWICE A DAY 180 tablet 1   atorvastatin (LIPITOR) 40 MG tablet TAKE 1 TABLET BY MOUTH DAILY AT 6 PM. 90 tablet 1   Cholecalciferol 1000 UNITS capsule Take 2,000 Units by mouth daily.      CINNAMON PO Take 1 tablet by mouth daily.     Coenzyme Q10-Fish Oil-Vit E (CO-Q 10 OMEGA-3 FISH OIL) CAPS Take 1 capsule by mouth 2 (two) times daily.     conjugated estrogens  (PREMARIN) vaginal cream Apply 0.5mg  (pea-sized amount)  just inside the vaginal introitus with a finger-tip on  Monday, Wednesday and Friday nights. 30 g 12   CRANBERRY PO Take 1 tablet by mouth 2 (two) times daily.      doxazosin (CARDURA) 1 MG tablet Take 1 mg by mouth 2 (two) times daily.     ezetimibe (ZETIA) 10 MG tablet TAKE 1 TABLET BY MOUTH EVERY DAY 90 tablet 0   furosemide (LASIX) 20 MG tablet Take 1 tablet (20 mg total) by mouth daily. Increase for 1 week to daily then return to three days a week 30 tablet 11   levothyroxine (SYNTHROID) 50 MCG tablet Take 1 tablet (50 mcg total) by mouth daily. 30 tablet 11   MAGNESIUM PO Take by mouth daily.     metoprolol succinate (TOPROL-XL) 25 MG 24 hr tablet Take 1 tablet (25 mg total) by mouth daily.     MISC NATURAL PRODUCTS PO Take 1 tablet by mouth daily. Multivitamin-Dr. Mel Almond     NON FORMULARY Cell Rebuild Takes 1 tablet BID.     NON FORMULARY Canaflex 1 tablet daily.     NON FORMULARY Epivery Takes 1 tablet bid.     NON FORMULARY Limbex take one tablet daily     NON FORMULARY Sanguenol nitric oxide one tablet daily     NON FORMULARY Reprieve one tablet daily     OVER THE COUNTER MEDICATION Take 1,040 mg by mouth daily. Dandelion     potassium chloride SA (KLOR-CON M) 10 MEQ tablet Take 1 tablet (10 mEq total) by mouth daily. 90 tablet 3   TURMERIC PO Take by mouth 2 (two) times daily.     amoxicillin-clavulanate (AUGMENTIN) 875-125 MG tablet Take 1 tablet by mouth 2 (two) times daily. (Patient not taking: Reported on 07/15/2023) 20 tablet 0   LYSINE PO Take by mouth daily. (Patient not taking: Reported on 07/15/2023)     mupirocin cream (BACTROBAN) 2 % Apply 1 application topically 2 (two) times daily. (Patient not taking: Reported on 07/15/2023) 15 g 0   VITAMIN E PO Take by mouth daily. (Patient not taking: Reported on 07/15/2023)        Allergies:   Ciprofloxacin, Ciprofloxacin, Hydrochlorothiazide, Hydrochlorothiazide  w-triamterene, Sulfa antibiotics, Sulfonamide derivatives, Valsartan, and Valsartan    Social History:  The patient  reports that she has never smoked. She has never used smokeless tobacco. She reports that she does not drink alcohol and does not use drugs.    Family History:   family history includes Cancer in her father; Diabetes in her maternal aunt; Heart disease in her mother; Osteoporosis in her mother; Stroke in her mother.      Review of Systems: Review of Systems  Constitutional:  Positive for malaise/fatigue.  HENT: Negative.    Respiratory: Negative.    Cardiovascular: Negative.   Gastrointestinal: Negative.   Musculoskeletal: Negative.   Neurological: Negative.   Psychiatric/Behavioral: Negative.    All other systems reviewed and are negative.   PHYSICAL EXAM: VS:  BP (!) 140/60 (BP Location: Left Arm, Patient Position: Sitting, Cuff Size: Normal)   Pulse (!) 59   Ht 5\' 4"  (1.626 m)   Wt 121 lb 4 oz (55 kg)   LMP  (LMP Unknown)   SpO2 95%   BMI 20.81 kg/m  , BMI Body mass index is 20.81 kg/m. Constitutional:  oriented to person, place, and time. No distress.  HENT:  Head: Grossly normal Eyes:  no discharge. No scleral icterus.  Neck: No JVD, no carotid bruits  Cardiovascular: Irregularly irregular, no murmurs appreciated Pulmonary/Chest: Clear to auscultation bilaterally, no wheezes or rails Abdominal: Soft.  no distension.  no tenderness.  Musculoskeletal: Normal range of motion Neurological:  normal muscle tone. Coordination normal. No atrophy Skin: Skin warm and dry Psychiatric: normal affect, pleasant     Recent Labs: 05/28/2022: ALT 95; Hemoglobin 14.8; Platelets 168.0; Pro B Natriuretic peptide (BNP) 1,300.0 07/05/2022: BUN 22; Creatinine, Ser 0.94; Potassium 4.1; Sodium 141 08/21/2022: TSH 4.22      Lipid Panel Recent Labs       Lab Results  Component Value Date    CHOL 125 05/21/2022    HDL 46.30 05/21/2022    LDLCALC 63 05/21/2022    TRIG 80.0  05/21/2022           Wt Readings from Last 3 Encounters:  02/24/23 121 lb 4 oz (55 kg)  08/20/22 116 lb 6 oz (52.8 kg)  08/07/22 116 lb (52.6 kg)        ASSESSMENT AND PLAN:   Paroxysmal atrial fibrillation (HCC) - Plan: EKG 12-Lead She has converted to atrial fibrillation with RVR, timing unclear, since last month when she was seen in clinic at that time normal sinus rhythm Recommend cardioversion, scheduled for tomorrow, risk and benefit discussed with her detail She reports that she has been compliant with her Eliquis Recommend she increase amiodarone back up to 200 daily, metoprolol succinate 25 daily Follow-up with the EP   Leg edema No significant leg edema, on Lasix 20 mg 3 days a week   HTN Blood pressure stable, orthostatics negative   Fall No recent falls   HYPERLIPIDEMIA Continue cholesterol medication   Cerebrovascular accident (CVA) due to thrombosis of left middle cerebral artery (HCC) Prior history of stroke, paroxysmal atrial fibrillation Known cerebrovascular disease Continue reduced dose Eliquis 2.5 twice daily age, weight For frequent falls may need to hold Eliquis   Gait instability On last clinic visit we mentioned gait instability and need for help at home She reports sometimes son goes away for several days at a time to drive Recommended to son who presents with her today (not the one that lives with her) to consider life alert   Adjustment disorder Loss of husband 2022, lives with son  Signed, Juanda Noon, MD, Ph.D [facility]

## 2023-07-16 ENCOUNTER — Encounter: Payer: Self-pay | Admitting: Cardiovascular Disease

## 2023-07-17 ENCOUNTER — Other Ambulatory Visit: Payer: Self-pay | Admitting: Family Medicine

## 2023-07-25 ENCOUNTER — Encounter: Payer: Self-pay | Admitting: Cardiovascular Disease

## 2023-07-25 ENCOUNTER — Ambulatory Visit: Payer: Medicare Other

## 2023-07-25 VITALS — Ht 63.0 in | Wt 122.0 lb

## 2023-07-25 DIAGNOSIS — Z0000 Encounter for general adult medical examination without abnormal findings: Secondary | ICD-10-CM | POA: Diagnosis not present

## 2023-07-25 NOTE — Patient Instructions (Signed)
 Karen Bowman , Thank you for taking time to come for your Medicare Wellness Visit. I appreciate your ongoing commitment to your health goals. Please review the following plan we discussed and let me know if I can assist you in the future.   Referrals/Orders/Follow-Ups/Clinician Recommendations: none  This is a list of the screening recommended for you and due dates:  Health Maintenance  Topic Date Due   Pneumonia Vaccine (1 of 2 - PCV) Never done   Zoster (Shingles) Vaccine (1 of 2) Never done   DTaP/Tdap/Td vaccine (2 - Tdap) 07/14/2018   COVID-19 Vaccine (3 - Moderna risk series) 12/19/2019   Flu Shot  10/31/2023   Medicare Annual Wellness Visit  07/24/2024   DEXA scan (bone density measurement)  Completed   HPV Vaccine  Aged Out   Meningitis B Vaccine  Aged Out   Colon Cancer Screening  Discontinued   Hepatitis C Screening  Discontinued    Advanced directives: (Copy Requested) Please bring a copy of your health care power of attorney and living will to the office to be added to your chart at your convenience. You can mail to [facility] [address] [place], [place] [address] or email to [email or web address]  Next Medicare Annual Wellness Visit scheduled for next year: Yes 07/26/24 @ 3pm televisit

## 2023-07-25 NOTE — Progress Notes (Signed)
 Subjective:   Karen Bowman is a 83 y.o. who presents for a Medicare Wellness preventive visit.  Visit Complete: Virtual I connected with  Karen Bowman on 07/25/23 by a audio enabled telemedicine application and verified that I am speaking with the correct person using two identifiers.  Patient Location: Home  Provider Location: Office/Clinic  I discussed the limitations of evaluation and management by telemedicine. The patient expressed understanding and agreed to proceed.  Vital Signs: Because this visit was a virtual/telehealth visit, some criteria may be missing or patient reported. Any vitals not documented were not able to be obtained and vitals that have been documented are patient reported.  VideoDeclined- This patient declined Librarian, academic. Therefore the visit was completed with audio only.  Persons Participating in Visit: Patient.  AWV Questionnaire: No: Patient Medicare AWV questionnaire was not completed prior to this visit.  Cardiac Risk Factors include: advanced age (>2men, >22 women);hypertension;dyslipidemia;sedentary lifestyle     Objective:    Today's Vitals   07/25/23 1326  Weight: 122 lb (55.3 kg)  Height: 5\' 3"  (1.6 m)  PainSc: 0-No pain   Body mass index is 21.61 kg/m.     07/25/2023    1:49 PM 05/21/2022    2:03 PM 07/08/2017    9:29 AM 12/05/2016    1:35 PM 10/24/2016   11:56 AM 05/31/2016   12:28 PM 11/21/2015    1:59 PM  Advanced Directives  Does Patient Have a Medical Advance Directive? Yes No Yes Yes Yes Yes Yes  Type of Estate agent of [place];Living will  Healthcare Power of [place];Living will Healthcare Power of [place];Living will Healthcare Power of [employer] of [place];Living will   Copy of Healthcare Power of Attorney in Chart? No - copy requested  No - copy requested   No - copy requested   Would patient like information on creating a medical advance  directive?  No - Patient declined         Current Medications (verified) Outpatient Encounter Medications as of 07/25/2023  Medication Sig   amiodarone  (PACERONE ) 100 MG tablet Take 1 tablet (100 mg total) by mouth daily.   amiodarone  (PACERONE ) 200 MG tablet Take 1 tablet (200 mg total) by mouth daily.   amLODipine  (NORVASC ) 2.5 MG tablet TAKE 1 TABLET BY MOUTH EVERY DAY   apixaban  (ELIQUIS ) 2.5 MG TABS tablet TAKE 1 TABLET BY MOUTH TWICE A DAY   atorvastatin  (LIPITOR ) 40 MG tablet TAKE 1 TABLET BY MOUTH DAILY AT 6 PM.   Cholecalciferol 1000 UNITS capsule Take 2,000 Units by mouth daily.    CINNAMON PO Take 1 tablet by mouth daily.   Coenzyme Q10-Fish Oil-Vit E (CO-Q 10 OMEGA-3 FISH OIL) CAPS Take 1 capsule by mouth 2 (two) times daily.   conjugated estrogens  (PREMARIN ) vaginal cream Apply 0.5mg  (pea-sized amount)  just inside the vaginal introitus with a finger-tip on  Monday, Wednesday and Friday nights.   CRANBERRY PO Take 1 tablet by mouth 2 (two) times daily.    doxazosin  (CARDURA ) 1 MG tablet Take 1 mg by mouth 2 (two) times daily.   ezetimibe  (ZETIA ) 10 MG tablet TAKE 1 TABLET BY MOUTH EVERY DAY   furosemide  (LASIX ) 20 MG tablet Take 1 tablet (20 mg total) by mouth daily. Increase for 1 week to daily then return to three days a week   levothyroxine  (SYNTHROID ) 50 MCG tablet TAKE 1 TABLET BY MOUTH EVERY DAY   LYSINE PO Take by  mouth daily.   MAGNESIUM PO Take by mouth daily.   metoprolol  succinate (TOPROL -XL) 25 MG 24 hr tablet Take 1 tablet (25 mg total) by mouth daily.   MISC NATURAL PRODUCTS PO Take 1 tablet by mouth daily. Multivitamin-Dr. Sinatra   NON FORMULARY Cell Rebuild Takes 1 tablet BID.   NON FORMULARY Canaflex 1 tablet daily.   NON FORMULARY Epivery Takes 1 tablet bid.   NON FORMULARY Limbex take one tablet daily   NON FORMULARY Sanguenol nitric oxide one tablet daily   NON FORMULARY Reprieve one tablet daily   OVER THE COUNTER MEDICATION Take 1,040 mg by mouth  daily. Dandelion   potassium chloride  SA (KLOR-CON  M) 10 MEQ tablet Take 1 tablet (10 mEq total) by mouth daily.   TURMERIC PO Take by mouth 2 (two) times daily.   amoxicillin -clavulanate (AUGMENTIN ) 875-125 MG tablet Take 1 tablet by mouth 2 (two) times daily. (Patient not taking: Reported on 07/15/2023)   mupirocin  cream (BACTROBAN ) 2 % Apply 1 application topically 2 (two) times daily. (Patient not taking: Reported on 07/15/2023)   VITAMIN E PO Take by mouth daily. (Patient not taking: Reported on 07/15/2023)   No facility-administered encounter medications on file as of 07/25/2023.    Allergies (verified) Ciprofloxacin, Hydrochlorothiazide, Sulfonamide derivatives, and Valsartan   History: Past Medical History:  Diagnosis Date   Acute heart failure (HCC)    Anxiety    Arrhythmia    Arthritis    Atrial fibrillation (HCC)    Basal cell carcinoma 11/19/2021   left forehead above brow - ED&C   Heart murmur    Hyperlipemia    Hypertension    Stroke [facility])    Past Surgical History:  Procedure Laterality Date   ABDOMINAL HYSTERECTOMY     APPENDECTOMY     BLADDER SUSPENSION     CARDIOVERSION N/A 07/15/2023   Procedure: CARDIOVERSION;  Surgeon: Devorah Fonder, MD;  Location: [facility] ORS;  Service: Cardiovascular;  Laterality: N/A;   ELECTROPHYSIOLOGIC STUDY N/A 10/18/2014   Procedure: CARDIOVERSION;  Surgeon: Lake Pilgrim, MD;  Location: [facility] ORS;  Service: Cardiovascular;  Laterality: N/A;   TEE WITHOUT CARDIOVERSION N/A 10/18/2014   Procedure: TRANSESOPHAGEAL ECHOCARDIOGRAM (TEE);  Surgeon: [place] Pilgrim, MD;  Location: [facility] ORS;  Service: Cardiovascular;  Laterality: N/A;   TONSILLECTOMY     TUMOR REMOVAL     right arm   Family History  Problem Relation Age of Onset   Osteoporosis Mother    Heart disease Mother    Stroke Mother    Cancer Father    Diabetes Maternal Aunt    Social History   Socioeconomic History   Marital status: Married    Spouse name: Not on file    Number of children: 2   Years of education: Not on file   Highest education level: Not on file  Occupational History   Occupation: retired Diplomatic Services operational officer  Tobacco Use   Smoking status: Never   Smokeless tobacco: Never  Vaping Use   Vaping status: Never Used  Substance and Sexual Activity   Alcohol use: No   Drug use: No   Sexual activity: Not Currently  Other Topics Concern   Not on file  Social History Narrative   ** Merged History Encounter **       Exercise:walking 2-3 times a week   End of life issues: Has living will.. Husband is HCPOA. Full code (reviewed 2014)   Social Drivers of Corporate investment banker Strain: Low Risk  (  07/25/2023)   Overall Financial Resource Strain (CARDIA)    Difficulty of Paying Living Expenses: Not hard at all  Food Insecurity: No Food Insecurity (07/25/2023)   Hunger Vital Sign    Worried About Running Out of Food in the Last Year: Never true    Ran Out of Food in the Last Year: Never true  Transportation Needs: No Transportation Needs (07/25/2023)   PRAPARE - Administrator, Civil Service (Medical): No    Lack of Transportation (Non-Medical): No  Physical Activity: Inactive (07/25/2023)   Exercise Vital Sign    Days of Exercise per Week: 0 days    Minutes of Exercise per Session: 0 min  Stress: No Stress Concern Present (07/25/2023)   [employer] of Occupational Health - Occupational Stress Questionnaire    Feeling of Stress : Only a little  Social Connections: Socially Isolated (07/25/2023)   Social Connection and Isolation Panel [NHANES]    Frequency of Communication with Friends and Family: Twice a week    Frequency of Social Gatherings with Friends and Family: Twice a week    Attends Religious Services: Never    Database administrator or Organizations: No    Attends Banker Meetings: Never    Marital Status: Widowed    Tobacco Counseling Counseling given: Not Answered    Clinical Intake:  Pre-visit  preparation completed: Yes  Pain : No/denies pain Pain Score: 0-No pain     BMI - recorded: 21.61 Nutritional Risks: None Diabetes: No  Lab Results  Component Value Date   HGBA1C 5.7 (H) 06/28/2015   HGBA1C 5.7 10/15/2014     How often do you need to have someone help you when you read instructions, pamphlets, or other written materials from your doctor or pharmacy?: 1 - Never  Interpreter Needed?: No  Comments: lives with son Information entered by :: B.Evanee Lubrano,LPN   Activities of Daily Living     07/25/2023    1:49 PM 07/15/2023    7:22 AM  In your present state of health, do you have any difficulty performing the following activities:  Hearing? 1 1  Vision? 0 0  Difficulty concentrating or making decisions? 0 0  Walking or climbing stairs? 1   Dressing or bathing? 0   Doing errands, shopping? 1   Preparing Food and eating ? N   Using the Toilet? N   In the past six months, have you accidently leaked urine? Y   Do you have problems with loss of bowel control? N   Managing your Medications? N   Managing your Finances? N   Housekeeping or managing your Housekeeping? Y     Patient Care Team: Judithann Novas, MD as PCP - General (Family Medicine) Devorah Fonder, MD as PCP - Cardiology (Cardiology) Judithann Novas, MD Charlette Console, FNP as Nurse Practitioner (Cardiology) Devorah Fonder, MD as Consulting Physician (Cardiology) Alta Ast, [facility] (Inactive) as Pharmacist (Pharmacist)  Indicate any recent Medical Services you may have received from other than [facility] providers in the past year (date may be approximate).     Assessment:   This is a routine wellness examination for Karen Bowman.  Hearing/Vision screen Hearing Screening - Comments:: Pt says her hearing is not good but does not want audiology referral  Vision Screening - Comments:: Pt says her vision is good;says no eye exam in awhile:will schedule with [facility] soon    Goals Addressed  This Visit's Progress    Follow up with Primary Care Provider   On track    07/25/23- I will continue to take medications as prescribed and to keep appointments with PCP as scheduled.       Depression Screen     07/25/2023    1:43 PM 06/12/2023    3:13 PM 05/21/2022    1:53 PM 05/25/2021    3:18 PM 05/23/2020    4:11 PM 12/22/2018    3:38 PM 07/08/2017    9:29 AM  PHQ 2/9 Scores  PHQ - 2 Score 2 2 0 0 1 0 0  PHQ- 9 Score 6 5   3   0    Fall Risk     07/25/2023    1:33 PM 06/12/2023    3:14 PM 05/21/2022    2:05 PM 05/25/2021    3:17 PM 05/23/2020    3:09 PM  Fall Risk   Falls in the past year? 0 0 0 0 1  Number falls in past yr: 0 0 0  0  Injury with Fall? 0 0 0  0  Risk for fall due to : No Fall Risks Impaired mobility No Fall Risks    Follow up Education provided;Falls prevention discussed Falls evaluation completed Falls prevention discussed;Falls evaluation completed  Falls evaluation completed    MEDICARE RISK AT HOME:  Medicare Risk at Home Any stairs in or around the home?: No If so, are there any without handrails?: No Home free of loose throw rugs in walkways, pet beds, electrical cords, etc?: Yes Adequate lighting in your home to reduce risk of falls?: Yes Life alert?: Yes Use of a cane, walker or w/c?: No (uses scooter for steadiness) Grab bars in the bathroom?: Yes Shower chair or bench in shower?: Yes Elevated toilet seat or a handicapped toilet?: Yes  TIMED UP AND GO:  Was the test performed?  No  Cognitive Function: 6CIT completed    07/08/2017    9:48 AM 05/31/2016   12:14 PM  MMSE - Mini Mental State Exam  Orientation to time 5 5  Orientation to Place 5 5  Registration 3 3  Attention/ Calculation 0 0  Recall 2 3  Recall-comments unable to recall 1 of 3 words   Language- name 2 objects 0 0  Language- repeat 1 1  Language- follow 3 step command 3 3  Language- read & follow direction 0 0  Write a sentence 0 0  Copy design 0 0  Total  score 19 20        07/25/2023    1:51 PM 05/21/2022    2:10 PM  6CIT Screen  What Year? 0 points 0 points  What month? 0 points 0 points  What time? 0 points 0 points  Count back from 20 0 points 0 points  Months in reverse 0 points 0 points  Repeat phrase 2 points 2 points  Total Score 2 points 2 points    Immunizations Immunization History  Administered Date(s) Administered   Moderna Sars-Covid-2 Vaccination 10/25/2019, 11/21/2019   Td 07/13/2008    Screening Tests Health Maintenance  Topic Date Due   Pneumonia Vaccine 46+ Years old (1 of 2 - PCV) Never done   Zoster Vaccines- Shingrix (1 of 2) Never done   DTaP/Tdap/Td (2 - Tdap) 07/14/2018   COVID-19 Vaccine (3 - Moderna risk series) 12/19/2019   INFLUENZA VACCINE  10/31/2023   Medicare Annual Wellness (AWV)  07/24/2024   DEXA SCAN  Completed   HPV VACCINES  Aged Out   Meningococcal B Vaccine  Aged Out   Colonoscopy  Discontinued   Hepatitis C Screening  Discontinued    Health Maintenance  Health Maintenance Due  Topic Date Due   Pneumonia Vaccine 35+ Years old (1 of 2 - PCV) Never done   Zoster Vaccines- Shingrix (1 of 2) Never done   DTaP/Tdap/Td (2 - Tdap) 07/14/2018   COVID-19 Vaccine (3 - Moderna risk series) 12/19/2019   Health Maintenance Items Addressed: None needed at this time  Additional Screening:  Vision Screening: Recommended annual ophthalmology exams for early detection of glaucoma and other disorders of the eye.  Dental Screening: Recommended annual dental exams for proper oral hygiene  Community Resource Referral / Chronic Care Management: CRR required this visit?  No   CCM required this visit?  No Pt declines to make appt at this time. She indicates she will make appt when she can get through other numerous appts     Plan:     I have personally reviewed and noted the following in the patient's chart:   Medical and social history Use of alcohol, tobacco or illicit drugs   Current medications and supplements including opioid prescriptions. Patient is not currently taking opioid prescriptions. Functional ability and status Nutritional status Physical activity Advanced directives List of other physicians Hospitalizations, surgeries, and ER visits in previous 12 months Vitals Screenings to include cognitive, depression, and falls Referrals and appointments  In addition, I have reviewed and discussed with patient certain preventive protocols, quality metrics, and best practice recommendations. A written personalized care plan for preventive services as well as general preventive health recommendations were provided to patient.     Nerissa Bannister, LPN   1/61/0960   After Visit Summary: (MyChart) Due to this being a telephonic visit, the after visit summary with patients personalized plan was offered to patient via MyChart   Notes: Nothing significant to report at this time.

## 2023-07-28 ENCOUNTER — Ambulatory Visit

## 2023-07-28 ENCOUNTER — Encounter: Payer: Self-pay | Admitting: Medical

## 2023-07-28 ENCOUNTER — Ambulatory Visit: Attending: Medical | Admitting: Medical

## 2023-07-28 VITALS — BP 137/71 | HR 116 | Ht 63.0 in | Wt 122.0 lb

## 2023-07-28 DIAGNOSIS — Z79899 Other long term (current) drug therapy: Secondary | ICD-10-CM | POA: Diagnosis not present

## 2023-07-28 DIAGNOSIS — I480 Paroxysmal atrial fibrillation: Secondary | ICD-10-CM | POA: Diagnosis not present

## 2023-07-28 MED ORDER — EMPAGLIFLOZIN 10 MG PO TABS: 10.0000 mg | ORAL_TABLET | Freq: Every day | ORAL | 3 refills | Status: DC

## 2023-07-28 NOTE — Progress Notes (Unsigned)
 Cardiology Office Note:  .   Date:  07/28/2023  ID:  Karen Bowman, DOB [birth year], MRN [id number] PCP: Judithann Novas, MD  [facility] Providers Cardiologist:  Belva Boyden, MD Cardiology APP:  Charlette Console, FNP   History of Present Illness: .   Karen Bowman is a 83 y.o. female  with a h/o paroxysmal Afib, HLD, stroke March 2017 due to thrombosis of left middle cerebral artert, moderate to severe bilateral proximal PCA stenosis, mild right  cavernous and suprainclinoid ICA stenosis who presents for follow-up.    The patient was last seen 01/2023 and was in NSR on amiodarone  100mg  daily and metoprolol  12.5mg  daily. She is on Eliqus 2.5mg  BID for age and weight.    Saw PCP 3/6 for sinus infection and pleural effusions. She was in Afib and amiodarone  was increased by Dr. Adolphus Hoops instructions.  The patient was last seen 06/13/23 and was in SB, QTC . Amio was decreased to 100mg  daily. Recent BNP was 873 and it was recommend she take lasix  daily.   She came in for her echo 4/14 and was found to be back in rapid Afib. She saw Dr. Gollan, who set her up for cardioversion. She underwent successful cardioversion 4/15. Limited echo showed LVEF 25-30%, low normal RVSF, severely dilated atria, mild MR.  Today, the patient is back in Afib. She feels better, but can tell when she is in Afib. She feels heart heart racing at rare times. Breathing has been good. No chest pain. She has been taking lasix  3 times a week. She is taking amiodarone  200mg  daily.   Studies Reviewed: Aaron Aas   EKG Interpretation Date/Time:  Monday July 28 2023 15:23:41 EDT Ventricular Rate:  116 PR Interval:    QRS Duration:  92 QT Interval:  362 QTC Calculation: 503 R Axis:   31  Text Interpretation: Atrial fibrillation with rapid ventricular response ST & T wave abnormality, consider anterior ischemia When compared with ECG of 15-Jul-2023 07:47, PREVIOUS ECG IS PRESENT Confirmed by Gennaro Khat, Greysen Devino ([id number])  on 07/28/2023 3:53:11 PM    Limited echo 07/2023 1. Left ventricular ejection fraction, by estimation, is 25 to 30%. The  left ventricle has severely decreased function.   2. Right ventricular systolic function is low normal.   3. Left atrial size was severely dilated.   4. Right atrial size was severely dilated.   5. A small pericardial effusion is present.   6. The mitral valve is normal in structure. Mild mitral valve  regurgitation.   7. The aortic valve is tricuspid. Aortic valve regurgitation is not  visualized. Aortic valve sclerosis/calcification is present, without any  evidence of aortic stenosis.   8. The inferior vena cava is dilated in size with <50% respiratory  variability, suggesting right atrial pressure of 15 mmHg.   Echo 2017 Study Conclusions   - Left ventricle: The cavity size was normal. Wall thickness was    increased in a pattern of mild LVH. Systolic function was normal.    The estimated ejection fraction was in the range of 55% to 60%.    Wall motion was normal; there were no regional wall motion    abnormalities.  - Mitral valve: Moderately calcified annulus. Moderately thickened,    moderately calcified leaflets .  - Atrial septum: No defect or patent foramen ovale was identified.       Physical Exam:   VS:  BP 137/71 (BP Location: Left Arm, Patient Position: Sitting, Cuff Size:  Normal)   Pulse (!) 116   Ht 5\' 3"  (1.6 m)   Wt 122 lb (55.3 kg)   LMP  (LMP Unknown)   SpO2 97%   BMI 21.61 kg/m    Wt Readings from Last 3 Encounters:  07/28/23 122 lb (55.3 kg)  07/25/23 122 lb (55.3 kg)  07/15/23 122 lb (55.3 kg)    GEN: Well nourished, well developed in no acute distress NECK: No JVD; No carotid bruits CARDIAC: Irreg Irreg, no murmurs, rubs, gallops RESPIRATORY:  Clear to auscultation without rales, wheezing or rhonchi  ABDOMEN: Soft, non-tender, non-distended EXTREMITIES:  No edema; No deformity   ASSESSMENT AND PLAN: .    Persistent  Afib Patient is s/p successful DCCV 4/15. Today she is back in Afib with rates 110-120. Amio was previously decreased 200-> 100mg  due to Qtc . It was subsequently increased back to 200mg  daily right before the cardioversion. She overall feels better. I will check a 2 week heart monitor and refer to EP.   HFrEF Echo showed reduced LVEF 25-30% in the setting of rapid Afib. She has trace lower leg edema. Continue lasix  20mg  three times weekly. I will add on Jardiance 10mg  daily. BMET in 2 weeks. Continue Toprol  25mg  daily. Continue with GDMT as tolerated. Repeat echo once NSR is established and she is optimized on GDMT. Will likely need ischemic eval since there has been none in the past.   HTN BP is reasonable. Continue Toprol  25mg  daily.        Dispo: Follow-up in 1 months  Signed, Danalee Flath Rebekah Canada, PA-C

## 2023-07-28 NOTE — H&P (View-Only) (Signed)
 Cardiology Office Note:  .   Date:  07/28/2023  ID:  Karen Bowman, DOB [birth year], MRN [id number] PCP: Judithann Novas, MD  [facility] Providers Cardiologist:  Belva Boyden, MD Cardiology APP:  Charlette Console, FNP   History of Present Illness: .   Karen Bowman is a 83 y.o. female  with a h/o paroxysmal Afib, HLD, stroke March 2017 due to thrombosis of left middle cerebral artert, moderate to severe bilateral proximal PCA stenosis, mild right  cavernous and suprainclinoid ICA stenosis who presents for follow-up.    The patient was last seen 01/2023 and was in NSR on amiodarone  100mg  daily and metoprolol  12.5mg  daily. She is on Eliqus 2.5mg  BID for age and weight.    Saw PCP 3/6 for sinus infection and pleural effusions. She was in Afib and amiodarone  was increased by Dr. Adolphus Hoops instructions.  The patient was last seen 06/13/23 and was in SB, QTC . Amio was decreased to 100mg  daily. Recent BNP was 873 and it was recommend she take lasix  daily.   She came in for her echo 4/14 and was found to be back in rapid Afib. She saw Dr. Gollan, who set her up for cardioversion. She underwent successful cardioversion 4/15. Limited echo showed LVEF 25-30%, low normal RVSF, severely dilated atria, mild MR.  Today, the patient is back in Afib. She feels better, but can tell when she is in Afib. She feels heart heart racing at rare times. Breathing has been good. No chest pain. She has been taking lasix  3 times a week. She is taking amiodarone  200mg  daily.   Studies Reviewed: Aaron Aas   EKG Interpretation Date/Time:  Monday July 28 2023 15:23:41 EDT Ventricular Rate:  116 PR Interval:    QRS Duration:  92 QT Interval:  362 QTC Calculation: 503 R Axis:   31  Text Interpretation: Atrial fibrillation with rapid ventricular response ST & T wave abnormality, consider anterior ischemia When compared with ECG of 15-Jul-2023 07:47, PREVIOUS ECG IS PRESENT Confirmed by Gennaro Khat, Greysen Devino ([id number])  on 07/28/2023 3:53:11 PM    Limited echo 07/2023 1. Left ventricular ejection fraction, by estimation, is 25 to 30%. The  left ventricle has severely decreased function.   2. Right ventricular systolic function is low normal.   3. Left atrial size was severely dilated.   4. Right atrial size was severely dilated.   5. A small pericardial effusion is present.   6. The mitral valve is normal in structure. Mild mitral valve  regurgitation.   7. The aortic valve is tricuspid. Aortic valve regurgitation is not  visualized. Aortic valve sclerosis/calcification is present, without any  evidence of aortic stenosis.   8. The inferior vena cava is dilated in size with <50% respiratory  variability, suggesting right atrial pressure of 15 mmHg.   Echo 2017 Study Conclusions   - Left ventricle: The cavity size was normal. Wall thickness was    increased in a pattern of mild LVH. Systolic function was normal.    The estimated ejection fraction was in the range of 55% to 60%.    Wall motion was normal; there were no regional wall motion    abnormalities.  - Mitral valve: Moderately calcified annulus. Moderately thickened,    moderately calcified leaflets .  - Atrial septum: No defect or patent foramen ovale was identified.       Physical Exam:   VS:  BP 137/71 (BP Location: Left Arm, Patient Position: Sitting, Cuff Size:  Normal)   Pulse (!) 116   Ht 5\' 3"  (1.6 m)   Wt 122 lb (55.3 kg)   LMP  (LMP Unknown)   SpO2 97%   BMI 21.61 kg/m    Wt Readings from Last 3 Encounters:  07/28/23 122 lb (55.3 kg)  07/25/23 122 lb (55.3 kg)  07/15/23 122 lb (55.3 kg)    GEN: Well nourished, well developed in no acute distress NECK: No JVD; No carotid bruits CARDIAC: Irreg Irreg, no murmurs, rubs, gallops RESPIRATORY:  Clear to auscultation without rales, wheezing or rhonchi  ABDOMEN: Soft, non-tender, non-distended EXTREMITIES:  No edema; No deformity   ASSESSMENT AND PLAN: .    Persistent  Afib Patient is s/p successful DCCV 4/15. Today she is back in Afib with rates 110-120. Amio was previously decreased 200-> 100mg  due to Qtc . It was subsequently increased back to 200mg  daily right before the cardioversion. She overall feels better. I will check a 2 week heart monitor and refer to EP.   HFrEF Echo showed reduced LVEF 25-30% in the setting of rapid Afib. She has trace lower leg edema. Continue lasix  20mg  three times weekly. I will add on Jardiance 10mg  daily. BMET in 2 weeks. Continue Toprol  25mg  daily. Continue with GDMT as tolerated. Repeat echo once NSR is established and she is optimized on GDMT. Will likely need ischemic eval since there has been none in the past.   HTN BP is reasonable. Continue Toprol  25mg  daily.        Dispo: Follow-up in 1 months  Signed, Danalee Flath Rebekah Canada, PA-C

## 2023-07-28 NOTE — Patient Instructions (Signed)
 Medication Instructions:  Your physician recommends the following medication changes.  START TAKING: Jardiance 10 mg daily  *If you need a refill on your cardiac medications before your next appointment, please call your pharmacy*  Lab Work: BMP in 2 weeks  If you have labs (blood work) drawn today and your tests are completely normal, you will receive your results only by: MyChart Message (if you have MyChart) OR A paper copy in the mail If you have any lab test that is abnormal or we need to change your treatment, we will call you to review the results.  Testing/Procedures: Zio Heart monitor for 2 weeks  ZIO XT- Long Term Monitor Instructions  Your physician has requested you wear a ZIO patch monitor for 14 days.  This is a single patch monitor. [employer] supplies one patch monitor per enrollment. Additional stickers are not available. Please do not apply patch if you will be having a Nuclear Stress Test,  Echocardiogram, Cardiac CT, MRI, or Chest Xray during the period you would be wearing the  monitor. The patch cannot be worn during these tests. You cannot remove and re-apply the  ZIO XT patch monitor.  Your ZIO patch monitor will be mailed 3 day USPS to your address on file. It may take 3-5 days  to receive your monitor after you have been enrolled.  Once you have received your monitor, please review the enclosed instructions. Your monitor  has already been registered assigning a specific monitor serial # to you.  Billing and Patient Assistance Program Information  We have supplied [employer] with any of your insurance information on file for billing purposes. [employer] offers a sliding scale Patient Assistance Program for patients that do not have  insurance, or whose insurance does not completely cover the cost of the ZIO monitor.  You must apply for the Patient Assistance Program to qualify for this discounted rate.  To apply, please call [employer] at [phone], select option  4, select option 2, ask to apply for  Patient Assistance Program. Sanna Crystal will ask your household income, and how many people  are in your household. They will quote your out-of-pocket cost based on that information.  [employer] will also be able to set up a 28-month, interest-free payment plan if needed.  Applying the monitor   Shave hair from upper left chest.  Hold abrader disc by orange tab. Rub abrader in 40 strokes over the upper left chest as  indicated in your monitor instructions.  Clean area with 4 enclosed alcohol pads. Let dry.  Apply patch as indicated in monitor instructions. Patch will be placed under collarbone on left  side of chest with arrow pointing upward.  Rub patch adhesive wings for 2 minutes. Remove white label marked "1". Remove the white  label marked "2". Rub patch adhesive wings for 2 additional minutes.  While looking in a mirror, press and release button in center of patch. A small green light will  flash 3-4 times. This will be your only indicator that the monitor has been turned on.  Do not shower for the first 24 hours. You may shower after the first 24 hours.  Press the button if you feel a symptom. You will hear a small click. Record Date, Time and  Symptom in the Patient Logbook.  When you are ready to remove the patch, follow instructions on the last 2 pages of Patient  Logbook. Stick patch monitor onto the last page of Patient Logbook.  Place Patient Logbook in  the blue and white box. Use locking tab on box and tape box closed  securely. The blue and white box has prepaid postage on it. Please place it in the mailbox as  soon as possible. Your physician should have your test results approximately 7 days after the  monitor has been mailed back to [facility].  Call [facility] Customer Care at [phone] if you have questions regarding  your ZIO XT patch monitor. Call them immediately if you see an orange light blinking on your  monitor.  If  your monitor falls off in less than 4 days, contact our Monitor department at [phone].  If your monitor becomes loose or falls off after 4 days call [employer] at [phone] for  suggestions on securing your monitor   Follow-Up: At [facility], you and your health needs are our priority.  As part of our continuing mission to provide you with exceptional heart care, our providers are all part of one team.  This team includes your primary Cardiologist (physician) and Advanced Practice Providers or APPs (Physician Assistants and Nurse Practitioners) who all work together to provide you with the care you need, when you need it.  Your next appointment:   1 month for EP Keep 08/19/23 with Dr. Jerelene Monday  Provider:   Harvie Liner, MD or Ardeen Kohler, MD    We recommend signing up for the patient portal called "MyChart".  Sign up information is provided on this After Visit Summary.  MyChart is used to connect with patients for Virtual Visits (Telemedicine).  Patients are able to view lab/test results, encounter notes, upcoming appointments, etc.  Non-urgent messages can be sent to your provider as well.   To learn more about what you can do with MyChart, go to [email or web address].

## 2023-08-04 NOTE — Progress Notes (Signed)
 Electrophysiology Office Note:   Date:  08/05/2023  ID:  Karen Bowman, DOB 12-29-1940, MRN [id number]  Primary Cardiologist: Belva Boyden, MD Electrophysiologist: Ardeen Kohler, MD      History of Present Illness:   AVYANA Bowman is a 83 y.o. female with h/o paroxysmal Afib, HTN, HLD, stroke March 2017 due to thrombosis of left middle cerebral artert, moderate to severe bilateral proximal PCA stenosis, mild right cavernous and suprainclinoid ICA stenosis who is being seen today for evaluation of her atrial fibrillation.  Discussed the use of AI scribe software for clinical note transcription with the patient, who gave verbal consent to proceed.  History of Present Illness Saw PCP 3/6 for sinus infection and pleural effusions. She was in AF and amiodarone  was increased by Dr. Adolphus Hoops instructions. The patient was last seen 06/13/23 and was in SB with prolonged QTC of . Amio was decreased to 100mg  daily. She came in for her echocardiogram on 4/14 and was found to be back in rapid AF. She saw Dr. Gollan, who set her up for cardioversion. She underwent successful cardioversion 4/15. Limited echo showed LVEF 25-30%, low normal RVSF, severely dilated atria, mild MR. Presented for follow up visit on 4/28 and was back in AF.   She felt unwell for two days post-cardioversion, attributing it to anesthesia. Her heart rate remains high, around 130 beats per minute, despite taking metoprolol  and amiodarone . She takes one metoprolol  daily and one amiodarone  at night, along with amlodipine  and Eliquis , with no missed doses of Eliquis . Her heart rate remains elevated, and her heart's pumping function was noted to be reduced during an ultrasound while in atrial fibrillation. Earlier this year, she experienced fluid in her lungs and leg swelling, which has improved recently. Her legs are still slightly swollen by the end of the day but better than a week or two ago. She feels better during the day and  does not experience shortness of breath or significant palpitations while sitting still, although she is not very active. he notices her heart She does endorse her heart racing during nighttime bowel movements.  Review of systems complete and found to be negative unless listed in HPI.   EP Information / Studies Reviewed:    EKG is ordered today. Personal review as below.  EKG Interpretation Date/Time:  Tuesday Aug 05 2023 11:01:45 EDT Ventricular Rate:  130 PR Interval:    QRS Duration:  94 QT Interval:  370 QTC Calculation: 544 R Axis:   97  Text Interpretation: Atrial fibrillation with rapid ventricula rates Rightward axis Nonspecific ST and T wave abnormality When compared with ECG of 28-Jul-2023 15:23, Atrial flutter has replaced Atrial fibrillation Questionable change in QRS axis T wave inversion no longer evident in Anterior leads Confirmed by Ardeen Kohler [phone]) on 08/05/2023 11:05:03 AM   Echo 07/14/23:   1. Left ventricular ejection fraction, by estimation, is 25 to 30%. The  left ventricle has severely decreased function.   2. Right ventricular systolic function is low normal.   3. Left atrial size was severely dilated.   4. Right atrial size was severely dilated.   5. A small pericardial effusion is present.   6. The mitral valve is normal in structure. Mild mitral valve  regurgitation.   7. The aortic valve is tricuspid. Aortic valve regurgitation is not  visualized. Aortic valve sclerosis/calcification is present, without any  evidence of aortic stenosis.   8. The inferior vena cava is dilated in size with <50% respiratory  variability, suggesting right atrial pressure of 15 mmHg.   Risk Assessment/Calculations:    CHA2DS2-VASc Score = 8   This indicates a 10.8% annual risk of stroke. The patient's score is based upon: CHF History: 1 HTN History: 1 Diabetes History: 0 Stroke History: 2 Vascular Disease History: 1 Age Score: 2 Gender Score: 1              Physical Exam:   VS:  BP 120/62   Pulse (!) 130   Ht 5\' 3"  (1.6 m)   Wt 118 lb (53.5 kg)   LMP  (LMP Unknown)   SpO2 95%   BMI 20.90 kg/m    Wt Readings from Last 3 Encounters:  08/05/23 118 lb (53.5 kg)  07/28/23 122 lb (55.3 kg)  07/25/23 122 lb (55.3 kg)     GEN: Well nourished, well developed in no acute distress NECK: No JVD CARDIAC: Tachycardic, irregular RESPIRATORY:  Clear to auscultation without rales, wheezing or rhonchi  ABDOMEN: Soft, non-distended EXTREMITIES:  No edema; No deformity   ASSESSMENT AND PLAN:    #. Paroxysmal atrial fibrillation: Increasing burden. Associated with drop in LVEF. For these reasons, we have prioritized a rhythm control strategy. Poorly rate controlled currently. #. Secondary hypercoagulable state due to atrial fibrillation: CHADSVASC score of 8. - Increase metoprolol  XL to 50mg  BID given persistently elevated rates. - Continue Eliquis  2.5 mg twice daily. - Zio monitor pending. - Repeat cardioversion. She has had fluctuations in her amiodarone , so it is possible cardioversion now may be more effective. - We will have her return in a few weeks to follow up results of monitor, to see if she is maintaining sinus after 2nd cardioversion and to assess rates in sinus rhythm.  #.  High risk medication use: Antiarrhythmic drug therapy - amiodarone . LFTs normal 05/29/23. TSH normal 4.77. - Continue 200mg  daily.  #.  Chronic systolic heart failure: Newly reduced LVEF in the setting of atrial fibrillation, EF 25-30%. Well compensated.  - Continue follow-up with general cardiology. - Try to improve rates in short term with more metoprolol  and attempt cardioversion with amiodarone  for rhythm control.  - Will try to repeat echo in sinus, if able to maintain.   Follow up with Dr. Daneil Dunker  4 weeks.   Signed, Ardeen Kohler, MD

## 2023-08-05 ENCOUNTER — Ambulatory Visit: Attending: Cardiology | Admitting: Cardiology

## 2023-08-05 ENCOUNTER — Encounter: Payer: Self-pay | Admitting: Cardiology

## 2023-08-05 VITALS — BP 120/62 | HR 130 | Ht 63.0 in | Wt 118.0 lb

## 2023-08-05 DIAGNOSIS — I5022 Chronic systolic (congestive) heart failure: Secondary | ICD-10-CM | POA: Diagnosis not present

## 2023-08-05 DIAGNOSIS — D6869 Other thrombophilia: Secondary | ICD-10-CM | POA: Diagnosis not present

## 2023-08-05 DIAGNOSIS — Z79899 Other long term (current) drug therapy: Secondary | ICD-10-CM

## 2023-08-05 DIAGNOSIS — I480 Paroxysmal atrial fibrillation: Secondary | ICD-10-CM

## 2023-08-05 DIAGNOSIS — I4819 Other persistent atrial fibrillation: Secondary | ICD-10-CM | POA: Diagnosis not present

## 2023-08-05 MED ORDER — METOPROLOL SUCCINATE ER 50 MG PO TB24: 50.0000 mg | ORAL_TABLET | Freq: Two times a day (BID) | ORAL | 3 refills | Status: DC

## 2023-08-05 NOTE — Patient Instructions (Addendum)
 Medication Instructions:  Your physician has recommended you make the following change in your medication:  1) INCREASE Toprol  XL (metoprolol  succinate) to 50 mg twice daily  *If you need a refill on your cardiac medications before your next appointment, please call your pharmacy*  Lab Work: TODAY: BMET and CBC  Testing/Procedures: Cardioversion Your physician has recommended that you have a Cardioversion (DCCV). Electrical Cardioversion uses a jolt of electricity to your heart either through paddles or wired patches attached to your chest. This is a controlled, usually prescheduled, procedure. Defibrillation is done under light anesthesia in the hospital, and you usually go home the day of the procedure. This is done to get your heart back into a normal rhythm. You are not awake for the procedure. Please see the instruction sheet given to you today.  Follow-Up: At [facility], you and your health needs are our priority.  As part of our continuing mission to provide you with exceptional heart care, our providers are all part of one team.  This team includes your primary Cardiologist (physician) and Advanced Practice Providers or APPs (Physician Assistants and Nurse Practitioners) who all work together to provide you with the care you need, when you need it.  Your next appointment:   4 weeks  Provider:   Ardeen Kohler, MD

## 2023-08-07 ENCOUNTER — Ambulatory Visit: Payer: Self-pay | Admitting: Urology

## 2023-08-07 ENCOUNTER — Encounter: Payer: Self-pay | Admitting: Urology

## 2023-08-07 VITALS — BP 105/69 | HR 74 | Ht 63.0 in | Wt 118.0 lb

## 2023-08-07 DIAGNOSIS — N8111 Cystocele, midline: Secondary | ICD-10-CM

## 2023-08-07 DIAGNOSIS — N952 Postmenopausal atrophic vaginitis: Secondary | ICD-10-CM | POA: Diagnosis not present

## 2023-08-07 DIAGNOSIS — N3281 Overactive bladder: Secondary | ICD-10-CM

## 2023-08-07 MED ORDER — PREMARIN 0.625 MG/GM VA CREA: 1.0000 | TOPICAL_CREAM | Freq: Every day | VAGINAL | 12 refills | Status: AC

## 2023-08-07 NOTE — Progress Notes (Signed)
 04/23/2018 1:33 PM   Karen Bowman 1941/01/31 [id number]  Referring provider: Judithann Novas, MD [address] [place],  [place] [address]  Urological history: 1. OAB wet  2. Cystocele -s/p cystocele repair by GYN in early 1990's for symptomatic prolapse following benign cyst many years prior  Chief Complaint  Patient presents with   Follow-up    HPI: Karen Bowman is a 83 y.o. woman who presents today for a yearly follow up.   Previous records reviewed.     She is having 1-7 daytime voids, 3 more episodes of nocturia with a strong urge to urinate.  She has some urge incontinence.  She leaks 1-2 times daily.  She wears 2 absorbent pads mostly at night.  She has been instructed to have good fluid intake.  She does engage in toilet mapping.  She states her urinary symptoms are pretty erratic.  She can have great days where she has no leakage at all and then other days she will leak a great deal.  Overall, she is accepting of her urinary issues and does not want to start any treatment or medications for them.  Patient denies any modifying or aggravating factors.  Patient denies any recent UTI's, gross hematuria, dysuria or suprapubic/flank pain.  Patient denies any fevers, chills, nausea or vomiting.    She will continue her vaginal estrogen cream.   PMH: Past Medical History:  Diagnosis Date   Acute heart failure (HCC)    Anxiety    Arrhythmia    Arthritis    Atrial fibrillation (HCC)    Basal cell carcinoma 11/19/2021   left forehead above brow - ED&C   Heart murmur    Hyperlipemia    Hypertension    Stroke [facility])     Surgical History: Past Surgical History:  Procedure Laterality Date   ABDOMINAL HYSTERECTOMY     APPENDECTOMY     BLADDER SUSPENSION     CARDIOVERSION N/A 07/15/2023   Procedure: CARDIOVERSION;  Surgeon: Karen Fonder, MD;  Location: [facility] ORS;  Service: Cardiovascular;  Laterality: N/A;   ELECTROPHYSIOLOGIC STUDY N/A 10/18/2014    Procedure: CARDIOVERSION;  Surgeon: Karen Pilgrim, MD;  Location: [facility] ORS;  Service: Cardiovascular;  Laterality: N/A;   TEE WITHOUT CARDIOVERSION N/A 10/18/2014   Procedure: TRANSESOPHAGEAL ECHOCARDIOGRAM (TEE);  Surgeon: Karen Pilgrim, MD;  Location: [facility] ORS;  Service: Cardiovascular;  Laterality: N/A;   TONSILLECTOMY     TUMOR REMOVAL     right arm    Home Medications:  Allergies as of 08/07/2023       Reactions   Ciprofloxacin    REACTION: dizzy   Hydrochlorothiazide Nausea And Vomiting   Sulfonamide Derivatives    REACTION: nausea \\T \ vomiting   Valsartan    REACTION: angioedema - probable        Medication List        Accurate as of Aug 07, 2023  1:33 PM. If you have any questions, ask your nurse or doctor.          amiodarone  100 MG tablet Commonly known as: PACERONE  Take 1 tablet (100 mg total) by mouth daily.   amiodarone  200 MG tablet Commonly known as: PACERONE  Take 1 tablet (200 mg total) by mouth daily.   amLODipine  2.5 MG tablet Commonly known as: NORVASC  TAKE 1 TABLET BY MOUTH EVERY DAY   amoxicillin -clavulanate 875-125 MG tablet Commonly known as: AUGMENTIN  Take 1 tablet by mouth 2 (two) times daily.  atorvastatin  40 MG tablet Commonly known as: LIPITOR  TAKE 1 TABLET BY MOUTH DAILY AT 6 PM.   Cholecalciferol 25 MCG (1000 UT) capsule Take 2,000 Units by mouth daily.   CINNAMON PO Take 1 tablet by mouth daily.   CO-Q 10 Omega-3 Fish Oil Caps Take 1 capsule by mouth 2 (two) times daily.   CRANBERRY PO Take 1 tablet by mouth 2 (two) times daily.   doxazosin  1 MG tablet Commonly known as: CARDURA  Take 1 mg by mouth 2 (two) times daily.   Eliquis  2.5 MG Tabs tablet Generic drug: apixaban  TAKE 1 TABLET BY MOUTH TWICE A DAY   empagliflozin  10 MG Tabs tablet Commonly known as: Jardiance  Take 1 tablet (10 mg total) by mouth daily before breakfast.   ezetimibe  10 MG tablet Commonly known as: ZETIA  TAKE 1 TABLET BY MOUTH EVERY  DAY   furosemide  20 MG tablet Commonly known as: LASIX  Take 1 tablet (20 mg total) by mouth daily. Increase for 1 week to daily then return to three days a week   levothyroxine  50 MCG tablet Commonly known as: SYNTHROID  TAKE 1 TABLET BY MOUTH EVERY DAY   LYSINE PO Take by mouth daily.   MAGNESIUM PO Take by mouth daily.   metoprolol  succinate 50 MG 24 hr tablet Commonly known as: TOPROL -XL Take 1 tablet (50 mg total) by mouth in the morning and at bedtime. Take with or immediately following a meal.   MISC NATURAL PRODUCTS PO Take 1 tablet by mouth daily. Multivitamin-Dr. Sinatra   mupirocin  cream 2 % Commonly known as: Bactroban  Apply 1 application topically 2 (two) times daily.   NON FORMULARY Cell Rebuild Takes 1 tablet BID.   NON FORMULARY Canaflex 1 tablet daily.   NON FORMULARY Epivery Takes 1 tablet bid.   NON FORMULARY Limbex take one tablet daily   NON FORMULARY Sanguenol nitric oxide one tablet daily   NON FORMULARY Reprieve one tablet daily   OVER THE COUNTER MEDICATION Take 1,040 mg by mouth daily. Dandelion   potassium chloride  10 MEQ tablet Commonly known as: KLOR-CON  M Take 1 tablet (10 mEq total) by mouth daily.   Premarin  vaginal cream Generic drug: conjugated estrogens  Apply 0.5mg  (pea-sized amount)  just inside the vaginal introitus with a finger-tip on  Monday, Wednesday and Friday nights. What changed: Another medication with the same name was added. Make sure you understand how and when to take each.   Premarin  vaginal cream Generic drug: conjugated estrogens  Place 1 Applicatorful vaginally daily. Apply 0.5mg  (pea-sized amount)  just inside the vaginal introitus with a finger-tip on  Monday, Wednesday and Friday nights. What changed: You were already taking a medication with the same name, and this prescription was added. Make sure you understand how and when to take each.   TURMERIC PO Take by mouth 2 (two) times daily.   VITAMIN  E PO Take by mouth daily.        Allergies:  Allergies  Allergen Reactions   Ciprofloxacin     REACTION: dizzy   Hydrochlorothiazide Nausea And Vomiting   Sulfonamide Derivatives     REACTION: nausea \\T \ vomiting   Valsartan     REACTION: angioedema - probable    Family History: Family History  Problem Relation Age of Onset   Osteoporosis Mother    Heart disease Mother    Stroke Mother    Cancer Father    Diabetes Maternal Aunt     Social History:  reports that she has never  smoked. She has never used smokeless tobacco. She reports that she does not drink alcohol and does not use drugs.  ROS: For pertinent review of systems please refer to history of present illness  Physical Exam: BP 105/69   Pulse 74   Ht 5\' 3"  (1.6 m)   Wt 118 lb (53.5 kg)   LMP  (LMP Unknown)   BMI 20.90 kg/m   Constitutional:  Well nourished. Alert and oriented, No acute distress. HEENT: [place] AT, moist mucus membranes.  Trachea midline Cardiovascular: No clubbing, cyanosis, or edema. Respiratory: Normal respiratory effort, no increased work of breathing. Neurologic: Grossly intact, no focal deficits, moving all 4 extremities. Psychiatric: Normal mood and affect.     Laboratory Data: Results for orders placed or performed during the hospital encounter of 07/14/23  CBC   Collection Time: 07/14/23  4:09 PM  Result Value Ref Range   WBC 5.6 4.0 - 10.5 K/uL   RBC 4.28 3.87 - 5.11 MIL/uL   Hemoglobin 12.5 12.0 - 15.0 g/dL   HCT 11.9 14.7 - 82.9 %   MCV 88.6 80.0 - 100.0 fL   MCH 29.2 26.0 - 34.0 pg   MCHC 33.0 30.0 - 36.0 g/dL   RDW 56.2 (H) 13.0 - 86.5 %   Platelets 157 150 - 400 K/uL   nRBC 0.4 (H) 0.0 - 0.2 %  Basic metabolic panel with GFR   Collection Time: 07/14/23  4:09 PM  Result Value Ref Range   Sodium 139 135 - 145 mmol/L   Potassium 4.0 3.5 - 5.1 mmol/L   Chloride 108 98 - 111 mmol/L   CO2 23 22 - 32 mmol/L   Glucose, Bld 105 (H) 70 - 99 mg/dL   BUN 26 (H) 8 - 23 mg/dL    Creatinine, Ser 7.84 (H) 0.44 - 1.00 mg/dL   Calcium  8.6 (L) 8.9 - 10.3 mg/dL   GFR, Estimated 44 (L) >60 mL/min   Anion gap 8 5 - 15  I have reviewed the labs.  Pertinent imaging: No recent urological imaging  Assessment & Plan:    1. OAB wet -not bothersome  2. Cystocele -No bothersome incontinence symptoms at this time -managing with incontinence pads  3.  Vaginal atrophy -Continue vaginal estrogen cream 3 nights weekly -Refill sent to pharmacy  Return in about 1 year (around 08/06/2024) for OAB questionnaire.  These notes generated with voice recognition software. I apologize for typographical errors.  Briant Camper  [facility] [address] [place], [place] [address] [phone]

## 2023-08-09 ENCOUNTER — Other Ambulatory Visit: Payer: Self-pay | Admitting: Cardiovascular Disease

## 2023-08-11 ENCOUNTER — Telehealth: Payer: Self-pay | Admitting: *Deleted

## 2023-08-11 ENCOUNTER — Ambulatory Visit: Admitting: Dermatology

## 2023-08-11 ENCOUNTER — Encounter: Payer: Self-pay | Admitting: Dermatology

## 2023-08-11 DIAGNOSIS — L821 Other seborrheic keratosis: Secondary | ICD-10-CM

## 2023-08-11 DIAGNOSIS — Z79899 Other long term (current) drug therapy: Secondary | ICD-10-CM | POA: Diagnosis not present

## 2023-08-11 DIAGNOSIS — L817 Pigmented purpuric dermatosis: Secondary | ICD-10-CM

## 2023-08-11 DIAGNOSIS — L578 Other skin changes due to chronic exposure to nonionizing radiation: Secondary | ICD-10-CM

## 2023-08-11 DIAGNOSIS — I872 Venous insufficiency (chronic) (peripheral): Secondary | ICD-10-CM

## 2023-08-11 DIAGNOSIS — Z85828 Personal history of other malignant neoplasm of skin: Secondary | ICD-10-CM

## 2023-08-11 DIAGNOSIS — D229 Melanocytic nevi, unspecified: Secondary | ICD-10-CM | POA: Diagnosis not present

## 2023-08-11 DIAGNOSIS — L814 Other melanin hyperpigmentation: Secondary | ICD-10-CM | POA: Diagnosis not present

## 2023-08-11 DIAGNOSIS — Z1283 Encounter for screening for malignant neoplasm of skin: Secondary | ICD-10-CM | POA: Diagnosis not present

## 2023-08-11 NOTE — Patient Instructions (Addendum)
 Melanoma ABCDEs  Melanoma is the most dangerous type of skin cancer, and is the leading cause of death from skin disease.  You are more likely to develop melanoma if you: Have light-colored skin, light-colored eyes, or red or blond hair Spend a lot of time in the sun Tan regularly, either outdoors or in a tanning bed Have had blistering sunburns, especially during childhood Have a close family member who has had a melanoma Have atypical moles or large birthmarks  Early detection of melanoma is key since treatment is typically straightforward and cure rates are extremely high if we catch it early.   The first sign of melanoma is often a change in a mole or a new dark spot.  The ABCDE system is a way of remembering the signs of melanoma.  A for asymmetry:  The two halves do not match. B for border:  The edges of the growth are irregular. C for color:  A mixture of colors are present instead of an even brown color. D for diameter:  Melanomas are usually (but not always) greater than 6mm - the size of a pencil eraser. E for evolution:  The spot keeps changing in size, shape, and color.  Please check your skin once per month between visits. You can use a small mirror in front and a large mirror behind you to keep an eye on the back side or your body.   If you see any new or changing lesions before your next follow-up, please call to schedule a visit.  Please continue daily skin protection including broad spectrum sunscreen SPF 30+ to sun-exposed areas, reapplying every 2 hours as needed when you're outdoors.   Staying in the shade or wearing long sleeves, sun glasses (UVA+UVB protection) and wide brim hats (4-inch brim around the entire circumference of the hat) are also recommended for sun protection.     Due to recent changes in healthcare laws, you may see results of your pathology and/or laboratory studies on MyChart before the doctors have had a chance to review them. We understand that  in some cases there may be results that are confusing or concerning to you. Please understand that not all results are received at the same time and often the doctors may need to interpret multiple results in order to provide you with the best plan of care or course of treatment. Therefore, we ask that you please give [place] 2 business days to thoroughly review all your results before contacting the office for clarification. Should we see a critical lab result, you will be contacted sooner.   If You Need Anything After Your Visit  If you have any questions or concerns for your doctor, please call our main line at [phone] and press option 4 to reach your doctor's medical assistant. If no one answers, please leave a voicemail as directed and we will return your call as soon as possible. Messages left after 4 pm will be answered the following business day.   You may also send [place] a message via MyChart. We typically respond to MyChart messages within 1-2 business days.  For prescription refills, please ask your pharmacy to contact our office. Our fax number is [phone].  If you have an urgent issue when the clinic is closed that cannot wait until the next business day, you can page your doctor at the number below.    Please note that while we do our best to be available for urgent issues outside of office hours,  we are not available 24/7.   If you have an urgent issue and are unable to reach [place], you may choose to seek medical care at your doctor's office, retail clinic, urgent care center, or emergency room.  If you have a medical emergency, please immediately call 911 or go to the emergency department.  Pager Numbers  - Dr. Gwen Pounds: [phone]  - Dr. Roseanne Reno: [phone]  - Dr. Katrinka Blazing: [phone]   In the event of inclement weather, please call our main line at [phone] for an update on the status of any delays or closures.  Dermatology Medication Tips: Please keep the boxes  that topical medications come in in order to help keep track of the instructions about where and how to use these. Pharmacies typically print the medication instructions only on the boxes and not directly on the medication tubes.   If your medication is too expensive, please contact our office at [phone] option 4 or send [place] a message through MyChart.   We are unable to tell what your co-pay for medications will be in advance as this is different depending on your insurance coverage. However, we may be able to find a substitute medication at lower cost or fill out paperwork to get insurance to cover a needed medication.   If a prior authorization is required to get your medication covered by your insurance company, please allow [place] 1-2 business days to complete this process.  Drug prices often vary depending on where the prescription is filled and some pharmacies may offer cheaper prices.  The website www.goodrx.com contains coupons for medications through different pharmacies. The prices here do not account for what the cost may be with help from insurance (it may be cheaper with your insurance), but the website can give you the price if you did not use any insurance.  - You can print the associated coupon and take it with your prescription to the pharmacy.  - You may also stop by our office during regular business hours and pick up a GoodRx coupon card.  - If you need your prescription sent electronically to a different pharmacy, notify our office through [facility] or by phone at [phone] option 4.     Si Usted Necesita Algo Despus de Su Visita  Tambin puede enviarnos un mensaje a travs de Clinical cytogeneticist. Por lo general respondemos a los mensajes de MyChart en el transcurso de 1 a 2 das hbiles.  Para renovar recetas, por favor pida a su farmacia que se ponga en contacto con nuestra oficina. Annie Sable de fax es [place] [phone].  Si tiene un asunto urgente cuando la  clnica est cerrada y que no puede esperar hasta el siguiente da hbil, puede llamar/localizar a su doctor(a) al nmero que aparece a continuacin.   Por favor, tenga en cuenta que aunque hacemos todo lo posible para estar disponibles para asuntos urgentes fuera del horario de [place], no estamos disponibles las 24 horas del da, los 7 [address]  [address] de la [place].   Si tiene un problema urgente y no puede comunicarse con nosotros, puede optar por buscar atencin mdica  en el consultorio de su doctor(a), en una clnica privada, en un centro de atencin urgente o en una sala de emergencias.  Si tiene Engineer, drilling, por favor llame inmediatamente al 911 o vaya a la sala de emergencias.  Nmeros de bper  - Dr. Gwen Pounds: [phone]  - Dra. Roseanne Reno: [phone]  - Dr. Katrinka Blazing: [phone]   En caso de inclemencias  del [place], por favor llame a Ferne Co[place] lnea principal al [phone] para una actualizacin sobre el estado de cualquier retraso o cierre.  Consejos para la medicacin en dermatologa: Por favor, guarde las cajas en las que vienen los medicamentos de uso tpico para ayudarle a seguir las instrucciones sobre dnde y cmo usarlos. Las farmacias generalmente imprimen las instrucciones del medicamento slo en las cajas y no directamente en los tubos del [place].   Si su medicamento es muy caro, por favor, pngase en contacto con Rolm Gala llamando al [phone] y presione la opcin 4 o envenos un mensaje a travs de Clinical cytogeneticist.   No podemos decirle cul ser su copago por los medicamentos por adelantado ya que esto es diferente dependiendo de la cobertura de su seguro. Sin embargo, es posible que podamos encontrar un medicamento sustituto a Audiological scientist un formulario para que el seguro cubra el medicamento que se considera necesario.   Si se requiere una autorizacin previa para que su compaa de seguros [place] su medicamento, por favor permtanos de 1 a 2 das hbiles  para completar [address]  Los precios de los medicamentos varan con frecuencia dependiendo del Environmental consultant de dnde se surte la receta y alguna farmacias pueden ofrecer precios ms baratos.  El sitio web www.goodrx.com tiene cupones para medicamentos de Health and safety inspector. Los precios aqu no tienen en cuenta lo que podra costar con la ayuda del seguro (puede ser ms barato con su seguro), pero el sitio web puede darle el precio si no utiliz Tourist information centre manager.  - Puede imprimir el cupn correspondiente y llevarlo con su receta a la farmacia.  - Tambin puede pasar por nuestra oficina durante el horario de atencin regular y Education officer, museum una tarjeta de cupones de GoodRx.  - Si necesita que su receta se enve electrnicamente a una farmacia diferente, informe a nuestra oficina a travs de MyChart de [place] o por telfono llamando al [phone] y presione la opcin 4.

## 2023-08-11 NOTE — Progress Notes (Signed)
   Follow-Up Visit   Subjective  Karen Bowman is a 83 y.o. female who presents for the following:  hx of bcc  Left forehead above medial brow scar  Hx of isks, hx of aks,   The patient has spots, moles and lesions to be evaluated, some may be new or changing and the patient may have concern these could be cancer.  The following portions of the chart were reviewed this encounter and updated as appropriate: medications, allergies, medical history  Review of Systems:  No other skin or systemic complaints except as noted in HPI or Assessment and Plan.  Objective  Well appearing patient in no apparent distress; mood and affect are within normal limits.  A focused examination was performed of the following areas: Back, face, arms, legs  Relevant exam findings are noted in the Assessment and Plan.   Assessment & Plan   SEBORRHEIC KERATOSIS - Stuck-on, waxy, tan-brown papules and/or plaques  - Benign-appearing - Discussed benign etiology and prognosis. - Observe - Call for any changes  HEMANGIOMA Exam: red papule(s) Discussed benign nature. Recommend observation. Call for changes.  MELANOCYTIC NEVI Exam: Tan-brown and/or pink-flesh-colored symmetric macules and papules Treatment Plan: Benign appearing on exam today. Recommend observation. Call clinic for new or changing moles. Recommend daily use of broad spectrum spf 30+ sunscreen to sun-exposed areas.   STASIS DERMATITIS with [place] Purpura  Exam: Erythematous, scaly patches involving the ankle and distal lower leg with associated lower leg edema. Chronic and persistent condition with duration or expected duration over one year. Condition is symptomatic/ bothersome to patient. Not currently at goal. Stasis in the legs causes chronic leg swelling, which may result in itchy or painful rashes, skin discoloration, skin texture changes, and sometimes ulceration.  Recommend daily graduated compression hose/stockings- easiest  to put on first thing in morning, remove at bedtime.  Elevate legs as much as possible. Avoid salt/sodium rich foods. Treatment Plan: No recommended treatment   ACTINIC DAMAGE - chronic, secondary to cumulative UV radiation exposure/sun exposure over time - diffuse scaly erythematous macules with underlying dyspigmentation - Recommend daily broad spectrum sunscreen SPF 30+ to sun-exposed areas, reapply every 2 hours as needed.  - Recommend staying in the shade or wearing long sleeves, sun glasses (UVA+UVB protection) and wide brim hats (4-inch brim around the entire circumference of the hat). - Call for new or changing lesions.  HISTORY OF BASAL CELL CARCINOMA OF THE SKIN Left forehead above brow -  - No evidence of recurrence today - Recommend regular full body skin exams - Recommend daily broad spectrum sunscreen SPF 30+ to sun-exposed areas, reapply every 2 hours as needed.  - Call if any new or changing lesions are noted between office visits   Return in about 1 year (around 08/10/2024) for TBSE.  IRandee Busing, CMA, am acting as scribe for Celine Collard, MD.   Documentation: I have reviewed the above documentation for accuracy and completeness, and I agree with the above.  Celine Collard, MD

## 2023-08-11 NOTE — Telephone Encounter (Signed)
 Called and spoke with patient and reviewed instructions and confirmed date and time. She verbalized understanding and all questions were answered.   Scheduled for DCCV 08/13/23 at 07:30 am with arrival time of 006:30 am.

## 2023-08-12 ENCOUNTER — Ambulatory Visit: Payer: Self-pay

## 2023-08-12 LAB — BASIC METABOLIC PANEL WITH GFR
BUN/Creatinine Ratio: 17 (ref 12–28)
BUN: 20 mg/dL (ref 8–27)
CO2: 20 mmol/L (ref 20–29)
Calcium: 9.4 mg/dL (ref 8.7–10.3)
Chloride: 104 mmol/L (ref 96–106)
Creatinine, Ser: 1.16 mg/dL — ABNORMAL HIGH (ref 0.57–1.00)
Glucose: 90 mg/dL (ref 70–99)
Potassium: 4.2 mmol/L (ref 3.5–5.2)
Sodium: 142 mmol/L (ref 134–144)
eGFR: 48 mL/min/{1.73_m2} — ABNORMAL LOW (ref >59)

## 2023-08-13 ENCOUNTER — Ambulatory Visit: Admitting: General Practice

## 2023-08-13 ENCOUNTER — Encounter
Admission: RE | Disposition: A | Discharge status: Home / Self Care | Payer: Self-pay | Source: Home / Self Care | Attending: Cardiovascular Disease

## 2023-08-13 ENCOUNTER — Encounter: Payer: Self-pay | Admitting: Cardiovascular Disease

## 2023-08-13 ENCOUNTER — Other Ambulatory Visit: Payer: Self-pay

## 2023-08-13 ENCOUNTER — Ambulatory Visit
Admission: RE | Admit: 2023-08-13 | Discharge: 2023-08-13 | Disposition: A | Discharge status: Home / Self Care | Attending: Cardiovascular Disease | Admitting: Cardiovascular Disease

## 2023-08-13 DIAGNOSIS — I110 Hypertensive heart disease with heart failure: Secondary | ICD-10-CM | POA: Diagnosis not present

## 2023-08-13 DIAGNOSIS — Z79899 Other long term (current) drug therapy: Secondary | ICD-10-CM | POA: Diagnosis not present

## 2023-08-13 DIAGNOSIS — Z8673 Personal history of transient ischemic attack (TIA), and cerebral infarction without residual deficits: Secondary | ICD-10-CM | POA: Diagnosis not present

## 2023-08-13 DIAGNOSIS — Z7901 Long term (current) use of anticoagulants: Secondary | ICD-10-CM | POA: Insufficient documentation

## 2023-08-13 DIAGNOSIS — I739 Peripheral vascular disease, unspecified: Secondary | ICD-10-CM | POA: Diagnosis not present

## 2023-08-13 DIAGNOSIS — I5022 Chronic systolic (congestive) heart failure: Secondary | ICD-10-CM | POA: Diagnosis not present

## 2023-08-13 DIAGNOSIS — I4819 Other persistent atrial fibrillation: Secondary | ICD-10-CM | POA: Diagnosis not present

## 2023-08-13 DIAGNOSIS — I480 Paroxysmal atrial fibrillation: Secondary | ICD-10-CM | POA: Diagnosis not present

## 2023-08-13 DIAGNOSIS — Z01818 Encounter for other preprocedural examination: Secondary | ICD-10-CM

## 2023-08-13 DIAGNOSIS — R0602 Shortness of breath: Secondary | ICD-10-CM | POA: Diagnosis present

## 2023-08-13 SURGERY — CARDIOVERSION
Anesthesia: General

## 2023-08-13 MED ORDER — SODIUM CHLORIDE 0.9 % IV SOLN: INTRAVENOUS | Status: DC

## 2023-08-13 MED ORDER — PROPOFOL 10 MG/ML IV BOLUS
INTRAVENOUS | Status: DC | PRN
Start: 2023-08-13 — End: 2023-08-13
  Administered 2023-08-13: 60 mg via INTRAVENOUS

## 2023-08-13 MED ORDER — PROPOFOL 10 MG/ML IV BOLUS
INTRAVENOUS | Status: AC
  Filled 2023-08-13: qty 20

## 2023-08-13 NOTE — Anesthesia Preprocedure Evaluation (Signed)
 Anesthesia Evaluation  Patient identified by MRN, date of birth, ID band Patient awake    Reviewed: Allergy & Precautions, H&P , NPO status , Patient's Chart, lab work & pertinent test results  Airway Mallampati: II  TM Distance: >3 FB Neck ROM: full    Dental no notable dental hx.    Pulmonary shortness of breath   Pulmonary exam normal        Cardiovascular hypertension, + Peripheral Vascular Disease and +CHF  + dysrhythmias Atrial Fibrillation (-) pacemaker+ Valvular Problems/Murmurs  Rhythm:Irregular Rate:Tachycardia - Systolic murmurs, - Diastolic murmurs and - Peripheral Edema    Neuro/Psych  PSYCHIATRIC DISORDERS Anxiety     CVA (2017), No Residual Symptoms    GI/Hepatic negative GI ROS, Neg liver ROS,,,  Endo/Other  Hypothyroidism    Renal/GU      Musculoskeletal   Abdominal   Peds  Hematology negative hematology ROS (+)   Anesthesia Other Findings Past Medical History: No date: Acute heart failure (HCC) No date: Anxiety No date: Arrhythmia No date: Arthritis No date: Atrial fibrillation (HCC) 11/19/2021: Basal cell carcinoma     Comment:  left forehead above brow - ED&C No date: Heart murmur No date: Hyperlipemia No date: Hypertension No date: Stroke [facility])  Past Surgical History: No date: ABDOMINAL HYSTERECTOMY No date: APPENDECTOMY No date: BLADDER SUSPENSION 10/18/2014: ELECTROPHYSIOLOGIC STUDY; N/A     Comment:  Procedure: CARDIOVERSION;  Surgeon: Lake Pilgrim, MD;              Location: [facility] ORS;  Service: Cardiovascular;                Laterality: N/A; 10/18/2014: TEE WITHOUT CARDIOVERSION; N/A     Comment:  Procedure: TRANSESOPHAGEAL ECHOCARDIOGRAM (TEE);                Surgeon: Lake Pilgrim, MD;  Location: [facility] ORS;                Service: Cardiovascular;  Laterality: N/A; No date: TONSILLECTOMY No date: TUMOR REMOVAL     Comment:  right arm  BMI    Body Mass Index: 21.61 kg/m       Reproductive/Obstetrics negative OB ROS                             Anesthesia Physical Anesthesia Plan  ASA: 3  Anesthesia Plan: General   Post-op Pain Management: Minimal or no pain anticipated   Induction: Intravenous  PONV Risk Score and Plan: 3 and Propofol  infusion, TIVA and Ondansetron   Airway Management Planned: Nasal Cannula  Additional Equipment: None  Intra-op Plan:   Post-operative Plan:   Informed Consent: I have reviewed the patients History and Physical, chart, labs and discussed the procedure including the risks, benefits and alternatives for the proposed anesthesia with the patient or authorized representative who has indicated his/her understanding and acceptance.     Dental advisory given  Plan Discussed with: CRNA and Surgeon  Anesthesia Plan Comments: (Discussed risks of anesthesia with patient, including possibility of difficulty with spontaneous ventilation under anesthesia necessitating airway intervention, PONV, and rare risks such as cardiac or respiratory or neurological events, and allergic reactions. Discussed the role of CRNA in patient's perioperative care. Patient understands.)       Anesthesia Quick Evaluation

## 2023-08-13 NOTE — CV Procedure (Signed)
 Cardioversion procedure note For atrial fibrillation, persistent.  Procedure Details:  Consent: Risks of procedure as well as the alternatives and risks of each were explained to the (patient/caregiver).  Consent for procedure obtained.  Time Out: Verified patient identification, verified procedure, site/side was marked, verified correct patient position, special equipment/implants available, medications/allergies/relevent history reviewed, required imaging and test results available.  Performed  Patient placed on cardiac monitor, pulse oximetry, supplemental oxygen as necessary.   Sedation given: propofol  IV, Dr. Duayne Gey Pacer pads placed anterior and posterior chest.   Cardioverted 1 time(s).   Cardioverted at  150 J. Synchronized biphasic Converted to long conversion pauses followed by sinus bradycardia   Evaluation: Findings: Post procedure EKG shows: NSR Complications: None Patient did tolerate procedure well.  Time Spent Directly with the Patient:  45 minutes   Tim Baylee Campus, M.D., Ph.D.

## 2023-08-13 NOTE — Transfer of Care (Signed)
 Immediate Anesthesia Transfer of Care Note  Patient: Karen Bowman  Procedure(s) Performed: CARDIOVERSION  Patient Location: Specials  Anesthesia Type:General  Level of Consciousness: drowsy  Airway & Oxygen Therapy: Patient Spontanous Breathing and Patient connected to nasal cannula oxygen  Post-op Assessment: Report given to RN and Post -op Vital signs reviewed and stable  Post vital signs: Reviewed and stable  Last Vitals:  Vitals Value Taken Time  BP 90/57 08/13/23 0751  Temp    Pulse 41 08/13/23 0754  Resp 18 08/13/23 0754  SpO2 96 % 08/13/23 0754    Last Pain:  Vitals:   08/13/23 0656  TempSrc: Oral  PainSc: 0-No pain         Complications: No notable events documented.

## 2023-08-13 NOTE — Anesthesia Postprocedure Evaluation (Signed)
 Anesthesia Post Note  Patient: Karen Bowman  Procedure(s) Performed: CARDIOVERSION  Patient location during evaluation: Specials Recovery Anesthesia Type: General Level of consciousness: awake and alert Pain management: pain level controlled Vital Signs Assessment: post-procedure vital signs reviewed and stable Respiratory status: spontaneous breathing, nonlabored ventilation, respiratory function stable and patient connected to nasal cannula oxygen Cardiovascular status: blood pressure returned to baseline and stable Postop Assessment: no apparent nausea or vomiting Anesthetic complications: no  No notable events documented.   Last Vitals:  Vitals:   08/13/23 0830 08/13/23 0845  BP: 122/71 117/60  Pulse: (!) 55 (!) 51  Resp: 17 14  Temp:    SpO2: 95% 95%    Last Pain:  Vitals:   08/13/23 0845  TempSrc:   PainSc: 0-No pain                 Enrique Harvest

## 2023-08-14 ENCOUNTER — Encounter: Payer: Self-pay | Admitting: Cardiovascular Disease

## 2023-08-15 NOTE — Interval H&P Note (Signed)
 History and Physical Interval Note:  08/15/2023 8:02 AM  Karen Bowman  has presented today for surgery, with the diagnosis of Cardioversion   Afib.  The various methods of treatment have been discussed with the patient and family. After consideration of risks, benefits and other options for treatment, the patient has consented to  Procedure(s): CARDIOVERSION (N/A) as a surgical intervention.  The patient's history has been reviewed, patient examined, no change in status, stable for surgery.  I have reviewed the patient's chart and labs.  Questions were answered to the patient's satisfaction.     Mishael Krysiak

## 2023-08-15 NOTE — H&P (Signed)

## 2023-08-18 NOTE — Progress Notes (Signed)
 Cardiology Office Note  Date:  08/19/2023   ID:  Karen Bowman, DOB [birth year], MRN [id number]  PCP:  Judithann Novas, MD   Chief Complaint  Patient presents with   6 month follow up     Patient c/o shortness of breath with walking a long distance.     HPI:  83 year old woman with history of  paroxysmal atrial fibrillation,  hospitalization July 5 with discharge 10/20/2014 for atrial fibrillation,  discharged on amiodarone , anticoagulation, digoxin , metoprolol .  hyperlipidemia stroke March 2017, still right leg weakness Moderate to severe bilateral proximal PCA stenoses. Mild right cavernous and supraclinoid ICA stenosis. She presents today for follow-up of her atrial fibrillation /atrial flutter  Last seen by myself in clinic May 24 June 13, 2023 sinus bradycardia, amiodarone  down to 100 mg Echocardiogram July 14, 2023 noted to be back in atrial fibrillation with RVR, set up for cardioversion Echo July 14, 2023 ejection fraction 25 to 30% Cardioversion July 15, 2023 Follow-up visit July 28, 2023 back in atrial fibrillation Seen by Dr. Daneil Dunker EP Aug 05, 2023 remained in atrial fibrillation, on amiodarone , rate 130 bpm Metoprolol  succinate increased up to 50 twice daily, amiodarone  200 daily Cardioversion for atrial fibrillation performed Aug 13, 2023,  Post cardioversion bradycardia noted Scheduled to see Dr. Daneil Dunker June 3  Today, back in atrial flutter rate 139 bpm Mild symptoms, Complaining more of her sciatica left buttock area down her left leg  Sleeping well,  Remains on amiodarone  100 mg Twice a day Metoprolol  succinate 50 Mg BID Eliquis  twice daily Denies chest pain, minimal shortness of breath on exertion Does not know when she converted to flutter  EKG personally reviewed by myself on todays visit EKG Interpretation Date/Time:  Tuesday Aug 19 2023 13:55:26 EDT Ventricular Rate:  139 PR Interval:    QRS Duration:  90 QT Interval:  242 QTC  Calculation: 368 R Axis:   26  Text Interpretation: Atrial flutter with rapid ventricular response Nonspecific ST and T wave abnormality When compared with ECG of 13-Aug-2023 07:42, rhythm has changed Confirmed by Belva Boyden [phone]) on 08/19/2023 1:59:10 PM    PMD 05/28/22: atrial fib/fluuter, rate 130s TSH 22 Given amiodarone  load 400 twice daily for 1 week down to 200 twice daily   Lab Results  Component Value Date   CHOL 115 05/29/2023   HDL 49.00 05/29/2023   LDLCALC 53 05/29/2023   TRIG 65.0 05/29/2023    Other past medical hx Echo 05/2015: EF 55 to 60%  Neck CT  05/2015 IMPRESSION: 1. Moderate to severe bilateral proximal PCA stenoses. 2. Mild right cavernous and supraclinoid ICA stenosis.  Hospital March 2017, right side deficits upper extremity, lower extremity, speech deficits TPA was not given CT scan of the head and neck documenting diffuse PAD Discharged with eliquis  and aspirin  81 mg daily She has completed rehabilitation, has been home for approximately one month, Reports improving deficits, able to floss her teeth, brush her hair,  graduated to a cane from a walker Denies any tachycardia or palpitations concerning for arrhythmia   Notes indicate TEE with cardioversion 10/18/2014 which was unsuccessful    PMH:   has a past medical history of Acute heart failure (HCC), Anxiety, Arrhythmia, Arthritis, Atrial fibrillation (HCC), Basal cell carcinoma (11/19/2021), Heart murmur, Hyperlipemia, Hypertension, and Stroke (HCC).  PSH:    Past Surgical History:  Procedure Laterality Date   ABDOMINAL HYSTERECTOMY     APPENDECTOMY     BLADDER SUSPENSION  CARDIOVERSION N/A 07/15/2023   Procedure: CARDIOVERSION;  Surgeon: Devorah Fonder, MD;  Location: [facility];  Service: Cardiovascular;  Laterality: N/A;   CARDIOVERSION N/A 08/13/2023   Procedure: CARDIOVERSION;  Surgeon: Devorah Fonder, MD;  Location: [facility];  Service: Cardiovascular;  Laterality: N/A;    ELECTROPHYSIOLOGIC STUDY N/A 10/18/2014   Procedure: CARDIOVERSION;  Surgeon: Lake Pilgrim, MD;  Location: [facility];  Service: Cardiovascular;  Laterality: N/A;   TEE WITHOUT CARDIOVERSION N/A 10/18/2014   Procedure: TRANSESOPHAGEAL ECHOCARDIOGRAM (TEE);  Surgeon: Lake Pilgrim, MD;  Location: [facility];  Service: Cardiovascular;  Laterality: N/A;   TONSILLECTOMY     TUMOR REMOVAL     right arm    Current Outpatient Medications  Medication Sig Dispense Refill   amiodarone  (PACERONE ) 200 MG tablet Take 1 tablet (200 mg total) by mouth daily.     amLODipine  (NORVASC ) 2.5 MG tablet TAKE 1 TABLET BY MOUTH EVERY DAY 90 tablet 0   apixaban  (ELIQUIS ) 2.5 MG TABS tablet TAKE 1 TABLET BY MOUTH TWICE A DAY 180 tablet 1   APPLE CIDER VINEGAR PO Take 250 mg by mouth daily.     atorvastatin  (LIPITOR ) 40 MG tablet TAKE 1 TABLET BY MOUTH DAILY AT 6 PM. 90 tablet 1   BORON PO Take 1 tablet by mouth daily.     Cholecalciferol 250 MCG (10000 UT) CAPS Take 10,000 Units by mouth 2 (two) times daily.     chromium picolinate tablet Take 600 mcg by mouth daily.     CINNAMON PO Take 300 mg by mouth daily.     conjugated estrogens  (PREMARIN ) vaginal cream Place 1 Applicatorful vaginally daily. Apply 0.5mg  (pea-sized amount)  just inside the vaginal introitus with a finger-tip on  Monday, Wednesday and Friday nights. 30 g 12   CRANBERRY PO Take 4,200 mg by mouth 2 (two) times daily. With vitamin C     doxazosin  (CARDURA ) 1 MG tablet TAKE 1 TABLET BY MOUTH TWICE A DAY 180 tablet 3   empagliflozin  (JARDIANCE ) 10 MG TABS tablet Take 1 tablet (10 mg total) by mouth daily before breakfast. 90 tablet 3   ezetimibe  (ZETIA ) 10 MG tablet TAKE 1 TABLET BY MOUTH EVERY DAY 90 tablet 0   furosemide  (LASIX ) 20 MG tablet Take 1 tablet (20 mg total) by mouth daily. Increase for 1 week to daily then return to three days a week 30 tablet 11   levothyroxine  (SYNTHROID ) 50 MCG tablet TAKE 1 TABLET BY MOUTH EVERY DAY 30 tablet 11    MAGNESIUM PO Take 400 mg by mouth daily.     metoprolol  succinate (TOPROL -XL) 50 MG 24 hr tablet Take 1 tablet (50 mg total) by mouth in the morning and at bedtime. Take with or immediately following a meal. 180 tablet 3   MISC NATURAL PRODUCTS PO Take 1 tablet by mouth daily. Multivitamin-Dr. Kem Patten     NON FORMULARY Take 1 tablet by mouth 2 (two) times daily. Cell Rebuild     NON FORMULARY Take 1 tablet by mouth daily. Liposomal     NON FORMULARY Take 210 mg by mouth 2 (two) times daily.  Omega Q plus Max     NON FORMULARY Take 1 tablet by mouth daily. [place]     NON FORMULARY Take 1 tablet by mouth daily. [employer] digestive complete     NON FORMULARY Take 1 tablet by mouth daily. Lidtke Glycotrol     OVER THE COUNTER MEDICATION Take 2,080 mg by mouth daily.  Dandelion     OVER THE COUNTER MEDICATION Take 1 tablet by mouth 2 (two) times daily. Blood pressure support     OVER THE COUNTER MEDICATION Take 1 tablet by mouth daily. Liver support     OVER THE COUNTER MEDICATION Take 3 tablets by mouth daily. Devil's Claw     OVER THE COUNTER MEDICATION Take 1 tablet by mouth daily. Ultra Noc     potassium chloride  SA (KLOR-CON  M) 10 MEQ tablet Take 1 tablet (10 mEq total) by mouth daily. 90 tablet 3   TURMERIC PO Take 1 tablet by mouth 2 (two) times daily. Curcumin Triple burn     Vitamin E 400 units TABS Take 400 Units by mouth daily.     No current facility-administered medications for this visit.    Allergies:   Ciprofloxacin, Hydrochlorothiazide, Sulfonamide derivatives, and Valsartan   Social History:  The patient  reports that she has never smoked. She has never used smokeless tobacco. She reports that she does not drink alcohol and does not use drugs.   Family History:   family history includes Cancer in her father; Diabetes in her maternal aunt; Heart disease in her mother; Osteoporosis in her mother; Stroke in her mother.    Review of Systems: Review of Systems   Constitutional:  Positive for malaise/fatigue.  HENT: Negative.    Respiratory: Negative.    Cardiovascular: Negative.   Gastrointestinal: Negative.   Musculoskeletal: Negative.   Neurological: Negative.   Psychiatric/Behavioral: Negative.    All other systems reviewed and are negative.  PHYSICAL EXAM: VS:  BP 104/60 (BP Location: Left Arm, Patient Position: Sitting, Cuff Size: Normal)   Pulse (!) 139   Ht 5\' 3"  (1.6 m)   Wt 117 lb 6 oz (53.2 kg)   LMP  (LMP Unknown)   SpO2 97%   BMI 20.79 kg/m  , BMI Body mass index is 20.79 kg/m. Constitutional:  oriented to person, place, and time. No distress.  HENT:  Head: Grossly normal Eyes:  no discharge. No scleral icterus.  Neck: No JVD, no carotid bruits  Cardiovascular: Irregularly irregular, tachycardic, no murmurs appreciated Pulmonary/Chest: Clear to auscultation bilaterally, no wheezes or rails Abdominal: Soft.  no distension.  no tenderness.  Musculoskeletal: Normal range of motion Neurological:  normal muscle tone. Coordination normal. No atrophy Skin: Skin warm and dry Psychiatric: normal affect, pleasant  Recent Labs: 05/29/2023: ALT 41 06/12/2023: Pro B Natriuretic peptide (BNP) 873.0 07/03/2023: TSH 4.77 07/14/2023: Hemoglobin 12.5; Platelets 157 08/11/2023: BUN 20; Creatinine, Ser 1.16; Potassium 4.2; Sodium 142    Lipid Panel Lab Results  Component Value Date   CHOL 115 05/29/2023   HDL 49.00 05/29/2023   LDLCALC 53 05/29/2023   TRIG 65.0 05/29/2023    Wt Readings from Last 3 Encounters:  08/19/23 117 lb 6 oz (53.2 kg)  08/13/23 120 lb 8 oz (54.7 kg)  08/07/23 118 lb (53.5 kg)      ASSESSMENT AND PLAN:  Paroxysmal atrial fibrillation (HCC) -  Recent cardioversion, with postconversion bradycardia lasting quite some time, approximately 1 hour  In atrial flutter today rate 130, Mild symptoms Continue amiodarone  100 twice daily metoprolol  succinate 50 twice daily - We have reached out to EP to move up her  appointment, currently scheduled June 3 - Could consider ablation versus AV node ablation with pacer  - Case discussed with Dr. Daneil Dunker, he will see her may 27th at 820 in the morning  Leg edema Remains on Lasix  daily Appears euvolemic,  very mild shortness of breath on exertion  HTN Blood pressure stable, orthostatics negative  Fall Prior history of falls, none recently, stressed importance of being careful, use a cane or walker if needed  HYPERLIPIDEMIA Continue cholesterol medication  Cerebrovascular accident (CVA) due to thrombosis of left middle cerebral artery (HCC) Prior history of stroke, paroxysmal atrial fibrillation Known cerebrovascular disease  Eliquis  2.5 twice daily age, weight For frequent falls may need to hold Eliquis   Gait instability Prior history of falls  Adjustment disorder Loss of husband 2022, lives with son   Orders Placed This Encounter  Procedures   EKG 12-Lead    Signed, Juanda Noon, M.D., Ph.D. 08/19/2023  [facility] [place], [place] [phone]

## 2023-08-19 ENCOUNTER — Ambulatory Visit: Attending: Cardiovascular Disease | Admitting: Cardiovascular Disease

## 2023-08-19 ENCOUNTER — Encounter: Payer: Self-pay | Admitting: Cardiovascular Disease

## 2023-08-19 VITALS — BP 104/60 | HR 139 | Ht 63.0 in | Wt 117.4 lb

## 2023-08-19 DIAGNOSIS — J90 Pleural effusion, not elsewhere classified: Secondary | ICD-10-CM

## 2023-08-19 DIAGNOSIS — R600 Localized edema: Secondary | ICD-10-CM

## 2023-08-19 DIAGNOSIS — I498 Other specified cardiac arrhythmias: Secondary | ICD-10-CM

## 2023-08-19 DIAGNOSIS — I10 Essential (primary) hypertension: Secondary | ICD-10-CM

## 2023-08-19 DIAGNOSIS — I5031 Acute diastolic (congestive) heart failure: Secondary | ICD-10-CM | POA: Diagnosis not present

## 2023-08-19 DIAGNOSIS — I483 Typical atrial flutter: Secondary | ICD-10-CM

## 2023-08-19 DIAGNOSIS — I4819 Other persistent atrial fibrillation: Secondary | ICD-10-CM | POA: Diagnosis not present

## 2023-08-19 DIAGNOSIS — R0602 Shortness of breath: Secondary | ICD-10-CM

## 2023-08-19 DIAGNOSIS — I63312 Cerebral infarction due to thrombosis of left middle cerebral artery: Secondary | ICD-10-CM

## 2023-08-19 NOTE — Patient Instructions (Signed)
 Medication Instructions:   Your Physician recommend you continue on your current medication as directed.     *If you need a refill on your cardiac medications before your next appointment, please call your pharmacy*  Lab Work: None ordered today. If you have labs (blood work) drawn today and your tests are completely normal, you will receive your results only by: MyChart Message (if you have MyChart) OR A paper copy in the mail If you have any lab test that is abnormal or we need to change your treatment, we will call you to review the results.  Testing/Procedures: None ordered today.  Follow-Up: At [facility], you and your health needs are our priority.  As part of our continuing mission to provide you with exceptional heart care, our providers are all part of one team.  This team includes your primary Cardiologist (physician) and Advanced Practice Providers or APPs (Physician Assistants and Nurse Practitioners) who all work together to provide you with the care you need, when you need it.

## 2023-08-20 DIAGNOSIS — K080 Exfoliation of teeth due to systemic causes: Secondary | ICD-10-CM | POA: Diagnosis not present

## 2023-08-25 NOTE — Progress Notes (Signed)
 Electrophysiology Office Note:   Date:  08/26/2023  ID:  [name], DOB March 21, 1941, MRN [id number]  Primary Cardiologist: Belva Boyden, MD Electrophysiologist: Ardeen Kohler, MD      History of Present Illness:   Karen WARSHAWSKY is a 83 y.o. female with h/o paroxysmal Afib, HTN, HLD, stroke March 2017 due to thrombosis of left middle cerebral arterty, moderate to severe bilateral proximal PCA stenosis, mild right cavernous and suprainclinoid ICA stenosis who is being seen today for evaluation of her atrial fibrillation.  Saw PCP 3/6 for sinus infection and pleural effusions. She was in AF and amiodarone  was increased by Dr. Adolphus Hoops instructions. The patient was last seen 06/13/23 and was in SB with prolonged QTC of . Amio was decreased to 100mg  daily. She came in for her echocardiogram on 4/14 and was found to be back in rapid AF. She saw Dr. Gollan, who set her up for cardioversion. She underwent successful cardioversion 4/15. Limited echo showed LVEF 25-30%, low normal RVSF, severely dilated atria, mild MR. Presented for follow up visit on 4/28 and was back in AF. Underwent second cardioversion on 08/13/23.   Discussed the use of AI scribe software for clinical note transcription with the patient, who gave verbal consent to proceed. History of Present Illness She underwent a cardioversion on Aug 13, 2023. By Aug 19, 2023, she was noted to be back out of rhythm. She is currently on amiodarone , but it has not been effective in maintaining normal rhythm. Her heart rate has been consistently high, around 130 beats per minute, even at rest. She feels okay at present but acknowledges a history of swelling in her legs, which she has corresponded with her increased burden of AF this year. She has experienced a challenging year with her condition, noting 'good days and bad days,' and sometimes feels like she 'can't do anything.' No new or acute complaints.    Review of systems complete and  found to be negative unless listed in HPI.   EP Information / Studies Reviewed:    EKG is ordered today. Personal review as below.  EKG Interpretation Date/Time:  Tuesday Aug 26 2023 08:22:41 EDT Ventricular Rate:  123 PR Interval:    QRS Duration:  96 QT Interval:  368 QTC Calculation: 526 R Axis:   35  Text Interpretation: Atrial fibrillation Nonspecific ST abnormality When compared with ECG of 19-Aug-2023 13:58,  Similar findings Confirmed by Ardeen Kohler [phone]) on 08/26/2023 8:47:04 AM   Echo 07/14/23:   1. Left ventricular ejection fraction, by estimation, is 25 to 30%. The  left ventricle has severely decreased function.   2. Right ventricular systolic function is low normal.   3. Left atrial size was severely dilated.   4. Right atrial size was severely dilated.   5. A small pericardial effusion is present.   6. The mitral valve is normal in structure. Mild mitral valve  regurgitation.   7. The aortic valve is tricuspid. Aortic valve regurgitation is not  visualized. Aortic valve sclerosis/calcification is present, without any  evidence of aortic stenosis.   8. The inferior vena cava is dilated in size with <50% respiratory  variability, suggesting right atrial pressure of 15 mmHg.   Risk Assessment/Calculations:    CHA2DS2-VASc Score = 8   This indicates a 10.8% annual risk of stroke. The patient's score is based upon: CHF History: 1 HTN History: 1 Diabetes History: 0 Stroke History: 2 Vascular Disease History: 1 Age Score: 2 Gender Score: 1  Physical Exam:   VS:  BP 118/76   Pulse (!) 123   Ht 5\' 3"  (1.6 m)   Wt 116 lb (52.6 kg)   LMP  (LMP Unknown)   SpO2 97%   BMI 20.55 kg/m    Wt Readings from Last 3 Encounters:  08/26/23 116 lb (52.6 kg)  08/19/23 117 lb 6 oz (53.2 kg)  08/13/23 120 lb 8 oz (54.7 kg)     GEN: Well nourished, well developed in no acute distress NECK: No JVD CARDIAC: Tachycardic, irregular RESPIRATORY:  Clear  to auscultation without rales, wheezing or rhonchi  ABDOMEN: Soft, non-distended EXTREMITIES:  No edema; No deformity   ASSESSMENT AND PLAN:    #. Paroxysmal atrial fibrillation: Increasing burden. Associated with drop in LVEF. For these reasons, we have prioritized a rhythm control strategy.  She had early recurrence after cardioversion on amiodarone .  Poorly rate controlled currently. #. Secondary hypercoagulable state due to atrial fibrillation: CHADSVASC score of 8. - Increase metoprolol  XL to 75mg  BID given persistently elevated rates. - Continue Eliquis  2.5 mg twice daily. Dose reduced for age and weight.  - Discussed treatment options today for AF including antiarrhythmic drug therapy and ablation. Discussed risks, recovery and likelihood of success with each treatment strategy. Risk, benefits, and alternatives to EP study and ablation for afib were discussed. These risks include but are not limited to stroke, bleeding, vascular damage, tamponade, perforation, damage to the esophagus, lungs, phrenic nerve and other structures, pulmonary vein stenosis, worsening renal function, coronary vasospasm and death.  Discussed potential need for repeat ablation procedures and antiarrhythmic drugs after an initial ablation. The patient understands these risk and wishes to proceed.  We will therefore proceed with catheter ablation at the next available time.  Carto, ICE, anesthesia are requested for the procedure.  Will also obtain CT PV protocol prior to the procedure to exclude LAA thrombus and further evaluate atrial anatomy.  #.  High risk medication use: Antiarrhythmic drug therapy - amiodarone . LFTs normal 05/29/23. TSH normal 4.77. - Continue 200mg  daily as bridge to ablation, mainly assisting in rate control at this point. Will continue for 3 months after ablation.  #.  Chronic systolic heart failure: Newly reduced LVEF in the setting of atrial fibrillation, EF 25-30%. Well compensated on exam  today.  - Continue follow-up with general cardiology. Continue GDMT regimen of empagliflozin  10mg  daily, metoprolol  XL 75mg  daily. - Will need repeat echo once able to maintain sinus.  Follow up with Dr. Daneil Dunker 3 months after ablation.  Signed, Ardeen Kohler, MD

## 2023-08-25 NOTE — H&P (View-Only) (Signed)
 Electrophysiology Office Note:   Date:  08/26/2023  ID:  Karen Bowman, DOB March 21, 1941, MRN [id number]  Primary Cardiologist: Belva Boyden, MD Electrophysiologist: Ardeen Kohler, MD      History of Present Illness:   Karen Bowman is a 83 y.o. female with h/o paroxysmal Afib, HTN, HLD, stroke March 2017 due to thrombosis of left middle cerebral arterty, moderate to severe bilateral proximal PCA stenosis, mild right cavernous and suprainclinoid ICA stenosis who is being seen today for evaluation of her atrial fibrillation.  Saw PCP 3/6 for sinus infection and pleural effusions. She was in AF and amiodarone  was increased by Dr. Adolphus Hoops instructions. The patient was last seen 06/13/23 and was in SB with prolonged QTC of . Amio was decreased to 100mg  daily. She came in for her echocardiogram on 4/14 and was found to be back in rapid AF. She saw Dr. Gollan, who set her up for cardioversion. She underwent successful cardioversion 4/15. Limited echo showed LVEF 25-30%, low normal RVSF, severely dilated atria, mild MR. Presented for follow up visit on 4/28 and was back in AF. Underwent second cardioversion on 08/13/23.   Discussed the use of AI scribe software for clinical note transcription with the patient, who gave verbal consent to proceed. History of Present Illness She underwent a cardioversion on Aug 13, 2023. By Aug 19, 2023, she was noted to be back out of rhythm. She is currently on amiodarone , but it has not been effective in maintaining normal rhythm. Her heart rate has been consistently high, around 130 beats per minute, even at rest. She feels okay at present but acknowledges a history of swelling in her legs, which she has corresponded with her increased burden of AF this year. She has experienced a challenging year with her condition, noting 'good days and bad days,' and sometimes feels like she 'can't do anything.' No new or acute complaints.    Review of systems complete and  found to be negative unless listed in HPI.   EP Information / Studies Reviewed:    EKG is ordered today. Personal review as below.  EKG Interpretation Date/Time:  Tuesday Aug 26 2023 08:22:41 EDT Ventricular Rate:  123 PR Interval:    QRS Duration:  96 QT Interval:  368 QTC Calculation: 526 R Axis:   35  Text Interpretation: Atrial fibrillation Nonspecific ST abnormality When compared with ECG of 19-Aug-2023 13:58,  Similar findings Confirmed by Ardeen Kohler [phone]) on 08/26/2023 8:47:04 AM   Echo 07/14/23:   1. Left ventricular ejection fraction, by estimation, is 25 to 30%. The  left ventricle has severely decreased function.   2. Right ventricular systolic function is low normal.   3. Left atrial size was severely dilated.   4. Right atrial size was severely dilated.   5. A small pericardial effusion is present.   6. The mitral valve is normal in structure. Mild mitral valve  regurgitation.   7. The aortic valve is tricuspid. Aortic valve regurgitation is not  visualized. Aortic valve sclerosis/calcification is present, without any  evidence of aortic stenosis.   8. The inferior vena cava is dilated in size with <50% respiratory  variability, suggesting right atrial pressure of 15 mmHg.   Risk Assessment/Calculations:    CHA2DS2-VASc Score = 8   This indicates a 10.8% annual risk of stroke. The patient's score is based upon: CHF History: 1 HTN History: 1 Diabetes History: 0 Stroke History: 2 Vascular Disease History: 1 Age Score: 2 Gender Score: 1  Physical Exam:   VS:  BP 118/76   Pulse (!) 123   Ht 5\' 3"  (1.6 m)   Wt 116 lb (52.6 kg)   LMP  (LMP Unknown)   SpO2 97%   BMI 20.55 kg/m    Wt Readings from Last 3 Encounters:  08/26/23 116 lb (52.6 kg)  08/19/23 117 lb 6 oz (53.2 kg)  08/13/23 120 lb 8 oz (54.7 kg)     GEN: Well nourished, well developed in no acute distress NECK: No JVD CARDIAC: Tachycardic, irregular RESPIRATORY:  Clear  to auscultation without rales, wheezing or rhonchi  ABDOMEN: Soft, non-distended EXTREMITIES:  No edema; No deformity   ASSESSMENT AND PLAN:    #. Paroxysmal atrial fibrillation: Increasing burden. Associated with drop in LVEF. For these reasons, we have prioritized a rhythm control strategy.  She had early recurrence after cardioversion on amiodarone .  Poorly rate controlled currently. #. Secondary hypercoagulable state due to atrial fibrillation: CHADSVASC score of 8. - Increase metoprolol  XL to 75mg  BID given persistently elevated rates. - Continue Eliquis  2.5 mg twice daily. Dose reduced for age and weight.  - Discussed treatment options today for AF including antiarrhythmic drug therapy and ablation. Discussed risks, recovery and likelihood of success with each treatment strategy. Risk, benefits, and alternatives to EP study and ablation for afib were discussed. These risks include but are not limited to stroke, bleeding, vascular damage, tamponade, perforation, damage to the esophagus, lungs, phrenic nerve and other structures, pulmonary vein stenosis, worsening renal function, coronary vasospasm and death.  Discussed potential need for repeat ablation procedures and antiarrhythmic drugs after an initial ablation. The patient understands these risk and wishes to proceed.  We will therefore proceed with catheter ablation at the next available time.  Carto, ICE, anesthesia are requested for the procedure.  Will also obtain CT PV protocol prior to the procedure to exclude LAA thrombus and further evaluate atrial anatomy.  #.  High risk medication use: Antiarrhythmic drug therapy - amiodarone . LFTs normal 05/29/23. TSH normal 4.77. - Continue 200mg  daily as bridge to ablation, mainly assisting in rate control at this point. Will continue for 3 months after ablation.  #.  Chronic systolic heart failure: Newly reduced LVEF in the setting of atrial fibrillation, EF 25-30%. Well compensated on exam  today.  - Continue follow-up with general cardiology. Continue GDMT regimen of empagliflozin  10mg  daily, metoprolol  XL 75mg  daily. - Will need repeat echo once able to maintain sinus.  Follow up with Dr. Daneil Dunker 3 months after ablation.  Signed, Ardeen Kohler, MD

## 2023-08-26 ENCOUNTER — Ambulatory Visit: Attending: Cardiology | Admitting: Cardiology

## 2023-08-26 ENCOUNTER — Other Ambulatory Visit: Payer: Self-pay

## 2023-08-26 ENCOUNTER — Encounter: Payer: Self-pay | Admitting: Cardiology

## 2023-08-26 ENCOUNTER — Institutional Professional Consult (permissible substitution): Admitting: Cardiology

## 2023-08-26 VITALS — BP 118/76 | HR 123 | Ht 63.0 in | Wt 116.0 lb

## 2023-08-26 DIAGNOSIS — D6869 Other thrombophilia: Secondary | ICD-10-CM

## 2023-08-26 DIAGNOSIS — Z79899 Other long term (current) drug therapy: Secondary | ICD-10-CM | POA: Diagnosis not present

## 2023-08-26 DIAGNOSIS — I5022 Chronic systolic (congestive) heart failure: Secondary | ICD-10-CM | POA: Diagnosis not present

## 2023-08-26 DIAGNOSIS — I4819 Other persistent atrial fibrillation: Secondary | ICD-10-CM

## 2023-08-26 MED ORDER — METOPROLOL SUCCINATE ER 50 MG PO TB24: 75.0000 mg | ORAL_TABLET | Freq: Every day | ORAL | 3 refills | Status: DC

## 2023-08-26 NOTE — Patient Instructions (Signed)
 Medication Instructions:  Your physician has recommended you make the following change in your medication:  1) INCREASE Metoprolol  to 75 mg twice daily  *If you need a refill on your cardiac medications before your next appointment, please call your pharmacy*  Lab Work: TODAY: BMET and CBC If you have labs (blood work) drawn today and your tests are completely normal, you will receive your results only by: MyChart Message (if you have MyChart) OR A paper copy in the mail If you have any lab test that is abnormal or we need to change your treatment, we will call you to review the results.  Testing/Procedures: Cardiac CT  Your physician has requested that you have cardiac CT. Cardiac computed tomography (CT) is a painless test that uses an x-ray machine to take clear, detailed pictures of your heart. For further information please visit [email or web address] Please follow instruction sheet as given. We will call you to schedule your CT scan. It will be done about three weeks prior to your ablation.  Ablation Your physician has recommended that you have an ablation. Catheter ablation is a medical procedure used to treat some cardiac arrhythmias (irregular heartbeats). During catheter ablation, a long, thin, flexible tube is put into a blood vessel in your groin (upper thigh), or neck. This tube is called an ablation catheter. It is then guided to your heart through the blood vessel. Radio frequency waves destroy small areas of heart tissue where abnormal heartbeats may cause an arrhythmia to start. Please see the instruction sheet given to you today.  Follow-Up: At [facility], you and your health needs are our priority.  As part of our continuing mission to provide you with exceptional heart care, our providers are all part of one team.  This team includes your primary Cardiologist (physician) and Advanced Practice Providers or APPs (Physician Assistants and Nurse Practitioners) who all  work together to provide you with the care you need, when you need it.  Your next appointment:   We will contact you to schedule your post-procedure appointments

## 2023-08-27 ENCOUNTER — Ambulatory Visit: Payer: Self-pay | Admitting: Cardiology

## 2023-08-27 DIAGNOSIS — K080 Exfoliation of teeth due to systemic causes: Secondary | ICD-10-CM | POA: Diagnosis not present

## 2023-08-27 LAB — CBC
Hematocrit: 46.1 % (ref 34.0–46.6)
Hemoglobin: 13.9 g/dL (ref 11.1–15.9)
MCH: 27.7 pg (ref 26.6–33.0)
MCHC: 30.2 g/dL — ABNORMAL LOW (ref 31.5–35.7)
MCV: 92 fL (ref 79–97)
Platelets: 210 10*3/uL (ref 150–450)
RBC: 5.01 x10E6/uL (ref 3.77–5.28)
RDW: 16.2 % — ABNORMAL HIGH (ref 11.7–15.4)
WBC: 6 10*3/uL (ref 3.4–10.8)

## 2023-08-27 LAB — BASIC METABOLIC PANEL WITH GFR
BUN/Creatinine Ratio: 16 (ref 12–28)
BUN: 18 mg/dL (ref 8–27)
CO2: 21 mmol/L (ref 20–29)
Calcium: 9.5 mg/dL (ref 8.7–10.3)
Chloride: 102 mmol/L (ref 96–106)
Creatinine, Ser: 1.12 mg/dL — ABNORMAL HIGH (ref 0.57–1.00)
Glucose: 93 mg/dL (ref 70–99)
Potassium: 4.3 mmol/L (ref 3.5–5.2)
Sodium: 140 mmol/L (ref 134–144)
eGFR: 50 mL/min/{1.73_m2} — ABNORMAL LOW (ref >59)

## 2023-08-28 ENCOUNTER — Telehealth: Payer: Self-pay | Admitting: Medical

## 2023-08-28 DIAGNOSIS — I480 Paroxysmal atrial fibrillation: Secondary | ICD-10-CM | POA: Diagnosis not present

## 2023-08-28 NOTE — Telephone Encounter (Signed)
   Cardiac Monitor Alert  Date of alert:  08/28/2023   Patient Name: Karen Bowman  DOB: Dec 10, 1940  MRN: [id number]   [facility] Cardiologist: Belva Boyden, MD  [facility] EP:  Ardeen Kohler, MD    Monitor Information: Long Term Monitor [ZioXT]  Reason:  Afib Ordering provider:  Cadence Gennaro Khat, PA-C   Alert Pause(s) - Longest:  8.3 seconds on 08/13/23 at 7:40 am This is the 1st alert for this rhythm.    The patient was contacted today.  She reported that she typically has a bowel movement in the morning, then goes back to bed. No symptoms of dizziness or lightheadedness were reported. She noted that her heart rate tends to increase after a bowel movement, but all of her symptoms remained consistent throughout the duration of her heart monitor. She does not recall experiencing any particular symptoms on that specific day .  Arrhythmia, symptoms and history reviewed with Suzann Riddle, NP.   Plan:  Continue current plans for now but NP will consult with Dr. Daneil Dunker for his recommendations.     Chapman Commodore, RN  08/28/2023 3:49 PM

## 2023-08-28 NOTE — Telephone Encounter (Signed)
 Tia from [employer] calling w/ Abn Zio

## 2023-08-28 NOTE — Telephone Encounter (Signed)
 Ardeen Kohler, MD  You; Riddle, Suzann, NP; Chauvigne, Carlyle, RNJust now (4:51 PM)   JP That pause was at the time of her cardioversion. No changes needed.  Thanks, [employer]

## 2023-09-02 ENCOUNTER — Ambulatory Visit: Admitting: Cardiology

## 2023-09-08 ENCOUNTER — Ambulatory Visit (HOSPITAL_COMMUNITY)
Admission: RE | Admit: 2023-09-08 | Discharge: 2023-09-08 | Disposition: A | Discharge status: Home / Self Care | Source: Ambulatory Visit | Attending: Cardiology | Admitting: Cardiology

## 2023-09-08 DIAGNOSIS — J9811 Atelectasis: Secondary | ICD-10-CM | POA: Insufficient documentation

## 2023-09-08 DIAGNOSIS — I700 Atherosclerosis of aorta: Secondary | ICD-10-CM | POA: Insufficient documentation

## 2023-09-08 DIAGNOSIS — I2510 Atherosclerotic heart disease of native coronary artery without angina pectoris: Secondary | ICD-10-CM | POA: Insufficient documentation

## 2023-09-08 DIAGNOSIS — I4819 Other persistent atrial fibrillation: Secondary | ICD-10-CM | POA: Insufficient documentation

## 2023-09-08 DIAGNOSIS — J90 Pleural effusion, not elsewhere classified: Secondary | ICD-10-CM | POA: Diagnosis not present

## 2023-09-08 MED ORDER — IOHEXOL 350 MG/ML SOLN
100.0000 mL | Freq: Once | INTRAVENOUS | Status: AC | PRN
  Administered 2023-09-08: 100 mL via INTRAVENOUS

## 2023-09-10 DIAGNOSIS — K080 Exfoliation of teeth due to systemic causes: Secondary | ICD-10-CM | POA: Diagnosis not present

## 2023-09-12 ENCOUNTER — Encounter: Payer: Self-pay | Admitting: Emergency Medicine

## 2023-09-14 DIAGNOSIS — I480 Paroxysmal atrial fibrillation: Secondary | ICD-10-CM

## 2023-09-15 ENCOUNTER — Ambulatory Visit: Payer: Medicare Other | Admitting: Cardiovascular Disease

## 2023-09-18 NOTE — Anesthesia Preprocedure Evaluation (Signed)
 Anesthesia Evaluation  Patient identified by MRN, date of birth, ID band Patient awake    Reviewed: Allergy & Precautions, H&P , NPO status , Patient's Chart, lab work & pertinent test results  Airway Mallampati: II  TM Distance: >3 FB Neck ROM: full    Dental no notable dental hx.    Pulmonary shortness of breath   Pulmonary exam normal        Cardiovascular hypertension, Pt. on medications + Peripheral Vascular Disease and +CHF  + dysrhythmias Atrial Fibrillation (-) pacemaker+ Valvular Problems/Murmurs  Rhythm:Irregular Rate:Tachycardia - Systolic murmurs, - Diastolic murmurs and - Peripheral Edema    Neuro/Psych  PSYCHIATRIC DISORDERS Anxiety     CVA, No Residual Symptoms    GI/Hepatic negative GI ROS, Neg liver ROS,,,  Endo/Other  Hypothyroidism    Renal/GU      Musculoskeletal  (+) Arthritis ,    Abdominal   Peds  Hematology negative hematology ROS (+)   Anesthesia Other Findings   Reproductive/Obstetrics negative OB ROS                             Anesthesia Physical Anesthesia Plan  ASA: 3  Anesthesia Plan: General   Post-op Pain Management: Minimal or no pain anticipated   Induction: Intravenous  PONV Risk Score and Plan: 3 and Propofol  infusion and Ondansetron   Airway Management Planned: Oral ETT  Additional Equipment: None  Intra-op Plan:   Post-operative Plan: Extubation in OR  Informed Consent: I have reviewed the patients History and Physical, chart, labs and discussed the procedure including the risks, benefits and alternatives for the proposed anesthesia with the patient or authorized representative who has indicated his/her understanding and acceptance.     Dental advisory given  Plan Discussed with: CRNA and Anesthesiologist  Anesthesia Plan Comments:        Anesthesia Quick Evaluation

## 2023-09-18 NOTE — Pre-Procedure Instructions (Signed)
 Attempted to call patient regarding procedure.  Left voicemail Instructed patient on the following items: Arrival time 0800 Nothing to eat or drink after midnight No meds AM of procedure Responsible person to drive you home and stay with you for 24 hrs  Have you missed any doses of anti-coagulant Eliquis - should be taken twice a day.  If you have missed any doses please let us  know.  Don't dose morning pocedure

## 2023-09-19 ENCOUNTER — Other Ambulatory Visit: Payer: Self-pay

## 2023-09-19 ENCOUNTER — Ambulatory Visit (HOSPITAL_COMMUNITY): Payer: Self-pay | Admitting: Anesthesiology

## 2023-09-19 ENCOUNTER — Encounter (HOSPITAL_COMMUNITY)
Admission: RE | Disposition: A | Discharge status: Home / Self Care | Payer: Self-pay | Source: Home / Self Care | Attending: Cardiology

## 2023-09-19 ENCOUNTER — Ambulatory Visit (HOSPITAL_COMMUNITY)
Admission: RE | Admit: 2023-09-19 | Discharge: 2023-09-19 | Disposition: A | Discharge status: Home / Self Care | Attending: Cardiology | Admitting: Cardiology

## 2023-09-19 DIAGNOSIS — Z8673 Personal history of transient ischemic attack (TIA), and cerebral infarction without residual deficits: Secondary | ICD-10-CM | POA: Diagnosis not present

## 2023-09-19 DIAGNOSIS — I3139 Other pericardial effusion (noninflammatory): Secondary | ICD-10-CM | POA: Insufficient documentation

## 2023-09-19 DIAGNOSIS — E785 Hyperlipidemia, unspecified: Secondary | ICD-10-CM | POA: Diagnosis not present

## 2023-09-19 DIAGNOSIS — I4819 Other persistent atrial fibrillation: Secondary | ICD-10-CM

## 2023-09-19 DIAGNOSIS — I5022 Chronic systolic (congestive) heart failure: Secondary | ICD-10-CM | POA: Diagnosis not present

## 2023-09-19 DIAGNOSIS — D6869 Other thrombophilia: Secondary | ICD-10-CM | POA: Diagnosis not present

## 2023-09-19 DIAGNOSIS — E039 Hypothyroidism, unspecified: Secondary | ICD-10-CM

## 2023-09-19 DIAGNOSIS — Z79899 Other long term (current) drug therapy: Secondary | ICD-10-CM | POA: Insufficient documentation

## 2023-09-19 DIAGNOSIS — I110 Hypertensive heart disease with heart failure: Secondary | ICD-10-CM

## 2023-09-19 DIAGNOSIS — I509 Heart failure, unspecified: Secondary | ICD-10-CM

## 2023-09-19 DIAGNOSIS — Z7901 Long term (current) use of anticoagulants: Secondary | ICD-10-CM | POA: Diagnosis not present

## 2023-09-19 LAB — POCT ACTIVATED CLOTTING TIME: Activated Clotting Time: 250 s

## 2023-09-19 MED ORDER — FENTANYL CITRATE (PF) 250 MCG/5ML IJ SOLN
INTRAMUSCULAR | Status: DC | PRN
  Administered 2023-09-19: 100 ug via INTRAVENOUS

## 2023-09-19 MED ORDER — EPHEDRINE SULFATE (PRESSORS) 50 MG/ML IJ SOLN
INTRAMUSCULAR | Status: DC | PRN
  Administered 2023-09-19 (×2): 5 mg via INTRAVENOUS

## 2023-09-19 MED ORDER — PHENYLEPHRINE 80 MCG/ML (10ML) SYRINGE FOR IV PUSH (FOR BLOOD PRESSURE SUPPORT)
PREFILLED_SYRINGE | INTRAVENOUS | Status: DC | PRN
  Administered 2023-09-19 (×5): 80 ug via INTRAVENOUS
  Administered 2023-09-19 (×2): 160 ug via INTRAVENOUS

## 2023-09-19 MED ORDER — SODIUM CHLORIDE 0.9% FLUSH: 3.0000 mL | INTRAVENOUS | Status: DC | PRN

## 2023-09-19 MED ORDER — SUGAMMADEX SODIUM 200 MG/2ML IV SOLN
INTRAVENOUS | Status: DC | PRN
  Administered 2023-09-19: 150 mg via INTRAVENOUS

## 2023-09-19 MED ORDER — ATROPINE SULFATE 1 MG/10ML IJ SOSY
PREFILLED_SYRINGE | INTRAMUSCULAR | Status: DC | PRN
  Administered 2023-09-19: 1 mg via INTRAVENOUS

## 2023-09-19 MED ORDER — HEPARIN SODIUM (PORCINE) 1000 UNIT/ML IJ SOLN
INTRAMUSCULAR | Status: DC | PRN
  Administered 2023-09-19: 10000 [IU] via INTRAVENOUS
  Administered 2023-09-19: 5000 [IU] via INTRAVENOUS

## 2023-09-19 MED ORDER — HEPARIN (PORCINE) IN NACL 1000-0.9 UT/500ML-% IV SOLN
INTRAVENOUS | Status: DC | PRN
Start: 2023-09-19 — End: 2023-09-19
  Administered 2023-09-19 (×3): 500 mL

## 2023-09-19 MED ORDER — PROPOFOL 10 MG/ML IV BOLUS
INTRAVENOUS | Status: DC | PRN
  Administered 2023-09-19: 150 mg via INTRAVENOUS

## 2023-09-19 MED ORDER — SODIUM CHLORIDE 0.9 % IV SOLN: 250.0000 mL | INTRAVENOUS | Status: DC | PRN

## 2023-09-19 MED ORDER — ONDANSETRON HCL 4 MG/2ML IJ SOLN
INTRAMUSCULAR | Status: DC | PRN
  Administered 2023-09-19: 4 mg via INTRAVENOUS

## 2023-09-19 MED ORDER — PROTAMINE SULFATE 10 MG/ML IV SOLN
INTRAVENOUS | Status: DC | PRN
  Administered 2023-09-19: 35 mg via INTRAVENOUS

## 2023-09-19 MED ORDER — ROCURONIUM BROMIDE 10 MG/ML (PF) SYRINGE
PREFILLED_SYRINGE | INTRAVENOUS | Status: DC | PRN
  Administered 2023-09-19: 50 mg via INTRAVENOUS

## 2023-09-19 MED ORDER — SODIUM CHLORIDE 0.9% FLUSH
3.0000 mL | Freq: Two times a day (BID) | INTRAVENOUS | Status: DC
Start: 2023-09-19 — End: 2023-09-19

## 2023-09-19 MED ORDER — PHENYLEPHRINE HCL-NACL 20-0.9 MG/250ML-% IV SOLN
INTRAVENOUS | Status: DC | PRN
  Administered 2023-09-19: 20 ug/min via INTRAVENOUS

## 2023-09-19 MED ORDER — APIXABAN 2.5 MG PO TABS
2.5000 mg | ORAL_TABLET | Freq: Once | ORAL | Status: AC
  Administered 2023-09-19: 2.5 mg via ORAL
  Filled 2023-09-19: qty 1

## 2023-09-19 MED ORDER — METOPROLOL SUCCINATE ER 50 MG PO TB24: 50.0000 mg | ORAL_TABLET | Freq: Every day | ORAL | 3 refills | Status: AC

## 2023-09-19 MED ORDER — ONDANSETRON HCL 4 MG/2ML IJ SOLN: 4.0000 mg | Freq: Four times a day (QID) | INTRAMUSCULAR | Status: DC | PRN

## 2023-09-19 MED ORDER — FENTANYL CITRATE (PF) 100 MCG/2ML IJ SOLN
INTRAMUSCULAR | Status: AC
  Filled 2023-09-19: qty 2

## 2023-09-19 MED ORDER — ACETAMINOPHEN 325 MG PO TABS: 650.0000 mg | ORAL_TABLET | ORAL | Status: DC | PRN

## 2023-09-19 MED ORDER — DEXAMETHASONE SODIUM PHOSPHATE 10 MG/ML IJ SOLN
INTRAMUSCULAR | Status: DC | PRN
  Administered 2023-09-19: 5 mg via INTRAVENOUS

## 2023-09-19 MED ORDER — SODIUM CHLORIDE 0.9 % IV SOLN: INTRAVENOUS | Status: DC

## 2023-09-19 NOTE — Discharge Instructions (Signed)

## 2023-09-19 NOTE — Transfer of Care (Signed)
 Immediate Anesthesia Transfer of Care Note  Patient: Karen Bowman  Procedure(s) Performed: ATRIAL FIBRILLATION ABLATION  Patient Location: PACU  Anesthesia Type:General  Level of Consciousness: awake, alert , and oriented  Airway & Oxygen Therapy: Patient Spontanous Breathing and Patient connected to face mask oxygen  Post-op Assessment: Report given to RN and Post -op Vital signs reviewed and stable  Post vital signs: Reviewed and stable  Last Vitals:  Vitals Value Taken Time  BP    Temp    Pulse 51 09/19/23 12:54  Resp 14 09/19/23 12:54  SpO2 94 % 09/19/23 12:54  Vitals shown include unfiled device data.  Last Pain:  Vitals:   09/19/23 0917  TempSrc:   PainSc: 0-No pain         Complications: No notable events documented.

## 2023-09-19 NOTE — Interval H&P Note (Signed)
 History and Physical Interval Note:  09/19/2023 10:31 AM  Karen Bowman  has presented today for surgery, with the diagnosis of atrial fibrillation.  The various methods of treatment have been discussed with the patient and family. After consideration of risks, benefits and other options for treatment, the patient has consented to  Procedure(s): ATRIAL FIBRILLATION ABLATION (N/A) as a surgical intervention.  The patient's history has been reviewed, patient examined, no change in status, stable for surgery.  I have reviewed the patient's chart and labs.  Questions were answered to the patient's satisfaction.     Ardeen Kohler

## 2023-09-19 NOTE — Anesthesia Procedure Notes (Signed)
 Procedure Name: Intubation Date/Time: 09/19/2023 10:59 AM  Performed by: Guyla Bless J, CRNAPre-anesthesia Checklist: Patient identified, Emergency Drugs available, Suction available and Patient being monitored Patient Re-evaluated:Patient Re-evaluated prior to induction Oxygen Delivery Method: Circle System Utilized Preoxygenation: Pre-oxygenation with 100% oxygen Induction Type: IV induction Ventilation: Mask ventilation without difficulty Laryngoscope Size: Glidescope and 3 Grade View: Grade I Tube type: Oral Tube size: 7.0 mm Number of attempts: 1 Airway Equipment and Method: Stylet and Oral airway Placement Confirmation: ETT inserted through vocal cords under direct vision, positive ETCO2 and breath sounds checked- equal and bilateral Secured at: 21 cm Tube secured with: Tape Dental Injury: Teeth and Oropharynx as per pre-operative assessment

## 2023-09-20 ENCOUNTER — Encounter (HOSPITAL_COMMUNITY): Payer: Self-pay | Admitting: Cardiology

## 2023-09-20 NOTE — Anesthesia Postprocedure Evaluation (Signed)
 Anesthesia Post Note  Patient: Karen Bowman  Procedure(s) Performed: ATRIAL FIBRILLATION ABLATION     Patient location during evaluation: PACU Anesthesia Type: General Level of consciousness: awake and alert Pain management: pain level controlled Vital Signs Assessment: post-procedure vital signs reviewed and stable Respiratory status: spontaneous breathing, nonlabored ventilation, respiratory function stable and patient connected to nasal cannula oxygen Cardiovascular status: blood pressure returned to baseline and stable Postop Assessment: no apparent nausea or vomiting Anesthetic complications: no   No notable events documented.  Last Vitals:  Vitals:   09/19/23 1600 09/19/23 1601  BP: 116/69   Pulse: (!) 56 (!) 56  Resp: 18 14  Temp:    SpO2: (!) 88% 90%    Last Pain:  Vitals:   09/19/23 1425  TempSrc:   PainSc: 0-No pain   Pain Goal:                   Cassiel Fernandez

## 2023-09-22 ENCOUNTER — Telehealth (HOSPITAL_COMMUNITY): Payer: Self-pay

## 2023-09-22 NOTE — Telephone Encounter (Signed)
 Spoke with patient to complete post procedure follow up call.  Patient reports no complications with groin sites.   Instructions reviewed with patient:  It is normal to have bruising, tenderness, mild swelling, and a pea or marble sized lump/knot at the groin site which can take up to three months to resolve.  Get help right away if you notice sudden swelling at the puncture site.  Check your puncture site every day for signs of infection: fever, redness, swelling, pus drainage, warmth, foul odor or excessive pain. If this occurs, please call the office at [phone], to speak with the nurse. Get help right away if your puncture site is bleeding and the bleeding does not stop after applying firm pressure to the area.  You may continue to have skipped beats/ atrial fibrillation during the first several months after your procedure.  It is very important not to miss any doses of your blood thinner Eliquis .  You will follow up with the APP on 10/20/23 and follow up with the APP on 12/22/23 after your procedure.   Patient verbalized understanding to all instructions provided.

## 2023-09-24 MED FILL — Fentanyl Citrate Preservative Free (PF) Inj 100 MCG/2ML: INTRAMUSCULAR | Qty: 2 | Status: AC

## 2023-10-04 ENCOUNTER — Other Ambulatory Visit: Payer: Self-pay | Admitting: Cardiovascular Disease

## 2023-10-08 ENCOUNTER — Other Ambulatory Visit: Payer: Self-pay | Admitting: Cardiovascular Disease

## 2023-10-10 DIAGNOSIS — K080 Exfoliation of teeth due to systemic causes: Secondary | ICD-10-CM | POA: Diagnosis not present

## 2023-10-15 ENCOUNTER — Other Ambulatory Visit: Payer: Self-pay | Admitting: Cardiovascular Disease

## 2023-10-15 DIAGNOSIS — I480 Paroxysmal atrial fibrillation: Secondary | ICD-10-CM

## 2023-10-15 NOTE — Telephone Encounter (Signed)
 Eliquis  2.5mg  refill request received. Patient is 82 years old, weight-55.3kg, Crea-1.12 on 08/26/23, Diagnosis-Afib, and last seen by Dr. Kennyth on 08/26/23. Dose is appropriate based on dosing criteria. Will send in refill to requested pharmacy.

## 2023-10-19 NOTE — Progress Notes (Unsigned)
 Electrophysiology Clinic Note    Date:  10/20/2023  Patient ID:  Karen Bowman, DOB 13-Jul-1940, MRN [id number] PCP:  Avelina Greig BRAVO, MD  Cardiologist:  Evalene Lunger, MD   Cardiology APP:  Donette Ellouise LABOR, FNP  Electrophysiologist:  Fonda Kitty, MD  Electrophysiology APP:  Dwanna Goshert, NP     Discussed the use of AI scribe software for clinical note transcription with the patient, who gave verbal consent to proceed.   Patient Profile    Chief Complaint: AF ablation follow-up  History of Present Illness: Karen Bowman is a 83 y.o. female with PMH notable for parox AFib, atrial flutter, HFrEF, HTN, HLD, CVA; seen today for Fonda Kitty, MD for routine electrophysiology follow-up s/p Ablation.  She is s/p AF ablation w isolation of pulm veins, posterior wall on 09/19/2023 by Dr. Kitty. Amiodarone  was stopped post-procedurally.   On follow-up today, she feels her heart rhythm is quieter since the ablation. She is not aware of any AF episodes. She is tolerating metoprolol  well. Her eliquis  was briefly interrupted by pharmacy not having the medication in stock. She was very anxious during this time.   She has no bleeding concerns on eliquis .  Her bilateral groin sites are completely healed, no bruising or swelling  She continues to be plagued by body pain limiting her sleep and acitivites. This is not new or worsened since her ablation.    Arrhythmia/Device History Amiodarone , stopped after ablation    ROS:  Please see the history of present illness. All other systems are reviewed and otherwise negative.    Physical Exam    VS:  BP 124/64 (BP Location: Left Arm, Patient Position: Sitting, Cuff Size: Normal)   Pulse 63   Ht 5' 3 (1.6 m)   Wt 114 lb 3.2 oz (51.8 kg)   LMP  (LMP Unknown)   SpO2 98%   BMI 20.23 kg/m  BMI: Body mass index is 20.23 kg/m.      Wt Readings from Last 3 Encounters:  10/20/23 114 lb 3.2 oz (51.8 kg)  09/19/23 122 lb  (55.3 kg)  08/26/23 116 lb (52.6 kg)     GEN- The patient is frail-appearing, alert and oriented x 3 today.   Lungs- Clear to ausculation bilaterally, normal work of breathing.  Heart- Regular rate and rhythm, no murmurs, rubs or gallops Extremities- No peripheral edema, warm, dry    Studies Reviewed   Previous EP, cardiology notes.    EKG is ordered. Personal review of EKG from today shows:    EKG Interpretation Date/Time:  Monday October 20 2023 13:25:43 EDT Ventricular Rate:  63 PR Interval:  142 QRS Duration:  90 QT Interval:  456 QTC Calculation: 466 R Axis:   -8  Text Interpretation: Normal sinus rhythm Minimal voltage criteria for LVH, may be normal variant ( Cornell product ) Confirmed by Stefani Baik [phone]) on 10/20/2023 1:28:52 PM     Cardiac CT, 09/08/2023 1. There is normal pulmonary vein drainage into the left atrium with ostial measurements above.  2. There is no thrombus in the left atrial appendage.  3. The esophagus runs in the left atrial midline and is not in proximity to any of the pulmonary vein ostia.  4. No PFO/ASD.  5. Normal coronary origin. Right dominance.  6. CAC score of 287 which is 69th percentile for age-, race-, and sex-matched controls.  7. Aortic atherosclerosis.  8. Mild aortic valve calcification.  9. Mitral annular calcification.  TTE, 07/14/2023  1. Left ventricular ejection fraction, by estimation, is 25 to 30%. The left ventricle has severely decreased function.   2. Right ventricular systolic function is low normal.   3. Left atrial size was severely dilated.   4. Right atrial size was severely dilated.   5. A small pericardial effusion is present.   6. The mitral valve is normal in structure. Mild mitral valve regurgitation.   7. The aortic valve is tricuspid. Aortic valve regurgitation is not visualized. Aortic valve sclerosis/calcification is present, without any evidence of aortic stenosis.   8. The inferior vena cava is dilated  in size with <50% respiratory variability, suggesting right atrial pressure of 15 mmHg.   TTE, 06/28/2015 - Left ventricle: The cavity size was normal. Wall thickness was increased in a pattern of mild LVH. Systolic function was normal. The estimated ejection fraction was in the range of 55% to 60%. Wall motion was normal; there were no regional wall motion    abnormalities.  - Mitral valve: Moderately calcified annulus. Moderately thickened, moderately calcified leaflets .  - Atrial septum: No defect or patent foramen ovale was identified.     Assessment and Plan     #) persis AFib S/p AF ablation 08/2023 by Dr. Kennyth Amiodarone  was stopped post-procedurally Continue toprol      #) Hypercoag d/t afib CHA2DS2-VASc Score = at least 8 [CHF History: 1, HTN History: 1, Diabetes History: 0, Stroke History: 2, Vascular Disease History: 1, Age Score: 2, Gender Score: 1].  Therefore, the patient's annual risk of stroke is 10.8 %.    Stroke ppx - 2.5mg  eliquis  BID, dose appropriately reduced for Age, weight No bleeding concerns         Current medicines are reviewed at length with the patient today.   The patient does not have concerns regarding her medicines.  The following changes were made today:  none  Labs/ tests ordered today include:  Orders Placed This Encounter  Procedures   EKG 12-Lead     Disposition: Follow up with Dr. Kennyth or EP APP in 2 months    Signed, Nike Southers, NP  10/20/23  1:51 PM  Electrophysiology [facility]

## 2023-10-20 ENCOUNTER — Ambulatory Visit: Attending: Cardiology | Admitting: Cardiology

## 2023-10-20 VITALS — BP 124/64 | HR 63 | Ht 63.0 in | Wt 114.2 lb

## 2023-10-20 DIAGNOSIS — I4819 Other persistent atrial fibrillation: Secondary | ICD-10-CM

## 2023-10-20 DIAGNOSIS — D6869 Other thrombophilia: Secondary | ICD-10-CM | POA: Diagnosis not present

## 2023-10-20 NOTE — Patient Instructions (Signed)
 Medication Instructions:  The current medical regimen is effective;  continue present plan and medications as directed. Please refer to the Current Medication list given to you today.   *If you need a refill on your cardiac medications before your next appointment, please call your pharmacy*  Follow-Up: At [facility], you and your health needs are our priority.  As part of our continuing mission to provide you with exceptional heart care, our providers are all part of one team.  This team includes your primary Cardiologist (physician) and Advanced Practice Providers or APPs (Physician Assistants and Nurse Practitioners) who all work together to provide you with the care you need, when you need it.  Your next appointment:   Keep follow up appointments.   We recommend signing up for the patient portal called MyChart.  Sign up information is provided on this After Visit Summary.  MyChart is used to connect with patients for Virtual Visits (Telemedicine).  Patients are able to view lab/test results, encounter notes, upcoming appointments, etc.  Non-urgent messages can be sent to your provider as well.   To learn more about what you can do with MyChart, go to [email or web address].

## 2023-10-31 ENCOUNTER — Other Ambulatory Visit: Payer: Self-pay | Admitting: Cardiovascular Disease

## 2023-11-12 ENCOUNTER — Ambulatory Visit (INDEPENDENT_AMBULATORY_CARE_PROVIDER_SITE_OTHER): Admitting: Internal Medicine

## 2023-11-12 ENCOUNTER — Ambulatory Visit: Payer: Self-pay

## 2023-11-12 ENCOUNTER — Encounter: Payer: Self-pay | Admitting: Internal Medicine

## 2023-11-12 VITALS — BP 122/78 | HR 69 | Temp 98.5°F | Ht 63.0 in | Wt 113.0 lb

## 2023-11-12 DIAGNOSIS — J0100 Acute maxillary sinusitis, unspecified: Secondary | ICD-10-CM

## 2023-11-12 MED ORDER — BENZONATATE 200 MG PO CAPS: 200.0000 mg | ORAL_CAPSULE | Freq: Three times a day (TID) | ORAL | 0 refills | Status: AC | PRN

## 2023-11-12 MED ORDER — AMOXICILLIN-POT CLAVULANATE 875-125 MG PO TABS: 1.0000 | ORAL_TABLET | Freq: Two times a day (BID) | ORAL | 0 refills | Status: DC

## 2023-11-12 NOTE — Telephone Encounter (Signed)
 Patient was seen by Dr. Jimmy today 11/12/2023.

## 2023-11-12 NOTE — Telephone Encounter (Signed)
 Please call patient.  Triage note says appointment made but she was not seen in the clinic by myself today.  Please make sure she has been taking care of.  She should be seen today or tomorrow for her symptoms

## 2023-11-12 NOTE — Progress Notes (Signed)
 Subjective:    Patient ID: Karen Bowman, female    DOB: [birth year], 83 y.o.   MRN: [id number]  HPI Here due to respiratory illness With son  Started almost a week ago--with sore throat Some congestion later on--nasal drainage (does have sinus issues and post nasal drip) No fever, chills or sweats No SOB Thought she was improving yesterday--cough was slightly better Then a bad night last night---spasms of coughing Bringing up mucus--but still feels congestion  Using flonase  Nothing for cough Analgesic liquid tabs--some help  Current Outpatient Medications on File Prior to Visit  Medication Sig Dispense Refill   amLODipine  (NORVASC ) 2.5 MG tablet TAKE 1 TABLET BY MOUTH EVERY DAY 90 tablet 3   APPLE CIDER VINEGAR PO Take 250 mg by mouth daily.     atorvastatin  (LIPITOR ) 40 MG tablet TAKE 1 TABLET BY MOUTH DAILY AT 6 PM. 90 tablet 1   b complex vitamins capsule Take 1 capsule by mouth daily.     BORON PO Take 1 tablet by mouth daily.     Cholecalciferol 250 MCG (10000 UT) CAPS Take 10,000 Units by mouth daily.     chromium picolinate tablet Take 600 mcg by mouth daily.     CINNAMON PO Take 300 mg by mouth daily.     conjugated estrogens  (PREMARIN ) vaginal cream Place 1 Applicatorful vaginally daily. Apply 0.5mg  (pea-sized amount)  just inside the vaginal introitus with a finger-tip on  Monday, Wednesday and Friday nights. 30 g 12   CRANBERRY PO Take 4,200 mg by mouth 2 (two) times daily. With vitamin C     doxazosin  (CARDURA ) 1 MG tablet TAKE 1 TABLET BY MOUTH TWICE A DAY 180 tablet 3   ELIQUIS  2.5 MG TABS tablet TAKE 1 TABLET BY MOUTH TWICE A DAY 180 tablet 1   ezetimibe  (ZETIA ) 10 MG tablet TAKE 1 TABLET BY MOUTH EVERY DAY 90 tablet 3   fluticasone  (FLONASE ) 50 MCG/ACT nasal spray Place 1 spray into both nostrils daily.     furosemide  (LASIX ) 20 MG tablet Take 1 tablet (20 mg total) by mouth daily. Increase for 1 week to daily then return to three days a week (Patient taking  differently: Take 20 mg by mouth 4 (four) times a week.) 30 tablet 11   Glycerin-Hypromellose-PEG 400 (DRY EYE RELIEF DROPS OP) Place 1 drop into both eyes daily as needed (Dry eyes).     levothyroxine  (SYNTHROID ) 50 MCG tablet TAKE 1 TABLET BY MOUTH EVERY DAY 30 tablet 11   Magnesium 400 MG TABS Take 400 mg by mouth daily.     metoprolol  succinate (TOPROL -XL) 50 MG 24 hr tablet Take 1 tablet (50 mg total) by mouth daily. Take with or immediately following a meal. 270 tablet 3   MISC NATURAL PRODUCTS PO Take 3 tablets by mouth daily. Multivitamin-Dr. Beverly     NON FORMULARY Take 1 tablet by mouth 2 (two) times daily. Oxide nitro     NON FORMULARY Take 210 mg by mouth 2 (two) times daily.  Omega Q plus Max     NON FORMULARY Take 1 tablet by mouth daily. Boswellia     NON FORMULARY Take 1 tablet by mouth daily. Loriant health digestive complete     NON FORMULARY Take 1 tablet by mouth daily. Lidtke Glycotrol     OVER THE COUNTER MEDICATION Take 2,080 mg by mouth daily. Dandelion 2 table     OVER THE COUNTER MEDICATION Take 1 tablet by mouth 2 (two) times  daily. Blood pressure support     OVER THE COUNTER MEDICATION Take 3 tablets by mouth daily. lindex advance glucosamine supplement     OVER THE COUNTER MEDICATION Take 3 tablets by mouth daily. Devil's Claw     OVER THE COUNTER MEDICATION Take 2 tablets by mouth daily. Cardiovascular support     OVER THE COUNTER MEDICATION Take 1 tablet by mouth daily. Liver support     potassium chloride  SA (KLOR-CON  M) 10 MEQ tablet Take 1 tablet (10 mEq total) by mouth daily. 90 tablet 3   TURMERIC PO Take 1 tablet by mouth 2 (two) times daily. Curcumin Triple burn     Vitamin E 400 units TABS Take 400 Units by mouth daily.     No current facility-administered medications on file prior to visit.    Allergies  Allergen Reactions   Jardiance  [Empagliflozin ]    Ciprofloxacin     REACTION: dizzy   Hydrochlorothiazide Nausea And Vomiting   Sulfonamide  Derivatives     REACTION: nausea \\T \ vomiting   Valsartan     REACTION: angioedema - probable    Past Medical History:  Diagnosis Date   Acute heart failure (HCC)    Anxiety    Arrhythmia    Arthritis    Atrial fibrillation (HCC)    Basal cell carcinoma 11/19/2021   left forehead above brow - ED&C   Heart murmur    Hyperlipemia    Hypertension    Stroke [facility])     Past Surgical History:  Procedure Laterality Date   ABDOMINAL HYSTERECTOMY     APPENDECTOMY     ATRIAL FIBRILLATION ABLATION N/A 09/19/2023   Procedure: ATRIAL FIBRILLATION ABLATION;  Surgeon: Kennyth Chew, MD;  Location: [facility] INVASIVE CV LAB;  Service: Cardiovascular;  Laterality: N/A;   BLADDER SUSPENSION     CARDIOVERSION N/A 07/15/2023   Procedure: CARDIOVERSION;  Surgeon: Perla Evalene PARAS, MD;  Location: [facility] ORS;  Service: Cardiovascular;  Laterality: N/A;   CARDIOVERSION N/A 08/13/2023   Procedure: CARDIOVERSION;  Surgeon: Perla Evalene PARAS, MD;  Location: [facility] ORS;  Service: Cardiovascular;  Laterality: N/A;   ELECTROPHYSIOLOGIC STUDY N/A 10/18/2014   Procedure: CARDIOVERSION;  Surgeon: Aleene PARAS Passe, MD;  Location: [facility] ORS;  Service: Cardiovascular;  Laterality: N/A;   TEE WITHOUT CARDIOVERSION N/A 10/18/2014   Procedure: TRANSESOPHAGEAL ECHOCARDIOGRAM (TEE);  Surgeon: Aleene PARAS Passe, MD;  Location: [facility] ORS;  Service: Cardiovascular;  Laterality: N/A;   TONSILLECTOMY     TUMOR REMOVAL     right arm    Family History  Problem Relation Age of Onset   Osteoporosis Mother    Heart disease Mother    Stroke Mother    Cancer Father    Diabetes Maternal Aunt     Social History   Socioeconomic History   Marital status: Widowed    Spouse name: Not on file   Number of children: 2   Years of education: Not on file   Highest education level: Not on file  Occupational History   Occupation: retired [employer] operational officer  Tobacco Use   Smoking status: Never   Smokeless tobacco: Never  Vaping Use   Vaping status:  Never Used  Substance and Sexual Activity   Alcohol use: No   Drug use: No   Sexual activity: Not Currently  Other Topics Concern   Not on file  Social History Narrative   Husband passed away 2 years ago.  Lives with son, Oneil.   Social Drivers of Health  Financial Resource Strain: Low Risk  (07/25/2023)   Overall Financial Resource Strain (CARDIA)    Difficulty of Paying Living Expenses: Not hard at all  Food Insecurity: No Food Insecurity (07/25/2023)   Hunger Vital Sign    Worried About Running Out of Food in the Last Year: Never true    Ran Out of Food in the Last Year: Never true  Transportation Needs: No Transportation Needs (07/25/2023)   PRAPARE - Administrator, Civil Service (Medical): No    Lack of Transportation (Non-Medical): No  Physical Activity: Inactive (07/25/2023)   Exercise Vital Sign    Days of Exercise per Week: 0 days    Minutes of Exercise per Session: 0 min  Stress: No Stress Concern Present (07/25/2023)   [employer] of Occupational Health - Occupational Stress Questionnaire    Feeling of Stress : Only a little  Social Connections: Socially Isolated (07/25/2023)   Social Connection and Isolation Panel    Frequency of Communication with Friends and Family: Twice a week    Frequency of Social Gatherings with Friends and Family: Twice a week    Attends Religious Services: Never    Database administrator or Organizations: No    Attends Banker Meetings: Never    Marital Status: Widowed  Intimate Partner Violence: Not At Risk (07/25/2023)   Humiliation, Afraid, Rape, and Kick questionnaire    Fear of Current or Ex-Partner: No    Emotionally Abused: No    Physically Abused: No    Sexually Abused: No   Review of Systems No N/V Eating okay    Objective:   Physical Exam Constitutional:      Appearance: Normal appearance.     Comments: Some coarse cough  HENT:     Head:     Comments: Right maxillary tenderness    Right  Ear: Tympanic membrane and ear canal normal.     Left Ear: Tympanic membrane and ear canal normal.     Mouth/Throat:     Pharynx: No oropharyngeal exudate or posterior oropharyngeal erythema.  Pulmonary:     Effort: Pulmonary effort is normal.     Breath sounds: Normal breath sounds. No wheezing or rales.  Musculoskeletal:     Cervical back: Neck supple.  Lymphadenopathy:     Cervical: No cervical adenopathy.  Neurological:     Mental Status: She is alert.            Assessment & Plan:

## 2023-11-12 NOTE — Telephone Encounter (Signed)
  FYI Only or Action Required?: FYI only for provider.  Patient was last seen in primary care on 06/12/2023 by Avelina Greig BRAVO, MD.  Called Nurse Triage reporting Cough.  Symptoms began a week ago.  Interventions attempted: Nothing.  Symptoms are: unchanged.  Triage Disposition: See Physician Within 24 Hours  Patient/caregiver understands and will follow disposition?: Yes   Copied from CRM [id number]. Topic: Clinical - Red Word Triage >> Nov 12, 2023 10:23 AM Gennette ORN wrote: Red Word that prompted transfer to Nurse Triage: Patient is having a severe cough , congestion, her chest is uncomfortable when she coughs. Reason for Disposition  SEVERE coughing spells (e.g., whooping sound after coughing, vomiting after coughing)  Answer Assessment - Initial Assessment Questions 1. ONSET: When did the cough begin?      Last Thursday 2. SEVERITY: How bad is the cough today?      severe 3. SPUTUM: Describe the color of your sputum (e.g., none, dry cough; clear, white, yellow, green)     Light yellow 4. HEMOPTYSIS: Are you coughing up any blood? If Yes, ask: How much? (e.g., flecks, streaks, tablespoons, etc.)     denies 5. DIFFICULTY BREATHING: Are you having difficulty breathing? If Yes, ask: How bad is it? (e.g., mild, moderate, severe)      denies 6. FEVER: Do you have a fever? If Yes, ask: What is your temperature, how was it measured, and when did it start?     denies 7. CARDIAC HISTORY: Do you have any history of heart disease? (e.g., heart attack, congestive heart failure)      CHF, CVA 8. LUNG HISTORY: Do you have any history of lung disease?  (e.g., pulmonary embolus, asthma, emphysema)     denies 9. PE RISK FACTORS: Do you have a history of blood clots? (or: recent major surgery, recent prolonged travel, bedridden)     no 10. OTHER SYMPTOMS: Do you have any other symptoms? (e.g., runny nose, wheezing, chest pain)       Runny nose 11. PREGNANCY: Is  there any chance you are pregnant? When was your last menstrual period?       na 12. TRAVEL: Have you traveled out of the country in the last month? (e.g., travel history, exposures)       no  Protocols used: Cough - Acute Productive-A-AH

## 2023-11-12 NOTE — Assessment & Plan Note (Signed)
 Seems to be complicating prior viral infection Discussed tylenol  and not NSAID (CKD) Will treat with augmentin  875 bid x 7 days Robitussin DM and benzonatate  for cough Sleep propped up

## 2023-11-17 ENCOUNTER — Ambulatory Visit: Payer: Self-pay

## 2023-11-17 DIAGNOSIS — N952 Postmenopausal atrophic vaginitis: Secondary | ICD-10-CM | POA: Diagnosis not present

## 2023-11-17 DIAGNOSIS — D6869 Other thrombophilia: Secondary | ICD-10-CM | POA: Diagnosis not present

## 2023-11-17 NOTE — Telephone Encounter (Signed)
 Patient denies higher acuity questions. ED precautions reviewed, pt verbalized understanding.  FYI Only or Action Required?: Action required by provider: update on patient condition.  Patient was last seen in primary care on 11/12/2023 by Jimmy Charlie FERNS, MD.  Called Nurse Triage reporting No chief complaint on file..  Symptoms began several days ago.  Interventions attempted: Prescription medications: benzonatate , augmentin , Robitussin DM.  Symptoms are: gradually worsening.  Triage Disposition: See Physician Within 24 Hours (overriding Home Care)  Patient/caregiver understands and will follow disposition?: Yes  Reason for Disposition  Cough with cold symptoms (e.g., runny nose, postnasal drip, throat clearing)  Answer Assessment - Initial Assessment Questions Patient reports lack of improvement since being seen 11/12/23. States has 2 doses of augmentin  left. Reports mild improvement with robitussin DM and benzonatate . Denies higher acuity questions including shortness of breath, wheezing or lower extremity edema. ED precautions reviewed, pt verbalized understanding.   1. ONSET: When did the cough begin?      11/12/23  2. SEVERITY: How bad is the cough today?      Patient reports cough is keeping her awake at night  3. SPUTUM: Describe the color of your sputum (e.g., none, dry cough; clear, white, yellow, green)     Yellow  4. HEMOPTYSIS: Are you coughing up any blood? If Yes, ask: How much? (e.g., flecks, streaks, tablespoons, etc.)     Denies  5. DIFFICULTY BREATHING: Are you having difficulty breathing? If Yes, ask: How bad is it? (e.g., mild, moderate, severe)      Denies  6. FEVER: Do you have a fever? If Yes, ask: What is your temperature, how was it measured, and when did it start?     Denies  7. CARDIAC HISTORY: Do you have any history of heart disease? (e.g., heart attack, congestive heart failure)      CHF, Afib  8. OTHER SYMPTOMS: Do you  have any other symptoms? (e.g., runny nose, wheezing, chest pain)       Denies  Protocols used: Cough - Acute Productive-A-AH Message from [place] S sent at 11/17/2023  1:34 PM EDT  Patient has been having a bad cough ongoing. Not sure what is causing the cough had an appointment  last week with another provider. Patient started to spit up blood after visit. No symptoms as of today. Patient had annual visit with bcbs and was told about getting xray just for clearance. Please contact [phone] Oneil Clause

## 2023-11-17 NOTE — Telephone Encounter (Signed)
 Appointment scheduled with Dr. Avelina 11/18/2023 at 11:40 am.

## 2023-11-18 ENCOUNTER — Ambulatory Visit: Payer: Self-pay | Admitting: Family Medicine

## 2023-11-18 ENCOUNTER — Encounter: Payer: Self-pay | Admitting: Family Medicine

## 2023-11-18 ENCOUNTER — Ambulatory Visit (INDEPENDENT_AMBULATORY_CARE_PROVIDER_SITE_OTHER)
Admission: RE | Admit: 2023-11-18 | Discharge: 2023-11-18 | Disposition: A | Discharge status: Home / Self Care | Source: Ambulatory Visit | Attending: Family Medicine | Admitting: Family Medicine

## 2023-11-18 ENCOUNTER — Ambulatory Visit: Admitting: Family Medicine

## 2023-11-18 VITALS — BP 154/88 | HR 79 | Temp 98.2°F | Ht 63.0 in | Wt 111.4 lb

## 2023-11-18 DIAGNOSIS — I10 Essential (primary) hypertension: Secondary | ICD-10-CM | POA: Diagnosis not present

## 2023-11-18 DIAGNOSIS — J90 Pleural effusion, not elsewhere classified: Secondary | ICD-10-CM | POA: Diagnosis not present

## 2023-11-18 DIAGNOSIS — R051 Acute cough: Secondary | ICD-10-CM

## 2023-11-18 DIAGNOSIS — R918 Other nonspecific abnormal finding of lung field: Secondary | ICD-10-CM | POA: Diagnosis not present

## 2023-11-18 DIAGNOSIS — R059 Cough, unspecified: Secondary | ICD-10-CM | POA: Diagnosis not present

## 2023-11-18 DIAGNOSIS — I700 Atherosclerosis of aorta: Secondary | ICD-10-CM | POA: Diagnosis not present

## 2023-11-18 MED ORDER — PREDNISONE 20 MG PO TABS: ORAL_TABLET | ORAL | 0 refills | Status: DC

## 2023-11-18 NOTE — Patient Instructions (Signed)
 Can use plain Mucinex or Mucinex DM twice daily. No decongestant. Follow BP at home .SABRA Call if persistently high after feeling better. We will call with X-ray and lab results.

## 2023-11-18 NOTE — Progress Notes (Signed)
 Patient ID: Karen Bowman, female    DOB: [birth year], 83 y.o.   MRN: [id number]  This visit was conducted in person.  BP (!) 154/88   Pulse 79   Temp 98.2 F (36.8 C) (Temporal)   Ht 5' 3 (1.6 m)   Wt 111 lb 6 oz (50.5 kg)   LMP  (LMP Unknown)   SpO2 93%   BMI 19.73 kg/m    CC:  Chief Complaint  Patient presents with   Cough    With Chest Congestion-Follow up.  Seen by Dr. Jimmy 11/12/23 Seen by [facility] Nurse yesterday who listened your lungs and concerned about pneumonia   Nasal Drainage    Subjective:   HPI: Karen Bowman is a 83 y.o. female presenting on 11/18/2023 for Cough (With Chest Congestion-Follow up.  Seen by Dr. Jimmy 11/12/23/Seen by [facility] Nurse yesterday who listened your lungs and concerned about pneumonia) and Nasal Drainage  Seen on 11/12/2023 by Dr. Letvak for 1 week of ST, congestion  Dx with acute sinusitis...treated with Augmentin  x 10 days.   Now she reports thick nasal congestion. Yellow.  Post nasal drip  Coughing fits, productive.  Hard to sleep with cough.  No fever.  No face pain, no ear pain.  No SOB, occ wheeze. Antibiotics helped initially but now worsening.  She is feeling very tired.  [facility] Nurse... listened to lungs heard changes.    BP has been well controlled until today  BP Readings from Last 3 Encounters:  11/18/23 (!) 154/88  11/12/23 122/78  10/20/23 124/64   Comploant with meds... using lasix  daily.. no new periphearl swelling.  Relevant past medical, surgical, family and social history reviewed and updated as indicated. Interim medical history since our last visit reviewed. Allergies and medications reviewed and updated. Outpatient Medications Prior to Visit  Medication Sig Dispense Refill   amLODipine  (NORVASC ) 2.5 MG tablet TAKE 1 TABLET BY MOUTH EVERY DAY 90 tablet 3   amoxicillin -clavulanate (AUGMENTIN ) 875-125 MG tablet Take 1 tablet by mouth 2 (two) times daily. 14 tablet 0   APPLE CIDER VINEGAR PO Take 250 mg  by mouth daily.     atorvastatin  (LIPITOR ) 40 MG tablet TAKE 1 TABLET BY MOUTH DAILY AT 6 PM. 90 tablet 1   b complex vitamins capsule Take 1 capsule by mouth daily.     benzonatate  (TESSALON ) 200 MG capsule Take 1 capsule (200 mg total) by mouth 3 (three) times daily as needed for cough. 60 capsule 0   BORON PO Take 1 tablet by mouth daily.     Cholecalciferol 250 MCG (10000 UT) CAPS Take 10,000 Units by mouth daily.     chromium picolinate tablet Take 600 mcg by mouth daily.     CINNAMON PO Take 300 mg by mouth daily.     conjugated estrogens  (PREMARIN ) vaginal cream Place 1 Applicatorful vaginally daily. Apply 0.5mg  (pea-sized amount)  just inside the vaginal introitus with a finger-tip on  Monday, Wednesday and Friday nights. 30 g 12   CRANBERRY PO Take 4,200 mg by mouth 2 (two) times daily. With vitamin C     doxazosin  (CARDURA ) 1 MG tablet TAKE 1 TABLET BY MOUTH TWICE A DAY 180 tablet 3   ELIQUIS  2.5 MG TABS tablet TAKE 1 TABLET BY MOUTH TWICE A DAY 180 tablet 1   ezetimibe  (ZETIA ) 10 MG tablet TAKE 1 TABLET BY MOUTH EVERY DAY 90 tablet 3   fluticasone  (FLONASE ) 50 MCG/ACT nasal spray Place 1 spray  into both nostrils daily.     furosemide  (LASIX ) 20 MG tablet Take 20 mg by mouth 4 (four) times a week.     Glycerin-Hypromellose-PEG 400 (DRY EYE RELIEF DROPS OP) Place 1 drop into both eyes daily as needed (Dry eyes).     levothyroxine  (SYNTHROID ) 50 MCG tablet TAKE 1 TABLET BY MOUTH EVERY DAY 30 tablet 11   Magnesium 400 MG TABS Take 400 mg by mouth daily.     metoprolol  succinate (TOPROL -XL) 50 MG 24 hr tablet Take 1 tablet (50 mg total) by mouth daily. Take with or immediately following a meal. 270 tablet 3   MISC NATURAL PRODUCTS PO Take 3 tablets by mouth daily. Multivitamin-Dr. Beverly     NON FORMULARY Take 1 tablet by mouth 2 (two) times daily. Oxide nitro     NON FORMULARY Take 210 mg by mouth 2 (two) times daily.  Omega Q plus Max     NON FORMULARY Take 1 tablet by mouth daily.  Boswellia     NON FORMULARY Take 1 tablet by mouth daily. Loriant health digestive complete     NON FORMULARY Take 1 tablet by mouth daily. Lidtke Glycotrol     OVER THE COUNTER MEDICATION Take 2,080 mg by mouth daily. Dandelion 2 table     OVER THE COUNTER MEDICATION Take 1 tablet by mouth 2 (two) times daily. Blood pressure support     OVER THE COUNTER MEDICATION Take 3 tablets by mouth daily. lindex advance glucosamine supplement     OVER THE COUNTER MEDICATION Take 3 tablets by mouth daily. Devil's Claw     OVER THE COUNTER MEDICATION Take 2 tablets by mouth daily. Cardiovascular support     OVER THE COUNTER MEDICATION Take 1 tablet by mouth daily. Liver support     potassium chloride  SA (KLOR-CON  M) 10 MEQ tablet Take 1 tablet (10 mEq total) by mouth daily. 90 tablet 3   TURMERIC PO Take 1 tablet by mouth 2 (two) times daily. Curcumin Triple burn     Vitamin E 400 units TABS Take 400 Units by mouth daily.     furosemide  (LASIX ) 20 MG tablet Take 1 tablet (20 mg total) by mouth daily. Increase for 1 week to daily then return to three days a week (Patient taking differently: Take 20 mg by mouth 4 (four) times a week.) 30 tablet 11   No facility-administered medications prior to visit.     Per HPI unless specifically indicated in ROS section below Review of Systems  Constitutional:  Negative for fatigue and fever.  HENT:  Positive for congestion.   Eyes:  Negative for pain.  Respiratory:  Positive for cough. Negative for shortness of breath.   Cardiovascular:  Negative for chest pain, palpitations and leg swelling.  Gastrointestinal:  Negative for abdominal pain.  Genitourinary:  Negative for dysuria and vaginal bleeding.  Musculoskeletal:  Negative for back pain.  Neurological:  Negative for syncope, light-headedness and headaches.  Psychiatric/Behavioral:  Negative for dysphoric mood.    Objective:  BP (!) 154/88   Pulse 79   Temp 98.2 F (36.8 C) (Temporal)   Ht 5' 3 (1.6 m)    Wt 111 lb 6 oz (50.5 kg)   LMP  (LMP Unknown)   SpO2 93%   BMI 19.73 kg/m   Wt Readings from Last 3 Encounters:  11/18/23 111 lb 6 oz (50.5 kg)  11/12/23 113 lb (51.3 kg)  10/20/23 114 lb 3.2 oz (51.8 kg)  Physical Exam Constitutional:      General: She is not in acute distress.    Appearance: She is well-developed. She is not ill-appearing or toxic-appearing.  HENT:     Head: Normocephalic.     Right Ear: Hearing, tympanic membrane, ear canal and external ear normal. Tympanic membrane is not erythematous, retracted or bulging.     Left Ear: Hearing, tympanic membrane, ear canal and external ear normal. Tympanic membrane is not erythematous, retracted or bulging.     Nose: Mucosal edema and rhinorrhea present.     Right Sinus: No maxillary sinus tenderness or frontal sinus tenderness.     Left Sinus: No maxillary sinus tenderness or frontal sinus tenderness.     Mouth/Throat:     Pharynx: Uvula midline.  Eyes:     General: Lids are normal. Lids are everted, no foreign bodies appreciated.     Conjunctiva/sclera: Conjunctivae normal.     Pupils: Pupils are equal, round, and reactive to light.  Neck:     Thyroid : No thyroid  mass or thyromegaly.     Vascular: No carotid bruit.     Trachea: Trachea normal.  Cardiovascular:     Rate and Rhythm: Normal rate and regular rhythm.     Pulses: Normal pulses.     Heart sounds: Normal heart sounds, S1 normal and S2 normal. No murmur heard.    No friction rub. No gallop.  Pulmonary:     Effort: Pulmonary effort is normal. No tachypnea or respiratory distress.     Breath sounds: Normal breath sounds. No decreased breath sounds, wheezing, rhonchi or rales.  Musculoskeletal:     Cervical back: Normal range of motion and neck supple.  Skin:    General: Skin is warm and dry.     Findings: No rash.  Neurological:     Mental Status: She is alert.  Psychiatric:        Mood and Affect: Mood is not anxious or depressed.        Speech:  Speech normal.        Behavior: Behavior normal. Behavior is cooperative.        Judgment: Judgment normal.       Results for orders placed or performed in visit on 11/18/23  CBC with Differential/Platelet   Collection Time: 11/18/23  1:01 PM  Result Value Ref Range   WBC 6.1 4.0 - 10.5 K/uL   RBC 4.13 3.87 - 5.11 Mil/uL   Hemoglobin 11.9 (L) 12.0 - 15.0 g/dL   HCT 63.7 63.9 - 53.9 %   MCV 87.5 78.0 - 100.0 fl   MCHC 32.9 30.0 - 36.0 g/dL   RDW 80.8 (H) 88.4 - 84.4 %   Platelets 215.0 150.0 - 400.0 K/uL   Neutrophils Relative % 51.5 43.0 - 77.0 %   Lymphocytes Relative 32.8 12.0 - 46.0 %   Monocytes Relative 13.0 (H) 3.0 - 12.0 %   Eosinophils Relative 2.1 0.0 - 5.0 %   Basophils Relative 0.6 0.0 - 3.0 %   Neutro Abs 3.1 1.4 - 7.7 K/uL   Lymphs Abs 2.0 0.7 - 4.0 K/uL   Monocytes Absolute 0.8 0.1 - 1.0 K/uL   Eosinophils Absolute 0.1 0.0 - 0.7 K/uL   Basophils Absolute 0.0 0.0 - 0.1 K/uL  Basic Metabolic Panel   Collection Time: 11/18/23  1:01 PM  Result Value Ref Range   Sodium 140 135 - 145 mEq/L   Potassium 3.4 (L) 3.5 - 5.1 mEq/L   Chloride 104  96 - 112 mEq/L   CO2 26 19 - 32 mEq/L   Glucose, Bld 80 70 - 99 mg/dL   BUN 14 6 - 23 mg/dL   Creatinine, Ser 9.29 0.40 - 1.20 mg/dL   GFR 18.71 >39.99 mL/min   Calcium  9.1 8.4 - 10.5 mg/dL    Assessment and Plan  Acute cough Assessment & Plan: Acute Initial improvement with antibiotics but now recurring symptoms. We will evaluate with labs and chest x-ray. Can use plain Mucinex or Mucinex DM twice daily. No decongestant.    Orders: -     DG Chest 2 View; Future -     CBC with Differential/Platelet -     Basic metabolic panel with GFR  HYPERTENSION, BENIGN ESSENTIAL Assessment & Plan: Previously well-controlled Follow BP at home .SABRA Call if persistently high after feeling better. Continue current medication.  Amlodipine  10 mg daily. Cardura  1 mg daily Lopressor    full tablet daily for now     No  follow-ups on file.   Greig Ring, MD

## 2023-11-19 LAB — CBC WITH DIFFERENTIAL/PLATELET
Basophils Absolute: 0 K/uL (ref 0.0–0.1)
Basophils Relative: 0.6 % (ref 0.0–3.0)
Eosinophils Absolute: 0.1 K/uL (ref 0.0–0.7)
Eosinophils Relative: 2.1 % (ref 0.0–5.0)
HCT: 36.2 % (ref 36.0–46.0)
Hemoglobin: 11.9 g/dL — ABNORMAL LOW (ref 12.0–15.0)
Lymphocytes Relative: 32.8 % (ref 12.0–46.0)
Lymphs Abs: 2 K/uL (ref 0.7–4.0)
MCHC: 32.9 g/dL (ref 30.0–36.0)
MCV: 87.5 fl (ref 78.0–100.0)
Monocytes Absolute: 0.8 K/uL (ref 0.1–1.0)
Monocytes Relative: 13 % — ABNORMAL HIGH (ref 3.0–12.0)
Neutro Abs: 3.1 K/uL (ref 1.4–7.7)
Neutrophils Relative %: 51.5 % (ref 43.0–77.0)
Platelets: 215 K/uL (ref 150.0–400.0)
RBC: 4.13 Mil/uL (ref 3.87–5.11)
RDW: 19.1 % — ABNORMAL HIGH (ref 11.5–15.5)
WBC: 6.1 K/uL (ref 4.0–10.5)

## 2023-11-19 LAB — BASIC METABOLIC PANEL WITH GFR
BUN: 14 mg/dL (ref 6–23)
CO2: 26 meq/L (ref 19–32)
Calcium: 9.1 mg/dL (ref 8.4–10.5)
Chloride: 104 meq/L (ref 96–112)
Creatinine, Ser: 0.7 mg/dL (ref 0.40–1.20)
GFR: 81.28 mL/min (ref >60.00)
Glucose, Bld: 80 mg/dL (ref 70–99)
Potassium: 3.4 meq/L — ABNORMAL LOW (ref 3.5–5.1)
Sodium: 140 meq/L (ref 135–145)

## 2023-12-03 DIAGNOSIS — R051 Acute cough: Secondary | ICD-10-CM | POA: Insufficient documentation

## 2023-12-03 NOTE — Assessment & Plan Note (Addendum)
 Acute Initial improvement with antibiotics but now recurring symptoms. We will evaluate with labs and chest x-ray. Can use plain Mucinex or Mucinex DM twice daily. No decongestant.

## 2023-12-03 NOTE — Assessment & Plan Note (Signed)
 Previously well-controlled Follow BP at home .SABRA Call if persistently high after feeling better. Continue current medication.  Amlodipine  10 mg daily. Cardura  1 mg daily Lopressor    full tablet daily for now

## 2023-12-22 ENCOUNTER — Ambulatory Visit: Attending: Cardiology | Admitting: Cardiology

## 2023-12-22 ENCOUNTER — Ambulatory Visit

## 2023-12-22 ENCOUNTER — Encounter: Payer: Self-pay | Admitting: Cardiology

## 2023-12-22 VITALS — BP 124/60 | HR 68 | Ht 63.0 in | Wt 114.4 lb

## 2023-12-22 DIAGNOSIS — D6869 Other thrombophilia: Secondary | ICD-10-CM | POA: Diagnosis not present

## 2023-12-22 DIAGNOSIS — I4819 Other persistent atrial fibrillation: Secondary | ICD-10-CM | POA: Diagnosis not present

## 2023-12-22 DIAGNOSIS — I5020 Unspecified systolic (congestive) heart failure: Secondary | ICD-10-CM

## 2023-12-22 NOTE — Progress Notes (Signed)
 Electrophysiology Clinic Note    Date:  12/22/2023  Patient ID:  Karen Bowman, DOB 1940-11-24, MRN [id number] PCP:  Avelina Greig BRAVO, MD  Cardiologist:  Evalene Lunger, MD   Electrophysiologist:  Fonda Kitty, MD  Electrophysiology APP:  Derryck Shahan, NP     Discussed the use of AI scribe software for clinical note transcription with the patient, who gave verbal consent to proceed.   Patient Profile    Chief Complaint: AF ablation follow-up  History of Present Illness: Karen Bowman is a 83 y.o. female with PMH notable for parox AFib, atrial flutter, HFrEF, HTN, HLD, CVA; seen today for Fonda Kitty, MD for routine electrophysiology follow-up s/p Ablation.  She is s/p AF ablation w isolation of pulm veins, posterior wall on 09/19/2023 by Dr. Kitty. Amiodarone  was stopped post-procedurally.  I saw her for 1 month post-ablation appt where she was maintaining sinus rhythm.   On follow-up today, she is currently quite fatigued. She says that she spent yesterday and the day before cooking and doing laundry, and today feels drained - this is normal for her and she does not feel more fatigued than usual. She denies palpitations, but historically rarely felt her Afib. She denies chest pain, chest pressure, increased edema.  She continues to take eliquis  BID, no bleeding concerns.    Her son joins for appointment   Arrhythmia/Device History Amiodarone , stopped after ablation    ROS:  Please see the history of present illness. All other systems are reviewed and otherwise negative.    Physical Exam    VS:  BP 124/60   Pulse 68   Ht 5' 3 (1.6 m)   Wt 114 lb 6.4 oz (51.9 kg)   LMP  (LMP Unknown)   SpO2 96%   BMI 20.27 kg/m  BMI: Body mass index is 20.27 kg/m.      Wt Readings from Last 3 Encounters:  12/22/23 114 lb 6.4 oz (51.9 kg)  11/18/23 111 lb 6 oz (50.5 kg)  11/12/23 113 lb (51.3 kg)     GEN- The patient is alert and oriented x 3 today.   Lungs-  Clear to ausculation bilaterally, normal work of breathing.  Heart- Regular rate and rhythm, 2/6 murmur, no rubs or gallops Extremities- No peripheral edema, warm, dry    Studies Reviewed   Previous EP, cardiology notes.    EKG is ordered. Personal review of EKG from today shows:    EKG Interpretation Date/Time:  Monday December 22 2023 13:37:29 EDT Ventricular Rate:  68 PR Interval:  144 QRS Duration:  84 QT Interval:  444 QTC Calculation: 472 R Axis:   -7  Text Interpretation: Normal sinus rhythm Confirmed by Larna Capelle [phone]) on 12/22/2023 1:39:54 PM     Cardiac CT, 09/08/2023 1. There is normal pulmonary vein drainage into the left atrium with ostial measurements above.  2. There is no thrombus in the left atrial appendage.  3. The esophagus runs in the left atrial midline and is not in proximity to any of the pulmonary vein ostia.  4. No PFO/ASD.  5. Normal coronary origin. Right dominance.  6. CAC score of 287 which is 69th percentile for age-, race-, and sex-matched controls.  7. Aortic atherosclerosis.  8. Mild aortic valve calcification.  9. Mitral annular calcification.  TTE, 07/14/2023  1. Left ventricular ejection fraction, by estimation, is 25 to 30%. The left ventricle has severely decreased function.   2. Right ventricular systolic function is  low normal.   3. Left atrial size was severely dilated.   4. Right atrial size was severely dilated.   5. A small pericardial effusion is present.   6. The mitral valve is normal in structure. Mild mitral valve regurgitation.   7. The aortic valve is tricuspid. Aortic valve regurgitation is not visualized. Aortic valve sclerosis/calcification is present, without any evidence of aortic stenosis.   8. The inferior vena cava is dilated in size with <50% respiratory variability, suggesting right atrial pressure of 15 mmHg.   TTE, 06/28/2015 - Left ventricle: The cavity size was normal. Wall thickness was increased in a  pattern of mild LVH. Systolic function was normal. The estimated ejection fraction was in the range of 55% to 60%. Wall motion was normal; there were no regional wall motion abnormalities.  - Mitral valve: Moderately calcified annulus. Moderately thickened, moderately calcified leaflets .  - Atrial septum: No defect or patent foramen ovale was identified.     Assessment and Plan     #) persis AFib S/p AF ablation 08/2023 by Dr. Kennyth Sinus rhythm on today's EKG, query whether she is maintaining sinus with her increased fatigue after minimal activity Amiodarone  was stopped post-procedurally Continue toprol   Update 2 week zio to confirm she is maintaining sinus rhythm  #) Hypercoag d/t afib CHA2DS2-VASc Score = at least 8 [CHF History: 1, HTN History: 1, Diabetes History: 0, Stroke History: 2, Vascular Disease History: 1, Age Score: 2, Gender Score: 1].  Therefore, the patient's annual risk of stroke is 10.8 %.    Stroke ppx - 2.5mg  eliquis  BID, dose appropriately reduced for Age, weight No bleeding concerns  #) HFrEF Recently diagnosed iso afib w RVR Appears euvolemic on exam Update zio as above If maintaining sinus, update TTE Continue follow-up with Dr. Gollan      Current medicines are reviewed at length with the patient today.   The patient does not have concerns regarding her medicines.  The following changes were made today:  none  Labs/ tests ordered today include:  Orders Placed This Encounter  Procedures   LONG TERM MONITOR (3-14 DAYS)   EKG 12-Lead     Disposition: Follow up with Dr. Kennyth or EP APP PRN based on zio results  Follow-up with Dr. Gollan in ~3 months    Signed, Karen Arlen, NP  12/22/23  2:10 PM  Electrophysiology [facility]

## 2023-12-22 NOTE — Patient Instructions (Signed)
 Medication Instructions:  Your physician recommends that you continue on your current medications as directed. Please refer to the Current Medication list given to you today.  *If you need a refill on your cardiac medications before your next appointment, please call your pharmacy*  Lab Work: No labs ordered today  If you have labs (blood work) drawn today and your tests are completely normal, you will receive your results only by: MyChart Message (if you have MyChart) OR A paper copy in the mail If you have any lab test that is abnormal or we need to change your treatment, we will call you to review the results.  Testing/Procedures: Your physician has recommended that you wear a Zio monitor.   This monitor is a medical device that records the heart's electrical activity. Doctors most often use these monitors to diagnose arrhythmias. Arrhythmias are problems with the speed or rhythm of the heartbeat. The monitor is a small device applied to your chest. You can wear one while you do your normal daily activities. While wearing this monitor if you have any symptoms to push the button and record what you felt. Once you have worn this monitor for the period of time provider prescribed (Usually 14 days), you will return the monitor device in the postage paid box. Once it is returned they will download the data collected and provide us  with a report which the provider will then review and we will call you with those results. Important tips:  Avoid showering during the first 24 hours of wearing the monitor. Avoid excessive sweating to help maximize wear time. Do not submerge the device, no hot tubs, and no swimming pools. Keep any lotions or oils away from the patch. After 24 hours you may shower with the patch on. Take brief showers with your back facing the shower head.  Do not remove patch once it has been placed because that will interrupt data and decrease adhesive wear time. Push the button when  you have any symptoms and write down what you were feeling. Once you have completed wearing your monitor, remove and place into box which has postage paid and place in your outgoing mailbox.  If for some reason you have misplaced your box then call our office and we can provide another box and/or mail it off for you.     Follow-Up: At [facility], you and your health needs are our priority.  As part of our continuing mission to provide you with exceptional heart care, our providers are all part of one team.  This team includes your primary Cardiologist (physician) and Advanced Practice Providers or APPs (Physician Assistants and Nurse Practitioners) who all work together to provide you with the care you need, when you need it.  Your next appointment:   3 month(s)  Follow up  Electrophysiologist as needed  Provider:   You may see Timothy Gollan, MD or one of the following Advanced Practice Providers on your designated Care Team:   Lonni Meager, NP Lesley Maffucci, PA-C Bernardino Bring, PA-C Cadence Midway, PA-C Tylene Lunch, NP Barnie Hila, NP

## 2023-12-31 NOTE — Progress Notes (Signed)
 Karen Bowman                                          MRN: [id number]   12/31/2023   The [facility]BCI Quality Team Specialist reviewed this patient medical record for the purposes of chart review for care gap closure. The following were reviewed: chart review for care gap closure-kidney health evaluation for diabetes:eGFR  and uACR.    VBCI Quality Team

## 2024-01-10 DIAGNOSIS — I4819 Other persistent atrial fibrillation: Secondary | ICD-10-CM | POA: Diagnosis not present

## 2024-01-10 DIAGNOSIS — D6869 Other thrombophilia: Secondary | ICD-10-CM | POA: Diagnosis not present

## 2024-01-16 NOTE — Progress Notes (Signed)
 Karen Bowman                                          MRN: [id number]   01/16/2024   The VBCI Quality Team Specialist reviewed this patient medical record for the purposes of chart review for care gap closure. The following were reviewed: abstraction for care gap closure-controlling blood pressure.    VBCI Quality Team

## 2024-01-18 DIAGNOSIS — I4819 Other persistent atrial fibrillation: Secondary | ICD-10-CM | POA: Diagnosis not present

## 2024-01-18 DIAGNOSIS — D6869 Other thrombophilia: Secondary | ICD-10-CM

## 2024-01-20 ENCOUNTER — Ambulatory Visit: Payer: Self-pay | Admitting: Cardiology

## 2024-03-21 ENCOUNTER — Other Ambulatory Visit: Payer: Self-pay | Admitting: Cardiology

## 2024-03-28 NOTE — Progress Notes (Deleted)
 Cardiology Office Note  Date:  03/28/2024   ID:  Karen Bowman, DOB 11-15-1940, MRN [id number]  PCP:  Avelina Greig BRAVO, MD   No chief complaint on file.   HPI:  83 year old woman with history of  paroxysmal atrial fibrillation,  hospitalization July 5 with discharge 10/20/2014 for atrial fibrillation,  discharged on amiodarone , anticoagulation, digoxin , metoprolol .  hyperlipidemia stroke March 2017, still right leg weakness Moderate to severe bilateral proximal PCA stenoses. Mild right cavernous and supraclinoid ICA stenosis. Coronary calcification, score 287 Ejection fraction 25 to 30% in April 2025 She presents today for follow-up of her atrial fibrillation /atrial flutter  Last seen by myself in clinic May 25 Seen by EP September 2025   s/p AF ablation w isolation of pulm veins, posterior wall on 09/19/2023 by Dr. Kennyth. Amiodarone  was stopped post-procedurally.   -Follow-up Zio monitor with no significant atrial fibrillation Previously discussed updating her echocardiogram   June 13, 2023 sinus bradycardia, amiodarone  down to 100 mg Echocardiogram July 14, 2023 noted to be back in atrial fibrillation with RVR, set up for cardioversion Echo July 14, 2023 ejection fraction 25 to 30% Cardioversion July 15, 2023 Follow-up visit July 28, 2023 back in atrial fibrillation Seen by Dr. Kennyth EP Aug 05, 2023 remained in atrial fibrillation, on amiodarone , rate 130 bpm Metoprolol  succinate increased up to 50 twice daily, amiodarone  200 daily Cardioversion for atrial fibrillation performed Aug 13, 2023,  Post cardioversion bradycardia noted Scheduled to see Dr. Kennyth June 3  Today, back in atrial flutter rate 139 bpm Mild symptoms, Complaining more of her sciatica left buttock area down her left leg  Sleeping well,  Remains on amiodarone  100 mg Twice a day Metoprolol  succinate 50 Mg BID Eliquis  twice daily Denies chest pain, minimal shortness of breath on  exertion Does not know when she converted to flutter  EKG personally reviewed by myself on todays visit EKG Interpretation Date/Time:  Tuesday Aug 19 2023 13:55:26 EDT Ventricular Rate:  139 PR Interval:    QRS Duration:  90 QT Interval:  242 QTC Calculation: 368 R Axis:   26  Text Interpretation: Atrial flutter with rapid ventricular response Nonspecific ST and T wave abnormality When compared with ECG of 13-Aug-2023 07:42, rhythm has changed Confirmed by Perla Lye [id number]) on 08/19/2023 1:59:10 PM    PMD 05/28/22: atrial fib/fluuter, rate 130s TSH 22 Given amiodarone  load 400 twice daily for 1 week down to 200 twice daily   Lab Results  Component Value Date   CHOL 115 05/29/2023   HDL 49.00 05/29/2023   LDLCALC 53 05/29/2023   TRIG 65.0 05/29/2023    Other past medical hx Echo 05/2015: EF 55 to 60%  Neck CT  05/2015 IMPRESSION: 1. Moderate to severe bilateral proximal PCA stenoses. 2. Mild right cavernous and supraclinoid ICA stenosis.  Hospital March 2017, right side deficits upper extremity, lower extremity, speech deficits TPA was not given CT scan of the head and neck documenting diffuse PAD Discharged with eliquis  and aspirin  81 mg daily She has completed rehabilitation, has been home for approximately one month, Reports improving deficits, able to floss her teeth, brush her hair,  graduated to a cane from a walker Denies any tachycardia or palpitations concerning for arrhythmia   Notes indicate TEE with cardioversion 10/18/2014 which was unsuccessful    PMH:   has a past medical history of Acute heart failure (HCC), Anxiety, Arrhythmia, Arthritis, Atrial fibrillation (HCC), Basal cell carcinoma (11/19/2021), Heart murmur, Hyperlipemia, Hypertension, and Stroke (  HCC).  PSH:    Past Surgical History:  Procedure Laterality Date   ABDOMINAL HYSTERECTOMY     APPENDECTOMY     ATRIAL FIBRILLATION ABLATION N/A 09/19/2023   Procedure: ATRIAL FIBRILLATION  ABLATION;  Surgeon: Kennyth Chew, MD;  Location: [facility] INVASIVE CV LAB;  Service: Cardiovascular;  Laterality: N/A;   BLADDER SUSPENSION     CARDIOVERSION N/A 07/15/2023   Procedure: CARDIOVERSION;  Surgeon: Perla Evalene PARAS, MD;  Location: [facility] ORS;  Service: Cardiovascular;  Laterality: N/A;   CARDIOVERSION N/A 08/13/2023   Procedure: CARDIOVERSION;  Surgeon: Perla Evalene PARAS, MD;  Location: [facility] ORS;  Service: Cardiovascular;  Laterality: N/A;   ELECTROPHYSIOLOGIC STUDY N/A 10/18/2014   Procedure: CARDIOVERSION;  Surgeon: Aleene PARAS Passe, MD;  Location: [facility] ORS;  Service: Cardiovascular;  Laterality: N/A;   TEE WITHOUT CARDIOVERSION N/A 10/18/2014   Procedure: TRANSESOPHAGEAL ECHOCARDIOGRAM (TEE);  Surgeon: Aleene PARAS Passe, MD;  Location: [facility] ORS;  Service: Cardiovascular;  Laterality: N/A;   TONSILLECTOMY     TUMOR REMOVAL     right arm    Current Outpatient Medications  Medication Sig Dispense Refill   amLODipine  (NORVASC ) 2.5 MG tablet TAKE 1 TABLET BY MOUTH EVERY DAY 90 tablet 3   amoxicillin -clavulanate (AUGMENTIN ) 875-125 MG tablet Take 1 tablet by mouth 2 (two) times daily. (Patient not taking: Reported on 12/22/2023) 14 tablet 0   APPLE CIDER VINEGAR PO Take 250 mg by mouth daily.     atorvastatin  (LIPITOR ) 40 MG tablet TAKE 1 TABLET BY MOUTH DAILY AT 6 PM. 90 tablet 0   b complex vitamins capsule Take 1 capsule by mouth daily.     benzonatate  (TESSALON ) 200 MG capsule Take 1 capsule (200 mg total) by mouth 3 (three) times daily as needed for cough. 60 capsule 0   BORON PO Take 1 tablet by mouth daily.     Cholecalciferol 250 MCG (10000 UT) CAPS Take 10,000 Units by mouth daily.     chromium picolinate tablet Take 600 mcg by mouth daily.     CINNAMON PO Take 300 mg by mouth daily.     conjugated estrogens  (PREMARIN ) vaginal cream Place 1 Applicatorful vaginally daily. Apply 0.5mg  (pea-sized amount)  just inside the vaginal introitus with a finger-tip on  Monday, Wednesday and  Friday nights. 30 g 12   CRANBERRY PO Take 4,200 mg by mouth 2 (two) times daily. With vitamin C     doxazosin  (CARDURA ) 1 MG tablet TAKE 1 TABLET BY MOUTH TWICE A DAY 180 tablet 3   ELIQUIS  2.5 MG TABS tablet TAKE 1 TABLET BY MOUTH TWICE A DAY 180 tablet 1   ezetimibe  (ZETIA ) 10 MG tablet TAKE 1 TABLET BY MOUTH EVERY DAY 90 tablet 3   fluticasone  (FLONASE ) 50 MCG/ACT nasal spray Place 1 spray into both nostrils daily.     furosemide  (LASIX ) 20 MG tablet Take 20 mg by mouth 4 (four) times a week.     Glycerin-Hypromellose-PEG 400 (DRY EYE RELIEF DROPS OP) Place 1 drop into both eyes daily as needed (Dry eyes).     levothyroxine  (SYNTHROID ) 50 MCG tablet TAKE 1 TABLET BY MOUTH EVERY DAY 30 tablet 11   Magnesium 400 MG TABS Take 400 mg by mouth daily.     metoprolol  succinate (TOPROL -XL) 50 MG 24 hr tablet Take 1 tablet (50 mg total) by mouth daily. Take with or immediately following a meal. 270 tablet 3   MISC NATURAL PRODUCTS PO Take 3 tablets by mouth daily. Multivitamin-Dr. Sinatra  NON FORMULARY Take 1 tablet by mouth 2 (two) times daily. Oxide nitro     NON FORMULARY Take 210 mg by mouth 2 (two) times daily.  Omega Q plus Max     NON FORMULARY Take 1 tablet by mouth daily. [place]     NON FORMULARY Take 1 tablet by mouth daily. [employer] health digestive complete     NON FORMULARY Take 1 tablet by mouth daily. Lidtke Glycotrol     OVER THE COUNTER MEDICATION Take 2,080 mg by mouth daily. Dandelion 2 table     OVER THE COUNTER MEDICATION Take 1 tablet by mouth 2 (two) times daily. Blood pressure support     OVER THE COUNTER MEDICATION Take 3 tablets by mouth daily. lindex advance glucosamine supplement     OVER THE COUNTER MEDICATION Take 3 tablets by mouth daily. Devil's Claw     OVER THE COUNTER MEDICATION Take 2 tablets by mouth daily. Cardiovascular support     OVER THE COUNTER MEDICATION Take 1 tablet by mouth daily. Liver support     potassium chloride  SA (KLOR-CON  M) 10 MEQ  tablet Take 1 tablet (10 mEq total) by mouth daily. 90 tablet 3   predniSONE  (DELTASONE ) 20 MG tablet 3 tabs by mouth daily x 3 days, then 2 tabs by mouth daily x 2 days then 1 tab by mouth daily x 2 days (Patient not taking: Reported on 12/22/2023) 15 tablet 0   TURMERIC PO Take 1 tablet by mouth 2 (two) times daily. Curcumin Triple burn     Vitamin E 400 units TABS Take 400 Units by mouth daily.     No current facility-administered medications for this visit.    Allergies:   Jardiance  [empagliflozin ], Ciprofloxacin, Hydrochlorothiazide, Sulfonamide derivatives, and Valsartan   Social History:  The patient  reports that she has never smoked. She has never used smokeless tobacco. She reports that she does not drink alcohol and does not use drugs.   Family History:   family history includes Cancer in her father; Diabetes in her maternal aunt; Heart disease in her mother; Osteoporosis in her mother; Stroke in her mother.    Review of Systems: Review of Systems  Constitutional:  Positive for malaise/fatigue.  HENT: Negative.    Respiratory: Negative.    Cardiovascular: Negative.   Gastrointestinal: Negative.   Musculoskeletal: Negative.   Neurological: Negative.   Psychiatric/Behavioral: Negative.    All other systems reviewed and are negative.  PHYSICAL EXAM: VS:  LMP  (LMP Unknown)  , BMI There is no height or weight on file to calculate BMI. Constitutional:  oriented to person, place, and time. No distress.  HENT:  Head: Grossly normal Eyes:  no discharge. No scleral icterus.  Neck: No JVD, no carotid bruits  Cardiovascular: Irregularly irregular, tachycardic, no murmurs appreciated Pulmonary/Chest: Clear to auscultation bilaterally, no wheezes or rails Abdominal: Soft.  no distension.  no tenderness.  Musculoskeletal: Normal range of motion Neurological:  normal muscle tone. Coordination normal. No atrophy Skin: Skin warm and dry Psychiatric: normal affect, pleasant  Recent  Labs: 05/29/2023: ALT 41 06/12/2023: Pro B Natriuretic peptide (BNP) 873.0 07/03/2023: TSH 4.77 11/18/2023: BUN 14; Creatinine, Ser 0.70; Hemoglobin 11.9; Platelets 215.0; Potassium 3.4; Sodium 140    Lipid Panel Lab Results  Component Value Date   CHOL 115 05/29/2023   HDL 49.00 05/29/2023   LDLCALC 53 05/29/2023   TRIG 65.0 05/29/2023    Wt Readings from Last 3 Encounters:  12/22/23 114 lb 6.4 oz (51.9  kg)  11/18/23 111 lb 6 oz (50.5 kg)  11/12/23 113 lb (51.3 kg)      ASSESSMENT AND PLAN:  Paroxysmal atrial fibrillation (HCC) -  Recent cardioversion, with postconversion bradycardia lasting quite some time, approximately 1 hour  In atrial flutter today rate 130, Mild symptoms Continue amiodarone  100 twice daily metoprolol  succinate 50 twice daily - We have reached out to EP to move up her appointment, currently scheduled June 3 - Could consider ablation versus AV node ablation with pacer  - Case discussed with Dr. Kennyth, he will see her may 27th at 820 in the morning  Leg edema Remains on Lasix  daily Appears euvolemic, very mild shortness of breath on exertion  HTN Blood pressure stable, orthostatics negative  Fall Prior history of falls, none recently, stressed importance of being careful, use a cane or walker if needed  HYPERLIPIDEMIA Continue cholesterol medication  Cerebrovascular accident (CVA) due to thrombosis of left middle cerebral artery (HCC) Prior history of stroke, paroxysmal atrial fibrillation Known cerebrovascular disease  Eliquis  2.5 twice daily age, weight For frequent falls may need to hold Eliquis   Gait instability Prior history of falls  Adjustment disorder Loss of husband 2022, lives with son   No orders of the defined types were placed in this encounter.   Signed, Velinda Lunger, M.D., Ph.D. 03/28/2024  [facility] [place], [place] [phone]

## 2024-03-29 ENCOUNTER — Ambulatory Visit: Admitting: Cardiovascular Disease

## 2024-03-29 DIAGNOSIS — J90 Pleural effusion, not elsewhere classified: Secondary | ICD-10-CM

## 2024-03-29 DIAGNOSIS — I63312 Cerebral infarction due to thrombosis of left middle cerebral artery: Secondary | ICD-10-CM

## 2024-03-29 DIAGNOSIS — I4819 Other persistent atrial fibrillation: Secondary | ICD-10-CM

## 2024-03-29 DIAGNOSIS — I5020 Unspecified systolic (congestive) heart failure: Secondary | ICD-10-CM

## 2024-03-29 DIAGNOSIS — R600 Localized edema: Secondary | ICD-10-CM

## 2024-03-29 DIAGNOSIS — I5022 Chronic systolic (congestive) heart failure: Secondary | ICD-10-CM

## 2024-03-29 DIAGNOSIS — I480 Paroxysmal atrial fibrillation: Secondary | ICD-10-CM

## 2024-04-08 ENCOUNTER — Ambulatory Visit: Payer: Self-pay

## 2024-04-08 NOTE — Telephone Encounter (Signed)
 FYI Only or Action Required?: FYI only for provider: appointment scheduled on 1/9.  Patient was last seen in primary care on 11/18/2023 by Karen Greig BRAVO, MD.  Called Nurse Triage reporting Cough.  Symptoms began 2 weeks ago.  Interventions attempted: OTC medications: mucinex.  Symptoms are: gradually improving.  Triage Disposition: Home Care, See PCP When Office is Open (Within 3 Days)  Patient/caregiver understands and will follow disposition?:    Copied from CRM #[id number]. Topic: Clinical - Red Word Triage >> Apr 08, 2024 12:14 PM Deaijah H wrote: Red Word that prompted transfer to Nurse Triage: Cold .SABRA Chest congestion, yellow mucus / loss weight Reason for Disposition  Cough  Answer Assessment - Initial Assessment Questions 1. ONSET: When did the cough begin?      Several weeks 2. SEVERITY: How bad is the cough today?      mild 3. SPUTUM: Describe the color of your sputum (e.g., none, dry cough; clear, white, yellow, green)     denies 4. HEMOPTYSIS: Are you coughing up any blood? If Yes, ask: How much? (e.g., flecks, streaks, tablespoons, etc.)     denies 5. DIFFICULTY BREATHING: Are you having difficulty breathing? If Yes, ask: How bad is it? (e.g., mild, moderate, severe)      intermittently 6. FEVER: Do you have a fever? If Yes, ask: What is your temperature, how was it measured, and when did it start?     denies 7. CARDIAC HISTORY: Do you have any history of heart disease? (e.g., heart attack, congestive heart failure)      no 8. LUNG HISTORY: Do you have any history of lung disease?  (e.g., pulmonary embolus, asthma, emphysema)     no 9. PE RISK FACTORS: Do you have a history of blood clots? (or: recent major surgery, recent prolonged travel, bedridden)     no 10. OTHER SYMPTOMS: Do you have any other symptoms? (e.g., runny nose, wheezing, chest pain)       Runny nose  Protocols used: Cough - Acute Non-Productive-A-AH

## 2024-04-08 NOTE — Telephone Encounter (Signed)
 Spoke with Ms. Birkland and scheduled her to see Dr. Avelina on 04/10/23 at 3:00 pm.

## 2024-04-09 ENCOUNTER — Telehealth

## 2024-04-09 ENCOUNTER — Ambulatory Visit (INDEPENDENT_AMBULATORY_CARE_PROVIDER_SITE_OTHER): Admitting: Family Medicine

## 2024-04-09 VITALS — BP 140/78 | HR 68 | Temp 98.2°F | Ht 63.0 in | Wt 112.5 lb

## 2024-04-09 DIAGNOSIS — J0100 Acute maxillary sinusitis, unspecified: Secondary | ICD-10-CM | POA: Diagnosis not present

## 2024-04-09 MED ORDER — AMOXICILLIN 500 MG PO CAPS: 1000.0000 mg | ORAL_CAPSULE | Freq: Two times a day (BID) | ORAL | 0 refills | Status: AC

## 2024-04-09 NOTE — Progress Notes (Signed)
 "   Patient ID: Karen Bowman, female    DOB: 20-Dec-1940, 84 y.o.   MRN: [id number]  This visit was conducted in person.  BP (!) 140/78   Pulse 68   Temp 98.2 F (36.8 C) (Temporal)   Ht 5' 3 (1.6 m)   Wt 112 lb 8 oz (51 kg)   LMP  (LMP Unknown)   SpO2 97%   BMI 19.93 kg/m    CC:  Chief Complaint  Patient presents with   Cough    Since 03/27/24    Subjective:   HPI: Karen Bowman is a 84 y.o. female presenting on 04/09/2024 for Cough (Since 03/27/24)   Date of onset:  04/03/2023 Initial symptoms included  nasal congestion, fever, fatigue and SOB Symptoms progressed to productive cough,  darker color  IMproved SOB and cough.. mainly sinus at this point.  Sinus pressure and pain, no ear pain,    No SOB, no wheeze.  No fever   Sick contacts:  son with bad cold as well. COVID testing:   none     She has tried to treat with  Mucinex   Drinking liquids.   No history of chronic lung disease such as asthma or COPD. Non-smoker.  Hx of afib, CHF       Relevant past medical, surgical, family and social history reviewed and updated as indicated. Interim medical history since our last visit reviewed. Allergies and medications reviewed and updated. Outpatient Medications Prior to Visit  Medication Sig Dispense Refill   amLODipine  (NORVASC ) 2.5 MG tablet TAKE 1 TABLET BY MOUTH EVERY DAY 90 tablet 3   APPLE CIDER VINEGAR PO Take 250 mg by mouth daily.     atorvastatin  (LIPITOR ) 40 MG tablet TAKE 1 TABLET BY MOUTH DAILY AT 6 PM. 90 tablet 0   b complex vitamins capsule Take 1 capsule by mouth daily.     benzonatate  (TESSALON ) 200 MG capsule Take 1 capsule (200 mg total) by mouth 3 (three) times daily as needed for cough. 60 capsule 0   BORON PO Take 1 tablet by mouth daily.     chromium picolinate tablet Take 600 mcg by mouth daily.     CINNAMON PO Take 300 mg by mouth daily.     conjugated estrogens  (PREMARIN ) vaginal cream Place 1 Applicatorful vaginally daily. Apply  0.5mg  (pea-sized amount)  just inside the vaginal introitus with a finger-tip on  Monday, Wednesday and Friday nights. 30 g 12   CRANBERRY PO Take 4,200 mg by mouth 2 (two) times daily. With vitamin C     doxazosin  (CARDURA ) 1 MG tablet TAKE 1 TABLET BY MOUTH TWICE A DAY 180 tablet 3   ELIQUIS  2.5 MG TABS tablet TAKE 1 TABLET BY MOUTH TWICE A DAY 180 tablet 1   ezetimibe  (ZETIA ) 10 MG tablet TAKE 1 TABLET BY MOUTH EVERY DAY 90 tablet 3   fluticasone  (FLONASE ) 50 MCG/ACT nasal spray Place 1 spray into both nostrils daily.     furosemide  (LASIX ) 20 MG tablet Take 20 mg by mouth 4 (four) times a week.     Glycerin-Hypromellose-PEG 400 (DRY EYE RELIEF DROPS OP) Place 1 drop into both eyes daily as needed (Dry eyes).     levothyroxine  (SYNTHROID ) 50 MCG tablet TAKE 1 TABLET BY MOUTH EVERY DAY 30 tablet 11   Magnesium 400 MG TABS Take 400 mg by mouth daily.     metoprolol  succinate (TOPROL -XL) 50 MG 24 hr tablet Take 1 tablet (50 mg  total) by mouth daily. Take with or immediately following a meal. 270 tablet 3   MISC NATURAL PRODUCTS PO Take 3 tablets by mouth daily. Multivitamin-Dr. Beverly     NON FORMULARY Take 1 tablet by mouth 2 (two) times daily. Oxide nitro     NON FORMULARY Take 210 mg by mouth 2 (two) times daily.  Omega Q plus Max     NON FORMULARY Take 1 tablet by mouth daily. [place]     NON FORMULARY Take 1 tablet by mouth daily. [employer] health digestive complete     NON FORMULARY Take 1 tablet by mouth daily. Lidtke Glycotrol     OVER THE COUNTER MEDICATION Take 2,080 mg by mouth daily. Dandelion 2 table     OVER THE COUNTER MEDICATION Take 1 tablet by mouth 2 (two) times daily. Blood pressure support     OVER THE COUNTER MEDICATION Take 3 tablets by mouth daily. lindex advance glucosamine supplement     OVER THE COUNTER MEDICATION Take 3 tablets by mouth daily. Devil's Claw     OVER THE COUNTER MEDICATION Take 2 tablets by mouth daily. Cardiovascular support     OVER THE COUNTER  MEDICATION Take 1 tablet by mouth daily. Liver support     potassium chloride  SA (KLOR-CON  M) 10 MEQ tablet Take 1 tablet (10 mEq total) by mouth daily. 90 tablet 3   TURMERIC PO Take 1 tablet by mouth 2 (two) times daily. Curcumin Triple burn     Vitamin E 400 units TABS Take 400 Units by mouth daily.     amoxicillin -clavulanate (AUGMENTIN ) 875-125 MG tablet Take 1 tablet by mouth 2 (two) times daily. (Patient not taking: Reported on 12/22/2023) 14 tablet 0   Cholecalciferol 250 MCG (10000 UT) CAPS Take 10,000 Units by mouth daily.     predniSONE  (DELTASONE ) 20 MG tablet 3 tabs by mouth daily x 3 days, then 2 tabs by mouth daily x 2 days then 1 tab by mouth daily x 2 days (Patient not taking: Reported on 12/22/2023) 15 tablet 0   No facility-administered medications prior to visit.     Per HPI unless specifically indicated in ROS section below Review of Systems  Constitutional:  Negative for fatigue and fever.  HENT:  Negative for congestion.   Eyes:  Negative for pain.  Respiratory:  Negative for cough and shortness of breath.   Cardiovascular:  Negative for chest pain, palpitations and leg swelling.  Gastrointestinal:  Negative for abdominal pain.  Genitourinary:  Negative for dysuria and vaginal bleeding.  Musculoskeletal:  Negative for back pain.  Neurological:  Negative for syncope, light-headedness and headaches.  Psychiatric/Behavioral:  Negative for dysphoric mood.    Objective:  BP (!) 140/78   Pulse 68   Temp 98.2 F (36.8 C) (Temporal)   Ht 5' 3 (1.6 m)   Wt 112 lb 8 oz (51 kg)   LMP  (LMP Unknown)   SpO2 97%   BMI 19.93 kg/m   Wt Readings from Last 3 Encounters:  04/09/24 112 lb 8 oz (51 kg)  12/22/23 114 lb 6.4 oz (51.9 kg)  11/18/23 111 lb 6 oz (50.5 kg)      Physical Exam Constitutional:      General: She is not in acute distress.    Appearance: Normal appearance. She is well-developed. She is not ill-appearing or toxic-appearing.  HENT:     Head:  Normocephalic.     Right Ear: Hearing, tympanic membrane, ear canal and external ear normal.  Tympanic membrane is not erythematous, retracted or bulging.     Left Ear: Hearing, tympanic membrane, ear canal and external ear normal. Tympanic membrane is not erythematous, retracted or bulging.     Nose: Mucosal edema and congestion present. No rhinorrhea.     Right Sinus: Frontal sinus tenderness present. No maxillary sinus tenderness.     Left Sinus: Frontal sinus tenderness present. No maxillary sinus tenderness.     Mouth/Throat:     Pharynx: Uvula midline.  Eyes:     General: Lids are normal. Lids are everted, no foreign bodies appreciated.     Conjunctiva/sclera: Conjunctivae normal.     Pupils: Pupils are equal, round, and reactive to light.  Neck:     Thyroid : No thyroid  mass or thyromegaly.     Vascular: No carotid bruit.     Trachea: Trachea normal.  Cardiovascular:     Rate and Rhythm: Normal rate and regular rhythm.     Pulses: Normal pulses.     Heart sounds: Normal heart sounds, S1 normal and S2 normal. No murmur heard.    No friction rub. No gallop.  Pulmonary:     Effort: Pulmonary effort is normal. No tachypnea or respiratory distress.     Breath sounds: Normal breath sounds. No decreased breath sounds, wheezing, rhonchi or rales.  Abdominal:     General: Bowel sounds are normal.     Palpations: Abdomen is soft.     Tenderness: There is no abdominal tenderness.  Musculoskeletal:     Cervical back: Normal range of motion and neck supple.  Skin:    General: Skin is warm and dry.     Findings: No rash.  Neurological:     Mental Status: She is alert.  Psychiatric:        Mood and Affect: Mood is not anxious or depressed.        Speech: Speech normal.        Behavior: Behavior normal. Behavior is cooperative.        Thought Content: Thought content normal.        Judgment: Judgment normal.       Results for orders placed or performed in visit on 11/18/23  CBC with  Differential/Platelet   Collection Time: 11/18/23  1:01 PM  Result Value Ref Range   WBC 6.1 4.0 - 10.5 K/uL   RBC 4.13 3.87 - 5.11 Mil/uL   Hemoglobin 11.9 (L) 12.0 - 15.0 g/dL   HCT 63.7 63.9 - 53.9 %   MCV 87.5 78.0 - 100.0 fl   MCHC 32.9 30.0 - 36.0 g/dL   RDW 80.8 (H) 88.4 - 84.4 %   Platelets 215.0 150.0 - 400.0 K/uL   Neutrophils Relative % 51.5 43.0 - 77.0 %   Lymphocytes Relative 32.8 12.0 - 46.0 %   Monocytes Relative 13.0 (H) 3.0 - 12.0 %   Eosinophils Relative 2.1 0.0 - 5.0 %   Basophils Relative 0.6 0.0 - 3.0 %   Neutro Abs 3.1 1.4 - 7.7 K/uL   Lymphs Abs 2.0 0.7 - 4.0 K/uL   Monocytes Absolute 0.8 0.1 - 1.0 K/uL   Eosinophils Absolute 0.1 0.0 - 0.7 K/uL   Basophils Absolute 0.0 0.0 - 0.1 K/uL  Basic Metabolic Panel   Collection Time: 11/18/23  1:01 PM  Result Value Ref Range   Sodium 140 135 - 145 mEq/L   Potassium 3.4 (L) 3.5 - 5.1 mEq/L   Chloride 104 96 - 112 mEq/L   CO2 26  19 - 32 mEq/L   Glucose, Bld 80 70 - 99 mg/dL   BUN 14 6 - 23 mg/dL   Creatinine, Ser 9.29 0.40 - 1.20 mg/dL   GFR 18.71 >39.99 mL/min   Calcium  9.1 8.4 - 10.5 mg/dL    Assessment and Plan  Acute non-recurrent maxillary sinusitis Assessment & Plan: Acute, most likely initial viral infection (given so severe could have been influenza) now improved some but with significant facial pain and pressure as well as nasal congestion with darkened mucus. Will cover for bacterial superinfection with amoxicillin  500 mg 2 tablets twice daily x 10 days.  She will continue Flonase  2 sprays per nostril daily and add nasal saline irrigation.  She can also continue Mucinex twice daily. No red flags and normal lung exam no indication for imaging at this time.  Return and ER precautions provided.   Other orders -     Amoxicillin ; Take 2 capsules (1,000 mg total) by mouth 2 (two) times daily.  Dispense: 40 capsule; Refill: 0    No follow-ups on file.   Greig Ring, MD  "

## 2024-04-09 NOTE — Assessment & Plan Note (Signed)
 Acute, most likely initial viral infection (given so severe could have been influenza) now improved some but with significant facial pain and pressure as well as nasal congestion with darkened mucus. Will cover for bacterial superinfection with amoxicillin  500 mg 2 tablets twice daily x 10 days.  She will continue Flonase  2 sprays per nostril daily and add nasal saline irrigation.  She can also continue Mucinex twice daily. No red flags and normal lung exam no indication for imaging at this time.  Return and ER precautions provided.

## 2024-05-10 ENCOUNTER — Ambulatory Visit: Admitting: Cardiovascular Disease

## 2024-07-26 ENCOUNTER — Ambulatory Visit

## 2024-08-05 ENCOUNTER — Ambulatory Visit: Admitting: Urology

## 2024-08-17 ENCOUNTER — Ambulatory Visit: Admitting: Dermatology

## 2024-08-24 ENCOUNTER — Ambulatory Visit: Admitting: Dermatology
